# Patient Record
Sex: Male | Born: 1950
Health system: Southern US, Community
[De-identification: ages and names within clinical notes are randomized; demographics above are authoritative.]

## PROBLEM LIST (undated history)

## (undated) DIAGNOSIS — N3941 Urge incontinence: Secondary | ICD-10-CM

## (undated) DIAGNOSIS — E291 Testicular hypofunction: Secondary | ICD-10-CM

## (undated) DIAGNOSIS — G35 Multiple sclerosis: Secondary | ICD-10-CM

## (undated) DIAGNOSIS — N209 Urinary calculus, unspecified: Secondary | ICD-10-CM

## (undated) DIAGNOSIS — R399 Unspecified symptoms and signs involving the genitourinary system: Secondary | ICD-10-CM

## (undated) DIAGNOSIS — K219 Gastro-esophageal reflux disease without esophagitis: Secondary | ICD-10-CM

## (undated) HISTORY — DX: Gastro-esophageal reflux disease without esophagitis: K21.9

## (undated) HISTORY — DX: Urinary calculus, unspecified: N20.9

## (undated) HISTORY — DX: Unspecified symptoms and signs involving the genitourinary system: R39.9

## (undated) HISTORY — PX: VASECTOMY: SHX75

## (undated) HISTORY — DX: Urge incontinence: N39.41

## (undated) HISTORY — PX: APPENDECTOMY: SHX54

## (undated) HISTORY — DX: Testicular hypofunction: E29.1

---

## 2003-07-04 ENCOUNTER — Ambulatory Visit (HOSPITAL_COMMUNITY): Admission: RE | Admit: 2003-07-04 | Discharge: 2003-07-04 | Payer: Self-pay | Admitting: Gastroenterology

## 2003-07-04 ENCOUNTER — Encounter (INDEPENDENT_AMBULATORY_CARE_PROVIDER_SITE_OTHER): Payer: Self-pay

## 2003-12-24 DIAGNOSIS — G35 Multiple sclerosis: Secondary | ICD-10-CM

## 2003-12-24 HISTORY — DX: Multiple sclerosis: G35

## 2004-01-12 ENCOUNTER — Encounter: Admission: RE | Admit: 2004-01-12 | Discharge: 2004-01-12 | Payer: Self-pay | Admitting: Family Medicine

## 2004-04-20 ENCOUNTER — Encounter: Admission: RE | Admit: 2004-04-20 | Discharge: 2004-04-20 | Payer: Self-pay | Admitting: Psychiatry

## 2006-02-27 ENCOUNTER — Encounter: Payer: Self-pay | Admitting: Emergency Medicine

## 2006-02-28 ENCOUNTER — Observation Stay (HOSPITAL_COMMUNITY): Admission: EM | Admit: 2006-02-28 | Discharge: 2006-03-01 | Payer: Self-pay | Admitting: Internal Medicine

## 2006-05-15 ENCOUNTER — Encounter: Admission: RE | Admit: 2006-05-15 | Discharge: 2006-05-15 | Payer: Self-pay | Admitting: Orthopedic Surgery

## 2006-08-22 ENCOUNTER — Encounter: Admission: RE | Admit: 2006-08-22 | Discharge: 2006-08-22 | Payer: Self-pay | Admitting: Psychiatry

## 2007-07-24 ENCOUNTER — Ambulatory Visit: Payer: Self-pay | Admitting: Family Medicine

## 2007-07-24 ENCOUNTER — Telehealth (INDEPENDENT_AMBULATORY_CARE_PROVIDER_SITE_OTHER): Payer: Self-pay | Admitting: *Deleted

## 2007-07-24 DIAGNOSIS — R3915 Urgency of urination: Secondary | ICD-10-CM

## 2007-07-24 DIAGNOSIS — G35 Multiple sclerosis: Secondary | ICD-10-CM

## 2007-07-24 LAB — CONVERTED CEMR LAB
Cholesterol: 215 mg/dL (ref 0–200)
Direct LDL: 161.1 mg/dL
Glucose, Bld: 105 mg/dL — ABNORMAL HIGH (ref 70–99)
HDL: 33.3 mg/dL — ABNORMAL LOW (ref >39.0)
Total CHOL/HDL Ratio: 6.5
Triglycerides: 90 mg/dL (ref 0–149)
VLDL: 18 mg/dL (ref 0–40)

## 2007-08-31 ENCOUNTER — Encounter (INDEPENDENT_AMBULATORY_CARE_PROVIDER_SITE_OTHER): Payer: Self-pay | Admitting: Family Medicine

## 2007-09-23 ENCOUNTER — Encounter (INDEPENDENT_AMBULATORY_CARE_PROVIDER_SITE_OTHER): Payer: Self-pay | Admitting: Family Medicine

## 2007-10-12 ENCOUNTER — Encounter (INDEPENDENT_AMBULATORY_CARE_PROVIDER_SITE_OTHER): Payer: Self-pay | Admitting: Family Medicine

## 2007-10-16 ENCOUNTER — Encounter (INDEPENDENT_AMBULATORY_CARE_PROVIDER_SITE_OTHER): Payer: Self-pay | Admitting: Family Medicine

## 2007-10-25 ENCOUNTER — Encounter: Admission: RE | Admit: 2007-10-25 | Discharge: 2007-10-25 | Payer: Self-pay | Admitting: Orthopedic Surgery

## 2007-11-11 ENCOUNTER — Encounter (INDEPENDENT_AMBULATORY_CARE_PROVIDER_SITE_OTHER): Payer: Self-pay | Admitting: Family Medicine

## 2008-03-03 ENCOUNTER — Encounter (INDEPENDENT_AMBULATORY_CARE_PROVIDER_SITE_OTHER): Payer: Self-pay | Admitting: Family Medicine

## 2008-04-05 ENCOUNTER — Encounter: Payer: Self-pay | Admitting: Internal Medicine

## 2008-06-06 ENCOUNTER — Encounter: Payer: Self-pay | Admitting: Internal Medicine

## 2008-10-04 ENCOUNTER — Encounter: Payer: Self-pay | Admitting: Internal Medicine

## 2009-01-05 ENCOUNTER — Encounter: Payer: Self-pay | Admitting: Internal Medicine

## 2009-01-12 ENCOUNTER — Encounter: Admission: RE | Admit: 2009-01-12 | Discharge: 2009-01-12 | Payer: Self-pay | Admitting: Psychiatry

## 2009-01-19 ENCOUNTER — Encounter: Admission: RE | Admit: 2009-01-19 | Discharge: 2009-01-19 | Payer: Self-pay | Admitting: Psychiatry

## 2009-04-11 ENCOUNTER — Encounter: Payer: Self-pay | Admitting: Internal Medicine

## 2009-08-09 ENCOUNTER — Emergency Department (HOSPITAL_BASED_OUTPATIENT_CLINIC_OR_DEPARTMENT_OTHER): Admission: EM | Admit: 2009-08-09 | Discharge: 2009-08-09 | Payer: Self-pay | Admitting: Emergency Medicine

## 2009-08-09 ENCOUNTER — Ambulatory Visit: Payer: Self-pay | Admitting: Diagnostic Radiology

## 2009-12-23 DIAGNOSIS — N209 Urinary calculus, unspecified: Secondary | ICD-10-CM

## 2009-12-23 HISTORY — DX: Urinary calculus, unspecified: N20.9

## 2010-04-04 ENCOUNTER — Encounter (INDEPENDENT_AMBULATORY_CARE_PROVIDER_SITE_OTHER): Payer: Self-pay | Admitting: Internal Medicine

## 2010-04-04 ENCOUNTER — Inpatient Hospital Stay (HOSPITAL_COMMUNITY): Admission: EM | Admit: 2010-04-04 | Discharge: 2010-04-13 | Payer: Self-pay | Admitting: Emergency Medicine

## 2010-04-09 ENCOUNTER — Encounter (INDEPENDENT_AMBULATORY_CARE_PROVIDER_SITE_OTHER): Payer: Self-pay | Admitting: Internal Medicine

## 2010-09-30 ENCOUNTER — Ambulatory Visit: Payer: Self-pay | Admitting: Diagnostic Radiology

## 2010-09-30 ENCOUNTER — Emergency Department (HOSPITAL_BASED_OUTPATIENT_CLINIC_OR_DEPARTMENT_OTHER): Admission: EM | Admit: 2010-09-30 | Discharge: 2010-09-30 | Payer: Self-pay | Admitting: Emergency Medicine

## 2010-10-05 ENCOUNTER — Ambulatory Visit: Payer: Self-pay | Admitting: Internal Medicine

## 2010-10-05 ENCOUNTER — Encounter: Payer: Self-pay | Admitting: Internal Medicine

## 2010-10-05 DIAGNOSIS — N209 Urinary calculus, unspecified: Secondary | ICD-10-CM | POA: Insufficient documentation

## 2010-10-05 LAB — CONVERTED CEMR LAB
Bilirubin Urine: NEGATIVE
Glucose, Urine, Semiquant: NEGATIVE
Ketones, urine, test strip: NEGATIVE
Nitrite: NEGATIVE
Protein, U semiquant: NEGATIVE
Specific Gravity, Urine: 1.015
Urobilinogen, UA: 0.2
WBC Urine, dipstick: NEGATIVE
pH: 6.5

## 2010-10-06 ENCOUNTER — Encounter: Payer: Self-pay | Admitting: Internal Medicine

## 2010-10-11 LAB — CONVERTED CEMR LAB
Bacteria, UA: NONE SEEN
Casts: NONE SEEN /lpf
Squamous Epithelial / HPF: NONE SEEN /lpf

## 2010-10-12 ENCOUNTER — Telehealth: Payer: Self-pay | Admitting: Internal Medicine

## 2010-10-15 ENCOUNTER — Encounter: Admission: RE | Admit: 2010-10-15 | Discharge: 2010-10-15 | Payer: Self-pay | Admitting: Internal Medicine

## 2010-11-14 ENCOUNTER — Encounter: Payer: Self-pay | Admitting: Internal Medicine

## 2010-11-30 ENCOUNTER — Ambulatory Visit: Payer: Self-pay | Admitting: Internal Medicine

## 2010-11-30 DIAGNOSIS — K219 Gastro-esophageal reflux disease without esophagitis: Secondary | ICD-10-CM

## 2011-01-18 ENCOUNTER — Ambulatory Visit
Admission: RE | Admit: 2011-01-18 | Discharge: 2011-01-18 | Payer: Self-pay | Source: Home / Self Care | Attending: Internal Medicine | Admitting: Internal Medicine

## 2011-01-18 ENCOUNTER — Other Ambulatory Visit: Payer: Self-pay | Admitting: Internal Medicine

## 2011-01-18 LAB — LIPID PANEL
Cholesterol: 207 mg/dL — ABNORMAL HIGH (ref 0–200)
Total CHOL/HDL Ratio: 7
Triglycerides: 146 mg/dL (ref 0.0–149.0)
VLDL: 29.2 mg/dL (ref 0.0–40.0)

## 2011-01-18 LAB — TSH: TSH: 0.95 u[IU]/mL (ref 0.35–5.50)

## 2011-01-18 LAB — BASIC METABOLIC PANEL
BUN: 22 mg/dL (ref 6–23)
CO2: 29 mEq/L (ref 19–32)
Calcium: 9.9 mg/dL (ref 8.4–10.5)
Chloride: 108 mEq/L (ref 96–112)
Creatinine, Ser: 0.9 mg/dL (ref 0.4–1.5)
GFR: 93.3 mL/min (ref >60.00)
Glucose, Bld: 78 mg/dL (ref 70–99)
Potassium: 4.6 mEq/L (ref 3.5–5.1)
Sodium: 143 mEq/L (ref 135–145)

## 2011-01-18 LAB — CBC WITH DIFFERENTIAL/PLATELET
Basophils Absolute: 0 10*3/uL (ref 0.0–0.1)
Basophils Relative: 0.3 % (ref 0.0–3.0)
Eosinophils Absolute: 0.1 10*3/uL (ref 0.0–0.7)
Eosinophils Relative: 1.2 % (ref 0.0–5.0)
Hemoglobin: 14.7 g/dL (ref 13.0–17.0)
Lymphs Abs: 1.1 10*3/uL (ref 0.7–4.0)
MCHC: 35 g/dL (ref 30.0–36.0)
MCV: 85 fl (ref 78.0–100.0)
Monocytes Absolute: 0.8 10*3/uL (ref 0.1–1.0)
Monocytes Relative: 11.7 % (ref 3.0–12.0)
Neutrophils Relative %: 69.6 % (ref 43.0–77.0)
Platelets: 186 10*3/uL (ref 150.0–400.0)
RBC: 4.94 Mil/uL (ref 4.22–5.81)
RDW: 13.5 % (ref 11.5–14.6)
WBC: 6.7 10*3/uL (ref 4.5–10.5)

## 2011-01-18 LAB — LDL CHOLESTEROL, DIRECT: Direct LDL: 158.6 mg/dL

## 2011-01-18 LAB — ALT: ALT: 25 U/L (ref 0–53)

## 2011-01-22 NOTE — Assessment & Plan Note (Signed)
Summary: ROV /cbs   Vital Signs:  Patient profile:   60 year old male Height:      72 inches Weight:      200.13 pounds BMI:     27.24 Pulse rate:   78 / minute Pulse rhythm:   regular BP sitting:   124 / 78  (left arm) Cuff size:   large  Vitals Entered By: Army Fossa CMA (October 05, 2010 1:19 PM) CC: CPX, fasting Comments flu shot [facility] [address]   History of Present Illness: this is the first time I see Stephen Wade, he is here with his wife. This was supposed to be a CPX however the patient has other problems, would do a CPX at a  later time  He reports a one-year history of left-sided chest pain the pain is on and off, episodes may last hours, there is days  or even weeks when he has no symptoms at all. They are usually triggered by lying down. No associated nausea, vomiting, diarrhea or palpitations Often times associated with acid reflux He reports he had a stress test a year ago [facility] and it was normal  5 days ago he had hematuria, went to the ER, diagnosed with urolithiasis labs are reviewed from 09-30-10 creatinine 1.2 was 0.8 in 4- 2011) CBC showed white count of 8.2, hemoglobin 15.5, platelets 189 UA showed many bacteria A CT of the abdomen showed: 4 x 6 mm stone in the distal left ureter causes mild to moderate  secondary changes in the left kidney.  Other bilateral renal calculi are noted and there are numerous tiny stones or high density debris within the bladder.  additionally, late  on 10-9-11he was in the bathroom trying to urinate, suddenly he fell and landed on his back, wife was by his side immediately, he had a " blank stare". This lasted a few seconds, the patient does not believe that he lost consciousness, thinks the bathroom was dark, it was  late  and he was very tired. At the time there was no chest pain, palpitations or seizure activity   Preventive Screening-Counseling & Management  Alcohol-Tobacco     Smoking Status:  quit     Year Quit: 97  Current Medications (verified): 1)  Betaseron 0.3 Mg  Solr (Interferon Beta-1b) 2)  Azo-Cranberry 450 Mg  Tabs (Cranberry) 3)  Ampyra 10 Mg Xr12h-Tab (Dalfampridine) .... 2 A Day 4)  Gelnique 10 % Gel (Oxybutynin Chloride) .... Once Daily. 5)  Vitamin D 6)  Advil .... 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 GI:  Denies bloody stools and diarrhea.  Physical Exam  General:  alert and well-developed.   Neck:  no masses, no thyromegaly, and normal carotid upstroke.   Lungs:  normal respiratory effort, no intercostal retractions, no accessory muscle use, and normal breath sounds.   Heart:  normal rate, regular rhythm, no murmur, and no gallop.   Abdomen:  soft, non-tender, no distention, no masses, no guarding, and  no rigidity.   Extremities:  no edema   Impression & Recommendations:  Problem # 1:  CHEST PAIN (ICD-786.50) chest pain for one year, on and off, negative stress test a year ago. Only associated symptom is heartburn. Plan: Trial with Prilosec (does not interact with any of the  MS medications)  Orders: EKG w/ Interpretation (93000)  Problem # 2:  SYNCOPE (ICD-780.2) syncope in the setting of a  kidney stone dx hours prior  unclear if the patient actually lost consciousness, patient believes he didn't  fall? vasovagal? Stress test a year ago negative EKG today within normal  limits pt reluctant to do anything about this, we agreed to observe for now, will call if anything changes    Problem # 3:  URINARY CALCULUS (ICD-592.9) CT 09-30-10  4 x 6 mm stone in the distal left ureter causes mild to moderate  secondary changes in the left kidney. Other bilateral renal calculi are noted and there are numerous tiny stones or high density debris within the bladder. he did not have pain at the time of the ER eval, has no pain now unsure if he passed a stone yet plans    to see urology next month as planned will check a UCX   Orders: T-Urine Microscopic ([id number]) T-Culture, Urine (16109-60454) UA Dipstick w/o Micro (automated)  (81003)  Complete Medication List: 1)  Betaseron 0.3 Mg Solr (Interferon beta-1b) 2)  Azo-cranberry 450 Mg Tabs (Cranberry) 3)  Ampyra 10 Mg Xr12h-tab (Dalfampridine) .... 2 a day 4)  Gelnique 10 % Gel (Oxybutynin chloride) .... Once daily. 5)  Vitamin D  6)  Advil  .... As needed  Other Orders: Admin 1st Vaccine (09811) Flu Vaccine 37yrs + [phone])  Patient Instructions: 1)  Please schedule a follow-up appointment in 3 months,  fasting, physical exam Flu Vaccine Consent Questions     Do you have a history of severe allergic reactions to this vaccine? no    Any prior history of allergic reactions to egg and/or gelatin? no    Do you have a sensitivity to the preservative Thimersol? no    Do you have a past history of Guillan-Barre Syndrome? no    Do you currently have an acute febrile illness? no    Have you ever had a severe reaction to latex? no    Vaccine information given and explained to patient? yes    Are you currently pregnant? no    Lot Number:[id number]   Exp Date:06/22/2011   Site Given  Left Deltoid IMlu   Risk Factors:  Tobacco use:  quit    Year quit:  97  Laboratory Results   Urine Tests    Routine Urinalysis   Color: yellow Appearance: Clear Glucose: negative   (Normal Range: Negative) Bilirubin:  negative   (Normal Range: Negative) Ketone: negative   (Normal Range: Negative) Spec. Gravity: 1.015   (Normal Range: 1.003-1.035) Blood: large   (Normal Range: Negative) pH: 6.5   (Normal Range: 5.0-8.0) Protein: negative   (Normal Range: Negative) Urobilinogen: 0.2   (Normal Range: 0-1) Nitrite: negative   (Normal Range: Negative) Leukocyte Esterace: negative   (Normal Range: Negative)    Comments: Army Fossa CMA  October 05, 2010 2:33 PM

## 2011-01-22 NOTE — Letter (Signed)
Summary: stones, Rx observation ---- [facility]   Imported By: Lanelle Bal 11/26/2010 12:45:42  _____________________________________________________________________  External Attachment:    Type:   Image     Comment:   External Document

## 2011-01-22 NOTE — Progress Notes (Signed)
Summary: CT and u/s  Phone Note Call from Patient Call back at Home Phone [phone] Call back at [phone]   Summary of Call: I spoke with patient spouse and made her aware that MD still wants the patient to have the u/s and he has already reviewed the CT done at [facility].  She is not sure if he is aware of his appt, so she will have him call the office to discuss if he is not aware. Initial call taken by: Lucious Groves CMA,  October 12, 2010 2:31 PM

## 2011-01-24 NOTE — Assessment & Plan Note (Signed)
Summary: cpx///sph   (10/05/10 was not a CPX)   Vital Signs:  Patient profile:   60 year old male Height:      72 inches Weight:      204 pounds Pulse rate:   78 / minute Pulse rhythm:   regular BP sitting:   130 / 86  (left arm) Cuff size:   large  Vitals Entered By: Army Fossa CMA (November 30, 2010 1:11 PM) CC: CPX, not fasting  Comments [facility] [place] [facility]   History of Present Illness: CPX started prilosec for CP--  CP gone   Current Medications (verified): 1)  Betaseron 0.3 Mg  Solr (Interferon Beta-1b) 2)  Azo-Cranberry 450 Mg  Tabs (Cranberry) 3)  Ampyra 10 Mg Xr12h-Tab (Dalfampridine) .... 2 A Day 4)  Gelnique 10 % Gel (Oxybutynin Chloride) .... Once Daily. 5)  Vitamin D 6)  Advil .... As Needed 7)  Baclofen 10 Mg Tabs (Baclofen) .... Two Times A Day  Allergies (verified): No Known Drug Allergies  Past History:  Past Medical History: MS dx 2005 sees urology, Dr Patsi Sears d/t bladder issues  urolithiais 10-11 cscope at age 49 GERD--had on- off chest pain, symptoms resolved with Prilosec 11/2010  Past Surgical History: Reviewed history from 10/05/2010 and no changes required. Appendectomy, ruptured 03-2010 Vasectomy  Family History: Reviewed history from 10/05/2010 and no changes required. 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 CV:  Denies palpitations and swelling of feet; he used to have chest pain, symptoms gone  since he started Prilosec. Resp:  Denies cough and shortness of breath. GI:  no dysphagia but chocked once 3 weeks ago , wife had to perform the Heimlich  manouver (was eating dry pork) no cough w/ po. GU:  complaining of urgency, saw his urologist recently, prescribe  Flomax. He is doing much better. Psych:  Denies anxiety and depression.  Physical Exam  General:  alert, well-developed, and well-nourished.   Neck:  no masses, no thyromegaly  Lungs:  normal respiratory effort, no intercostal retractions, no accessory muscle use, and normal breath sounds.   Heart:  normal rate, regular rhythm, no murmur, and no gallop.   Rectal:  declined  Extremities:  no pretibial edema bilaterally  Psych:  Oriented X3, memory intact for recent and remote, normally interactive, good eye contact, not anxious appearing, and not depressed appearing.     Impression & Recommendations:  Problem # 1:  WELL ADULT EXAM (ICD-V70.0)  Td 2008 had a flu shot pneumonia shot-- declined , likes to discuss w/ neurology  Cscope age 52y/o , 1 polyp, does not recall who did it, was told to repeat 3 or 5 years? plan: refer to GI  Labs diet discussed  Orders: Gastroenterology Referral (GI)  Problem # 2:  CHEST PAIN (ICD-786.50) resolved after he started Prilosec. Plan: Prilosec for 2 months, then as needed  Problem # 3:  MULTIPLE SCLEROSIS (ICD-340) patient choked few weeks ago requiring the Heimlich  maneuver Recommend swallow study patient states he likes to wait and talk w/ neurology  Complete Medication List: 1)  Betaseron 0.3 Mg Solr (Interferon beta-1b) 2)  Azo-cranberry 450 Mg Tabs (Cranberry) 3)  Ampyra 10 Mg Xr12h-tab (Dalfampridine) .... 2 a day 4)  Gelnique 10 % Gel (Oxybutynin chloride) .Marland KitchenMarland KitchenMarland Kitchen  Once daily. 5)  Vitamin D  6)  Advil  .... As needed 7)  Baclofen 10 Mg Tabs (Baclofen) .... Two times a day 8)  Flomax 0.4 Mg Caps (Tamsulosin hcl) .Marland Kitchen.. 1 a day  Patient Instructions: 1)  Prilosec for 2 months, then as needed 2)  please come back fasting 3)  FLP, BMP, CBC, AST, ALT, PSA, TSH---dx v70 4)  Please schedule a follow-up appointment in 1 year.    Orders Added: 1)  Gastroenterology Referral [GI] 2)  Est. Patient age 74-64 [80]     Risk  Factors:  Alcohol use:  no

## 2011-01-30 NOTE — Assessment & Plan Note (Signed)
Summary: just blood work---//PH   Vital Signs:  Patient profile:   60 year old male Height:      72 inches (182.88 cm) Weight:      208 pounds (94.55 kg) BMI:     28.31 Temp:     98.0 degrees F (36.67 degrees C) oral BP sitting:   140 / 78  (left arm)  Vitals Entered By: Lucious Groves CMA (January 18, 2011 1:03 PM) CC: CPX./kb   History of Present Illness: pt just needs bloodwork- no charge   Current Medications (verified): 1)  Betaseron 0.3 Mg  Solr (Interferon Beta-1b) 2)  Azo-Cranberry 450 Mg  Tabs (Cranberry) 3)  Ampyra 10 Mg Xr12h-Tab (Dalfampridine) .... 2 A Day 4)  Gelnique 10 % Gel (Oxybutynin Chloride) .... Once Daily. 5)  Vitamin D 6)  Advil .... 2 By Mouth Two Times A Day 7)  Baclofen 10 Mg Tabs (Baclofen) .... Two Times A Day 8)  Flomax 0.4 Mg Caps (Tamsulosin Hcl) .Marland Kitchen.. 1 A Day 9)  Vitamin B-12 1000 Mcg Tabs (Cyanocobalamin) .Marland Kitchen.. 1 By Mouth Once Daily  Allergies (verified): No Known Drug Allergies   Complete Medication List: 1)  Betaseron 0.3 Mg Solr (Interferon beta-1b) 2)  Azo-cranberry 450 Mg Tabs (Cranberry) 3)  Ampyra 10 Mg Xr12h-tab (Dalfampridine) .... 2 a day 4)  Gelnique 10 % Gel (Oxybutynin chloride) .... Once daily. 5)  Vitamin D  6)  Advil  .... 2 by mouth two times a day 7)  Baclofen 10 Mg Tabs (Baclofen) .... Two times a day 8)  Flomax 0.4 Mg Caps (Tamsulosin hcl) .Marland Kitchen.. 1 a day 9)  Vitamin B-12 1000 Mcg Tabs (Cyanocobalamin) .Marland Kitchen.. 1 by mouth once daily  Other Orders: Venipuncture (16109) Specimen Handling (60454) TLB-Lipid Panel (80061-LIPID) TLB-BMP (Basic Metabolic Panel-BMET) (80048-METABOL) TLB-CBC Platelet - w/Differential (85025-CBCD) TLB-TSH (Thyroid Stimulating Hormone) (84443-TSH) TLB-ALT (SGPT) (84460-ALT) TLB-AST (SGOT) (84450-SGOT) T-PSA Total ([address]) Specimen Handling (82956) No Charge Patient Arrived (NCPA0) (NCPA0)   Orders Added: 1)  Venipuncture [21308] 2)  Specimen Handling [99000] 3)  TLB-Lipid Panel  [80061-LIPID] 4)  TLB-BMP (Basic Metabolic Panel-BMET) [80048-METABOL] 5)  TLB-CBC Platelet - w/Differential [85025-CBCD] 6)  TLB-TSH (Thyroid Stimulating Hormone) [84443-TSH] 7)  TLB-ALT (SGPT) [84460-ALT] 8)  TLB-AST (SGOT) [84450-SGOT] 9)  T-PSA Total [[address]] 10)  Specimen Handling [99000] 11)  No Charge Patient Arrived (NCPA0) [NCPA0]

## 2011-01-31 ENCOUNTER — Other Ambulatory Visit: Payer: Self-pay | Admitting: Internal Medicine

## 2011-01-31 ENCOUNTER — Other Ambulatory Visit (INDEPENDENT_AMBULATORY_CARE_PROVIDER_SITE_OTHER): Payer: 59

## 2011-01-31 ENCOUNTER — Encounter (INDEPENDENT_AMBULATORY_CARE_PROVIDER_SITE_OTHER): Payer: Self-pay | Admitting: *Deleted

## 2011-01-31 DIAGNOSIS — Z0000 Encounter for general adult medical examination without abnormal findings: Secondary | ICD-10-CM

## 2011-01-31 LAB — PSA: PSA: 0.51 ng/mL (ref 0.10–4.00)

## 2011-02-28 ENCOUNTER — Other Ambulatory Visit: Payer: Self-pay | Admitting: Gastroenterology

## 2011-03-07 LAB — DIFFERENTIAL
Basophils Absolute: 0.1 10*3/uL (ref 0.0–0.1)
Basophils Relative: 1 % (ref 0–1)
Eosinophils Absolute: 0 10*3/uL (ref 0.0–0.7)
Eosinophils Relative: 0 % (ref 0–5)
Monocytes Absolute: 1 10*3/uL (ref 0.1–1.0)
Monocytes Relative: 12 % (ref 3–12)

## 2011-03-07 LAB — CBC
HCT: 45.5 % (ref 39.0–52.0)
Hemoglobin: 15.5 g/dL (ref 13.0–17.0)
MCH: 28.8 pg (ref 26.0–34.0)
MCHC: 34 g/dL (ref 30.0–36.0)
MCV: 84.6 fL (ref 78.0–100.0)
RDW: 12.9 % (ref 11.5–15.5)

## 2011-03-07 LAB — URINALYSIS, ROUTINE W REFLEX MICROSCOPIC
Bilirubin Urine: NEGATIVE
Ketones, ur: NEGATIVE mg/dL
Nitrite: NEGATIVE
Urobilinogen, UA: 0.2 mg/dL (ref 0.0–1.0)
pH: 6.5 (ref 5.0–8.0)

## 2011-03-07 LAB — BASIC METABOLIC PANEL
BUN: 18 mg/dL (ref 6–23)
CO2: 28 mEq/L (ref 19–32)
Chloride: 104 mEq/L (ref 96–112)
GFR calc non Af Amer: >60 mL/min (ref >60)
Glucose, Bld: 112 mg/dL — ABNORMAL HIGH (ref 70–99)
Potassium: 4.6 mEq/L (ref 3.5–5.1)
Sodium: 143 mEq/L (ref 135–145)

## 2011-03-07 LAB — URINE MICROSCOPIC-ADD ON

## 2011-03-12 LAB — CBC
HCT: 32.2 % — ABNORMAL LOW (ref 39.0–52.0)
HCT: 34.1 % — ABNORMAL LOW (ref 39.0–52.0)
HCT: 34.4 % — ABNORMAL LOW (ref 39.0–52.0)
HCT: 35.4 % — ABNORMAL LOW (ref 39.0–52.0)
HCT: 37 % — ABNORMAL LOW (ref 39.0–52.0)
HCT: 39.8 % (ref 39.0–52.0)
Hemoglobin: 11.5 g/dL — ABNORMAL LOW (ref 13.0–17.0)
Hemoglobin: 12.1 g/dL — ABNORMAL LOW (ref 13.0–17.0)
Hemoglobin: 12.1 g/dL — ABNORMAL LOW (ref 13.0–17.0)
Hemoglobin: 12.3 g/dL — ABNORMAL LOW (ref 13.0–17.0)
Hemoglobin: 12.5 g/dL — ABNORMAL LOW (ref 13.0–17.0)
Hemoglobin: 12.8 g/dL — ABNORMAL LOW (ref 13.0–17.0)
Hemoglobin: 13.7 g/dL (ref 13.0–17.0)
MCHC: 34.4 g/dL (ref 30.0–36.0)
MCHC: 34.5 g/dL (ref 30.0–36.0)
MCHC: 35.1 g/dL (ref 30.0–36.0)
MCHC: 35.3 g/dL (ref 30.0–36.0)
MCHC: 35.7 g/dL (ref 30.0–36.0)
MCHC: 36 g/dL (ref 30.0–36.0)
MCV: 86.5 fL (ref 78.0–100.0)
MCV: 87.1 fL (ref 78.0–100.0)
MCV: 87.2 fL (ref 78.0–100.0)
MCV: 87.3 fL (ref 78.0–100.0)
MCV: 87.6 fL (ref 78.0–100.0)
MCV: 88.6 fL (ref 78.0–100.0)
Platelets: 187 K/uL (ref 150–400)
Platelets: 203 K/uL (ref 150–400)
Platelets: 253 K/uL (ref 150–400)
Platelets: 299 K/uL (ref 150–400)
Platelets: 366 10*3/uL (ref 150–400)
Platelets: 454 K/uL — ABNORMAL HIGH (ref 150–400)
Platelets: 505 K/uL — ABNORMAL HIGH (ref 150–400)
RBC: 3.69 MIL/uL — ABNORMAL LOW (ref 4.22–5.81)
RBC: 3.94 MIL/uL — ABNORMAL LOW (ref 4.22–5.81)
RBC: 3.94 MIL/uL — ABNORMAL LOW (ref 4.22–5.81)
RBC: 4.05 MIL/uL — ABNORMAL LOW (ref 4.22–5.81)
RBC: 4.18 MIL/uL — ABNORMAL LOW (ref 4.22–5.81)
RBC: 4.55 MIL/uL (ref 4.22–5.81)
RDW: 14 % (ref 11.5–15.5)
RDW: 14 % (ref 11.5–15.5)
RDW: 14 % (ref 11.5–15.5)
RDW: 14 % (ref 11.5–15.5)
RDW: 14.1 % (ref 11.5–15.5)
RDW: 14.2 % (ref 11.5–15.5)
RDW: 14.2 % (ref 11.5–15.5)
WBC: 10.7 K/uL — ABNORMAL HIGH (ref 4.0–10.5)
WBC: 11.4 K/uL — ABNORMAL HIGH (ref 4.0–10.5)
WBC: 11.7 K/uL — ABNORMAL HIGH (ref 4.0–10.5)
WBC: 11.9 K/uL — ABNORMAL HIGH (ref 4.0–10.5)
WBC: 12 10*3/uL — ABNORMAL HIGH (ref 4.0–10.5)
WBC: 12.6 K/uL — ABNORMAL HIGH (ref 4.0–10.5)
WBC: 9.6 K/uL (ref 4.0–10.5)

## 2011-03-12 LAB — BASIC METABOLIC PANEL WITH GFR
BUN: 11 mg/dL (ref 6–23)
BUN: 7 mg/dL (ref 6–23)
CO2: 25 meq/L (ref 19–32)
CO2: 25 meq/L (ref 19–32)
Calcium: 8.4 mg/dL (ref 8.4–10.5)
Calcium: 8.7 mg/dL (ref 8.4–10.5)
Chloride: 104 meq/L (ref 96–112)
Chloride: 106 meq/L (ref 96–112)
Creatinine, Ser: 0.8 mg/dL (ref 0.4–1.5)
Creatinine, Ser: 0.85 mg/dL (ref 0.4–1.5)
GFR calc non Af Amer: >60 mL/min
GFR calc non Af Amer: >60 mL/min
Glucose, Bld: 87 mg/dL (ref 70–99)
Glucose, Bld: 93 mg/dL (ref 70–99)
Potassium: 3.7 meq/L (ref 3.5–5.1)
Potassium: 4.1 meq/L (ref 3.5–5.1)
Sodium: 137 meq/L (ref 135–145)
Sodium: 141 meq/L (ref 135–145)

## 2011-03-12 LAB — URINALYSIS, ROUTINE W REFLEX MICROSCOPIC
Glucose, UA: NEGATIVE mg/dL
Ketones, ur: >80 mg/dL — AB
Leukocytes, UA: NEGATIVE
Nitrite: NEGATIVE
Protein, ur: NEGATIVE mg/dL
Specific Gravity, Urine: 1.019 (ref 1.005–1.030)
Urobilinogen, UA: 1 mg/dL (ref 0.0–1.0)
pH: 6 (ref 5.0–8.0)

## 2011-03-12 LAB — BASIC METABOLIC PANEL
BUN: 7 mg/dL (ref 6–23)
CO2: 22 mEq/L (ref 19–32)
CO2: 25 mEq/L (ref 19–32)
Calcium: 8.2 mg/dL — ABNORMAL LOW (ref 8.4–10.5)
Chloride: 100 mEq/L (ref 96–112)
Creatinine, Ser: 0.8 mg/dL (ref 0.4–1.5)
Creatinine, Ser: 1.05 mg/dL (ref 0.4–1.5)
GFR calc Af Amer: >60 mL/min (ref >60)
GFR calc non Af Amer: >60 mL/min (ref >60)
GFR calc non Af Amer: >60 mL/min (ref >60)
Glucose, Bld: 86 mg/dL (ref 70–99)
Glucose, Bld: 94 mg/dL (ref 70–99)
Potassium: 3.6 mEq/L (ref 3.5–5.1)
Potassium: 3.7 mEq/L (ref 3.5–5.1)
Sodium: 130 mEq/L — ABNORMAL LOW (ref 135–145)
Sodium: 137 mEq/L (ref 135–145)
Sodium: 138 mEq/L (ref 135–145)
Sodium: 140 mEq/L (ref 135–145)

## 2011-03-12 LAB — CLOSTRIDIUM DIFFICILE EIA: C difficile Toxins A+B, EIA: NEGATIVE

## 2011-03-12 LAB — URINE CULTURE
Colony Count: NO GROWTH
Culture: NO GROWTH

## 2011-03-12 LAB — CARDIAC PANEL(CRET KIN+CKTOT+MB+TROPI): Relative Index: 1.5 (ref 0.0–2.5)

## 2011-03-12 LAB — URINE MICROSCOPIC-ADD ON

## 2011-03-12 LAB — D-DIMER, QUANTITATIVE

## 2011-03-12 LAB — TSH: TSH: 2.231 u[IU]/mL (ref 0.350–4.500)

## 2011-03-13 LAB — BASIC METABOLIC PANEL
BUN: 14 mg/dL (ref 6–23)
CO2: 23 mEq/L (ref 19–32)
CO2: 26 mEq/L (ref 19–32)
Chloride: 101 mEq/L (ref 96–112)
Chloride: 105 mEq/L (ref 96–112)
Creatinine, Ser: 0.94 mg/dL (ref 0.4–1.5)
GFR calc Af Amer: >60 mL/min (ref >60)
GFR calc non Af Amer: 58 mL/min — ABNORMAL LOW (ref >60)
Glucose, Bld: 103 mg/dL — ABNORMAL HIGH (ref 70–99)
Potassium: 4.1 mEq/L (ref 3.5–5.1)
Sodium: 134 mEq/L — ABNORMAL LOW (ref 135–145)

## 2011-03-13 LAB — DIFFERENTIAL
Basophils Absolute: 0 10*3/uL (ref 0.0–0.1)
Eosinophils Absolute: 0 10*3/uL (ref 0.0–0.7)
Lymphocytes Relative: 4 % — ABNORMAL LOW (ref 12–46)
Lymphs Abs: 0.5 10*3/uL — ABNORMAL LOW (ref 0.7–4.0)
Neutrophils Relative %: 88 % — ABNORMAL HIGH (ref 43–77)

## 2011-03-13 LAB — CULTURE, BLOOD (ROUTINE X 2)
Culture: NO GROWTH
Culture: NO GROWTH
Culture: NO GROWTH

## 2011-03-13 LAB — CULTURE, BLOOD (SINGLE): Culture: NO GROWTH

## 2011-03-13 LAB — CBC
HCT: 40.2 % (ref 39.0–52.0)
Hemoglobin: 13.7 g/dL (ref 13.0–17.0)
MCHC: 34.1 g/dL (ref 30.0–36.0)
MCV: 87.8 fL (ref 78.0–100.0)
Platelets: 119 10*3/uL — ABNORMAL LOW (ref 150–400)
Platelets: 129 10*3/uL — ABNORMAL LOW (ref 150–400)
RDW: 14.1 % (ref 11.5–15.5)
RDW: 14.3 % (ref 11.5–15.5)
WBC: 13.7 10*3/uL — ABNORMAL HIGH (ref 4.0–10.5)

## 2011-03-13 LAB — URINALYSIS, ROUTINE W REFLEX MICROSCOPIC
Hgb urine dipstick: NEGATIVE
Nitrite: NEGATIVE
Specific Gravity, Urine: 1.028 (ref 1.005–1.030)
pH: 6 (ref 5.0–8.0)

## 2011-03-13 LAB — URINE MICROSCOPIC-ADD ON

## 2011-03-13 LAB — LACTIC ACID, PLASMA: Lactic Acid, Venous: 2.1 mmol/L (ref 0.5–2.2)

## 2011-03-13 LAB — URINE CULTURE

## 2011-03-30 LAB — POCT CARDIAC MARKERS
CKMB, poc: 1.3 ng/mL (ref 1.0–8.0)
Myoglobin, poc: 89 ng/mL (ref 12–200)
Troponin i, poc: <0.05 ng/mL (ref 0.00–0.09)

## 2011-03-30 LAB — DIFFERENTIAL
Basophils Absolute: 0.1 10*3/uL (ref 0.0–0.1)
Basophils Relative: 1 % (ref 0–1)
Lymphocytes Relative: 15 % (ref 12–46)
Monocytes Relative: 11 % (ref 3–12)
Neutro Abs: 5.5 10*3/uL (ref 1.7–7.7)
Neutrophils Relative %: 70 % (ref 43–77)

## 2011-03-30 LAB — BASIC METABOLIC PANEL
CO2: 28 mEq/L (ref 19–32)
Calcium: 10.7 mg/dL — ABNORMAL HIGH (ref 8.4–10.5)
Creatinine, Ser: 0.9 mg/dL (ref 0.4–1.5)
GFR calc Af Amer: >60 mL/min (ref >60)
GFR calc non Af Amer: >60 mL/min (ref >60)
Sodium: 142 mEq/L (ref 135–145)

## 2011-03-30 LAB — CBC
Hemoglobin: 15 g/dL (ref 13.0–17.0)
MCHC: 34.6 g/dL (ref 30.0–36.0)
RBC: 5.1 MIL/uL (ref 4.22–5.81)

## 2011-05-10 NOTE — Op Note (Signed)
   NAME:  SHERIFF, RODENBERG                           ACCOUNT NO.:  [id number]   MEDICAL RECORD NO.:  [id number]                   PATIENT TYPE:  AMB   LOCATION:  ENDO                                 FACILITY:  [facility]   PHYSICIAN:  Khaliel L. Malon Kindle., M.D.          DATE OF BIRTH:  May 05, 1951   DATE OF PROCEDURE:  07/04/2003  DATE OF DISCHARGE:                                 OPERATIVE REPORT   PROCEDURE:  Colonoscopy and polypectomy.   MEDICATIONS:  1. Fentanyl 112.5 mcg.  2. Versed 10 mg IV.   SCOPE:  Adult scope switching over to pediatric due to inability to pass  sigmoid colon.   DESCRIPTION OF PROCEDURE:  The procedure had been explained to the patient  and consent obtained.  With the patient in the left lateral decubitus  position, the Olympus adult scope was passed.  The patient had a tortuous  sigmoid.  I was having difficulty passing through, so I switched over to the  adjustable pediatric scope.  This would easily pass using abdominal pressure  and position changes, and we were able to reach the cecum.  The ileocecal  valve and appendiceal orifice were seen.  The prep was marginal.  There was  still some solid pieces of stool blocking the colon; particularly the cecum  was dirty, but most of this could be irrigated and seen pretty well.  A  small polyp could have been missed.  The right colon including the cecum and  ascending colon did not reveal any gross lesions.  Transverse, descending,  and sigmoid colon were seen well.  At 30 cm from the anal verge, a 0.5 cm  pedunculated polyp was encountered, removed with the snare, and sucked  through the scope.  There was no bleeding.  No other polyps were seen in the  remainder of the sigmoid and rectum.  The scope was withdrawn.  The patient  tolerated the procedure well, was maintained on low-flow oxygen and pulse  oximeter throughout the procedure.   ASSESSMENT:  Sigmoid colon polyps, snared and removed.   PLAN:  1.  Routine postpolypectomy instructions.  2. We will recommend repeating procedure in three years.                                               Denson L. Malon Kindle., M.D.    Waldron Session  D:  07/04/2003  T:  07/04/2003  Job:  [id number]   cc:   Thelma Barge P. Modesto Charon, M.D.  [address]  [place]  [place] [address]  Fax: [phone]

## 2011-05-10 NOTE — Discharge Summary (Signed)
Stephen Wade, Stephen Wade                 ACCOUNT NO.:  [id number]   MEDICAL RECORD NO.:  [id number]          PATIENT TYPE:  INP   LOCATION:  5019                         FACILITY:  [facility]   PHYSICIAN:  Jackie Plum, M.D.DATE OF BIRTH:  1951-05-08   DATE OF ADMISSION:  02/28/2006  DATE OF DISCHARGE:  03/01/2006                                 DISCHARGE SUMMARY   DISCHARGE DIAGNOSES:  1.  Urosepsis, resolved.  2.  Dehydration, resolved.  3.  History of multiple sclerosis.   DISCHARGE MEDICATIONS:  1.  Ciprofloxacin 500 mg twice daily from tomorrow.  (The patient is to      receive a second dose of Cipro today before he leaves the hospital.)  2.  He is going to continue his Baclofen, __________  , prednisone,      __________  and Zantac as previously.   CONSULTATIONS:  Not applicable.   PROCEDURE:  Not applicable.   CONDITION ON DISCHARGE:  Improved and satisfactory.   REASON FOR ADMISSION:  Urosepsis.   The patient was admitted by Dr. Tresa Endo, on February 28, 2006, after presenting  to the ED with weakness in his urinary stream, urgency, and dysuria.  He  also had some chills.  He had had a fever at home as well.  In the emergency  room, the patient's temperature was 101.7 degrees Fahrenheit.  He was  tachycardic at 120 with a BP of 138/76.  The patient was seen and examined  by Dr. Sherin Quarry with exam indicating mild tachycardia on cardiac  auscultation, pulmonary auscultation was unremarkable.  His UA was positive  for urinary infection and the patient was admitted for urosepsis.  Aggressive antibiotics with fluid supplementation was initiated.  And the  patient did very well overnight.  Indeed, his temperature has remained  within normal limits throughout his admission and had been feeling great  since yesterday.  On rounds today, the patient feels back to his baseline.  He does not have any problems.  His appetite is good.  He does not have any  fever, chills, weakness, and  he feels good enough to go home.  His blood  cultures had grown E. coli in one set and his urine culture had not grown  anything as yet.  His leukocytosis, which peaked at 21,600, had decreased to  13,800.  On account of the fact that this man is relatively young and seems  to be very well educated and understands his overall medical condition and  the fact that he has been doing great, we feel that with his great response  to antibiotics to be fairly okay to send him home on p.o. antibiotics with  education that he needs to come back to the ED or the family doctor should  he have any recurrence of his symptoms including fever, chills, weakness,  decreased appetite, amongst others.  He expressed understanding.   PHYSICAL EXAMINATION:  VITAL SIGNS:  His discharge vitals indicate a BP of  118/54, pulse 81, respirations 20, temperature 97.5 degrees Fahrenheit, 99%  on room air.  CARDIOPULMONARY:  Auscultations are  unremarkable.  ABDOMEN:  Soft and nontender.  EXTREMITIES:  No cyanosis, no edema.   His catheter which was put in has been ordered to be removed and he is  discharged in stable satisfactory condition.   He is going to be on antibiotics for 10 more days of Cipro to treat his  urosepsis to completion.  He is to follow up with his PCP, Dr. Modesto Charon, in 7 to  10 days, earlier if there is any problems.  And also [name] has  planned on March 10, 2006.      Jackie Plum, M.D.  Electronically Signed     GO/MEDQ  D:  03/01/2006  T:  03/01/2006  Job:  [id number]   cc:   Vonzell Schlatter. Patsi Sears, M.D.  Fax: [phone]   Modesto Charon, Dr.  [facility]

## 2011-05-10 NOTE — H&P (Signed)
[name], Wade NO.:  [id number]   MEDICAL RECORD NO.:  [id number]          PATIENT TYPE:  INP   LOCATION:  5019                         FACILITY:  [facility]   PHYSICIAN:  Sherin Quarry, MD      DATE OF BIRTH:  [birth year]   DATE OF ADMISSION:  02/28/2006  DATE OF DISCHARGE:                                HISTORY & PHYSICAL   Stephen Wade is a 60 year old man who was diagnosed with multiple  sclerosis about 2 years ago. Mr. Crevier says that yesterday he was evaluated  in Dr. Imelda Pillow office because of difficulty emptying his bladder and  weak urinary stream. The evaluation involved placing a Foley catheter. Today  he noted increased urinary frequency, urgency and dysuria. Tonight at about  5 p.m. he experienced an episode of bad chills. In addition, over the course  of the day he has noted recurrence of right leg weakness which is a symptom  that he develops it is attributed to his multiple sclerosis. He had  contacted Dr. Kathrynn Ducking and explained his presentation. Dr. Kathrynn Ducking  recommended that he start a tapering schedule of prednisone and he took six  tablets of prednisone today. He is not sure if these are 5 or 10 mg tablets.  He has a dull headache. He denies coughing, wheezing or shortness of breath.  He is having no chest pain, no abdominal pain. He had one episode of  vomiting today. Currently he has a Foley catheter in place but he says that  he had a feeling of incomplete bladder emptying earlier. On arrival to the  emergency room his temperature was 101.7, blood pressure 138/76, pulse was  120, respirations 20, O2 saturation 94%. Laboratory studies obtained  included a creatinine of 1.1; BUN of 14; white count of 12,300. The  microscopic urine exam reveals 20 white cells and many bacteria. The patient  is admitted at this time for treatment of presumed urinary tract infection,  possibly with sepsis.   PAST MEDICAL HISTORY:  Current medications:  The  patient is taking  1.  Prednisone six tablets daily.  2.  He is also taking Axid 150 mg b.i.d.  3.  He receives a Betaseron shot every-other day.  4.  Baclofen 10 mg t.i.d.   He has no known drug allergies.   Operations:  He had a vasectomy. He has had no other operations.   Medical illnesses:  The only medical illness that he has is multiple  sclerosis. This is responding well to the Betaseron. He is very functional  and continues to work and travel.   FAMILY HISTORY:  His parents are both deceased. His father died of a brain  tumor at the age of 20. His mother died of an MI in her late 14s. He has a  half-brother but does not know too much but his health history.   SOCIAL HISTORY:  He smoked very heavily until 9 years ago, when he  discontinued this practice. He has not drank any alcohol in the last 2  years. He does not take any  other medications.   REVIEW OF SYSTEMS:  HEAD:  He has a dull headache. EYES:  He denies visual  blurring or diplopia. EAR, NOSE AND THROAT:  Denies earache, sinus pain or  sore throat. CHEST:  Denies coughing, wheezing or chest congestion.  CARDIOVASCULAR:  Denies orthopnea, PND or ankle edema. GI:  See above. There  is been no hematemesis or melena. GU:  See above. NEURO:  See above. ENDO:  There is no history of excessive thirst, diabetes or thyroid disease.   PHYSICAL EXAMINATION:  VITAL SIGNS:  At this point his blood pressure is  121/71, pulse is 100, respirations 18, O2 saturation 95%.  GENERAL:  The patient is alert and oriented. He is reasonably comfortable.  HEENT:  Exam is within normal limits.  CHEST:  Clear.  BACK:  Reveals no CVA or point tenderness.  CARDIOVASCULAR:  Reveals a mild tachycardia. There are no rubs or gallops.  ABDOMEN:  Benign. There are normal bowel sounds. There are no masses or  tenderness.  NEUROLOGIC TESTING:  The patient has a subjective feeling of weakness in his  right leg, although he seems to have good muscular  strength to gross  examination.  EXTREMITIES:  Reveals no evidence of cyanosis or edema.   IMPRESSION:  1.  Urinary tract infection, possible sepsis. This was associated with      recent Foley catheterization.  2.  Dehydration.  3.  Chronic bladder emptying problem.  4.  Multiple sclerosis, prednisone therapy.   PLAN:  Blood and urine cultures have been obtained and the patient has been  started on IV Cipro 400 mg every 12 hours. Will continue prednisone as  recommended by his neurologist at [facility]. Will continue empiric H2 blocker. It  will probably be a good idea to reconsult [name] to discuss the  implications of the studies that he performed and what would be the best  action to take in regard to his incomplete bladder emptying.           ______________________________  Sherin Quarry, MD     SY/MEDQ  D:  02/28/2006  T:  02/28/2006  Job:  [id number]   cc:   Thelma Barge P. Modesto Charon, M.D.  Fax: [phone]   Alvina Chou  Fax: [phone]

## 2011-05-17 ENCOUNTER — Encounter: Payer: Self-pay | Admitting: Internal Medicine

## 2011-12-26 ENCOUNTER — Encounter: Payer: Self-pay | Admitting: Internal Medicine

## 2011-12-26 ENCOUNTER — Ambulatory Visit (INDEPENDENT_AMBULATORY_CARE_PROVIDER_SITE_OTHER): Payer: 59 | Admitting: Internal Medicine

## 2011-12-26 DIAGNOSIS — R300 Dysuria: Secondary | ICD-10-CM

## 2011-12-26 DIAGNOSIS — N400 Enlarged prostate without lower urinary tract symptoms: Secondary | ICD-10-CM | POA: Insufficient documentation

## 2011-12-26 DIAGNOSIS — R319 Hematuria, unspecified: Secondary | ICD-10-CM

## 2011-12-26 LAB — CBC WITH DIFFERENTIAL/PLATELET
Basophils Absolute: 0 10*3/uL (ref 0.0–0.1)
Eosinophils Absolute: 0.2 10*3/uL (ref 0.0–0.7)
Eosinophils Relative: 2 % (ref 0.0–5.0)
HCT: 39.6 % (ref 39.0–52.0)
Lymphs Abs: 2.3 10*3/uL (ref 0.7–4.0)
MCHC: 34.5 g/dL (ref 30.0–36.0)
MCV: 86.6 fl (ref 78.0–100.0)
Monocytes Absolute: 0.8 10*3/uL (ref 0.1–1.0)
Platelets: 231 10*3/uL (ref 150.0–400.0)
RDW: 13.9 % (ref 11.5–14.6)

## 2011-12-26 LAB — POCT URINALYSIS DIPSTICK
Nitrite, UA: NEGATIVE
Protein, UA: NEGATIVE
Spec Grav, UA: 1.015
Urobilinogen, UA: 0.2

## 2011-12-26 LAB — BASIC METABOLIC PANEL
BUN: 19 mg/dL (ref 6–23)
CO2: 28 mEq/L (ref 19–32)
Chloride: 107 mEq/L (ref 96–112)
Glucose, Bld: 94 mg/dL (ref 70–99)
Potassium: 4.7 mEq/L (ref 3.5–5.1)

## 2011-12-26 MED ORDER — CIPROFLOXACIN HCL 500 MG PO TABS: 500.0000 mg | ORAL_TABLET | Freq: Two times a day (BID) | ORAL | Status: AC

## 2011-12-26 NOTE — Patient Instructions (Signed)
lots of fluids cipro x 10 days Call if no better in few days, call if fever, chills, pain See your urologist

## 2011-12-26 NOTE — Assessment & Plan Note (Addendum)
Painless gross hematuria by history x 1 month, no bleeding anywhere else per review of systems. Udip is confirmatory of blood in the urine H/o urolithiasis Plan: UA UCX cipro If no infection is documented will need further testing , he is due to see urology , rec to call them

## 2011-12-26 NOTE — Progress Notes (Signed)
  Subjective:    Patient ID: Stephen Wade, male    DOB: 22-Jan-1951, 61 y.o.   MRN: [id number]  HPI Acute visit, here with his wife. One month history of change in the color of the urine, it looks dark. Today for the first time, it looks slightly red although he did not see fresh blood.   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 No nausea or vomiting. Denies any back pain. No blood in the stools or bleeding become He is now taking natalizumab monthly for MS    Objective:   Physical Exam  Constitutional: He appears well-developed. No distress.  Cardiovascular: Normal rate, regular rhythm and normal heart sounds.   No murmur heard. Pulmonary/Chest: Effort normal and breath sounds normal. No respiratory distress. He has no wheezes. He has no rales.  Abdominal: Soft. He exhibits no distension. There is no tenderness. There is no rebound and no guarding.       No CVA tenderness  Skin: He is not diaphoretic.       Assessment & Plan:

## 2013-03-11 ENCOUNTER — Ambulatory Visit (INDEPENDENT_AMBULATORY_CARE_PROVIDER_SITE_OTHER): Payer: 59 | Admitting: Internal Medicine

## 2013-03-11 VITALS — BP 122/68 | HR 81 | Temp 97.8°F

## 2013-03-11 DIAGNOSIS — R310 Gross hematuria: Secondary | ICD-10-CM

## 2013-03-11 DIAGNOSIS — E291 Testicular hypofunction: Secondary | ICD-10-CM

## 2013-03-11 DIAGNOSIS — R319 Hematuria, unspecified: Secondary | ICD-10-CM

## 2013-03-11 LAB — BASIC METABOLIC PANEL
CO2: 26 mEq/L (ref 19–32)
Calcium: 9.3 mg/dL (ref 8.4–10.5)
Glucose, Bld: 114 mg/dL — ABNORMAL HIGH (ref 70–99)
Potassium: 4.2 mEq/L (ref 3.5–5.1)
Sodium: 140 mEq/L (ref 135–145)

## 2013-03-11 LAB — POCT URINALYSIS DIPSTICK
Bilirubin, UA: NEGATIVE
Glucose, UA: NEGATIVE
Ketones, UA: NEGATIVE
Leukocytes, UA: NEGATIVE
Protein, UA: 100
Spec Grav, UA: 1.015

## 2013-03-11 LAB — URINALYSIS, ROUTINE W REFLEX MICROSCOPIC
Leukocytes, UA: NEGATIVE
Nitrite: POSITIVE
Total Protein, Urine: 100
pH: 6.5 (ref 5.0–8.0)

## 2013-03-11 MED ORDER — CIPROFLOXACIN HCL 500 MG PO TABS: 500.0000 mg | ORAL_TABLET | Freq: Two times a day (BID) | ORAL | Status: DC

## 2013-03-11 NOTE — Patient Instructions (Addendum)
Take antibiotics as prescribed. Hold Tizandine a while on Cipro. You are due for a checkup with your urologist. Call anytime if you have fever, chills, nausea, you get worse.

## 2013-03-11 NOTE — Assessment & Plan Note (Signed)
Was diagnosed with hypogonadism by urology, was prescribed HRT but patient is not taking

## 2013-03-11 NOTE — Progress Notes (Signed)
  Subjective:    Patient ID: Stephen Wade, male    DOB: 1951-11-16, 62 y.o.   MRN: [id number]  HPI Acute visit, here with his wife. 3 days ago noted his urine was getting dark, symptoms gradually getting worse, today he had frank gross hematuria that look like fresh blood. Otherwise feels well, denies dysuria, difficulty urinating. He has a extensive  Urological history, chart reviewed, see assessment and plan.  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 No flank pain, mild suprapubic discomfort. Reports he retains good bladder and bowel control.        Objective:   Physical Exam  General -- alert, well-developed, vital signs stable.   Lungs -- normal respiratory effort, no intercostal retractions, no accessory muscle use, and normal breath sounds.   Heart-- normal rate, regular rhythm, no murmur, and no gallop.   Abdomen--soft, non-tender, no distention, no masses, no HSM, no guarding, and no rigidity.   Extremities-- no pretibial edema bilaterally Rectal-- strongly declined Psych-- Cognition and judgment appear intact. Alert and cooperative with normal attention span and concentration.  not anxious appearing and not depressed appearing.       Assessment & Plan:

## 2013-03-11 NOTE — Assessment & Plan Note (Addendum)
Presents today with gross hematuria, essentially painless except for mild suprapubic discomfort. After a extensive chart review, the plan is: UA, urine culture, BMP Treat empirically with Cipro. If urine culture came back positive, then infection is the most likely cause for gross hematuria. Last visit with urology 06-2012, he is due for a checkup, recommend to schedule that.   Chart is reviewed,  he is well known to urology, previous workup includes CT (per patient), had a normal cystoscopy 06-2012, also had urodynamics last year. He has the following urological diagnosis. Renal cysts Microscopic hematuria Kidney stones Incomplete empty bladder with bladder neck dyssynergia

## 2013-03-12 ENCOUNTER — Encounter: Payer: Self-pay | Admitting: Internal Medicine

## 2013-03-13 LAB — URINE CULTURE: Organism ID, Bacteria: NO GROWTH

## 2013-03-15 ENCOUNTER — Telehealth: Payer: Self-pay | Admitting: *Deleted

## 2013-03-15 NOTE — Telephone Encounter (Signed)
Advised wife of results and recommendation to follow up with urologist this week.

## 2013-03-17 ENCOUNTER — Emergency Department (HOSPITAL_BASED_OUTPATIENT_CLINIC_OR_DEPARTMENT_OTHER): Payer: 59

## 2013-03-17 ENCOUNTER — Encounter (HOSPITAL_BASED_OUTPATIENT_CLINIC_OR_DEPARTMENT_OTHER): Payer: Self-pay

## 2013-03-17 ENCOUNTER — Emergency Department (HOSPITAL_BASED_OUTPATIENT_CLINIC_OR_DEPARTMENT_OTHER)
Admission: EM | Admit: 2013-03-17 | Discharge: 2013-03-17 | Disposition: A | Discharge status: Home / Self Care | Payer: 59 | Attending: Emergency Medicine | Admitting: Emergency Medicine

## 2013-03-17 DIAGNOSIS — Z8669 Personal history of other diseases of the nervous system and sense organs: Secondary | ICD-10-CM | POA: Insufficient documentation

## 2013-03-17 DIAGNOSIS — Z79899 Other long term (current) drug therapy: Secondary | ICD-10-CM | POA: Insufficient documentation

## 2013-03-17 DIAGNOSIS — R350 Frequency of micturition: Secondary | ICD-10-CM | POA: Insufficient documentation

## 2013-03-17 DIAGNOSIS — R319 Hematuria, unspecified: Secondary | ICD-10-CM

## 2013-03-17 DIAGNOSIS — N201 Calculus of ureter: Secondary | ICD-10-CM

## 2013-03-17 DIAGNOSIS — R3915 Urgency of urination: Secondary | ICD-10-CM | POA: Insufficient documentation

## 2013-03-17 HISTORY — DX: Multiple sclerosis: G35

## 2013-03-17 LAB — BASIC METABOLIC PANEL
Calcium: 9.8 mg/dL (ref 8.4–10.5)
Creatinine, Ser: 0.8 mg/dL (ref 0.50–1.35)
GFR calc Af Amer: >90 mL/min (ref >90)
GFR calc non Af Amer: >90 mL/min (ref >90)
Sodium: 141 mEq/L (ref 135–145)

## 2013-03-17 LAB — CBC WITH DIFFERENTIAL/PLATELET
Basophils Absolute: 0 10*3/uL (ref 0.0–0.1)
Basophils Relative: 0 % (ref 0–1)
Eosinophils Absolute: 0.1 10*3/uL (ref 0.0–0.7)
Eosinophils Relative: 2 % (ref 0–5)
HCT: 40.9 % (ref 39.0–52.0)
Lymphocytes Relative: 19 % (ref 12–46)
MCHC: 35.5 g/dL (ref 30.0–36.0)
MCV: 81.6 fL (ref 78.0–100.0)
Monocytes Absolute: 0.9 10*3/uL (ref 0.1–1.0)
Platelets: 194 10*3/uL (ref 150–400)
RDW: 14 % (ref 11.5–15.5)
WBC: 6.9 10*3/uL (ref 4.0–10.5)

## 2013-03-17 LAB — URINALYSIS, ROUTINE W REFLEX MICROSCOPIC
Glucose, UA: NEGATIVE mg/dL
Ketones, ur: NEGATIVE mg/dL
Protein, ur: NEGATIVE mg/dL
Urobilinogen, UA: 0.2 mg/dL (ref 0.0–1.0)

## 2013-03-17 LAB — URINE MICROSCOPIC-ADD ON

## 2013-03-17 MED ORDER — IOHEXOL 300 MG/ML  SOLN
100.0000 mL | Freq: Once | INTRAMUSCULAR | Status: AC | PRN
  Administered 2013-03-17: 100 mL via INTRAVENOUS

## 2013-03-17 MED ORDER — NAPROXEN 500 MG PO TABS: 500.0000 mg | ORAL_TABLET | Freq: Two times a day (BID) | ORAL | Status: DC

## 2013-03-17 NOTE — ED Notes (Signed)
Urine that was sent to the lab is brought in from home.

## 2013-03-17 NOTE — ED Provider Notes (Signed)
History     CSN: [id number]  Arrival date & time 03/17/13  1001   First MD Initiated Contact with Patient 03/17/13 1108      Chief Complaint  Patient presents with  . Hematuria    (Consider location/radiation/quality/duration/timing/severity/associated sxs/prior treatment) Patient is a 62 y.o. male presenting with hematuria. The history is provided by the patient.  Hematuria  He has noted blood in his urine for the last month. This is not associated with urinary urgency, frequency, and tenesmus. There has been some mild and intermittent suprapubic aching. Denies any flank pain. There's been no nausea or vomiting. He saw his PCP 6 days ago and was started on Cipro but was told to discontinue it when urine culture came back negative. He states that while taking Cipro, the hearing seemed to clear up and then it started getting dark again yesterday. He denies fever, chills, sweats. He denies weight loss.  Past Medical History  Diagnosis Date  . MS (multiple sclerosis)     Past Surgical History  Procedure Laterality Date  . Appendectomy      No family history on file.  History  Substance Use Topics  . Smoking status: Never Smoker   . Smokeless tobacco: Not on file  . Alcohol Use: No      Review of Systems  Genitourinary: Positive for hematuria.  All other systems reviewed and are negative.    Allergies  Review of patient's allergies indicates no known allergies.  Home Medications   Current Outpatient Rx  Name  Route  Sig  Dispense  Refill  . baclofen (LIORESAL) 10 MG tablet   Oral   Take 10 mg by mouth 3 (three) times daily.         . Cholecalciferol (VITAMIN D PO)   Oral   Take 1 tablet by mouth daily.         . ciprofloxacin (CIPRO) 500 MG tablet   Oral   Take 1 tablet (500 mg total) by mouth 2 (two) times daily.   28 tablet   0   . CRANBERRY EXTRACT PO   Oral   Take 1 tablet by mouth daily.         . Cyanocobalamin (VITAMIN B-12 PO)    Oral   Take 1 tablet by mouth daily.         Marland Kitchen dalfampridine (AMPYRA) 10 MG TB12   Oral   Take 10 mg by mouth 2 (two) times daily.         . Interferon Beta-1b (BETASERON) 0.3 MG KIT injection   Subcutaneous   Inject 0.25 mg into the skin every other day.         . Multiple Vitamin (MULTIVITAMIN) tablet   Oral   Take 1 tablet by mouth daily.         Marland Kitchen omeprazole (PRILOSEC) 20 MG capsule   Oral   Take 20 mg by mouth 2 (two) times daily.         . tamsulosin (FLOMAX) 0.4 MG CAPS   Oral   Take 0.8 mg by mouth at bedtime.         Marland Kitchen tiZANidine (ZANAFLEX) 4 MG tablet   Oral   Take 4 mg by mouth 3 (three) times daily.           BP 128/93  Pulse 108  Temp(Src) 97.5 F (36.4 C) (Oral)  Resp 16  Wt 208 lb (94.348 kg)  BMI 28.2 kg/m2  SpO2 100%  Physical  Exam  Nursing note and vitals reviewed.  62 year old male, resting comfortably and in no acute distress. Vital signs are significant for mild tachycardia with heart rate 108. Oxygen saturation is 100%, which is normal. Head is normocephalic and atraumatic. PERRLA, EOMI. Oropharynx is clear. Neck is nontender and supple without adenopathy or JVD. Back is nontender and there is no CVA tenderness. Lungs are clear without rales, wheezes, or rhonchi. Chest is nontender. Heart has regular rate and rhythm without murmur. Abdomen is soft, flat, nontender without masses or hepatosplenomegaly and peristalsis is normoactive. Extremities have no cyanosis or edema, full range of motion is present. Skin is warm and dry without rash. Neurologic: Speech is mildly dysarthric but mental status is otherwise normal, cranial nerves are intact, he has weakness of his legs related to multiple sclerosis.  ED Course  Procedures (including critical care time)  Results for orders placed during the hospital encounter of 03/17/13  URINALYSIS, ROUTINE W REFLEX MICROSCOPIC      Result Value Range   Color, Urine AMBER (*) YELLOW    APPearance CLOUDY (*) CLEAR   Specific Gravity, Urine 1.023  1.005 - 1.030   pH 6.5  5.0 - 8.0   Glucose, UA NEGATIVE  NEGATIVE mg/dL   Hgb urine dipstick LARGE (*) NEGATIVE   Bilirubin Urine NEGATIVE  NEGATIVE   Ketones, ur NEGATIVE  NEGATIVE mg/dL   Protein, ur NEGATIVE  NEGATIVE mg/dL   Urobilinogen, UA 0.2  0.0 - 1.0 mg/dL   Nitrite NEGATIVE  NEGATIVE   Leukocytes, UA TRACE (*) NEGATIVE  URINE MICROSCOPIC-ADD ON      Result Value Range   Squamous Epithelial / LPF RARE  RARE   WBC, UA 3-6  <3 WBC/hpf   RBC / HPF TOO NUMEROUS TO COUNT  <3 RBC/hpf   Bacteria, UA MANY (*) RARE  CBC WITH DIFFERENTIAL      Result Value Range   WBC 6.9  4.0 - 10.5 K/uL   RBC 5.01  4.22 - 5.81 MIL/uL   Hemoglobin 14.5  13.0 - 17.0 g/dL   HCT 40.9  81.1 - 91.4 %   MCV 81.6  78.0 - 100.0 fL   MCH 28.9  26.0 - 34.0 pg   MCHC 35.5  30.0 - 36.0 g/dL   RDW 78.2  95.6 - 21.3 %   Platelets 194  150 - 400 K/uL   Neutrophils Relative 66  43 - 77 %   Neutro Abs 4.6  1.7 - 7.7 K/uL   Lymphocytes Relative 19  12 - 46 %   Lymphs Abs 1.3  0.7 - 4.0 K/uL   Monocytes Relative 13 (*) 3 - 12 %   Monocytes Absolute 0.9  0.1 - 1.0 K/uL   Eosinophils Relative 2  0 - 5 %   Eosinophils Absolute 0.1  0.0 - 0.7 K/uL   Basophils Relative 0  0 - 1 %   Basophils Absolute 0.0  0.0 - 0.1 K/uL  BASIC METABOLIC PANEL      Result Value Range   Sodium 141  135 - 145 mEq/L   Potassium 4.0  3.5 - 5.1 mEq/L   Chloride 104  96 - 112 mEq/L   CO2 27  19 - 32 mEq/L   Glucose, Bld 104 (*) 70 - 99 mg/dL   BUN 14  6 - 23 mg/dL   Creatinine, Ser 0.86  0.50 - 1.35 mg/dL   Calcium 9.8  8.4 - 57.8 mg/dL   GFR calc  non Af Amer >90  >90 mL/min   GFR calc Af Amer >90  >90 mL/min   Ct Abdomen Pelvis W Contrast  03/17/2013  *RADIOLOGY REPORT*  Clinical Data: Hematuria.  CT ABDOMEN AND PELVIS WITH CONTRAST  Technique:  Multidetector CT imaging of the abdomen and pelvis was performed following the standard protocol during bolus  administration of intravenous contrast.  Contrast: OMNIPAQUE IOHEXOL 300 MG/ML  SOLN  Comparison: 09/30/2010.  Findings: Lung bases show no acute findings.  Heart size normal. No pericardial or pleural effusion.  Liver is unremarkable.  There are small hyperattenuating structures along the posterior wall of the gallbladder. Adrenal glands are unremarkable.  Low attenuation lesions in the kidneys measure up to 1.8 cm on the left. A cluster of cysts in the lower pole left kidney simulates a mass (series 2, image 42), unchanged from 04/04/2010.  Small stones are seen in the kidneys bilaterally.  Right ureter is mildly dilated. A 3 mm calcification is seen in the distal right ureter with a 6 mm calcification at the right ureteral vesicle junction.  Minimal right periureteric stranding.  Spleen, pancreas, stomach and small bowel are unremarkable.  A fair amount of stool is seen in the colon.  Small bilateral inguinal hernias contain fat.  No pathologically enlarged lymph nodes. Atherosclerotic calcification of the arterial vasculature without abdominal aortic aneurysm.  Subcutaneous emphysema is seen in the lower left anterior pelvic wall, presumably iatrogenic in nature. No worrisome lytic or sclerotic lesions.  IMPRESSION:  1.  Mild right hydronephrosis secondary to stones in the distal right ureter and at the right ureteral vesicle junction. 2.  Bilateral renal stones. 3.  Small gallbladder polyps versus stones. 4.  Question mild constipation.   Original Report Authenticated By: Leanna Battles, M.D.     Images viewed by me.   1. Ureterolithiasis   2. Hematuria       MDM  Painless hematuria of uncertain cause. Old records are reviewed and he had normal basic metabolic panel 6 days ago. Urinalysis showed hematuria but culture had no growth. Likely source would be bladder tumors. He will be sent for CT scan today and then referred back to his urologist.  CT shows ureterolithiasis which probably  accounts for his hematuria. He is only taking tamsulosin and he will also begin a prescription for naproxen. He has a scheduled followup with his urologist in about 3 weeks and I think that timeframe is appropriate. His hematuria clears with passing stone, he will not need any further workup. If hematuria does not clear, he will probably need cystoscopy.  Dione Booze, MD 03/17/13 1250

## 2013-03-17 NOTE — ED Notes (Signed)
Pt reports hematuria that started yesterday.  He was seen by PMD Thursday for hematuria, given Cipro but told to discontinue by PMD.

## 2013-03-18 LAB — URINE CULTURE: Colony Count: 9000

## 2013-04-05 ENCOUNTER — Other Ambulatory Visit: Payer: Self-pay | Admitting: Urology

## 2013-06-24 ENCOUNTER — Ambulatory Visit: Payer: 59 | Admitting: Internal Medicine

## 2013-07-01 ENCOUNTER — Ambulatory Visit: Payer: 59 | Admitting: Internal Medicine

## 2013-07-29 ENCOUNTER — Ambulatory Visit (INDEPENDENT_AMBULATORY_CARE_PROVIDER_SITE_OTHER): Payer: BC Managed Care – PPO | Admitting: Internal Medicine

## 2013-07-29 VITALS — BP 140/80 | HR 75 | Temp 97.8°F

## 2013-07-29 DIAGNOSIS — R609 Edema, unspecified: Secondary | ICD-10-CM

## 2013-07-29 DIAGNOSIS — R21 Rash and other nonspecific skin eruption: Secondary | ICD-10-CM

## 2013-07-29 DIAGNOSIS — R310 Gross hematuria: Secondary | ICD-10-CM

## 2013-07-29 MED ORDER — HYDROCORTISONE 2.5 % EX CREA: TOPICAL_CREAM | Freq: Two times a day (BID) | CUTANEOUS | Status: DC

## 2013-07-29 MED ORDER — KETOCONAZOLE 2 % EX CREA: TOPICAL_CREAM | Freq: Two times a day (BID) | CUTANEOUS | Status: DC

## 2013-07-29 NOTE — Patient Instructions (Addendum)
Apply the creams (mix 50-50) twice a day x 10 days, once better keep the area dry with powder. Frequent rotation and sheep skin may help you avoid a pressure ulcer!    Pressure Ulcer A pressure ulcer is a sore that has formed from the break down of skin and exposure of deeper layers of tissue. It develops in areas of the body where there is unrelieved pressure. Pressure ulcers are usually found over a boney area, such as the shoulder blades, spine, lower back, hips, knees, ankles, and heels. RISK FACTORS  Decreased ability to move.  Decreased ability to feel pain or discomfort.  Excessive skin moisture from urine, stool, sweat, or secretions.  Poor nutrition.  Dehydration.  Tobacco, drug, or alcohol abuse.  Pulling sheets that are under a patient when changing his or her position.  Obesity.  Increased adult age.  Age of less than 2 years.  Premature newborns.  Hospitalization in a critical care unit for longer than four days with use of medical devices.  Prolonged use of medical devices.  Critical illness.  Anemia.  Traumatic brain injury.  Spinal cord injury.  Stroke.  Diabetes.  Poor blood glucose control.  Low blood pressure (hypotension).  Low oxygen levels.  Medicines that reduce blood flow.  Infection.  Obesity. STAGING PRESSURE ULCERS Your caregiver may determine the degree of severity (stage) of your pressure ulcer. The stages include:  Stage 1: The skin is red, and when the skin is pressed, it stays red.  Stage 2: The top layer of skin is gone, and there is a shallow, pink ulcer.  Stage 3: The ulcer becomes deeper, and it is more difficult to see the whole wound. Also, there may be yellow or brown parts, as well as pink and red parts.  Stage 4: The ulcer may be deep and red, pink, brown, white, or yellow. Bone or muscle may be seen.  Unstageable pressure ulcer: The ulcer is covered almost completely with black, brown, or yellow tissue. It  is not known how deep the ulcer is or what stage it is until this covering comes off.  Suspected deep tissue injury: A patient's skin can be injured from pressure or pulling on the skin when his or her position is changed. The skin appears purple or maroon. There may not be an opening in the skin, but there could be a blood-filled blister. This deep tissue injury is often difficult to see in people with darker skin tones. The site may open and become deeper in time. However, early interventions will help the area heal and may prevent the area from opening. DIAGNOSIS  Your caregiver will diagnose your pressure ulcer based on its appearance. Your caregiver may determine the stage of your pressure ulcer as well. Your caregiver may request tests to check for infection, assess your circulation, or to check for other diseases, such as diabetes. TREATMENT  Treatment of your pressure ulcer begins with determining what stage the ulcer is in. Your treatment team may include your caregiver, a wound care specialist, a nutritionist, a physical therapist, and a Careers adviser. Treatments include:   Moving or repositioning every 1 2 hours.  Using beds or mattresses to shift your body weight and pressure points frequently.  Improving your diet.  Cleaning and bandaging (dressing) the open wound.  Giving antibiotic medicines.  Removing damaged tissue.  Surgery and sometimes skin grafts. HOME CARE INSTRUCTIONS  Follow the care plan that was started in the hospital.  Avoid staying in the  same position for more than 2 hours. Use padding, devices, or mattresses to cushion your pressure points as directed by your caregiver.  Eat well. Take nutritional supplements and vitamins as directed by your caregiver.  Keep all follow-up appointments.  Take pain medicine as directed by your caregiver. SEEK MEDICAL CARE IF:   Your pressure ulcer is not improving.  You do not know how to care for your pressure ulcer.  You  notice other areas of redness on your skin. SEEK IMMEDIATE MEDICAL CARE IF:   You have increasing redness, swelling, or pain in your pressure ulcer.  You notice pus, or increased pus, coming from your pressure ulcer.  You have a fever.  You notice a bad smell coming from the wound or dressing.  Your pressure ulcer opens up again. MAKE SURE YOU:   Understand these instructions.  Will watch your condition.  Will get help right away if you are not doing well or get worse. Document Released: 12/09/2005 Document Revised: 11/25/2012 Document Reviewed: 07/26/2011 [facility] Patient Information 2014 [place], [place].

## 2013-07-29 NOTE — Progress Notes (Signed)
  Subjective:    Patient ID: Stephen Wade, male    DOB: 1951-06-12, 62 y.o.   MRN: [id number]  HPI  Acute visit, pressure ulcer?  Noted at rash in the buttocks for the last 2 weeks. Also, having some extremity edema and is concerned about it.  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 Mild pain. No fever or chills Since he noted the rash, he is already taken steps to turn more frequently.     Objective:   Physical Exam  Constitutional: He appears well-developed and well-nourished. No distress.  Musculoskeletal:  Mild bilateral lower extremity edema, symmetric  Skin: He is not diaphoretic.         Assessment & Plan:  Rash, Doubt rash between the buttocks is a pressure ulcer, from a fungal infection?. Plan:  Treat with ketoconazole and hydrocortisone. He is definitely a high risk for pressure ulcers, prevention discussed.  Edema, likely positional, he spends all the time sitting. Recommend a low-salt diet and frequent leg elevation.

## 2013-07-30 ENCOUNTER — Encounter: Payer: Self-pay | Admitting: Internal Medicine

## 2013-07-30 NOTE — Assessment & Plan Note (Signed)
Shortly after he was seen w/ gross hematuria he passed a stone

## 2013-08-24 ENCOUNTER — Ambulatory Visit (INDEPENDENT_AMBULATORY_CARE_PROVIDER_SITE_OTHER): Payer: BC Managed Care – PPO | Admitting: Internal Medicine

## 2013-08-24 ENCOUNTER — Encounter: Payer: Self-pay | Admitting: Internal Medicine

## 2013-08-24 VITALS — BP 130/82 | HR 71 | Temp 98.0°F | Wt 204.0 lb

## 2013-08-24 DIAGNOSIS — L8991 Pressure ulcer of unspecified site, stage 1: Secondary | ICD-10-CM

## 2013-08-24 DIAGNOSIS — L89109 Pressure ulcer of unspecified part of back, unspecified stage: Secondary | ICD-10-CM

## 2013-08-24 DIAGNOSIS — L89151 Pressure ulcer of sacral region, stage 1: Secondary | ICD-10-CM

## 2013-08-24 NOTE — Progress Notes (Signed)
  Subjective:    Patient ID: Stephen Wade, male    DOB: May 28, 1951, 62 y.o.   MRN: [id number]  HPI Acute visit Patient is concerned about possible pressure ulcer.  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    Objective:   Physical Exam  Skin:      BP 130/82  Pulse 71  Temp(Src) 98 F (36.7 C)  Wt 204 lb (92.534 kg)  BMI 27.66 kg/m2  SpO2 98%        Assessment & Plan:

## 2013-08-24 NOTE — Assessment & Plan Note (Signed)
Today redness at the  buttocks do look like stage I pressure ulcers. We again discussed different strategies to prevent more damage including frequent turning, sheepskin which he has already, frequent monitoring by wife, Desitin. Additionally, he will call me anytime if the area gets worse, we'll get the [facility] skin skin care team involved. Today , I spent more than 15 min with the patient, >50% of the time counseling

## 2013-09-29 ENCOUNTER — Encounter (HOSPITAL_BASED_OUTPATIENT_CLINIC_OR_DEPARTMENT_OTHER): Payer: BC Managed Care – PPO | Attending: General Surgery

## 2013-09-29 DIAGNOSIS — L89309 Pressure ulcer of unspecified buttock, unspecified stage: Secondary | ICD-10-CM | POA: Insufficient documentation

## 2013-09-29 DIAGNOSIS — L8992 Pressure ulcer of unspecified site, stage 2: Secondary | ICD-10-CM | POA: Insufficient documentation

## 2013-09-29 DIAGNOSIS — G35 Multiple sclerosis: Secondary | ICD-10-CM | POA: Insufficient documentation

## 2013-09-30 NOTE — Progress Notes (Signed)
[facility]  NAME:  Stephen Wade, Stephen Wade NO.:  [id number]  MEDICAL RECORD NO.:  [id number]      DATE OF BIRTH:  05/17/1951  PHYSICIAN:  Ardath Sax, M.D.      VISIT DATE:  09/29/2013                                  OFFICE VISIT   Mr. Gilkeson is a 62 year old Caucasian man who unfortunately suffers from multiple sclerosis and has for 30 some years.  He enters today with a stage II 1-cm pressure ulcer on his right buttocks.  It is fairly superficial.  It does have some bio film which I scraped off with a curette and we will start treating it with Santyl ointment and DuoDERM dressing to take some of the pressure off.  He states that he spends most of his time in a wheelchair and that this has been present now for a couple of months.  On examination today, his blood pressure is 136/81, respirations 19, pulse 80, temperature 97.9.  He weighs 200 pounds and he is 6 feet tall.  He is here with his wife who can help change dressings.  They state that he tries to get exercise and stand, but he is unable to walk at all. His appetite is good.  He gets vitamins and he takes interferon beta 12 subcu and he takes this every month.  He is also on baclofen and vitamins.  As I stated, he is afebrile.  He has a regular sinus rhythm. His lungs are clear.  His bowels move normally and his appetite is good. His diagnosis is pressure ulcer, left buttock, stage II, and we are going to treat him with Santyl and a DuoDERM.     Ardath Sax, M.D.     PP/MEDQ  D:  09/29/2013  T:  09/30/2013  Job:  [id number]

## 2013-11-26 ENCOUNTER — Telehealth: Payer: Self-pay | Admitting: Internal Medicine

## 2013-11-26 NOTE — Telephone Encounter (Signed)
Stanton Kidney from [facility] called and wanted to inform dr Drue Novel that Frederica Kuster was open to case management and if dr Drue Novel had any questions he can call her.

## 2013-11-26 NOTE — Telephone Encounter (Signed)
thx

## 2013-12-27 ENCOUNTER — Encounter (HOSPITAL_BASED_OUTPATIENT_CLINIC_OR_DEPARTMENT_OTHER): Payer: BC Managed Care – PPO | Attending: Plastic Surgery

## 2013-12-27 DIAGNOSIS — L98499 Non-pressure chronic ulcer of skin of other sites with unspecified severity: Secondary | ICD-10-CM | POA: Insufficient documentation

## 2013-12-27 DIAGNOSIS — K219 Gastro-esophageal reflux disease without esophagitis: Secondary | ICD-10-CM | POA: Insufficient documentation

## 2013-12-27 DIAGNOSIS — G35 Multiple sclerosis: Secondary | ICD-10-CM | POA: Insufficient documentation

## 2013-12-27 DIAGNOSIS — Z79899 Other long term (current) drug therapy: Secondary | ICD-10-CM | POA: Insufficient documentation

## 2013-12-28 NOTE — Progress Notes (Signed)
[facility]  NAME:  Stephen Wade, Stephen Wade                 ACCOUNT NO.:  [id number]  MEDICAL RECORD NO.:  [id number]      DATE OF BIRTH:  28-Apr-1951  PHYSICIAN:  Wayland Denis, DO       VISIT DATE:  12/27/2013                                  OFFICE VISIT   The patient is a 63 year old gentleman, who is here for evaluation of his sacral ulcers.  He has MS and has had at least with the diagnosis for the past 10 years.  He is using Desitin on the area right now and he thinks it is likely related to shifting and sliding.  He is in a powered wheelchair.  He is married and has pretty good support at home.  PAST MEDICAL HISTORY:  Positive for gastroesophageal reflux, urolithiasis, and MS.  PROCEDURES:  Appendectomy and vasectomy.  MEDICATIONS:  Baclofen, vitamin D, cranberry extract, vitamin B12, hydrocortisone, interferon, ketoconazole, multivitamin, and Prilosec.  He does not have any allergies.  SOCIAL HISTORY:  He is not a smoker.  Lives at home with his wife and has good support.  He does wear glasses and seems to have a good understanding of his condition.  PHYSICAL EXAMINATION:  GENERAL:  He is alert, oriented, and cooperative, not in any acute distress.  He is very pleasant. HEENT:  Pupils are equal.  Extraocular muscles are intact.  He does not have any cervical lymphadenopathy. LUNGS:  His breathing is unlabored. HEART:  His heart rate is regular.  The wound is superficial on the sacral area, more on the buttocks.  This does not appear to be anything more than a stage I, likely related to sliding.  I do not see any sign of infection.  The area is a little bit rough, so he has been sliding on it.  Recommend continuing with Desitin type.  We are going to put PCA on there.  He can use Desitin at home with the silver alginate and then we had very lengthy discussions about offloading protein, multivitamin, vitamin C, and zinc.     Wayland Denis,  DO    CS/MEDQ  D:  12/27/2013  T:  12/28/2013  Job:  [id number]

## 2014-01-04 NOTE — Progress Notes (Signed)
[facility]  NAME:  Stephen Wade, Stephen Wade                 ACCOUNT NO.:  [id number]  MEDICAL RECORD NO.:  [id number]      DATE OF BIRTH:  04/27/1951  PHYSICIAN:  Wayland Denis, DO       VISIT DATE:  01/03/2014                                  OFFICE VISIT   HISTORY:  The patient is a 63 year old male who is here for followup on his sacral ulcer.  He has done extremely well this past week and has been able to keep off the area and improved on the decrease of the friction.  He was using the collagen and has responded very well and has completely healed at this point.  There has been no change in his medications or social history.  He has a very supportive wife.  PHYSICAL EXAMINATION:  On exam, he is alert, oriented, cooperative, not in any acute distress.  He is very pleasant.  He does have the multiple sclerosis and seems to be doing very well with handling it.  The wound has healed.  As mentioned, he still has a little bit of scabbing and roughness in the area, and I have recommended some alternations in the way that he scoots and reposition.  I would continue with the Desitin and lotion and follow up as needed.     Wayland Denis, DO     CS/MEDQ  D:  01/03/2014  T:  01/04/2014  Job:  [id number]

## 2014-05-12 ENCOUNTER — Encounter: Payer: Self-pay | Admitting: Internal Medicine

## 2014-05-12 ENCOUNTER — Ambulatory Visit (INDEPENDENT_AMBULATORY_CARE_PROVIDER_SITE_OTHER): Payer: BC Managed Care – PPO | Admitting: Internal Medicine

## 2014-05-12 VITALS — BP 138/68 | HR 78 | Temp 97.5°F | Wt 210.0 lb

## 2014-05-12 DIAGNOSIS — R3989 Other symptoms and signs involving the genitourinary system: Secondary | ICD-10-CM

## 2014-05-12 DIAGNOSIS — R609 Edema, unspecified: Secondary | ICD-10-CM | POA: Insufficient documentation

## 2014-05-12 DIAGNOSIS — R399 Unspecified symptoms and signs involving the genitourinary system: Secondary | ICD-10-CM

## 2014-05-12 LAB — BASIC METABOLIC PANEL
BUN: 18 mg/dL (ref 6–23)
CHLORIDE: 105 meq/L (ref 96–112)
CO2: 28 meq/L (ref 19–32)
CREATININE: 0.8 mg/dL (ref 0.4–1.5)
Calcium: 9.5 mg/dL (ref 8.4–10.5)
GFR: 102.84 mL/min (ref >60.00)
Glucose, Bld: 84 mg/dL (ref 70–99)
Potassium: 4.4 mEq/L (ref 3.5–5.1)
Sodium: 140 mEq/L (ref 135–145)

## 2014-05-12 LAB — TSH: TSH: 1.2 u[IU]/mL (ref 0.35–4.50)

## 2014-05-12 LAB — HEMOGLOBIN A1C: Hgb A1c MFr Bld: 5.2 % (ref 4.6–6.5)

## 2014-05-12 NOTE — Assessment & Plan Note (Signed)
Urinary frequency, previously  Urology  recommended that self catheterization but declines to do that, symptoms are stable, no clinical evidence of a UTI. If he ever develops UTI symptoms such as fever, chills, nausea, change in the color of the urine he will let us know and we'll check a UA and urine culture.

## 2014-05-12 NOTE — Patient Instructions (Signed)
Get your blood work before you leave   Will call you about the ultrasound  Leg elevation  Low salt diet  Call if the  edema worsens or not improving          Sodium-Controlled Diet Sodium is a mineral. It is found in many foods. Sodium may be found naturally or added during the making of a food. The most common form of sodium is salt, which is made up of sodium and chloride. Reducing your sodium intake involves changing your eating habits. The following guidelines will help you reduce the sodium in your diet:  Stop using the salt shaker.  Use salt sparingly in cooking and baking.  Substitute with sodium-free seasonings and spices.  Do not use a salt substitute (potassium chloride) without your caregiver's permission.  Include a variety of fresh, unprocessed foods in your diet.  Limit the use of processed and convenience foods that are high in sodium. USE THE FOLLOWING FOODS SPARINGLY: Breads/Starches  Commercial bread stuffing, commercial pancake or waffle mixes, coating mixes. Waffles. Croutons. Prepared (boxed or frozen) potato, rice, or noodle mixes that contain salt or sodium. Salted Jamaica[place] fries or hash browns. Salted popcorn, breads, crackers, chips, or snack foods. Vegetables  Vegetables canned with salt or prepared in cream, butter, or cheese sauces. Sauerkraut. Tomato or vegetable juices canned with salt.  Fresh vegetables are allowed if rinsed thoroughly. Fruit  Fruit is okay to eat. Meat and Meat Substitutes  Salted or smoked meats, such as bacon or Canadian bacon, chipped or corned beef, hot dogs, salt pork, luncheon meats, pastrami, ham, or sausage. Canned or smoked fish, poultry, or meat. Processed cheese or cheese spreads, blue or Roquefort cheese. Battered or frozen fish products. Prepared spaghetti sauce. Baked beans. Reuben sandwiches. Salted nuts. Caviar. Milk  Limit buttermilk to 1 cup per week. Soups and Combination Foods  Bouillon cubes, canned  or dried soups, broth, consomm. Convenience (frozen or packaged) dinners with more than 600 mg sodium. Pot pies, pizza, Asian food, fast food cheeseburgers, and specialty sandwiches. Desserts and Sweets  Regular (salted) desserts, pie, commercial fruit snack pies, commercial snack cakes, canned puddings.  Eat desserts and sweets in moderation. Fats and Oils  Gravy mixes or canned gravy. No more than 1 to 2 tbs of salad dressing. Chip dips.  Eat fats and oils in moderation. Beverages  See those listed under the vegetables and milk groups. Condiments  Ketchup, mustard, meat sauces, salsa, regular (salted) and lite soy sauce or mustard. Dill pickles, olives, meat tenderizer. Prepared horseradish or pickle relish. Dutch-processed cocoa. Baking powder or baking soda used medicinally. Worcestershire sauce. "Light" salt. Salt substitute, unless approved by your caregiver. Document Released: 05/31/2002 Document Revised: 03/02/2012 Document Reviewed: 01/01/2010 [facility] Patient Information 2014 [employer], [place].

## 2014-05-12 NOTE — Progress Notes (Signed)
Pre-visit discussion using our clinic review tool. No additional management support is needed unless otherwise documented below in the visit note.  

## 2014-05-12 NOTE — Assessment & Plan Note (Addendum)
R>L edema likely postural.has already some evidence of stasis  Dermatitis, is very important that he keeps this under control. Rec: Low salt diet Compression stokings consistently Leg elevation Declined diuretics Also R swelling is worse-- check a u/s Labs

## 2014-05-12 NOTE — Progress Notes (Signed)
Subjective:    Patient ID: Stephen Wade, male    DOB: 01/17/1951, 63 y.o.   MRN: [id number]  DOS:  05/12/2014 Type of  visit: Here with his wife, needs to discussed the following issues  One-year history lower extremity edema, worse on the right. He is trying to elevate his legs 15 minutes 4 times a day during the daytime, he sleeps in a recliner that allows leg elevation Uses compression stockings mostly at night.  Long history of urinary frequency, symptoms are stable, see assessment and plan. Wonders if he needs a urine sample  Occasional tingling in the right leg, would like to be checked for diabetes.   ROS Denies fever chills No difficulty breathing or chest pain. No dysuria or change in the color of the urine.      Past Medical History  Diagnosis Date  . MS (multiple sclerosis) 2005  . Lower urinary tract symptoms (LUTS)     Dr Patsi Searsannenbaum  . Urolithiasis 2011  . GERD (gastroesophageal reflux disease)     had on- off chest pain, symptoms resolved with Prilosec 11/2010    Past Surgical History  Procedure Laterality Date  . Appendectomy       ruptured 03-2010  . Vasectomy      History   Social History  . Marital Status: Married    Spouse Name: N/A    Number of Children: 3  . Years of Education: N/A   Occupational History  . Occupation: independent Health visitorfurniture business owner    Social History Main Topics  . Smoking status: Never Smoker   . Smokeless tobacco: Not on file  . Alcohol Use: No     Comment: rare  . Drug Use: No  . Sexual Activity: Not on file   Other Topics Concern  . Not on file   Social History Narrative  . No narrative on file        Medication List       This list is accurate as of: 05/12/14 11:59 PM.  Always use your most recent med list.               baclofen 10 MG tablet  Commonly known as:  LIORESAL  Take 20 mg by mouth 3 (three) times daily.     FLOMAX 0.4 MG Caps capsule  Generic drug:  tamsulosin  Take 0.4 mg  by mouth.     omeprazole 20 MG capsule  Commonly known as:  PRILOSEC  Take 20 mg by mouth 2 (two) times daily.     TECFIDERA PO  Take 2 tablets by mouth at bedtime.           Objective:   Physical Exam BP 138/68  Pulse 78  Temp(Src) 97.5 F (36.4 C) (Oral)  Wt 210 lb (95.255 kg)  SpO2 96% General -- alert, well-developed, NAD.  N Lungs -- normal respiratory effort, no intercostal retractions, no accessory muscle use, and normal breath sounds.  Heart-- normal rate, regular rhythm, no murmur.  Extremities-- n + pitting edema R>L, R calf is bigger by ~ 1 cm Good cap refill, + pedal pulses, skin slt hyperpigmented @ toes but no red-tender  Neurologic--  alert & oriented X3.  Psych-- Cognition and judgment appear intact. Cooperative with normal attention span and concentration. No anxious or depressed appearing.        Assessment & Plan:    Requests diabetes screening-- check a a1c  Today , I spent more than 25 min  with the patient, >50% of the time counseling, answering multiple concerns

## 2014-05-13 ENCOUNTER — Encounter: Payer: Self-pay | Admitting: Internal Medicine

## 2014-05-17 ENCOUNTER — Encounter: Payer: Self-pay | Admitting: *Deleted

## 2014-05-17 ENCOUNTER — Ambulatory Visit (HOSPITAL_BASED_OUTPATIENT_CLINIC_OR_DEPARTMENT_OTHER): Payer: BC Managed Care – PPO

## 2014-05-17 NOTE — Progress Notes (Signed)
Letter sent with lab results.  

## 2014-08-16 ENCOUNTER — Encounter: Payer: Self-pay | Admitting: Internal Medicine

## 2014-08-16 ENCOUNTER — Ambulatory Visit (INDEPENDENT_AMBULATORY_CARE_PROVIDER_SITE_OTHER): Payer: BC Managed Care – PPO | Admitting: Internal Medicine

## 2014-08-16 VITALS — BP 140/78 | HR 87 | Temp 97.6°F

## 2014-08-16 DIAGNOSIS — R21 Rash and other nonspecific skin eruption: Secondary | ICD-10-CM

## 2014-08-16 MED ORDER — KETOCONAZOLE 2 % EX CREA: 1.0000 "application " | TOPICAL_CREAM | Freq: Two times a day (BID) | CUTANEOUS | Status: DC

## 2014-08-16 NOTE — Progress Notes (Signed)
Pre-visit discussion using our clinic review tool. No additional management support is needed unless otherwise documented below in the visit note.  

## 2014-08-16 NOTE — Patient Instructions (Signed)
Next ketoconazole 2% cream with hydrocortisone 1% OTC cream (50-50), apply twice a day to the affected area for the next 2 weeks Keep area clean and dry with powder Call if not improving soon

## 2014-08-16 NOTE — Progress Notes (Signed)
   Subjective:    Patient ID: Stephen Wade, male    DOB: 02/05/1951, 63 y.o.   MRN: [id number]  DOS:  08/16/2014 Type of visit - description: acute History: 2 weeks history of a rash in the groins, started on the left and now is bilateral. Mild itching.   ROS Denies fever or chills No other rashes He had a stage I pressure (buttocks), that looks better per pt . Recently had a MS exacerbation and got steroids, lower  extremity edema got worse but now is coming back to baseline. Did have a recent fall.  Past Medical History  Diagnosis Date  . MS (multiple sclerosis) 2005  . Lower urinary tract symptoms (LUTS)     Dr Patsi Sears  . Urolithiasis 2011  . GERD (gastroesophageal reflux disease)     had on- off chest pain, symptoms resolved with Prilosec 11/2010    Past Surgical History  Procedure Laterality Date  . Appendectomy       ruptured 03-2010  . Vasectomy      History   Social History  . Marital Status: Married    Spouse Name: N/A    Number of Children: 3  . Years of Education: N/A   Occupational History  . Occupation: independent Health visitor    Social History Main Topics  . Smoking status: Never Smoker   . Smokeless tobacco: Not on file  . Alcohol Use: No     Comment: rare  . Drug Use: No  . Sexual Activity: Not on file   Other Topics Concern  . Not on file   Social History Narrative  . No narrative on file        Medication List       This list is accurate as of: 08/16/14 11:59 PM.  Always use your most recent med list.               baclofen 10 MG tablet  Commonly known as:  LIORESAL  Take 20 mg by mouth 3 (three) times daily.     FLOMAX 0.4 MG Caps capsule  Generic drug:  tamsulosin  Take 0.4 mg by mouth.     ketoconazole 2 % cream  Commonly known as:  NIZORAL  Apply 1 application topically 2 (two) times daily.     omeprazole 20 MG capsule  Commonly known as:  PRILOSEC  Take 20 mg by mouth 2 (two) times daily.     TECFIDERA PO  Take 2 tablets by mouth at bedtime.           Objective:   Physical Exam  Skin:      BP 140/78  Pulse 87  Temp(Src) 97.6 F (36.4 C) (Oral)  SpO2 96% General -- alert, well-developed, NAD. Sitting in a wheelchair    Extremities-- R> L edema seems at baseline  Neurologic--  alert & oriented X3.   Psych-- Cognition and judgment appear intact. Cooperative with normal attention span and concentration. No anxious or depressed appearing.        Assessment & Plan:    rash, likely fungal, see instructions   Recent fall, has a small excoriation on the right knee, range of motion of the knee seems at baseline. He also injured his right elbow. Declined any x-rays or further care

## 2015-01-06 ENCOUNTER — Telehealth: Payer: Self-pay | Admitting: Internal Medicine

## 2015-01-06 NOTE — Telephone Encounter (Signed)
Pt will need a F/U appt to discuss per [name].

## 2015-01-06 NOTE — Telephone Encounter (Signed)
Please advise if Pt should come into office to discuss.

## 2015-01-06 NOTE — Telephone Encounter (Signed)
lvm awaiting call back  °

## 2015-01-06 NOTE — Telephone Encounter (Signed)
Caller name: Lui,Leslie Relationship to patient: self  Can be reached: [phone]  Reason for call: Requesting a RX for pride gogo scooter.

## 2015-01-06 NOTE — Telephone Encounter (Signed)
OV please

## 2015-01-12 ENCOUNTER — Telehealth: Payer: Self-pay | Admitting: *Deleted

## 2015-01-12 ENCOUNTER — Telehealth: Payer: Self-pay | Admitting: Internal Medicine

## 2015-01-12 NOTE — Telephone Encounter (Signed)
Caller name:Mall, Verlon Au Relation to WU:[id number] Call back number:[phone] Pharmacy:  Reason for call: pt would like to speak with you regarding getting a rx for a scooter.

## 2015-01-12 NOTE — Telephone Encounter (Signed)
Pt called in regarding Rx for scooter on 01/06/2015, was supposed to be informed that he needs an appt to see [name] to discuss. Pt also called Dr. Maple Hudson: Neurology which I believe they informed he needed an appt, and is calling again regarding scooter. Pt WILL HAVE TO MAKE APPT TO DISCUSS SCOOTER RX WITH [name].

## 2015-01-12 NOTE — Telephone Encounter (Signed)
Spoke with Mrs. Hyndman. She is trying to gather documention for taxes.  I advised [facility] can provide this at ov/fim

## 2015-01-12 NOTE — Telephone Encounter (Signed)
Pts wife called about scooter prescription.

## 2015-01-16 NOTE — Telephone Encounter (Signed)
Spoke with patient and stated that he will need an office visit for the paperwork and the scooter Rx.  Patient stated to "forget it." Listened to patient concerns, and she stated understanding that she will need appointment but stated she is "not very happy about it."  eal

## 2015-01-16 NOTE — Telephone Encounter (Signed)
Patient wife called in regarding this. She states that she doesn't feel like patient needs to come in for this. She states that [name] knows that patient needs this scooter. Best # [phone]

## 2015-01-16 NOTE — Telephone Encounter (Signed)
Spoke with [name], Pt needs appt for paperwork completion. They will need to bring paperwork for scooter with them since we do not have.

## 2015-01-16 NOTE — Telephone Encounter (Signed)
Called patient and left voicemail stating that patient WILL NEED to make appointment with [name] for scooter Rx.  eal

## 2015-01-16 NOTE — Telephone Encounter (Signed)
Please advise 

## 2015-02-10 ENCOUNTER — Ambulatory Visit (INDEPENDENT_AMBULATORY_CARE_PROVIDER_SITE_OTHER): Payer: BLUE CROSS/BLUE SHIELD | Admitting: Neurology

## 2015-02-10 ENCOUNTER — Encounter: Payer: Self-pay | Admitting: Neurology

## 2015-02-10 VITALS — BP 132/60 | HR 64 | Resp 16 | Ht 72.0 in | Wt 220.0 lb

## 2015-02-10 DIAGNOSIS — G35 Multiple sclerosis: Secondary | ICD-10-CM

## 2015-02-10 DIAGNOSIS — R399 Unspecified symptoms and signs involving the genitourinary system: Secondary | ICD-10-CM

## 2015-02-10 DIAGNOSIS — R531 Weakness: Secondary | ICD-10-CM

## 2015-02-10 DIAGNOSIS — R261 Paralytic gait: Secondary | ICD-10-CM | POA: Insufficient documentation

## 2015-02-10 DIAGNOSIS — R5383 Other fatigue: Secondary | ICD-10-CM

## 2015-02-10 DIAGNOSIS — G801 Spastic diplegic cerebral palsy: Secondary | ICD-10-CM

## 2015-02-10 DIAGNOSIS — R4189 Other symptoms and signs involving cognitive functions and awareness: Secondary | ICD-10-CM

## 2015-02-10 NOTE — Progress Notes (Signed)
[facility]  PATIENT: Stephen Wade DOB: 1950/12/28  REFERRING DOCTOR OR PCP:  Willow Ora SOURCE: Patient and wife  _________________________________   HISTORICAL  CHIEF COMPLAINT:  Chief Complaint  Patient presents with  . Multiple Sclerosis    He is not walking at all anymore--uses his electric scooter.  He does exercise Nu-step--which is a sitting bicycle type machine.  He also works arms with free-wts. He c/o increased spasticity in right arm.  He is taking Baclofen 10mg  qam and 20mg  qhs./fim    HISTORY OF PRESENT ILLNESS:  Stephen Wade is a 64 yo man who was diagnosed with MS in 2005 after he woke up with right leg weakness and clumsiness.  He had MRI's of the brain and spine consistent with MS.   He was originally seeing Dr. Kathrynn Ducking at [facility].   A repeat MRI showed progression allowing the diagnosis of MS to be made.    He started on Betaseron.  Since his first exacerbation, he has had progressive gait and cognitive dysfunction but these changes have mostly been gradual.     Due to worsening, he was placed on Tysabri but felt much worse while on it.   He was also JCV Ab positive so stopped after 4-6 doses.   He went back to Betaseron and a couple years later transferred care to me.   We changed to Tecfidera.   He tolerates tecfidera well with mild flushing but no GI symptoms.    He feels mostly stable on Tecfidera but has had some progression of the poor gait.  He now spends much of his time in a scooter.    He had one flare-up last year, though, where he felt gait changed  A few days of IV Solu-Medrol helped to get back to a baseline.     Gait/Strength:   He reports right worse than left weakness in legs and milder right arm weakness.     He notes spasticity mostly in the right leg, especially at night.  Using an exercise machine daily has helped the spasticity.   He has not used a walker x several months.   He was falling easily and wife could not get hi up (fire dept  came seeral times).   Therefore, he now relies on the scooter.   He transfers well using his hands.    He has bars to help in the bathroom.   He has a built in seat in the shower.     Numbness:  He denies any significant numbness.  Bladder:   He has urinary retention only partially helped by tamsulosin.  He had UTI in the past bit none in last 2 years.   A catheter is recommended by urology but he declines.  Fatigue:   He notes only mild fatigue and doe not think he needs any medication for it.   He sleeps well most nights but still takes a couple short naps during the day.  Mood:   He feels his mood is good.  Wife notes he has depression and is very moody.   He is doing better since exercising more.  Cognition:   He denies any cognitive problems.  Wife notes some verbal fluency issues and mild reduced memory and processing speed.       REVIEW OF SYSTEMS: Constitutional: No fevers, chills, sweats, or change in appetite Eyes: No visual changes, double vision, eye pain Ear, nose and throat: No hearing loss, ear pain, nasal congestion, sore  throat Cardiovascular: No chest pain, palpitations.   Mild edema in legs. Respiratory: No shortness of breath at rest or with exertion.   No wheezes GastrointestinaI: No nausea, vomiting, diarrhea, abdominal pain, fecal incontinence Genitourinary: No dysuria, urinary retention or frequency.  No nocturia. Musculoskeletal: No neck pain, back pain Integumentary: No rash, pruritus, skin lesions Neurological: as above Psychiatric: as above Endocrine: No palpitations, diaphoresis, change in appetite, change in weigh or increased thirst Hematologic/Lymphatic: No anemia, purpura, petechiae. Allergic/Immunologic: No itchy/runny eyes, nasal congestion, recent allergic reactions, rashes  ALLERGIES: No Known Allergies  HOME MEDICATIONS:  Current outpatient prescriptions:  .  baclofen (LIORESAL) 10 MG tablet, Take 20 mg by mouth 3 (three) times daily. ,  Disp: , Rfl:  .  Dimethyl Fumarate (TECFIDERA PO), Take 2 tablets by mouth at bedtime., Disp: , Rfl:  .  ketoconazole (NIZORAL) 2 % cream, Apply 1 application topically 2 (two) times daily., Disp: 60 g, Rfl: 0 .  omeprazole (PRILOSEC) 20 MG capsule, Take 20 mg by mouth 2 (two) times daily., Disp: , Rfl:  .  tamsulosin (FLOMAX) 0.4 MG CAPS capsule, Take 0.4 mg by mouth., Disp: , Rfl:   PAST MEDICAL HISTORY: Past Medical History  Diagnosis Date  . MS (multiple sclerosis) 2005  . Lower urinary tract symptoms (LUTS)     Dr Patsi Sears  . Urolithiasis 2011  . GERD (gastroesophageal reflux disease)     had on- off chest pain, symptoms resolved with Prilosec 11/2010    PAST SURGICAL HISTORY: Past Surgical History  Procedure Laterality Date  . Appendectomy       ruptured 03-2010  . Vasectomy      FAMILY HISTORY: Family History  Problem Relation Age of Onset  . Heart attack Mother   . Brain cancer Father     SOCIAL HISTORY:  History   Social History  . Marital Status: Married    Spouse Name: N/A  . Number of Children: 3  . Years of Education: N/A   Occupational History  . Occupation: independent Health visitor    Social History Main Topics  . Smoking status: Never Smoker   . Smokeless tobacco: Not on file  . Alcohol Use: No     Comment: rare  . Drug Use: No  . Sexual Activity: Not on file   Other Topics Concern  . Not on file   Social History Narrative     PHYSICAL EXAM  Filed Vitals:   02/10/15 1130  BP: 132/60  Pulse: 64  Resp: 16  Height: 6' (1.829 m)  Weight: 220 lb (99.791 kg)    Body mass index is 29.83 kg/(m^2).   General: The patient is well-developed and well-nourished and in no acute in scooter  Eyes:  Funduscopic exam shows normal optic discs and retinal vessels.  Neck: The neck is supple, no carotid bruits are noted.  The neck is nontender.  Cardiovascular: The heart has a regular rate and rhythm with a normal S1 and S2. There  were no murmurs, gallops or rubs. Lungs are clear to auscultation.  Skin: Extremities are without significant edema.  Musculoskeletal:  Back is nontender  Neurologic Exam  Mental status: The patient is alert and oriented x 3 at the time of the examination. The patient has apparent normal recent and remote memory, with a reduced  attention span and concentration ability.   Speech is normal.  Cranial nerves: Extraocular movements are full. Pupils are equal, round, and reactive to light and accomodation.  Visual  fields are full.  Facial symmetry is present. There is good facial sensation to soft touch bilaterally.Facial strength is normal.  Trapezius and sternocleidomastoid strength is normal. No dysarthria is noted.  The tongue is midline, and the patient has symmetric elevation of the soft palate. No obvious hearing deficits are noted.  Motor:  Muscle bulk is normal.   Tone is increased, legs > arms, right > left. Strength is  5 / 5 in left arm, 4+/5 in rightt arm, 2/5 in left leg hip extensors and 4-/5 distally, 2-/5 in right leg.   Sensory: Sensory testing is intact to pinprick, soft touch and vibration sensation in all 4 extremities.  Coordination: Cerebellar testing reveals good finger-nose-finger on the left, reduced on the right and he is unable heel-to-shin bilaterally.  Gait and station: Station is poor needs bilat support.   Can not take more than one step.Marland Kitchen   Reflexes: Deep tendon reflexes are symmetric and normal bilaterally.   Plantar responses are flexor.    DIAGNOSTIC DATA (LABS, IMAGING, TESTING) - I reviewed patient records, labs, notes, testing and imaging myself where available.  Lab Results  Component Value Date   WBC 6.9 03/17/2013   HGB 14.5 03/17/2013   HCT 40.9 03/17/2013   MCV 81.6 03/17/2013   PLT 194 03/17/2013      Component Value Date/Time   NA 140 05/12/2014 1224   K 4.4 05/12/2014 1224   CL 105 05/12/2014 1224   CO2 28 05/12/2014 1224   GLUCOSE 84  05/12/2014 1224   BUN 18 05/12/2014 1224   CREATININE 0.8 05/12/2014 1224   CALCIUM 9.5 05/12/2014 1224   AST 23 01/18/2011 1330   ALT 25 01/18/2011 1330   GFRNONAA >90 03/17/2013 1135   GFRAA >90 03/17/2013 1135   Lab Results  Component Value Date   CHOL 207* 01/18/2011   HDL 31.10* 01/18/2011   LDLDIRECT 158.6 01/18/2011   TRIG 146.0 01/18/2011   CHOLHDL 7 01/18/2011   Lab Results  Component Value Date   HGBA1C 5.2 05/12/2014   No results found for: VITAMINB12 Lab Results  Component Value Date   TSH 1.20 05/12/2014       ASSESSMENT AND PLAN  Multiple sclerosis - Plan: CBC with Differential, PR POWER OPERATED VEHICLE  Lower urinary tract symptoms (LUTS)  Spastic gait  Spastic diplegia  Other fatigue  Disturbed cognition    In summary, Sharief Wainwright is a 64 year old man with relapsing form of secondary progressive multiple sclerosis. He tolerates Tecfidera and feels he has done okay and that medicine without any definite exacerbations, though his gait has progressively worsened. He had discussed other medications including Lemtrada and I again discussed this with him. He would prefer not to consider another medicine at this point. He also would prefer not to have an MRI of the brain and spinal cord as he is very claustrophobic. I discussed with him the importance of monitoring the MS and he will reconsider and get back to [place] he wants [place] to schedule one. If an MRI shows new lesions, we would need to consider more aggressive therapy.   His main problem is gait dysfunction and he now uses the scooter constantly. I have advised him to continue the exercises that he is doing and to try to increase exercise further. He has some mood issues and cognitive decline but his wife feels that these are fairly stable.  He will return to see me in 6 months or sooner if he has new or  worsening neurologic symptoms.  Quinnley Colasurdo A. Epimenio Foot, MD, PhD 02/10/2015, 11:42 AM Certified in  Neurology, Clinical Neurophysiology, Sleep Medicine, Pain Medicine and Neuroimaging  [facility] [address] [place], [place] [address] [phone]

## 2015-02-11 LAB — CBC WITH DIFFERENTIAL/PLATELET
BASOS ABS: 0.1 10*3/uL (ref 0.0–0.2)
BASOS: 1 %
EOS ABS: 0.2 10*3/uL (ref 0.0–0.4)
EOS: 2 %
HCT: 44.6 % (ref 37.5–51.0)
Hemoglobin: 15.6 g/dL (ref 12.6–17.7)
IMMATURE GRANS (ABS): 0 10*3/uL (ref 0.0–0.1)
IMMATURE GRANULOCYTES: 0 %
Lymphocytes Absolute: 1 10*3/uL (ref 0.7–3.1)
Lymphs: 15 %
MCH: 30.1 pg (ref 26.6–33.0)
MCHC: 35 g/dL (ref 31.5–35.7)
MCV: 86 fL (ref 79–97)
MONOCYTES: 10 %
MONOS ABS: 0.7 10*3/uL (ref 0.1–0.9)
NEUTROS ABS: 5.1 10*3/uL (ref 1.4–7.0)
NEUTROS PCT: 72 %
Platelets: 211 10*3/uL (ref 150–379)
RBC: 5.18 x10E6/uL (ref 4.14–5.80)
RDW: 14 % (ref 12.3–15.4)
WBC: 7.1 10*3/uL (ref 3.4–10.8)

## 2015-02-13 ENCOUNTER — Telehealth: Payer: Self-pay | Admitting: Neurology

## 2015-02-13 NOTE — Telephone Encounter (Signed)
Spoke with Fayrene Fearing and per RAS advised that labs are normal.  He verbalized understanding of same/fim

## 2015-02-13 NOTE — Telephone Encounter (Signed)
Patient called and stated he would like to proceed with MRI Brain and Spinal Cord.  Please call and advise.

## 2015-05-31 ENCOUNTER — Telehealth: Payer: Self-pay | Admitting: Neurology

## 2015-05-31 MED ORDER — BACLOFEN 10 MG PO TABS: ORAL_TABLET | ORAL | Status: DC

## 2015-05-31 NOTE — Telephone Encounter (Signed)
Last OV note says: He is taking Baclofen  qam and  qhs./fim.  Rx has been sent.  I called back to advise.  He is aware.

## 2015-05-31 NOTE — Telephone Encounter (Signed)
Patient called and requested to get a refill on Rx. baclofen (LIORESAL) 10 MG tablet. Please call and advise.   [id number] - [place], [name] - [address]

## 2015-06-06 ENCOUNTER — Telehealth: Payer: Self-pay | Admitting: Neurology

## 2015-06-06 MED ORDER — DIMETHYL FUMARATE 240 MG PO CPDR: 240.0000 mg | DELAYED_RELEASE_CAPSULE | Freq: Two times a day (BID) | ORAL | Status: DC

## 2015-06-06 NOTE — Telephone Encounter (Signed)
Patient called and requested a refill on Rx. Dimethyl Fumarate (TECFIDERA PO), he is requesting enough medication to last him until his next appt. Informed that patient that this Rx was not written by Dr. Epimenio Foot but he insist that it was. Please call and advise.

## 2015-06-06 NOTE — Telephone Encounter (Signed)
Colon Branch was just seen in Feb.  He as a sched. f/u with RAS.  90 day supply of Tecfidera with 3 additional r/f escribed to [facility] per request/fim

## 2015-07-13 ENCOUNTER — Telehealth: Payer: Self-pay | Admitting: Neurology

## 2015-07-13 ENCOUNTER — Encounter: Payer: Self-pay | Admitting: Neurology

## 2015-07-13 ENCOUNTER — Ambulatory Visit (INDEPENDENT_AMBULATORY_CARE_PROVIDER_SITE_OTHER): Payer: BLUE CROSS/BLUE SHIELD | Admitting: Neurology

## 2015-07-13 VITALS — BP 128/70 | HR 76 | Resp 16 | Ht 72.0 in | Wt 220.0 lb

## 2015-07-13 DIAGNOSIS — R5383 Other fatigue: Secondary | ICD-10-CM

## 2015-07-13 DIAGNOSIS — G35 Multiple sclerosis: Secondary | ICD-10-CM

## 2015-07-13 DIAGNOSIS — G801 Spastic diplegic cerebral palsy: Secondary | ICD-10-CM | POA: Diagnosis not present

## 2015-07-13 DIAGNOSIS — R4189 Other symptoms and signs involving cognitive functions and awareness: Secondary | ICD-10-CM

## 2015-07-13 DIAGNOSIS — R261 Paralytic gait: Secondary | ICD-10-CM | POA: Diagnosis not present

## 2015-07-13 NOTE — Progress Notes (Signed)
[facility]  PATIENT: Stephen Wade DOB: February 25, 1951  REFERRING DOCTOR OR PCP:  Willow Ora SOURCE: Patient and wife  _________________________________   HISTORICAL  CHIEF COMPLAINT:  Chief Complaint  Patient presents with  . Multiple Sclerosis    Sts. he tolerates Tecfidera well.  Sts. everything is about the same. He continues to exercise on exercise bike at home./fim    HISTORY OF PRESENT ILLNESS:  Stephen Wade is a 64 yo man who was diagnosed with MS in 2005.  He denoes any new symptoms since I saw him 5 -6 months ago.      Gait/Strength:   He reports right worse than left weakness and spasticity and mild right arm weakness.   He feels using an exercise machine daily has helped the spasticity.   He now relies on the scooter but he transfers well using his hands.    He has bars to help in the bathroom.   He has a built in seat in the shower.   He is having more trouble with his handwriting.   He does ok still with keyboarding (though slow).   Spasticity was helped by baclofen.     Numbness:  He denies any significant numbness.  Bladder:   He has urinary retention only partially helped by tamsulosin but helped more by Rapiflo.   No UTI this year.   Fatigue:   He is exercising daily.    Nu-step reclining elliptical like device several times a day.   He notes only mild fatigue.   He sleeps well most nights but still takes a couple short naps during the day.  Mood:   He feels his mood is fine.  His wife notes he is moody but he is doing better since exercising more.  Cognition:   He denies any cognitive problems.  Wife notes some verbal fluency issues and mild reduced memory and processing speed.       MS History:  In 2005, he woke up with right leg weakness and clumsiness.  He had MRI's of the brain and spine consistent with MS.   He was originally seeing Dr. Kathrynn Ducking at [facility].   A repeat MRI showed progression allowing the diagnosis of MS to be made.    He started  on Betaseron.  Since his first exacerbation, he has had progressive gait and cognitive dysfunction but these changes have mostly been gradual.     Due to worsening, he was placed on Tysabri but felt much worse while on it.   He was also JCV Ab positive so stopped after 4-6 doses.   He went back to Betaseron and a couple years later transferred care to me.   We changed to Tecfidera.   He tolerates tecfidera well with mild flushing but no GI symptoms.    He feels mostly stable on Tecfidera but has had some progression of the poor gait.  He now spends much of his time in a scooter.    He had one flare-up last year, though, where he felt gait changed  A few days of IV Solu-Medrol helped to get back to a baseline.       REVIEW OF SYSTEMS: Constitutional: No fevers, chills, sweats, or change in appetite Eyes: No visual changes, double vision, eye pain Ear, nose and throat: No hearing loss, ear pain, nasal congestion, sore throat Cardiovascular: No chest pain, palpitations.   Mild edema in legs. Respiratory: No shortness of breath at rest or with exertion.  No wheezes GastrointestinaI: No nausea, vomiting, diarrhea, abdominal pain, fecal incontinence Genitourinary: No dysuria, urinary retention or frequency.  No nocturia. Musculoskeletal: No neck pain, back pain Integumentary: No rash, pruritus, skin lesions Neurological: as above Psychiatric: as above Endocrine: No palpitations, diaphoresis, change in appetite, change in weigh or increased thirst Hematologic/Lymphatic: No anemia, purpura, petechiae. Allergic/Immunologic: No itchy/runny eyes, nasal congestion, recent allergic reactions, rashes  ALLERGIES: No Known Allergies  HOME MEDICATIONS:  Current outpatient prescriptions:  .  omeprazole (PRILOSEC) 20 MG capsule, Take 20 mg by mouth 2 (two) times daily., Disp: , Rfl:  .  baclofen (LIORESAL) 20 MG tablet, TAKE ONE TABLET 5 TIMES DAILY., Disp: , Rfl: 11 .  Dimethyl Fumarate 240 MG CPDR,  Take 1 capsule (240 mg total) by mouth 2 (two) times daily., Disp: 180 capsule, Rfl: 3 .  ketoconazole (NIZORAL) 2 % cream, Apply 1 application topically 2 (two) times daily. (Patient not taking: Reported on 07/13/2015), Disp: 60 g, Rfl: 0 .  RAPAFLO 8 MG CAPS capsule, TAKE ONE CAPSULE BY MOUTH ONCE DAILY WITH FOOD, Disp: , Rfl: 11  PAST MEDICAL HISTORY: Past Medical History  Diagnosis Date  . MS (multiple sclerosis) 2005  . Lower urinary tract symptoms (LUTS)     Dr Patsi Sears  . Urolithiasis 2011  . GERD (gastroesophageal reflux disease)     had on- off chest pain, symptoms resolved with Prilosec 11/2010    PAST SURGICAL HISTORY: Past Surgical History  Procedure Laterality Date  . Appendectomy       ruptured 03-2010  . Vasectomy      FAMILY HISTORY: Family History  Problem Relation Age of Onset  . Heart attack Mother   . Brain cancer Father     SOCIAL HISTORY:  History   Social History  . Marital Status: Married    Spouse Name: N/A  . Number of Children: 3  . Years of Education: N/A   Occupational History  . Occupation: independent Health visitor    Social History Main Topics  . Smoking status: Never Smoker   . Smokeless tobacco: Not on file  . Alcohol Use: No     Comment: rare  . Drug Use: No  . Sexual Activity: Not on file   Other Topics Concern  . Not on file   Social History Narrative     PHYSICAL EXAM  Filed Vitals:   07/13/15 1141  BP: 128/70  Pulse: 76  Resp: 16  Height: 6' (1.829 m)  Weight: 220 lb (99.791 kg)    Body mass index is 29.83 kg/(m^2).   General: The patient is well-developed and well-nourished and in no acute in scooter  Skin: Extremities are without significant edema.  Neurologic Exam  Mental status: The patient is alert and oriented x 3 at the time of the examination. The patient has apparent normal recent and remote memory, with a reduced  attention span and concentration ability.   Speech is mildly  dysarthric.  Cranial nerves: Extraocular movements are full. .  Facial symmetry is present. There is good facial sensation to soft touch bilaterally.Facial strength is normal.  Trapezius and sternocleidomastoid strength is normal. No dysarthria is noted.  The tongue is midline, and the patient has symmetric elevation of the soft palate. No obvious hearing deficits are noted.  Motor:  Muscle bulk is normal.   Tone is increased, legs > arms, right > left (normal in left arm). Strength is  5 / 5 in left arm, 4+/5 in right arm,  2/5 in left leg hip extensors and 4-/5 distally, 2-/5 in right leg.   Sensory: Sensory testing is intact to pinprick, soft touch and vibration sensation in all 4 extremities.  Coordination: Cerebellar testing reveals good finger-nose-finger on the left, reduced on the right and he is unable heel-to-shin bilaterally.  Gait and station: Station needs bilat support.  Can not walk  Reflexes: Deep tendon reflexes are increased on the right bilaterally.   Plantar responses are extensor right and flexor left    DIAGNOSTIC DATA (LABS, IMAGING, TESTING) - I reviewed patient records, labs, notes, testing and imaging myself where available.  Lab Results  Component Value Date   WBC 7.1 02/10/2015   HGB 15.6 02/10/2015   HCT 44.6 02/10/2015   MCV 86 02/10/2015   PLT 211 02/10/2015      Component Value Date/Time   NA 140 05/12/2014 1224   K 4.4 05/12/2014 1224   CL 105 05/12/2014 1224   CO2 28 05/12/2014 1224   GLUCOSE 84 05/12/2014 1224   BUN 18 05/12/2014 1224   CREATININE 0.8 05/12/2014 1224   CALCIUM 9.5 05/12/2014 1224   AST 23 01/18/2011 1330   ALT 25 01/18/2011 1330   GFRNONAA >90 03/17/2013 1135   GFRAA >90 03/17/2013 1135   Lab Results  Component Value Date   CHOL 207* 01/18/2011   HDL 31.10* 01/18/2011   LDLDIRECT 158.6 01/18/2011   TRIG 146.0 01/18/2011   CHOLHDL 7 01/18/2011   Lab Results  Component Value Date   HGBA1C 5.2 05/12/2014   No  results found for: VITAMINB12 Lab Results  Component Value Date   TSH 1.20 05/12/2014       ASSESSMENT AND PLAN  Multiple sclerosis  Spastic gait  Other fatigue  Spastic diplegia  Disturbed cognition   1.   continue  Tecfidera, check labs to assess for lymphopenia  2.    Occupational therapy for right arm function  3.   Exercise as tolerated. 4.   He will return to see me in 6 months or sooner if he has new or worsening neurologic symptoms.  Shyah Cadmus A. Epimenio Foot, MD, PhD 07/13/2015, 11:53 AM Certified in Neurology, Clinical Neurophysiology, Sleep Medicine, Pain Medicine and Neuroimaging  [facility] [address], [address] [place], [place] [address] [phone]

## 2015-07-14 NOTE — Telephone Encounter (Signed)
Note created in error.

## 2015-07-20 ENCOUNTER — Telehealth: Payer: Self-pay | Admitting: Neurology

## 2015-07-20 NOTE — Telephone Encounter (Signed)
I have spoken with Verlon Au this afternoon.  When Colon Branch was seen here on 07-13-15, Dr. Epimenio Foot ordered a cbc with diff to check Lymphocytes (he is on Tecfidera).  She sts. they were told by staff at check out that he could have this drawn at any [facility].  They went to [facility] in [facility] this am but were told they didn't have an order for labs.  I advised that labs can be drawn at any [facility], but he must take the order to them, or I can fax it in.  She verbalized understanding of same--sts as they have already left [facility], he will likely not have another chance to have labs drawn, before 8--16-16.  His Feb. labs were ok--lymphocytes were good, and has no signs of infection at this time, so I have advised that is fine/fim

## 2015-07-20 NOTE — Telephone Encounter (Signed)
Wife called regarding labwork for husband please call wife back [phone]

## 2015-07-28 ENCOUNTER — Encounter (HOSPITAL_BASED_OUTPATIENT_CLINIC_OR_DEPARTMENT_OTHER): Payer: Self-pay

## 2015-08-08 ENCOUNTER — Other Ambulatory Visit: Payer: Self-pay | Admitting: Neurology

## 2015-08-08 ENCOUNTER — Ambulatory Visit: Payer: BLUE CROSS/BLUE SHIELD | Attending: Neurology | Admitting: Occupational Therapy

## 2015-08-08 ENCOUNTER — Other Ambulatory Visit (INDEPENDENT_AMBULATORY_CARE_PROVIDER_SITE_OTHER): Payer: Self-pay

## 2015-08-08 DIAGNOSIS — R29898 Other symptoms and signs involving the musculoskeletal system: Secondary | ICD-10-CM | POA: Diagnosis present

## 2015-08-08 DIAGNOSIS — Z7409 Other reduced mobility: Secondary | ICD-10-CM | POA: Diagnosis present

## 2015-08-08 DIAGNOSIS — R6889 Other general symptoms and signs: Secondary | ICD-10-CM

## 2015-08-08 DIAGNOSIS — R258 Other abnormal involuntary movements: Secondary | ICD-10-CM | POA: Insufficient documentation

## 2015-08-08 DIAGNOSIS — G35 Multiple sclerosis: Secondary | ICD-10-CM

## 2015-08-08 DIAGNOSIS — R531 Weakness: Secondary | ICD-10-CM | POA: Diagnosis not present

## 2015-08-08 DIAGNOSIS — R252 Cramp and spasm: Secondary | ICD-10-CM

## 2015-08-08 DIAGNOSIS — R279 Unspecified lack of coordination: Secondary | ICD-10-CM | POA: Diagnosis present

## 2015-08-08 DIAGNOSIS — Z0289 Encounter for other administrative examinations: Secondary | ICD-10-CM

## 2015-08-08 NOTE — Therapy (Signed)
[facility] [address] [place], [place], [address] Phone: [phone]   Fax:  [phone]  Occupational Therapy Evaluation  Patient Details  Name: Stephen Wade MRN: [id number] Date of Birth: 1951-11-19 Referring Provider:  Asa Lente, MD  Encounter Date: 08/08/2015      OT End of Session - 08/08/15 1400    Visit Number 1   Number of Visits 17   Date for OT Re-Evaluation 10/07/15   Authorization Type BC[employer], 30 visit limit OT   OT Start Time 1105   OT Stop Time 1150   OT Time Calculation (min) 45 min   Activity Tolerance Patient tolerated treatment well   Behavior During Therapy [facility] for tasks assessed/performed      Past Medical History  Diagnosis Date  . MS (multiple sclerosis) 2005  . Lower urinary tract symptoms (LUTS)     Dr Patsi Sears  . Urolithiasis 2011  . GERD (gastroesophageal reflux disease)     had on- off chest pain, symptoms resolved with Prilosec 11/2010    Past Surgical History  Procedure Laterality Date  . Appendectomy       ruptured 03-2010  . Vasectomy      There were no vitals filed for this visit.  Visit Diagnosis:  Generalized weakness  Right arm weakness  Lack of coordination  Spasticity  Decreased functional mobility and endurance  Decreased activity tolerance      Subjective Assessment - 08/08/15 1111    Subjective  "I am here for my writing"   Pertinent History MS diagnosed in 2005   Limitations non-ambulatory, fall risk, heat sensitivity   Patient Stated Goals improve writing   Currently in Pain? No/denies           [facility] OT Assessment - 08/08/15 0001    Assessment   Diagnosis MS   Onset Date --  2005, difficulty writing for last year   Assessment Pt reports MS affects his spinal cord more than brain   Prior Therapy PT in [place] approx 1-1.5 yrs ago.  No occupational therapy.   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, typing (incr time, difficulty with hitting wrong keys with RUE)   Leisure enjoys using nu-step and typical does on it 2x/day and feels that the exercise has really helped him   ADL   Eating/Feeding --  now eats with LUE for last year   Grooming --  using BUEs for brushing teeth, shaving/combing hair with LUE   Upper Body Bathing Modified independent   Lower Body Bathing Modified independent   Upper Body Dressing --  mod I   Lower Body Dressing Modified independent   Toilet Tranfer Modified independent   Toileting - Clothing Manipulation Modified independent   Toileting -  Hygiene Modified Independent  urinary frequency   Tub/Shower Transfer Modified independent  shower stall with seat, grab bars   ADL comments Pt reports BADLs mod I, but using L nondominant UE  more.   IADL   Prior Level of Function Light Housekeeping wife performs   Light Housekeeping Does not participate in any housekeeping tasks   Prior Level of Function Meal Prep pt cooked some 2-3 yrs ago   Meal Prep Able to complete simple cold meal and snack prep   Prior Level of Function Chartered loss adjuster own vehicle  uses hand controls   Medication Management Is responsible for taking medication in correct dosages at correct time   Mobility   Mobility Status --  uses scooter at all times   Mobility Status Comments transfers using UEs, has not walked in last 6-65months due to frequent falls, stands for 30sec for pressure relief  with UE support   Written Expression   Dominant Hand Right   Handwriting Increased time  approx 60% legibility   Vision - History   Baseline Vision No visual deficits  per pt report   Visual History --  hx of diplopia, but resolved   Activity Tolerance   Activity Tolerance --  fatigues quickly, heat sensitivity   Cognition   Overall Cognitive Status Within Functional Limits for tasks assessed  pt reports minimal change from baseline, not affecting ADLs   Coordination   Gross Motor Movements are Fluid and Coordinated No   Fine Motor Movements are Fluid and Coordinated No   Coordination and Movement Description ataxia   9 Hole Peg Test Right;Left   Right 9 Hole Peg Test 109 sec with multiple drops   Left 9 Hole Peg Test 47.78sec   Box and Blocks R-27blocks, L-41blocks   Tremors mild action tremor/ataxia R hand affecting writing   Tone   Assessment Location Right Upper Extremity;Left Upper Extremity   ROM / Strength   AROM / PROM / Strength AROM;Strength   AROM   Overall AROM Comments BUEs WFL except decr ability to oppose to 5th digit BUEs (incr difficulty with R than L) and difficulty with true tip opposition to other digits with RUE, demo decr RUE (approx 90%) elbow extension with functional reach   Strength   Overall Strength Comments Proximal strength:  LUE grossly 4 to 4+/5, RUE grossly 4- to4/5   Hand Function   Right Hand Grip (lbs) 44   Left Hand Grip (lbs) 52   RUE Tone   RUE Tone Mild   LUE Tone   LUE Tone Within Functional Limits                         OT Education - 08/08/15 1632    Education provided Yes   Education Details how decr strength, decr coordination, ataxia/tremors, and spasticity can affect RUE functional use/writing, benefit of appropriate HEP   Person(s) Educated Patient   Methods Explanation   Comprehension Verbalized understanding          OT Short Term Goals - 08/08/15 1437    OT SHORT TERM GOAL #1   Title Pt will  be independent with initial HEP for coordination and stretching.--check STGs 09/07/15   Time 4   Period Weeks   Status New   OT SHORT TERM GOAL #2   Title Pt will verbalize understanding of AE/strategies to assist with ADLs/writing prn.   Time 4   Period Weeks   Status New   OT SHORT TERM GOAL #3   Title Pt will be able to write 1-2 simple sentences with at least 75% legibility.   Baseline approx 60%  Time 4   Period Weeks   Status New   OT SHORT TERM GOAL #4   Title Pt will improve coordination for ADLs as shown by improving time on 9-hole peg test by at least 10sec with RUE.   Baseline 109sec   Time 4   Period Weeks   Status New   OT SHORT TERM GOAL #5   Title Pt will improve coordination for ADLs as shown by improving score on box and blocs test by at least 6 with RUE   Baseline 27 blocks   Time 4   Period Weeks   Status New           OT Long Term Goals - 08/08/15 1622    OT LONG TERM GOAL #1   Title Pt will be independent with appropriate HEP for strength and RUE functional use.   Time 8   Period Weeks   Status New   OT LONG TERM GOAL #2   Title Pt will demo full elbow extension with RUE reaching at least 75% of the time.   Time 8   Period Weeks   Status New   OT LONG TERM GOAL #3   Title Pt will be able to write 1-2 simple sentences with at least 90% legibility using AE/strategies prn.   Baseline approx 60%   Time 8   Period Weeks   Status New   OT LONG TERM GOAL #4   Title Pt will improve coordination for ADLs as shown by improving time on 9-hole peg test by at least 20sec.   Baseline 109sec    Time 8   Period Weeks   Status New   OT LONG TERM GOAL #5   Title Pt will report ability to use dominant RUE for eating/grooming tasks at least 25% of the time.   Baseline using mostly LUE   Time 8   Period Weeks   Status New               Plan - 08/08/15 1405    Clinical Impression Statement Pt diagnosed with MS presents today with decr strength,  ataxia, decreased coordination, spasticity, decr functional mobility affecting ADLs/IADLs and particularly dominant RUE functional use.  Pt is self-employed and desires to improve writing for work tasks.  Pt would benefit from occupational therapy to address RUE functional use, coordination, spasticity, weakness in order to maximize ADL performance and prevent future complications.    Pt will benefit from skilled therapeutic intervention in order to improve on the following deficits (Retired) Decreased activity tolerance;Decreased coordination;Decreased endurance;Decreased range of motion;Impaired tone;Decreased knowledge of use of DME;Impaired UE functional use;Decreased strength;Decreased mobility   Rehab Potential Good   OT Frequency 2x / week   OT Duration 8 weeks  +eval   OT Treatment/Interventions Self-care/ADL training;Fluidtherapy;Moist Heat;DME and/or AE instruction;Patient/family education;Splinting;Therapeutic exercises;Therapeutic activities;Therapeutic exercise;Ultrasound;Passive range of motion;Neuromuscular education;Electrical Stimulation;Energy conservation;Manual Therapy;Cryotherapy   Plan initiate HEP, writing strategies   Consulted and Agree with Plan of Care Patient        Problem List Patient Active Problem List   Diagnosis Date Noted  . Spastic gait 02/10/2015  . Spastic diplegia 02/10/2015  . Other fatigue 02/10/2015  . Disturbed cognition 02/10/2015  . Edema 05/12/2014  . Pressure ulcer of coccygeal region, stage 1 08/24/2013  . Hypogonadism male 03/11/2013  . Lower urinary tract symptoms (LUTS) 12/26/2011  . GERD 11/30/2010  . URINARY CALCULUS 10/05/2010  . CHEST PAIN 10/05/2010  . MULTIPLE SCLEROSIS 07/24/2007  [facility] 08/08/2015, 4:36 PM  [facility] [address] [place], [place], [address] Phone: [phone]   Fax:  [phone]    Willa Frater, OTR/L 08/08/2015 4:36  PM

## 2015-08-09 LAB — CBC WITH DIFFERENTIAL/PLATELET
BASOS ABS: 0.1 10*3/uL (ref 0.0–0.2)
BASOS: 1 %
EOS (ABSOLUTE): 0.1 10*3/uL (ref 0.0–0.4)
Eos: 1 %
HEMOGLOBIN: 16 g/dL (ref 12.6–17.7)
Hematocrit: 46.6 % (ref 37.5–51.0)
IMMATURE GRANS (ABS): 0 10*3/uL (ref 0.0–0.1)
Immature Granulocytes: 0 %
LYMPHS: 16 %
Lymphocytes Absolute: 1.4 10*3/uL (ref 0.7–3.1)
MCH: 29.8 pg (ref 26.6–33.0)
MCHC: 34.3 g/dL (ref 31.5–35.7)
MCV: 87 fL (ref 79–97)
MONOCYTES: 9 %
Monocytes Absolute: 0.8 10*3/uL (ref 0.1–0.9)
NEUTROS ABS: 6.4 10*3/uL (ref 1.4–7.0)
Neutrophils: 73 %
Platelets: 216 10*3/uL (ref 150–379)
RBC: 5.37 x10E6/uL (ref 4.14–5.80)
RDW: 13.5 % (ref 12.3–15.4)
WBC: 8.8 10*3/uL (ref 3.4–10.8)

## 2015-10-23 ENCOUNTER — Encounter: Payer: Self-pay | Admitting: Occupational Therapy

## 2015-10-23 NOTE — Therapy (Signed)
[facility] [address] [place], [place], [address] Phone: [phone]   Fax:  [phone]  Patient Details  Name: Stephen Wade MRN: [id number] Date of Birth: 10/16/1951 Referring Provider:  No ref. provider found  Encounter Date: 10/23/2015  OCCUPATIONAL THERAPY DISCHARGE SUMMARY  Visits from Start of Care: 1  Current functional level related to goals / functional outcomes: Goals not met as pt did not return after eval   Remaining deficits: See eval as pt did not return after eval   Education / Equipment: Not completed due to pt not returning to OT  Plan: Patient agrees to discharge.  Patient goals were not met. Patient is being discharged due to not returning since the last visit. Pt did not return after eval. ?????       FREEMAN,ANGELA 10/23/2015, 1:10 PM  [facility] [address] [place], [place], [address] Phone: [phone]   Fax:  [phone]   Angela Freeman, OTR/L 10/23/2015 1:10 PM    

## 2015-11-20 ENCOUNTER — Telehealth: Payer: Self-pay | Admitting: Neurology

## 2015-11-20 DIAGNOSIS — G801 Spastic diplegic cerebral palsy: Secondary | ICD-10-CM

## 2015-11-20 DIAGNOSIS — R261 Paralytic gait: Secondary | ICD-10-CM

## 2015-11-20 DIAGNOSIS — G35 Multiple sclerosis: Secondary | ICD-10-CM

## 2015-11-20 NOTE — Telephone Encounter (Signed)
Patient called to request something from Dr. Epimenio Foot in order for patient to take a driving test with Occupational Therapist Evaluator. Fax to [phone] Phone# [phone].

## 2015-11-20 NOTE — Telephone Encounter (Signed)
[facility].  He faxed me a letter stating the [place] DMV is requiring he have an Chief Strategy Officer perform a behind the wheel test to determine his ability to drive, and they sent him a list of OT Evaluators for him to contact.  There is no request in this letter for anything from our office, so I am not sure what he needs from us/fim

## 2015-11-20 NOTE — Telephone Encounter (Signed)
I have spoken with Stephen Wade again.  He sts. he has spoken with [facility] OT and they requested referral from [facility] for driving eval.  Order entered into EPIC.  Pt. aware/fim

## 2015-11-20 NOTE — Telephone Encounter (Signed)
I have spoken with  Stephen Wade this afternoon.  He sts. the state of [place] is requiring he take a road test to renew drivers license.  He will fax me the letter; I will call him back once I receive it/fim

## 2015-11-23 NOTE — Telephone Encounter (Signed)
Pt called inquiring about referral. Please call pt when referral has been faxed - fax # [phone]

## 2015-11-27 NOTE — Telephone Encounter (Signed)
Called [place] back did not get and did not get an answer I did receive a ok conformation faxed to [phone]

## 2015-11-27 NOTE — Telephone Encounter (Signed)
Pt called to see if his referral has been faxed. Please call and advise [phone]

## 2015-11-27 NOTE — Telephone Encounter (Signed)
Please call pt reg referral

## 2015-11-27 NOTE — Telephone Encounter (Signed)
[place] with [facility] called sts pt has called to schedule appt but she has not rec'd referral. she can be reached at [phone]

## 2015-11-29 ENCOUNTER — Encounter: Payer: Self-pay | Admitting: Neurology

## 2015-12-11 ENCOUNTER — Other Ambulatory Visit: Payer: Self-pay | Admitting: Neurology

## 2016-01-16 ENCOUNTER — Ambulatory Visit (INDEPENDENT_AMBULATORY_CARE_PROVIDER_SITE_OTHER): Payer: BLUE CROSS/BLUE SHIELD | Admitting: Neurology

## 2016-01-16 ENCOUNTER — Encounter: Payer: Self-pay | Admitting: Neurology

## 2016-01-16 VITALS — BP 156/92 | HR 64 | Resp 18 | Ht 72.0 in | Wt 215.0 lb

## 2016-01-16 DIAGNOSIS — R5383 Other fatigue: Secondary | ICD-10-CM

## 2016-01-16 DIAGNOSIS — R4189 Other symptoms and signs involving cognitive functions and awareness: Secondary | ICD-10-CM | POA: Diagnosis not present

## 2016-01-16 DIAGNOSIS — R261 Paralytic gait: Secondary | ICD-10-CM

## 2016-01-16 DIAGNOSIS — G801 Spastic diplegic cerebral palsy: Secondary | ICD-10-CM | POA: Diagnosis not present

## 2016-01-16 DIAGNOSIS — E559 Vitamin D deficiency, unspecified: Secondary | ICD-10-CM | POA: Diagnosis not present

## 2016-01-16 DIAGNOSIS — G35 Multiple sclerosis: Secondary | ICD-10-CM | POA: Diagnosis not present

## 2016-01-16 NOTE — Progress Notes (Signed)
[facility]  PATIENT: Stephen Wade DOB: [birth year]  REFERRING DOCTOR OR PCP:  Willow Ora SOURCE: Patient and wife  _________________________________   HISTORICAL  CHIEF COMPLAINT:  Chief Complaint  Patient presents with  . Multiple Sclerosis    Sts. he continues to tolerate Tecfidera well.  Thinks he's about the same.  Sts. he exercises regularly at home so is keeping his strength up/fim    HISTORY OF PRESENT ILLNESS:  Stephen Wade is a 65 yo man who was diagnosed with MS in 2005.  He denoes any new symptoms since I saw him 5 -6 months ago.   He is on Tecfidera and tolerates it well.    He has not had anyy more flushing/hot flash recently.     Gait/Strength:   He reports right worse than left weakness and spasticity and mild right arm weakness.   He feels using an exercise machine daily has helped the spasticity.   He uses the scooter but he transfers well using his hands.    He has bars to help in the bathroom.   He has a built in seat in the shower.   The right arm has worsened the past couple years and his handwriting and typing are worse.      He does ok still with keyboarding (though slow).   Spasticity was helped by baclofen 20 mg in the am and 40 at night.    He no longer gets severe spasms.      He tries to exercise with a Nu-Step.    He uses a scooter.   [facility] made a power wheelchair but it doe snot fit in bathroom.  Numbness:  He denies any significant numbness.   He denies dysesthetic pain but his legs feel cold at night, even under a blanket or with socks.     Bladder:   He has urinary retention helped some by Rapiflo.   No UTI this year.   Fatigue:   He is exercising daily.    Nu-step reclining elliptical like device several times a day.   He notes only mild fatigue.   He sleeps well most nights.   He takes occasional naps during the day.  Mood:   He feels his mood is fine.  His wife notes he is moody but he is doing better since  exercising more.  Cognition:   He denies any cognitive problems.  Wife notes some verbal fluency issues and mild reduced memory and processing speed.       MS History:  In 2005, he woke up with right leg weakness and clumsiness.  He had MRI's of the brain and spine consistent with MS.   He was originally seeing Dr. Kathrynn Ducking at [facility].   A repeat MRI showed progression allowing the diagnosis of MS to be made.    He started on Betaseron.  Since his first exacerbation, he has had progressive gait and cognitive dysfunction but these changes have mostly been gradual.     Due to worsening, he was placed on Tysabri but felt much worse while on it.   He was also JCV Ab positive so stopped after 4-6 doses.   He went back to Betaseron and a couple years later transferred care to me.   We changed to Tecfidera.   He tolerates tecfidera well with mild flushing but no GI symptoms.    He feels mostly stable on Tecfidera but has had some progression of the poor gait.  He now spends much of his time in a scooter.    He had one flare-up last year, though, where he felt gait changed  A few days of IV Solu-Medrol helped to get back to a baseline.       REVIEW OF SYSTEMS: Constitutional: No fevers, chills, sweats, or change in appetite Eyes: No visual changes, double vision, eye pain Ear, nose and throat: No hearing loss, ear pain, nasal congestion, sore throat Cardiovascular: No chest pain, palpitations.   Mild edema in legs. Respiratory: No shortness of breath at rest or with exertion.   No wheezes GastrointestinaI: No nausea, vomiting, diarrhea, abdominal pain, fecal incontinence Genitourinary: No dysuria, urinary retention or frequency.  No nocturia. Musculoskeletal: No neck pain, back pain Integumentary: No rash, pruritus, skin lesions Neurological: as above Psychiatric: as above Endocrine: No palpitations, diaphoresis, change in appetite, change in weigh or increased thirst Hematologic/Lymphatic: No anemia,  purpura, petechiae. Allergic/Immunologic: No itchy/runny eyes, nasal congestion, recent allergic reactions, rashes  ALLERGIES: No Known Allergies  HOME MEDICATIONS:  Current outpatient prescriptions:  .  baclofen (LIORESAL) 10 MG tablet, TAKE 1 TABLET BY MOUTH EVERY MORNING AND 2 TABLETS AT BEDTIME, Disp: 90 tablet, Rfl: 6 .  Dimethyl Fumarate 240 MG CPDR, Take 1 capsule (240 mg total) by mouth 2 (two) times daily., Disp: 180 capsule, Rfl: 3 .  omeprazole (PRILOSEC) 20 MG capsule, Take 20 mg by mouth 2 (two) times daily., Disp: , Rfl:  .  RAPAFLO 8 MG CAPS capsule, TAKE ONE CAPSULE BY MOUTH ONCE DAILY WITH FOOD, Disp: , Rfl: 11 .  baclofen (LIORESAL) 20 MG tablet, Reported on 01/16/2016, Disp: , Rfl: 11 .  ketoconazole (NIZORAL) 2 % cream, Apply 1 application topically 2 (two) times daily. (Patient not taking: Reported on 01/16/2016), Disp: 60 g, Rfl: 0  PAST MEDICAL HISTORY: Past Medical History  Diagnosis Date  . MS (multiple sclerosis) (HCC) 2005  . Lower urinary tract symptoms (LUTS)     Dr Patsi Sears  . Urolithiasis 2011  . GERD (gastroesophageal reflux disease)     had on- off chest pain, symptoms resolved with Prilosec 11/2010    PAST SURGICAL HISTORY: Past Surgical History  Procedure Laterality Date  . Appendectomy       ruptured 03-2010  . Vasectomy      FAMILY HISTORY: Family History  Problem Relation Age of Onset  . Heart attack Mother   . Brain cancer Father     SOCIAL HISTORY:  Social History   Social History  . Marital Status: Married    Spouse Name: N/A  . Number of Children: 3  . Years of Education: N/A   Occupational History  . Occupation: independent Health visitor    Social History Main Topics  . Smoking status: Never Smoker   . Smokeless tobacco: Not on file  . Alcohol Use: No     Comment: rare  . Drug Use: No  . Sexual Activity: Not on file   Other Topics Concern  . Not on file   Social History Narrative     PHYSICAL  EXAM  Filed Vitals:   01/16/16 1050  BP: 156/92  Pulse: 64  Resp: 18  Height: 6' (1.829 m)  Weight: 215 lb (97.523 kg)    Body mass index is 29.15 kg/(m^2).   General: The patient is well-developed and well-nourished and in no acute in scooter  Skin: Extremities are without significant edema.  Neurologic Exam  Mental status: The patient is alert and oriented  x 3 at the time of the examination. The patient has apparent normal recent and remote memory, with a reduced  attention span and concentration ability.   Speech is mildly dysarthric.  Cranial nerves: Extraocular movements are full. .  Facial symmetry is present. There is good facial sensation to soft touch bilaterally.Facial strength is normal.  Trapezius and sternocleidomastoid strength is normal. No dysarthria is noted.  The tongue is midline, and the patient has symmetric elevation of the soft palate. No obvious hearing deficits are noted.  Motor:  Muscle bulk is normal.   Tone is increased, legs > arms, right > left (normal in left arm). Strength is  5 / 5 in left arm, 4+/5 in right arm prox and 4+-5/5 grip, 2/5 in left leg hip extensors and 4-/5 distally, 2-/5 in right leg.   Sensory: Sensory testing is intact to pinprick, soft touch and vibration sensation in all 4 extremities.  Coordination: Cerebellar testing reveals good finger-nose-finger on the left, reduced on the right and he is unable heel-to-shin bilaterally.  Gait and station: Station needs bilat support.  Can not walk  Reflexes: Deep tendon reflexes are increased on the right bilaterally.       DIAGNOSTIC DATA (LABS, IMAGING, TESTING) - I reviewed patient records, labs, notes, testing and imaging myself where available.  Lab Results  Component Value Date   WBC 8.8 08/08/2015   HGB 15.6 02/10/2015   HCT 46.6 08/08/2015   MCV 87 08/08/2015   PLT 216 08/08/2015      Component Value Date/Time   NA 140 05/12/2014 1224   K 4.4 05/12/2014 1224   CL 105  05/12/2014 1224   CO2 28 05/12/2014 1224   GLUCOSE 84 05/12/2014 1224   BUN 18 05/12/2014 1224   CREATININE 0.8 05/12/2014 1224   CALCIUM 9.5 05/12/2014 1224   AST 23 01/18/2011 1330   ALT 25 01/18/2011 1330   GFRNONAA >90 03/17/2013 1135   GFRAA >90 03/17/2013 1135   Lab Results  Component Value Date   CHOL 207* 01/18/2011   HDL 31.10* 01/18/2011   LDLDIRECT 158.6 01/18/2011   TRIG 146.0 01/18/2011   CHOLHDL 7 01/18/2011   Lab Results  Component Value Date   HGBA1C 5.2 05/12/2014   No results found for: VITAMINB12 Lab Results  Component Value Date   TSH 1.20 05/12/2014       ASSESSMENT AND PLAN  MULTIPLE SCLEROSIS  Spastic diplegia (HCC)  Disturbed cognition  Other fatigue  Spastic gait   1.   continue  Tecfidera, check labs to assess for lymphopenia check vitamin D level 2.   Continue to use Nu-Step 30-40 minutes daily if possible 3.   We discussed that Biotin 100 mg po tid is in trials for progressive MS. I do not know if it will be found to be beneficial but we can get this through a compounding pharmacist for reasonable price if he wants to do so.    Advised to take vitamin D 4000 or 5000 units daily. 4.   He will return to see me in 6 months or sooner if he has new or worsening neurologic symptoms.  Stephen Wade A. Epimenio Foot, MD, PhD 01/16/2016, 10:57 AM Certified in Neurology, Clinical Neurophysiology, Sleep Medicine, Pain Medicine and Neuroimaging  [facility] [address], [address] [place], [place] [address] [phone]

## 2016-01-17 ENCOUNTER — Telehealth: Payer: Self-pay

## 2016-01-17 DIAGNOSIS — E559 Vitamin D deficiency, unspecified: Secondary | ICD-10-CM

## 2016-01-17 LAB — CBC WITH DIFFERENTIAL/PLATELET
BASOS ABS: 0.1 10*3/uL (ref 0.0–0.2)
Basos: 1 %
EOS (ABSOLUTE): 0.2 10*3/uL (ref 0.0–0.4)
Eos: 4 %
Hematocrit: 44.1 % (ref 37.5–51.0)
Hemoglobin: 14.9 g/dL (ref 12.6–17.7)
IMMATURE GRANS (ABS): 0 10*3/uL (ref 0.0–0.1)
Immature Granulocytes: 0 %
LYMPHS: 16 %
Lymphocytes Absolute: 1 10*3/uL (ref 0.7–3.1)
MCH: 29.3 pg (ref 26.6–33.0)
MCHC: 33.8 g/dL (ref 31.5–35.7)
MCV: 87 fL (ref 79–97)
Monocytes Absolute: 0.5 10*3/uL (ref 0.1–0.9)
Monocytes: 9 %
NEUTROS ABS: 4.2 10*3/uL (ref 1.4–7.0)
Neutrophils: 70 %
PLATELETS: 223 10*3/uL (ref 150–379)
RBC: 5.08 x10E6/uL (ref 4.14–5.80)
RDW: 13.2 % (ref 12.3–15.4)
WBC: 6 10*3/uL (ref 3.4–10.8)

## 2016-01-17 LAB — VITAMIN D 25 HYDROXY (VIT D DEFICIENCY, FRACTURES): VIT D 25 HYDROXY: 20.7 ng/mL — AB (ref 30.0–100.0)

## 2016-01-17 MED ORDER — VITAMIN D (ERGOCALCIFEROL) 1.25 MG (50000 UNIT) PO CAPS: 50000.0000 [IU] | ORAL_CAPSULE | ORAL | Status: DC

## 2016-01-17 NOTE — Telephone Encounter (Signed)
Patient is aware of results and recommendations. Confirmed which pharmacy he uses. Rx sent in.  

## 2016-01-17 NOTE — Telephone Encounter (Signed)
-----   Message from Asa Lente, MD sent at 01/17/2016  8:45 AM EST ----- Please let him know that the vitamin D is low. Please send in vitamin D script 50,000 U weekly 12 weeks. Then 4000-5000 units daily OTC.

## 2016-02-06 ENCOUNTER — Telehealth: Payer: Self-pay | Admitting: Neurology

## 2016-02-06 NOTE — Telephone Encounter (Signed)
Referral sheet received from [place].  I have completed it.  RAS has signed it, and I have faxed it back to her, with fax confirmation received.  I have spoken with [name] and let her know this has been done/fim

## 2016-02-06 NOTE — Telephone Encounter (Signed)
Pt's wife called sts pt called [facility] OT to check on payment plan and he was told he is not in the system, the order has not been rec'd. She is requesting the letter dated 11/28/16 to be faxed to [facility]. The fax # [phone].

## 2016-02-06 NOTE — Telephone Encounter (Signed)
[place] with [facility] called and is requesting a call back. (she would not leave additional info) Thanks!

## 2016-02-06 NOTE — Telephone Encounter (Signed)
PC with [place]any--she sts. she is unable to accept our referral as it is pirnted off of EP[employer].  She will fax me one of their referral sheets and I will complete it and fax it back to her/fim

## 2016-03-21 ENCOUNTER — Telehealth: Payer: Self-pay | Admitting: Neurology

## 2016-03-21 NOTE — Telephone Encounter (Signed)
Pt said he wanted to discuss the new medication for MS. Please call

## 2016-03-21 NOTE — Telephone Encounter (Signed)
I have spoken with Stephen Wade this afternoon and given appt. on 03-27-16 to discuss Ocrelizumab/fim

## 2016-03-27 ENCOUNTER — Encounter: Payer: Self-pay | Admitting: Neurology

## 2016-03-27 ENCOUNTER — Ambulatory Visit (INDEPENDENT_AMBULATORY_CARE_PROVIDER_SITE_OTHER): Payer: BLUE CROSS/BLUE SHIELD | Admitting: Neurology

## 2016-03-27 VITALS — BP 130/86 | HR 76 | Resp 18 | Ht 72.0 in | Wt 215.0 lb

## 2016-03-27 DIAGNOSIS — R4189 Other symptoms and signs involving cognitive functions and awareness: Secondary | ICD-10-CM

## 2016-03-27 DIAGNOSIS — G801 Spastic diplegic cerebral palsy: Secondary | ICD-10-CM

## 2016-03-27 DIAGNOSIS — G35 Multiple sclerosis: Secondary | ICD-10-CM | POA: Diagnosis not present

## 2016-03-27 DIAGNOSIS — Z79899 Other long term (current) drug therapy: Secondary | ICD-10-CM | POA: Diagnosis not present

## 2016-03-27 DIAGNOSIS — R261 Paralytic gait: Secondary | ICD-10-CM | POA: Diagnosis not present

## 2016-03-27 NOTE — Progress Notes (Addendum)
[facility]  PATIENT: Stephen Wade DOB: 01-27-1951  REFERRING DOCTOR OR PCP:  Willow Ora SOURCE: Patient and wife  _________________________________   HISTORICAL  CHIEF COMPLAINT:  Chief Complaint  Patient presents with  . Multiple Sclerosis    Here to discuss Ocrelizumab/fim    HISTORY OF PRESENT ILLNESS:  Stephen Wade is a 65 yo man who was diagnosed with MS in 2005.  He denoes any new symptoms since I saw him 5 -6 months ago.   He is on Tecfidera But wishes to consider agent therapy. Specifically he is interested in ocrelizumab. We discussed the pros and cons of staying on Tecfidera versus switching to ocrelizumab and also reviewed with the clinical study data for ocrelizumab.  He reports that his gait, strength, sensation and bladder issues are all unchanged from his last visit.  He continues to exercise daily with a reclining elliptical. Fatigue is tolerable. Mood and cognition have been stable.   MS History:  In 2005, he woke up with right leg weakness and clumsiness.  He had MRI's of the brain and spine consistent with MS.   He was originally seeing Dr. Kathrynn Ducking at [facility].   A repeat MRI showed progression allowing the diagnosis of MS to be made.    He started on Betaseron.  Since his first exacerbation, he has had progressive gait and cognitive dysfunction but these changes have mostly been gradual.     Due to worsening, he was placed on Tysabri but felt much worse while on it.   He was also JCV Ab positive so stopped after 4-6 doses.   He went back to Betaseron and a couple years later transferred care to me.   We changed to Tecfidera.   He tolerates tecfidera well with mild flushing but no GI symptoms.    He feels mostly stable on Tecfidera but has had some progression of the poor gait.  He now spends much of his time in a scooter.    He had one flare-up last year, though, where he felt gait changed  A few days of IV Solu-Medrol helped to get back to a baseline.        REVIEW OF SYSTEMS: Constitutional: No fevers, chills, sweats, or change in appetite Eyes: No visual changes, double vision, eye pain Ear, nose and throat: No hearing loss, ear pain, nasal congestion, sore throat Cardiovascular: No chest pain, palpitations.   Mild edema in legs. Respiratory: No shortness of breath at rest or with exertion.   No wheezes GastrointestinaI: No nausea, vomiting, diarrhea, abdominal pain, fecal incontinence Genitourinary: No dysuria, urinary retention or frequency.  No nocturia. Musculoskeletal: No neck pain, back pain Integumentary: No rash, pruritus, skin lesions Neurological: as above Psychiatric: as above Endocrine: No palpitations, diaphoresis, change in appetite, change in weigh or increased thirst Hematologic/Lymphatic: No anemia, purpura, petechiae. Allergic/Immunologic: No itchy/runny eyes, nasal congestion, recent allergic reactions, rashes  ALLERGIES: No Known Allergies  HOME MEDICATIONS:  Current outpatient prescriptions:  .  baclofen (LIORESAL) 10 MG tablet, TAKE 1 TABLET BY MOUTH EVERY MORNING AND 2 TABLETS AT BEDTIME, Disp: 90 tablet, Rfl: 6 .  Dimethyl Fumarate 240 MG CPDR, Take 1 capsule (240 mg total) by mouth 2 (two) times daily., Disp: 180 capsule, Rfl: 3 .  omeprazole (PRILOSEC) 20 MG capsule, Take 20 mg by mouth 2 (two) times daily., Disp: , Rfl:  .  Vitamin D, Ergocalciferol, (DRISDOL) 50000 units CAPS capsule, Take 1 capsule (50,000 Units total) by mouth every 7 (  seven) days. For 12 weeks. Then start OTC Vit D 4000-5000 units daily., Disp: 12 capsule, Rfl: 0 .  tamsulosin (FLOMAX) 0.4 MG CAPS capsule, Take 0.8 mg by mouth at bedtime., Disp: , Rfl: 5  PAST MEDICAL HISTORY: Past Medical History  Diagnosis Date  . MS (multiple sclerosis) (HCC) 2005  . Lower urinary tract symptoms (LUTS)     Dr Patsi Sears  . Urolithiasis 2011  . GERD (gastroesophageal reflux disease)     had on- off chest pain, symptoms resolved with  Prilosec 11/2010  . Urge incontinence of urine   . Testicular hypogonadism     PAST SURGICAL HISTORY: Past Surgical History  Procedure Laterality Date  . Appendectomy       ruptured 03-2010  . Vasectomy      FAMILY HISTORY: Family History  Problem Relation Age of Onset  . Heart attack Mother   . Brain cancer Father     SOCIAL HISTORY:  Social History   Social History  . Marital Status: Married    Spouse Name: N/A  . Number of Children: 3  . Years of Education: N/A   Occupational History  . Occupation: independent Health visitor    Social History Main Topics  . Smoking status: Never Smoker   . Smokeless tobacco: Not on file  . Alcohol Use: No     Comment: rare  . Drug Use: No  . Sexual Activity: Not on file   Other Topics Concern  . Not on file   Social History Narrative     PHYSICAL EXAM  Filed Vitals:   03/27/16 1513  BP: 130/86  Pulse: 76  Resp: 18  Height: 6' (1.829 m)  Weight: 215 lb (97.523 kg)    Body mass index is 29.15 kg/(m^2).   General: The patient is well-developed and well-nourished  Skin: Extremities are without significant edema.  Neurologic Exam  Mental status: The patient is alert and oriented x 3 at the time of the examination. The patient has apparent normal recent and remote memory, with a reduced  attention span and concentration ability.   Speech is mildly dysarthric.  Cranial nerves: Extraocular movements are full. .  Facial symmetry is present. There is good facial sensation to soft touch bilaterally.Facial strength is normal.  Trapezius and sternocleidomastoid strength is normal. No dysarthria is noted.  The tongue is midline, and the patient has symmetric elevation of the soft palate. No obvious hearing deficits are noted.  Motor:  Muscle bulk is normal.   Tone is increased, legs > arms, right > left (normal in left arm). Strength is  5 / 5 in left arm, 4+/5 in right arm prox and 4+-5/5 grip, 2/5 in left leg  hip extensors and 4-/5 distally, 2-/5 in right leg.   Sensory: Sensory testing is intact to pinprick, soft touch and vibration sensation in all 4 extremities.  Coordination: Cerebellar testing reveals good finger-nose-finger on the left, reduced on the right and he is unable heel-to-shin bilaterally.  Gait and station: Station needs bilat support.  Can not walk  Reflexes: Deep tendon reflexes are increased on the right bilaterally.       DIAGNOSTIC DATA (LABS, IMAGING, TESTING) - I reviewed patient records, labs, notes, testing and imaging myself where available.  Lab Results  Component Value Date   WBC 6.0 01/16/2016   HGB 15.6 02/10/2015   HCT 44.1 01/16/2016   MCV 87 01/16/2016   PLT 223 01/16/2016      Component Value Date/Time  NA 140 05/12/2014 1224   K 4.4 05/12/2014 1224   CL 105 05/12/2014 1224   CO2 28 05/12/2014 1224   GLUCOSE 84 05/12/2014 1224   BUN 18 05/12/2014 1224   CREATININE 0.8 05/12/2014 1224   CALCIUM 9.5 05/12/2014 1224   AST 23 01/18/2011 1330   ALT 25 01/18/2011 1330   GFRNONAA >90 03/17/2013 1135   GFRAA >90 03/17/2013 1135   Lab Results  Component Value Date   CHOL 207* 01/18/2011   HDL 31.10* 01/18/2011   LDLDIRECT 158.6 01/18/2011   TRIG 146.0 01/18/2011   CHOLHDL 7 01/18/2011   Lab Results  Component Value Date   HGBA1C 5.2 05/12/2014   No results found for: VITAMINB12 Lab Results  Component Value Date   TSH 1.20 05/12/2014       ASSESSMENT AND PLAN  MULTIPLE SCLEROSIS - Plan: Hepatic function panel, CBC with Differential/Platelet, Hep B Surface Antibody, Hepatitis B surface antigen, Hepatitis B Core AB, Total, Quantiferon tb gold assay (blood), Hep C Antibody  Spastic gait  Disturbed cognition  Spastic diplegia (HCC)  High risk medication use - Plan: Hepatic function panel, CBC with Differential/Platelet, Hep B Surface Antibody, Hepatitis B surface antigen, Hepatitis B Core AB, Total, Quantiferon tb gold assay  (blood), Hep C Antibody   1.   We discussed the benefits and risks of switching from Tecfidera to ocrelizumab. He wishes to change therapy.   We will check labs including hepatic function panel, CBC with differential, hepatitis B labs and TB serology.    Because it is a new medication, take a couple more weeks at expected or everything to occur behind the scenes and I want him to stay on Tecfidera for a few more weeks and then I will want him to wash out for 6 weeks.     2.   He will return to see [place] for the infusion or sooner if he has new or worsening neurologic symptoms.  30 minute Face-to-face interaction with greater than one half of the time counseling and coordinating care about his disease modifying therapy for MS.  Richard A. Epimenio Foot, MD, PhD 03/27/2016, 5:00 PM Certified in Neurology, Clinical Neurophysiology, Sleep Medicine, Pain Medicine and Neuroimaging  [facility] [address], [address] [place], [place] [address] [phone]  ___________________  ADDENDUM:   Preston Garabedian is a 65 year old man with a relapsing form of multiple sclerosis. I have discussed the risks and benefits of ocrelizumab with him. Hepatitis B and TB serology labs are negative. Therefore, he may begin ocrelizumab.  Richard A. Epimenio Foot, MD, PhD Certified in Neurology, Clinical Neurophysiology, Sleep Medicine, Pain Medicine and Neuroimaging  [facility] [address], [address] [place], [place] [address] [phone]

## 2016-03-28 LAB — CBC WITH DIFFERENTIAL/PLATELET
BASOS: 1 %
Basophils Absolute: 0.1 10*3/uL (ref 0.0–0.2)
EOS (ABSOLUTE): 0.2 10*3/uL (ref 0.0–0.4)
EOS: 4 %
HEMATOCRIT: 42.1 % (ref 37.5–51.0)
Hemoglobin: 14.4 g/dL (ref 12.6–17.7)
Immature Grans (Abs): 0 10*3/uL (ref 0.0–0.1)
Immature Granulocytes: 1 %
LYMPHS ABS: 1.2 10*3/uL (ref 0.7–3.1)
Lymphs: 21 %
MCH: 29.6 pg (ref 26.6–33.0)
MCHC: 34.2 g/dL (ref 31.5–35.7)
MCV: 86 fL (ref 79–97)
MONOS ABS: 0.6 10*3/uL (ref 0.1–0.9)
Monocytes: 10 %
NEUTROS ABS: 3.7 10*3/uL (ref 1.4–7.0)
NEUTROS PCT: 63 %
PLATELETS: 241 10*3/uL (ref 150–379)
RBC: 4.87 x10E6/uL (ref 4.14–5.80)
RDW: 13.5 % (ref 12.3–15.4)
WBC: 5.9 10*3/uL (ref 3.4–10.8)

## 2016-03-28 LAB — HEPATIC FUNCTION PANEL
ALBUMIN: 4.7 g/dL (ref 3.6–4.8)
ALK PHOS: 79 IU/L (ref 39–117)
ALT: 23 IU/L (ref 0–44)
AST: 18 IU/L (ref 0–40)
BILIRUBIN, DIRECT: 0.09 mg/dL (ref 0.00–0.40)
Bilirubin Total: 0.4 mg/dL (ref 0.0–1.2)
TOTAL PROTEIN: 6.9 g/dL (ref 6.0–8.5)

## 2016-03-28 LAB — HEPATITIS B SURFACE ANTIBODY,QUALITATIVE: Hep B Surface Ab, Qual: NONREACTIVE

## 2016-03-28 LAB — HEPATITIS B CORE ANTIBODY, TOTAL: HEP B C TOTAL AB: NEGATIVE

## 2016-03-28 LAB — HEPATITIS B SURFACE ANTIGEN: Hepatitis B Surface Ag: NEGATIVE

## 2016-03-28 LAB — HEPATITIS C ANTIBODY: Hep C Virus Ab: <0.1 s/co ratio (ref 0.0–0.9)

## 2016-04-01 ENCOUNTER — Telehealth: Payer: Self-pay | Admitting: *Deleted

## 2016-04-01 LAB — QUANTIFERON IN TUBE
QFT TB AG MINUS NIL VALUE: <0 IU/mL
QUANTIFERON MITOGEN VALUE: 2.09 IU/mL
QUANTIFERON TB AG VALUE: 0.06 [IU]/mL
QUANTIFERON TB GOLD: NEGATIVE
Quantiferon Nil Value: 0.15 IU/mL

## 2016-04-01 LAB — QUANTIFERON TB GOLD ASSAY (BLOOD)

## 2016-04-01 NOTE — Telephone Encounter (Signed)
-----   Message from Asa Lente, MD sent at 04/01/2016  4:05 PM EDT ----- His labs are fine. We can send in the ocrelizumab form.

## 2016-04-01 NOTE — Telephone Encounter (Signed)
I have spoken with Colon Branch and per RAS, advised that labs were good.  I have started Ocrelizumab srf and turned it in to [facility] in the infusion suite.  He verbalized understanding of same/fim

## 2016-04-02 ENCOUNTER — Encounter: Payer: Self-pay | Admitting: *Deleted

## 2016-05-01 ENCOUNTER — Telehealth: Payer: Self-pay | Admitting: Neurology

## 2016-05-01 MED ORDER — DIMETHYL FUMARATE 240 MG PO CPDR: 240.0000 mg | DELAYED_RELEASE_CAPSULE | Freq: Two times a day (BID) | ORAL | Status: DC

## 2016-05-01 NOTE — Telephone Encounter (Signed)
I have spoken with Stephen Wade this morning.  He sts. that after much though, he has decided to hold off on Ocrelizumab, and continue Tecfidera for now.  He has 2 concerns--1--he is worried about effects on his immune system.  With Ocrelizumab being a new med, he would like to see how other pt's respond to it first.  2--sts. after much research, he doesn't feel he will get any more benefit from it than he does from [place].  I have r/f Tecfidera to [facility] per his request, and will pass message along to RAS and to [facility] in the infusion suite/fim

## 2016-05-01 NOTE — Telephone Encounter (Signed)
Dr. Epimenio Foot aware, agreeable with pt. staying on Tecfidera/fim

## 2016-05-01 NOTE — Telephone Encounter (Signed)
Pt called wanting to speak with RN regarding new medication for MS

## 2016-06-05 ENCOUNTER — Observation Stay (HOSPITAL_COMMUNITY)
Admission: RE | Admit: 2016-06-05 | Payer: BLUE CROSS/BLUE SHIELD | Source: Ambulatory Visit | Admitting: Gastroenterology

## 2016-06-06 ENCOUNTER — Encounter (HOSPITAL_COMMUNITY): Admission: RE | Payer: Self-pay | Source: Ambulatory Visit

## 2016-06-06 SURGERY — ESOPHAGOGASTRODUODENOSCOPY (EGD) WITH PROPOFOL
Anesthesia: Monitor Anesthesia Care

## 2016-07-16 ENCOUNTER — Ambulatory Visit: Payer: BLUE CROSS/BLUE SHIELD | Admitting: Neurology

## 2016-07-28 ENCOUNTER — Telehealth: Payer: Self-pay | Admitting: Neurology

## 2016-08-06 MED ORDER — BACLOFEN 10 MG PO TABS: 10.0000 mg | ORAL_TABLET | Freq: Three times a day (TID) | ORAL | 3 refills | Status: DC

## 2016-08-06 NOTE — Telephone Encounter (Signed)
Cindy[facility] [phone] calling requesting 90 day supply for baclofen

## 2016-08-06 NOTE — Addendum Note (Signed)
Addended by: Candis SchatzMISENHEIMER, Nelle Sayed I on: 08/06/2016 09:23 AM   Modules accepted: Orders

## 2016-08-06 NOTE — Telephone Encounter (Signed)
90 day supply of Baclofen escribed to [facility] per request/fim

## 2016-09-06 LAB — PSA: PSA: 0.77

## 2016-09-17 ENCOUNTER — Ambulatory Visit (INDEPENDENT_AMBULATORY_CARE_PROVIDER_SITE_OTHER): Payer: BLUE CROSS/BLUE SHIELD | Admitting: Neurology

## 2016-09-17 ENCOUNTER — Ambulatory Visit: Payer: BLUE CROSS/BLUE SHIELD | Admitting: Neurology

## 2016-09-17 ENCOUNTER — Encounter: Payer: Self-pay | Admitting: Internal Medicine

## 2016-09-17 ENCOUNTER — Encounter: Payer: Self-pay | Admitting: Neurology

## 2016-09-17 VITALS — BP 146/84 | HR 74 | Resp 16 | Ht 72.0 in | Wt 215.0 lb

## 2016-09-17 DIAGNOSIS — G35 Multiple sclerosis: Secondary | ICD-10-CM | POA: Diagnosis not present

## 2016-09-17 DIAGNOSIS — G801 Spastic diplegic cerebral palsy: Secondary | ICD-10-CM | POA: Diagnosis not present

## 2016-09-17 DIAGNOSIS — R5383 Other fatigue: Secondary | ICD-10-CM

## 2016-09-17 DIAGNOSIS — R399 Unspecified symptoms and signs involving the genitourinary system: Secondary | ICD-10-CM | POA: Diagnosis not present

## 2016-09-17 NOTE — Progress Notes (Signed)
[facility]  PATIENT: Stephen Wade DOB: Aug 15, 1951  REFERRING DOCTOR OR PCP:  Willow Ora SOURCE: Patient and wife  _________________________________   HISTORICAL  CHIEF COMPLAINT:  Chief Complaint  Patient presents with  . Multiple Sclerosis    Sts. he continues to tolerate Tecfidera well.  Denies new or worsening sx.  Would like to further discuss Ocrelizumab/fim    HISTORY OF PRESENT ILLNESS:  Stephen Wade is a 65 yo man who was diagnosed with MS in 2005.  He feels stable on Tecfidera and has tolerated it better.  denies any new symptoms since I saw him 5 -6 months ago.   He is on Tecfidera But wishes to consider agent therapy. Specifically he is interested in ocrelizumab. We discussed the pros and cons of staying on Tecfidera versus switching to ocrelizumab and also reviewed with the clinical study data for ocrelizumab.  Gait, strength, sensation are about the same.   He is in a scooter much of the day.   He has more weakness in the right leg than right.   Has right > left leg spasticity , helped by baclofen.    He continues to exercise daily with a reclining elliptical.  Bladder/bowel:   He feels bladder issues are about the same.   He has frequency and incontinence.   He sees [name] and will be getting urodynamics  Fatigue is tolerable most days.   Worse in heat.  He sleeps well most nights.  Mood and cognition have been stable.   MS History:  In 2005, he woke up with right leg weakness and clumsiness.  He had MRI's of the brain and spine consistent with MS.   He was originally seeing Dr. Kathrynn Ducking at [facility].   A repeat MRI showed progression allowing the diagnosis of MS to be made.    He started on Betaseron.  Since his first exacerbation, he has had progressive gait and cognitive dysfunction but these changes have mostly been gradual.     Due to worsening, he was placed on Tysabri but felt much worse while on it.   He was also JCV Ab positive so stopped  after 4-6 do7ses.   He went back to Betaseron and a couple years later transferred care to me.   We changed to Tecfidera.   He tolerates tecfidera well with mild flushing but no GI symptoms.    He feels mostly stable on Tecfidera but has had some progression of the poor gait.  He now spends much of his time in a scooter.    He had one flare-up last year, though, where he felt gait changed  A few days of IV Solu-Medrol helped to get back to a baseline.      REVIEW OF SYSTEMS: Constitutional: No fevers, chills, sweats, or change in appetite Eyes: No visual changes, double vision, eye pain Ear, nose and throat: No hearing loss, ear pain, nasal congestion, sore throat Cardiovascular: No chest pain, palpitations.   Mild edema in legs. Respiratory: No shortness of breath at rest or with exertion.   No wheezes GastrointestinaI: No nausea, vomiting, diarrhea, abdominal pain, fecal incontinence Genitourinary: No dysuria, urinary retention or frequency.  No nocturia. Musculoskeletal: No neck pain, back pain Integumentary: No rash, pruritus, skin lesions Neurological: as above Psychiatric: as above Endocrine: No palpitations, diaphoresis, change in appetite, change in weigh or increased thirst Hematologic/Lymphatic: No anemia, purpura, petechiae. Allergic/Immunologic: No itchy/runny eyes, nasal congestion, recent allergic reactions, rashes  ALLERGIES: No Known  Allergies  HOME MEDICATIONS:  Current Outpatient Prescriptions:  .  Ascorbic Acid (VITAMIN C) 100 MG tablet, Take 100 mg by mouth daily., Disp: , Rfl:  .  baclofen (LIORESAL) 10 MG tablet, Take 1 tablet (10 mg total) by mouth 3 (three) times daily., Disp: 270 tablet, Rfl: 3 .  Dimethyl Fumarate 240 MG CPDR, Take 1 capsule (240 mg total) by mouth 2 (two) times daily., Disp: 180 capsule, Rfl: 3 .  NON FORMULARY, Inject 300 mg into the vein., Disp: , Rfl:  .  omeprazole (PRILOSEC) 20 MG capsule, Take 20 mg by mouth 2 (two) times daily.,  Disp: , Rfl:  .  tamsulosin (FLOMAX) 0.4 MG CAPS capsule, Take 0.8 mg by mouth at bedtime., Disp: , Rfl: 5 .  Vitamin D, Ergocalciferol, (DRISDOL) 50000 units CAPS capsule, Take 1 capsule (50,000 Units total) by mouth every 7 (seven) days. For 12 weeks. Then start OTC Vit D 4000-5000 units daily., Disp: 12 capsule, Rfl: 0  PAST MEDICAL HISTORY: Past Medical History:  Diagnosis Date  . GERD (gastroesophageal reflux disease)    had on- off chest pain, symptoms resolved with Prilosec 11/2010  . Lower urinary tract symptoms (LUTS)    Dr Patsi Sears  . MS (multiple sclerosis) (HCC) 2005  . Testicular hypogonadism   . Urge incontinence of urine   . Urolithiasis 2011    PAST SURGICAL HISTORY: Past Surgical History:  Procedure Laterality Date  . APPENDECTOMY      ruptured 03-2010  . VASECTOMY      FAMILY HISTORY: Family History  Problem Relation Age of Onset  . Heart attack Mother   . Brain cancer Father     SOCIAL HISTORY:  Social History   Social History  . Marital status: Married    Spouse name: N/A  . Number of children: 3  . Years of education: N/A   Occupational History  . Occupation: independent Secretary/administrator   Social History Main Topics  . Smoking status: Never Smoker  . Smokeless tobacco: Not on file  . Alcohol use No     Comment: rare  . Drug use: No  . Sexual activity: Not on file   Other Topics Concern  . Not on file   Social History Narrative  . No narrative on file     PHYSICAL EXAM  Vitals:   09/17/16 1351  BP: (!) 146/84  Pulse: 74  Resp: 16  Weight: 215 lb (97.5 kg)  Height: 6' (1.829 m)    Body mass index is 29.16 kg/m.   General: The patient is well-developed and well-nourished  Skin: Extremities are without significant edema.  Neurologic Exam  Mental status: The patient is alert and oriented x 3 at the time of the examination. The patient has apparent normal recent and remote memory, with a reduced   attention span and concentration ability.   Speech is mildly dysarthric.  Cranial nerves: Extraocular movements are full. .  Facial symmetry is present. Facial strength is normal.  Trapezius and sternocleidomastoid strength is normal. No dysarthria is noted.  The tongue is midline, and the patient has symmetric elevation of the soft palate. No obvious hearing deficits are noted.  Motor:  Muscle bulk is normal.   Tone is increased, legs > arms, right > left (normal in left arm). Strength is  5 / 5 in left arm, 4+/5 in right arm prox and 4+-5/5 grip, 2/5 in left leg hip extensors and 4-/5 distally, 2-/5 in right leg.  Sensory: Sensory testing is intact to temperature, touch and vibration sensation in all 4 extremities.  Coordination: Cerebellar testing reveals good finger-nose-finger on the left, reduced on the right  Gait and station:  Can not walk  Reflexes: Deep tendon reflexes are increased, more on the right.       DIAGNOSTIC DATA (LABS, IMAGING, TESTING) - I reviewed patient records, labs, notes, testing and imaging myself where available.  Lab Results  Component Value Date   WBC 5.9 03/27/2016   HGB 15.6 02/10/2015   HCT 42.1 03/27/2016   MCV 86 03/27/2016   PLT 241 03/27/2016      Component Value Date/Time   NA 140 05/12/2014 1224   K 4.4 05/12/2014 1224   CL 105 05/12/2014 1224   CO2 28 05/12/2014 1224   GLUCOSE 84 05/12/2014 1224   BUN 18 05/12/2014 1224   CREATININE 0.8 05/12/2014 1224   CALCIUM 9.5 05/12/2014 1224   PROT 6.9 03/27/2016 1618   ALBUMIN 4.7 03/27/2016 1618   AST 18 03/27/2016 1618   ALT 23 03/27/2016 1618   ALKPHOS 79 03/27/2016 1618   BILITOT 0.4 03/27/2016 1618   GFRNONAA >90 03/17/2013 1135   GFRAA >90 03/17/2013 1135   Lab Results  Component Value Date   CHOL 207 (H) 01/18/2011   HDL 31.10 (L) 01/18/2011   LDLDIRECT 158.6 01/18/2011   TRIG 146.0 01/18/2011   CHOLHDL 7 01/18/2011   Lab Results  Component Value Date   HGBA1C 5.2  05/12/2014   No results found for: VITAMINB12 Lab Results  Component Value Date   TSH 1.20 05/12/2014       ASSESSMENT AND PLAN  MULTIPLE SCLEROSIS - Plan: CBC with Differential/Platelet, Stratify JCV Antibody Test ([facility])  Spastic diplegia (HCC)  Other fatigue  Lower urinary tract symptoms (LUTS)   1.   For now, he wishes to stay on Tecfidera but is strongly considering switching to ocrelizumab sometime next year once more people have been on it and there is more safety info.   Check CBC with differential and JCV Ab 2.   Continue other medications. 3.   He will return to see me in 6 months or sooner if there are new or worsening neurologic symptoms.  Angeleah Labrake A. Epimenio FootSater, MD, PhD 09/17/2016, 2:54 PM Certified in Neurology, Clinical Neurophysiology, Sleep Medicine, Pain Medicine and Neuroimaging  [facility] [address], [address] [place], [place] [address] [phone]

## 2016-09-18 ENCOUNTER — Telehealth: Payer: Self-pay | Admitting: *Deleted

## 2016-09-18 LAB — CBC WITH DIFFERENTIAL/PLATELET
BASOS ABS: 0 10*3/uL (ref 0.0–0.2)
Basos: 0 %
EOS (ABSOLUTE): 0.1 10*3/uL (ref 0.0–0.4)
EOS: 2 %
HEMOGLOBIN: 14.9 g/dL (ref 12.6–17.7)
Hematocrit: 42.2 % (ref 37.5–51.0)
IMMATURE GRANS (ABS): 0 10*3/uL (ref 0.0–0.1)
IMMATURE GRANULOCYTES: 1 %
LYMPHS: 16 %
Lymphocytes Absolute: 1.1 10*3/uL (ref 0.7–3.1)
MCH: 30.1 pg (ref 26.6–33.0)
MCHC: 35.3 g/dL (ref 31.5–35.7)
MCV: 85 fL (ref 79–97)
MONOCYTES: 12 %
Monocytes Absolute: 0.8 10*3/uL (ref 0.1–0.9)
NEUTROS PCT: 69 %
Neutrophils Absolute: 4.7 10*3/uL (ref 1.4–7.0)
Platelets: 224 10*3/uL (ref 150–379)
RBC: 4.95 x10E6/uL (ref 4.14–5.80)
RDW: 13.4 % (ref 12.3–15.4)
WBC: 6.7 10*3/uL (ref 3.4–10.8)

## 2016-09-18 NOTE — Telephone Encounter (Signed)
-----   Message from Asa Lenteichard A Sater, MD sent at 09/18/2016  8:52 AM EDT ----- Please let him know that the blood count is spine.

## 2016-09-18 NOTE — Telephone Encounter (Signed)
I have spoken with Verlon AuLeslie this morning and per RAS, advised the labs Colon BranchCarson had done in our office yesterday were ok--bd ct. is fine.  She verbalized understanding of same/fim

## 2016-09-26 ENCOUNTER — Encounter: Payer: Self-pay | Admitting: *Deleted

## 2017-03-18 ENCOUNTER — Ambulatory Visit: Payer: BLUE CROSS/BLUE SHIELD | Admitting: Neurology

## 2017-04-09 ENCOUNTER — Other Ambulatory Visit: Payer: Self-pay | Admitting: Neurology

## 2017-05-03 ENCOUNTER — Other Ambulatory Visit: Payer: Self-pay | Admitting: Neurology

## 2017-05-06 ENCOUNTER — Telehealth: Payer: Self-pay | Admitting: Neurology

## 2017-05-06 NOTE — Telephone Encounter (Signed)
Keisha/[facility] [phone] called request RX for TECFIDERA 240 MG CPDR sent to [facility]

## 2017-05-06 NOTE — Telephone Encounter (Signed)
Rx. has been sent to [facility] in [place]/fim

## 2017-05-26 ENCOUNTER — Ambulatory Visit (INDEPENDENT_AMBULATORY_CARE_PROVIDER_SITE_OTHER): Payer: BLUE CROSS/BLUE SHIELD | Admitting: Neurology

## 2017-05-26 ENCOUNTER — Telehealth: Payer: Self-pay | Admitting: Neurology

## 2017-05-26 ENCOUNTER — Encounter: Payer: Self-pay | Admitting: Neurology

## 2017-05-26 VITALS — BP 140/76 | HR 72 | Ht 72.0 in | Wt 215.0 lb

## 2017-05-26 DIAGNOSIS — L98429 Non-pressure chronic ulcer of back with unspecified severity: Secondary | ICD-10-CM

## 2017-05-26 DIAGNOSIS — G35 Multiple sclerosis: Secondary | ICD-10-CM

## 2017-05-26 DIAGNOSIS — G801 Spastic diplegic cerebral palsy: Secondary | ICD-10-CM | POA: Diagnosis not present

## 2017-05-26 DIAGNOSIS — R5383 Other fatigue: Secondary | ICD-10-CM

## 2017-05-26 NOTE — Telephone Encounter (Signed)
Pt's wife said he's a had a bad couple of weeks and is wanting to know if he could have IV steroids. Please call asap

## 2017-05-26 NOTE — Progress Notes (Signed)
[facility]  PATIENT: Stephen Wade DOB: 11-Feb-1951  REFERRING DOCTOR OR PCP:  Willow Ora SOURCE: Patient and wife  _________________________________   HISTORICAL  CHIEF COMPLAINT:  Chief Complaint  Patient presents with  . Multiple Sclerosis    Sts. he continues to tolerate Tecfidera well.  Sts. over the last couple of weeks, generalized strength, gait/balance and bladder function are worse./fim    HISTORY OF PRESENT ILLNESS:  Stephen Wade is a 66 yo man who was diagnosed with MS in 2005.  He is feeling generally weaker with worse balance and more trouble standing up.   This came on about 2 weeks ago and he feels some of the changes occurred overnight.     MS:   He is currently on Tecfidera. He tolerates it well. We have previously discussed ocrelizumab as he had some interest in switching therapies.    We discussed the pros and cons of staying on Tecfidera versus switching to ocrelizumab and also reviewed with the clinical study data for ocrelizumab.  Gait, strength, sensation are about the same.   He is in a scooter much of the day.   He notes more more leg weakness the past 2 weeks.    The right leg is always worse.   He continues to exercise daily with a reclining elliptical.  Bladder/bowel:   He is now having much more urinary frequency and urgency.    He has more incontinence.      He sees [name] and he has recommended urodynamics and maybe a transurethral prostate procedure.      Fatigue/sleep:   Fatigue is tolerable most days.   Worse in heat.  He sleeps worse due to his bladder.     Mood and cognition have been stable.  He has mild skin breakdown in the sacral area.  His wife applies silver pads.   He has seen wound care in the past and we discussed him getting back in.   [facility]).      MS History:  In 2005, he woke up with right leg weakness and clumsiness.  He had MRI's of the brain and spine consistent with MS.   He was originally seeing  Dr. Kathrynn Ducking at [facility].   A repeat MRI showed progression allowing the diagnosis of MS to be made.    He started on Betaseron.  Since his first exacerbation, he has had progressive gait and cognitive dysfunction but these changes have mostly been gradual.     Due to worsening, he was placed on Tysabri but felt much worse while on it.   He was also JCV Ab positive so stopped after 4-6 do7ses.   He went back to Betaseron and a couple years later transferred care to me.   We changed to Tecfidera.   He tolerates tecfidera well with mild flushing but no GI symptoms.    He feels mostly stable on Tecfidera but has had some progression of the poor gait.  He now spends much of his time in a scooter.    He had one flare-up last year, though, where he felt gait changed  A few days of IV Solu-Medrol helped to get back to a baseline.      REVIEW OF SYSTEMS: Constitutional: No fevers, chills, sweats, or change in appetite Eyes: No visual changes, double vision, eye pain Ear, nose and throat: No hearing loss, ear pain, nasal congestion, sore throat Cardiovascular: No chest pain, palpitations.   Mild edema in  legs. Respiratory: No shortness of breath at rest or with exertion.   No wheezes GastrointestinaI: No nausea, vomiting, diarrhea, abdominal pain, fecal incontinence Genitourinary: No dysuria, urinary retention or frequency.  No nocturia. Musculoskeletal: No neck pain, back pain Integumentary: No rash, pruritus, skin lesions Neurological: as above Psychiatric: as above Endocrine: No palpitations, diaphoresis, change in appetite, change in weigh or increased thirst Hematologic/Lymphatic: No anemia, purpura, petechiae. Allergic/Immunologic: No itchy/runny eyes, nasal congestion, recent allergic reactions, rashes  ALLERGIES: No Known Allergies  HOME MEDICATIONS:  Current Outpatient Prescriptions:  .  Ascorbic Acid (VITAMIN C) 100 MG tablet, Take 100 mg by mouth daily., Disp: , Rfl:  .  baclofen  (LIORESAL) 10 MG tablet, Take 1 tablet (10 mg total) by mouth 3 (three) times daily., Disp: 270 tablet, Rfl: 3 .  NON FORMULARY, Inject 300 mg into the vein., Disp: , Rfl:  .  omeprazole (PRILOSEC) 20 MG capsule, Take 20 mg by mouth 2 (two) times daily., Disp: , Rfl:  .  tamsulosin (FLOMAX) 0.4 MG CAPS capsule, Take 0.8 mg by mouth at bedtime., Disp: , Rfl: 5 .  TECFIDERA 240 MG CPDR, TAKE 1 CAPSULE (240 MG TOTAL) BY MOUTH 2 (TWO) TIMES DAILY., Disp: 60 capsule, Rfl: 2  PAST MEDICAL HISTORY: Past Medical History:  Diagnosis Date  . GERD (gastroesophageal reflux disease)    had on- off chest pain, symptoms resolved with Prilosec 11/2010  . Lower urinary tract symptoms (LUTS)    Dr Patsi Sears  . MS (multiple sclerosis) (HCC) 2005  . Testicular hypogonadism   . Urge incontinence of urine   . Urolithiasis 2011    PAST SURGICAL HISTORY: Past Surgical History:  Procedure Laterality Date  . APPENDECTOMY      ruptured 03-2010  . VASECTOMY      FAMILY HISTORY: Family History  Problem Relation Age of Onset  . Heart attack Mother   . Brain cancer Father     SOCIAL HISTORY:  Social History   Social History  . Marital status: Married    Spouse name: N/A  . Number of children: 3  . Years of education: N/A   Occupational History  . Occupation: independent Secretary/administrator   Social History Main Topics  . Smoking status: Never Smoker  . Smokeless tobacco: Never Used  . Alcohol use No     Comment: rare  . Drug use: No  . Sexual activity: Not on file   Other Topics Concern  . Not on file   Social History Narrative  . No narrative on file     PHYSICAL EXAM  Vitals:   05/26/17 1303  BP: 140/76  Pulse: 72  Weight: 215 lb (97.5 kg)  Height: 6' (1.829 m)    Body mass index is 29.16 kg/m.   General: The patient is well-developed and well-nourished  Skin: Extremities are without rash and he has mild pedal edema.   He has redness in the  sacral area with mild ulceration.  Neurologic Exam  Mental status: The patient is alert and oriented x 3 at the time of the examination. The patient has apparent normal recent and remote memory, with a reduced  attention span and concentration ability.   Speech is mildly dysarthric.  Cranial nerves: Extraocular movements are full. .  Facial symmetry is present. Facial strength is normal.  Trapezius and sternocleidomastoid strength is normal. No dysarthria is noted.  The tongue is midline, and the patient has symmetric elevation of the soft palate. No obvious  hearing deficits are noted.  Motor:  Muscle bulk is normal.   Tone is increased, legs > arms, right > left (normal in left arm). Strength is  5 / 5 in left arm, 4+/5 in right arm prox and 4+-5/5 grip, 2/5 in left leg hip extensors and 4-/5 distally, 2-/5 in right leg.   Sensory: Sensory testing is intact to temperature, touch and vibration sensation in all 4 extremities.  Coordination: Cerebellar testing reveals good finger-nose-finger on the left, reduced on the right  Gait and station:  Can not walk  Reflexes: Deep tendon reflexes are increased, more on the right.       DIAGNOSTIC DATA (LABS, IMAGING, TESTING) - I reviewed patient records, labs, notes, testing and imaging myself where available.  Lab Results  Component Value Date   WBC 6.7 09/17/2016   HGB 15.6 02/10/2015   HCT 42.2 09/17/2016   MCV 85 09/17/2016   PLT 224 09/17/2016      Component Value Date/Time   NA 140 05/12/2014 1224   K 4.4 05/12/2014 1224   CL 105 05/12/2014 1224   CO2 28 05/12/2014 1224   GLUCOSE 84 05/12/2014 1224   BUN 18 05/12/2014 1224   CREATININE 0.8 05/12/2014 1224   CALCIUM 9.5 05/12/2014 1224   PROT 6.9 03/27/2016 1618   ALBUMIN 4.7 03/27/2016 1618   AST 18 03/27/2016 1618   ALT 23 03/27/2016 1618   ALKPHOS 79 03/27/2016 1618   BILITOT 0.4 03/27/2016 1618   GFRNONAA >90 03/17/2013 1135   GFRAA >90 03/17/2013 1135   Lab Results    Component Value Date   CHOL 207 (H) 01/18/2011   HDL 31.10 (L) 01/18/2011   LDLDIRECT 158.6 01/18/2011   TRIG 146.0 01/18/2011   CHOLHDL 7 01/18/2011   Lab Results  Component Value Date   HGBA1C 5.2 05/12/2014   No results found for: [id number] Lab Results  Component Value Date   TSH 1.20 05/12/2014       ASSESSMENT AND PLAN  MULTIPLE SCLEROSIS - Plan: AMB referral to wound care center, CBC with Differential/Platelet, Comprehensive metabolic panel  Spastic diplegia (HCC)  Other fatigue  Skin ulcer of sacrum, unspecified ulcer stage (HCC) - Plan: AMB referral to wound care center   1.   Continue Tecfidera.   Check CBC with differential and CMP 2.   Continue other medications. 3.   Solu-Medrol 1 g IV 1 day. I am reluctant to do a longer course as he has some sacral skin breakdown.  4.    He has some skin breakdown in the sacral region and I am concerned that his current worsening could be due to infection. I've asked him to see wound care.  5.   He will return to see me in 6 months or sooner if there are new or worsening neurologic symptoms.  Candido Flott A. Epimenio Foot, MD, PhD 05/26/2017, 2:12 PM Certified in Neurology, Clinical Neurophysiology, Sleep Medicine, Pain Medicine and Neuroimaging  [facility] [address], [address] [place], [place] [address] [phone]

## 2017-05-26 NOTE — Telephone Encounter (Signed)
I spoke with Leslie--she sts. Colon Branch has had increased leg weakness over the last couple of weeks, wants to see RAS and possibly have IV SM if appropriate.  Appt. given this afternoon/fim

## 2017-05-27 ENCOUNTER — Telehealth: Payer: Self-pay | Admitting: *Deleted

## 2017-05-27 LAB — COMPREHENSIVE METABOLIC PANEL
A/G RATIO: 1.6 (ref 1.2–2.2)
ALT: 34 IU/L (ref 0–44)
AST: 22 IU/L (ref 0–40)
Albumin: 4.4 g/dL (ref 3.6–4.8)
Alkaline Phosphatase: 91 IU/L (ref 39–117)
BILIRUBIN TOTAL: 0.4 mg/dL (ref 0.0–1.2)
BUN / CREAT RATIO: 39 — AB (ref 10–24)
BUN: 22 mg/dL (ref 8–27)
CO2: 30 mmol/L — ABNORMAL HIGH (ref 18–29)
Calcium: 10.1 mg/dL (ref 8.6–10.2)
Chloride: 102 mmol/L (ref 96–106)
Creatinine, Ser: 0.57 mg/dL — ABNORMAL LOW (ref 0.76–1.27)
GFR calc non Af Amer: 109 mL/min/{1.73_m2} (ref >59)
GFR, EST AFRICAN AMERICAN: 125 mL/min/{1.73_m2} (ref >59)
GLOBULIN, TOTAL: 2.8 g/dL (ref 1.5–4.5)
Glucose: 111 mg/dL — ABNORMAL HIGH (ref 65–99)
POTASSIUM: 4.6 mmol/L (ref 3.5–5.2)
SODIUM: 144 mmol/L (ref 134–144)
TOTAL PROTEIN: 7.2 g/dL (ref 6.0–8.5)

## 2017-05-27 LAB — CBC WITH DIFFERENTIAL/PLATELET
BASOS: 0 %
Basophils Absolute: 0 10*3/uL (ref 0.0–0.2)
EOS (ABSOLUTE): 0.2 10*3/uL (ref 0.0–0.4)
Eos: 2 %
Hematocrit: 41.1 % (ref 37.5–51.0)
Hemoglobin: 14 g/dL (ref 13.0–17.7)
IMMATURE GRANS (ABS): 0 10*3/uL (ref 0.0–0.1)
Immature Granulocytes: 0 %
LYMPHS: 15 %
Lymphocytes Absolute: 1.4 10*3/uL (ref 0.7–3.1)
MCH: 30.1 pg (ref 26.6–33.0)
MCHC: 34.1 g/dL (ref 31.5–35.7)
MCV: 88 fL (ref 79–97)
MONOS ABS: 0.9 10*3/uL (ref 0.1–0.9)
Monocytes: 10 %
NEUTROS ABS: 6.4 10*3/uL (ref 1.4–7.0)
Neutrophils: 73 %
PLATELETS: 263 10*3/uL (ref 150–379)
RBC: 4.65 x10E6/uL (ref 4.14–5.80)
RDW: 13.5 % (ref 12.3–15.4)
WBC: 8.9 10*3/uL (ref 3.4–10.8)

## 2017-05-27 NOTE — Telephone Encounter (Signed)
-----   Message from Asa Lente, MD sent at 05/27/2017  9:36 AM EDT ----- Please let the patient know that the lab work is fine.

## 2017-05-27 NOTE — Telephone Encounter (Signed)
LMOM (identified vm) that per RAS, lab work done in our office yesterday is fine.  He does not need to return this call unless he has questions/fim 

## 2017-06-04 ENCOUNTER — Encounter (HOSPITAL_BASED_OUTPATIENT_CLINIC_OR_DEPARTMENT_OTHER): Payer: BLUE CROSS/BLUE SHIELD | Attending: Surgery

## 2017-06-04 DIAGNOSIS — Z993 Dependence on wheelchair: Secondary | ICD-10-CM | POA: Insufficient documentation

## 2017-06-04 DIAGNOSIS — G35 Multiple sclerosis: Secondary | ICD-10-CM | POA: Insufficient documentation

## 2017-06-04 DIAGNOSIS — G8220 Paraplegia, unspecified: Secondary | ICD-10-CM | POA: Diagnosis not present

## 2017-06-04 DIAGNOSIS — L258 Unspecified contact dermatitis due to other agents: Secondary | ICD-10-CM | POA: Diagnosis not present

## 2017-06-04 DIAGNOSIS — L89313 Pressure ulcer of right buttock, stage 3: Secondary | ICD-10-CM | POA: Diagnosis not present

## 2017-06-04 DIAGNOSIS — L89323 Pressure ulcer of left buttock, stage 3: Secondary | ICD-10-CM | POA: Insufficient documentation

## 2017-06-11 ENCOUNTER — Ambulatory Visit: Payer: BLUE CROSS/BLUE SHIELD | Admitting: Neurology

## 2017-06-12 DIAGNOSIS — L89323 Pressure ulcer of left buttock, stage 3: Secondary | ICD-10-CM | POA: Diagnosis not present

## 2017-06-16 ENCOUNTER — Telehealth: Payer: Self-pay | Admitting: Neurology

## 2017-06-16 DIAGNOSIS — R252 Cramp and spasm: Secondary | ICD-10-CM

## 2017-06-16 DIAGNOSIS — R269 Unspecified abnormalities of gait and mobility: Secondary | ICD-10-CM

## 2017-06-16 DIAGNOSIS — Z993 Dependence on wheelchair: Secondary | ICD-10-CM

## 2017-06-16 DIAGNOSIS — G35 Multiple sclerosis: Secondary | ICD-10-CM

## 2017-06-16 NOTE — Telephone Encounter (Signed)
Pt wife calling asking for a home eval for pt M/S. Pt wife said pt is increasingly needing more help, please call

## 2017-06-17 NOTE — Telephone Encounter (Signed)
I have spoken with Stephen Wade this morning.  She sts. she was advised to have someone come into their home to eval. for handicap accessibility. I have offered PT referral via home health, but they were thinking more along the lines of a contractor coming in to evaluate changes that may benefit them.  This is not something RAS can order; will have to be handled privately/fim

## 2017-06-19 NOTE — Telephone Encounter (Signed)
I have spoken with Stephen Wade. She sts. [employer] advised referral for home health is needed.  Referral made/fim

## 2017-06-19 NOTE — Telephone Encounter (Signed)
Patient wife called stating she would like a referral sent for home health care.

## 2017-06-19 NOTE — Addendum Note (Signed)
Addended by: Candis Schatz I on: 06/19/2017 02:14 PM   Modules accepted: Orders

## 2017-06-20 DIAGNOSIS — L89323 Pressure ulcer of left buttock, stage 3: Secondary | ICD-10-CM | POA: Diagnosis not present

## 2017-06-23 ENCOUNTER — Telehealth: Payer: Self-pay | Admitting: *Deleted

## 2017-06-23 NOTE — Telephone Encounter (Signed)
I have spoken with Stephen Wade this morning.  A while back, he discussed Ocrevus with RAS, and decided he would like to switch from Tecfidera to [place].  He changed his mind, but srf form had already been turned in; he has been approved by ins. to switch meds if he likes.  I have advised him that he certainly can stay on the Tecfidera.  Knowning that he is approved by ins., he sts. he will discuss with wife and call back./fim

## 2017-06-23 NOTE — Telephone Encounter (Signed)
Patient called office in reference to wanting to proceed with the new medication Ocrevus.  Thursdays work better for patient for scheduling infusions, and patient would like to begin next Thursday if possible.  Please call

## 2017-06-23 NOTE — Telephone Encounter (Signed)
Message printed and given to Mindy in the infusion suite/fim 

## 2017-06-27 ENCOUNTER — Encounter (HOSPITAL_BASED_OUTPATIENT_CLINIC_OR_DEPARTMENT_OTHER): Payer: BLUE CROSS/BLUE SHIELD | Attending: Internal Medicine

## 2017-06-27 DIAGNOSIS — L89323 Pressure ulcer of left buttock, stage 3: Secondary | ICD-10-CM | POA: Diagnosis not present

## 2017-06-27 DIAGNOSIS — G35 Multiple sclerosis: Secondary | ICD-10-CM | POA: Diagnosis not present

## 2017-06-27 DIAGNOSIS — G8220 Paraplegia, unspecified: Secondary | ICD-10-CM | POA: Insufficient documentation

## 2017-06-27 DIAGNOSIS — Z993 Dependence on wheelchair: Secondary | ICD-10-CM | POA: Insufficient documentation

## 2017-06-27 DIAGNOSIS — L89313 Pressure ulcer of right buttock, stage 3: Secondary | ICD-10-CM | POA: Insufficient documentation

## 2017-06-30 ENCOUNTER — Telehealth: Payer: Self-pay

## 2017-06-30 NOTE — Telephone Encounter (Signed)
I have spoken with Colon Branch this afternoon.  Tina/Mindy called him this afternoon--he is sched. for part A of 1st Ocrevus infusion/fim

## 2017-06-30 NOTE — Telephone Encounter (Signed)
Pt called to let Faith know that he would like to change from Tecfidera to Ocrevus. Let him know that information was given to infusion RN last week. Reports that he has not yet received a call to schedule. Informed pt that it sometimes takes a while for insurance approval and scheduling. He verbalized understanding and would like a call back w/ update. Spoke to Matilde Haymaker RN, who said that pt was approved on Friday and she would call pt today to get him scheduled for infusion.

## 2017-07-04 DIAGNOSIS — L89323 Pressure ulcer of left buttock, stage 3: Secondary | ICD-10-CM | POA: Diagnosis not present

## 2017-07-07 NOTE — Telephone Encounter (Signed)
Laketha from [facility] called to inform that she just spoke with pt and he informed her that he is no longer going to take Tecfidera, she said they are discharging it but if this is not correct to please call them and pt as well.  I made her aware of the entry from 06-30-2017@2 :03pm

## 2017-07-07 NOTE — Telephone Encounter (Signed)
Noted/fim 

## 2017-07-18 DIAGNOSIS — L89323 Pressure ulcer of left buttock, stage 3: Secondary | ICD-10-CM | POA: Diagnosis not present

## 2017-08-01 ENCOUNTER — Encounter (HOSPITAL_BASED_OUTPATIENT_CLINIC_OR_DEPARTMENT_OTHER): Payer: BLUE CROSS/BLUE SHIELD | Attending: Internal Medicine

## 2017-08-01 DIAGNOSIS — Z993 Dependence on wheelchair: Secondary | ICD-10-CM | POA: Diagnosis not present

## 2017-08-01 DIAGNOSIS — Z872 Personal history of diseases of the skin and subcutaneous tissue: Secondary | ICD-10-CM | POA: Insufficient documentation

## 2017-08-01 DIAGNOSIS — Z09 Encounter for follow-up examination after completed treatment for conditions other than malignant neoplasm: Secondary | ICD-10-CM | POA: Insufficient documentation

## 2017-08-01 DIAGNOSIS — G35 Multiple sclerosis: Secondary | ICD-10-CM | POA: Diagnosis not present

## 2017-08-04 ENCOUNTER — Encounter (HOSPITAL_BASED_OUTPATIENT_CLINIC_OR_DEPARTMENT_OTHER): Payer: BLUE CROSS/BLUE SHIELD

## 2017-10-07 ENCOUNTER — Other Ambulatory Visit: Payer: Self-pay | Admitting: Neurology

## 2017-11-04 ENCOUNTER — Inpatient Hospital Stay (HOSPITAL_COMMUNITY): Payer: BLUE CROSS/BLUE SHIELD

## 2017-11-04 ENCOUNTER — Other Ambulatory Visit: Payer: Self-pay

## 2017-11-04 ENCOUNTER — Inpatient Hospital Stay (HOSPITAL_COMMUNITY)
Admission: EM | Admit: 2017-11-04 | Discharge: 2017-11-06 | DRG: 059 | Disposition: A | Discharge status: Home / Self Care | Payer: BLUE CROSS/BLUE SHIELD | Attending: Family Medicine | Admitting: Family Medicine

## 2017-11-04 ENCOUNTER — Encounter (HOSPITAL_COMMUNITY): Payer: Self-pay | Admitting: *Deleted

## 2017-11-04 ENCOUNTER — Telehealth: Payer: Self-pay | Admitting: *Deleted

## 2017-11-04 DIAGNOSIS — Z5329 Procedure and treatment not carried out because of patient's decision for other reasons: Secondary | ICD-10-CM

## 2017-11-04 DIAGNOSIS — K219 Gastro-esophageal reflux disease without esophagitis: Secondary | ICD-10-CM | POA: Diagnosis present

## 2017-11-04 DIAGNOSIS — R29810 Facial weakness: Secondary | ICD-10-CM | POA: Diagnosis present

## 2017-11-04 DIAGNOSIS — N390 Urinary tract infection, site not specified: Secondary | ICD-10-CM | POA: Diagnosis present

## 2017-11-04 DIAGNOSIS — G35 Multiple sclerosis: Secondary | ICD-10-CM | POA: Diagnosis not present

## 2017-11-04 DIAGNOSIS — R531 Weakness: Secondary | ICD-10-CM

## 2017-11-04 DIAGNOSIS — Z79899 Other long term (current) drug therapy: Secondary | ICD-10-CM | POA: Diagnosis not present

## 2017-11-04 DIAGNOSIS — N3000 Acute cystitis without hematuria: Secondary | ICD-10-CM

## 2017-11-04 DIAGNOSIS — L8990 Pressure ulcer of unspecified site, unspecified stage: Secondary | ICD-10-CM | POA: Diagnosis present

## 2017-11-04 DIAGNOSIS — R5383 Other fatigue: Secondary | ICD-10-CM

## 2017-11-04 DIAGNOSIS — G35D Multiple sclerosis, unspecified: Secondary | ICD-10-CM | POA: Diagnosis present

## 2017-11-04 LAB — CBC WITH DIFFERENTIAL/PLATELET
BASOS PCT: 0 %
Basophils Absolute: 0 10*3/uL (ref 0.0–0.1)
EOS ABS: 0 10*3/uL (ref 0.0–0.7)
Eosinophils Relative: 0 %
HCT: 43.1 % (ref 39.0–52.0)
HEMOGLOBIN: 14.9 g/dL (ref 13.0–17.0)
LYMPHS ABS: 0.7 10*3/uL (ref 0.7–4.0)
Lymphocytes Relative: 9 %
MCH: 30.2 pg (ref 26.0–34.0)
MCHC: 34.6 g/dL (ref 30.0–36.0)
MCV: 87.4 fL (ref 78.0–100.0)
Monocytes Absolute: 1.4 10*3/uL — ABNORMAL HIGH (ref 0.1–1.0)
Monocytes Relative: 18 %
NEUTROS PCT: 73 %
Neutro Abs: 5.8 10*3/uL (ref 1.7–7.7)
Platelets: 182 10*3/uL (ref 150–400)
RBC: 4.93 MIL/uL (ref 4.22–5.81)
RDW: 14.2 % (ref 11.5–15.5)
WBC: 7.9 10*3/uL (ref 4.0–10.5)

## 2017-11-04 LAB — COMPREHENSIVE METABOLIC PANEL
ALBUMIN: 4.4 g/dL (ref 3.5–5.0)
ALK PHOS: 99 U/L (ref 38–126)
ALT: 34 U/L (ref 17–63)
ANION GAP: 9 (ref 5–15)
AST: 30 U/L (ref 15–41)
BUN: 18 mg/dL (ref 6–20)
CALCIUM: 9.6 mg/dL (ref 8.9–10.3)
CO2: 27 mmol/L (ref 22–32)
CREATININE: 0.78 mg/dL (ref 0.61–1.24)
Chloride: 104 mmol/L (ref 101–111)
GFR calc Af Amer: >60 mL/min (ref >60)
GFR calc non Af Amer: >60 mL/min (ref >60)
GLUCOSE: 101 mg/dL — AB (ref 65–99)
Potassium: 4.2 mmol/L (ref 3.5–5.1)
SODIUM: 140 mmol/L (ref 135–145)
Total Bilirubin: 1.3 mg/dL — ABNORMAL HIGH (ref 0.3–1.2)
Total Protein: 8 g/dL (ref 6.5–8.1)

## 2017-11-04 LAB — URINALYSIS, ROUTINE W REFLEX MICROSCOPIC
BILIRUBIN URINE: NEGATIVE
Glucose, UA: NEGATIVE mg/dL
Hgb urine dipstick: NEGATIVE
Ketones, ur: 20 mg/dL — AB
Nitrite: POSITIVE — AB
Protein, ur: NEGATIVE mg/dL
SPECIFIC GRAVITY, URINE: 1.019 (ref 1.005–1.030)
pH: 6 (ref 5.0–8.0)

## 2017-11-04 MED ORDER — GADOBENATE DIMEGLUMINE 529 MG/ML IV SOLN
20.0000 mL | Freq: Once | INTRAVENOUS | Status: AC | PRN
  Administered 2017-11-04: 19 mL via INTRAVENOUS

## 2017-11-04 MED ORDER — TAMSULOSIN HCL 0.4 MG PO CAPS
0.8000 mg | ORAL_CAPSULE | Freq: Every day | ORAL | Status: DC
  Administered 2017-11-04 – 2017-11-05 (×2): 0.8 mg via ORAL
  Filled 2017-11-04 (×3): qty 2

## 2017-11-04 MED ORDER — ACETAMINOPHEN 325 MG PO TABS: 650.0000 mg | ORAL_TABLET | Freq: Four times a day (QID) | ORAL | Status: DC | PRN

## 2017-11-04 MED ORDER — SODIUM CHLORIDE 0.9 % IV SOLN
1000.0000 mg | Freq: Once | INTRAVENOUS | Status: AC
  Administered 2017-11-04: 1000 mg via INTRAVENOUS
  Filled 2017-11-04: qty 8

## 2017-11-04 MED ORDER — METHYLPREDNISOLONE SODIUM SUCC 1000 MG IJ SOLR
1000.0000 mg | INTRAMUSCULAR | Status: AC
  Administered 2017-11-05 – 2017-11-06 (×2): 1000 mg via INTRAVENOUS
  Filled 2017-11-04 (×2): qty 8

## 2017-11-04 MED ORDER — DEXTROSE 5 % IV SOLN
1.0000 g | INTRAVENOUS | Status: DC
  Administered 2017-11-05: 1 g via INTRAVENOUS
  Filled 2017-11-04 (×2): qty 10

## 2017-11-04 MED ORDER — CEFTRIAXONE SODIUM 1 G IJ SOLR
1.0000 g | Freq: Once | INTRAMUSCULAR | Status: AC
  Administered 2017-11-04: 1 g via INTRAVENOUS
  Filled 2017-11-04: qty 10

## 2017-11-04 MED ORDER — ENOXAPARIN SODIUM 40 MG/0.4ML ~~LOC~~ SOLN
40.0000 mg | SUBCUTANEOUS | Status: DC
  Administered 2017-11-04: 40 mg via SUBCUTANEOUS
  Filled 2017-11-04: qty 0.4

## 2017-11-04 MED ORDER — SODIUM CHLORIDE 0.9 % IV BOLUS (SEPSIS)
1000.0000 mL | Freq: Once | INTRAVENOUS | Status: AC
  Administered 2017-11-04: 1000 mL via INTRAVENOUS

## 2017-11-04 MED ORDER — DEXTROSE 5 % IV SOLN: 1.0000 g | INTRAVENOUS | Status: DC

## 2017-11-04 MED ORDER — LORAZEPAM 2 MG/ML IJ SOLN
1.0000 mg | INTRAMUSCULAR | Status: DC | PRN
  Administered 2017-11-04: 1 mg via INTRAVENOUS
  Filled 2017-11-04: qty 1

## 2017-11-04 MED ORDER — ACETAMINOPHEN 650 MG RE SUPP: 650.0000 mg | Freq: Four times a day (QID) | RECTAL | Status: DC | PRN

## 2017-11-04 MED ORDER — PANTOPRAZOLE SODIUM 40 MG PO TBEC
40.0000 mg | DELAYED_RELEASE_TABLET | Freq: Every day | ORAL | Status: DC
  Administered 2017-11-04 – 2017-11-06 (×3): 40 mg via ORAL
  Filled 2017-11-04 (×3): qty 1

## 2017-11-04 NOTE — H&P (Signed)
History and Physical    Stephen Wade XBJ:[id number] DOB: May 02, 1951 DOA: 11/04/2017  PCP: Wanda PlumpPaz, [name], MD  Patient coming from: Home  Chief Complaint: Increasing weakness, dark urine  HPI: Stephen Wade is a 66 y.o. male with medical history significant of multiple sclerosis on Ocrevus, gastroesophageal reflux presents with increased generalized weakness that started over the past several days prior to hospital admission.  In addition, patient was noted to have a darker than usual urine.  Patient denies any fevers chills or sweats.  Patient denies any dysuria or abdominal discomfort.  Given markedly worsened weakness, patient was brought to the emergency department for further workup  ED Course: In the emergency department, patient was noted to have a urinalysis which was suggestive of urinary tract infection.  Was started on empiric Rocephin.  Allergy was consulted through the emergency department who had recommended inpatient hospital admission with daily high-dose steroids for at least 5 days.  Hospitalist service consulted for consideration for admission.  Review of Systems:  Review of Systems  Constitutional: Positive for malaise/fatigue. Negative for chills, fever and weight loss.  HENT: Negative for ear discharge, ear pain, nosebleeds and sinus pain.   Eyes: Negative for double vision and discharge.  Respiratory: Negative for hemoptysis, sputum production and shortness of breath.   Cardiovascular: Negative for palpitations, orthopnea and claudication.  Gastrointestinal: Negative for diarrhea and vomiting.  Genitourinary: Negative for frequency, hematuria and urgency.  Musculoskeletal: Negative for back pain, joint pain and neck pain.  Neurological: Positive for weakness. Negative for tingling, tremors, focal weakness, seizures and loss of consciousness.  Psychiatric/Behavioral: Negative for substance abuse. The patient is not nervous/anxious and does not have insomnia.     Past  Medical History:  Diagnosis Date  . GERD (gastroesophageal reflux disease)    had on- off chest pain, symptoms resolved with Prilosec 11/2010  . Lower urinary tract symptoms (LUTS)    Dr Patsi Searsannenbaum  . MS (multiple sclerosis) (HCC) 2005  . Testicular hypogonadism   . Urge incontinence of urine   . Urolithiasis 2011    Past Surgical History:  Procedure Laterality Date  . APPENDECTOMY      ruptured 03-2010  . VASECTOMY       reports that  has never smoked. he has never used smokeless tobacco. He reports that he does not drink alcohol or use drugs.  No Known Allergies  Family History  Problem Relation Age of Onset  . Heart attack Mother   . Brain cancer Father     Prior to Admission medications   Medication Sig Start Date End Date Taking? Authorizing Provider  baclofen (LIORESAL) 10 MG tablet TAKE 1 TABLET BY MOUTH 3 TIMES A DAY Patient taking differently: takes 10 mg in the morning and 30 mg in the evening 10/07/17  Yes Sater, Pearletha Furlichard A, MD  ENSURE (ENSURE) Take 237 mLs daily by mouth.   Yes [provider]  ocrelizumab (OCREVUS) 300 MG/10ML injection Inject 300 mg every 6 (six) months into the vein.   Yes [provider]  omeprazole (PRILOSEC) 20 MG capsule Take 20 mg daily as needed by mouth (for acid reflex).    Yes [provider]  tamsulosin (FLOMAX) 0.4 MG CAPS capsule Take 0.8 mg by mouth at bedtime. 02/26/16  Yes [provider]  TECFIDERA 240 MG CPDR TAKE 1 CAPSULE (240 MG TOTAL) BY MOUTH 2 (TWO) TIMES DAILY. Patient not taking: Reported on 11/04/2017 05/06/17   Asa LenteSater, Richard A, MD  Physical Exam: Vitals:   11/04/17 1138  BP: (!) 160/67  Resp: 18  Temp: 97.8 F (36.6 C)  TempSrc: Oral  SpO2: 98%    Constitutional: NAD, calm, comfortable Vitals:   11/04/17 1138  BP: (!) 160/67  Resp: 18  Temp: 97.8 F (36.6 C)  TempSrc: Oral  SpO2: 98%   Eyes: PERRL, lids and conjunctivae normal ENMT: Mucous membranes are moist.  Posterior pharynx clear of any exudate or lesions.Normal dentition.  Neck: normal, supple, no masses, no thyromegaly Respiratory: clear to auscultation bilaterally, no wheezing, no crackles. Normal respiratory effort. No accessory muscle use.  Cardiovascular: Regular rate and rhythm, no murmurs / rubs / gallops. No extremity edema. 2+ pedal pulses. No carotid bruits.  Abdomen: no tenderness, no masses palpated. No hepatosplenomegaly. Bowel sounds positive.  Musculoskeletal: no clubbing / cyanosis. No joint deformity upper and lower extremities. Good ROM, no contractures. Normal muscle tone.  Skin: no rashes, lesions, ulcers. No induration Neurologic: CN 2-12 grossly intact. Strength 4/5 in upper extremities, 2 out of 5 strength in lower extremities Psychiatric: Normal judgment and insight. Alert and oriented x 3. Normal mood.    Labs on Admission: I have personally reviewed following labs and imaging studies  CBC: Recent Labs  Lab 11/04/17 1330  WBC 7.9  NEUTROABS 5.8  HGB 14.9  HCT 43.1  MCV 87.4  PLT 182   Basic Metabolic Panel: Recent Labs  Lab 11/04/17 1220  NA 140  K 4.2  CL 104  CO2 27  GLUCOSE 101*  BUN 18  CREATININE 0.78  CALCIUM 9.6   GFR: CrCl cannot be calculated (Unknown ideal weight.). Liver Function Tests: Recent Labs  Lab 11/04/17 1220  AST 30  ALT 34  ALKPHOS 99  BILITOT 1.3*  PROT 8.0  ALBUMIN 4.4   No results for input(s): LIPASE, AMYLASE in the last 168 hours. No results for input(s): AMMONIA in the last 168 hours. Coagulation Profile: No results for input(s): INR, PROTIME in the last 168 hours. Cardiac Enzymes: No results for input(s): CKTOTAL, CKMB, CKMBINDEX, TROPONINI in the last 168 hours. BNP (last 3 results) No results for input(s): PROBNP in the last 8760 hours. HbA1C: No results for input(s): HGBA1C in the last 72 hours. CBG: No results for input(s): GLUCAP in the last 168 hours. Lipid Profile: No results for input(s): CHOL,  HDL, LDLCALC, TRIG, CHOLHDL, LDLDIRECT in the last 72 hours. Thyroid Function Tests: No results for input(s): TSH, T4TOTAL, FREET4, T3FREE, THYROIDAB in the last 72 hours. Anemia Panel: No results for input(s): VITAMINB12, FOLATE, FERRITIN, TIBC, IRON, RETICCTPCT in the last 72 hours. Urine analysis:    Component Value Date/Time   COLORURINE YELLOW 11/04/2017 1340   APPEARANCEUR HAZY (A) 11/04/2017 1340   LABSPEC 1.019 11/04/2017 1340   PHURINE 6.0 11/04/2017 1340   GLUCOSEU NEGATIVE 11/04/2017 1340   GLUCOSEU NEGATIVE 03/11/2013 1106   HGBUR NEGATIVE 11/04/2017 1340   HGBUR large 10/05/2010 1242   BILIRUBINUR NEGATIVE 11/04/2017 1340   BILIRUBINUR neg 03/11/2013 1039   KETONESUR 20 (A) 11/04/2017 1340   PROTEINUR NEGATIVE 11/04/2017 1340   UROBILINOGEN 0.2 03/17/2013 1005   NITRITE POSITIVE (A) 11/04/2017 1340   LEUKOCYTESUR MODERATE (A) 11/04/2017 1340   Sepsis Labs: !!!!!!!!!!!!!!!!!!!!!!!!!!!!!!!!!!!!!!!!!!!! @LABRCNTIP (procalcitonin:4,lacticidven:4) )No results found for this or any previous visit (from the past 240 hour(s)).   Radiological Exams on Admission: No results found.  MRI brain from 2010 reviewed. Findings of worsening MS on that study  Assessment/Plan Principal Problem:   MULTIPLE SCLEROSIS Active  Problems:   Weakness   Acute lower UTI   1. MS exacerbation 1. Symptoms concerning for MS exacerbation 2. Neurology consulted through ED with recommendations for 5 days of high dose steroids 3. 1gm solumedrol given in ED 4. Pt on Ocrevus for MS prior to admit. Will hold off and defer further mgt of MS to Neuorlogy 5. Will admit to med-surg 2. Weakenss 1. Likely seconary to above 2. Patient baseline does not ambulate and uses a powered wheelchair. 3. UTI 1. Urine cx pending 2. No leukocytosis.  Patient afebrile 3. On empiric rocephin  DVT prophylaxis: Lovenox subQ  Code Status: full Family Communication: Pt in room  Disposition Plan: Uncertain at this  time  Consults called: neurology (Dr. Amada Jupiter) Admission status: Inpatient as will require 5 days of IV steroids to treat MS exacerbation   Levent Kornegay, Scheryl Marten MD [facility] Pager [phone]  If 7PM-7AM, please contact night-coverage [email or web address] Password [facility]  11/04/2017, 2:34 PM

## 2017-11-04 NOTE — ED Provider Notes (Signed)
[facility]-EMERGENCY DEPT Provider Note   CSN: [id number] Arrival date & time: 11/04/17  1125     History   Chief Complaint Chief Complaint  Patient presents with  . Weakness    HPI Harden MoJames C Beza is a 66 y.o. male history of relapsing and remitting multiple sclerosis on Ocrevus, reflux here presenting with worsening weakness.  Patient states that at baseline, he does not walk and uses a power wheelchair.  He also self caths he has chronic retention from multiple sclerosis.  Patient noticed that for the last month or so he has been progressively weaker and for the last several days, patient was unable to transfer himself or lift up his legs.  Patient denies any numbness or trouble breathing.  Patient states that his urine is darker than usual but denies any dysuria.  [facility]   The history is provided by the patient.    Past Medical History:  Diagnosis Date  . GERD (gastroesophageal reflux disease)    had on- off chest pain, symptoms resolved with Prilosec 11/2010  . Lower urinary tract symptoms (LUTS)    Dr Patsi Searsannenbaum  . MS (multiple sclerosis) (HCC) 2005  . Testicular hypogonadism   . Urge incontinence of urine   . Urolithiasis 2011    Patient Active Problem List   Diagnosis Date Noted  . Skin ulcer of sacral region (HCC) 05/26/2017  . Vitamin D deficiency 01/16/2016  . Spastic gait 02/10/2015  . Spastic diplegia (HCC) 02/10/2015  . Other fatigue 02/10/2015  . Disturbed cognition 02/10/2015  . Edema 05/12/2014  . Pressure ulcer of coccygeal region, stage 1 08/24/2013  . Hypogonadism male 03/11/2013  . Lower urinary tract symptoms (LUTS) 12/26/2011  . GERD 11/30/2010  . URINARY CALCULUS 10/05/2010  . CHEST PAIN 10/05/2010  . MULTIPLE SCLEROSIS 07/24/2007    Past Surgical History:  Procedure Laterality Date  . APPENDECTOMY      ruptured 03-2010  . VASECTOMY         Home Medications    Prior  to Admission medications   Medication Sig Start Date End Date Taking? Authorizing Provider  baclofen (LIORESAL) 10 MG tablet TAKE 1 TABLET BY MOUTH 3 TIMES A DAY Patient taking differently: takes 10 mg in the morning and 30 mg in the evening 10/07/17  Yes Sater, Pearletha Furlichard A, MD  ENSURE (ENSURE) Take 237 mLs daily by mouth.   Yes [provider]  ocrelizumab (OCREVUS) 300 MG/10ML injection Inject 300 mg every 6 (six) months into the vein.   Yes [provider]  omeprazole (PRILOSEC) 20 MG capsule Take 20 mg daily as needed by mouth (for acid reflex).    Yes [provider]  tamsulosin (FLOMAX) 0.4 MG CAPS capsule Take 0.8 mg by mouth at bedtime. 02/26/16  Yes [provider]  TECFIDERA 240 MG CPDR TAKE 1 CAPSULE (240 MG TOTAL) BY MOUTH 2 (TWO) TIMES DAILY. Patient not taking: Reported on 11/04/2017 05/06/17   Asa LenteSater, Richard A, MD    Family History Family History  Problem Relation Age of Onset  . Heart attack Mother   . Brain cancer Father     Social History Social History   Tobacco Use  . Smoking status: Never Smoker  . Smokeless tobacco: Never Used  Substance Use Topics  . Alcohol use: No    Alcohol/week: 0.0 oz    Comment: rare  . Drug use: No     Allergies  Patient has no known allergies.   Review of Systems Review of Systems  Neurological: Positive for weakness.  All other systems reviewed and are negative.    Physical Exam Updated Vital Signs BP (!) 160/67 (BP Location: Left Arm)   Temp 97.8 F (36.6 C) (Oral)   Resp 18   SpO2 98%   Physical Exam  Constitutional: He is oriented to person, place, and time.  Chronically ill   HENT:  Head: Normocephalic.  MM slightly dry   Eyes: Conjunctivae and EOM are normal. Pupils are equal, round, and reactive to light.  Neck: Normal range of motion. Neck supple.  Cardiovascular: Regular rhythm.  Tachycardic   Pulmonary/Chest: Effort normal and breath sounds normal. No stridor. No  respiratory distress. He has no wheezes.  Abdominal: Soft. Bowel sounds are normal. He exhibits no distension. There is no tenderness. There is no guarding.  Musculoskeletal: Normal range of motion.  Neurological: He is alert and oriented to person, place, and time.  Strength 3/5 bilateral lower extremities, 5/5 upper extremities. 1+ reflexes bilateral knees   Skin: Skin is warm.  Psychiatric: He has a normal mood and affect.  Nursing note and vitals reviewed.    ED Treatments / Results  Labs (all labs ordered are listed, but only abnormal results are displayed) Labs Reviewed  COMPREHENSIVE METABOLIC PANEL - Abnormal; Notable for the following components:      Result Value   Glucose, Bld 101 (*)    Total Bilirubin 1.3 (*)    All other components within normal limits  URINALYSIS, ROUTINE W REFLEX MICROSCOPIC - Abnormal; Notable for the following components:   APPearance HAZY (*)    Ketones, ur 20 (*)    Nitrite POSITIVE (*)    Leukocytes, UA MODERATE (*)    Bacteria, UA RARE (*)    Squamous Epithelial / LPF 0-5 (*)    All other components within normal limits  CBC WITH DIFFERENTIAL/PLATELET - Abnormal; Notable for the following components:   Monocytes Absolute 1.4 (*)    All other components within normal limits  URINE CULTURE  CBC WITH DIFFERENTIAL/PLATELET    EKG  EKG Interpretation None       Radiology No results found.  Procedures Procedures (including critical care time)  Medications Ordered in ED Medications  cefTRIAXone (ROCEPHIN) 1 g in dextrose 5 % 50 mL IVPB (not administered)  sodium chloride 0.9 % bolus 1,000 mL (1,000 mLs Intravenous New Bag/Given 11/04/17 1226)  methylPREDNISolone sodium succinate (SOLU-MEDROL) 1,000 mg in sodium chloride 0.9 % 50 mL IVPB (0 mg Intravenous Stopped 11/04/17 1358)     Initial Impression / Assessment and Plan / ED Course  I have reviewed the triage vital signs and the nursing notes.  Pertinent labs & imaging  results that were available during my care of the patient were reviewed by me and considered in my medical decision making (see chart for details).    LORD LANCOUR is a 66 y.o. male here with weakness. Hx of MS and likely MS flare. He is unable to transfer himself, which is new. Will get labs, UA. I consulted Dr. Amada Jupiter from neurology, who will see patient.    2:28 PM Dr. Amada Jupiter saw patient and recommend 1 G solumedrol x 5 days and keep at [facility]. He will order MRI. UA + UTI, given rocephin and urine culture sent. Hospitalist to admit for MS exacerbation, UTI.   Final Clinical Impressions(s) / ED Diagnoses   Final diagnoses:  None  ED Discharge Orders    None       Charlynne Pander, MD 11/04/17 [phone]

## 2017-11-04 NOTE — Consult Note (Signed)
Neurology Consultation Reason for Consult: Inability to walk Referring Physician: Silverio Lay, d  CC: Inability to walk  History is obtained from: Patient  HPI: Stephen Wade is a 66 y.o. male with a history of multiple sclerosis who was started on ocrevus in August who presents with worsening gait dysfunction.  He states that over the past couple of days, it has been getting progressively difficult to walk or stand up and today he was unable to get out of his chair at all.  This represents a pretty marked difference from typical.  He denies numbness.  He self caths at baseline.   ROS: A 14 point ROS was performed and is negative except as noted in the HPI.   Past Medical History:  Diagnosis Date  . GERD (gastroesophageal reflux disease)    had on- off chest pain, symptoms resolved with Prilosec 11/2010  . Lower urinary tract symptoms (LUTS)    Dr Patsi Sears  . MS (multiple sclerosis) (HCC) 2005  . Testicular hypogonadism   . Urge incontinence of urine   . Urolithiasis 2011     Family History  Problem Relation Age of Onset  . Heart attack Mother   . Brain cancer Father      Social History:  reports that  has never smoked. he has never used smokeless tobacco. He reports that he does not drink alcohol or use drugs.   Exam: Current vital signs: BP (!) 143/84 (BP Location: Right Arm)   Pulse (!) 105   Temp 97.8 F (36.6 C) (Oral)   Resp 18   SpO2 98%  Vital signs in last 24 hours: Temp:  [97.8 F (36.6 C)] 97.8 F (36.6 C) (11/13 1138) Pulse Rate:  [105] 105 (11/13 1436) Resp:  [18] 18 (11/13 1436) BP: (143-160)/(67-84) 143/84 (11/13 1436) SpO2:  [98 %] 98 % (11/13 1436)   Physical Exam  Constitutional: Appears well-developed and well-nourished.  Psych: Affect appropriate to situation Eyes: No scleral injection HENT: No OP obstrucion Head: Normocephalic.  Cardiovascular: Normal rate and regular rhythm.  Respiratory: Effort normal and breath sounds normal to anterior  ascultation GI: Soft.  No distension. There is no tenderness.  Skin: WDI  Neuro: Mental Status: Patient is awake, alert, oriented to person, place, month, year, and situation. Patient is able to give a clear and coherent history. No signs of aphasia or neglect He has mild dysarthria Cranial Nerves: II: Visual Fields are full. Pupils are equal, round, and reactive to light.   III,IV, VI: EOMI without ptosis or diploplia.  V: Facial sensation is symmetric to temperature VII: Facial movement with very mild right facial weakness VIII: hearing is intact to voice X: Uvula elevates symmetrically XI: Shoulder shrug is symmetric. XII: tongue is midline without atrophy or fasciculations.  Motor: Tone is normal. Bulk is normal.  He has 4/5 right arm strength, and the right leg he is able to flicker his right ankle with plantar flexion, 2/5 hip abduction/adduction, otherwise 0/5, in the left leg he has 3/5 strength. Sensory: Sensation is symmetric to light touch and temperature in the arms and legs. Deep Tendon Reflexes: 3+ in the left knee and ankle, sustained clonus in the right ankle Cerebellar: He has ataxia on finger-nose-finger bilaterally  I have reviewed labs in epic and the results pertinent to this consultation are: CMP-unremarkable  Impression: 66 year old male with worsening gait dysfunction in the setting of multiple sclerosis.  I suspect that this represents multiple sclerosis flare.  He does have a UTI,  and it is possible that this is recrudescence of previous symptoms, and therefore would recommend performing imaging to assess.  He has symptoms referrable to multiple places in the neuraxis, and therefore I think we would need imaging of brain C-spine and T-spine to rule out active inflammation.  Recommendations: 1) MRI brain, C-spine, T-spine 2) he has already received a dose of Solu-Medrol, will likely continue this. 3) agree with treatment of UTI   Ritta SlotMcNeill Nai Dasch,  MD [facility] [phone]  If 7pm- 7am, please page neurology on call as listed in AMION.

## 2017-11-04 NOTE — ED Triage Notes (Signed)
Per EMS, pt w/ hx of MS from home complains of difficulty ambulating today. Pt has hx of UTI, pt self caths and reports dark urine.   CBG 105

## 2017-11-04 NOTE — Telephone Encounter (Signed)
Spoke with Stephen Wade--she sts. Stephen FearingJames has extreme generalized weakness, worse since yesterday. Unable to get out of bed, and she doesn't think she can get him up. Urine is dark, o/w no s/s of infection. Per RAS, he should go to the ER--they can r/o infection vs MS exacerbation, treat, then get him into rehab.  Stephen AuLeslie verbalized understanding of same/fim

## 2017-11-04 NOTE — ED Notes (Signed)
Bed: WA04 Expected date:  Expected time:  Means of arrival:  Comments: 

## 2017-11-05 DIAGNOSIS — N3000 Acute cystitis without hematuria: Secondary | ICD-10-CM

## 2017-11-05 LAB — CBC
HCT: 41.3 % (ref 39.0–52.0)
Hemoglobin: 14.2 g/dL (ref 13.0–17.0)
MCH: 29.5 pg (ref 26.0–34.0)
MCHC: 34.4 g/dL (ref 30.0–36.0)
MCV: 85.7 fL (ref 78.0–100.0)
Platelets: 213 10*3/uL (ref 150–400)
RBC: 4.82 MIL/uL (ref 4.22–5.81)
RDW: 13.8 % (ref 11.5–15.5)
WBC: 11.3 10*3/uL — ABNORMAL HIGH (ref 4.0–10.5)

## 2017-11-05 LAB — COMPREHENSIVE METABOLIC PANEL
ALT: 28 U/L (ref 17–63)
ANION GAP: 8 (ref 5–15)
AST: 26 U/L (ref 15–41)
Albumin: 3.6 g/dL (ref 3.5–5.0)
Alkaline Phosphatase: 86 U/L (ref 38–126)
BUN: 21 mg/dL — ABNORMAL HIGH (ref 6–20)
CHLORIDE: 109 mmol/L (ref 101–111)
CO2: 23 mmol/L (ref 22–32)
CREATININE: 0.85 mg/dL (ref 0.61–1.24)
Calcium: 9.2 mg/dL (ref 8.9–10.3)
Glucose, Bld: 173 mg/dL — ABNORMAL HIGH (ref 65–99)
POTASSIUM: 3.9 mmol/L (ref 3.5–5.1)
Sodium: 140 mmol/L (ref 135–145)
Total Bilirubin: 0.9 mg/dL (ref 0.3–1.2)
Total Protein: 7.3 g/dL (ref 6.5–8.1)

## 2017-11-05 LAB — HIV ANTIBODY (ROUTINE TESTING W REFLEX): HIV Screen 4th Generation wRfx: NONREACTIVE

## 2017-11-05 NOTE — Progress Notes (Signed)
Subjective: Patient still has significant lower extremity weakness however states that his right arm feels stronger and now able to extend his right arm.  Exam: Vitals:   11/04/17 2022 11/05/17 0510  BP: 132/69 117/69  Pulse: (!) 101 (!) 110  Resp: 18 20  Temp: 98.4 F (36.9 C) 98.4 F (36.9 C)  SpO2: 100% 96%    HEENT-  Normocephalic, no lesions, without obvious abnormality.  Normal external eye and conjunctiva.  Normal TM's bilaterally.  Normal auditory canals and external ears. Normal external nose, mucus membranes and septum.  Normal pharynx.    Neuro:  CN: Pupils are equal and round. They are symmetrically reactive from 3-->2 mm. EOMI without nystagmus. Facial sensation is intact to light touch. Face is symmetric at rest with normal strength and mobility. Hearing is intact to conversational voice. Palate elevates symmetrically and uvula is midline. Voice is normal in tone, pitch and quality. Bilateral SCM and trapezii are 5/5. Tongue is midline with normal bulk and mobility.  Motor:   Tone is normal. Bulk is normal.  He has 4+/5 right arm strength, and the right leg he is able to flicker his right ankle with plantar flexion, 2/5 hip abduction/adduction, otherwise 0/5, in the left leg he has 3/5 strength.  Sensation: Intact to light touch.  DTRs: 3+ in the left knee and ankle, sustained clonus in the right ankle    Medications:  Scheduled: . enoxaparin (LOVENOX) injection  40 mg Subcutaneous Q24H  . pantoprazole  40 mg Oral Daily  . tamsulosin  0.8 mg Oral QHS    Pertinent Labs/Diagnostics:   IMPRESSION: MRI HEAD IMPRESSION: 1. Cerebral white matter changes consistent with chronic demyelinating disease. No evidence for active demyelination identified. 2. Suspected superimposed component of chronic micro vessel ischemic changes with remote right basal ganglia lacunar infarct. MRI CERVICAL SPINE IMPRESSION: 1. Subtle patchy cord signal abnormality within the cervical spinal  cord, consistent with underlying demyelinating disease. No evidence for active demyelination. 2. Right eccentric disc osteophyte complex at C6-7 with resultant mild to moderate canal and severe right C6 foraminal stenosis. 3. Broad central disc protrusion at C5-6 with mild canal and bilateral C6 foraminal stenosis. MRI THORACIC SPINE IMPRESSION: 1. Subtle patchy cord signal abnormality within the thoracic spinal cord, consistent with underlying demyelinating disease. No evidence for active demyelination. 2. Small disc protrusions at T6-7, T7-8, and T9-10 without stenosis or cord deformity. Electronically Signed   By: Rise Mu M.D.   On: 11/05/2017 01:47     Felicie Morn PA-C [facility] [phone]   Impression: 66 year old male with worsening gait dysfunction in the setting of multiple sclerosis.  MRI does not show any lesions. He does have a UTI, and it is likely that this is recrudescence of previous symptoms.    Recommendations: 1) continue solumedrol for total three doses 2) Continue treat UTI 3) PT/OT    Ritta Slot, MD [facility] [phone]  If 7pm- 7am, please page neurology on call as listed in AMION. 11/05/2017, 9:09 AM

## 2017-11-05 NOTE — Progress Notes (Signed)
PROGRESS NOTE    Stephen Wade  ZOX:[id number] DOB: 05/25/1951 DOA: 11/04/2017 PCP: Wanda Plump, MD    Brief Narrative:  Stephen Wade is a 66 y.o. male with medical history significant of multiple sclerosis on Ocrevus, gastroesophageal reflux presents with increased generalized weakness that started over the past several days prior to hospital admission.  In addition, patient was noted to have a darker than usual urine.  Patient denies any fevers chills or sweats.  Patient denies any dysuria or abdominal discomfort.  Given markedly worsened weakness, patient was brought to the emergency department for further workup  ED Course: In the emergency department, patient was noted to have a urinalysis which was suggestive of urinary tract infection.  Was started on empiric Rocephin.  Allergy was consulted through the emergency department who had recommended inpatient hospital admission with daily high-dose steroids for at least 5 days.  Hospitalist service consulted for consideration for admission.     Assessment & Plan:   Principal Problem:   MULTIPLE SCLEROSIS Active Problems:   Weakness   Acute lower UTI   Multiple sclerosis exacerbation (HCC)   MS exacerbation - Symptoms concerning for MS exacerbation - Neurology consulted -Per neurology note today patient will receive 3 doses of IV steroids - 1gm solumedrol given in ED - Pt on Ocrevus for MS prior to admit.  Weakenss - Likely secondary to above - Patient baseline does not ambulate and uses a powered wheelchair -Patient refused PT OT assessment.  UTI - Urine cx showing PROVIDENCIA STUARTII -Await sensitivities - No leukocytosis.  Patient afebrile - On empiric rocepin      DVT prophylaxis: Lovenox Code Status: Full code Family Communication: No family bedside Disposition Plan: We will likely discharge after last dose of steroids in the morning pending culture sensitivities are back   Consultants:    Neurology  Procedures:   None  Antimicrobials:   Rocephin   Subjective: Patient seen during bedside rounds.  He voices he is excited that the MRI did not show any new active lesions of his multiple sclerosis.  He denies wanting to be seen by physical therapy.  Denies any new symptoms such as dysuria or hematuria.  No overnight events noted  Objective: Vitals:   11/05/17 0017 11/05/17 0510 11/05/17 0920 11/05/17 1450  BP:  117/69 124/72 121/76  Pulse:  (!) 110 100 (!) 102  Resp:  20 20 20   Temp:  98.4 F (36.9 C) 97.6 F (36.4 C) (!) 97.4 F (36.3 C)  TempSrc:  Oral Oral Oral  SpO2:  96% 94% 96%  Weight: 88.3 kg (194 lb 10.7 oz)     Height:        Intake/Output Summary (Last 24 hours) at 11/05/2017 1531 Last data filed at 11/05/2017 0920 Gross per 24 hour  Intake 440 ml  Output 2300 ml  Net -1860 ml   Filed Weights   11/04/17 1619 11/05/17 0017  Weight: 89.4 kg (197 lb 1.5 oz) 88.3 kg (194 lb 10.7 oz)    Examination:  General exam: Appears calm and comfortable  Respiratory system: Clear to auscultation. Respiratory effort normal. Cardiovascular system: S1 & S2 heard, RRR. No JVD, murmurs, rubs, gallops or clicks. No pedal edema. Gastrointestinal system: Abdomen is nondistended, soft and nontender. No organomegaly or masses felt. Normal bowel sounds heard. Central nervous system: Alert and oriented. No focal neurological deficits. Extremities: able to move arms and legs independently and spontaneously. Per patient improved strength in the right arm (4/5  strength in upper extremities) and 2/5 strength in the lower extremities bilaterally Skin: No rashes, lesions or ulcers Psychiatry: Judgement and insight appear normal. Mood & affect appropriate.     Data Reviewed: I have personally reviewed following labs and imaging studies  CBC: Recent Labs  Lab 11/04/17 1330 11/05/17 0631  WBC 7.9 11.3*  NEUTROABS 5.8  --   HGB 14.9 14.2  HCT 43.1 41.3  MCV 87.4  85.7  PLT 182 213   Basic Metabolic Panel: Recent Labs  Lab 11/04/17 1220 11/05/17 0631  NA 140 140  K 4.2 3.9  CL 104 109  CO2 27 23  GLUCOSE 101* 173*  BUN 18 21*  CREATININE 0.78 0.85  CALCIUM 9.6 9.2   GFR: Estimated Creatinine Clearance: 96.4 mL/min (by C-G formula based on SCr of 0.85 mg/dL). Liver Function Tests: Recent Labs  Lab 11/04/17 1220 11/05/17 0631  AST 30 26  ALT 34 28  ALKPHOS 99 86  BILITOT 1.3* 0.9  PROT 8.0 7.3  ALBUMIN 4.4 3.6   No results for input(s): LIPASE, AMYLASE in the last 168 hours. No results for input(s): AMMONIA in the last 168 hours. Coagulation Profile: No results for input(s): INR, PROTIME in the last 168 hours. Cardiac Enzymes: No results for input(s): CKTOTAL, CKMB, CKMBINDEX, TROPONINI in the last 168 hours. BNP (last 3 results) No results for input(s): PROBNP in the last 8760 hours. HbA1C: No results for input(s): HGBA1C in the last 72 hours. CBG: No results for input(s): GLUCAP in the last 168 hours. Lipid Profile: No results for input(s): CHOL, HDL, LDLCALC, TRIG, CHOLHDL, LDLDIRECT in the last 72 hours. Thyroid Function Tests: No results for input(s): TSH, T4TOTAL, FREET4, T3FREE, THYROIDAB in the last 72 hours. Anemia Panel: No results for input(s): VITAMINB12, FOLATE, FERRITIN, TIBC, IRON, RETICCTPCT in the last 72 hours. Sepsis Labs: No results for input(s): PROCALCITON, LATICACIDVEN in the last 168 hours.  Recent Results (from the past 240 hour(s))  Urine culture     Status: Abnormal (Preliminary result)   Collection Time: 11/04/17  1:40 PM  Result Value Ref Range Status   Specimen Description URINE, CATHETERIZED  Final   Special Requests NONE  Final   Culture (A)  Final    >=100,000 COLONIES/mL PROVIDENCIA STUARTII SUSCEPTIBILITIES TO FOLLOW Performed at [facility], [address], [place], [place] [address]    Report Status PENDING  Incomplete         Radiology Studies: [name] XB  Contrast  Result Date: 11/05/2017 CLINICAL DATA:  66 year old male with history of multiple sclerosis, with worsened gait dysfunction. EXAM: MRI HEAD WITHOUT AND WITH CONTRAST MRI CERVICAL SPINE WITHOUT AND WITH CONTRAST MRI THORACIC SPINE WITHOUT AND WITH CONTRAST TECHNIQUE: Multiplanar, multiecho pulse sequences of the brain and surrounding structures, and cervical spine, to include the craniocervical junction and cervicothoracic junction, were obtained without and with intravenous contrast. CONTRAST:  19mL MULTIHANCE GADOBENATE DIMEGLUMINE 529 MG/ML IV SOLN COMPARISON:  None available. FINDINGS: MRI HEAD FINDINGS Brain: Study degraded by motion artifact. Generalized age-related cerebral atrophy. Multifocal patchy T2/FLAIR hyperintensities seen involving the periventricular, deep, and subcortical white matter both cerebral hemispheres. Many of these foci oriented perpendicular to the lateral ventricles in a distribution suggestive of underlying demyelinating disease/ multiple sclerosis. Scattered T1 hypointense lesions compatible with chronic demyelinating plaques. A superimposed component of mild chronic small vessel ischemia may be present as well. Suspected superimposed remote lacunar infarct present within the right basal ganglia. No significant involvement of the  brainstem or posterior fossa. No abnormal restricted diffusion or enhancement to suggest active demyelination. No evidence for acute or subacute infarct. Gray-white matter differentiation maintained. No evidence for acute intracranial hemorrhage. Probable trace chronic hemorrhage associated with the right basal ganglia lacunar infarct noted. No mass lesion, midline shift, or mass effect. No hydrocephalus. No extra-axial fluid collection. Major dural sinuses are patent. Pituitary gland and suprasellar region within normal limits. Midline structures intact and normal. Vascular: Major intracranial vascular flow voids are maintained. Skull and upper  cervical spine: Craniocervical junction within normal limits. Bone marrow signal intensity heterogeneous. No scalp soft tissue abnormality. Sinuses/Orbits: Globes and orbital soft tissues within normal limits. Paranasal sinuses are largely clear. No mastoid effusion. Inner ear structures normal. Other: None. MRI CERVICAL SPINE FINDINGS Alignment: Study degraded by motion artifact. Vertebral bodies normally aligned with preservation of the normal cervical lordosis. No listhesis. Vertebrae: Vertebral body heights maintained. No evidence for acute or chronic fracture. Bone marrow signal intensity mildly heterogeneous but within normal limits. Few scattered benign hemangiomas noted, most notable within the T1 and T2 vertebral bodies. No worrisome osseous lesions. No abnormal marrow edema. Cord: Patchy T2 cord signal seen within the cervical spinal cord suspicious for demyelinating disease. Most IV its foci are seen within the bilateral cord at the level of C5-6 (series 5, image 29) as well as the right aspect of the cord at C6 (series 5, image 33). No convincing foci of enhancement seen to suggest active demyelination. Overall cord caliber within normal limits. Posterior Fossa, vertebral arteries, paraspinal tissues: Paraspinous and prevertebral soft tissues within normal limits. Normal intravascular flow voids present within the vertebral arteries bilaterally. Disc levels: C2-C3: Unremarkable. C3-C4:  Minimal disc bulge.  No stenosis. C4-C5: Shallow right eccentric disc bulge. Flattening of the right ventral thecal sac without significant spinal stenosis. Mild right C5 foraminal narrowing. C5-C6: Broad posterior disc protrusion flattens and partially faces the ventral thecal sac. Mild spinal stenosis. No significant cord deformity. Mild bilateral C6 foraminal stenosis. C6-C7: Broad right eccentric disc protrusion extending laterally into the right neural foramen. Superimposed right-sided uncovertebral spurring.  Resultant mild to moderate spinal stenosis with mild flattening of the right hemi cord. Severe right with moderate left C7 foraminal stenosis. C7-T1:  Unremarkable. MRI THORACIC SPINE FINDINGS Alignment: Normal alignment with preservation of the normal thoracic kyphosis. No listhesis. Vertebrae: Vertebral body heights well maintained. No evidence for acute or chronic fracture. Bone marrow signal intensity mildly heterogeneous but within normal limits. Few scattered benign hemangiomas noted, most notable within the T1 and T2 vertebral bodies. No worrisome osseous lesions. Cord: Subtle patchy T2 signal abnormality seen within the thoracic spinal cord, suspicious for demyelinating disease. Most obvious of these foci seen within the right cord at the level of T8-9 (series 11, image 23). No abnormal enhancement to suggest active demyelination. Cord caliber within normal limits. Paraspinous soft tissues: Paraspinous soft tissues within normal limits. Mild atelectatic changes noted within the visualized lungs. Few scattered T2 hyperintense cyst noted within the visualized kidneys. Visualized visceral structures otherwise unremarkable. Disc levels: T6-7: Tiny right paracentral disc protrusion minimally indents the right ventral thecal sac (series 11, image 17). No significant stenosis or cord deformity. T7-8: Shallow right paracentral disc protrusion minimally flattens the ventral thecal sac without stenosis or cord deformity (series 11, image 20). T9-10: Tiny central disc protrusion indents the ventral thecal sac without stenosis or cord deformity (series 11, image 29). No other significant degenerative changes within the thoracic spine. No canal or foraminal  stenosis. IMPRESSION: MRI HEAD IMPRESSION: 1. Cerebral white matter changes consistent with chronic demyelinating disease. No evidence for active demyelination identified. 2. Suspected superimposed component of chronic micro vessel ischemic changes with remote right  basal ganglia lacunar infarct. MRI CERVICAL SPINE IMPRESSION: 1. Subtle patchy cord signal abnormality within the cervical spinal cord, consistent with underlying demyelinating disease. No evidence for active demyelination. 2. Right eccentric disc osteophyte complex at C6-7 with resultant mild to moderate canal and severe right C6 foraminal stenosis. 3. Broad central disc protrusion at C5-6 with mild canal and bilateral C6 foraminal stenosis. MRI THORACIC SPINE IMPRESSION: 1. Subtle patchy cord signal abnormality within the thoracic spinal cord, consistent with underlying demyelinating disease. No evidence for active demyelination. 2. Small disc protrusions at T6-7, T7-8, and T9-10 without stenosis or cord deformity. Electronically Signed   By: Rise Mu M.D.   On: 11/05/2017 01:47   Mr Cervical Spine W Wo Contrast  Result Date: 11/05/2017 CLINICAL DATA:  66 year old male with history of multiple sclerosis, with worsened gait dysfunction. EXAM: MRI HEAD WITHOUT AND WITH CONTRAST MRI CERVICAL SPINE WITHOUT AND WITH CONTRAST MRI THORACIC SPINE WITHOUT AND WITH CONTRAST TECHNIQUE: Multiplanar, multiecho pulse sequences of the brain and surrounding structures, and cervical spine, to include the craniocervical junction and cervicothoracic junction, were obtained without and with intravenous contrast. CONTRAST:  19mL MULTIHANCE GADOBENATE DIMEGLUMINE 529 MG/ML IV SOLN COMPARISON:  None available. FINDINGS: MRI HEAD FINDINGS Brain: Study degraded by motion artifact. Generalized age-related cerebral atrophy. Multifocal patchy T2/FLAIR hyperintensities seen involving the periventricular, deep, and subcortical white matter both cerebral hemispheres. Many of these foci oriented perpendicular to the lateral ventricles in a distribution suggestive of underlying demyelinating disease/ multiple sclerosis. Scattered T1 hypointense lesions compatible with chronic demyelinating plaques. A superimposed component of  mild chronic small vessel ischemia may be present as well. Suspected superimposed remote lacunar infarct present within the right basal ganglia. No significant involvement of the brainstem or posterior fossa. No abnormal restricted diffusion or enhancement to suggest active demyelination. No evidence for acute or subacute infarct. Gray-white matter differentiation maintained. No evidence for acute intracranial hemorrhage. Probable trace chronic hemorrhage associated with the right basal ganglia lacunar infarct noted. No mass lesion, midline shift, or mass effect. No hydrocephalus. No extra-axial fluid collection. Major dural sinuses are patent. Pituitary gland and suprasellar region within normal limits. Midline structures intact and normal. Vascular: Major intracranial vascular flow voids are maintained. Skull and upper cervical spine: Craniocervical junction within normal limits. Bone marrow signal intensity heterogeneous. No scalp soft tissue abnormality. Sinuses/Orbits: Globes and orbital soft tissues within normal limits. Paranasal sinuses are largely clear. No mastoid effusion. Inner ear structures normal. Other: None. MRI CERVICAL SPINE FINDINGS Alignment: Study degraded by motion artifact. Vertebral bodies normally aligned with preservation of the normal cervical lordosis. No listhesis. Vertebrae: Vertebral body heights maintained. No evidence for acute or chronic fracture. Bone marrow signal intensity mildly heterogeneous but within normal limits. Few scattered benign hemangiomas noted, most notable within the T1 and T2 vertebral bodies. No worrisome osseous lesions. No abnormal marrow edema. Cord: Patchy T2 cord signal seen within the cervical spinal cord suspicious for demyelinating disease. Most IV its foci are seen within the bilateral cord at the level of C5-6 (series 5, image 29) as well as the right aspect of the cord at C6 (series 5, image 33). No convincing foci of enhancement seen to suggest  active demyelination. Overall cord caliber within normal limits. Posterior Fossa, vertebral arteries, paraspinal tissues: Paraspinous and  prevertebral soft tissues within normal limits. Normal intravascular flow voids present within the vertebral arteries bilaterally. Disc levels: C2-C3: Unremarkable. C3-C4:  Minimal disc bulge.  No stenosis. C4-C5: Shallow right eccentric disc bulge. Flattening of the right ventral thecal sac without significant spinal stenosis. Mild right C5 foraminal narrowing. C5-C6: Broad posterior disc protrusion flattens and partially faces the ventral thecal sac. Mild spinal stenosis. No significant cord deformity. Mild bilateral C6 foraminal stenosis. C6-C7: Broad right eccentric disc protrusion extending laterally into the right neural foramen. Superimposed right-sided uncovertebral spurring. Resultant mild to moderate spinal stenosis with mild flattening of the right hemi cord. Severe right with moderate left C7 foraminal stenosis. C7-T1:  Unremarkable. MRI THORACIC SPINE FINDINGS Alignment: Normal alignment with preservation of the normal thoracic kyphosis. No listhesis. Vertebrae: Vertebral body heights well maintained. No evidence for acute or chronic fracture. Bone marrow signal intensity mildly heterogeneous but within normal limits. Few scattered benign hemangiomas noted, most notable within the T1 and T2 vertebral bodies. No worrisome osseous lesions. Cord: Subtle patchy T2 signal abnormality seen within the thoracic spinal cord, suspicious for demyelinating disease. Most obvious of these foci seen within the right cord at the level of T8-9 (series 11, image 23). No abnormal enhancement to suggest active demyelination. Cord caliber within normal limits. Paraspinous soft tissues: Paraspinous soft tissues within normal limits. Mild atelectatic changes noted within the visualized lungs. Few scattered T2 hyperintense cyst noted within the visualized kidneys. Visualized visceral  structures otherwise unremarkable. Disc levels: T6-7: Tiny right paracentral disc protrusion minimally indents the right ventral thecal sac (series 11, image 17). No significant stenosis or cord deformity. T7-8: Shallow right paracentral disc protrusion minimally flattens the ventral thecal sac without stenosis or cord deformity (series 11, image 20). T9-10: Tiny central disc protrusion indents the ventral thecal sac without stenosis or cord deformity (series 11, image 29). No other significant degenerative changes within the thoracic spine. No canal or foraminal stenosis. IMPRESSION: MRI HEAD IMPRESSION: 1. Cerebral white matter changes consistent with chronic demyelinating disease. No evidence for active demyelination identified. 2. Suspected superimposed component of chronic micro vessel ischemic changes with remote right basal ganglia lacunar infarct. MRI CERVICAL SPINE IMPRESSION: 1. Subtle patchy cord signal abnormality within the cervical spinal cord, consistent with underlying demyelinating disease. No evidence for active demyelination. 2. Right eccentric disc osteophyte complex at C6-7 with resultant mild to moderate canal and severe right C6 foraminal stenosis. 3. Broad central disc protrusion at C5-6 with mild canal and bilateral C6 foraminal stenosis. MRI THORACIC SPINE IMPRESSION: 1. Subtle patchy cord signal abnormality within the thoracic spinal cord, consistent with underlying demyelinating disease. No evidence for active demyelination. 2. Small disc protrusions at T6-7, T7-8, and T9-10 without stenosis or cord deformity. Electronically Signed   By: Rise MuBenjamin  McClintock M.D.   On: 11/05/2017 01:47   Mr Thoracic Spine W Wo Contrast  Result Date: 11/05/2017 CLINICAL DATA:  66 year old male with history of multiple sclerosis, with worsened gait dysfunction. EXAM: MRI HEAD WITHOUT AND WITH CONTRAST MRI CERVICAL SPINE WITHOUT AND WITH CONTRAST MRI THORACIC SPINE WITHOUT AND WITH CONTRAST TECHNIQUE:  Multiplanar, multiecho pulse sequences of the brain and surrounding structures, and cervical spine, to include the craniocervical junction and cervicothoracic junction, were obtained without and with intravenous contrast. CONTRAST:  19mL MULTIHANCE GADOBENATE DIMEGLUMINE 529 MG/ML IV SOLN COMPARISON:  None available. FINDINGS: MRI HEAD FINDINGS Brain: Study degraded by motion artifact. Generalized age-related cerebral atrophy. Multifocal patchy T2/FLAIR hyperintensities seen involving the periventricular, deep, and subcortical white  matter both cerebral hemispheres. Many of these foci oriented perpendicular to the lateral ventricles in a distribution suggestive of underlying demyelinating disease/ multiple sclerosis. Scattered T1 hypointense lesions compatible with chronic demyelinating plaques. A superimposed component of mild chronic small vessel ischemia may be present as well. Suspected superimposed remote lacunar infarct present within the right basal ganglia. No significant involvement of the brainstem or posterior fossa. No abnormal restricted diffusion or enhancement to suggest active demyelination. No evidence for acute or subacute infarct. Gray-white matter differentiation maintained. No evidence for acute intracranial hemorrhage. Probable trace chronic hemorrhage associated with the right basal ganglia lacunar infarct noted. No mass lesion, midline shift, or mass effect. No hydrocephalus. No extra-axial fluid collection. Major dural sinuses are patent. Pituitary gland and suprasellar region within normal limits. Midline structures intact and normal. Vascular: Major intracranial vascular flow voids are maintained. Skull and upper cervical spine: Craniocervical junction within normal limits. Bone marrow signal intensity heterogeneous. No scalp soft tissue abnormality. Sinuses/Orbits: Globes and orbital soft tissues within normal limits. Paranasal sinuses are largely clear. No mastoid effusion. Inner ear  structures normal. Other: None. MRI CERVICAL SPINE FINDINGS Alignment: Study degraded by motion artifact. Vertebral bodies normally aligned with preservation of the normal cervical lordosis. No listhesis. Vertebrae: Vertebral body heights maintained. No evidence for acute or chronic fracture. Bone marrow signal intensity mildly heterogeneous but within normal limits. Few scattered benign hemangiomas noted, most notable within the T1 and T2 vertebral bodies. No worrisome osseous lesions. No abnormal marrow edema. Cord: Patchy T2 cord signal seen within the cervical spinal cord suspicious for demyelinating disease. Most IV its foci are seen within the bilateral cord at the level of C5-6 (series 5, image 29) as well as the right aspect of the cord at C6 (series 5, image 33). No convincing foci of enhancement seen to suggest active demyelination. Overall cord caliber within normal limits. Posterior Fossa, vertebral arteries, paraspinal tissues: Paraspinous and prevertebral soft tissues within normal limits. Normal intravascular flow voids present within the vertebral arteries bilaterally. Disc levels: C2-C3: Unremarkable. C3-C4:  Minimal disc bulge.  No stenosis. C4-C5: Shallow right eccentric disc bulge. Flattening of the right ventral thecal sac without significant spinal stenosis. Mild right C5 foraminal narrowing. C5-C6: Broad posterior disc protrusion flattens and partially faces the ventral thecal sac. Mild spinal stenosis. No significant cord deformity. Mild bilateral C6 foraminal stenosis. C6-C7: Broad right eccentric disc protrusion extending laterally into the right neural foramen. Superimposed right-sided uncovertebral spurring. Resultant mild to moderate spinal stenosis with mild flattening of the right hemi cord. Severe right with moderate left C7 foraminal stenosis. C7-T1:  Unremarkable. MRI THORACIC SPINE FINDINGS Alignment: Normal alignment with preservation of the normal thoracic kyphosis. No listhesis.  Vertebrae: Vertebral body heights well maintained. No evidence for acute or chronic fracture. Bone marrow signal intensity mildly heterogeneous but within normal limits. Few scattered benign hemangiomas noted, most notable within the T1 and T2 vertebral bodies. No worrisome osseous lesions. Cord: Subtle patchy T2 signal abnormality seen within the thoracic spinal cord, suspicious for demyelinating disease. Most obvious of these foci seen within the right cord at the level of T8-9 (series 11, image 23). No abnormal enhancement to suggest active demyelination. Cord caliber within normal limits. Paraspinous soft tissues: Paraspinous soft tissues within normal limits. Mild atelectatic changes noted within the visualized lungs. Few scattered T2 hyperintense cyst noted within the visualized kidneys. Visualized visceral structures otherwise unremarkable. Disc levels: T6-7: Tiny right paracentral disc protrusion minimally indents the right ventral thecal sac (  series 11, image 17). No significant stenosis or cord deformity. T7-8: Shallow right paracentral disc protrusion minimally flattens the ventral thecal sac without stenosis or cord deformity (series 11, image 20). T9-10: Tiny central disc protrusion indents the ventral thecal sac without stenosis or cord deformity (series 11, image 29). No other significant degenerative changes within the thoracic spine. No canal or foraminal stenosis. IMPRESSION: MRI HEAD IMPRESSION: 1. Cerebral white matter changes consistent with chronic demyelinating disease. No evidence for active demyelination identified. 2. Suspected superimposed component of chronic micro vessel ischemic changes with remote right basal ganglia lacunar infarct. MRI CERVICAL SPINE IMPRESSION: 1. Subtle patchy cord signal abnormality within the cervical spinal cord, consistent with underlying demyelinating disease. No evidence for active demyelination. 2. Right eccentric disc osteophyte complex at C6-7 with  resultant mild to moderate canal and severe right C6 foraminal stenosis. 3. Broad central disc protrusion at C5-6 with mild canal and bilateral C6 foraminal stenosis. MRI THORACIC SPINE IMPRESSION: 1. Subtle patchy cord signal abnormality within the thoracic spinal cord, consistent with underlying demyelinating disease. No evidence for active demyelination. 2. Small disc protrusions at T6-7, T7-8, and T9-10 without stenosis or cord deformity. Electronically Signed   By: Rise Mu M.D.   On: 11/05/2017 01:47        Scheduled Meds: . enoxaparin (LOVENOX) injection  40 mg Subcutaneous Q24H  . pantoprazole  40 mg Oral Daily  . tamsulosin  0.8 mg Oral QHS   Continuous Infusions: . cefTRIAXone (ROCEPHIN)  IV    . methylPREDNISolone (SOLU-MEDROL) injection Stopped (11/05/17 1339)     LOS: 1 day    Time spent: 30 minutes    Katrinka Blazing, MD [facility] Pager [phone]  If 7PM-7AM, please contact night-coverage [email or web address] Password [email or web address] 11/05/2017, 3:31 PM

## 2017-11-05 NOTE — Care Management Note (Signed)
Case Management Note  Patient Details  Name: MAKSIM SICOTTE MRN: [id number] Date of Birth: [birth year]  Subjective/Objective:                  66 y.o. male with a history of multiple sclerosis who was started on ocrevus in August who presents with worsening gait dysfunction.  He states that over the past couple of days, it has been getting progressively difficult to walk or stand up and today he was unable to get out of his chair at all.  This represents a pretty marked difference from typical    Action/Plan: Date: November 05, 2017 Marcelle Smiling, BSN, [place], [place]  [phone] Chart and notes review for patient progress and needs. Will follow for case management and discharge needs. Next review date: [id number] Expected Discharge Date:  (unknown)               Expected Discharge Plan:  Home/Self Care  In-House Referral:     Discharge planning Services  CM Consult  Post Acute Care Choice:    Choice offered to:     DME Arranged:    DME Agency:     HH Arranged:    HH Agency:     Status of Service:  In process, will continue to follow  If discussed at Long Length of Stay Meetings, dates discussed:    Additional Comments:  Golda Acre, RN 11/05/2017, 8:45 AM

## 2017-11-06 ENCOUNTER — Telehealth: Payer: Self-pay | Admitting: Internal Medicine

## 2017-11-06 DIAGNOSIS — L8990 Pressure ulcer of unspecified site, unspecified stage: Secondary | ICD-10-CM

## 2017-11-06 LAB — URINE CULTURE

## 2017-11-06 MED ORDER — CEFUROXIME AXETIL 500 MG PO TABS: 500.0000 mg | ORAL_TABLET | Freq: Two times a day (BID) | ORAL | 0 refills | Status: AC

## 2017-11-06 NOTE — Progress Notes (Signed)
Date: November 06, 2017 Chart review for discharge needs:  None found for case management. Patient has no questions concerning post hospital care. [facility] CALLED FOR HOME TRANSPORT

## 2017-11-06 NOTE — Telephone Encounter (Signed)
Please call the patient, needs hospital follow-up, TCM

## 2017-11-06 NOTE — Progress Notes (Signed)
Patient is discharged to home by [facility]; notified CM. Discharge instruction including medications and appointments was given to patient by RN; Patient has no question.

## 2017-11-06 NOTE — Discharge Summary (Signed)
Physician Discharge Summary  Stephen Wade GNF:[id number] DOB: 1951/05/28 DOA: 11/04/2017  PCP: Stephen Plump, MD  Admit date: 11/04/2017 Discharge date: 11/06/2017  Admitted From: Home Disposition: Home  Recommendations for Outpatient Follow-up:  1. Follow up with PCP in 1-2 weeks 2. Schedule follow-up with outpatient neurology 3. Repeat urinalysis at primary care physician follow-up to ensure clearance of infection  Home Health: None Equipment/Devices: None  Discharge Condition: Stable CODE STATUS: Full code Diet recommendation: Regular  Brief/Interim Summary: Stephen Wade a 66 y.o.malewith medical history significant ofmultiple sclerosis onOcrevus,gastroesophageal reflux presents with increased generalized weakness that started over the past several days prior to hospital admission. In addition, patient was noted to have a darker than usual urine. Patient denies any fevers chills or sweats. Patient denies any dysuria or abdominal discomfort. Given markedly worsened weakness, patient was brought to the emergency department for further workup  ED Course:In the emergency department, patient was noted to have a urinalysis which was suggestive of urinary tract infection. Was started on empiric Rocephin. Allergy was consulted through the emergency department who had recommended inpatient hospital admission with daily high-dose steroids for at least 5 days. Hospitalist service consulted for consideration for admission.   Patient was seen by neurology who recommended 3 days of IV high-dose Solu-Medrol.  They also suggested patient to be treated for a urinary tract infection and follow-up with his outpatient neurologist at time of discharge.  Urine culture and sensitivities resulted.  Urine culture positive for PROVIDENCIA STUARTII.  Patient was transitioned from IV Rocephin to p.o. Ceftin on day of discharge.  He was instructed to make an outpatient appointment with his  neurologist for follow-up.  He was instructed to follow-up with his primary care provider within the next 7-10 days.  Of note during hospitalization patient was offered evaluation by PT OT.  He declined evaluation and treatment by physical and Occupational Therapy.  Discharge Diagnoses:  Principal Problem:   MULTIPLE SCLEROSIS Active Problems:   Weakness   Acute lower UTI   Multiple sclerosis exacerbation (HCC)   Pressure injury of skin    Discharge Instructions  Discharge Instructions    Call MD for:  difficulty breathing, headache or visual disturbances   Complete by:  As directed    Call MD for:  extreme fatigue   Complete by:  As directed    Call MD for:  hives   Complete by:  As directed    Call MD for:  persistant dizziness or light-headedness   Complete by:  As directed    Call MD for:  persistant nausea and vomiting   Complete by:  As directed    Call MD for:  severe uncontrolled pain   Complete by:  As directed    Call MD for:  temperature >100.4   Complete by:  As directed    Diet general   Complete by:  As directed    Discharge instructions   Complete by:  As directed    Finish course of antibiotics as prescribed   Increase activity slowly   Complete by:  As directed      Allergies as of 11/06/2017   No Known Allergies     Medication List    STOP taking these medications   TECFIDERA 240 MG Cpdr Generic drug:  Dimethyl Fumarate     TAKE these medications   baclofen 10 MG tablet Commonly known as:  LIORESAL TAKE 1 TABLET BY MOUTH 3 TIMES A DAY What changed:  how much to take  how to take this  when to take this   cefUROXime 500 MG tablet Commonly known as:  CEFTIN Take 1 tablet (500 mg total) 2 (two) times daily for 5 days by mouth.   ENSURE Take 237 mLs daily by mouth.   OCREVUS 300 MG/10ML injection Generic drug:  ocrelizumab Inject 300 mg every 6 (six) months into the vein.   omeprazole 20 MG capsule Commonly known as:   PRILOSEC Take 20 mg daily as needed by mouth (for acid reflex).   tamsulosin 0.4 MG Caps capsule Commonly known as:  FLOMAX Take 0.8 mg by mouth at bedtime.      Follow-up Information    Stephen PlumpPaz, Stephen E, MD. Schedule an appointment as soon as possible for a visit in 1 week(s).   Specialty:  Internal Medicine Contact information: [address] [place] [id number] [phone]          No Known Allergies  Consultations:  Neurology   Procedures/Studies: Mr Laqueta JeanBrain W Wo Contrast  Result Date: 11/05/2017 CLINICAL DATA:  66 year old male with history of multiple sclerosis, with worsened gait dysfunction. EXAM: MRI HEAD WITHOUT AND WITH CONTRAST MRI CERVICAL SPINE WITHOUT AND WITH CONTRAST MRI THORACIC SPINE WITHOUT AND WITH CONTRAST TECHNIQUE: Multiplanar, multiecho pulse sequences of the brain and surrounding structures, and cervical spine, to include the craniocervical junction and cervicothoracic junction, were obtained without and with intravenous contrast. CONTRAST:  19mL MULTIHANCE GADOBENATE DIMEGLUMINE 529 MG/ML IV SOLN COMPARISON:  None available. FINDINGS: MRI HEAD FINDINGS Brain: Study degraded by motion artifact. Generalized age-related cerebral atrophy. Multifocal patchy T2/FLAIR hyperintensities seen involving the periventricular, deep, and subcortical white matter both cerebral hemispheres. Many of these foci oriented perpendicular to the lateral ventricles in a distribution suggestive of underlying demyelinating disease/ multiple sclerosis. Scattered T1 hypointense lesions compatible with chronic demyelinating plaques. A superimposed component of mild chronic small vessel ischemia may be present as well. Suspected superimposed remote lacunar infarct present within the right basal ganglia. No significant involvement of the brainstem or posterior fossa. No abnormal restricted diffusion or enhancement to suggest active demyelination. No evidence for acute or  subacute infarct. Gray-white matter differentiation maintained. No evidence for acute intracranial hemorrhage. Probable trace chronic hemorrhage associated with the right basal ganglia lacunar infarct noted. No mass lesion, midline shift, or mass effect. No hydrocephalus. No extra-axial fluid collection. Major dural sinuses are patent. Pituitary gland and suprasellar region within normal limits. Midline structures intact and normal. Vascular: Major intracranial vascular flow voids are maintained. Skull and upper cervical spine: Craniocervical junction within normal limits. Bone marrow signal intensity heterogeneous. No scalp soft tissue abnormality. Sinuses/Orbits: Globes and orbital soft tissues within normal limits. Paranasal sinuses are largely clear. No mastoid effusion. Inner ear structures normal. Other: None. MRI CERVICAL SPINE FINDINGS Alignment: Study degraded by motion artifact. Vertebral bodies normally aligned with preservation of the normal cervical lordosis. No listhesis. Vertebrae: Vertebral body heights maintained. No evidence for acute or chronic fracture. Bone marrow signal intensity mildly heterogeneous but within normal limits. Few scattered benign hemangiomas noted, most notable within the T1 and T2 vertebral bodies. No worrisome osseous lesions. No abnormal marrow edema. Cord: Patchy T2 cord signal seen within the cervical spinal cord suspicious for demyelinating disease. Most IV its foci are seen within the bilateral cord at the level of C5-6 (series 5, image 29) as well as the right aspect of the cord at C6 (series 5, image 33). No convincing foci of  enhancement seen to suggest active demyelination. Overall cord caliber within normal limits. Posterior Fossa, vertebral arteries, paraspinal tissues: Paraspinous and prevertebral soft tissues within normal limits. Normal intravascular flow voids present within the vertebral arteries bilaterally. Disc levels: C2-C3: Unremarkable. C3-C4:  Minimal  disc bulge.  No stenosis. C4-C5: Shallow right eccentric disc bulge. Flattening of the right ventral thecal sac without significant spinal stenosis. Mild right C5 foraminal narrowing. C5-C6: Broad posterior disc protrusion flattens and partially faces the ventral thecal sac. Mild spinal stenosis. No significant cord deformity. Mild bilateral C6 foraminal stenosis. C6-C7: Broad right eccentric disc protrusion extending laterally into the right neural foramen. Superimposed right-sided uncovertebral spurring. Resultant mild to moderate spinal stenosis with mild flattening of the right hemi cord. Severe right with moderate left C7 foraminal stenosis. C7-T1:  Unremarkable. MRI THORACIC SPINE FINDINGS Alignment: Normal alignment with preservation of the normal thoracic kyphosis. No listhesis. Vertebrae: Vertebral body heights well maintained. No evidence for acute or chronic fracture. Bone marrow signal intensity mildly heterogeneous but within normal limits. Few scattered benign hemangiomas noted, most notable within the T1 and T2 vertebral bodies. No worrisome osseous lesions. Cord: Subtle patchy T2 signal abnormality seen within the thoracic spinal cord, suspicious for demyelinating disease. Most obvious of these foci seen within the right cord at the level of T8-9 (series 11, image 23). No abnormal enhancement to suggest active demyelination. Cord caliber within normal limits. Paraspinous soft tissues: Paraspinous soft tissues within normal limits. Mild atelectatic changes noted within the visualized lungs. Few scattered T2 hyperintense cyst noted within the visualized kidneys. Visualized visceral structures otherwise unremarkable. Disc levels: T6-7: Tiny right paracentral disc protrusion minimally indents the right ventral thecal sac (series 11, image 17). No significant stenosis or cord deformity. T7-8: Shallow right paracentral disc protrusion minimally flattens the ventral thecal sac without stenosis or cord  deformity (series 11, image 20). T9-10: Tiny central disc protrusion indents the ventral thecal sac without stenosis or cord deformity (series 11, image 29). No other significant degenerative changes within the thoracic spine. No canal or foraminal stenosis. IMPRESSION: MRI HEAD IMPRESSION: 1. Cerebral white matter changes consistent with chronic demyelinating disease. No evidence for active demyelination identified. 2. Suspected superimposed component of chronic micro vessel ischemic changes with remote right basal ganglia lacunar infarct. MRI CERVICAL SPINE IMPRESSION: 1. Subtle patchy cord signal abnormality within the cervical spinal cord, consistent with underlying demyelinating disease. No evidence for active demyelination. 2. Right eccentric disc osteophyte complex at C6-7 with resultant mild to moderate canal and severe right C6 foraminal stenosis. 3. Broad central disc protrusion at C5-6 with mild canal and bilateral C6 foraminal stenosis. MRI THORACIC SPINE IMPRESSION: 1. Subtle patchy cord signal abnormality within the thoracic spinal cord, consistent with underlying demyelinating disease. No evidence for active demyelination. 2. Small disc protrusions at T6-7, T7-8, and T9-10 without stenosis or cord deformity. Electronically Signed   By: Rise Mu M.D.   On: 11/05/2017 01:47   Mr Cervical Spine W Wo Contrast  Result Date: 11/05/2017 CLINICAL DATA:  66 year old male with history of multiple sclerosis, with worsened gait dysfunction. EXAM: MRI HEAD WITHOUT AND WITH CONTRAST MRI CERVICAL SPINE WITHOUT AND WITH CONTRAST MRI THORACIC SPINE WITHOUT AND WITH CONTRAST TECHNIQUE: Multiplanar, multiecho pulse sequences of the brain and surrounding structures, and cervical spine, to include the craniocervical junction and cervicothoracic junction, were obtained without and with intravenous contrast. CONTRAST:  19mL MULTIHANCE GADOBENATE DIMEGLUMINE 529 MG/ML IV SOLN COMPARISON:  None available.  FINDINGS: MRI HEAD FINDINGS Brain:  Study degraded by motion artifact. Generalized age-related cerebral atrophy. Multifocal patchy T2/FLAIR hyperintensities seen involving the periventricular, deep, and subcortical white matter both cerebral hemispheres. Many of these foci oriented perpendicular to the lateral ventricles in a distribution suggestive of underlying demyelinating disease/ multiple sclerosis. Scattered T1 hypointense lesions compatible with chronic demyelinating plaques. A superimposed component of mild chronic small vessel ischemia may be present as well. Suspected superimposed remote lacunar infarct present within the right basal ganglia. No significant involvement of the brainstem or posterior fossa. No abnormal restricted diffusion or enhancement to suggest active demyelination. No evidence for acute or subacute infarct. Gray-white matter differentiation maintained. No evidence for acute intracranial hemorrhage. Probable trace chronic hemorrhage associated with the right basal ganglia lacunar infarct noted. No mass lesion, midline shift, or mass effect. No hydrocephalus. No extra-axial fluid collection. Major dural sinuses are patent. Pituitary gland and suprasellar region within normal limits. Midline structures intact and normal. Vascular: Major intracranial vascular flow voids are maintained. Skull and upper cervical spine: Craniocervical junction within normal limits. Bone marrow signal intensity heterogeneous. No scalp soft tissue abnormality. Sinuses/Orbits: Globes and orbital soft tissues within normal limits. Paranasal sinuses are largely clear. No mastoid effusion. Inner ear structures normal. Other: None. MRI CERVICAL SPINE FINDINGS Alignment: Study degraded by motion artifact. Vertebral bodies normally aligned with preservation of the normal cervical lordosis. No listhesis. Vertebrae: Vertebral body heights maintained. No evidence for acute or chronic fracture. Bone marrow signal intensity  mildly heterogeneous but within normal limits. Few scattered benign hemangiomas noted, most notable within the T1 and T2 vertebral bodies. No worrisome osseous lesions. No abnormal marrow edema. Cord: Patchy T2 cord signal seen within the cervical spinal cord suspicious for demyelinating disease. Most IV its foci are seen within the bilateral cord at the level of C5-6 (series 5, image 29) as well as the right aspect of the cord at C6 (series 5, image 33). No convincing foci of enhancement seen to suggest active demyelination. Overall cord caliber within normal limits. Posterior Fossa, vertebral arteries, paraspinal tissues: Paraspinous and prevertebral soft tissues within normal limits. Normal intravascular flow voids present within the vertebral arteries bilaterally. Disc levels: C2-C3: Unremarkable. C3-C4:  Minimal disc bulge.  No stenosis. C4-C5: Shallow right eccentric disc bulge. Flattening of the right ventral thecal sac without significant spinal stenosis. Mild right C5 foraminal narrowing. C5-C6: Broad posterior disc protrusion flattens and partially faces the ventral thecal sac. Mild spinal stenosis. No significant cord deformity. Mild bilateral C6 foraminal stenosis. C6-C7: Broad right eccentric disc protrusion extending laterally into the right neural foramen. Superimposed right-sided uncovertebral spurring. Resultant mild to moderate spinal stenosis with mild flattening of the right hemi cord. Severe right with moderate left C7 foraminal stenosis. C7-T1:  Unremarkable. MRI THORACIC SPINE FINDINGS Alignment: Normal alignment with preservation of the normal thoracic kyphosis. No listhesis. Vertebrae: Vertebral body heights well maintained. No evidence for acute or chronic fracture. Bone marrow signal intensity mildly heterogeneous but within normal limits. Few scattered benign hemangiomas noted, most notable within the T1 and T2 vertebral bodies. No worrisome osseous lesions. Cord: Subtle patchy T2 signal  abnormality seen within the thoracic spinal cord, suspicious for demyelinating disease. Most obvious of these foci seen within the right cord at the level of T8-9 (series 11, image 23). No abnormal enhancement to suggest active demyelination. Cord caliber within normal limits. Paraspinous soft tissues: Paraspinous soft tissues within normal limits. Mild atelectatic changes noted within the visualized lungs. Few scattered T2 hyperintense cyst noted within the visualized  kidneys. Visualized visceral structures otherwise unremarkable. Disc levels: T6-7: Tiny right paracentral disc protrusion minimally indents the right ventral thecal sac (series 11, image 17). No significant stenosis or cord deformity. T7-8: Shallow right paracentral disc protrusion minimally flattens the ventral thecal sac without stenosis or cord deformity (series 11, image 20). T9-10: Tiny central disc protrusion indents the ventral thecal sac without stenosis or cord deformity (series 11, image 29). No other significant degenerative changes within the thoracic spine. No canal or foraminal stenosis. IMPRESSION: MRI HEAD IMPRESSION: 1. Cerebral white matter changes consistent with chronic demyelinating disease. No evidence for active demyelination identified. 2. Suspected superimposed component of chronic micro vessel ischemic changes with remote right basal ganglia lacunar infarct. MRI CERVICAL SPINE IMPRESSION: 1. Subtle patchy cord signal abnormality within the cervical spinal cord, consistent with underlying demyelinating disease. No evidence for active demyelination. 2. Right eccentric disc osteophyte complex at C6-7 with resultant mild to moderate canal and severe right C6 foraminal stenosis. 3. Broad central disc protrusion at C5-6 with mild canal and bilateral C6 foraminal stenosis. MRI THORACIC SPINE IMPRESSION: 1. Subtle patchy cord signal abnormality within the thoracic spinal cord, consistent with underlying demyelinating disease. No  evidence for active demyelination. 2. Small disc protrusions at T6-7, T7-8, and T9-10 without stenosis or cord deformity. Electronically Signed   By: Benjamin  McClintock M.D.   On: 11/05/2017 01:47   Mr Thoracic Spine W Wo Contrast  Result Date: 11/14Gold[employer] West FinancialLa FolletKoreatehPriDonnetta Rayna [email or web address] EAD [email or web address] Brain: Study degraded by motion artifact. Generalized age-related cerebral atrophy. Multifocal patchy T2/FLAIR hyperintensities seen involving the periventricular, deep, and subcortical white matter both cerebral hemispheres. Many of these foci oriented perpendicular to the lateral ventricles in a distribution suggestive of underlying demyelinating disease/ multiple sclerosis. Scattered T1 hypointense lesions compatible with chronic demyelinating plaques. A superimposed component of mild chronic small vessel ischemia may be present as well. Suspected superimposed remote lacunar infarct present within the right basal ganglia. No significant involvement of the brainstem or posterior fossa. No abnormal restricted diffusion or enhancement to suggest active demyelination. No evidence for acute or subacute infarct. Gray-white matter differentiation maintained. No evidence for acute intracranial hemorrhage. Probable trace chronic hemorrhage associated with the right basal ganglia lacunar infarct noted. No mass lesion, midline shift, or mass effect. No hydrocephalus. No extra-axial fluid  collection. Major dural sinuses are patent. Pituitary gland and suprasellar region within normal limits. Midline structures intact and normal. Vascular: Major intracranial vascular flow voids are maintained. Skull and upper cervical spine: Craniocervical junction within normal limits. Bone marrow signal intensity heterogeneous. No scalp soft tissue abnormality. Sinuses/Orbits: Globes and orbital soft tissues within normal limits. Paranasal sinuses are largely clear. No mastoid effusion. Inner ear structures normal. Other: None. MRI CERVICAL SPINE FINDINGS Alignment: Study degraded by motion artifact. Vertebral bodies normally aligned with preservation of the normal cervical lordosis. No listhesis. Vertebrae: Vertebral body heights maintained. No evidence for acute or chronic fracture. Bone marrow signal intensity mildly heterogeneous but within normal limits. Few scattered benign hemangiomas noted, most notable within the T1 and T2  vertebral bodies. No worrisome osseous lesions. No abnormal marrow edema. Cord: Patchy T2 cord signal seen within the cervical spinal cord suspicious for demyelinating disease. Most IV its foci are seen within the bilateral cord at the level of C5-6 (series 5, image 29) as well as the right aspect of the cord at C6 (series 5, image 33). No convincing foci of enhancement seen to suggest active demyelination. Overall cord caliber within normal limits. Posterior Fossa, vertebral arteries, paraspinal tissues: Paraspinous and prevertebral soft tissues within normal limits. Normal intravascular flow voids present within the vertebral arteries bilaterally. Disc levels: C2-C3: Unremarkable. C3-C4:  Minimal disc bulge.  No stenosis. C4-C5: Shallow right eccentric disc bulge. Flattening of the right ventral thecal sac without significant spinal stenosis. Mild right C5 foraminal narrowing. C5-C6: Broad posterior disc protrusion flattens and partially faces the ventral thecal sac. Mild spinal  stenosis. No significant cord deformity. Mild bilateral C6 foraminal stenosis. C6-C7: Broad right eccentric disc protrusion extending laterally into the right neural foramen. Superimposed right-sided uncovertebral spurring. Resultant mild to moderate spinal stenosis with mild flattening of the right hemi cord. Severe right with moderate left C7 foraminal stenosis. C7-T1:  Unremarkable. MRI THORACIC SPINE FINDINGS Alignment: Normal alignment with preservation of the normal thoracic kyphosis. No listhesis. Vertebrae: Vertebral body heights well maintained. No evidence for acute or chronic fracture. Bone marrow signal intensity mildly heterogeneous but within normal limits. Few scattered benign hemangiomas noted, most notable within the T1 and T2 vertebral bodies. No worrisome osseous lesions. Cord: Subtle patchy T2 signal abnormality seen within the thoracic spinal cord, suspicious for demyelinating disease. Most obvious of these foci seen within the right cord at the level of T8-9 (series 11, image 23). No abnormal enhancement to suggest active demyelination. Cord caliber within normal limits. Paraspinous soft tissues: Paraspinous soft tissues within normal limits. Mild atelectatic changes noted within the visualized lungs. Few scattered T2 hyperintense cyst noted within the visualized kidneys. Visualized visceral structures otherwise unremarkable. Disc levels: T6-7: Tiny right paracentral disc protrusion minimally indents the right ventral thecal sac (series 11, image 17). No significant stenosis or cord deformity. T7-8: Shallow right paracentral disc protrusion minimally flattens the ventral thecal sac without stenosis or cord deformity (series 11, image 20). T9-10: Tiny central disc protrusion indents the ventral thecal sac without stenosis or cord deformity (series 11, image 29). No other significant degenerative changes within the thoracic spine. No canal or foraminal stenosis. IMPRESSION: MRI HEAD IMPRESSION:  1. Cerebral white matter changes consistent with chronic demyelinating disease. No evidence for active demyelination identified. 2. Suspected superimposed component of chronic micro vessel ischemic changes with remote right basal ganglia lacunar infarct. MRI CERVICAL SPINE IMPRESSION: 1. Subtle patchy cord signal abnormality within the cervical spinal cord, consistent with underlying demyelinating disease. No evidence for active demyelination. 2. Right eccentric disc osteophyte complex at C6-7 with resultant mild to moderate canal and severe right C6 foraminal stenosis. 3. Broad central disc protrusion at C5-6 with mild canal and bilateral C6 foraminal stenosis. MRI THORACIC SPINE IMPRESSION: 1. Subtle patchy cord signal abnormality within the thoracic spinal cord, consistent with underlying demyelinating disease. No evidence for active demyelination. 2. Small disc protrusions at T6-7, T7-8, and T9-10 without stenosis or cord deformity. Electronically Signed   By: Rise Mu M.D.   On: 11/05/2017 01:47      Subjective: Patient seen and evaluated on bedside rounds.  He voices he feels he is back to baseline.  Wants to go home today.  Understands he needs  outpatient antibiotics for his urinary tract infection.  States he will follow-up with his primary care physician as well as his neurologist.  Denies any continued weakness.  Discharge Exam: Vitals:   11/05/17 2212 11/06/17 0543  BP: 125/77 134/73  Pulse: 94 94  Resp: 20 20  Temp: 97.6 F (36.4 C) 98 F (36.7 C)  SpO2: 96% 95%   Vitals:   11/05/17 0920 11/05/17 1450 11/05/17 2212 11/06/17 0543  BP: 124/72 121/76 125/77 134/73  Pulse: 100 (!) 102 94 94  Resp: 20 20 20 20   Temp: 97.6 F (36.4 C) (!) 97.4 F (36.3 C) 97.6 F (36.4 C) 98 F (36.7 C)  TempSrc: Oral Oral Oral Oral  SpO2: 94% 96% 96% 95%  Weight:      Height:        General: Pt is alert, awake, not in acute distress Cardiovascular: RRR, S1/S2 +, no rubs, no  gallops Respiratory: CTA bilaterally, no wheezing, no rhonchi Abdominal: Soft, NT, ND, bowel sounds + Extremities: no edema, no cyanosis, 2 / 5 strength in lower extremities bilaterally    The results of significant diagnostics from this hospitalization (including imaging, microbiology, ancillary and laboratory) are listed below for reference.     Microbiology: Recent Results (from the past 240 hour(s))  Urine culture     Status: Abnormal   Collection Time: 11/04/17  1:40 PM  Result Value Ref Range Status   Specimen Description URINE, CATHETERIZED  Final   Special Requests NONE  Final   Culture >=100,000 COLONIES/mL PROVIDENCIA STUARTII (A)  Final   Report Status 11/06/2017 FINAL  Final   Organism ID, Bacteria PROVIDENCIA STUARTII (A)  Final      Susceptibility   Providencia stuartii - MIC*    AMPICILLIN RESISTANT Resistant     CEFAZOLIN >=64 RESISTANT Resistant     CEFTRIAXONE <=1 SENSITIVE Sensitive     CIPROFLOXACIN <=0.25 SENSITIVE Sensitive     GENTAMICIN RESISTANT Resistant     IMIPENEM 1 SENSITIVE Sensitive     NITROFURANTOIN 128 RESISTANT Resistant     TRIMETH/SULFA <=20 SENSITIVE Sensitive     AMPICILLIN/SULBACTAM 4 SENSITIVE Sensitive     PIP/TAZO <=4 SENSITIVE Sensitive     * >=100,000 COLONIES/mL PROVIDENCIA STUARTII     Labs: BNP (last 3 results) No results for input(s): BNP in the last 8760 hours. Basic Metabolic Panel: Recent Labs  Lab 11/04/17 1220 11/05/17 0631  NA 140 140  K 4.2 3.9  CL 104 109  CO2 27 23  GLUCOSE 101* 173*  BUN 18 21*  CREATININE 0.78 0.85  CALCIUM 9.6 9.2   Liver Function Tests: Recent Labs  Lab 11/04/17 1220 11/05/17 0631  AST 30 26  ALT 34 28  ALKPHOS 99 86  BILITOT 1.3* 0.9  PROT 8.0 7.3  ALBUMIN 4.4 3.6   No results for input(s): LIPASE, AMYLASE in the last 168 hours. No results for input(s): AMMONIA in the last 168 hours. CBC: Recent Labs  Lab 11/04/17 1330 11/05/17 0631  WBC 7.9 11.3*  NEUTROABS 5.8  --    HGB 14.9 14.2  HCT 43.1 41.3  MCV 87.4 85.7  PLT 182 213   Cardiac Enzymes: No results for input(s): CKTOTAL, CKMB, CKMBINDEX, TROPONINI in the last 168 hours. BNP: Invalid input(s): POCBNP CBG: No results for input(s): GLUCAP in the last 168 hours. D-Dimer No results for input(s): DDIMER in the last 72 hours. Hgb A1c No results for input(s): HGBA1C in the last 72 hours. Lipid Profile No  results for input(s): CHOL, HDL, LDLCALC, TRIG, CHOLHDL, LDLDIRECT in the last 72 hours. Thyroid function studies No results for input(s): TSH, T4TOTAL, T3FREE, THYROIDAB in the last 72 hours.  Invalid input(s): FREET3 Anemia work up No results for input(s): VITAMINB12, FOLATE, FERRITIN, TIBC, IRON, RETICCTPCT in the last 72 hours. Urinalysis    Component Value Date/Time   COLORURINE YELLOW 11/04/2017 1340   APPEARANCEUR HAZY (A) 11/04/2017 1340   LABSPEC 1.019 11/04/2017 1340   PHURINE 6.0 11/04/2017 1340   GLUCOSEU NEGATIVE 11/04/2017 1340   GLUCOSEU NEGATIVE 03/11/2013 1106   HGBUR NEGATIVE 11/04/2017 1340   HGBUR large 10/05/2010 1242   BILIRUBINUR NEGATIVE 11/04/2017 1340   BILIRUBINUR neg 03/11/2013 1039   KETONESUR 20 (A) 11/04/2017 1340   PROTEINUR NEGATIVE 11/04/2017 1340   UROBILINOGEN 0.2 03/17/2013 1005   NITRITE POSITIVE (A) 11/04/2017 1340   LEUKOCYTESUR MODERATE (A) 11/04/2017 1340   Sepsis Labs Invalid input(s): PROCALCITONIN,  WBC,  LACTICIDVEN Microbiology Recent Results (from the past 240 hour(s))  Urine culture     Status: Abnormal   Collection Time: 11/04/17  1:40 PM  Result Value Ref Range Status   Specimen Description URINE, CATHETERIZED  Final   Special Requests NONE  Final   Culture >=100,000 COLONIES/mL PROVIDENCIA STUARTII (A)  Final   Report Status 11/06/2017 FINAL  Final   Organism ID, Bacteria PROVIDENCIA STUARTII (A)  Final      Susceptibility   Providencia stuartii - MIC*    AMPICILLIN RESISTANT Resistant     CEFAZOLIN >=64 RESISTANT  Resistant     CEFTRIAXONE <=1 SENSITIVE Sensitive     CIPROFLOXACIN <=0.25 SENSITIVE Sensitive     GENTAMICIN RESISTANT Resistant     IMIPENEM 1 SENSITIVE Sensitive     NITROFURANTOIN 128 RESISTANT Resistant     TRIMETH/SULFA <=20 SENSITIVE Sensitive     AMPICILLIN/SULBACTAM 4 SENSITIVE Sensitive     PIP/TAZO <=4 SENSITIVE Sensitive     * >=100,000 COLONIES/mL PROVIDENCIA STUARTII     Time coordinating discharge: 35 minutes  SIGNED:   Katrinka Blazing, MD  [facility] 11/06/2017, 12:36 PM Pager [phone] If 7PM-7AM, please contact night-coverage [email or web address] Password [email or web address]

## 2017-11-06 NOTE — Progress Notes (Signed)
Subjective: Patient states he feels back to his baseline this morning.  Exam: Vitals:   11/05/17 2212 11/06/17 0543  BP: 125/77 134/73  Pulse: 94 94  Resp: 20 20  Temp: 97.6 F (36.4 C) 98 F (36.7 C)  SpO2: 96% 95%    HEENT-  Normocephalic, no lesions, without obvious abnormality.  Normal external eye and conjunctiva.  Normal TM's bilaterally.  Normal auditory canals and external ears. Normal external nose, mucus membranes and septum.  Normal pharynx. Cardiovascular- S1, S2 normal, pulses palpable throughout   Lungs- chest clear, no wheezing, rales, normal symmetric air entry    Neuro: --No significant change on physical exam of lower extremities however he does show that he is stronger in his right arm today YW:VPXTGG are equal and round. They are symmetrically reactive from 3-->2 mm. EOMI without nystagmus. Facial sensation is intact to light touch. Face is symmetric at rest with normal strength and mobility. Hearing is intact to conversational voice. Palate elevates symmetrically and uvula is midline. Voice is normal in tone, pitch and quality. Bilateral SCM and trapezii are 5/5. Tongue is midline with normal bulk and mobility.  Motor:  Tone is normal. Bulk is normal.He has 4+/5 right arm strength, and the right leg he is able to flicker his right ankle with plantar flexion, 2/5 hip abduction/adduction, otherwise 0/5, in the left leg he has 3/5 strength.  Sensation: Intact to light touch.  DTRs:3+ in the left knee and ankle, sustained clonus in the right ankle    Medications:  Scheduled: . enoxaparin (LOVENOX) injection  40 mg Subcutaneous Q24H  . pantoprazole  40 mg Oral Daily  . tamsulosin  0.8 mg Oral QHS   Continuous: . cefTRIAXone (ROCEPHIN)  IV 1 g (11/05/17 1616)  . methylPREDNISolone (SOLU-MEDROL) injection Stopped (11/05/17 1339)    Pertinent Labs/Diagnostics: None       Impression: 66 year old male with worsening gait dysfunction in the setting  of multiple sclerosis.  MRI does not show any lesions.He does have a UTI, and it is likely that this is recrudescence of previous symptoms.      Recommendations: 1) continue Solu-Medrol for a total of 3 doses, continue to treat UTI, PT OT  At this point neurology will sign off and patient should follow-up with his outpatient neurologist at discharge  Felicie Morn PA-C [facility] [phone]  11/06/2017, 8:53 AM

## 2017-11-07 NOTE — Telephone Encounter (Signed)
thx

## 2017-11-07 NOTE — Telephone Encounter (Signed)
MATTHEWS GARNIER OHY:[id number] DOB: 1951/04/01 DOA: 11/04/2017  PCP: Wanda Plump, MD  Admit date: 11/04/2017 Discharge date: 11/06/2017  Admitted From: Home Disposition: Home  Recommendations for Outpatient Follow-up:  1. Follow up with PCP in 1-2 weeks 2. Schedule follow-up with outpatient neurology 3. Repeat urinalysis at primary care physician follow-up to ensure clearance of infection  Home Health: None Equipment/Devices: None  Discharge Condition: Stable CODE STATUS: Full code Diet recommendation: Regular  Transition Care Management Follow-up Telephone Call   How have you been since you were released from the hospital? Been doing well   Do you understand why you were in the hospital? yes   Do you understand the discharge instructions? yes   Where were you discharged to? Home with wife    Items Reviewed:  Medications reviewed: yes  Allergies reviewed: yes  Dietary changes reviewed: yes  Referrals reviewed: yes   Functional Questionnaire:   Activities of Daily Living (ADLs):   He states they are independent in the following: Pt states that he is back to baseline. States that he occasionally needs assistance from his wife with dressing and shower, and uses wheelchair or scooter due to MS. Pt states he feeds himself.   Any transportation issues/concerns?: no   Any patient concerns? no   Confirmed importance and date/time of follow-up visits scheduled yes  Provider Appointment booked with Dr.Paz 11/18/17 @1040   Confirmed with patient if condition begins to worsen call PCP or go to the ER.  Patient was given the office number and encouraged to call back with question or concerns.  : yes

## 2017-11-18 ENCOUNTER — Inpatient Hospital Stay: Payer: BLUE CROSS/BLUE SHIELD | Admitting: Internal Medicine

## 2017-11-27 ENCOUNTER — Ambulatory Visit: Payer: BLUE CROSS/BLUE SHIELD | Admitting: Neurology

## 2017-12-24 ENCOUNTER — Telehealth: Payer: Self-pay | Admitting: Neurology

## 2017-12-24 MED ORDER — BACLOFEN 10 MG PO TABS: 10.0000 mg | ORAL_TABLET | Freq: Three times a day (TID) | ORAL | 2 refills | Status: DC

## 2017-12-24 NOTE — Telephone Encounter (Signed)
Baclofen escribed to [facility] as requested/fim 

## 2017-12-24 NOTE — Telephone Encounter (Signed)
Pt called he has medicare RX plan effective today and needsbaclofen (LIORESAL) 10 MG tablet sent to [facility]/[address]. Pt said he has 4 tabs left.  Medicare: Member ID# [id number] Insureds # [phone]) RXBin # [phone]

## 2018-01-20 ENCOUNTER — Ambulatory Visit (INDEPENDENT_AMBULATORY_CARE_PROVIDER_SITE_OTHER): Payer: Medicare Other | Admitting: Neurology

## 2018-01-20 ENCOUNTER — Encounter: Payer: Self-pay | Admitting: Neurology

## 2018-01-20 VITALS — BP 114/74 | HR 94 | Ht 72.0 in | Wt 194.0 lb

## 2018-01-20 DIAGNOSIS — R4189 Other symptoms and signs involving cognitive functions and awareness: Secondary | ICD-10-CM | POA: Diagnosis not present

## 2018-01-20 DIAGNOSIS — G801 Spastic diplegic cerebral palsy: Secondary | ICD-10-CM | POA: Diagnosis not present

## 2018-01-20 DIAGNOSIS — R3911 Hesitancy of micturition: Secondary | ICD-10-CM

## 2018-01-20 DIAGNOSIS — G35 Multiple sclerosis: Secondary | ICD-10-CM | POA: Diagnosis not present

## 2018-01-20 DIAGNOSIS — R531 Weakness: Secondary | ICD-10-CM | POA: Diagnosis not present

## 2018-01-20 MED ORDER — LORAZEPAM 0.5 MG PO TABS: ORAL_TABLET | ORAL | 5 refills | Status: DC

## 2018-01-20 NOTE — Progress Notes (Signed)
[facility]  PATIENT: Stephen Wade DOB: 01/16/1951  REFERRING DOCTOR OR PCP:  Willow Ora SOURCE: Patient and wife  _________________________________   HISTORICAL  CHIEF COMPLAINT:  Chief Complaint  Patient presents with  . Multiple Sclerosis    HISTORY OF PRESENT ILLNESS:  Stephen Wade is a 67 yo man who was diagnosed with MS in 2005.  He is feeling generally weaker with worse balance and more trouble standing up.   This came on about 2 weeks ago and he feels some of the changes occurred overnight.     Update 01/20/2018: He was hospitalized for a week in November after a UTI due to urinary retention (he now cath's).   He had weakness and cognitive dysfunction.I personally reviewed the MRI's and he does not appear to have any new lesions with resolution of the he slowly returned back to his baseline. Currently, he is cathing himself 4 or 5 times a day and wears a condom cath at night in case there is any overflow.  In July, he switched to ocrelizumab from Tecfidera. He tolerated the first infusion very well.  He uses a scooter to get around that time. He has bilateral leg weakness, worse on the right.There is also mild numbness on the right. Muscle tone is increased, right greater than left. He denies any significant problems with his vision.  He has had some dysphagia and sometimes coughs up food.    He is often tired during the day.  Sleep is poor and he wakes up 3-4 times nightly.    He wakes up sometimes due to spasticity and leg pain.    Mood is about the same.    He is occasionlly irritable.   He feels better if he exercises some.    From 05/26/2017:                                                                                                                                                    MS:   He is currently on Tecfidera. He tolerates it well. We have previously discussed ocrelizumab as he had some interest in switching therapies.    We discussed the pros  and cons of staying on Tecfidera versus switching to ocrelizumab and also reviewed with the clinical study data for ocrelizumab.  Gait, strength, sensation are about the same.   He is in a scooter much of the day.   He notes more more leg weakness the past 2 weeks.    The right leg is always worse.   He continues to exercise daily with a reclining elliptical.  Bladder/bowel:   He is now having much more urinary frequency and urgency.    He has more incontinence.      He sees [name] and he has recommended urodynamics and maybe a transurethral prostate  procedure.      Fatigue/sleep:   Fatigue is tolerable most days.   Worse in heat.  He sleeps worse due to his bladder.     Mood and cognition have been stable.  He has mild skin breakdown in the sacral area.  His wife applies silver pads.   He has seen wound care in the past and we discussed him getting back in.   [facility]).      MS History:  In 2005, he woke up with right leg weakness and clumsiness.  He had MRI's of the brain and spine consistent with MS.   He was originally seeing Dr. Kathrynn Ducking at [facility].   A repeat MRI showed progression allowing the diagnosis of MS to be made.    He started on Betaseron.  Since his first exacerbation, he has had progressive gait and cognitive dysfunction but these changes have mostly been gradual.     Due to worsening, he was placed on Tysabri but felt much worse while on it.   He was also JCV Ab positive so stopped after 4-6 do7ses.   He went back to Betaseron and a couple years later transferred care to me.   We changed to Tecfidera.   He tolerates tecfidera well with mild flushing but no GI symptoms.    He feels mostly stable on Tecfidera but has had some progression of the poor gait.  He now spends much of his time in a scooter.    He had one flare-up last year, though, where he felt gait changed  A few days of IV Solu-Medrol helped to get back to a baseline.  July 2018 he switched to ocrelizumab       REVIEW OF SYSTEMS: Constitutional: No fevers, chills, sweats, or change in appetite Eyes: No visual changes, double vision, eye pain Ear, nose and throat: No hearing loss, ear pain, nasal congestion, sore throat Cardiovascular: No chest pain, palpitations.   Mild edema in legs. Respiratory: No shortness of breath at rest or with exertion.   No wheezes GastrointestinaI: No nausea, vomiting, diarrhea, abdominal pain, fecal incontinence Genitourinary: No dysuria, urinary retention or frequency.  No nocturia. Musculoskeletal: No neck pain, back pain Integumentary: No rash, pruritus, skin lesions Neurological: as above Psychiatric: as above Endocrine: No palpitations, diaphoresis, change in appetite, change in weigh or increased thirst Hematologic/Lymphatic: No anemia, purpura, petechiae. Allergic/Immunologic: No itchy/runny eyes, nasal congestion, recent allergic reactions, rashes  ALLERGIES: No Known Allergies  HOME MEDICATIONS:  Current Outpatient Medications:  .  baclofen (LIORESAL) 10 MG tablet, Take 1 tablet (10 mg total) by mouth 3 (three) times daily., Disp: 270 tablet, Rfl: 2 .  ENSURE (ENSURE), Take 237 mLs daily by mouth., Disp: , Rfl:  .  ocrelizumab (OCREVUS) 300 MG/10ML injection, Inject 300 mg every 6 (six) months into the vein., Disp: , Rfl:  .  omeprazole (PRILOSEC) 20 MG capsule, Take 20 mg daily as needed by mouth (for acid reflex). , Disp: , Rfl:  .  tamsulosin (FLOMAX) 0.4 MG CAPS capsule, Take 0.8 mg by mouth at bedtime., Disp: , Rfl: 5 .  LORazepam (ATIVAN) 0.5 MG tablet, Take one or two po qHS, Disp: 60 tablet, Rfl: 5  PAST MEDICAL HISTORY: Past Medical History:  Diagnosis Date  . GERD (gastroesophageal reflux disease)    had on- off chest pain, symptoms resolved with Prilosec 11/2010  . Lower urinary tract symptoms (LUTS)    Dr Patsi Sears  . MS (multiple sclerosis) (HCC) 2005  .  Testicular hypogonadism   . Urge incontinence of urine   .  Urolithiasis 2011    PAST SURGICAL HISTORY: Past Surgical History:  Procedure Laterality Date  . APPENDECTOMY      ruptured 03-2010  . VASECTOMY      FAMILY HISTORY: Family History  Problem Relation Age of Onset  . Heart attack Mother   . Brain cancer Father     SOCIAL HISTORY:  Social History   Socioeconomic History  . Marital status: Married    Spouse name: Not on file  . Number of children: 3  . Years of education: Not on file  . Highest education level: Not on file  Social Needs  . Financial resource strain: Not on file  . Food insecurity - worry: Not on file  . Food insecurity - inability: Not on file  . Transportation needs - medical: Not on file  . Transportation needs - non-medical: Not on file  Occupational History  . Occupation: Occupation: independent Manufacturing engineer: [employer]  Tobacco Use  . Smoking status: Never Smoker  . Smokeless tobacco: Never Used  Substance and Sexual Activity  . Alcohol use: No    Alcohol/week: 0.0 oz    Comment: rare  . Drug use: No  . Sexual activity: Not on file  Other Topics Concern  . Not on file  Social History Narrative  . Not on file     PHYSICAL EXAM  Vitals:   01/20/18 1039  BP: 114/74  Pulse: 94  Weight: 194 lb (88 kg)  Height: 6' (1.829 m)    Body mass index is 26.31 kg/m.   General: The patient is well-developed and well-nourished  Skin: Extremities are without rash and he has mild pedal edema.   He has redness in the sacral area with mild ulceration.  Neurologic Exam  Mental status: The patient is alert and oriented x 3 at the time of the examination. The patient has apparent normal recent and remote memory, with a reduced  attention span and concentration ability.   Speech is mildly dysarthric.  Cranial nerves: Extraocular movements are full. .  Facial symmetry is present. Facial strength is normal.  Trapezius and sternocleidomastoid strength is normal. No  dysarthria is noted.  The tongue is midline, and the patient has symmetric elevation of the soft palate. No obvious hearing deficits are noted.  Motor:  Muscle bulk is normal.   Tone is increased, legs > arms, right > left (normal in left arm). Strength is  5 / 5 in left arm, 4+/5 in right arm prox and 4+-5/5 grip, 2/5 in left leg hip extensors and 4-/5 distally, 2-/5 in right leg.   Sensory: Sensory testing is intact to temperature, touch and vibration sensation in all 4 extremities.  Coordination: Cerebellar testing reveals good finger-nose-finger on the left, reduced on the right  Gait and station:  Can not walk  Reflexes: Deep tendon reflexes are increased, more on the right.       DIAGNOSTIC DATA (LABS, IMAGING, TESTING) - I reviewed patient records, labs, notes, testing and imaging myself where available.  Lab Results  Component Value Date   WBC 11.3 (H) 11/05/2017   HGB 14.2 11/05/2017   HCT 41.3 11/05/2017   MCV 85.7 11/05/2017   PLT 213 11/05/2017      Component Value Date/Time   NA 140 11/05/2017 0631   NA 144 05/26/2017 1351   K 3.9 11/05/2017 0631   CL 109 11/05/2017 0631  CO2 23 11/05/2017 0631   GLUCOSE 173 (H) 11/05/2017 0631   BUN 21 (H) 11/05/2017 0631   BUN 22 05/26/2017 1351   CREATININE 0.85 11/05/2017 0631   CALCIUM 9.2 11/05/2017 0631   PROT 7.3 11/05/2017 0631   PROT 7.2 05/26/2017 1351   ALBUMIN 3.6 11/05/2017 0631   ALBUMIN 4.4 05/26/2017 1351   AST 26 11/05/2017 0631   ALT 28 11/05/2017 0631   ALKPHOS 86 11/05/2017 0631   BILITOT 0.9 11/05/2017 0631   BILITOT 0.4 05/26/2017 1351   GFRNONAA >60 11/05/2017 0631   GFRAA >60 11/05/2017 0631   Lab Results  Component Value Date   CHOL 207 (H) 01/18/2011   HDL 31.10 (L) 01/18/2011   LDLDIRECT 158.6 01/18/2011   TRIG 146.0 01/18/2011   CHOLHDL 7 01/18/2011   Lab Results  Component Value Date   HGBA1C 5.2 05/12/2014   No results found for: [id number] Lab Results  Component Value Date    TSH 1.20 05/12/2014       ASSESSMENT AND PLAN  MULTIPLE SCLEROSIS  Spastic diplegia (HCC)  Weakness  Disturbed cognition  Urinary hesitancy   1.   He will continue ocrelizumab.   We are having some delays getting his authorization (he just turned 67. Medicare as his primary) 2.   Continue other medications. 3.    Try to stay active as possible. 4.    Ativan to help with sleep and night time spasticity 5.   He will return to see me in 6 months or sooner if there are new or worsening neurologic symptoms.  [place] Carmack A. Epimenio Foot, MD, PhD 01/20/2018, 11:33 AM Certified in Neurology, Clinical Neurophysiology, Sleep Medicine, Pain Medicine and Neuroimaging  [facility] [address] [place], [place] [address] [phone]

## 2018-01-27 DIAGNOSIS — G35 Multiple sclerosis: Secondary | ICD-10-CM | POA: Diagnosis not present

## 2018-03-17 DIAGNOSIS — N3000 Acute cystitis without hematuria: Secondary | ICD-10-CM | POA: Diagnosis not present

## 2018-03-18 ENCOUNTER — Telehealth: Payer: Self-pay | Admitting: Neurology

## 2018-03-18 DIAGNOSIS — R531 Weakness: Secondary | ICD-10-CM

## 2018-03-18 DIAGNOSIS — G35 Multiple sclerosis: Secondary | ICD-10-CM

## 2018-03-18 DIAGNOSIS — Z993 Dependence on wheelchair: Secondary | ICD-10-CM

## 2018-03-18 DIAGNOSIS — G8220 Paraplegia, unspecified: Secondary | ICD-10-CM

## 2018-03-18 NOTE — Addendum Note (Signed)
Addended by: Candis Schatz I on: 03/18/2018 04:33 PM   Modules accepted: Orders

## 2018-03-18 NOTE — Telephone Encounter (Signed)
Pts wife called wanting to talk about a plan of care getting put to action. To allow help to come and help the pt with everyday activities.

## 2018-03-18 NOTE — Telephone Encounter (Signed)
Spoke with Verlon Au.  She is frustrated, trying to manage pt's care at home (he is not independent with ADL's, needs help going to the bathroom, with transfers from w/c to toilet, bed, etc.  Wife can no longer safely manage helping him transfer alone. Referral made to [facility] home health for PT, SW, HHA, and will see what they are able to provide.  Pt. is home bound, not able to leave home without assistance/fim

## 2018-03-19 ENCOUNTER — Encounter (HOSPITAL_BASED_OUTPATIENT_CLINIC_OR_DEPARTMENT_OTHER): Payer: Medicare Other | Attending: Internal Medicine

## 2018-03-19 DIAGNOSIS — G35 Multiple sclerosis: Secondary | ICD-10-CM | POA: Insufficient documentation

## 2018-03-19 DIAGNOSIS — G8220 Paraplegia, unspecified: Secondary | ICD-10-CM | POA: Diagnosis not present

## 2018-03-19 DIAGNOSIS — L89322 Pressure ulcer of left buttock, stage 2: Secondary | ICD-10-CM | POA: Diagnosis not present

## 2018-03-19 DIAGNOSIS — Z87891 Personal history of nicotine dependence: Secondary | ICD-10-CM | POA: Insufficient documentation

## 2018-03-19 DIAGNOSIS — L89312 Pressure ulcer of right buttock, stage 2: Secondary | ICD-10-CM | POA: Insufficient documentation

## 2018-03-20 DIAGNOSIS — N3941 Urge incontinence: Secondary | ICD-10-CM | POA: Diagnosis not present

## 2018-03-20 DIAGNOSIS — G35 Multiple sclerosis: Secondary | ICD-10-CM | POA: Diagnosis not present

## 2018-03-24 DIAGNOSIS — N3941 Urge incontinence: Secondary | ICD-10-CM | POA: Diagnosis not present

## 2018-03-24 DIAGNOSIS — G35 Multiple sclerosis: Secondary | ICD-10-CM | POA: Diagnosis not present

## 2018-03-26 ENCOUNTER — Telehealth: Payer: Self-pay | Admitting: Neurology

## 2018-03-26 NOTE — Telephone Encounter (Signed)
Spoke with Verlon Au. Colon Branch sees urology for bladder problems. Sts. recent u/a showed uti, urology didn't want to treat it initially, but then pt. worsened, so they rx'd antibiotics.  He feels weaker. I explained risk of steroids impeding Carson's ability to fight infection, that weakness, increased fatigue is likeliy more a response to the infection than MS exacerbation. She will f/u with urology, continue tx. for UTI. Once infection clears, if Carson's weakness, fatigue do not start to improve, she will call us back/fim

## 2018-03-26 NOTE — Telephone Encounter (Signed)
Pt wife called stating the pt has had a UTI for a little over a week now has been on antibiotics since 3/31. Stating he isnt running a fever but is feeling exteremly weak. Would like a call back to discuss a prescription for steroids

## 2018-03-30 DIAGNOSIS — G35 Multiple sclerosis: Secondary | ICD-10-CM | POA: Diagnosis not present

## 2018-03-30 DIAGNOSIS — N3941 Urge incontinence: Secondary | ICD-10-CM | POA: Diagnosis not present

## 2018-04-01 DIAGNOSIS — N3941 Urge incontinence: Secondary | ICD-10-CM | POA: Diagnosis not present

## 2018-04-01 DIAGNOSIS — G35 Multiple sclerosis: Secondary | ICD-10-CM | POA: Diagnosis not present

## 2018-04-02 ENCOUNTER — Inpatient Hospital Stay (HOSPITAL_COMMUNITY): Payer: Medicare Other

## 2018-04-02 ENCOUNTER — Other Ambulatory Visit: Payer: Self-pay

## 2018-04-02 ENCOUNTER — Encounter (HOSPITAL_COMMUNITY): Payer: Self-pay

## 2018-04-02 ENCOUNTER — Inpatient Hospital Stay (HOSPITAL_COMMUNITY)
Admission: EM | Admit: 2018-04-02 | Discharge: 2018-04-06 | DRG: 059 | Disposition: A | Discharge status: Home / Self Care | Payer: Medicare Other | Attending: Internal Medicine | Admitting: Internal Medicine

## 2018-04-02 DIAGNOSIS — N179 Acute kidney failure, unspecified: Secondary | ICD-10-CM | POA: Diagnosis present

## 2018-04-02 DIAGNOSIS — N400 Enlarged prostate without lower urinary tract symptoms: Secondary | ICD-10-CM | POA: Diagnosis present

## 2018-04-02 DIAGNOSIS — Z993 Dependence on wheelchair: Secondary | ICD-10-CM | POA: Diagnosis not present

## 2018-04-02 DIAGNOSIS — Z79899 Other long term (current) drug therapy: Secondary | ICD-10-CM | POA: Diagnosis not present

## 2018-04-02 DIAGNOSIS — K219 Gastro-esophageal reflux disease without esophagitis: Secondary | ICD-10-CM | POA: Diagnosis present

## 2018-04-02 DIAGNOSIS — G35D Multiple sclerosis, unspecified: Secondary | ICD-10-CM | POA: Diagnosis present

## 2018-04-02 DIAGNOSIS — N390 Urinary tract infection, site not specified: Secondary | ICD-10-CM | POA: Diagnosis not present

## 2018-04-02 DIAGNOSIS — R531 Weakness: Secondary | ICD-10-CM | POA: Diagnosis not present

## 2018-04-02 DIAGNOSIS — G35 Multiple sclerosis: Secondary | ICD-10-CM | POA: Diagnosis present

## 2018-04-02 DIAGNOSIS — J9811 Atelectasis: Secondary | ICD-10-CM | POA: Diagnosis not present

## 2018-04-02 DIAGNOSIS — R404 Transient alteration of awareness: Secondary | ICD-10-CM | POA: Diagnosis not present

## 2018-04-02 LAB — COMPREHENSIVE METABOLIC PANEL
ALBUMIN: 4.3 g/dL (ref 3.5–5.0)
ALT: 40 U/L (ref 17–63)
AST: 33 U/L (ref 15–41)
Alkaline Phosphatase: 102 U/L (ref 38–126)
Anion gap: 10 (ref 5–15)
BILIRUBIN TOTAL: 0.9 mg/dL (ref 0.3–1.2)
BUN: 24 mg/dL — AB (ref 6–20)
CHLORIDE: 108 mmol/L (ref 101–111)
CO2: 25 mmol/L (ref 22–32)
Calcium: 10 mg/dL (ref 8.9–10.3)
Creatinine, Ser: 1.26 mg/dL — ABNORMAL HIGH (ref 0.61–1.24)
GFR calc Af Amer: >60 mL/min (ref >60)
GFR calc non Af Amer: 58 mL/min — ABNORMAL LOW (ref >60)
GLUCOSE: 103 mg/dL — AB (ref 65–99)
POTASSIUM: 4.5 mmol/L (ref 3.5–5.1)
Sodium: 143 mmol/L (ref 135–145)
Total Protein: 7.8 g/dL (ref 6.5–8.1)

## 2018-04-02 LAB — CBC WITH DIFFERENTIAL/PLATELET
BASOS ABS: 0.1 10*3/uL (ref 0.0–0.1)
BASOS PCT: 1 %
Eosinophils Absolute: 0.1 10*3/uL (ref 0.0–0.7)
Eosinophils Relative: 2 %
HEMATOCRIT: 42.6 % (ref 39.0–52.0)
Hemoglobin: 14.1 g/dL (ref 13.0–17.0)
Lymphocytes Relative: 12 %
Lymphs Abs: 0.8 10*3/uL (ref 0.7–4.0)
MCH: 29.7 pg (ref 26.0–34.0)
MCHC: 33.1 g/dL (ref 30.0–36.0)
MCV: 89.7 fL (ref 78.0–100.0)
MONO ABS: 0.9 10*3/uL (ref 0.1–1.0)
Monocytes Relative: 13 %
NEUTROS ABS: 5.1 10*3/uL (ref 1.7–7.7)
Neutrophils Relative %: 72 %
PLATELETS: 275 10*3/uL (ref 150–400)
RBC: 4.75 MIL/uL (ref 4.22–5.81)
RDW: 14 % (ref 11.5–15.5)
WBC: 7.1 10*3/uL (ref 4.0–10.5)

## 2018-04-02 LAB — URINALYSIS, ROUTINE W REFLEX MICROSCOPIC
Bilirubin Urine: NEGATIVE
Glucose, UA: NEGATIVE mg/dL
Hgb urine dipstick: NEGATIVE
KETONES UR: NEGATIVE mg/dL
Nitrite: NEGATIVE
PROTEIN: NEGATIVE mg/dL
SQUAMOUS EPITHELIAL / LPF: NONE SEEN
Specific Gravity, Urine: 1.014 (ref 1.005–1.030)
pH: 7 (ref 5.0–8.0)

## 2018-04-02 LAB — TSH: TSH: 0.682 u[IU]/mL (ref 0.350–4.500)

## 2018-04-02 MED ORDER — ENOXAPARIN SODIUM 40 MG/0.4ML ~~LOC~~ SOLN: 40.0000 mg | SUBCUTANEOUS | Status: DC

## 2018-04-02 MED ORDER — SODIUM CHLORIDE 0.9 % IV SOLN
1000.0000 mg | Freq: Every day | INTRAVENOUS | Status: AC
  Administered 2018-04-03 – 2018-04-06 (×4): 1000 mg via INTRAVENOUS
  Filled 2018-04-02 (×6): qty 8

## 2018-04-02 MED ORDER — TAMSULOSIN HCL 0.4 MG PO CAPS
0.8000 mg | ORAL_CAPSULE | Freq: Every day | ORAL | Status: DC
  Administered 2018-04-02 – 2018-04-05 (×4): 0.8 mg via ORAL
  Filled 2018-04-02 (×4): qty 2

## 2018-04-02 MED ORDER — SODIUM CHLORIDE 0.9 % IV SOLN
1000.0000 mg | Freq: Once | INTRAVENOUS | Status: AC
  Administered 2018-04-02: 1000 mg via INTRAVENOUS
  Filled 2018-04-02: qty 8

## 2018-04-02 MED ORDER — SODIUM CHLORIDE 0.9 % IV SOLN
INTRAVENOUS | Status: DC
  Administered 2018-04-02: 125 mL/h via INTRAVENOUS

## 2018-04-02 MED ORDER — BACLOFEN 10 MG PO TABS
10.0000 mg | ORAL_TABLET | Freq: Three times a day (TID) | ORAL | Status: DC
  Administered 2018-04-02 – 2018-04-06 (×12): 10 mg via ORAL
  Filled 2018-04-02 (×12): qty 1

## 2018-04-02 MED ORDER — PANTOPRAZOLE SODIUM 40 MG PO TBEC
40.0000 mg | DELAYED_RELEASE_TABLET | Freq: Every day | ORAL | Status: DC
  Administered 2018-04-02 – 2018-04-06 (×5): 40 mg via ORAL
  Filled 2018-04-02 (×5): qty 1

## 2018-04-02 MED ORDER — ENOXAPARIN SODIUM 40 MG/0.4ML ~~LOC~~ SOLN
40.0000 mg | SUBCUTANEOUS | Status: DC
Start: 2018-04-02 — End: 2018-04-06
  Administered 2018-04-02 – 2018-04-05 (×4): 40 mg via SUBCUTANEOUS
  Filled 2018-04-02 (×4): qty 0.4

## 2018-04-02 MED ORDER — TRAZODONE HCL 50 MG PO TABS
50.0000 mg | ORAL_TABLET | Freq: Every day | ORAL | Status: DC
  Administered 2018-04-02: 50 mg via ORAL
  Filled 2018-04-02: qty 1

## 2018-04-02 MED ORDER — ENSURE ENLIVE PO LIQD
237.0000 mL | Freq: Every day | ORAL | Status: DC
  Administered 2018-04-02 – 2018-04-06 (×4): 237 mL via ORAL

## 2018-04-02 NOTE — ED Triage Notes (Signed)
patient states, "sometimes I can balance myself, but today I can not." patient also reports that he had a UTI and has been taking antibiotics which he finished 2 days ago.

## 2018-04-02 NOTE — ED Notes (Signed)
ED TO INPATIENT HANDOFF REPORT  Name/Age/Gender Stephen Wade 67 y.o. male  Code Status Code Status History    Date Active Date Inactive Code Status Order ID Comments User Context   11/04/2017 1439 11/06/2017 1729 Full Code [id number]  Donne Hazel, MD ED    Advance Directive Documentation     Most Recent Value  Type of Advance Directive  Healthcare Power of Attorney, Living will  Pre-existing out of facility DNR order (yellow form or pink MOST form)  -  "MOST" Form in Place?  -      Home/SNF/Other    Chief Complaint MS  Level of Care/Admitting Diagnosis ED Disposition    ED Disposition Condition [place]: [facility] [[id number]]  Level of Care: Med-Surg [16]  Diagnosis: Multiple sclerosis ([place]) [340.ICD-9-CM]  Admitting Physician: Elodia Florence [[id number]]  Attending Physician: Cephus Slater, A CALDWELL [phone]  Estimated length of stay: past midnight tomorrow  Certification:: I certify this patient will need inpatient services for at least 2 midnights  PT Class (Do Not Modify): Inpatient [101]  PT Acc Code (Do Not Modify): Private [1]       Medical History Past Medical History:  Diagnosis Date  . GERD (gastroesophageal reflux disease)    had on- off chest pain, symptoms resolved with Prilosec 11/2010  . Lower urinary tract symptoms (LUTS)    Dr Gaynelle Arabian  . MS (multiple sclerosis) ([place]) 2005  . Testicular hypogonadism   . Urge incontinence of urine   . Urolithiasis 2011    Allergies No Known Allergies  IV Location/Drains/Wounds Patient Lines/Drains/Airways Status   Active Line/Drains/Airways    Name:   Placement date:   Placement time:   Site:   Days:   Peripheral IV 04/02/18 Right;Lateral Wrist   04/02/18    0942    Wrist   less than 1   Pressure Injury 11/04/17 Stage I -  Intact skin with non-blanchable redness of a localized area usually over a bony prominence.   11/04/17    1610     149           Labs/Imaging Results for orders placed or performed during the hospital encounter of 04/02/18 (from the past 48 hour(s))  CBC with Differential/Platelet     Status: None   Collection Time: 04/02/18  9:46 AM  Result Value Ref Range   WBC 7.1 4.0 - 10.5 K/uL   RBC 4.75 4.22 - 5.81 MIL/uL   Hemoglobin 14.1 13.0 - 17.0 g/dL   HCT 42.6 39.0 - 52.0 %   MCV 89.7 78.0 - 100.0 fL   MCH 29.7 26.0 - 34.0 pg   MCHC 33.1 30.0 - 36.0 g/dL   RDW 14.0 11.5 - 15.5 %   Platelets 275 150 - 400 K/uL   Neutrophils Relative % 72 %   Neutro Abs 5.1 1.7 - 7.7 K/uL   Lymphocytes Relative 12 %   Lymphs Abs 0.8 0.7 - 4.0 K/uL   Monocytes Relative 13 %   Monocytes Absolute 0.9 0.1 - 1.0 K/uL   Eosinophils Relative 2 %   Eosinophils Absolute 0.1 0.0 - 0.7 K/uL   Basophils Relative 1 %   Basophils Absolute 0.1 0.0 - 0.1 K/uL    Comment: Performed at [facility], [place] [address], [place], [place] [address]  Comprehensive metabolic panel     Status: Abnormal   Collection Time: 04/02/18  9:46 AM  Result Value Ref Range  Sodium 143 135 - 145 mmol/L   Potassium 4.5 3.5 - 5.1 mmol/L   Chloride 108 101 - 111 mmol/L   CO2 25 22 - 32 mmol/L   Glucose, Bld 103 (H) 65 - 99 mg/dL   BUN 24 (H) 6 - 20 mg/dL   Creatinine, Ser 1.26 (H) 0.61 - 1.24 mg/dL   Calcium 10.0 8.9 - 10.3 mg/dL   Total Protein 7.8 6.5 - 8.1 g/dL   Albumin 4.3 3.5 - 5.0 g/dL   AST 33 15 - 41 U/L   ALT 40 17 - 63 U/L   Alkaline Phosphatase 102 38 - 126 U/L   Total Bilirubin 0.9 0.3 - 1.2 mg/dL   GFR calc non Af Amer 58 (L) >60 mL/min   GFR calc Af Amer >60 >60 mL/min    Comment: (NOTE) The eGFR has been calculated using the CKD EPI equation. This calculation has not been validated in all clinical situations. eGFR's persistently <60 mL/min signify possible Chronic Kidney Disease.    Anion gap 10 5 - 15    Comment: Performed at [facility], [place] [address] [place], [place] [address]  Urinalysis,  Routine w reflex microscopic     Status: Abnormal   Collection Time: 04/02/18  9:46 AM  Result Value Ref Range   Color, Urine YELLOW YELLOW   APPearance CLEAR CLEAR   Specific Gravity, Urine 1.014 1.005 - 1.030   pH 7.0 5.0 - 8.0   Glucose, UA NEGATIVE NEGATIVE mg/dL   Hgb urine dipstick NEGATIVE NEGATIVE   Bilirubin Urine NEGATIVE NEGATIVE   Ketones, ur NEGATIVE NEGATIVE mg/dL   Protein, ur NEGATIVE NEGATIVE mg/dL   Nitrite NEGATIVE NEGATIVE   Leukocytes, UA TRACE (A) NEGATIVE   RBC / HPF 0-5 0 - 5 RBC/hpf   WBC, UA 0-5 0 - 5 WBC/hpf   Bacteria, UA RARE (A) NONE SEEN   Squamous Epithelial / LPF NONE SEEN NONE SEEN   Mucus PRESENT     Comment: Performed at [facility], [place] [address] [place], [place] [address]   No results found.  Pending Labs [employer] (From admission, onward)   Start     Ordered   Signed and Held  CBC  (enoxaparin (LOVENOX)    CrCl >/= 30 ml/min)  Once,   R    Comments:  Baseline for enoxaparin therapy IF NOT ALREADY DRAWN.  Notify MD if PLT < 100 K.    Signed and Held   Signed and Held  Creatinine, serum  (enoxaparin (LOVENOX)    CrCl >/= 30 ml/min)  Once,   R    Comments:  Baseline for enoxaparin therapy IF NOT ALREADY DRAWN.    Signed and Held   Signed and Held  Creatinine, serum  (enoxaparin (LOVENOX)    CrCl >/= 30 ml/min)  Weekly,   R    Comments:  while on enoxaparin therapy    Signed and Held   Signed and Held  Comprehensive metabolic panel  Tomorrow morning,   R     Signed and Held   Signed and Held  CBC  Tomorrow morning,   R     Signed and Held   Signed and Held  TSH  Add-on,   R     Signed and Held      Vitals/Pain Today's Vitals   04/02/18 1100 04/02/18 1200 04/02/18 1300 04/02/18 1400  BP: 120/65 133/70 115/85 128/70  Pulse: 71 74 78 75  Resp:  Temp:      TempSrc:      SpO2: 97% 98% 98% 97%  Weight:      Height:      PainSc:        Isolation Precautions No active  isolations  Medications Medications  0.9 %  sodium chloride infusion (125 mL/hr Intravenous New Bag/Given 04/02/18 1411)  methylPREDNISolone sodium succinate (SOLU-MEDROL) 1,000 mg in sodium chloride 0.9 % 50 mL IVPB (0 mg Intravenous Stopped 04/02/18 1346)    Mobility non-ambulatory

## 2018-04-02 NOTE — H&P (Signed)
History and Physical    Stephen Wade ZOX:[id number] DOB: Sep 27, 1951 DOA: 04/02/2018  PCP: Wanda Plump, MD  Patient coming from: home  I have personally briefly reviewed patient's old medical records in [facility]  Chief Complaint: weakness  HPI: Stephen Wade is Stephen Wade 67 y.o. male with medical history significant of multiple sclerosis, reflux, BPH presenting with worsening right-sided weakness and leaning to the right side concerning for an MS flare.  He notes that at baseline he uses Stephen Wade.  He is typically able to transfer.  Yesterday he felt weaker and unable to transfer.  Today he noticed that he was leaning more to the right.  He also noticed right-sided weakness with it which is characteristics of his flares of MS.  He denies other symptoms.  Denies lightheadedness, vertigo, chest pain, shortness of breath.  He has had multiple sclerosis since 2005.  He was last admitted in November 2018.  He typically has 1-2 flares Stephen Wade year.  He denies any swelling, abdominal pain, numbness or tingling.  He notes that sometimes the symptoms occur with Stephen Wade urinary tract infection.  He was recently treated for UTI within the past few days.  He denies any current UTI symptoms  ED Course: Labs, solumedrol.  Neuro c/s.  Hospitalist to admit for concern for MS flare.  Review of Systems: As per HPI otherwise 10 point review of systems negative.   Past Medical History:  Diagnosis Date  . GERD (gastroesophageal reflux disease)    had on- off chest pain, symptoms resolved with Prilosec 11/2010  . Lower urinary tract symptoms (LUTS)    Dr Patsi Sears  . MS (multiple sclerosis) ([facility]) 2005  . Testicular hypogonadism   . Urge incontinence of urine   . Urolithiasis 2011    Past Surgical History:  Procedure Laterality Date  . APPENDECTOMY      ruptured 03-2010  . VASECTOMY       reports that he has never smoked. He has never used smokeless tobacco. He reports that he does not drink alcohol or  use drugs.  No Known Allergies  Family History  Problem Relation Age of Onset  . Heart attack Mother   . Brain cancer Father     Prior to Admission medications   Medication Sig Start Date End Date Taking? Authorizing Provider  baclofen (LIORESAL) 10 MG tablet Take 1 tablet (10 mg total) by mouth 3 (three) times daily. 12/24/17  Yes Sater, Pearletha Furl, MD  ocrelizumab (OCREVUS) 300 MG/10ML injection Inject 300 mg every 6 (six) months into the vein.   Yes [provider]  omeprazole (PRILOSEC) 20 MG capsule Take 20 mg daily as needed by mouth (for acid reflex).    Yes [provider]  tamsulosin (FLOMAX) 0.4 MG CAPS capsule Take 0.8 mg by mouth at bedtime. 02/26/16  Yes [provider]  ENSURE (ENSURE) Take 237 mLs daily by mouth.    [provider]  LORazepam (ATIVAN) 0.5 MG tablet Take one or two po qHS Patient not taking: Reported on 04/02/2018 01/20/18   Asa Lente, MD    Physical Exam: Vitals:   04/02/18 1200 04/02/18 1300 04/02/18 1400 04/02/18 1512  BP: 133/70 115/85 128/70 132/70  Pulse: 74 78 75 78  Resp:    18  Temp:    98.1 F (36.7 C)  TempSrc:    Oral  SpO2: 98% 98% 97% 92%  Weight:      Height:  Constitutional: NAD, calm, comfortable Vitals:   04/02/18 1200 04/02/18 1300 04/02/18 1400 04/02/18 1512  BP: 133/70 115/85 128/70 132/70  Pulse: 74 78 75 78  Resp:    18  Temp:    98.1 F (36.7 C)  TempSrc:    Oral  SpO2: 98% 98% 97% 92%  Weight:      Height:       Eyes: PERRL, lids and conjunctivae normal ENMT: Mucous membranes are moist. Posterior pharynx clear of any exudate or lesions.Normal dentition.  Neck: normal, supple, no masses, no thyromegaly Respiratory: clear to auscultation bilaterally, no wheezing, no crackles. Normal respiratory effort. No accessory muscle use.  Cardiovascular: Regular rate and rhythm, no murmurs / rubs / gallops. No extremity edema. 2+ pedal pulses. No carotid bruits.  Abdomen: no  tenderness, no masses palpated. No hepatosplenomegaly. Bowel sounds positive.  Musculoskeletal: no clubbing / cyanosis. No joint deformity upper and lower extremities. Good ROM, no contractures. Normal muscle tone.  Skin: no rashes, lesions, ulcers. No induration Neurologic: CN 2-12 intact. Sensation intact. Strength 5/5 to LUE, 4/5 to RUE.  3/5 in LLE (chronic).  2/5 in RLE (chronic) Psychiatric: Normal judgment and insight. Alert and oriented x 3. Normal mood.   Labs on Admission: I have personally reviewed following labs and imaging studies  CBC: Recent Labs  Lab 04/02/18 0946  WBC 7.1  NEUTROABS 5.1  HGB 14.1  HCT 42.6  MCV 89.7  PLT 275   Basic Metabolic Panel: Recent Labs  Lab 04/02/18 0946  NA 143  K 4.5  CL 108  CO2 25  GLUCOSE 103*  BUN 24*  CREATININE 1.26*  CALCIUM 10.0   GFR: Estimated Creatinine Clearance: 64.2 mL/min (Tia[place]na Buford) (by C-G formula based on SCr of 1.26 mg/dL (H)). Liver Function Tests: Recent Labs  Lab 04/02/18 0946  AST 33  ALT 40  ALKPHOS 102  BILITOT 0.9  PROT 7.8  ALBUMIN 4.3   No results for input(s): LIPASE, AMYLASE in the last 168 hours. No results for input(s): AMMONIA in the last 168 hours. Coagulation Profile: No results for input(s): INR, PROTIME in the last 168 hours. Cardiac Enzymes: No results for input(s): CKTOTAL, CKMB, CKMBINDEX, TROPONINI in the last 168 hours. BNP (last 3 results) No results for input(s): PROBNP in the last 8760 hours. HbA1C: No results for input(s): HGBA1C in the last 72 hours. CBG: No results for input(s): GLUCAP in the last 168 hours. Lipid Profile: No results for input(s): CHOL, HDL, LDLCALC, TRIG, CHOLHDL, LDLDIRECT in the last 72 hours. Thyroid Function Tests: Recent Labs    04/02/18 1548  TSH 0.682   Anemia Panel: No results for input(s): VITAMINB12, FOLATE, FERRITIN, TIBC, IRON, RETICCTPCT in the last 72 hours. Urine analysis:    Component Value Date/Time   COLORURINE YELLOW 04/02/2018  0946   APPEARANCEUR CLEAR 04/02/2018 0946   LABSPEC 1.014 04/02/2018 0946   PHURINE 7.0 04/02/2018 0946   GLUCOSEU NEGATIVE 04/02/2018 0946   GLUCOSEU NEGATIVE 03/11/2013 1106   HGBUR NEGATIVE 04/02/2018 0946   HGBUR large 10/05/2010 1242   BILIRUBINUR NEGATIVE 04/02/2018 0946   BILIRUBINUR neg 03/11/2013 1039   KETONESUR NEGATIVE 04/02/2018 0946   PROTEINUR NEGATIVE 04/02/2018 0946   UROBILINOGEN 0.2 03/17/2013 1005   NITRITE NEGATIVE 04/02/2018 0946   LEUKOCYTESUR TRACE (Stephen Wade) 04/02/2018 0946    Radiological Exams on Admission: No results found.  EKG: Independently reviewed. none  Assessment/Plan Active Problems:   MULTIPLE SCLEROSIS  Multiple Sclerosis Exacerbation:  With R sided weakness c/w prior  MS flares, leaning to R which is Stephen Wade bit different.   UA not c/w UTI CXR pending No noted infectious sx.  He notes he just finished Diella Gillingham course of abx for UTI. Neuro c/s, appreciate recs.  Holding off on imaging for now. Solumedrol 1000 mg x 5 days Pt taking ocrevus Continue baclofen  Acute Kidney Injury:  Baseline creatinine 0.85, increased to 1.26 here.  Follow with IVF.  UA bland.  BPH:  Self caths. Flomax.    GERD: continue PPI  DVT prophylaxis: lovenox  Code Status: full  Family Communication: none at bedside  Disposition Plan: plan for several days of IV steroids for MS flare  Consults called: neurology  Admission status: inpatient    Lacretia Nicks MD [facility] Pager [phone]  If 7PM-7AM, please contact night-coverage [email or web address] Password [facility]  04/02/2018, 5:15 PM

## 2018-04-02 NOTE — ED Provider Notes (Signed)
[facility]-EMERGENCY DEPT Provider Note   CSN: [id number] Arrival date & time: 04/02/18  0841     History   Chief Complaint Chief Complaint  Patient presents with  . Weakness    HPI Stephen Wade is a 67 y.o. male.  Patient has a history of MS.  Patient states yesterday started getting progressively weaker and Falling to the right side.  This is happened before.  The history is provided by the patient. No language interpreter was used.  Weakness  Primary symptoms include loss of balance. This is a recurrent problem. The current episode started 12 to 24 hours ago. The problem has not changed since onset.There was no focality noted. There has been no fever (No fever). Pertinent negatives include no chest pain and no headaches. There were no medications administered prior to arrival. Associated medical issues do not include trauma.    Past Medical History:  Diagnosis Date  . GERD (gastroesophageal reflux disease)    had on- off chest pain, symptoms resolved with Prilosec 11/2010  . Lower urinary tract symptoms (LUTS)    Dr Patsi Sears  . MS (multiple sclerosis) ([facility]) 2005  . Testicular hypogonadism   . Urge incontinence of urine   . Urolithiasis 2011    Patient Active Problem List   Diagnosis Date Noted  . Urinary hesitancy 01/20/2018  . Pressure injury of skin 11/06/2017  . Acute lower UTI 11/04/2017  . Multiple sclerosis exacerbation ([facility]) 11/04/2017  . Skin ulcer of sacral region ([facility]CC) 05/26/2017  . Vitamin D deficiency 01/16/2016  . Spastic gait 02/10/2015  . Spastic diplegia ([facility]CC) 02/10/2015  . Weakness 02/10/2015  . Disturbed cognition 02/10/2015  . Edema 05/12/2014  . Pressure ulcer of coccygeal region, stage 1 08/24/2013  . Hypogonadism male 03/11/2013  . Lower urinary tract symptoms (LUTS) 12/26/2011  . GERD 11/30/2010  . URINARY CALCULUS 10/05/2010  . CHEST PAIN 10/05/2010  . MULTIPLE SCLEROSIS 07/24/2007    Past Surgical History:   Procedure Laterality Date  . APPENDECTOMY      ruptured 03-2010  . VASECTOMY          Home Medications    Prior to Admission medications   Medication Sig Start Date End Date Taking? Authorizing Provider  baclofen (LIORESAL) 10 MG tablet Take 1 tablet (10 mg total) by mouth 3 (three) times daily. 12/24/17  Yes Sater, Pearletha Furl, MD  ocrelizumab (OCREVUS) 300 MG/10ML injection Inject 300 mg every 6 (six) months into the vein.   Yes [provider]  omeprazole (PRILOSEC) 20 MG capsule Take 20 mg daily as needed by mouth (for acid reflex).    Yes [provider]  tamsulosin (FLOMAX) 0.4 MG CAPS capsule Take 0.8 mg by mouth at bedtime. 02/26/16  Yes [provider]  ENSURE (ENSURE) Take 237 mLs daily by mouth.    [provider]  LORazepam (ATIVAN) 0.5 MG tablet Take one or two po qHS Patient not taking: Reported on 04/02/2018 01/20/18   Asa Lente, MD    Family History Family History  Problem Relation Age of Onset  . Heart attack Mother   . Brain cancer Father     Social History Social History   Tobacco Use  . Smoking status: Never Smoker  . Smokeless tobacco: Never Used  Substance Use Topics  . Alcohol use: No    Alcohol/week: 0.0 oz    Comment: rare  . Drug use: No     Allergies   Patient has no  known allergies.   Review of Systems Review of Systems  Constitutional: Negative for appetite change and fatigue.  HENT: Negative for congestion, ear discharge and sinus pressure.   Eyes: Negative for discharge.  Respiratory: Negative for cough.   Cardiovascular: Negative for chest pain.  Gastrointestinal: Negative for abdominal pain and diarrhea.  Genitourinary: Negative for frequency and hematuria.  Musculoskeletal: Negative for back pain.  Skin: Negative for rash.  Neurological: Positive for weakness and loss of balance. Negative for seizures and headaches.  Psychiatric/Behavioral: Negative for hallucinations.     Physical  Exam Updated Vital Signs BP 133/70   Pulse 74   Temp (!) 97.5 F (36.4 C) (Oral)   Resp 16   Ht 6' (1.829 m)   Wt 90.7 kg (200 lb)   SpO2 98%   BMI 27.12 kg/m   Physical Exam  Constitutional: He is oriented to person, place, and time. He appears well-developed.  HENT:  Head: Normocephalic.  Eyes: Conjunctivae and EOM are normal. No scleral icterus.  Neck: Neck supple. No thyromegaly present.  Cardiovascular: Normal rate and regular rhythm. Exam reveals no gallop and no friction rub.  No murmur heard. Pulmonary/Chest: No stridor. He has no wheezes. He has no rales. He exhibits no tenderness.  Abdominal: He exhibits no distension. There is no tenderness. There is no rebound.  Musculoskeletal: Normal range of motion. He exhibits no edema.  Lymphadenopathy:    He has no cervical adenopathy.  Neurological: He is oriented to person, place, and time.  Patient with profound weakness in lower extremities  Skin: No rash noted. No erythema.  Psychiatric: He has a normal mood and affect. His behavior is normal.     ED Treatments / Results  Labs (all labs ordered are listed, but only abnormal results are displayed) Labs Reviewed  COMPREHENSIVE METABOLIC PANEL - Abnormal; Notable for the following components:      Result Value   Glucose, Bld 103 (*)    BUN 24 (*)    Creatinine, Ser 1.26 (*)    GFR calc non Af Amer 58 (*)    All other components within normal limits  URINALYSIS, ROUTINE W REFLEX MICROSCOPIC - Abnormal; Notable for the following components:   Leukocytes, UA TRACE (*)    Bacteria, UA RARE (*)    All other components within normal limits  CBC WITH DIFFERENTIAL/PLATELET    EKG None  Radiology No results found.  Procedures Procedures (including critical care time)  Medications Ordered in ED Medications  methylPREDNISolone sodium succinate (SOLU-MEDROL) 1,000 mg in sodium chloride 0.9 % 50 mL IVPB (1,000 mg Intravenous New Bag/Given 04/02/18 1242)      Initial Impression / Assessment and Plan / ED Course  I have reviewed the triage vital signs and the nursing notes.  Pertinent labs & imaging results that were available during my care of the patient were reviewed by me and considered in my medical decision making (see chart for details).     Patient with profound weakness most likely exacerbation of his MS.  I spoke with neurology and they agree with admission to the hospital with high-dose steroids.  [facility] will [facility]  Final Clinical Impressions(s) / ED Diagnoses   Final diagnoses:  Weakness    ED Discharge Orders    None       Bethann Berkshire, MD 04/02/18 1322

## 2018-04-02 NOTE — ED Triage Notes (Signed)
Per EMS- patient has a history of MS x 5 years and the patient's wife reports that it has been very progressive. Patient is normally non ambulatory, but today is much weaker than normal. Patient refused to have CBG obtained by EMS.

## 2018-04-02 NOTE — Consult Note (Signed)
NEURO HOSPITALIST CONSULT NOTE   Requestig physician: Dr. Lowell Guitar  Reason for Consult: MS exacerbation  History obtained from:  Patient and Chart    HPI:                                                                                                                                          Stephen Wade is an 67 y.o. male with a 5 year history of multiple sclerosis who presents with new onset of loss of balance with "leaning to the right", beginning yesterday. He states "sometimes I can balance myself, but today I can not."  He endorsed progressive weakness throughout the day yesterday, falling towards his right side. He also endorses having had a UTI recently with antibiotic regimen completed 2 days ago.   At baseline he is unable to walk. He uses a scooter and wheelchair for ambulation. He is being treated for his MS with Ocrevus (ocrelizumab) infusion q50months. His last infusion was 2 months ago. Dr. Epimenio Foot follows him for his MS.   Past Medical History:  Diagnosis Date  . GERD (gastroesophageal reflux disease)    had on- off chest pain, symptoms resolved with Prilosec 11/2010  . Lower urinary tract symptoms (LUTS)    Dr Patsi Sears  . MS (multiple sclerosis) (HCC) 2005  . Testicular hypogonadism   . Urge incontinence of urine   . Urolithiasis 2011    Past Surgical History:  Procedure Laterality Date  . APPENDECTOMY      ruptured 03-2010  . VASECTOMY      Family History  Problem Relation Age of Onset  . Heart attack Mother   . Brain cancer Father               Social History:  reports that he has never smoked. He has never used smokeless tobacco. He reports that he does not drink alcohol or use drugs.  No Known Allergies  MEDICATIONS:                                                                                                                     Prior to Admission:  Medications Prior to Admission  Medication Sig Dispense Refill Last Dose  .  baclofen (LIORESAL) 10 MG tablet Take 1 tablet (10 mg  total) by mouth 3 (three) times daily. 270 tablet 2 04/01/2018 at Unknown time  . ocrelizumab (OCREVUS) 300 MG/10ML injection Inject 300 mg every 6 (six) months into the vein.   More than 30 days  . omeprazole (PRILOSEC) 20 MG capsule Take 20 mg daily as needed by mouth (for acid reflex).    04/01/2018 at Unknown time  . tamsulosin (FLOMAX) 0.4 MG CAPS capsule Take 0.8 mg by mouth at bedtime.  5 04/01/2018 at Unknown time  . ENSURE (ENSURE) Take 237 mLs daily by mouth.   Taking  . LORazepam (ATIVAN) 0.5 MG tablet Take one or two po qHS (Patient not taking: Reported on 04/02/2018) 60 tablet 5 Not Taking at Unknown time     ROS:                                                                                                                                       As per HPI   Blood pressure 133/70, pulse 74, temperature (!) 97.5 F (36.4 C), temperature source Oral, resp. rate 16, height 6' (1.829 m), weight 90.7 kg (200 lb), SpO2 98 %.   General Examination:                                                                                                       Physical Exam  HEENT-  [place]/AT  Lungs- Respirations unlabored   Extremities- Warm and well perfused   Neurological Examination Mental Status: Alert, fully oriented, thought content appropriate.  Speech is ataxic and dysarthric, but fluent with intact comprehension. Cranial Nerves: II: Visual fields intact bilaterally. PERRL.   III,IV, VI: Left ptosis. EOMI without nystagmus.  V,VII: Mild decreased NL fold on the left. Facial temp sensation normal bilaterally. VIII: hearing intact to voice IX,X: uvula midline XI: Symmetric shoulder shrug XII: midline tongue extension Motor: Decreased tone upper extremities. Mild decreased bulk of hand intrinsics bilaterally. Increased tone lower extremities bilaterally.  RUE: 4/5 proximal and distal except for 4-/5 right triceps.  RLE: 2/5 hip  flexion, 4-/5 knee extension.  LUE: 4+/5 proximal with 4/5 grip strength. LLE: 3/5 hip flexion, 4/5 knee extension.  Sensory: Temp and light touch intact x 4 Deep Tendon Reflexes:  1+ bilateral brachioradialis and biceps, but briskly so.  3+ low amplitude patellar reflexes. Unable to elicit achilles reflexes.  Plantars: Upgoing toes bilaterally Cerebellar: Prominent ataxia with FNF RUE. Slow left FNF with mild dysmetria. Unable to test lower extremities due to weakness.  Gait:  Unable to assess.    Lab Results: Basic Metabolic Panel: Recent Labs  Lab 04/02/18 0946  NA 143  K 4.5  CL 108  CO2 25  GLUCOSE 103*  BUN 24*  CREATININE 1.26*  CALCIUM 10.0    CBC: Recent Labs  Lab 04/02/18 0946  WBC 7.1  NEUTROABS 5.1  HGB 14.1  HCT 42.6  MCV 89.7  PLT 275    Cardiac Enzymes: No results for input(s): CKTOTAL, CKMB, CKMBINDEX, TROPONINI in the last 168 hours.  Lipid Panel: No results for input(s): CHOL, TRIG, HDL, CHOLHDL, VLDL, LDLCALC in the last 168 hours.  Imaging: No results found.  Assessment: 67 year old male with MS exacerbation 1. Multiple findings on exam are c/w MS 2. Elevated BUN/Cr ratio, suggestive of volume depletion 3. Recently treated UTI  Recommendations: 1. IV Solumedrol 1000 mg qd x 5 days 2. Monitor electrolytes and CBC qd 3. MRI brain with and without contrast 4. Outpatient follow up with Dr. Epimenio Foot   Electronically signed: Dr. Caryl Pina 04/02/2018, 1:41 PM

## 2018-04-03 LAB — COMPREHENSIVE METABOLIC PANEL
ALBUMIN: 3.5 g/dL (ref 3.5–5.0)
ALT: 33 U/L (ref 17–63)
ANION GAP: 11 (ref 5–15)
AST: 27 U/L (ref 15–41)
Alkaline Phosphatase: 90 U/L (ref 38–126)
BUN: 26 mg/dL — ABNORMAL HIGH (ref 6–20)
CO2: 21 mmol/L — AB (ref 22–32)
Calcium: 9.2 mg/dL (ref 8.9–10.3)
Chloride: 110 mmol/L (ref 101–111)
Creatinine, Ser: 1.14 mg/dL (ref 0.61–1.24)
GFR calc non Af Amer: >60 mL/min (ref >60)
GLUCOSE: 156 mg/dL — AB (ref 65–99)
POTASSIUM: 4.3 mmol/L (ref 3.5–5.1)
SODIUM: 142 mmol/L (ref 135–145)
TOTAL PROTEIN: 6.6 g/dL (ref 6.5–8.1)
Total Bilirubin: 0.4 mg/dL (ref 0.3–1.2)

## 2018-04-03 LAB — CBC
HEMATOCRIT: 40.8 % (ref 39.0–52.0)
HEMOGLOBIN: 13.4 g/dL (ref 13.0–17.0)
MCH: 29.1 pg (ref 26.0–34.0)
MCHC: 32.8 g/dL (ref 30.0–36.0)
MCV: 88.5 fL (ref 78.0–100.0)
Platelets: 287 10*3/uL (ref 150–400)
RBC: 4.61 MIL/uL (ref 4.22–5.81)
RDW: 13.6 % (ref 11.5–15.5)
WBC: 9 10*3/uL (ref 4.0–10.5)

## 2018-04-03 LAB — MAGNESIUM: Magnesium: 1.6 mg/dL — ABNORMAL LOW (ref 1.7–2.4)

## 2018-04-03 MED ORDER — TRAZODONE HCL 50 MG PO TABS
100.0000 mg | ORAL_TABLET | Freq: Every day | ORAL | Status: DC
  Administered 2018-04-03 – 2018-04-05 (×3): 100 mg via ORAL
  Filled 2018-04-03 (×3): qty 2

## 2018-04-03 MED ORDER — MAGNESIUM SULFATE 2 GM/50ML IV SOLN
2.0000 g | Freq: Once | INTRAVENOUS | Status: AC
  Administered 2018-04-03: 2 g via INTRAVENOUS
  Filled 2018-04-03: qty 50

## 2018-04-03 NOTE — Progress Notes (Addendum)
PROGRESS NOTE    TEIGEN BELLIN  ZOX:[id number] DOB: 06-10-1951 DOA: 04/02/2018 PCP: No primary care provider on file.     Brief Narrative:  Stephen Wade is a 67 y.o. male with medical history significant of multiple sclerosis, reflux, BPH presenting with worsening right-sided weakness and leaning to the right side concerning for an MS flare. He notes that at baseline he uses a power wheelchair.  He is typically able to transfer.  Yesterday he felt weaker and unable to transfer.  Today he noticed that he was leaning more to the right.  He also noticed right-sided weakness with it which is characteristic of his flares of MS.  He has had multiple sclerosis since 2005.  He was last admitted in November 2018.  He typically has 1-2 flares a year.  Neurology was consulted, they recommended MRI of the brain as well as starting Solu-Medrol IV times 5 days  Assessment & Plan:   Active Problems:   MULTIPLE SCLEROSIS   Multiple sclerosis exacerbation -Neurology consulted -Solu-Medrol 1 g x 5 days -PT OT consulted -MRI brain with and without contrast ordered - patient declined today  -Outpatient follow up with Dr. Epimenio Foot  Acute kidney injury -Resolved  Hypomagnesemia -Replace, trend  BPH -Self caths at home -Continue Flomax  GERD -Continue PPI   DVT prophylaxis: Lovenox Code Status: Full code Family Communication: No family at bedside Disposition Plan: Pending improvement and IV Solu-Medrol for 5 days   Consultants:   Neurology  Procedures:   None  Antimicrobials:  Anti-infectives (From admission, onward)   None       Subjective: Without any new complaints, continues to be weak on the right side.  Has not been out of bed yet today.  Objective: Vitals:   04/02/18 1400 04/02/18 1512 04/02/18 2132 04/03/18 0546  BP: 128/70 132/70 137/61 122/65  Pulse: 75 78 (!) 106 90  Resp:  18 18 18   Temp:  98.1 F (36.7 C) 97.7 F (36.5 C) 97.6 F (36.4 C)  TempSrc:  Oral  Oral Oral  SpO2: 97% 92% 96% 96%  Weight:      Height:        Intake/Output Summary (Last 24 hours) at 04/03/2018 1246 Last data filed at 04/03/2018 0840 Gross per 24 hour  Intake 1602.08 ml  Output 1675 ml  Net -72.92 ml   Filed Weights   04/02/18 0857  Weight: 90.7 kg (200 lb)    Examination:  General exam: Appears calm and comfortable  Respiratory system: Clear to auscultation. Respiratory effort normal. Cardiovascular system: S1 & S2 heard, RRR. No JVD, murmurs, rubs, gallops or clicks. No pedal edema. Gastrointestinal system: Abdomen is nondistended, soft and nontender. No organomegaly or masses felt. Normal bowel sounds heard. Central nervous system: Alert and oriented. RLE weaker compared to LLE  Extremities: Symmetric in appearance  Skin: No rashes, lesions or ulcers on exposed skin  Psychiatry: Judgement and insight appear normal. Mood & affect appropriate.   Data Reviewed: I have personally reviewed following labs and imaging studies  CBC: Recent Labs  Lab 04/02/18 0946 04/03/18 0407  WBC 7.1 9.0  NEUTROABS 5.1  --   HGB 14.1 13.4  HCT 42.6 40.8  MCV 89.7 88.5  PLT 275 287   Basic Metabolic Panel: Recent Labs  Lab 04/02/18 0946 04/03/18 0407  NA 143 142  K 4.5 4.3  CL 108 110  CO2 25 21*  GLUCOSE 103* 156*  BUN 24* 26*  CREATININE 1.26* 1.14  CALCIUM 10.0 9.2  MG  --  1.6*   GFR: Estimated Creatinine Clearance: 70.9 mL/min (by C-G formula based on SCr of 1.14 mg/dL). Liver Function Tests: Recent Labs  Lab 04/02/18 0946 04/03/18 0407  AST 33 27  ALT 40 33  ALKPHOS 102 90  BILITOT 0.9 0.4  PROT 7.8 6.6  ALBUMIN 4.3 3.5   No results for input(s): LIPASE, AMYLASE in the last 168 hours. No results for input(s): AMMONIA in the last 168 hours. Coagulation Profile: No results for input(s): INR, PROTIME in the last 168 hours. Cardiac Enzymes: No results for input(s): CKTOTAL, CKMB, CKMBINDEX, TROPONINI in the last 168 hours. BNP (last 3  results) No results for input(s): PROBNP in the last 8760 hours. HbA1C: No results for input(s): HGBA1C in the last 72 hours. CBG: No results for input(s): GLUCAP in the last 168 hours. Lipid Profile: No results for input(s): CHOL, HDL, LDLCALC, TRIG, CHOLHDL, LDLDIRECT in the last 72 hours. Thyroid Function Tests: Recent Labs    04/02/18 1548  TSH 0.682   Anemia Panel: No results for input(s): VITAMINB12, FOLATE, FERRITIN, TIBC, IRON, RETICCTPCT in the last 72 hours. Sepsis Labs: No results for input(s): PROCALCITON, LATICACIDVEN in the last 168 hours.  No results found for this or any previous visit (from the past 240 hour(s)).     Radiology Studies: Dg Chest Port 1 View  Result Date: 04/02/2018 CLINICAL DATA:  History of MS EXAM: PORTABLE CHEST 1 VIEW COMPARISON:  04/03/2010 FINDINGS: Minimal basilar atelectasis. Borderline heart size. No consolidation or effusion. No pneumothorax. IMPRESSION: No active disease.  Minimal basilar atelectasis. Electronically Signed   By: Jasmine Pang M.D.   On: 04/02/2018 19:24      Scheduled Meds: . baclofen  10 mg Oral TID  . enoxaparin (LOVENOX) injection  40 mg Subcutaneous Q24H  . feeding supplement (ENSURE ENLIVE)  237 mL Oral Daily  . pantoprazole  40 mg Oral Daily  . tamsulosin  0.8 mg Oral QHS  . traZODone  50 mg Oral QHS   Continuous Infusions: . methylPREDNISolone (SOLU-MEDROL) injection       LOS: 1 day    Time spent: 20 minutes   Noralee Stain, DO [facility] [email or web address] Password [facility] 04/03/2018, 12:46 PM

## 2018-04-03 NOTE — Progress Notes (Signed)
PT Cancellation Note  Patient Details Name: Stephen Wade MRN: [id number] DOB: 03/07/1951   Cancelled Treatment:    Reason Eval/Treat Not Completed: Patient declined, patient request to start tomorrow, getting medication for MS exacerbation.  [name] 04/03/2018, 12:22 PM  Blanchard Kelch PT [phone]

## 2018-04-04 LAB — BASIC METABOLIC PANEL
Anion gap: 11 (ref 5–15)
BUN: 27 mg/dL — AB (ref 6–20)
CHLORIDE: 112 mmol/L — AB (ref 101–111)
CO2: 21 mmol/L — ABNORMAL LOW (ref 22–32)
Calcium: 9.1 mg/dL (ref 8.9–10.3)
Creatinine, Ser: 0.92 mg/dL (ref 0.61–1.24)
GFR calc Af Amer: >60 mL/min (ref >60)
Glucose, Bld: 156 mg/dL — ABNORMAL HIGH (ref 65–99)
POTASSIUM: 4.3 mmol/L (ref 3.5–5.1)
SODIUM: 144 mmol/L (ref 135–145)

## 2018-04-04 LAB — MAGNESIUM: MAGNESIUM: 2 mg/dL (ref 1.7–2.4)

## 2018-04-04 NOTE — Evaluation (Signed)
Physical Therapy Evaluation Patient Details Name: Stephen Wade MRN: [id number] DOB: 28-Feb-1951 Today's Date: 04/04/2018   History of Present Illness  This 67 year old man was admitted with a MS exacerbation  Clinical Impression  The patient requires extensive assistance of 2 for mobility to sitting. Stood at [employer]. Unable to safely stand at [facility]. Pt admitted with above diagnosis. Pt currently with functional limitations due to the deficits listed below (see PT Problem List).  Pt will benefit from skilled PT to increase their independence and safety with mobility to allow discharge to the venue listed below.      Follow Up Recommendations SNF;Home health PT    Equipment Recommendations  (STEDY lift equipment)    Recommendations for Other Services       Precautions / Restrictions Precautions Precautions: Fall Precaution Comments: has extensor spasms in legs at time Restrictions Weight Bearing Restrictions: No      Mobility  Bed Mobility Overal bed mobility: Needs Assistance Bed Mobility: Supine to Sit;Sit to Supine Rolling: (mod to R; max/total to L)   Supine to sit: Max assist;+2 for physical assistance Sit to supine: Max assist;+2 for physical assistance   General bed mobility comments: assist for legs and trunk.  Sleeps in a lazy boy lift chair  Transfers Overall transfer level: Needs assistance Equipment used: (sara stedy) Transfers: Sit to/from Stand Sit to Stand: Max assist;Mod assist;+2 physical assistance(depending upon surface height)         General transfer comment: 5 trials.  Pt initially wanted to try from low bed surface. Unable to stand completely with max +2 assistance. When bed raised, mod +2.  Had to use sara stedy as it was unsafe to try with RW due to knees buckling and pts need to pull up  Ambulation/Gait                Stairs            Wheelchair Mobility    Modified Rankin (Stroke Patients Only)       Balance  Overall balance assessment: History of Falls;Needs assistance Sitting-balance support: Bilateral upper extremity supported Sitting balance-Leahy Scale: Poor Sitting balance - Comments: reliant on bil UEs; loss of balance posteriorly in sitting                                     Pertinent Vitals/Pain Pain Assessment: No/denies pain    Home Living Family/patient expects to be discharged to:: Private residence Living Arrangements: Spouse/significant other Available Help at Discharge: Family   Home Access: Ramped entrance       Home Equipment: Electric scooter;Hospital bed Additional Comments: toilet is in a little closet.  Sleeps in lazyboy lift chair, reports multiple calls, Fire department has to assist    Prior Function           Comments: wife assists as needed     Hand Dominance   Dominant Hand: (ambidexterous)    Extremity/Trunk Assessment   Upper Extremity Assessment Upper Extremity Assessment: Defer to OT evaluation    Lower Extremity Assessment Lower Extremity Assessment: RLE deficits/detail;LLE deficits/detail RLE Deficits / Details: minimal dorsiflexion, flexes hip  about 20 *, unable to SLR, noted increased tom=ne into extension LLE Deficits / Details: flexes hip more than on right~40 degrees, minimal dorsiflexion., unable to SLR, bears weight when assisted to stand    Cervical / Trunk Assessment Cervical / Trunk  Assessment: Kyphotic  Communication   Communication: (dysarthric)  Cognition Arousal/Alertness: Awake/alert Behavior During Therapy: WFL for tasks assessed/performed Overall Cognitive Status: Within Functional Limits for tasks assessed                                        General Comments      Exercises     Assessment/Plan    PT Assessment Patient needs continued PT services  PT Problem List Decreased strength       PT Treatment Interventions DME instruction;Functional mobility  training;Patient/family education;Therapeutic activities    PT Goals (Current goals can be found in the Care Plan section)  Acute Rehab PT Goals Patient Stated Goal: get back to baseline functioning PT Goal Formulation: With patient Time For Goal Achievement: 04/18/18 Potential to Achieve Goals: Fair    Frequency Min 2X/week   Barriers to discharge Decreased caregiver support      Co-evaluation PT/OT/SLP Co-Evaluation/Treatment: Yes Reason for Co-Treatment: Complexity of the patient's impairments (multi-system involvement);For patient/therapist safety PT goals addressed during session: Mobility/safety with mobility OT goals addressed during session: ADL's and self-care       AM-PAC PT "6 Clicks" Daily Activity  Outcome Measure Difficulty turning over in bed (including adjusting bedclothes, sheets and blankets)?: Unable Difficulty moving from lying on back to sitting on the side of the bed? : Unable Difficulty sitting down on and standing up from a chair with arms (e.g., wheelchair, bedside commode, etc,.)?: Unable Help needed moving to and from a bed to chair (including a wheelchair)?: Total Help needed walking in hospital room?: Total Help needed climbing 3-5 steps with a railing? : Total 6 Click Score: 6    End of Session Equipment Utilized During Treatment: Gait belt Activity Tolerance: Patient tolerated treatment well Patient left: in bed;with call bell/phone within reach;with bed alarm set Nurse Communication: Mobility status;Need for lift equipment PT Visit Diagnosis: Unsteadiness on feet (R26.81);Repeated falls (R29.6)    Time: 1324-4010 PT Time Calculation (min) (ACUTE ONLY): 38 min   Charges:   PT Evaluation $PT Eval Moderate Complexity: 1 Mod PT Treatments $Therapeutic Activity: 8-22 mins   PT G CodesBlanchard Kelch PT [phone]   [name] 04/04/2018, 3:48 PM

## 2018-04-04 NOTE — Progress Notes (Signed)
PROGRESS NOTE    Stephen Wade  ZOX:[id number] DOB: 1951/12/18 DOA: 04/02/2018 PCP: No primary care provider on file.     Brief Narrative:  Stephen Wade is a 67 y.o. male with medical history significant of multiple sclerosis, reflux, BPH presenting with worsening right-sided weakness and leaning to the right side concerning for an MS flare. He notes that at baseline he uses a power wheelchair.  He is typically able to transfer.  Yesterday he felt weaker and unable to transfer.  Today he noticed that he was leaning more to the right.  He also noticed right-sided weakness with it which is characteristic of his flares of MS.  He has had multiple sclerosis since 2005.  He was last admitted in November 2018.  He typically has 1-2 flares a year.  Neurology was consulted, they recommended MRI of the brain as well as starting Solu-Medrol IV times 5 days  Assessment & Plan:   Principal Problem:   Multiple sclerosis exacerbation (HCC)   Multiple sclerosis exacerbation -Neurology consulted -Solu-Medrol 1 g x 5 days (last dose 4/15 Monday)  -PT OT consulted -MRI brain with and without contrast ordered - patient declined  -Outpatient follow up with Dr. Epimenio Foot  Acute kidney injury -Resolved  BPH -Self caths at home -Continue Flomax  GERD -Continue PPI   DVT prophylaxis: Lovenox Code Status: Full code Family Communication: No family at bedside Disposition Plan: Pending improvement and IV Solu-Medrol for 5 days, PT OT eval    Consultants:   Neurology  Procedures:   None  Antimicrobials:  Anti-infectives (From admission, onward)   None       Subjective: No new complaints, states his balance feels better and no longer leaning to the right. Did not work with PT yesterday.   Objective: Vitals:   04/03/18 0546 04/03/18 1400 04/03/18 2028 04/04/18 0445  BP: 122/65 (!) 111/55 130/61 139/72  Pulse: 90 88 95 87  Resp: 18 18 12 16   Temp: 97.6 F (36.4 C) 97.6 F (36.4 C) 98.3  F (36.8 C) 98.2 F (36.8 C)  TempSrc: Oral Oral Oral Oral  SpO2: 96% 97% 97% 93%  Weight:      Height:        Intake/Output Summary (Last 24 hours) at 04/04/2018 0851 Last data filed at 04/04/2018 0300 Gross per 24 hour  Intake 1338.83 ml  Output 1200 ml  Net 138.83 ml   Filed Weights   04/02/18 0857  Weight: 90.7 kg (200 lb)    Examination: General exam: Appears calm and comfortable  Respiratory system: Clear to auscultation. Respiratory effort normal. Cardiovascular system: S1 & S2 heard, RRR. No JVD, murmurs, rubs, gallops or clicks. No pedal edema. Gastrointestinal system: Abdomen is nondistended, soft and nontender. No organomegaly or masses felt. Normal bowel sounds heard. Central nervous system: Alert and oriented.  Extremities: Symmetric in appearance, RLE +1/5 strength, LLE +3/5 strength  Skin: No rashes, lesions or ulcers Psychiatry: Judgement and insight appear normal. Mood & affect appropriate.    Data Reviewed: I have personally reviewed following labs and imaging studies  CBC: Recent Labs  Lab 04/02/18 0946 04/03/18 0407  WBC 7.1 9.0  NEUTROABS 5.1  --   HGB 14.1 13.4  HCT 42.6 40.8  MCV 89.7 88.5  PLT 275 287   Basic Metabolic Panel: Recent Labs  Lab 04/02/18 0946 04/03/18 0407 04/04/18 0354  NA 143 142 144  K 4.5 4.3 4.3  CL 108 110 112*  CO2 25 21* 21*  GLUCOSE 103* 156* 156*  BUN 24* 26* 27*  CREATININE 1.26* 1.14 0.92  CALCIUM 10.0 9.2 9.1  MG  --  1.6* 2.0   GFR: Estimated Creatinine Clearance: 87.9 mL/min (by C-G formula based on SCr of 0.92 mg/dL). Liver Function Tests: Recent Labs  Lab 04/02/18 0946 04/03/18 0407  AST 33 27  ALT 40 33  ALKPHOS 102 90  BILITOT 0.9 0.4  PROT 7.8 6.6  ALBUMIN 4.3 3.5   No results for input(s): LIPASE, AMYLASE in the last 168 hours. No results for input(s): AMMONIA in the last 168 hours. Coagulation Profile: No results for input(s): INR, PROTIME in the last 168 hours. Cardiac  Enzymes: No results for input(s): CKTOTAL, CKMB, CKMBINDEX, TROPONINI in the last 168 hours. BNP (last 3 results) No results for input(s): PROBNP in the last 8760 hours. HbA1C: No results for input(s): HGBA1C in the last 72 hours. CBG: No results for input(s): GLUCAP in the last 168 hours. Lipid Profile: No results for input(s): CHOL, HDL, LDLCALC, TRIG, CHOLHDL, LDLDIRECT in the last 72 hours. Thyroid Function Tests: Recent Labs    04/02/18 1548  TSH 0.682   Anemia Panel: No results for input(s): VITAMINB12, FOLATE, FERRITIN, TIBC, IRON, RETICCTPCT in the last 72 hours. Sepsis Labs: No results for input(s): PROCALCITON, LATICACIDVEN in the last 168 hours.  No results found for this or any previous visit (from the past 240 hour(s)).     Radiology Studies: Dg Chest Port 1 View  Result Date: 04/02/2018 CLINICAL DATA:  History of MS EXAM: PORTABLE CHEST 1 VIEW COMPARISON:  04/03/2010 FINDINGS: Minimal basilar atelectasis. Borderline heart size. No consolidation or effusion. No pneumothorax. IMPRESSION: No active disease.  Minimal basilar atelectasis. Electronically Signed   By: Jasmine Pang M.D.   On: 04/02/2018 19:24      Scheduled Meds: . baclofen  10 mg Oral TID  . enoxaparin (LOVENOX) injection  40 mg Subcutaneous Q24H  . feeding supplement (ENSURE ENLIVE)  237 mL Oral Daily  . pantoprazole  40 mg Oral Daily  . tamsulosin  0.8 mg Oral QHS  . traZODone  100 mg Oral QHS   Continuous Infusions: . methylPREDNISolone (SOLU-MEDROL) injection Stopped (04/03/18 1359)     LOS: 2 days    Time spent: 20 minutes   Noralee Stain, DO [facility] [email or web address] Password [facility] 04/04/2018, 8:51 AM

## 2018-04-04 NOTE — Progress Notes (Signed)
In and Out cath performed, 300cc clear yellow urine returned from bladder.  Condom cath applied for the night per pt request.

## 2018-04-04 NOTE — Evaluation (Signed)
Occupational Therapy Evaluation Patient Details Name: Stephen Wade MRN: [id number] DOB: 1951/08/07 Today's Date: 04/04/2018    History of Present Illness This 67 year old man was admitted with a MS exacerbation   Clinical Impression   Pt was admitted for the above.  Per chart, he has 1-2 exacerbations per year.  At baseline, he is able to get out of lift chair to scooter but has had several falls.  Will follow in acute setting focusing on mobility to assist with adls    Follow Up Recommendations  Supervision/Assistance - 24 hour(depending upon progress)    Equipment Recommendations  (? stedy vs other manual lift)    Recommendations for Other Services       Precautions / Restrictions Precautions Precautions: Fall Restrictions Weight Bearing Restrictions: No      Mobility Bed Mobility Overal bed mobility: Needs Assistance Bed Mobility: Supine to Sit;Sit to Supine    Rolling mod A with bed rail to R; max/total to L Supine to sit: Max assist;+2 for physical assistance Sit to supine: Max assist;+2 for physical assistance   General bed mobility comments: assist for legs and trunk.  Sleeps in a lazy boy lift chair  Transfers Overall transfer level: Needs assistance Equipment used: (sara stedy) Transfers: Sit to/from Stand Sit to Stand: Max assist;+2 physical assistance;Mod assist +2(depending upon surface height)         General transfer comment: 5 trials.  Pt initially wanted to try from low bed surface. Unable to stand completely with max +2 assistance. When bed raised, mod +2.  Had to use sara stedy as it was unsafe to try with RW due to knees buckling and pts need to pull up    Balance Overall balance assessment: History of Falls;Needs assistance Sitting-balance support: Bilateral upper extremity supported Sitting balance-Leahy Scale: Poor Sitting balance - Comments: reliant on bil UEs; loss of balance posteriorly in sitting                                    ADL either performed or assessed with clinical judgement   ADL Overall ADL's : Needs assistance/impaired Eating/Feeding: Independent   Grooming: Set up   Upper Body Bathing: Set up   Lower Body Bathing: Maximal assistance   Upper Body Dressing : Minimal assistance   Lower Body Dressing: Total assistance                 General ADL Comments: At this time, pt needs supported back for seated ADLs; LB from bed due to decreased balance.       Vision         Perception     Praxis      Pertinent Vitals/Pain Pain Assessment: No/denies pain     Hand Dominance (ambidexterous)   Extremity/Trunk Assessment Upper Extremity Assessment Upper Extremity Assessment: Overall WFL for tasks assessed(R hand tremor at times)           Communication Communication Communication: (dysarthric)   Cognition Arousal/Alertness: Awake/alert Behavior During Therapy: WFL for tasks assessed/performed Overall Cognitive Status: Within Functional Limits for tasks assessed                                     General Comments       Exercises     Shoulder Instructions      Home  Living Family/patient expects to be discharged to:: Private residence Living Arrangements: Spouse/significant other Available Help at Discharge: Family                         Home Equipment: Electric scooter;Hospital bed   Additional Comments: toilet is in a little closet.  Sleeps in lazyboy lift chair      Prior Functioning/Environment          Comments: wife assists as needed        OT Problem List: Decreased strength;Decreased activity tolerance;Impaired balance (sitting and/or standing);Decreased knowledge of use of DME or AE      OT Treatment/Interventions: Self-care/ADL training;DME and/or AE instruction;Patient/family education;Balance training;Therapeutic activities    OT Goals(Current goals can be found in the care plan section) Acute Rehab OT  Goals Patient Stated Goal: get back to baseline functioning OT Goal Formulation: With patient Time For Goal Achievement: 04/18/18 Potential to Achieve Goals: Fair ADL Goals Additional ADL Goal #1: pt will either go from sit to stand with stedy lift or roll side to side in lift chair with min A for adls Additional ADL Goal #2: pt will lean forward/side to side with min guard for toilet hygiene  OT Frequency: Min 2X/week   Barriers to D/C:            Co-evaluation              AM-PAC PT "6 Clicks" Daily Activity     Outcome Measure Help from another person eating meals?: None Help from another person taking care of personal grooming?: A Little Help from another person toileting, which includes using toliet, bedpan, or urinal?: A Lot Help from another person bathing (including washing, rinsing, drying)?: A Lot Help from another person to put on and taking off regular upper body clothing?: A Little Help from another person to put on and taking off regular lower body clothing?: Total 6 Click Score: 15   End of Session    Activity Tolerance: Patient tolerated treatment well Patient left: in bed;with call bell/phone within reach  OT Visit Diagnosis: Muscle weakness (generalized) (M62.81);History of falling (Z91.81)                Time: 9604-5409 OT Time Calculation (min): 38 min Charges:  OT General Charges $OT Visit: 1 Visit OT Evaluation $OT Eval Moderate Complexity: 1 Mod G-Codes:     [place], OTR/L [phone] 04/04/2018  Niall Illes 04/04/2018, 1:14 PM

## 2018-04-05 NOTE — Progress Notes (Signed)
PROGRESS NOTE    Stephen Wade  ZOX:[id number] DOB: [birth year] DOA: 04/02/2018 PCP: No primary care provider on file.     Brief Narrative:  Stephen Wade is a 67 y.o. male with medical history significant of multiple sclerosis, reflux, BPH presenting with worsening right-sided weakness and leaning to the right side concerning for an MS flare. He notes that at baseline he uses a power wheelchair.  He is typically able to transfer.  Yesterday he felt weaker and unable to transfer.  Today he noticed that he was leaning more to the right.  He also noticed right-sided weakness with it which is characteristic of his flares of MS.  He has had multiple sclerosis since 2005.  He was last admitted in November 2018.  He typically has 1-2 flares a year.  Neurology was consulted, they recommended MRI of the brain as well as starting Solu-Medrol IV times 5 days  Assessment & Plan:   Principal Problem:   Multiple sclerosis exacerbation (HCC)   Multiple sclerosis exacerbation -Neurology consulted -Solu-Medrol 1 g x 5 days (last dose 4/15 Monday)  -PT OT consulted, recommending SNF  -MRI brain with and without contrast ordered - patient declined  -Outpatient follow up with Dr. Epimenio Foot  Acute kidney injury -Resolved  BPH -Self caths at home -Continue Flomax  GERD -Continue PPI   DVT prophylaxis: Lovenox Code Status: Full code Family Communication: No family at bedside Disposition Plan: Pending improvement and IV Solu-Medrol for 5 days, SNF vs HHPT    Consultants:   Neurology  Procedures:   None  Antimicrobials:  Anti-infectives (From admission, onward)   None       Subjective: Doing well today.  No new complaints or new neurological changes.  Work with physical therapy yesterday.  Objective: Vitals:   04/04/18 0445 04/04/18 1344 04/04/18 2137 04/05/18 0659  BP: 139/72 122/66 (!) 144/65 (!) 142/78  Pulse: 87 77 83 76  Resp: 16 14 20 20   Temp: 98.2 F (36.8 C) 97.7 F (36.5  C) 98 F (36.7 C) 98.1 F (36.7 C)  TempSrc: Oral Oral Oral Oral  SpO2: 93% 96% 95% 94%  Weight:      Height:        Intake/Output Summary (Last 24 hours) at 04/05/2018 1223 Last data filed at 04/05/2018 1028 Gross per 24 hour  Intake 418 ml  Output 1500 ml  Net -1082 ml   Filed Weights   04/02/18 0857  Weight: 90.7 kg (200 lb)    Examination: General exam: Appears calm and comfortable  Respiratory system: Clear to auscultation. Respiratory effort normal. Cardiovascular system: S1 & S2 heard, RRR. No JVD, murmurs, rubs, gallops or clicks. No pedal edema. Gastrointestinal system: Abdomen is nondistended, soft and nontender. No organomegaly or masses felt. Normal bowel sounds heard. Central nervous system: Alert and oriented. No focal neurological deficits. Extremities: Symmetric, RLE 1/5 strength, LLE 3/5 strength which is baseline  Skin: No rashes, lesions or ulcers Psychiatry: Judgement and insight appear normal. Mood & affect appropriate.    Data Reviewed: I have personally reviewed following labs and imaging studies  CBC: Recent Labs  Lab 04/02/18 0946 04/03/18 0407  WBC 7.1 9.0  NEUTROABS 5.1  --   HGB 14.1 13.4  HCT 42.6 40.8  MCV 89.7 88.5  PLT 275 287   Basic Metabolic Panel: Recent Labs  Lab 04/02/18 0946 04/03/18 0407 04/04/18 0354  NA 143 142 144  K 4.5 4.3 4.3  CL 108 110 112*  CO2  25 21* 21*  GLUCOSE 103* 156* 156*  BUN 24* 26* 27*  CREATININE 1.26* 1.14 0.92  CALCIUM 10.0 9.2 9.1  MG  --  1.6* 2.0   GFR: Estimated Creatinine Clearance: 87.9 mL/min (by C-G formula based on SCr of 0.92 mg/dL). Liver Function Tests: Recent Labs  Lab 04/02/18 0946 04/03/18 0407  AST 33 27  ALT 40 33  ALKPHOS 102 90  BILITOT 0.9 0.4  PROT 7.8 6.6  ALBUMIN 4.3 3.5   No results for input(s): LIPASE, AMYLASE in the last 168 hours. No results for input(s): AMMONIA in the last 168 hours. Coagulation Profile: No results for input(s): INR, PROTIME in the  last 168 hours. Cardiac Enzymes: No results for input(s): CKTOTAL, CKMB, CKMBINDEX, TROPONINI in the last 168 hours. BNP (last 3 results) No results for input(s): PROBNP in the last 8760 hours. HbA1C: No results for input(s): HGBA1C in the last 72 hours. CBG: No results for input(s): GLUCAP in the last 168 hours. Lipid Profile: No results for input(s): CHOL, HDL, LDLCALC, TRIG, CHOLHDL, LDLDIRECT in the last 72 hours. Thyroid Function Tests: Recent Labs    04/02/18 1548  TSH 0.682   Anemia Panel: No results for input(s): VITAMINB12, FOLATE, FERRITIN, TIBC, IRON, RETICCTPCT in the last 72 hours. Sepsis Labs: No results for input(s): PROCALCITON, LATICACIDVEN in the last 168 hours.  No results found for this or any previous visit (from the past 240 hour(s)).     Radiology Studies: No results found.    Scheduled Meds: . baclofen  10 mg Oral TID  . enoxaparin (LOVENOX) injection  40 mg Subcutaneous Q24H  . feeding supplement (ENSURE ENLIVE)  237 mL Oral Daily  . pantoprazole  40 mg Oral Daily  . tamsulosin  0.8 mg Oral QHS  . traZODone  100 mg Oral QHS   Continuous Infusions: . methylPREDNISolone (SOLU-MEDROL) injection Stopped (04/04/18 1305)     LOS: 3 days    Time spent: 20 minutes   Noralee Stain, DO [facility] [email or web address] Password [facility] 04/05/2018, 12:23 PM

## 2018-04-05 NOTE — NC FL2 (Signed)
[place] MEDICAID FL2 LEVEL OF CARE SCREENING TOOL     IDENTIFICATION  Patient Name: Stephen Wade Birthdate: 05/29/1951 Sex: male Admission Date (Current Location): 04/02/2018  [facility] and [place] Number:  Producer, television/film/video and Address:  [facility],  [address] [place], [place] [address]      Provider Number: [id number]  Attending Physician Name and Address:  Noralee Stain, DO  Relative Name and Phone Number:       Current Level of Care: Hospital Recommended Level of Care: Skilled Nursing Facility Prior Approval Number:    Date Approved/Denied:   PASRR Number: [id number]  Discharge Plan: SNF    Current Diagnoses: Patient Active Problem List   Diagnosis Date Noted  . Urinary hesitancy 01/20/2018  . Pressure injury of skin 11/06/2017  . Acute lower UTI 11/04/2017  . Multiple sclerosis exacerbation (HCC) 11/04/2017  . Skin ulcer of sacral region (HCC) 05/26/2017  . Vitamin D deficiency 01/16/2016  . Spastic gait 02/10/2015  . Spastic diplegia (HCC) 02/10/2015  . Weakness 02/10/2015  . Disturbed cognition 02/10/2015  . Edema 05/12/2014  . Pressure ulcer of coccygeal region, stage 1 08/24/2013  . Hypogonadism male 03/11/2013  . Lower urinary tract symptoms (LUTS) 12/26/2011  . GERD 11/30/2010  . URINARY CALCULUS 10/05/2010  . CHEST PAIN 10/05/2010    Orientation RESPIRATION BLADDER Height & Weight     Self, Time, Situation, Place  Normal Continent, Indwelling catheter Weight: 200 lb (90.7 kg) Height:  6' (182.9 cm)  BEHAVIORAL SYMPTOMS/MOOD NEUROLOGICAL BOWEL NUTRITION STATUS      Continent Diet(regular diet)  AMBULATORY STATUS COMMUNICATION OF NEEDS Skin   Extensive Assist Verbally Normal                       Personal Care Assistance Level of Assistance  Bathing, Feeding, Dressing Bathing Assistance: Limited assistance Feeding assistance: Independent Dressing Assistance: Limited assistance     Functional Limitations Info   Sight, Hearing, Speech Sight Info: Adequate Hearing Info: Adequate Speech Info: Adequate    SPECIAL CARE FACTORS FREQUENCY  PT (By licensed PT), OT (By licensed OT)     PT Frequency: 5x OT Frequency: 5x            Contractures Contractures Info: Not present    Additional Factors Info  Code Status, Allergies Code Status Info: full code Allergies Info: nka           Current Medications (04/05/2018):  This is the current hospital active medication list Current Facility-Administered Medications  Medication Dose Route Frequency Provider Last Rate Last Dose  . baclofen (LIORESAL) tablet 10 mg  10 mg Oral TID Zigmund Daniel., MD   10 mg at 04/05/18 1023  . enoxaparin (LOVENOX) injection 40 mg  40 mg Subcutaneous Q24H Zigmund Daniel., MD   40 mg at 04/04/18 1959  . feeding supplement (ENSURE ENLIVE) (ENSURE ENLIVE) liquid 237 mL  237 mL Oral Daily Zigmund Daniel., MD   237 mL at 04/04/18 0945  . methylPREDNISolone sodium succinate (SOLU-MEDROL) 1,000 mg in sodium chloride 0.9 % 50 mL IVPB  1,000 mg Intravenous Daily Zigmund Daniel., MD   Stopped at 04/04/18 1305  . pantoprazole (PROTONIX) EC tablet 40 mg  40 mg Oral Daily Zigmund Daniel., MD   40 mg at 04/05/18 1023  . tamsulosin (FLOMAX) capsule 0.8 mg  0.8 mg Oral QHS Zigmund Daniel., MD   0.8 mg at  04/04/18 2229  . traZODone (DESYREL) tablet 100 mg  100 mg Oral QHS Noralee Stain, DO   100 mg at 04/04/18 2229     Discharge Medications: Please see discharge summary for a list of discharge medications.  Relevant Imaging Results:  Relevant Lab Results:   Additional Information SS [id number]  Nelwyn Salisbury, LCSW

## 2018-04-05 NOTE — Clinical Social Work Note (Signed)
Clinical Social Work Assessment  Patient Details  Name: Stephen Wade MRN: [id number] Date of Birth: May 31, 1951  Date of referral:  04/05/18               Reason for consult:  Facility Placement                Permission sought to share information with:  Family Supports Permission granted to share information::  Yes, Verbal Permission Granted  Name::     Wife Landscape architect::     Relationship::     Contact Information:     Housing/Transportation Living arrangements for the past 2 months:  [place] of Information:  Patient Patient Interpreter Needed:  None Criminal Activity/Legal Involvement Pertinent to Current Situation/Hospitalization:  No - Comment as needed Significant Relationships:  Spouse Lives with:  Spouse Do you feel safe going back to the place where you live?  Yes Need for family participation in patient care:  No (Coment)  Care giving concerns:  Pt lives at home with his wife. 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 Exercises daily and just started Home Health PT last week.  Pt has MS and reports at baseline he can transfer himself and "lately once in a while I suddenly lose upper body strength and I can't transfer and my wife has to help me." Uses a motorized scooter and wheelchair. Currently admitted inpatient since 4/11 for MS exacerbation.    Social Worker assessment / plan:  CSW consulted to assist with disposition- possibly SNF placement for ST rehab. Met with pt at bedside- he was alert, oriented, and very welcoming of CSW involvement. Pt reports he worked with PT yesterday in hospital and understands SNF v HHPT being recommended. He states, "I am open to it, but I try to be really self sufficient and just started getting HHPT 3x week anyway, so I don't want to decide for sure until I can work with PT again here before I DC or ambulate and see what these arms and legs can do." Pt states he and wife have been considering getting lift or susan  steady, otherwise have equipment set up at home and has home accommodations for his assistance needs. Pt requested SNF referrals in order to pursue potential options, and he will make decision in next day or so (anticipates he may be ready to DC in 1-2 days). CSW obtained PASRR and completed FL2, made referrals.   Plan: SNF for ST rehab v return home and continue HHPT. Will follow up with pt tomorrow to aid in decision-making and DC planning.   Employment status:  Aeronautical engineer:  Medicare PT Recommendations:  [place], Home with [place] / Referral to community resources:  [place]  Patient/Family's Response to care:  Pt very engaged and appreciative  Patient/Family's Understanding of and Emotional Response to Diagnosis, Current Treatment, and Prognosis:  Pt demonstrates thorough understanding of treatment and plan, describing to CSW events leading to admission and his treatment plan here including medications and potential DC plans.  States, "I try to be very self-sufficient, I am doing all I can to be as independent as possible."  Emotional Assessment Appearance:  Appears stated age Attitude/Demeanor/Rapport:  Engaged Affect (typically observed):  Appropriate, Accepting, Pleasant Orientation:  Oriented to Self, Oriented to Place, Oriented to  Time, Oriented to Situation Alcohol / Substance use:  Not Applicable Psych involvement (Current and /or in the community):  No (Comment)  Discharge  Needs  Concerns to be addressed:  Decision making concerns, Discharge Planning Concerns Readmission within the last 30 days:  No Current discharge risk:  Dependent with Mobility Barriers to Discharge:  Continued Medical Work up   [employer], LCSW 04/05/2018, 11:23 AM  [phone]

## 2018-04-05 NOTE — Plan of Care (Signed)
  Problem: Pain Managment: Goal: General experience of comfort will improve Outcome: Progressing   

## 2018-04-06 NOTE — Care Management Important Message (Signed)
Important Message  Patient Details  Name: DAYCEN KUNDERT MRN: [id number] Date of Birth: 1951/11/09   Medicare Important Message Given:  Yes    Caren Macadam 04/06/2018, 1:06 PMImportant Message  Patient Details  Name: THACKERY PORTUGAL MRN: [id number] Date of Birth: August 18, 1951   Medicare Important Message Given:  Yes    Caren Macadam 04/06/2018, 1:06 PM

## 2018-04-06 NOTE — Discharge Summary (Signed)
Physician Discharge Summary  HUSTON STONEHOCKER NFA:[id number] DOB: [birth year] DOA: 04/02/2018  PCP: No primary care provider on file.  Admit date: 04/02/2018 Discharge date: 04/06/2018  Admitted From: Home Disposition:  Home, declined SNF   Recommendations for Outpatient Follow-up:  1. Follow up with Neurology Dr. Epimenio Foot in 1-2 weeks   Home Health: PT OT   Equipment/Devices: None   Discharge Condition: Stable CODE STATUS: Full  Diet recommendation: Heart healthy   Brief/Interim Summary: Stephen Olden Heathis a 67 y.o.malewith medical history significant ofmultiple sclerosis, reflux, BPH presenting with worsening right-sided weakness and leaningto the right side concerning for an MS flare. He notes that at baseline he uses a power wheelchair. He is typically able to transfer. Yesterday he felt weaker and unable to transfer. Today he noticed that he was leaning more to the right. He also noticed right-sided weakness with it which is characteristic of his flares of MS.  He has had multiple sclerosis since 2005. He was last admitted in November 2018. He typically has 1-2 flares a year.  Neurology was consulted, they recommended MRI of the brain as well as starting Solu-Medrol IV times 5 days. Patient declined MRI brain. With IV solu-medrol, he continued to improve and strength of lower extremities improve back to baseline. He worked with PT who recommended SNF which he declined.   Discharge Diagnoses:  Principal Problem:   Multiple sclerosis exacerbation (HCC)   Multiple sclerosis exacerbation -Neurology consulted -Improved with Solu-Medrol 1 g x 5 days -PT OT consulted, recommending SNF - patient declined SNF  -MRI brain with and without contrast ordered - patient declined  -Outpatient follow up with Dr. Epimenio Foot  Acute kidney injury -Resolved  BPH -Self caths at home -Continue Flomax  GERD -Continue PPI    Discharge Instructions  Discharge Instructions    Activity as  tolerated - No restrictions   Complete by:  As directed    Call MD for:   Complete by:  As directed    Worsening weakness or new neurological changes   Call MD for:  difficulty breathing, headache or visual disturbances   Complete by:  As directed    Call MD for:  extreme fatigue   Complete by:  As directed    Call MD for:  hives   Complete by:  As directed    Call MD for:  persistant dizziness or light-headedness   Complete by:  As directed    Call MD for:  persistant nausea and vomiting   Complete by:  As directed    Call MD for:  severe uncontrolled pain   Complete by:  As directed    Call MD for:  temperature >100.4   Complete by:  As directed    Diet - low sodium heart healthy   Complete by:  As directed    Discharge instructions   Complete by:  As directed    You were cared for by a hospitalist during your hospital stay. If you have any questions about your discharge medications or the care you received while you were in the hospital after you are discharged, you can call the unit and asked to speak with the hospitalist on call if the hospitalist that took care of you is not available. Once you are discharged, your primary care physician will handle any further medical issues. Please note that NO REFILLS for any discharge medications will be authorized once you are discharged, as it is imperative that you return to your primary care physician (  or establish a relationship with a primary care physician if you do not have one) for your aftercare needs so that they can reassess your need for medications and monitor your lab values.     Allergies as of 04/06/2018   No Known Allergies     Medication List    STOP taking these medications   LORazepam 0.5 MG tablet Commonly known as:  ATIVAN     TAKE these medications   baclofen 10 MG tablet Commonly known as:  LIORESAL Take 1 tablet (10 mg total) by mouth 3 (three) times daily.   ENSURE Take 237 mLs daily by mouth.   OCREVUS  300 MG/10ML injection Generic drug:  ocrelizumab Inject 300 mg every 6 (six) months into the vein.   omeprazole 20 MG capsule Commonly known as:  PRILOSEC Take 20 mg daily as needed by mouth (for acid reflex).   tamsulosin 0.4 MG Caps capsule Commonly known as:  FLOMAX Take 0.8 mg by mouth at bedtime.      Follow-up Information    Sater, Pearletha Furl, MD. Schedule an appointment as soon as possible for a visit in 1 week(s).   Specialty:  Neurology Contact information: [address] [place] [address] [phone]          No Known Allergies  Consultations:  Neurology   Procedures/Studies: Dg Chest Port 1 View  Result Date: 04/02/2018 CLINICAL DATA:  History of MS EXAM: PORTABLE CHEST 1 VIEW COMPARISON:  04/03/2010 FINDINGS: Minimal basilar atelectasis. Borderline heart size. No consolidation or effusion. No pneumothorax. IMPRESSION: No active disease.  Minimal basilar atelectasis. Electronically Signed   By: Jasmine Pang M.D.   On: 04/02/2018 19:24       Discharge Exam: Vitals:   04/05/18 2012 04/06/18 0633  BP: 125/63 132/82  Pulse: 69 71  Resp: 16 (!) 24  Temp: 97.8 F (36.6 C) 97.6 F (36.4 C)  SpO2: 96% 94%    General: Pt is alert, awake, not in acute distress Cardiovascular: RRR, S1/S2 +, no rubs, no gallops Respiratory: CTA bilaterally, no wheezing, no rhonchi Abdominal: Soft, NT, ND, bowel sounds + Extremities: no edema, no cyanosis Neuro: Alert, RLE strength 1/5, LLE strength 3/5 which per patient is his baseline     The results of significant diagnostics from this hospitalization (including imaging, microbiology, ancillary and laboratory) are listed below for reference.     Microbiology: No results found for this or any previous visit (from the past 240 hour(s)).   Labs: BNP (last 3 results) No results for input(s): BNP in the last 8760 hours. Basic Metabolic Panel: Recent Labs  Lab 04/02/18 0946 04/03/18 0407 04/04/18 0354  NA  143 142 144  K 4.5 4.3 4.3  CL 108 110 112*  CO2 25 21* 21*  GLUCOSE 103* 156* 156*  BUN 24* 26* 27*  CREATININE 1.26* 1.14 0.92  CALCIUM 10.0 9.2 9.1  MG  --  1.6* 2.0   Liver Function Tests: Recent Labs  Lab 04/02/18 0946 04/03/18 0407  AST 33 27  ALT 40 33  ALKPHOS 102 90  BILITOT 0.9 0.4  PROT 7.8 6.6  ALBUMIN 4.3 3.5   No results for input(s): LIPASE, AMYLASE in the last 168 hours. No results for input(s): AMMONIA in the last 168 hours. CBC: Recent Labs  Lab 04/02/18 0946 04/03/18 0407  WBC 7.1 9.0  NEUTROABS 5.1  --   HGB 14.1 13.4  HCT 42.6 40.8  MCV 89.7 88.5  PLT 275 287  Cardiac Enzymes: No results for input(s): CKTOTAL, CKMB, CKMBINDEX, TROPONINI in the last 168 hours. BNP: Invalid input(s): POCBNP CBG: No results for input(s): GLUCAP in the last 168 hours. D-Dimer No results for input(s): DDIMER in the last 72 hours. Hgb A1c No results for input(s): HGBA1C in the last 72 hours. Lipid Profile No results for input(s): CHOL, HDL, LDLCALC, TRIG, CHOLHDL, LDLDIRECT in the last 72 hours. Thyroid function studies No results for input(s): TSH, T4TOTAL, T3FREE, THYROIDAB in the last 72 hours.  Invalid input(s): FREET3 Anemia work up No results for input(s): VITAMINB12, FOLATE, FERRITIN, TIBC, IRON, RETICCTPCT in the last 72 hours. Urinalysis    Component Value Date/Time   COLORURINE YELLOW 04/02/2018 0946   APPEARANCEUR CLEAR 04/02/2018 0946   LABSPEC 1.014 04/02/2018 0946   PHURINE 7.0 04/02/2018 0946   GLUCOSEU NEGATIVE 04/02/2018 0946   GLUCOSEU NEGATIVE 03/11/2013 1106   HGBUR NEGATIVE 04/02/2018 0946   HGBUR large 10/05/2010 1242   BILIRUBINUR NEGATIVE 04/02/2018 0946   BILIRUBINUR neg 03/11/2013 1039   KETONESUR NEGATIVE 04/02/2018 0946   PROTEINUR NEGATIVE 04/02/2018 0946   UROBILINOGEN 0.2 03/17/2013 1005   NITRITE NEGATIVE 04/02/2018 0946   LEUKOCYTESUR TRACE (A) 04/02/2018 0946   Sepsis Labs Invalid input(s): PROCALCITONIN,  WBC,   LACTICIDVEN Microbiology No results found for this or any previous visit (from the past 240 hour(s)).   Patient was seen and examined on the day of discharge and was found to be in stable condition. Time coordinating discharge: 25 minutes including assessment and coordination of care, as well as examination of the patient.   SIGNED:  Noralee Stain, DO [facility] Pager [phone]  If 7PM-7AM, please contact night-coverage [email or web address] Password [facility] 04/06/2018, 9:57 AM

## 2018-04-06 NOTE — Progress Notes (Signed)
PT Cancellation Note  Patient Details Name: Stephen Wade MRN: [id number] DOB: 04-21-1951   Cancelled Treatment:    Reason Eval/Treat Not Completed: Patient declined, no reason specified Pt reports d/c home today by PTAR and politely declines PT today. Pt reports plan to f/u with HHPT.      Andri Prestia,KATHrine E 04/06/2018, 10:16 AM Zenovia Jarred, PT, DPT 04/06/2018  Pager: [phone]

## 2018-04-06 NOTE — Progress Notes (Signed)
Per pt and wife the discharge plan is for pt to return home with home health services. Wife states that pt was active with [facility] for home health PT prior to admission. This CM contacted [facility] rep to inform of discharge today. Will need HHPT orders for dc. Pt requesting PTAR transport home. This CM will fill out Medical Necessity form for nursing to arrange PTAR transport. Sandford Craze RN,BSN,NCM [phone]

## 2018-04-08 DIAGNOSIS — G35 Multiple sclerosis: Secondary | ICD-10-CM | POA: Diagnosis not present

## 2018-04-08 DIAGNOSIS — N3941 Urge incontinence: Secondary | ICD-10-CM | POA: Diagnosis not present

## 2018-04-09 ENCOUNTER — Telehealth: Payer: Self-pay | Admitting: Neurology

## 2018-04-09 NOTE — Telephone Encounter (Signed)
Pts wife called wanting to know if Dr. Epimenio Foot can place an order for Home Health, stating they could use the help. Please call to advise

## 2018-04-09 NOTE — Telephone Encounter (Signed)
Spoke with Stephen Wade.  Home health referrals for PT, SW, HHA were sent to [facility] on 3/27.  PT and SW have already been out, and SW has spoken with her about caregiver/respite options, but Stephen Wade believes insurance will not cover any of this.  She is going to speak with the SW today and will clarify that.  The help they need is more involved than what can be given in 1-2 hours per day.  I let her know about [facility] would have to pay oop, but their rates are reasonable, and they also provide respite care when needed. She will keep this in mind when she speaks with the SW today/fim

## 2018-04-10 ENCOUNTER — Emergency Department (HOSPITAL_COMMUNITY)
Admission: EM | Admit: 2018-04-10 | Discharge: 2018-04-10 | Disposition: A | Discharge status: Skilled Nursing Facility | Payer: Medicare Other | Attending: Emergency Medicine | Admitting: Emergency Medicine

## 2018-04-10 ENCOUNTER — Encounter (HOSPITAL_COMMUNITY): Payer: Self-pay

## 2018-04-10 ENCOUNTER — Other Ambulatory Visit: Payer: Self-pay

## 2018-04-10 DIAGNOSIS — R531 Weakness: Secondary | ICD-10-CM | POA: Diagnosis not present

## 2018-04-10 DIAGNOSIS — N3000 Acute cystitis without hematuria: Secondary | ICD-10-CM | POA: Diagnosis not present

## 2018-04-10 DIAGNOSIS — G35 Multiple sclerosis: Secondary | ICD-10-CM | POA: Insufficient documentation

## 2018-04-10 DIAGNOSIS — R339 Retention of urine, unspecified: Secondary | ICD-10-CM | POA: Diagnosis not present

## 2018-04-10 DIAGNOSIS — R4182 Altered mental status, unspecified: Secondary | ICD-10-CM | POA: Diagnosis not present

## 2018-04-10 DIAGNOSIS — N401 Enlarged prostate with lower urinary tract symptoms: Secondary | ICD-10-CM | POA: Diagnosis present

## 2018-04-10 DIAGNOSIS — R4781 Slurred speech: Secondary | ICD-10-CM | POA: Diagnosis not present

## 2018-04-10 DIAGNOSIS — R5383 Other fatigue: Secondary | ICD-10-CM | POA: Diagnosis not present

## 2018-04-10 DIAGNOSIS — Z79899 Other long term (current) drug therapy: Secondary | ICD-10-CM | POA: Diagnosis not present

## 2018-04-10 DIAGNOSIS — R5381 Other malaise: Secondary | ICD-10-CM | POA: Diagnosis not present

## 2018-04-10 DIAGNOSIS — Z7409 Other reduced mobility: Secondary | ICD-10-CM | POA: Diagnosis not present

## 2018-04-10 DIAGNOSIS — M62838 Other muscle spasm: Secondary | ICD-10-CM | POA: Diagnosis not present

## 2018-04-10 DIAGNOSIS — M6281 Muscle weakness (generalized): Secondary | ICD-10-CM | POA: Diagnosis present

## 2018-04-10 DIAGNOSIS — N390 Urinary tract infection, site not specified: Secondary | ICD-10-CM | POA: Diagnosis not present

## 2018-04-10 DIAGNOSIS — I6789 Other cerebrovascular disease: Secondary | ICD-10-CM | POA: Diagnosis not present

## 2018-04-10 DIAGNOSIS — K219 Gastro-esophageal reflux disease without esophagitis: Secondary | ICD-10-CM | POA: Diagnosis not present

## 2018-04-10 LAB — I-STAT CHEM 8, ED
BUN: 22 mg/dL — ABNORMAL HIGH (ref 6–20)
CALCIUM ION: 1.19 mmol/L (ref 1.15–1.40)
Chloride: 105 mmol/L (ref 101–111)
Creatinine, Ser: 0.7 mg/dL (ref 0.61–1.24)
Glucose, Bld: 99 mg/dL (ref 65–99)
HCT: 41 % (ref 39.0–52.0)
Hemoglobin: 13.9 g/dL (ref 13.0–17.0)
Potassium: 4.1 mmol/L (ref 3.5–5.1)
SODIUM: 142 mmol/L (ref 135–145)
TCO2: 29 mmol/L (ref 22–32)

## 2018-04-10 LAB — URINALYSIS, ROUTINE W REFLEX MICROSCOPIC
Bilirubin Urine: NEGATIVE
Glucose, UA: NEGATIVE mg/dL
Ketones, ur: NEGATIVE mg/dL
Nitrite: POSITIVE — AB
Protein, ur: NEGATIVE mg/dL
Specific Gravity, Urine: 1.011 (ref 1.005–1.030)
pH: 7 (ref 5.0–8.0)

## 2018-04-10 MED ORDER — CEPHALEXIN 500 MG PO CAPS
500.0000 mg | ORAL_CAPSULE | Freq: Two times a day (BID) | ORAL | Status: DC
  Administered 2018-04-10: 500 mg via ORAL
  Filled 2018-04-10: qty 1

## 2018-04-10 MED ORDER — CEPHALEXIN 500 MG PO CAPS: 500.0000 mg | ORAL_CAPSULE | Freq: Two times a day (BID) | ORAL | 0 refills | Status: AC

## 2018-04-10 NOTE — ED Notes (Signed)
Bed: RESA Expected date:  Expected time:  Means of arrival:  Comments: EMS 67 yo male from home decline in mobility-MS patient-recent discharge from hospital 154/70 HR 90

## 2018-04-10 NOTE — ED Provider Notes (Signed)
[facility]-EMERGENCY DEPT Provider Note   CSN: [id number] Arrival date & time: 04/10/18  4098     History   Chief Complaint Chief Complaint  Patient presents with  . Weakness  . Multiple Sclerosis    HPI HELTON OLESON is a 67 y.o. male.  The history is provided by the patient and the spouse.  Weakness  This is a recurrent problem. The current episode started 2 days ago. The problem has been gradually worsening. There has been no fever. Pertinent negatives include no chest pain and no headaches.   Patient history of GERD, multiple sclerosis presents with recurrent weakness.  He reports he is feeling weakness on his right side.  He was recently in hospital for this, received IV Solu-Medrol for MS flare and felt improved.  He was offered to go to SNF, but he refused.  Over the past 2 days he has been getting worse.  He is unable to transfer himself at home.  He reports most of his weakness is in his legs.  He has had some falls, but no traumatic injuries.  No headache.  No chest pain.  No abdominal pain. Past Medical History:  Diagnosis Date  . GERD (gastroesophageal reflux disease)    had on- off chest pain, symptoms resolved with Prilosec 11/2010  . Lower urinary tract symptoms (LUTS)    Dr Patsi Sears  . MS (multiple sclerosis) (HCC) 2005  . Testicular hypogonadism   . Urge incontinence of urine   . Urolithiasis 2011    Patient Active Problem List   Diagnosis Date Noted  . Urinary hesitancy 01/20/2018  . Pressure injury of skin 11/06/2017  . Acute lower UTI 11/04/2017  . Multiple sclerosis exacerbation (HCC) 11/04/2017  . Skin ulcer of sacral region (HCC) 05/26/2017  . Vitamin D deficiency 01/16/2016  . Spastic gait 02/10/2015  . Spastic diplegia (HCC) 02/10/2015  . Weakness 02/10/2015  . Disturbed cognition 02/10/2015  . Edema 05/12/2014  . Pressure ulcer of coccygeal region, stage 1 08/24/2013  . Hypogonadism male 03/11/2013  . Lower urinary  tract symptoms (LUTS) 12/26/2011  . GERD 11/30/2010  . URINARY CALCULUS 10/05/2010  . CHEST PAIN 10/05/2010    Past Surgical History:  Procedure Laterality Date  . APPENDECTOMY      ruptured 03-2010  . VASECTOMY          Home Medications    Prior to Admission medications   Medication Sig Start Date End Date Taking? Authorizing Provider  baclofen (LIORESAL) 10 MG tablet Take 1 tablet (10 mg total) by mouth 3 (three) times daily. 12/24/17  Yes Sater, Pearletha Furl, MD  ENSURE (ENSURE) Take 237 mLs daily by mouth.   Yes [provider]  ocrelizumab (OCREVUS) 300 MG/10ML injection Inject 300 mg every 6 (six) months into the vein.   Yes [provider]  omeprazole (PRILOSEC) 20 MG capsule Take 20 mg daily as needed by mouth (for acid reflex).    Yes [provider]  tamsulosin (FLOMAX) 0.4 MG CAPS capsule Take 0.8 mg by mouth at bedtime. 02/26/16  Yes [provider]    Family History Family History  Problem Relation Age of Onset  . Heart attack Mother   . Brain cancer Father     Social History Social History   Tobacco Use  . Smoking status: Never Smoker  . Smokeless tobacco: Never Used  Substance Use Topics  . Alcohol use: No    Alcohol/week: 0.0 oz    Comment:  rare  . Drug use: No     Allergies   Patient has no known allergies.   Review of Systems Review of Systems  Constitutional: Positive for fatigue. Negative for fever.  Cardiovascular: Negative for chest pain.  Gastrointestinal: Negative for abdominal pain.  Neurological: Positive for weakness. Negative for headaches.  All other systems reviewed and are negative.    Physical Exam Updated Vital Signs BP 132/74 (BP Location: Right Arm)   Pulse 85   Temp 98 F (36.7 C) (Oral)   Resp 16   Ht 1.829 m (6')   Wt 88 kg (194 lb)   SpO2 96%   BMI 26.31 kg/m   Physical Exam CONSTITUTIONAL: Elderly and chronically ill-appearing HEAD: Normocephalic/atraumatic EYES:  EOMI ENMT: Mucous membranes moist NECK: supple no meningeal signs CV: S1/S2 noted, no murmurs/rubs/gallops noted LUNGS: Lungs are clear to auscultation bilaterally, no apparent distress ABDOMEN: soft, nontender, no rebound or guarding, bowel sounds noted throughout abdomen NEURO: Pt is awake/alert answers questions appropriately, but is slow to respond No focal weakness in his upper extremities.  He does have weakness in both legs EXTREMITIES: pulses normal/equal, full ROM, no deformities SKIN: warm, color normal PSYCH: no abnormalities of mood noted, alert and oriented to situation   ED Treatments / Results  Labs (all labs ordered are listed, but only abnormal results are displayed) Labs Reviewed  URINALYSIS, ROUTINE W REFLEX MICROSCOPIC - Abnormal; Notable for the following components:      Result Value   Hgb urine dipstick MODERATE (*)    Nitrite POSITIVE (*)    Leukocytes, UA LARGE (*)    Bacteria, UA RARE (*)    Squamous Epithelial / LPF 0-5 (*)    All other components within normal limits  I-STAT CHEM 8, ED - Abnormal; Notable for the following components:   BUN 22 (*)    All other components within normal limits    EKG None  Radiology No results found.  Procedures Procedures   Medications Ordered in ED Medications  cephALEXin (KEFLEX) capsule 500 mg (has no administration in time range)     Initial Impression / Assessment and Plan / ED Course  I have reviewed the triage vital signs and the nursing notes.  Pertinent labs  results that were available during my care of the patient were reviewed by me and considered in my medical decision making (see chart for details).     Presents from home with worsening MS flare.  Reports is similar to prior.  He just got out of the hospital.  He is now having incontinence.  He will likely need to be placed to an SNF  7:37 AM Patient stable at this time.  After discussion with patient and family, this is similar to prior  MS flares.  He now realizes that he cannot be managed at home.  There is no other new complaints, he denies any chest or abdominal pain.  No headache.  He just received 5 day course of IV solumedrol in hospital, he is unlikely to benefit from another round of solumedrol  He Will have a evaluation by case management/social work for placement.  He is noted to have a UTI, Keflex has been ordered.   Final Clinical Impressions(s) / ED Diagnoses   Final diagnoses:  Acute cystitis without hematuria  Multiple sclerosis [facility])    ED Discharge Orders    None       Zadie Rhine, MD 04/10/18 [phone]

## 2018-04-10 NOTE — ED Notes (Signed)
ED TO INPATIENT HANDOFF REPORT  Name/Age/Gender Stephen Wade 67 y.o. male  Code Status Code Status History    Date Active Date Inactive Code Status Order ID Comments User Context   04/02/2018 1515 04/06/2018 1609 Full Code [id number]  Zigmund Daniel., MD Inpatient   11/04/2017 1439 11/06/2017 1729 Full Code [id number]  Jerald Kief, MD ED    Advance Directive Documentation     Most Recent Value  Type of Advance Directive  Healthcare Power of Attorney, Living will  Pre-existing out of facility DNR order (yellow form or pink MOST form)  -  "MOST" Form in Place?  -      Home/SNF/Other Home  Chief Complaint Weakness due to MS  Level of Care/Admitting Diagnosis ED Disposition    None      Medical History Past Medical History:  Diagnosis Date  . GERD (gastroesophageal reflux disease)    had on- off chest pain, symptoms resolved with Prilosec 11/2010  . Lower urinary tract symptoms (LUTS)    Dr Patsi Sears  . MS (multiple sclerosis) (HCC) 2005  . Testicular hypogonadism   . Urge incontinence of urine   . Urolithiasis 2011    Allergies No Known Allergies  IV Location/Drains/Wounds Patient Lines/Drains/Airways Status   Active Line/Drains/Airways    Name:   Placement date:   Placement time:   Site:   Days:   Peripheral IV 04/10/18 Right Hand   04/10/18    1143    Hand   less than 1   Pressure Injury 04/10/18 Stage I -  Intact skin with non-blanchable redness of a localized area usually over a bony prominence. redness with small blistered areas to mid bilateral buttocks   04/10/18    0336     less than 1          Labs/Imaging Results for orders placed or performed during the hospital encounter of 04/10/18 (from the past 48 hour(s))  Urinalysis, Routine w reflex microscopic     Status: Abnormal   Collection Time: 04/10/18  6:54 AM  Result Value Ref Range   Color, Urine YELLOW YELLOW   APPearance CLEAR CLEAR   Specific Gravity, Urine 1.011 1.005 - 1.030   pH 7.0 5.0 - 8.0   Glucose, UA NEGATIVE NEGATIVE mg/dL   Hgb urine dipstick MODERATE (A) NEGATIVE   Bilirubin Urine NEGATIVE NEGATIVE   Ketones, ur NEGATIVE NEGATIVE mg/dL   Protein, ur NEGATIVE NEGATIVE mg/dL   Nitrite POSITIVE (A) NEGATIVE   Leukocytes, UA LARGE (A) NEGATIVE   RBC / HPF 6-30 0 - 5 RBC/hpf   WBC, UA TOO NUMEROUS TO COUNT 0 - 5 WBC/hpf   Bacteria, UA RARE (A) NONE SEEN   Squamous Epithelial / LPF 0-5 (A) NONE SEEN   WBC Clumps PRESENT    Mucus PRESENT     Comment: Performed at [facility], [address], [place], [place] [address]  I-stat chem 8, ed     Status: Abnormal   Collection Time: 04/10/18  7:02 AM  Result Value Ref Range   Sodium 142 135 - 145 mmol/L   Potassium 4.1 3.5 - 5.1 mmol/L   Chloride 105 101 - 111 mmol/L   BUN 22 (H) 6 - 20 mg/dL   Creatinine, Ser 5.05 0.61 - 1.24 mg/dL   Glucose, Bld 99 65 - 99 mg/dL   Calcium, Ion 1.83 3.58 - 1.40 mmol/L   TCO2 29 22 - 32 mmol/L   Hemoglobin 13.9 13.0 -  17.0 g/dL   HCT 72.5 36.6 - 44.0 %   No results found.  Pending Labs Unresulted Labs (From admission, onward)   None      Vitals/Pain Today's Vitals   04/10/18 0646 04/10/18 0700 04/10/18 0939 04/10/18 1100  BP: (!) 150/71 135/66  129/74  Pulse: 88 83  85  Resp: 17 16  19   Temp: 98.7 F (37.1 C)  97.8 F (36.6 C)   TempSrc: Oral     SpO2: 98% 94%  97%  Weight:      Height:      PainSc: 7        Isolation Precautions No active isolations  Medications Medications  cephALEXin (KEFLEX) capsule 500 mg (500 mg Oral Given 04/10/18 0940)    Mobility non-ambulatory

## 2018-04-10 NOTE — ED Notes (Signed)
Pt has defecated for the 3rd time in the bed.

## 2018-04-10 NOTE — NC FL2 (Signed)
[place] MEDICAID [id number] LEVEL OF CARE SCREENING TOOL     IDENTIFICATION  Patient Name: Stephen Wade Birthdate: 07/20/1951 Sex: male Admission Date (Current Location): 04/10/2018  [facility] [place] and [place] Number:  Producer, television/film/video and Address:  [facility],  [address], [place] [address]      Provider Number: [id number]  Attending Physician Name and Address:  Default, Provider, MD  Relative Name and Phone Number:       Current Level of Care: Hospital Recommended Level of Care: Skilled Nursing Facility Prior Approval Number:    Date Approved/Denied:   PASRR Number: [id number]  Discharge Plan: SNF    Current Diagnoses: Patient Active Problem List   Diagnosis Date Noted  . Urinary hesitancy 01/20/2018  . Pressure injury of skin 11/06/2017  . Acute lower UTI 11/04/2017  . Multiple sclerosis exacerbation (HCC) 11/04/2017  . Skin ulcer of sacral region (HCC) 05/26/2017  . Vitamin D deficiency 01/16/2016  . Spastic gait 02/10/2015  . Spastic diplegia (HCC) 02/10/2015  . Weakness 02/10/2015  . Disturbed cognition 02/10/2015  . Edema 05/12/2014  . Pressure ulcer of coccygeal region, stage 1 08/24/2013  . Hypogonadism male 03/11/2013  . Lower urinary tract symptoms (LUTS) 12/26/2011  . GERD 11/30/2010  . URINARY CALCULUS 10/05/2010  . CHEST PAIN 10/05/2010    Orientation RESPIRATION BLADDER Height & Weight     Self, Time, Situation, Place  Normal Incontinent, Indwelling catheter Weight: 194 lb (88 kg) Height:  6' (182.9 cm)  BEHAVIORAL SYMPTOMS/MOOD NEUROLOGICAL BOWEL NUTRITION STATUS        Diet(Regular )  AMBULATORY STATUS COMMUNICATION OF NEEDS Skin   Extensive Assist Verbally                         Personal Care Assistance Level of Assistance  Bathing, Feeding, Dressing Bathing Assistance: Maximum assistance Feeding assistance: Limited assistance Dressing Assistance: Maximum assistance     Functional Limitations Info    Sight  Info: Adequate Hearing Info: Adequate Speech Info: Adequate    SPECIAL CARE FACTORS FREQUENCY  PT (By licensed PT), OT (By licensed OT)     PT Frequency: 5 OT Frequency: 5            Contractures      Additional Factors Info  Code Status, Allergies Code Status Info: Full Code  Allergies Info: NKA            Current Medications (04/10/2018):  This is the current hospital active medication list Current Facility-Administered Medications  Medication Dose Route Frequency Provider Last Rate Last Dose  . cephALEXin (KEFLEX) capsule 500 mg  500 mg Oral Q12H Zadie Rhine, MD   500 mg at 04/10/18 0940   Current Outpatient Medications  Medication Sig Dispense Refill  . baclofen (LIORESAL) 10 MG tablet Take 1 tablet (10 mg total) by mouth 3 (three) times daily. 270 tablet 2  . ENSURE (ENSURE) Take 237 mLs daily by mouth.    Marland Kitchen ocrelizumab (OCREVUS) 300 MG/10ML injection Inject 300 mg every 6 (six) months into the vein.    Marland Kitchen omeprazole (PRILOSEC) 20 MG capsule Take 20 mg daily as needed by mouth (for acid reflex).     . tamsulosin (FLOMAX) 0.4 MG CAPS capsule Take 0.8 mg by mouth at bedtime.  5     Discharge Medications: Please see discharge summary for a list of discharge medications.  Relevant Imaging Results:  Relevant Lab Results:   Additional Information SS#: [id number]  Weston Anna, LCSW

## 2018-04-10 NOTE — Progress Notes (Signed)
PT Cancellation Note  Patient Details Name: Stephen Wade MRN: [id number] DOB: [birth year]   Cancelled Treatment:    Reason Eval/Treat Not Completed: Other (comment)Patient in need of nursing care at the time checked on patient. ERIN, MSW aware that PT will return about 1:30.   [name] 04/10/2018, 12:13 PM  Blanchard Kelch PT [phone]

## 2018-04-10 NOTE — Progress Notes (Addendum)
CSW intern met with patient via bedside to discuss facility placement options once stable for discharge. Patient was unaware of various medicare stipulations in which this writer informed him of his three night stay enabling him to stay at a potential SNF. Patient on previous admission refused SNF placement, at this point patient is now agreeable.  Patient observed to be teary and overwhelmed, however was agreeable to PT assessing him and moving forward with being faxed out to various facilities. This Probation officer asked that patients EDP order a PT consult. This Probation officer also asked patient if calling patients wife would be acceptable to go over what has been discussed as of this morning. Patient agreed, this Probation officer called patients wife, Chanse Kagel [phone]. Mrs. Sanzo was agreeable to patient being faxed out to various facilities and in the mean time Mrs. Mester would be looking into various home health services.  CSW intern will continue to update with discharge planning.  Gean Quint, Social Work Intern  [phone]

## 2018-04-10 NOTE — ED Notes (Signed)
Bed: WA18 Expected date:  Expected time:  Means of arrival:  Comments: Hold for RES A 

## 2018-04-10 NOTE — Progress Notes (Addendum)
Patient has been accepted to [facility] (formly known at [facility]). CSW notified patient and spouse, Verlon Au, via bedside. Patient will need transportation via [facility]- RN, Lucrezia Europe, has been notified.   Please call report to [phone]. Patient will be going to room 130-A.  CSW has notified EDP who is agreeable to plan and will provide patient with script for Keflex.   Stacy Gardner, [facility] Emergency Room Clinical Social Worker [phone]

## 2018-04-10 NOTE — ED Triage Notes (Signed)
Patient arrives by [facility] with complaints of increased weakness and falls since release from hospital Monday-patient declined rehab and has steadily gotten weaker with multiple falls over the last 3 days-EMS states they have been called out multiple times due to falls. Patient now has incontinence of bowels. Patient has MS and lives with his wife.

## 2018-04-10 NOTE — Discharge Planning (Signed)
Clinical Social Work is seeking post-discharge placement for this patient at the following level of care: SNF  Follow for disposition needs.

## 2018-04-10 NOTE — Evaluation (Signed)
Physical Therapy Evaluation Patient Details Name: ROMAIN ERION MRN: [id number] DOB: August 04, 1951 Today's Date: 04/10/2018   History of Present Illness  This 67 year old man was admitted to ED4/18 with increased weakness and fall, UTI. recently in Hospital  with a MS exacerbation and received steroid treatment.  Clinical Impression  The  Patient is known to this writer from evaluation on 4/13.19 during last admission. The patient presents with SIGNIFICANT change in function and ability to mobilize, requires total assistance and  Demonstrates no sitting balance this visit. Patient was able to sit and stand at lift equipment with min/mod assist x 5 during PT eval on 4/13.     Follow Up Recommendations SNF    Equipment Recommendations  None recommended by PT    Recommendations for Other Services       Precautions / Restrictions Precautions Precautions: Fall Precaution Comments: has extensor spasms in legs at time      Mobility  Bed Mobility Overal bed mobility: Needs Assistance Bed Mobility: Rolling;Supine to Sit;Sit to Supine Rolling: Max assist;+2 for physical assistance;+2 for safety/equipment   Supine to sit: Total assist;+2 for physical assistance;+2 for safety/equipment Sit to supine: +2 for physical assistance;Total assist;+2 for safety/equipment   General bed mobility comments: decreased ability to reach with right UE for riolling. Complete total assist to sit up and return to supine, Patient  expresses fear of falling.  Transfers                 General transfer comment: unable due to no sitting balance  Ambulation/Gait                Stairs            Wheelchair Mobility    Modified Rankin (Stroke Patients Only)       Balance Overall balance assessment: History of Falls;Needs assistance Sitting-balance support: Feet supported;Bilateral upper extremity supported Sitting balance-Leahy Scale: Zero Sitting balance - Comments: no control                                       Pertinent Vitals/Pain Pain Assessment: Faces Faces Pain Scale: Hurts little more Pain Location: back Pain Descriptors / Indicators: Discomfort Pain Intervention(s): Monitored during session    Home Living Family/patient expects to be discharged to:: Private residence Living Arrangements: Spouse/significant other Available Help at Discharge: Family   Home Access: Ramped entrance       Home Equipment: Electric scooter;Hospital bed Additional Comments: toilet is in a little closet.  Sleeps in lazyboy lift chair, reports multiple calls, Fire department has to assist    Prior Function           Comments: wife assists as needed     Hand Dominance        Extremity/Trunk Assessment   Upper Extremity Assessment Upper Extremity Assessment: LUE deficits/detail RUE Deficits / Details: increased flexor tone in biceps, relaxation techniques to increase extension performed LUE Deficits / Details: able to lift  arm above head    Lower Extremity Assessment RLE Deficits / Details: nearly no active movement LLE Deficits / Details: minimal movement, lifts 1 inch    Cervical / Trunk Assessment Cervical / Trunk Assessment: Other exceptions Cervical / Trunk Exceptions: no control of the trunk  Communication   Communication: (dysarthric)  Cognition Arousal/Alertness: Awake/alert Behavior During Therapy: WFL for tasks assessed/performed Overall Cognitive Status: Within Functional Limits for  tasks assessed                                 General Comments: Tearful      General Comments      Exercises     Assessment/Plan    PT Assessment Patient needs continued PT services  PT Problem List Decreased strength;Decreased activity tolerance;Decreased balance;Decreased mobility       PT Treatment Interventions DME instruction;Functional mobility training;Patient/family education;Therapeutic activities    PT  Goals (Current goals can be found in the Care Plan section)  Acute Rehab PT Goals Patient Stated Goal: to go to rehab PT Goal Formulation: With patient/family Time For Goal Achievement: 04/24/18 Potential to Achieve Goals: Fair    Frequency Min 2X/week   Barriers to discharge Decreased caregiver support      Co-evaluation               AM-PAC PT "6 Clicks" Daily Activity  Outcome Measure Difficulty turning over in bed (including adjusting bedclothes, sheets and blankets)?: Unable Difficulty moving from lying on back to sitting on the side of the bed? : Unable Difficulty sitting down on and standing up from a chair with arms (e.g., wheelchair, bedside commode, etc,.)?: Unable Help needed moving to and from a bed to chair (including a wheelchair)?: Total Help needed walking in hospital room?: Total Help needed climbing 3-5 steps with a railing? : Total 6 Click Score: 6    End of Session   Activity Tolerance: Patient limited by fatigue Patient left: in bed;with call bell/phone within reach;with family/visitor present Nurse Communication: Mobility status;Need for lift equipment PT Visit Diagnosis: Unsteadiness on feet (R26.81);Repeated falls (R29.6)    Time: 1315-1350 PT Time Calculation (min) (ACUTE ONLY): 35 min   Charges:   PT Evaluation $PT Eval Moderate Complexity: 1 Mod PT Treatments $Therapeutic Activity: 8-22 mins   PT G CodesBlanchard Kelch PT [phone] }  [name] 04/10/2018, 2:14 PM
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
## 2018-04-10 NOTE — ED Notes (Signed)
Social Work Field seismologist came and said there was a PT consult in for patient and she was inquiring about that. Clydie Braun Diplomatic Services operational officer notified and attempting to call PT. Social work notified of plan and that there may not be PT due to it being Good Friday.

## 2018-04-10 NOTE — ED Notes (Signed)
Report has been given to Health and safety inspector at [facility].

## 2018-04-10 NOTE — ED Notes (Signed)
Patient incontinent of urine and stool-patient has on a condom cath-cleaned for large amount soft brown stool

## 2018-04-10 NOTE — ED Notes (Signed)
[facility] has been called and is in route to facility.

## 2018-04-13 ENCOUNTER — Non-Acute Institutional Stay (SKILLED_NURSING_FACILITY): Payer: Medicare Other | Admitting: Adult Health

## 2018-04-13 ENCOUNTER — Encounter: Payer: Self-pay | Admitting: Adult Health

## 2018-04-13 DIAGNOSIS — N390 Urinary tract infection, site not specified: Secondary | ICD-10-CM

## 2018-04-13 DIAGNOSIS — R339 Retention of urine, unspecified: Secondary | ICD-10-CM

## 2018-04-13 DIAGNOSIS — R5381 Other malaise: Secondary | ICD-10-CM | POA: Diagnosis not present

## 2018-04-13 DIAGNOSIS — G35 Multiple sclerosis: Secondary | ICD-10-CM | POA: Diagnosis not present

## 2018-04-13 NOTE — Progress Notes (Signed)
Location:   [facility] Room Number: 130 A Place of Service:  SNF (31)   CODE STATUS: Full Code  No Known Allergies  Chief Complaint  Patient presents with  . Hospitalization Follow-up    Hospital Follow up    HPI:  He is a 67 year old man who has been hospitalized from 04-02-18 through 04-06-18.  He had been hospitalized for an acute MS flare. He had declined going to SNF at that time. He has since returned to the ED and is now being treated for UTI. He is here for short term rehab with his goal to return back home. He had been able to stand at home for transfers. He had been self cathing at home for his urine retention. He would like to have the foley removed and to restart his home regimen. His family will bring supplies from home. He denies any pain; no changes in appetite; no bladder pain. There are no nursing concerns at this time he will continue to be followed for his chronic illnesses including: MS; gerd and urinary retention.   Past Medical History:  Diagnosis Date  . GERD (gastroesophageal reflux disease)    had on- off chest pain, symptoms resolved with Prilosec 11/2010  . Lower urinary tract symptoms (LUTS)    Dr Patsi Sears  . MS (multiple sclerosis) (HCC) 2005  . Testicular hypogonadism   . Urge incontinence of urine   . Urolithiasis 2011    Past Surgical History:  Procedure Laterality Date  . APPENDECTOMY      ruptured 03-2010  . VASECTOMY      Social History   Socioeconomic History  . Marital status: Married    Spouse name: Not on file  . Number of children: 3  . Years of education: Not on file  . Highest education level: Not on file  Occupational History  . Occupation: Occupation: independent Manufacturing engineer: [employer]  Social Needs  . Financial resource strain: Not on file  . Food insecurity:    Worry: Not on file    Inability: Not on file  . Transportation needs:    Medical: Not on file   Non-medical: Not on file  Tobacco Use  . Smoking status: Never Smoker  . Smokeless tobacco: Never Used  Substance and Sexual Activity  . Alcohol use: No    Alcohol/week: 0.0 oz    Comment: rare  . Drug use: No  . Sexual activity: Not on file  Lifestyle  . Physical activity:    Days per week: Not on file    Minutes per session: Not on file  . Stress: Not on file  Relationships  . Social connections:    Talks on phone: Not on file    Gets together: Not on file    Attends religious service: Not on file    Active member of club or organization: Not on file    Attends meetings of clubs or organizations: Not on file    Relationship status: Not on file  . Intimate partner violence:    Fear of current or ex partner: Not on file    Emotionally abused: Not on file    Physically abused: Not on file    Forced sexual activity: Not on file  Other Topics Concern  . Not on file  Social History Narrative  . Not on file   Family History  Problem Relation Age of Onset  . Heart attack Mother   .  Brain cancer Father       VITAL SIGNS BP (!) 110/50   Pulse 76   Temp 98 F (36.7 C)   Resp 20   Ht 6' (1.829 m)   Wt 194 lb (88 kg)   SpO2 99%   BMI 26.31 kg/m   Outpatient Encounter Medications as of 04/13/2018  Medication Sig  . baclofen (LIORESAL) 10 MG tablet Take 1 tablet (10 mg total) by mouth 3 (three) times daily.  . Baclofen 5 MG TABS Take 5 tablets by mouth 3 (three) times daily. Give with 10 mg x 3 days  . cephALEXin (KEFLEX) 500 MG capsule Take 1 capsule (500 mg total) by mouth 2 (two) times daily for 5 days.  . ENSURE (ENSURE) Take 237 mLs daily by mouth.  Marland Kitchen ocrelizumab (OCREVUS) 300 MG/10ML injection Inject 300 mg every 6 (six) months into the vein.  Marland Kitchen omeprazole (PRILOSEC) 20 MG capsule Take 20 mg daily as needed by mouth (for acid reflex).   . tamsulosin (FLOMAX) 0.4 MG CAPS capsule Take 0.8 mg by mouth at bedtime.  . traMADol (ULTRAM) 50 MG tablet Take 50 mg by mouth  every 6 (six) hours as needed.   No facility-administered encounter medications on file as of 04/13/2018.      SIGNIFICANT DIAGNOSTIC EXAMS  TODAY;   04-02-18: chest x-ray: No active disease. Minimal basilar atelectasis.   LABS REVIEWED   04-02-18: wbc 7.1; hgb 14.1; hct 42.6; mcv 89.7 plt 275; glucose 103; bun 24; creat 1.26; k+ 4.5; an++ 143; ca 10.0; liver normal albumin 4.3; tsh 0.628 04-04-18: glucose 156; bun 27; creat 0.92; k+ 4.3; na++144; ca 9.1; mag 2.0    Review of Systems  Constitutional: Positive for malaise/fatigue.  Respiratory: Negative for cough and shortness of breath.   Cardiovascular: Negative for chest pain, palpitations and leg swelling.  Gastrointestinal: Negative for abdominal pain, constipation and heartburn.  Musculoskeletal: Negative for back pain, joint pain and myalgias.       Has weakness in all extremities   Skin: Negative.   Neurological: Negative for dizziness.  Psychiatric/Behavioral: The patient is not nervous/anxious.     Physical Exam  Constitutional: He is oriented to person, place, and time. He appears well-developed and well-nourished. No distress.  Neck: No thyromegaly present.  Cardiovascular: Normal rate, regular rhythm, normal heart sounds and intact distal pulses.  Pulmonary/Chest: Effort normal and breath sounds normal. No respiratory distress.  Abdominal: Soft. Bowel sounds are normal. He exhibits no distension. There is no tenderness.  Genitourinary:  Genitourinary Comments: Foley   Musculoskeletal: He exhibits no edema.  Has right side weakness Very little movement in lower extremities Speech slow  Lymphadenopathy:    He has no cervical adenopathy.  Neurological: He is alert and oriented to person, place, and time.  Skin: Skin is warm and dry. He is not diaphoretic.  Psychiatric: He has a normal mood and affect.     ASSESSMENT/ PLAN:  TODAY:   1. UTI: is stable will complete keflex and will monitor   2. Urinary  retention: stable will have family bring his supplies from home; will then remove foley and he can perform self cathing. Will continue flomax 0.8 mg daily   3. MS: is without change: he has completed his steroids. Will continue Ocrevus 300 mg IM with neurology every 6 months; will continue baclofen 15 mg three times daily for 3 days then 10 mg three times daily   4. Physical deconditioning: without change: will continue therapy  as directed to improve upon his level of independence with his adls    MD is aware of resident's narcotic use and is in agreement with current plan of care. We will attempt to wean resident as apropriate   Synthia Innocent NP [facility]  Contact [phone] Monday through Friday 8am- 5pm  After hours call [phone]

## 2018-04-16 ENCOUNTER — Encounter: Payer: Self-pay | Admitting: Internal Medicine

## 2018-04-16 ENCOUNTER — Non-Acute Institutional Stay (SKILLED_NURSING_FACILITY): Payer: Medicare Other | Admitting: Internal Medicine

## 2018-04-16 DIAGNOSIS — M62838 Other muscle spasm: Secondary | ICD-10-CM

## 2018-04-16 DIAGNOSIS — K219 Gastro-esophageal reflux disease without esophagitis: Secondary | ICD-10-CM | POA: Diagnosis not present

## 2018-04-16 DIAGNOSIS — N390 Urinary tract infection, site not specified: Secondary | ICD-10-CM

## 2018-04-16 DIAGNOSIS — R5381 Other malaise: Secondary | ICD-10-CM

## 2018-04-16 DIAGNOSIS — R339 Retention of urine, unspecified: Secondary | ICD-10-CM

## 2018-04-16 DIAGNOSIS — G35 Multiple sclerosis: Secondary | ICD-10-CM | POA: Diagnosis not present

## 2018-04-16 NOTE — Progress Notes (Signed)
Patient ID: Stephen Wade, male   DOB: 1951/07/13, 67 y.o.   MRN: [id number]  Provider:  DR Elmon Kirschner Location:  [facility] Room Number: 130 A Place of Service:  SNF (31)  PCP: Wanda Plump, MD Patient Care Team: Wanda Plump, MD as PCP - General (Internal Medicine) Jethro Bolus, MD as Consulting Physician (Urology)  Extended Emergency Contact Information Primary Emergency Contact: Sharman,Leslie Address: [address]          [place], [place] Home Phone: [phone] Work Phone: [phone] Relation: Spouse  Code Status: Full Code Goals of Care: Advanced Directive information Advanced Directives 04/16/2018  Does Patient Have a Medical Advance Directive? No  Type of Advance Directive -  Does patient want to make changes to medical advance directive? No - Patient declined  Copy of Healthcare Power of Attorney in Chart? -  Would patient like information on creating a medical advance directive? No - Patient declined      Chief Complaint  Patient presents with  . New Admit To SNF    Admission    HPI: Patient is a 67 y.o. male seen today for admission to SNF following hospital stay for MS exacerbation and acute cystitis. He declined MRI of brain. Neurology consulted and he was started on IV solumedrol. PT recommended SNF but pt declined and was dc/'d home. He presented to the ED 04/10/18 with cystitis. He was Rx keflex and agreed to SNF. He presents to SNF for short term rehab.  He is c/a return of UTI. He completed abx. He performs self caths and was doing so at home. He has urinary retention. He is tolerating therapy.  Current occupation is Armed forces operational officer.  Urinary retention - stable with self cathing. He takes flomax 0.8 mg daily   MS - now stable on Ocrevus 300 mg IM every 6 months; baclofen 15 mg three times daily for 3 days then 10 mg three times daily. Followed by neurology.   Past Medical History:  Diagnosis Date    . GERD (gastroesophageal reflux disease)    had on- off chest pain, symptoms resolved with Prilosec 11/2010  . Lower urinary tract symptoms (LUTS)    Dr Patsi Sears  . MS (multiple sclerosis) (HCC) 2005  . Testicular hypogonadism   . Urge incontinence of urine   . Urolithiasis 2011   Past Surgical History:  Procedure Laterality Date  . APPENDECTOMY      ruptured 03-2010  . VASECTOMY      reports that he has never smoked. He has never used smokeless tobacco. He reports that he does not drink alcohol or use drugs. Social History   Socioeconomic History  . Marital status: Married    Spouse name: Not on file  . Number of children: 3  . Years of education: Not on file  . Highest education level: Not on file  Occupational History  . Occupation: Occupation: independent Manufacturing engineer: [employer]  Social Needs  . Financial resource strain: Not on file  . Food insecurity:    Worry: Not on file    Inability: Not on file  . Transportation needs:    Medical: Not on file    Non-medical: Not on file  Tobacco Use  . Smoking status: Never Smoker  . Smokeless tobacco: Never Used  Substance and Sexual Activity  . Alcohol use: No    Alcohol/week: 0.0 oz    Comment: rare  . Drug  use: No  . Sexual activity: Not on file  Lifestyle  . Physical activity:    Days per week: Not on file    Minutes per session: Not on file  . Stress: Not on file  Relationships  . Social connections:    Talks on phone: Not on file    Gets together: Not on file    Attends religious service: Not on file    Active member of club or organization: Not on file    Attends meetings of clubs or organizations: Not on file    Relationship status: Not on file  . Intimate partner violence:    Fear of current or ex partner: Not on file    Emotionally abused: Not on file    Physically abused: Not on file    Forced sexual activity: Not on file  Other Topics Concern  . Not on file   Social History Narrative  . Not on file    Functional Status Survey:    Family History  Problem Relation Age of Onset  . Heart attack Mother   . Brain cancer Father     Health Maintenance  Topic Date Due  . COLONOSCOPY  04/14/2019 (Originally 01/01/2003)  . TETANUS/TDAP  04/14/2019 (Originally 07/23/2017)  . PNA vac Low Risk Adult (1 of 2 - PCV13) 04/14/2019 (Originally 01/01/2018)  . INFLUENZA VACCINE  07/23/2018  . Hepatitis C Screening  Completed  . HIV Screening  Completed    No Known Allergies  Outpatient Encounter Medications as of 04/16/2018  Medication Sig  . baclofen (LIORESAL) 10 MG tablet Take 1 tablet (10 mg total) by mouth 3 (three) times daily.  Marland Kitchen ENSURE (ENSURE) Take 237 mLs daily by mouth.  Marland Kitchen ocrelizumab (OCREVUS) 300 MG/10ML injection Inject 300 mg every 6 (six) months into the vein.  Marland Kitchen omeprazole (PRILOSEC) 20 MG capsule Take 20 mg daily as needed by mouth (for acid reflex).   . tamsulosin (FLOMAX) 0.4 MG CAPS capsule Take 0.8 mg by mouth at bedtime.   No facility-administered encounter medications on file as of 04/16/2018.     Review of Systems  Musculoskeletal: Positive for gait problem.       (+) muscle cramps and drawing sensation after sleeping on air mattress  Neurological: Positive for weakness.  All other systems reviewed and are negative.   Vitals:   04/16/18 0857  BP: 131/86  Pulse: 89  Resp: 18  Temp: (!) 97 F (36.1 C)  SpO2: 97%  Weight: 193 lb 1.6 oz (87.6 kg)  Height: 6' (1.829 m)   Body mass index is 26.19 kg/m. Physical Exam  Constitutional: He is oriented to person, place, and time. He appears well-developed and well-nourished.  Frail appearing in NAD  HENT:  Mouth/Throat: Oropharynx is clear and moist.  MMM; no oral thrush  Eyes: Pupils are equal, round, and reactive to light. No scleral icterus.  Neck: Neck supple. Carotid bruit is not present. No thyromegaly present.  Cardiovascular: Normal rate, regular rhythm and  intact distal pulses. Exam reveals no gallop and no friction rub.  Murmur (1/6 SEM) heard. Trace LE edema b/l. No calf TTP  Pulmonary/Chest: Effort normal and breath sounds normal. He has no wheezes. He has no rales. He exhibits no tenderness.  Abdominal: Soft. Bowel sounds are normal. He exhibits no distension, no abdominal bruit, no pulsatile midline mass and no mass. There is no hepatomegaly. There is no tenderness. There is no rebound and no guarding.  Musculoskeletal: He exhibits edema.  Lymphadenopathy:    He has no cervical adenopathy.  Neurological: He is alert and oriented to person, place, and time. He has normal reflexes. He displays atrophy. He exhibits abnormal muscle tone.  Skin: Skin is warm and dry. No rash noted.  Psychiatric: He has a normal mood and affect. His behavior is normal. Judgment and thought content normal.    Labs reviewed: Basic Metabolic Panel: Recent Labs    04/02/18 0946 04/03/18 0407 04/04/18 0354 04/10/18 0702  NA 143 142 144 142  K 4.5 4.3 4.3 4.1  CL 108 110 112* 105  CO2 25 21* 21*  --   GLUCOSE 103* 156* 156* 99  BUN 24* 26* 27* 22*  CREATININE 1.26* 1.14 0.92 0.70  CALCIUM 10.0 9.2 9.1  --   MG  --  1.6* 2.0  --    Liver Function Tests: Recent Labs    11/05/17 0631 04/02/18 0946 04/03/18 0407  AST 26 33 27  ALT 28 40 33  ALKPHOS 86 102 90  BILITOT 0.9 0.9 0.4  PROT 7.3 7.8 6.6  ALBUMIN 3.6 4.3 3.5   No results for input(s): LIPASE, AMYLASE in the last 8760 hours. No results for input(s): AMMONIA in the last 8760 hours. CBC: Recent Labs    05/26/17 1351  11/04/17 1330 11/05/17 0631 04/02/18 0946 04/03/18 0407 04/10/18 0702  WBC 8.9   < > 7.9 11.3* 7.1 9.0  --   NEUTROABS 6.4  --  5.8  --  5.1  --   --   HGB 14.0   < > 14.9 14.2 14.1 13.4 13.9  HCT 41.1   < > 43.1 41.3 42.6 40.8 41.0  MCV 88   < > 87.4 85.7 89.7 88.5  --   PLT 263   < > 182 213 275 287  --    < > = values in this interval not displayed.   Cardiac  Enzymes: No results for input(s): CKTOTAL, CKMB, CKMBINDEX, TROPONINI in the last 8760 hours. BNP: Invalid input(s): POCBNP Lab Results  Component Value Date   HGBA1C 5.2 05/12/2014   Lab Results  Component Value Date   TSH 0.682 04/02/2018   No results found for: VITAMINB12 No results found for: FOLATE No results found for: IRON, TIBC, FERRITIN  Imaging and Procedures obtained prior to SNF admission: No results found.  Assessment/Plan   ICD-10-CM   1. Recurrent UTI N39.0   2. Physical deconditioning R53.81   3. MS (multiple sclerosis) (HCC) G35   4. Muscle spasticity M62.838    2/2 #2  5. Urinary retention R33.9   6. Gastroesophageal reflux disease, esophagitis presence not specified K21.9     Nursing to assist with in and out caths  Cont current meds as ordered  No air mattress - use regular mattress as pt is OOB  PT/OT as ordered  GOAL: short term rehab and d/c home when medically appropriate. Communicated with pt and nursing.   Labs/tests ordered: urine cx and s   Jaiyana Canale S. Ancil Linsey  [facility] [address] [place], [place] [address] [phone] Cell (Monday-Friday 8 AM - 5 PM) [phone] After 5 PM and follow prompts

## 2018-04-21 ENCOUNTER — Non-Acute Institutional Stay (SKILLED_NURSING_FACILITY): Payer: Medicare Other | Admitting: Adult Health

## 2018-04-21 ENCOUNTER — Encounter: Payer: Self-pay | Admitting: Adult Health

## 2018-04-21 DIAGNOSIS — R339 Retention of urine, unspecified: Secondary | ICD-10-CM | POA: Diagnosis not present

## 2018-04-21 DIAGNOSIS — G35 Multiple sclerosis: Secondary | ICD-10-CM

## 2018-04-21 DIAGNOSIS — R5381 Other malaise: Secondary | ICD-10-CM | POA: Diagnosis not present

## 2018-04-21 NOTE — Progress Notes (Signed)
Location:   [facility] Room Number: 130 A Place of Service:  SNF (31)   CODE STATUS: Full Code  No Known Allergies  Chief Complaint  Patient presents with  . Medical Management of Chronic Issues    MS; urine retention; physical deconditioning. Weekly follow up for first 30 days post hospitalization.     HPI:  He is a 67 year old short term resident of this facility being seen for the management of his chronic illnesses: MS urine retention and physical deconditioning. He continues to work with therapy. He denies any pain management issues; no changes in appetite; no known fevers. There are no nursing concerns at this time.   Past Medical History:  Diagnosis Date  . GERD (gastroesophageal reflux disease)    had on- off chest pain, symptoms resolved with Prilosec 11/2010  . Lower urinary tract symptoms (LUTS)    Dr Patsi Sears  . MS (multiple sclerosis) (HCC) 2005  . Testicular hypogonadism   . Urge incontinence of urine   . Urolithiasis 2011    Past Surgical History:  Procedure Laterality Date  . APPENDECTOMY      ruptured 03-2010  . VASECTOMY      Social History   Socioeconomic History  . Marital status: Married    Spouse name: Not on file  . Number of children: 3  . Years of education: Not on file  . Highest education level: Not on file  Occupational History  . Occupation: Occupation: independent Manufacturing engineer: [employer]  Social Needs  . Financial resource strain: Not on file  . Food insecurity:    Worry: Not on file    Inability: Not on file  . Transportation needs:    Medical: Not on file    Non-medical: Not on file  Tobacco Use  . Smoking status: Never Smoker  . Smokeless tobacco: Never Used  Substance and Sexual Activity  . Alcohol use: No    Alcohol/week: 0.0 oz    Comment: rare  . Drug use: No  . Sexual activity: Not on file  Lifestyle  . Physical activity:    Days per week: Not on file   Minutes per session: Not on file  . Stress: Not on file  Relationships  . Social connections:    Talks on phone: Not on file    Gets together: Not on file    Attends religious service: Not on file    Active member of club or organization: Not on file    Attends meetings of clubs or organizations: Not on file    Relationship status: Not on file  . Intimate partner violence:    Fear of current or ex partner: Not on file    Emotionally abused: Not on file    Physically abused: Not on file    Forced sexual activity: Not on file  Other Topics Concern  . Not on file  Social History Narrative  . Not on file   Family History  Problem Relation Age of Onset  . Heart attack Mother   . Brain cancer Father       VITAL SIGNS BP 138/80   Pulse 80   Temp 98.7 F (37.1 C)   Resp 16   Ht 6' (1.829 m)   Wt 193 lb 1.6 oz (87.6 kg)   SpO2 97%   BMI 26.19 kg/m   Outpatient Encounter Medications as of 04/21/2018  Medication Sig  . baclofen (LIORESAL) 10 MG  tablet Take 1 tablet (10 mg total) by mouth 3 (three) times daily.  Marland Kitchen ENSURE (ENSURE) Take 237 mLs daily by mouth.  Marland Kitchen LACTOBACILLUS PO Take 1 capsule by mouth 2 (two) times daily. X 14 days  . levofloxacin (LEVAQUIN) 500 MG tablet Take 500 mg by mouth daily. X 7 days  . ocrelizumab (OCREVUS) 300 MG/10ML injection Inject 300 mg every 6 (six) months into the vein.  Marland Kitchen omeprazole (PRILOSEC) 20 MG capsule Take 20 mg daily as needed by mouth (for acid reflex).   . tamsulosin (FLOMAX) 0.4 MG CAPS capsule Take 0.8 mg by mouth at bedtime.   No facility-administered encounter medications on file as of 04/21/2018.      SIGNIFICANT DIAGNOSTIC EXAMS  PREVIOUS   04-02-18: chest x-ray: No active disease. Minimal basilar atelectasis.   NO NEW EXAMS  LABS REVIEWED PREVIOUS  04-02-18: wbc 7.1; hgb 14.1; hct 42.6; mcv 89.7 plt 275; glucose 103; bun 24; creat 1.26; k+ 4.5; an++ 143; ca 10.0; liver normal albumin 4.3; tsh 0.628 04-04-18: glucose  156; bun 27; creat 0.92; k+ 4.3; na++144; ca 9.1; mag 2.0   NO NEW LABS   Review of Systems  Constitutional: Negative for malaise/fatigue.  Respiratory: Negative for cough and shortness of breath.   Cardiovascular: Negative for chest pain, palpitations and leg swelling.  Gastrointestinal: Negative for abdominal pain, constipation and heartburn.  Musculoskeletal: Negative for back pain, joint pain and myalgias.  Skin: Negative.   Neurological: Negative for dizziness.  Psychiatric/Behavioral: The patient is not nervous/anxious.       Physical Exam  Constitutional: He is oriented to person, place, and time. He appears well-developed and well-nourished. No distress.  Neck: No thyromegaly present.  Cardiovascular: Normal rate, regular rhythm, normal heart sounds and intact distal pulses.  Pulmonary/Chest: Effort normal and breath sounds normal. No respiratory distress.  Abdominal: Soft. Bowel sounds are normal.  Genitourinary:  Genitourinary Comments: Self I/O cath   Musculoskeletal:  Little movement of lower extremities   Lymphadenopathy:    He has no cervical adenopathy.  Neurological: He is alert and oriented to person, place, and time.  Skin: Skin is warm and dry. He is not diaphoretic.  Psychiatric: He has a normal mood and affect.    ASSESSMENT/ PLAN:  TODAY:   1. Urine retention: is performing self I/O caths; no changes will monitor  Will continue flomax 0.8 mg nightly   3. MS: is without change: he has completed his steroids. Will continue Ocrevus 300 mg IM with neurology every 6 months; will continue baclofen  10 mg three times daily   4. Physical deconditioning: without change: will continue therapy as directed to improve upon his level of independence with his adls     MD is aware of resident's narcotic use and is in agreement with current plan of care. We will attempt to wean resident as apropriate   Synthia Innocent NP [facility]  Contact  [phone] Monday through Friday 8am- 5pm  After hours call [phone]

## 2018-04-23 ENCOUNTER — Non-Acute Institutional Stay (SKILLED_NURSING_FACILITY): Payer: Medicare Other | Admitting: Adult Health

## 2018-04-23 DIAGNOSIS — G35 Multiple sclerosis: Secondary | ICD-10-CM

## 2018-04-23 DIAGNOSIS — R5381 Other malaise: Secondary | ICD-10-CM

## 2018-04-24 ENCOUNTER — Encounter: Payer: Self-pay | Admitting: Adult Health

## 2018-04-24 NOTE — Progress Notes (Signed)
Location:   [facility] Room Number: [phone] Place of Service:  SNF (31)   CODE STATUS: full code  No Known Allergies  Chief Complaint  Patient presents with  . Acute Visit    care plan meeting     HPI:  He and his family are asking for a care plan meeting. He is wanting to go home now. He had not done a home visit as staff was concerned that he would not return to the facility. We have had a prolonged discussion about his physical needs at home. He is continuing to participate in therapy. He is willing to do a home visit to ensure that he will do well at home. Therapy is aware and is in agreement as well.    Past Medical History:  Diagnosis Date  . GERD (gastroesophageal reflux disease)    had on- off chest pain, symptoms resolved with Prilosec 11/2010  . Lower urinary tract symptoms (LUTS)    Dr Patsi Sears  . MS (multiple sclerosis) (HCC) 2005  . Testicular hypogonadism   . Urge incontinence of urine   . Urolithiasis 2011    Past Surgical History:  Procedure Laterality Date  . APPENDECTOMY      ruptured 03-2010  . VASECTOMY      Social History   Socioeconomic History  . Marital status: Married    Spouse name: Not on file  . Number of children: 3  . Years of education: Not on file  . Highest education level: Not on file  Occupational History  . Occupation: Occupation: independent Manufacturing engineer: [employer]  Social Needs  . Financial resource strain: Not on file  . Food insecurity:    Worry: Not on file    Inability: Not on file  . Transportation needs:    Medical: Not on file    Non-medical: Not on file  Tobacco Use  . Smoking status: Never Smoker  . Smokeless tobacco: Never Used  Substance and Sexual Activity  . Alcohol use: No    Alcohol/week: 0.0 oz    Comment: rare  . Drug use: No  . Sexual activity: Not on file  Lifestyle  . Physical activity:    Days per week: Not on file    Minutes per session:  Not on file  . Stress: Not on file  Relationships  . Social connections:    Talks on phone: Not on file    Gets together: Not on file    Attends religious service: Not on file    Active member of club or organization: Not on file    Attends meetings of clubs or organizations: Not on file    Relationship status: Not on file  . Intimate partner violence:    Fear of current or ex partner: Not on file    Emotionally abused: Not on file    Physically abused: Not on file    Forced sexual activity: Not on file  Other Topics Concern  . Not on file  Social History Narrative  . Not on file   Family History  Problem Relation Age of Onset  . Heart attack Mother   . Brain cancer Father       VITAL SIGNS There were no vitals taken for this visit.  Outpatient Encounter Medications as of 04/23/2018  Medication Sig  . baclofen (LIORESAL) 10 MG tablet Take 1 tablet (10 mg total) by mouth 3 (three) times daily.  Marland Kitchen ENSURE (  ENSURE) Take 237 mLs daily by mouth.  Marland Kitchen LACTOBACILLUS PO Take 1 capsule by mouth 2 (two) times daily. X 14 days  . levofloxacin (LEVAQUIN) 500 MG tablet Take 500 mg by mouth daily. X 7 days  . ocrelizumab (OCREVUS) 300 MG/10ML injection Inject 300 mg every 6 (six) months into the vein.  Marland Kitchen omeprazole (PRILOSEC) 20 MG capsule Take 20 mg daily as needed by mouth (for acid reflex).   . tamsulosin (FLOMAX) 0.4 MG CAPS capsule Take 0.8 mg by mouth at bedtime.   No facility-administered encounter medications on file as of 04/23/2018.      SIGNIFICANT DIAGNOSTIC EXAMS  PREVIOUS   04-02-18: chest x-ray: No active disease. Minimal basilar atelectasis.   NO NEW EXAMS  LABS REVIEWED PREVIOUS  04-02-18: wbc 7.1; hgb 14.1; hct 42.6; mcv 89.7 plt 275; glucose 103; bun 24; creat 1.26; k+ 4.5; an++ 143; ca 10.0; liver normal albumin 4.3; tsh 0.628 04-04-18: glucose 156; bun 27; creat 0.92; k+ 4.3; na++144; ca 9.1; mag 2.0   NO NEW LABS    Review of Systems  Constitutional:  Negative for malaise/fatigue.  Respiratory: Negative for cough and shortness of breath.   Cardiovascular: Negative for chest pain, palpitations and leg swelling.  Gastrointestinal: Negative for abdominal pain, constipation and heartburn.  Musculoskeletal: Negative for back pain, joint pain and myalgias.  Skin: Negative.   Neurological: Negative for dizziness.  Psychiatric/Behavioral: The patient is not nervous/anxious.      Physical Exam  Constitutional: He is oriented to person, place, and time. He appears well-developed and well-nourished. No distress.  Neck: No thyromegaly present.  Cardiovascular: Normal rate, regular rhythm, normal heart sounds and intact distal pulses.  Pulmonary/Chest: Effort normal and breath sounds normal. No stridor. No respiratory distress.  Abdominal: Soft. Bowel sounds are normal. He exhibits no distension. There is no tenderness.  Genitourinary:  Genitourinary Comments: Self I/O cath   Musculoskeletal: He exhibits no edema.  Little movement of lower extremities   Lymphadenopathy:    He has no cervical adenopathy.  Neurological: He is alert and oriented to person, place, and time.  Skin: Skin is warm and dry. He is not diaphoretic.  Psychiatric: He has a normal mood and affect.    ASSESSMENT/ PLAN:  TODAY:   1. MS 2. Physical deconditioning  At this time will continue his current plan of care Will setup a home visit  Will monitor his status.   MD is aware of resident's narcotic use and is in agreement with current plan of care. We will attempt to wean resident as apropriate   Synthia Innocent NP [facility]  Contact [phone] Monday through Friday 8am- 5pm  After hours call [phone]

## 2018-04-28 ENCOUNTER — Encounter: Payer: Self-pay | Admitting: Adult Health

## 2018-04-28 ENCOUNTER — Non-Acute Institutional Stay (SKILLED_NURSING_FACILITY): Payer: Medicare Other | Admitting: Adult Health

## 2018-04-28 DIAGNOSIS — G35 Multiple sclerosis: Secondary | ICD-10-CM

## 2018-04-28 DIAGNOSIS — R339 Retention of urine, unspecified: Secondary | ICD-10-CM

## 2018-04-28 DIAGNOSIS — R5381 Other malaise: Secondary | ICD-10-CM

## 2018-04-28 NOTE — Progress Notes (Signed)
Location:   [facility] Room Number: 130 A Place of Service:  SNF (31)    CODE STATUS: Full code  No Known Allergies  Chief Complaint  Patient presents with  . Discharge Note    Discharging to Home on 04/29/18    HPI:  He is being discharged to home with home health for pt/ot/rn/cna/sw. He will not need dme. He will need his prescriptions written and will need to follow up with his medical provider. His home visit went well and he is ready for discharge to home. He had been admitted to this facility for short term rehab following an admission for exacerbation os his MS.    Past Medical History:  Diagnosis Date  . GERD (gastroesophageal reflux disease)    had on- off chest pain, symptoms resolved with Prilosec 11/2010  . Lower urinary tract symptoms (LUTS)    Dr Patsi Sears  . MS (multiple sclerosis) (HCC) 2005  . Testicular hypogonadism   . Urge incontinence of urine   . Urolithiasis 2011    Past Surgical History:  Procedure Laterality Date  . APPENDECTOMY      ruptured 03-2010  . VASECTOMY      Social History   Socioeconomic History  . Marital status: Married    Spouse name: Not on file  . Number of children: 3  . Years of education: Not on file  . Highest education level: Not on file  Occupational History  . Occupation: Occupation: independent Manufacturing engineer: [employer]  Social Needs  . Financial resource strain: Not on file  . Food insecurity:    Worry: Not on file    Inability: Not on file  . Transportation needs:    Medical: Not on file    Non-medical: Not on file  Tobacco Use  . Smoking status: Never Smoker  . Smokeless tobacco: Never Used  Substance and Sexual Activity  . Alcohol use: No    Alcohol/week: 0.0 oz    Comment: rare  . Drug use: No  . Sexual activity: Not on file  Lifestyle  . Physical activity:    Days per week: Not on file    Minutes per session: Not on file  . Stress: Not on file    Relationships  . Social connections:    Talks on phone: Not on file    Gets together: Not on file    Attends religious service: Not on file    Active member of club or organization: Not on file    Attends meetings of clubs or organizations: Not on file    Relationship status: Not on file  . Intimate partner violence:    Fear of current or ex partner: Not on file    Emotionally abused: Not on file    Physically abused: Not on file    Forced sexual activity: Not on file  Other Topics Concern  . Not on file  Social History Narrative  . Not on file   Family History  Problem Relation Age of Onset  . Heart attack Mother   . Brain cancer Father     VITAL SIGNS BP 132/81   Pulse (!) 105   Temp 98.6 F (37 C)   Resp 20   Ht 6' (1.829 m)   Wt 193 lb 1.6 oz (87.6 kg)   SpO2 96%   BMI 26.19 kg/m   Patient's Medications  New Prescriptions   No medications on file  Previous Medications  BACLOFEN (LIORESAL) 10 MG TABLET    Take 1 tablet (10 mg total) by mouth 3 (three) times daily.   ENSURE (ENSURE)    Take 237 mLs daily by mouth.   LACTOBACILLUS PO    Take 1 capsule by mouth 2 (two) times daily. X 14 days   OCRELIZUMAB (OCREVUS) 300 MG/10ML INJECTION    Inject 300 mg every 6 (six) months into the vein.   OMEPRAZOLE (PRILOSEC) 20 MG CAPSULE    Take 20 mg daily as needed by mouth (for acid reflex).    TAMSULOSIN (FLOMAX) 0.4 MG CAPS CAPSULE    Take 0.8 mg by mouth at bedtime.  Modified Medications   No medications on file  Discontinued Medications   No medications on file     SIGNIFICANT DIAGNOSTIC EXAMS   PREVIOUS   04-02-18: chest x-ray: No active disease. Minimal basilar atelectasis.   NO NEW EXAMS  LABS REVIEWED PREVIOUS  04-02-18: wbc 7.1; hgb 14.1; hct 42.6; mcv 89.7 plt 275; glucose 103; bun 24; creat 1.26; k+ 4.5; an++ 143; ca 10.0; liver normal albumin 4.3; tsh 0.628 04-04-18: glucose 156; bun 27; creat 0.92; k+ 4.3; na++144; ca 9.1; mag 2.0   NO NEW LABS     Review of Systems  Constitutional: Negative for malaise/fatigue.  Respiratory: Negative for cough and shortness of breath.   Cardiovascular: Negative for chest pain, palpitations and leg swelling.  Gastrointestinal: Negative for abdominal pain, constipation and heartburn.  Musculoskeletal: Negative for back pain, joint pain and myalgias.  Skin: Negative.   Neurological: Negative for dizziness.  Psychiatric/Behavioral: The patient is not nervous/anxious.     Physical Exam  Constitutional: He is oriented to person, place, and time. He appears well-developed and well-nourished. No distress.  Neck: No thyromegaly present.  Cardiovascular: Normal rate, regular rhythm, normal heart sounds and intact distal pulses.  Pulmonary/Chest: Effort normal and breath sounds normal.  Abdominal: Soft. Bowel sounds are normal. He exhibits no distension. There is no tenderness.  Genitourinary:  Genitourinary Comments: Self I/O cath  Musculoskeletal: He exhibits no edema.  Little movement of his lower extremities  Lymphadenopathy:    He has no cervical adenopathy.  Neurological: He is alert and oriented to person, place, and time.  Skin: Skin is warm and dry. He is not diaphoretic.  Psychiatric: He has a normal mood and affect.    ASSESSMENT/ PLAN:  Patient is being discharged with the following home health services:  Pt/ot/rn/cna/sw: to evaluate and treat as indicated for gait balance strength adl training medication management adl care and community outreach.   Patient is being discharged with the following durable medical equipment:  None needed   Patient has been advised to f/u with their PCP in 1-2 weeks to bring them up to date on their rehab stay.  Social services at facility was responsible for arranging this appointment.  Pt was provided with a 30 day supply of prescriptions for medications and refills must be obtained from their PCP.  For controlled substances, a more limited supply may  be provided adequate until PCP appointment only.   A 30 day supply of prescription medications written as list above  Time spent with patient 40 minutes: discussed home health expectations; dme needs (none); medications. Verbalized understanding.    Synthia Innocent NP [facility]  Contact [phone] Monday through Friday 8am- 5pm  After hours call [phone]

## 2018-04-30 ENCOUNTER — Ambulatory Visit: Payer: Self-pay | Admitting: Neurology

## 2018-05-01 DIAGNOSIS — G35 Multiple sclerosis: Secondary | ICD-10-CM | POA: Diagnosis not present

## 2018-05-01 DIAGNOSIS — Z87440 Personal history of urinary (tract) infections: Secondary | ICD-10-CM | POA: Diagnosis not present

## 2018-05-01 DIAGNOSIS — Z466 Encounter for fitting and adjustment of urinary device: Secondary | ICD-10-CM | POA: Diagnosis not present

## 2018-05-01 DIAGNOSIS — N3941 Urge incontinence: Secondary | ICD-10-CM | POA: Diagnosis not present

## 2018-05-01 DIAGNOSIS — Z79899 Other long term (current) drug therapy: Secondary | ICD-10-CM | POA: Diagnosis not present

## 2018-05-01 DIAGNOSIS — K219 Gastro-esophageal reflux disease without esophagitis: Secondary | ICD-10-CM | POA: Diagnosis not present

## 2018-05-01 DIAGNOSIS — N401 Enlarged prostate with lower urinary tract symptoms: Secondary | ICD-10-CM | POA: Diagnosis not present

## 2018-05-05 ENCOUNTER — Telehealth: Payer: Self-pay | Admitting: Neurology

## 2018-05-05 ENCOUNTER — Telehealth: Payer: Self-pay | Admitting: Internal Medicine

## 2018-05-05 DIAGNOSIS — Z87440 Personal history of urinary (tract) infections: Secondary | ICD-10-CM | POA: Diagnosis not present

## 2018-05-05 DIAGNOSIS — K219 Gastro-esophageal reflux disease without esophagitis: Secondary | ICD-10-CM | POA: Diagnosis not present

## 2018-05-05 DIAGNOSIS — G35 Multiple sclerosis: Secondary | ICD-10-CM | POA: Diagnosis not present

## 2018-05-05 DIAGNOSIS — N401 Enlarged prostate with lower urinary tract symptoms: Secondary | ICD-10-CM | POA: Diagnosis not present

## 2018-05-05 DIAGNOSIS — Z466 Encounter for fitting and adjustment of urinary device: Secondary | ICD-10-CM | POA: Diagnosis not present

## 2018-05-05 DIAGNOSIS — N3941 Urge incontinence: Secondary | ICD-10-CM | POA: Diagnosis not present

## 2018-05-05 NOTE — Telephone Encounter (Signed)
Ginger with [facility] is calling to get a start of care for home health with nursing.  Nursing is 1 time a week for 1 week, 2 times a week for 2 weeks, 1 time a week for 6 weeks and 3 PRN visits as needed. Focus of care is for MS and also assisting with catheter supplies. Patient is at home.Therapy will call back with their treatment.

## 2018-05-05 NOTE — Telephone Encounter (Signed)
Please advise- you have not seen Pt since 2015- hosp scheduled f/u with you on 05/11/2018.

## 2018-05-05 NOTE — Telephone Encounter (Signed)
Unable to provide orders, not seen in more than 3 years

## 2018-05-05 NOTE — Telephone Encounter (Signed)
Copied from CRM #[id number]. Topic: Quick Communication - See Telephone Encounter >> May 05, 2018  9:40 AM Windy Kalata, NT wrote: CRM for notification. See Telephone encounter for: 05/05/18.  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 Monitoring back side for skin break down and catheter supplies. Patient had recent UTI and also has MS.   Ginger [phone]

## 2018-05-05 NOTE — Telephone Encounter (Signed)
LMOM informing Stephen Wade that PCP unable to give verbal orders- Pt has not been seen since 2015. Pt is actually not consider to be [name] Pt at this time d/t office policy- he will need to re-establish. Hosp scheduled f/u for 05/11/2018 informed if Pt shows for that visit [name] would then be willing to sign orders. Instructed her to call if questions/concerns.

## 2018-05-05 NOTE — Telephone Encounter (Signed)
Spoke with Ginger and explained RAS will sign PT, OT, HHA, SW orders.  He is not able to manage wound care, and pcp should manage any BP issues.  O/W, RAS will sign orders/fim

## 2018-05-06 DIAGNOSIS — N401 Enlarged prostate with lower urinary tract symptoms: Secondary | ICD-10-CM | POA: Diagnosis not present

## 2018-05-06 DIAGNOSIS — Z466 Encounter for fitting and adjustment of urinary device: Secondary | ICD-10-CM | POA: Diagnosis not present

## 2018-05-06 DIAGNOSIS — G35 Multiple sclerosis: Secondary | ICD-10-CM | POA: Diagnosis not present

## 2018-05-06 DIAGNOSIS — Z87440 Personal history of urinary (tract) infections: Secondary | ICD-10-CM | POA: Diagnosis not present

## 2018-05-06 DIAGNOSIS — K219 Gastro-esophageal reflux disease without esophagitis: Secondary | ICD-10-CM | POA: Diagnosis not present

## 2018-05-06 DIAGNOSIS — N3941 Urge incontinence: Secondary | ICD-10-CM | POA: Diagnosis not present

## 2018-05-07 DIAGNOSIS — G35 Multiple sclerosis: Secondary | ICD-10-CM | POA: Diagnosis not present

## 2018-05-07 DIAGNOSIS — K219 Gastro-esophageal reflux disease without esophagitis: Secondary | ICD-10-CM | POA: Diagnosis not present

## 2018-05-07 DIAGNOSIS — N401 Enlarged prostate with lower urinary tract symptoms: Secondary | ICD-10-CM | POA: Diagnosis not present

## 2018-05-07 DIAGNOSIS — Z87440 Personal history of urinary (tract) infections: Secondary | ICD-10-CM | POA: Diagnosis not present

## 2018-05-07 DIAGNOSIS — Z466 Encounter for fitting and adjustment of urinary device: Secondary | ICD-10-CM | POA: Diagnosis not present

## 2018-05-07 DIAGNOSIS — N3941 Urge incontinence: Secondary | ICD-10-CM | POA: Diagnosis not present

## 2018-05-07 NOTE — Telephone Encounter (Signed)
[facility] [phone] called to advise patient was OT eval only  FYI

## 2018-05-07 NOTE — Telephone Encounter (Addendum)
noted 

## 2018-05-11 ENCOUNTER — Ambulatory Visit: Payer: Medicare Other | Admitting: Internal Medicine

## 2018-05-12 DIAGNOSIS — Z466 Encounter for fitting and adjustment of urinary device: Secondary | ICD-10-CM | POA: Diagnosis not present

## 2018-05-12 DIAGNOSIS — N3941 Urge incontinence: Secondary | ICD-10-CM | POA: Diagnosis not present

## 2018-05-12 DIAGNOSIS — Z87440 Personal history of urinary (tract) infections: Secondary | ICD-10-CM | POA: Diagnosis not present

## 2018-05-12 DIAGNOSIS — K219 Gastro-esophageal reflux disease without esophagitis: Secondary | ICD-10-CM | POA: Diagnosis not present

## 2018-05-12 DIAGNOSIS — N401 Enlarged prostate with lower urinary tract symptoms: Secondary | ICD-10-CM | POA: Diagnosis not present

## 2018-05-12 DIAGNOSIS — G35 Multiple sclerosis: Secondary | ICD-10-CM | POA: Diagnosis not present

## 2018-05-14 DIAGNOSIS — N401 Enlarged prostate with lower urinary tract symptoms: Secondary | ICD-10-CM | POA: Diagnosis not present

## 2018-05-14 DIAGNOSIS — G35 Multiple sclerosis: Secondary | ICD-10-CM | POA: Diagnosis not present

## 2018-05-14 DIAGNOSIS — K219 Gastro-esophageal reflux disease without esophagitis: Secondary | ICD-10-CM | POA: Diagnosis not present

## 2018-05-14 DIAGNOSIS — Z466 Encounter for fitting and adjustment of urinary device: Secondary | ICD-10-CM | POA: Diagnosis not present

## 2018-05-14 DIAGNOSIS — Z87440 Personal history of urinary (tract) infections: Secondary | ICD-10-CM | POA: Diagnosis not present

## 2018-05-14 DIAGNOSIS — N3941 Urge incontinence: Secondary | ICD-10-CM | POA: Diagnosis not present

## 2018-05-20 DIAGNOSIS — Z87440 Personal history of urinary (tract) infections: Secondary | ICD-10-CM | POA: Diagnosis not present

## 2018-05-20 DIAGNOSIS — K219 Gastro-esophageal reflux disease without esophagitis: Secondary | ICD-10-CM | POA: Diagnosis not present

## 2018-05-20 DIAGNOSIS — Z466 Encounter for fitting and adjustment of urinary device: Secondary | ICD-10-CM | POA: Diagnosis not present

## 2018-05-20 DIAGNOSIS — N3941 Urge incontinence: Secondary | ICD-10-CM | POA: Diagnosis not present

## 2018-05-20 DIAGNOSIS — N401 Enlarged prostate with lower urinary tract symptoms: Secondary | ICD-10-CM | POA: Diagnosis not present

## 2018-05-20 DIAGNOSIS — G35 Multiple sclerosis: Secondary | ICD-10-CM | POA: Diagnosis not present

## 2018-05-21 DIAGNOSIS — Z466 Encounter for fitting and adjustment of urinary device: Secondary | ICD-10-CM | POA: Diagnosis not present

## 2018-05-21 DIAGNOSIS — Z87440 Personal history of urinary (tract) infections: Secondary | ICD-10-CM | POA: Diagnosis not present

## 2018-05-21 DIAGNOSIS — N3941 Urge incontinence: Secondary | ICD-10-CM | POA: Diagnosis not present

## 2018-05-21 DIAGNOSIS — K219 Gastro-esophageal reflux disease without esophagitis: Secondary | ICD-10-CM | POA: Diagnosis not present

## 2018-05-21 DIAGNOSIS — G35 Multiple sclerosis: Secondary | ICD-10-CM | POA: Diagnosis not present

## 2018-05-21 DIAGNOSIS — N401 Enlarged prostate with lower urinary tract symptoms: Secondary | ICD-10-CM | POA: Diagnosis not present

## 2018-05-22 ENCOUNTER — Encounter

## 2018-05-22 ENCOUNTER — Ambulatory Visit (INDEPENDENT_AMBULATORY_CARE_PROVIDER_SITE_OTHER): Payer: Medicare Other | Admitting: Internal Medicine

## 2018-05-22 ENCOUNTER — Encounter: Payer: Self-pay | Admitting: Internal Medicine

## 2018-05-22 VITALS — BP 123/68 | HR 82 | Temp 97.6°F | Resp 16

## 2018-05-22 DIAGNOSIS — E041 Nontoxic single thyroid nodule: Secondary | ICD-10-CM

## 2018-05-22 DIAGNOSIS — F339 Major depressive disorder, recurrent, unspecified: Secondary | ICD-10-CM | POA: Diagnosis not present

## 2018-05-22 DIAGNOSIS — Z87440 Personal history of urinary (tract) infections: Secondary | ICD-10-CM | POA: Diagnosis not present

## 2018-05-22 DIAGNOSIS — L98429 Non-pressure chronic ulcer of back with unspecified severity: Secondary | ICD-10-CM

## 2018-05-22 DIAGNOSIS — G35 Multiple sclerosis: Secondary | ICD-10-CM | POA: Diagnosis not present

## 2018-05-22 NOTE — Patient Instructions (Signed)
Come back in 3 months for a checkup  Please call the wound care center, if you need a referral let me know  We will schedule a thyroid ultrasound  Okay to go back on Flomax  See a  Counselor    SUICIDE PREVENTION:  Find Help 24/7, [place] Call our 24-hour HelpLine at [phone] or [phone]    National Hopeline Network: 1-800-SUICIDE   The National Suicide Prevention Lifeline: [employer]

## 2018-05-22 NOTE — Progress Notes (Signed)
Subjective:    Patient ID: Stephen Wade, male    DOB: [birth year], 67 y.o.   MRN: [id number]  DOS:  05/22/2018 Type of visit - description : New patient, to reestablish, here w/  Wife Interval history:  Admitted to the hospital 04/02/2018, discharge 4 days later, declined SNF;  initially went home , then went back to the ER and eventually agreed to go to a SNF. Has been home for the last 2 weeks   He was admitted with generalized weakness, unable to transfer. He has some right-sided weakness, sx were classic for his MS flareups. Pt was seen along with neurology, improved with Solu-Medrol. Patient declined a brain MRI  Labs: At some point creatinine was 1.26, upon discharge 0.7. CBC was normal Urinalysis show WBCs TNC, no culture done, apparently not treated.  Patient self catheterize Chest x-ray no active disease  Review of Systems Since he left the hospital and the SNF he is back home. Things are getting back to baseline. Denies fever, chills. Appetite is good. I asked about depression.  He and the wife said that he is very frustrated about MS and self-catheterization.  Very irritable. Ask about suicide ideas, he said he has them every day. Currently has a decubital ulcer.   Past Medical History:  Diagnosis Date  . GERD (gastroesophageal reflux disease)    had on- off chest pain, symptoms resolved with Prilosec 11/2010  . Lower urinary tract symptoms (LUTS)    Dr Patsi Sears  . MS (multiple sclerosis) (HCC) 2005  . Testicular hypogonadism   . Urge incontinence of urine   . Urolithiasis 2011    Past Surgical History:  Procedure Laterality Date  . APPENDECTOMY      ruptured 03-2010  . VASECTOMY      Social History   Socioeconomic History  . Marital status: Married    Spouse name: Not on file  . Number of children: 3  . Years of education: Not on file  . Highest education level: Not on file  Occupational History  . Occupation: still works some from home   Employer: [employer]  Social Needs  . Financial resource strain: Not on file  . Food insecurity:    Worry: Not on file    Inability: Not on file  . Transportation needs:    Medical: Not on file    Non-medical: Not on file  Tobacco Use  . Smoking status: Former Games developer  . Smokeless tobacco: Never Used  . Tobacco comment: 3 ppd, quit 1999  Substance and Sexual Activity  . Alcohol use: No    Alcohol/week: 0.0 oz    Comment: rare  . Drug use: No  . Sexual activity: Not on file  Lifestyle  . Physical activity:    Days per week: Not on file    Minutes per session: Not on file  . Stress: Not on file  Relationships  . Social connections:    Talks on phone: Not on file    Gets together: Not on file    Attends religious service: Not on file    Active member of club or organization: Not on file    Attends meetings of clubs or organizations: Not on file    Relationship status: Not on file  . Intimate partner violence:    Fear of current or ex partner: Not on file    Emotionally abused: Not on file    Physically abused: Not on file    Forced sexual activity: Not  on file  Other Topics Concern  . Not on file  Social History Narrative   Lives w/ wife       Allergies as of 05/22/2018   No Known Allergies     Medication List        Accurate as of 05/22/18 11:59 PM. Always use your most recent med list.          baclofen 10 MG tablet Commonly known as:  LIORESAL Take 1 tablet (10 mg total) by mouth 3 (three) times daily.   ENSURE Take 237 mLs daily by mouth.   OCREVUS 300 MG/10ML injection Generic drug:  ocrelizumab Inject 300 mg every 6 (six) months into the vein.   omeprazole 20 MG capsule Commonly known as:  PRILOSEC Take 20 mg daily as needed by mouth (for acid reflex).   tamsulosin 0.4 MG Caps capsule Commonly known as:  FLOMAX Take 0.8 mg by mouth at bedtime.          Objective:   Physical Exam  Skin:      BP 123/68 (BP Location: Left Arm,  Patient Position: Sitting, Cuff Size: Normal)   Pulse 82   Temp 97.6 F (36.4 C) (Oral)   Resp 16   SpO2 98%  General:   Well developed, well nourished . NAD.  HEENT:  Normocephalic . Face symmetric, atraumatic. Neck: Thyroid gland enlarged on the right side, very noticeable on palpation, not tender, not nodular, smooth enlargement. Lungs:  CTA B Normal respiratory effort, no intercostal retractions, no accessory muscle use. Heart: RRR,  no murmur.  No pretibial edema bilaterally  Neurologic:  alert & oriented X3.  Speech slightly limited.  Sits in a scooter, unable to stand up, he did turn to the right and let me check his decubitus ulcer. Psych--  Cognition and judgment appear intact.  Cooperative with normal attention span and concentration.  Behavior appropriate. No anxious or depressed appearing.      Assessment & Plan:   Assessment Multiple sclerosis, Dr Epimenio Foot  Decubitus ulcer on and off, buttock  Depression Recurrent UTIs, history of urolithiasis, BPH: Self cath Vitamin D deficiency   Plan: MS: Per neurology Decubitus ulcer: No evidence of infection at this point.  Recommend frequent position changes, ship skin, pt plans to call the wound care center.  I agree.  Call if fever, chills or discharge Depression: Patient is quite frustrated about his medical conditions, I totally understand.  He has daily suicidal ideas, he owns a gun.  Wife has control of the gun.  Patient states he will not act upon those thoughts. I counseled him to the best of my ability, recommend to consider medication, wife states he will never do. I then gave him information about suicide hotline, also encourage counseling.  Information provided. BPH: Self DC Flomax lately, since then he has been unable to use a condom catheter and is completely dependent on self-catheterization, okay to go back on Flomax. Thyroid nodule: she exam, new issue,  check ultrasound RTC 3 months

## 2018-05-22 NOTE — Progress Notes (Signed)
Pre visit review using our clinic review tool, if applicable. No additional management support is needed unless otherwise documented below in the visit note. 

## 2018-05-24 DIAGNOSIS — Z09 Encounter for follow-up examination after completed treatment for conditions other than malignant neoplasm: Secondary | ICD-10-CM | POA: Insufficient documentation

## 2018-05-24 DIAGNOSIS — F339 Major depressive disorder, recurrent, unspecified: Secondary | ICD-10-CM | POA: Insufficient documentation

## 2018-05-24 NOTE — Assessment & Plan Note (Signed)
MS: Per neurology Decubitus ulcer: No evidence of infection at this point.  Recommend frequent position changes, ship skin, pt plans to call the wound care center.  I agree.  Call if fever, chills or discharge Depression: Patient is quite frustrated about his medical conditions, I totally understand.  He has daily suicidal ideas, he owns a gun.  Wife has control of the gun.  Patient states he will not act upon those thoughts. I counseled him to the best of my ability, recommend to consider medication, wife states he will never do. I then gave him information about suicide hotline, also encourage counseling.  Information provided. BPH: Self DC Flomax lately, since then he has been unable to use a condom catheter and is completely dependent on self-catheterization, okay to go back on Flomax. Thyroid nodule: she exam, new issue,  check ultrasound RTC 3 months

## 2018-05-25 DIAGNOSIS — K219 Gastro-esophageal reflux disease without esophagitis: Secondary | ICD-10-CM | POA: Diagnosis not present

## 2018-05-25 DIAGNOSIS — G35 Multiple sclerosis: Secondary | ICD-10-CM | POA: Diagnosis not present

## 2018-05-25 DIAGNOSIS — N3941 Urge incontinence: Secondary | ICD-10-CM | POA: Diagnosis not present

## 2018-05-25 DIAGNOSIS — Z87440 Personal history of urinary (tract) infections: Secondary | ICD-10-CM | POA: Diagnosis not present

## 2018-05-25 DIAGNOSIS — Z466 Encounter for fitting and adjustment of urinary device: Secondary | ICD-10-CM | POA: Diagnosis not present

## 2018-05-25 DIAGNOSIS — N401 Enlarged prostate with lower urinary tract symptoms: Secondary | ICD-10-CM | POA: Diagnosis not present

## 2018-05-26 ENCOUNTER — Ambulatory Visit (HOSPITAL_BASED_OUTPATIENT_CLINIC_OR_DEPARTMENT_OTHER)
Admission: RE | Admit: 2018-05-26 | Discharge: 2018-05-26 | Disposition: A | Discharge status: Home / Self Care | Payer: Medicare Other | Source: Ambulatory Visit | Attending: Internal Medicine | Admitting: Internal Medicine

## 2018-05-26 DIAGNOSIS — E049 Nontoxic goiter, unspecified: Secondary | ICD-10-CM | POA: Diagnosis not present

## 2018-05-26 DIAGNOSIS — E042 Nontoxic multinodular goiter: Secondary | ICD-10-CM | POA: Insufficient documentation

## 2018-05-26 DIAGNOSIS — E041 Nontoxic single thyroid nodule: Secondary | ICD-10-CM | POA: Diagnosis present

## 2018-05-26 NOTE — Addendum Note (Signed)
Addended byConrad Sutter D on: 05/26/2018 04:26 PM   Modules accepted: Orders

## 2018-05-28 DIAGNOSIS — N3941 Urge incontinence: Secondary | ICD-10-CM | POA: Diagnosis not present

## 2018-05-28 DIAGNOSIS — N401 Enlarged prostate with lower urinary tract symptoms: Secondary | ICD-10-CM | POA: Diagnosis not present

## 2018-05-28 DIAGNOSIS — Z87440 Personal history of urinary (tract) infections: Secondary | ICD-10-CM | POA: Diagnosis not present

## 2018-05-28 DIAGNOSIS — K219 Gastro-esophageal reflux disease without esophagitis: Secondary | ICD-10-CM | POA: Diagnosis not present

## 2018-05-28 DIAGNOSIS — Z466 Encounter for fitting and adjustment of urinary device: Secondary | ICD-10-CM | POA: Diagnosis not present

## 2018-05-28 DIAGNOSIS — G35 Multiple sclerosis: Secondary | ICD-10-CM | POA: Diagnosis not present

## 2018-06-02 DIAGNOSIS — N401 Enlarged prostate with lower urinary tract symptoms: Secondary | ICD-10-CM | POA: Diagnosis not present

## 2018-06-02 DIAGNOSIS — Z466 Encounter for fitting and adjustment of urinary device: Secondary | ICD-10-CM | POA: Diagnosis not present

## 2018-06-02 DIAGNOSIS — N3941 Urge incontinence: Secondary | ICD-10-CM | POA: Diagnosis not present

## 2018-06-02 DIAGNOSIS — Z87440 Personal history of urinary (tract) infections: Secondary | ICD-10-CM | POA: Diagnosis not present

## 2018-06-02 DIAGNOSIS — K219 Gastro-esophageal reflux disease without esophagitis: Secondary | ICD-10-CM | POA: Diagnosis not present

## 2018-06-02 DIAGNOSIS — G35 Multiple sclerosis: Secondary | ICD-10-CM | POA: Diagnosis not present

## 2018-06-03 DIAGNOSIS — Z466 Encounter for fitting and adjustment of urinary device: Secondary | ICD-10-CM | POA: Diagnosis not present

## 2018-06-03 DIAGNOSIS — K219 Gastro-esophageal reflux disease without esophagitis: Secondary | ICD-10-CM | POA: Diagnosis not present

## 2018-06-03 DIAGNOSIS — G35 Multiple sclerosis: Secondary | ICD-10-CM | POA: Diagnosis not present

## 2018-06-03 DIAGNOSIS — Z87440 Personal history of urinary (tract) infections: Secondary | ICD-10-CM | POA: Diagnosis not present

## 2018-06-03 DIAGNOSIS — N401 Enlarged prostate with lower urinary tract symptoms: Secondary | ICD-10-CM | POA: Diagnosis not present

## 2018-06-03 DIAGNOSIS — N3941 Urge incontinence: Secondary | ICD-10-CM | POA: Diagnosis not present

## 2018-06-09 DIAGNOSIS — Z87440 Personal history of urinary (tract) infections: Secondary | ICD-10-CM | POA: Diagnosis not present

## 2018-06-09 DIAGNOSIS — G35 Multiple sclerosis: Secondary | ICD-10-CM | POA: Diagnosis not present

## 2018-06-09 DIAGNOSIS — N3941 Urge incontinence: Secondary | ICD-10-CM | POA: Diagnosis not present

## 2018-06-09 DIAGNOSIS — K219 Gastro-esophageal reflux disease without esophagitis: Secondary | ICD-10-CM | POA: Diagnosis not present

## 2018-06-09 DIAGNOSIS — Z466 Encounter for fitting and adjustment of urinary device: Secondary | ICD-10-CM | POA: Diagnosis not present

## 2018-06-09 DIAGNOSIS — N401 Enlarged prostate with lower urinary tract symptoms: Secondary | ICD-10-CM | POA: Diagnosis not present

## 2018-06-11 DIAGNOSIS — Z466 Encounter for fitting and adjustment of urinary device: Secondary | ICD-10-CM | POA: Diagnosis not present

## 2018-06-11 DIAGNOSIS — Z87440 Personal history of urinary (tract) infections: Secondary | ICD-10-CM | POA: Diagnosis not present

## 2018-06-11 DIAGNOSIS — G35 Multiple sclerosis: Secondary | ICD-10-CM | POA: Diagnosis not present

## 2018-06-11 DIAGNOSIS — K219 Gastro-esophageal reflux disease without esophagitis: Secondary | ICD-10-CM | POA: Diagnosis not present

## 2018-06-11 DIAGNOSIS — N401 Enlarged prostate with lower urinary tract symptoms: Secondary | ICD-10-CM | POA: Diagnosis not present

## 2018-06-11 DIAGNOSIS — N3941 Urge incontinence: Secondary | ICD-10-CM | POA: Diagnosis not present

## 2018-06-16 DIAGNOSIS — N401 Enlarged prostate with lower urinary tract symptoms: Secondary | ICD-10-CM | POA: Diagnosis not present

## 2018-06-16 DIAGNOSIS — N3941 Urge incontinence: Secondary | ICD-10-CM | POA: Diagnosis not present

## 2018-06-16 DIAGNOSIS — G35 Multiple sclerosis: Secondary | ICD-10-CM | POA: Diagnosis not present

## 2018-06-16 DIAGNOSIS — Z87440 Personal history of urinary (tract) infections: Secondary | ICD-10-CM | POA: Diagnosis not present

## 2018-06-16 DIAGNOSIS — K219 Gastro-esophageal reflux disease without esophagitis: Secondary | ICD-10-CM | POA: Diagnosis not present

## 2018-06-16 DIAGNOSIS — Z466 Encounter for fitting and adjustment of urinary device: Secondary | ICD-10-CM | POA: Diagnosis not present

## 2018-06-17 ENCOUNTER — Other Ambulatory Visit (HOSPITAL_COMMUNITY)
Admission: RE | Admit: 2018-06-17 | Discharge: 2018-06-17 | Disposition: A | Discharge status: Home / Self Care | Payer: Medicare Other | Source: Ambulatory Visit | Attending: Radiology | Admitting: Radiology

## 2018-06-17 ENCOUNTER — Ambulatory Visit
Admission: RE | Admit: 2018-06-17 | Discharge: 2018-06-17 | Disposition: A | Discharge status: Home / Self Care | Payer: Medicare Other | Source: Ambulatory Visit | Attending: Internal Medicine | Admitting: Internal Medicine

## 2018-06-17 DIAGNOSIS — E041 Nontoxic single thyroid nodule: Secondary | ICD-10-CM | POA: Diagnosis not present

## 2018-06-18 DIAGNOSIS — Z466 Encounter for fitting and adjustment of urinary device: Secondary | ICD-10-CM | POA: Diagnosis not present

## 2018-06-18 DIAGNOSIS — K219 Gastro-esophageal reflux disease without esophagitis: Secondary | ICD-10-CM | POA: Diagnosis not present

## 2018-06-18 DIAGNOSIS — N3941 Urge incontinence: Secondary | ICD-10-CM | POA: Diagnosis not present

## 2018-06-18 DIAGNOSIS — Z87440 Personal history of urinary (tract) infections: Secondary | ICD-10-CM | POA: Diagnosis not present

## 2018-06-18 DIAGNOSIS — G35 Multiple sclerosis: Secondary | ICD-10-CM | POA: Diagnosis not present

## 2018-06-18 DIAGNOSIS — N401 Enlarged prostate with lower urinary tract symptoms: Secondary | ICD-10-CM | POA: Diagnosis not present

## 2018-06-22 ENCOUNTER — Telehealth: Payer: Self-pay | Admitting: *Deleted

## 2018-06-22 NOTE — Telephone Encounter (Signed)
Received Cytopathology Report results from [facility]; forwarded to provider/SLS 07/01

## 2018-06-23 ENCOUNTER — Telehealth: Payer: Self-pay | Admitting: *Deleted

## 2018-06-23 DIAGNOSIS — Z87440 Personal history of urinary (tract) infections: Secondary | ICD-10-CM | POA: Diagnosis not present

## 2018-06-23 DIAGNOSIS — N401 Enlarged prostate with lower urinary tract symptoms: Secondary | ICD-10-CM | POA: Diagnosis not present

## 2018-06-23 DIAGNOSIS — K219 Gastro-esophageal reflux disease without esophagitis: Secondary | ICD-10-CM | POA: Diagnosis not present

## 2018-06-23 DIAGNOSIS — G35 Multiple sclerosis: Secondary | ICD-10-CM | POA: Diagnosis not present

## 2018-06-23 DIAGNOSIS — N3941 Urge incontinence: Secondary | ICD-10-CM | POA: Diagnosis not present

## 2018-06-23 DIAGNOSIS — Z466 Encounter for fitting and adjustment of urinary device: Secondary | ICD-10-CM | POA: Diagnosis not present

## 2018-06-23 NOTE — Telephone Encounter (Signed)
Received Home Health Certification and Plan of Care; forwarded to provider/SLS 07/02

## 2018-06-26 NOTE — Telephone Encounter (Signed)
Plan of care signed and faxed to [facility]- [phone]. Form sent for scanning.

## 2018-06-28 IMAGING — US US FNA BIOPSY THYROID 1ST LESION
1 series · 13 of 18 positions shown · non-contrast
Comparison: Thyroid ultrasound dated 05/26/2018

MEDICATIONS:
None

COMPLICATIONS:
None immediate.

INDICATION: Patient with history of thyroid goiter and thyroid ultrasound on
05/26/2018 which revealed a 4.9 cm right mid nodule which meets
criteria for biopsy. He presents today for the procedure.

EXAM:
ULTRASOUND GUIDED FINE NEEDLE ASPIRATION BIOPSY OF RIGHT MID THYROID
NODULE
TECHNIQUE: Informed written consent was obtained from the patient after a
discussion of the risks, benefits and alternatives to treatment.
Questions regarding the procedure were encouraged and answered. A
timeout was performed prior to the initiation of the procedure.

[Series 1: us fna biopsy thyroid 1st lesion · 0.07mm/px · 18 acquisitions, 13 frames shown]
[im 1/18]
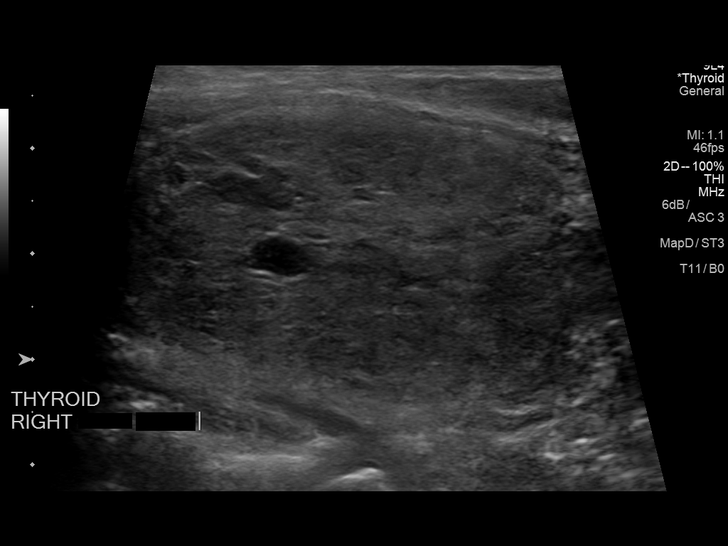
[im 3/18]
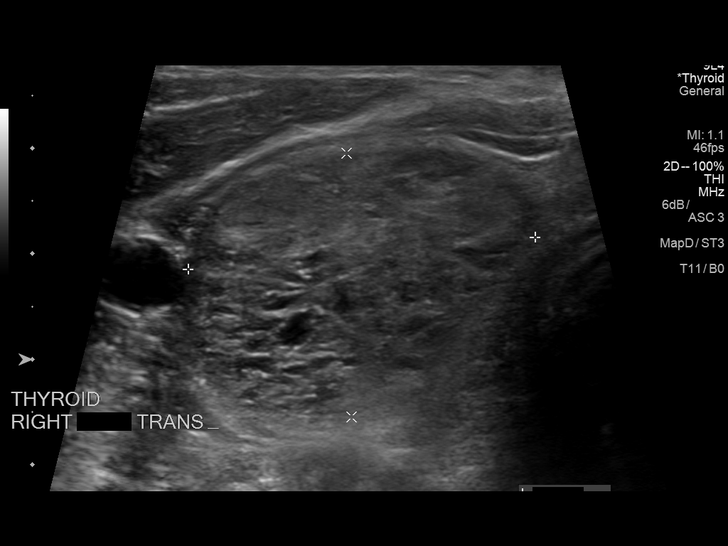
[im 4/18]
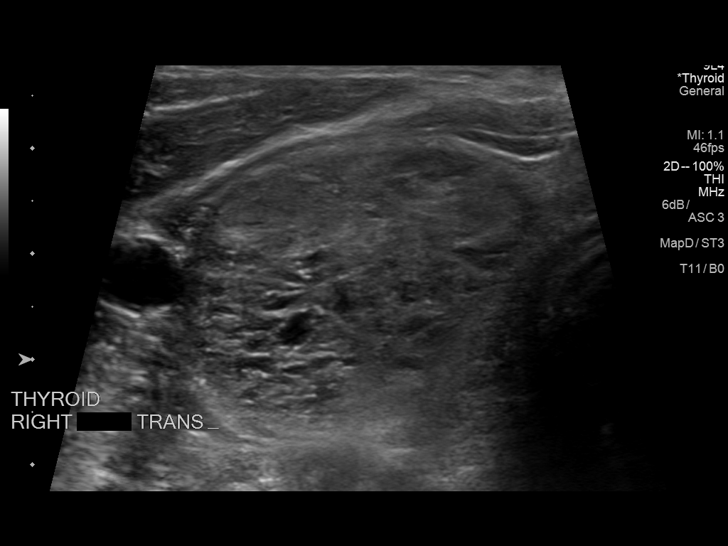
[im 5/18]
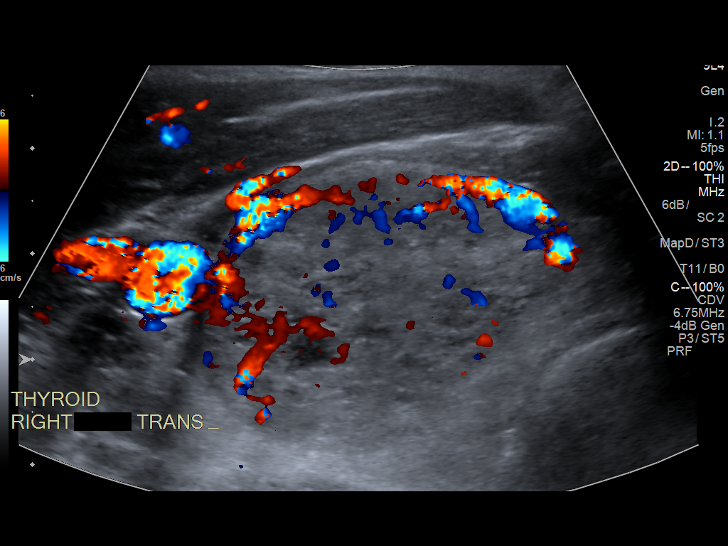
[im 7/18]
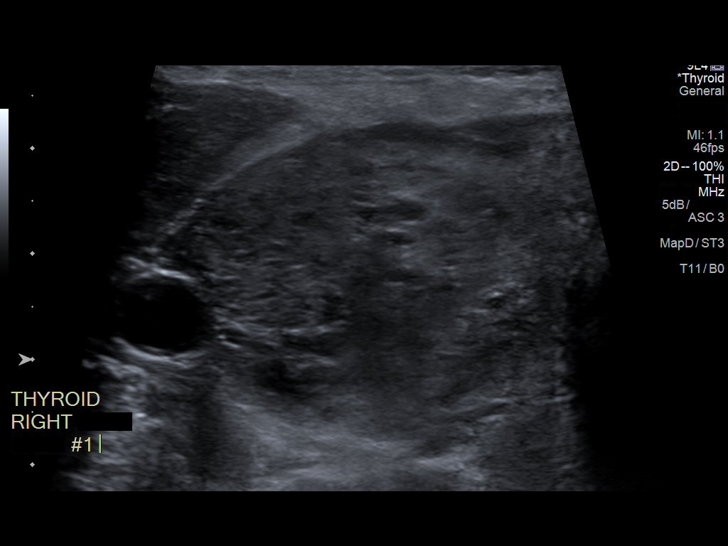
[im 8/18]
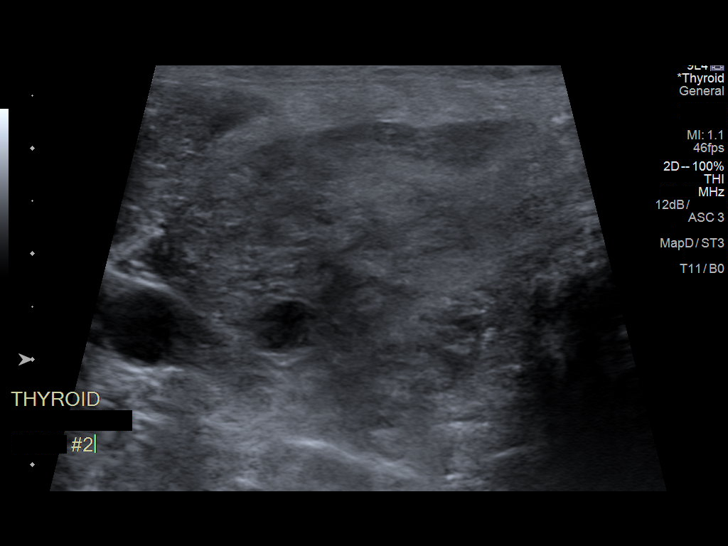
[im 10/18]
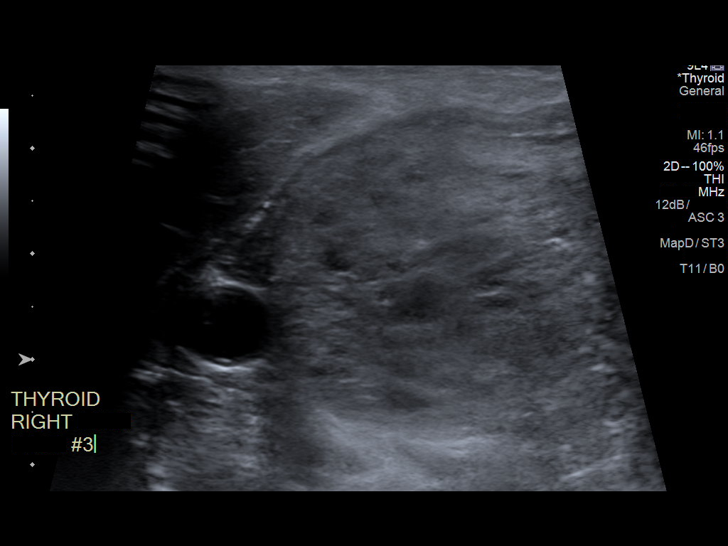
[im 11/18]
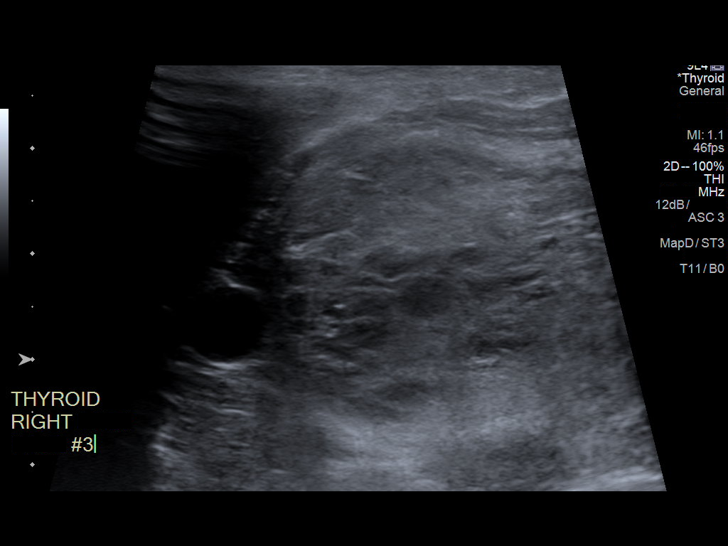
[im 12/18]
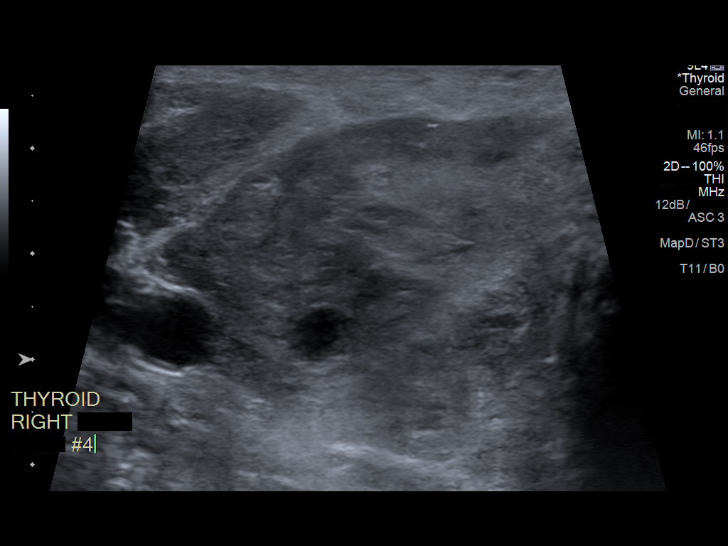
[im 14/18]
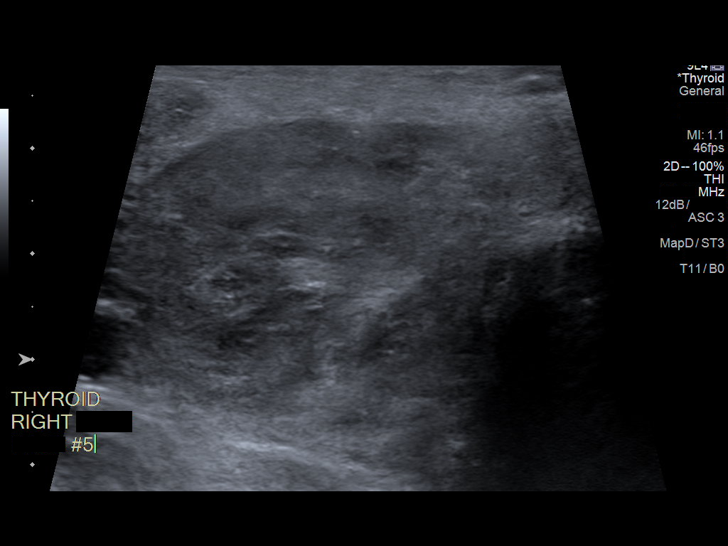
[im 15/18]
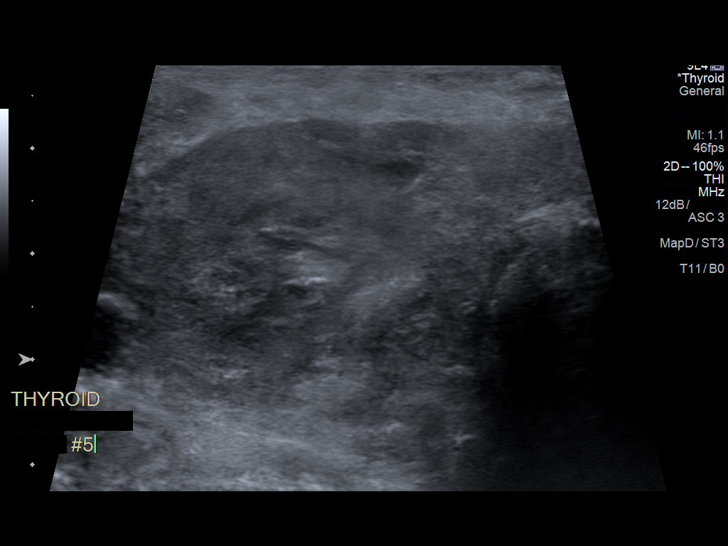
[im 16/18]
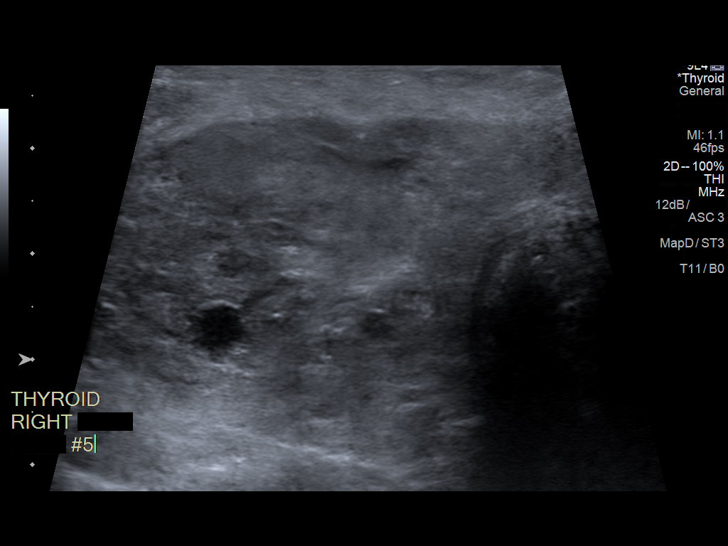
[im 18/18]
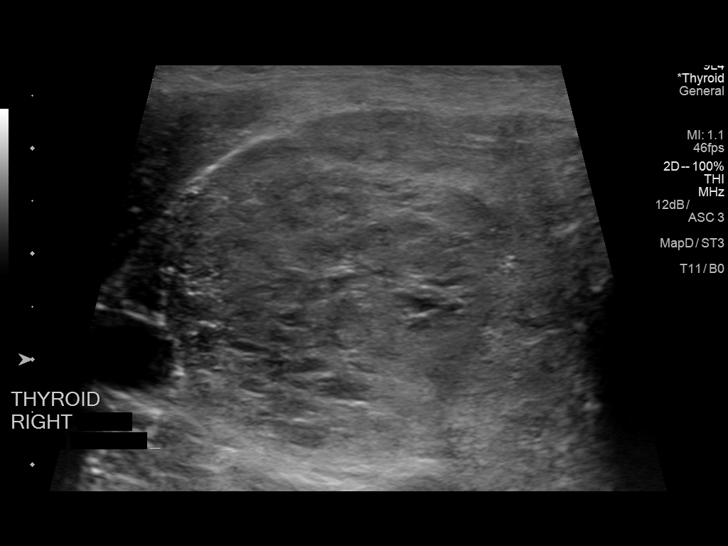

[13 of 18 positions shown; findings below may reference images not displayed]

Pre-procedural ultrasound scanning demonstrated unchanged size and
appearance of the indeterminate nodule within the right mid thyroid
lobe

The procedure was planned. The neck was prepped in the usual sterile
fashion, and a sterile drape was applied covering the operative
field. A timeout was performed prior to the initiation of the
procedure. Local anesthesia was provided with 1% lidocaine.

Under direct ultrasound guidance, 5 FNA biopsies were performed of
the right mid thyroid nodule with 25 gauge needles. Multiple
ultrasound images were saved for procedural documentation purposes.
The samples were prepared and submitted to pathology as well as for
Afirma testing.

Limited post procedural scanning was negative for hematoma or
additional complication. Dressings were placed. The patient
tolerated the above procedures procedure well without immediate
postprocedural complication.
FINDINGS: FINDINGS
Nodule reference number based on prior diagnostic ultrasound: 1

Maximum size: 4.9 cm

Location: Right  ;  Mid

ACR TI-RADS total points: 3

ACR TI-RADS risk category:  TR3

Prior biopsy:  No

Reason for biopsy: meets ACR TI-RADS criteria

Ultrasound imaging confirms appropriate placement of the needles
within the thyroid nodule.
IMPRESSION: Technically successful ultrasound guided fine needle aspiration
biopsy of right mid thyroid nodule. Final pathology pending.

## 2018-07-02 ENCOUNTER — Telehealth: Payer: Self-pay | Admitting: *Deleted

## 2018-07-02 NOTE — Telephone Encounter (Signed)
Received Episode Summary Report for review; forwarded to provider/SLS 07/11

## 2018-07-07 ENCOUNTER — Telehealth: Payer: Self-pay | Admitting: *Deleted

## 2018-07-07 NOTE — Telephone Encounter (Signed)
Received Physician Orders from [facility]; forwarded to provider/SLS 07/16

## 2018-07-07 NOTE — Telephone Encounter (Signed)
Orders signed and faxed to [facility] at [phone]. Forms sent for scanning.  

## 2018-07-28 DIAGNOSIS — G35 Multiple sclerosis: Secondary | ICD-10-CM | POA: Diagnosis not present

## 2018-08-21 ENCOUNTER — Ambulatory Visit: Payer: Medicare Other | Admitting: Internal Medicine

## 2018-08-26 ENCOUNTER — Encounter: Payer: Self-pay | Admitting: Internal Medicine

## 2018-08-26 ENCOUNTER — Ambulatory Visit (INDEPENDENT_AMBULATORY_CARE_PROVIDER_SITE_OTHER): Payer: Medicare Other | Admitting: Internal Medicine

## 2018-08-26 VITALS — BP 143/68 | HR 86 | Temp 98.2°F | Resp 16 | Ht 72.0 in

## 2018-08-26 DIAGNOSIS — E041 Nontoxic single thyroid nodule: Secondary | ICD-10-CM | POA: Diagnosis not present

## 2018-08-26 DIAGNOSIS — R319 Hematuria, unspecified: Secondary | ICD-10-CM

## 2018-08-26 DIAGNOSIS — L98429 Non-pressure chronic ulcer of back with unspecified severity: Secondary | ICD-10-CM | POA: Diagnosis not present

## 2018-08-26 NOTE — Patient Instructions (Signed)
Please bring the urine sample at your earliest convenience  Next visit in 4 to 6 months

## 2018-08-26 NOTE — Progress Notes (Signed)
Subjective:    Patient ID: Stephen Wade, male    DOB: 03-Aug-1951, 67 y.o.   MRN: [id number]  DOS:  08/26/2018 Type of visit - description : f/u, here w/  His wife Interval history: Since the last office visit, a thyroid biopsy was negative. Depression: Somewhat improved according to the patient, denies current suicidal ideas 2 days ago saw some blood in the urine ; he self catheterize; problem lasted < 48 hours, now urine is clear.   Review of Systems Denies fever chills No nausea or vomiting Not taking Flomax, plans to discuss with urology.   Past Medical History:  Diagnosis Date  . GERD (gastroesophageal reflux disease)    had on- off chest pain, symptoms resolved with Prilosec 11/2010  . Lower urinary tract symptoms (LUTS)    Dr Patsi Sears  . MS (multiple sclerosis) (HCC) 2005  . Testicular hypogonadism   . Urge incontinence of urine   . Urolithiasis 2011    Past Surgical History:  Procedure Laterality Date  . APPENDECTOMY      ruptured 03-2010  . VASECTOMY      Social History   Socioeconomic History  . Marital status: Married    Spouse name: Not on file  . Number of children: 3  . Years of education: Not on file  . Highest education level: Not on file  Occupational History  . Occupation: still works some from home    Employer: [employer]  Social Needs  . Financial resource strain: Not on file  . Food insecurity:    Worry: Not on file    Inability: Not on file  . Transportation needs:    Medical: Not on file    Non-medical: Not on file  Tobacco Use  . Smoking status: Former Games developer  . Smokeless tobacco: Never Used  . Tobacco comment: 3 ppd, quit 1999  Substance and Sexual Activity  . Alcohol use: No    Alcohol/week: 0.0 standard drinks    Comment: rare  . Drug use: No  . Sexual activity: Not on file  Lifestyle  . Physical activity:    Days per week: Not on file    Minutes per session: Not on file  . Stress: Not on file  Relationships  .  Social connections:    Talks on phone: Not on file    Gets together: Not on file    Attends religious service: Not on file    Active member of club or organization: Not on file    Attends meetings of clubs or organizations: Not on file    Relationship status: Not on file  . Intimate partner violence:    Fear of current or ex partner: Not on file    Emotionally abused: Not on file    Physically abused: Not on file    Forced sexual activity: Not on file  Other Topics Concern  . Not on file  Social History Narrative   Lives w/ wife       Allergies as of 08/26/2018   No Known Allergies     Medication List        Accurate as of 08/26/18  4:35 PM. Always use your most recent med list.          baclofen 10 MG tablet Commonly known as:  LIORESAL Take 1 tablet (10 mg total) by mouth 3 (three) times daily.   ENSURE Take 237 mLs daily by mouth.   OCREVUS 300 MG/10ML injection Generic drug:  ocrelizumab  Inject 300 mg every 6 (six) months into the vein.   omeprazole 20 MG capsule Commonly known as:  PRILOSEC Take 20 mg daily as needed by mouth (for acid reflex).          Objective:   Physical Exam BP (!) 143/68 (BP Location: Right Arm, Patient Position: Sitting, Cuff Size: Small)   Pulse 86   Temp 98.2 F (36.8 C) (Oral)   Resp 16   Ht 6' (1.829 m)   SpO2 99%   BMI 26.19 kg/m   General:   Well developed, NAD, see BMI.  HEENT:  Normocephalic . Face symmetric, atraumatic Lungs:  CTA B Normal respiratory effort, no intercostal retractions, no accessory muscle use. Heart: RRR,  no murmur.  No pretibial edema bilaterally  Abdominal exam: Patient sitting in a wheelchair exam is limited but abdomen is soft, nontender. Skin: Not pale. Not jaundice Neurologic:  alert & oriented X3.  Speech somewhat limited/slow, gait not tested, patient sitting in a wheelchair.    Psych--  Cognition and judgment appear intact.  Cooperative with normal attention span and  concentration.  Behavior appropriate. No anxious or depressed appearing.      Assessment & Plan:    Assessment Multiple sclerosis, Dr Epimenio Foot  Decubitus ulcer on and off, buttock  Depression Recurrent UTIs, history of urolithiasis, BPH: Self cath Vitamin D deficiency Thyroid nodules per ultrasound, benign biopsy 05/2018,  Plan: MS: Per neurology Decubitus ulcer: Patient reports the skin is completely clear Depression: Since the last office visit, overall he feels better, denies recent or recurrent suicidal ideas Thyroid nodule, had a biopsy, negative.  Reassess from time to time. UTI?  Had hematuria, self-resolved, no fever chills. Pt will bring a  catheterized sample for a UA urine culture. Preventive care: strongly declined prevnar , benefits discussed RT C 4-6 months

## 2018-08-26 NOTE — Progress Notes (Signed)
Pre visit review using our clinic review tool, if applicable. No additional management support is needed unless otherwise documented below in the visit note. 

## 2018-08-27 NOTE — Assessment & Plan Note (Signed)
MS: Per neurology Decubitus ulcer: Patient reports the skin is completely clear Depression: Since the last office visit, overall he feels better, denies recent or recurrent suicidal ideas Thyroid nodule, had a biopsy, negative.  Reassess from time to time. UTI?  Had hematuria, self-resolved, no fever chills. Pt will bring a  catheterized sample for a UA urine culture. Preventive care: strongly declined prevnar , benefits discussed RT C 4-6 months

## 2018-09-03 DIAGNOSIS — R338 Other retention of urine: Secondary | ICD-10-CM | POA: Diagnosis not present

## 2018-09-03 DIAGNOSIS — N401 Enlarged prostate with lower urinary tract symptoms: Secondary | ICD-10-CM | POA: Diagnosis not present

## 2018-09-03 DIAGNOSIS — R351 Nocturia: Secondary | ICD-10-CM | POA: Diagnosis not present

## 2018-09-03 DIAGNOSIS — R8271 Bacteriuria: Secondary | ICD-10-CM | POA: Diagnosis not present

## 2018-10-14 DIAGNOSIS — Z23 Encounter for immunization: Secondary | ICD-10-CM | POA: Diagnosis not present

## 2019-01-25 ENCOUNTER — Telehealth: Payer: Self-pay | Admitting: *Deleted

## 2019-01-25 NOTE — Telephone Encounter (Signed)
I called pt. We received message from [facility] that pt told them he does not want Ocrevus anymore d/t issues with last infusion. He has appt with Dr. Karna Christmas 03/24/19 but Dr. Epimenio Foot wants him to be seen sooner to discuss. I r/s for earlier f/u on 02/09/19 at 930am, check in 9:00am. I cx appt in April. Pt verbalized understanding and appreciation.

## 2019-02-09 ENCOUNTER — Ambulatory Visit: Payer: Self-pay | Admitting: Neurology

## 2019-02-10 ENCOUNTER — Encounter: Payer: Self-pay | Admitting: Neurology

## 2019-02-24 ENCOUNTER — Ambulatory Visit: Payer: Medicare Other | Admitting: Internal Medicine

## 2019-03-04 ENCOUNTER — Telehealth: Payer: Self-pay | Admitting: *Deleted

## 2019-03-04 MED ORDER — BACLOFEN 10 MG PO TABS: 10.0000 mg | ORAL_TABLET | Freq: Three times a day (TID) | ORAL | 0 refills | Status: DC

## 2019-03-04 NOTE — Telephone Encounter (Signed)
Baclofen escribed to [facility] in response to faxed request from them/fim

## 2019-03-15 ENCOUNTER — Telehealth: Payer: Self-pay | Admitting: *Deleted

## 2019-03-15 ENCOUNTER — Ambulatory Visit: Payer: Medicare Other | Admitting: Neurology

## 2019-03-15 NOTE — Telephone Encounter (Signed)
We received a fax that his Ocrevus paperwork is going to expire on 03/27/2019.  I called [facility] [phone]) and spoke to [place].  He is aware our office is not seeing non-urgent patients due to Covid-19 precautions.  He has made a note in this patient's account and they will reach out to him for an electronic signature.  No other paperwork is need from our office at this time.

## 2019-03-24 ENCOUNTER — Ambulatory Visit: Payer: Medicare Other | Admitting: Neurology

## 2019-03-24 ENCOUNTER — Encounter

## 2019-04-09 ENCOUNTER — Telehealth: Payer: Self-pay

## 2019-04-09 NOTE — Telephone Encounter (Signed)
Spoke w/ Pt- offered virtual visit- declined at this time.

## 2019-05-29 ENCOUNTER — Encounter (HOSPITAL_BASED_OUTPATIENT_CLINIC_OR_DEPARTMENT_OTHER): Payer: Self-pay | Admitting: *Deleted

## 2019-05-29 ENCOUNTER — Other Ambulatory Visit: Payer: Self-pay

## 2019-05-29 ENCOUNTER — Emergency Department (HOSPITAL_BASED_OUTPATIENT_CLINIC_OR_DEPARTMENT_OTHER)
Admission: EM | Admit: 2019-05-29 | Discharge: 2019-05-29 | Disposition: A | Discharge status: Home / Self Care | Payer: Medicare Other | Attending: Emergency Medicine | Admitting: Emergency Medicine

## 2019-05-29 DIAGNOSIS — M7031 Other bursitis of elbow, right elbow: Secondary | ICD-10-CM | POA: Insufficient documentation

## 2019-05-29 DIAGNOSIS — Z87891 Personal history of nicotine dependence: Secondary | ICD-10-CM | POA: Insufficient documentation

## 2019-05-29 DIAGNOSIS — M25421 Effusion, right elbow: Secondary | ICD-10-CM | POA: Diagnosis not present

## 2019-05-29 DIAGNOSIS — R2231 Localized swelling, mass and lump, right upper limb: Secondary | ICD-10-CM | POA: Diagnosis present

## 2019-05-29 DIAGNOSIS — L539 Erythematous condition, unspecified: Secondary | ICD-10-CM | POA: Insufficient documentation

## 2019-05-29 DIAGNOSIS — Y9389 Activity, other specified: Secondary | ICD-10-CM | POA: Insufficient documentation

## 2019-05-29 MED ORDER — CEPHALEXIN 500 MG PO CAPS: 500.0000 mg | ORAL_CAPSULE | Freq: Three times a day (TID) | ORAL | 0 refills | Status: DC

## 2019-05-29 NOTE — ED Triage Notes (Signed)
Pt has hx of MS and uses a scooter. He has problems hitting his elbow on things and is wear an elbow sleeve. Reports redness and swelling to right elbow x 3 days. He was sent from Urgent Care for further evaluation. His wife is at bedside

## 2019-05-29 NOTE — Discharge Instructions (Addendum)
Follow-up closely with your primary physician.  Return immediately for worsening redness, pain, fever, chills or for any concerns.

## 2019-05-29 NOTE — ED Provider Notes (Signed)
[place] EMERGENCY DEPARTMENT Provider Note   CSN: [id number] Arrival date & time: 05/29/19  1557    History   Chief Complaint Chief Complaint  Patient presents with  . Arm Swelling    HPI Stephen Wade is a 68 y.o. male.     HPI Patient presents with swelling and redness to his right elbow.  Has a history of MS and rides on a mobility scooter.  Frequently bumps his elbow on things.  No known recent trauma.  No fever or chills.  No pain with range of motion.  Otherwise patient states he is in his normal state of health. Past Medical History:  Diagnosis Date  . GERD (gastroesophageal reflux disease)    had on- off chest pain, symptoms resolved with Prilosec 11/2010  . Lower urinary tract symptoms (LUTS)    Dr Gaynelle Arabian  . MS (multiple sclerosis) ([place]) 2005  . Testicular hypogonadism   . Urge incontinence of urine   . Urolithiasis 2011    Patient Active Problem List   Diagnosis Date Noted  . PCP NOTES >>>>>>> 05/24/2018  . Depression, recurrent ([place]) 05/24/2018  . History of recurrent UTIs 05/22/2018  . Pressure injury of skin 11/06/2017  . Multiple sclerosis exacerbation ([place]) 11/04/2017  . Skin ulcer of sacral region ([place]) 05/26/2017  . Vitamin D deficiency 01/16/2016  . Spastic gait 02/10/2015  . Spastic diplegia ([place]) 02/10/2015  . Weakness 02/10/2015  . Disturbed cognition 02/10/2015  . Edema 05/12/2014  . Hypogonadism male 03/11/2013  . BPH - self caths 12/26/2011  . GERD 11/30/2010  . URINARY CALCULUS 10/05/2010    Past Surgical History:  Procedure Laterality Date  . APPENDECTOMY      ruptured 03-2010  . VASECTOMY          Home Medications    Prior to Admission medications   Medication Sig Start Date End Date Taking? Authorizing Provider  ENSURE (ENSURE) Take 237 mLs daily by mouth.   Yes [provider]  baclofen (LIORESAL) 10 MG tablet Take 1 tablet (10 mg total) by mouth 3 (three) times daily. 03/04/19   Sater, Nanine Means, MD  cephALEXin (KEFLEX) 500 MG capsule Take 1 capsule (500 mg total) by mouth 3 (three) times daily. 05/29/19   Julianne Rice, MD  ocrelizumab (OCREVUS) 300 MG/10ML injection Inject 300 mg every 6 (six) months into the vein.    [provider]  omeprazole (PRILOSEC) 20 MG capsule Take 20 mg daily as needed by mouth (for acid reflex).     [provider]    Family History Family History  Problem Relation Age of Onset  . Heart attack Mother   . Brain cancer Father   . Colon cancer Neg Hx   . Prostate cancer Neg Hx     Social History Social History   Tobacco Use  . Smoking status: Former Research scientist (life sciences)  . Smokeless tobacco: Never Used  . Tobacco comment: 3 ppd, quit 1999  Substance Use Topics  . Alcohol use: No    Alcohol/week: 0.0 standard drinks    Comment: rare  . Drug use: No     Allergies   Patient has no known allergies.   Review of Systems Review of Systems  Constitutional: Negative for chills and fever.  Musculoskeletal: Positive for joint swelling. Negative for arthralgias and myalgias.  Skin: Positive for color change. Negative for wound.  Neurological: Negative for dizziness, weakness, light-headedness, numbness and headaches.  All other systems reviewed and are negative.  Physical Exam Updated Vital Signs BP 130/87 (BP Location: Left Arm)   Pulse 89   Temp 98.2 F (36.8 C) (Oral)   Resp 18   Ht 6' (1.829 m)   Wt 95.3 kg   SpO2 98%   BMI 28.48 kg/m   Physical Exam Vitals signs and nursing note reviewed.  Constitutional:      Appearance: Normal appearance. He is well-developed.  HENT:     Head: Normocephalic and atraumatic.  Neck:     Musculoskeletal: Normal range of motion and neck supple.  Cardiovascular:     Rate and Rhythm: Normal rate.  Pulmonary:     Effort: Pulmonary effort is normal.  Abdominal:     Palpations: Abdomen is soft.  Musculoskeletal: Normal range of motion.        General: Swelling present. No  tenderness.     Comments: Patient has erythema over the olecranon process of the left elbow with mild swelling.  He has no tenderness to palpation.  No pain with range of motion of the right elbow.  Distal pulses are 2+.  Skin:    General: Skin is warm and dry.     Capillary Refill: Capillary refill takes less than 2 seconds.     Findings: No erythema or rash.  Neurological:     General: No focal deficit present.     Mental Status: He is alert and oriented to person, place, and time.     Comments: 5/5 grip strength in the right arm.  Sensation intact.  Psychiatric:        Behavior: Behavior normal.      ED Treatments / Results  Labs (all labs ordered are listed, but only abnormal results are displayed) Labs Reviewed - No data to display  EKG None  Radiology No results found.  Procedures Procedures (including critical care time)  Medications Ordered in ED Medications - No data to display   Initial Impression / Assessment and Plan / ED Course  I have reviewed the triage vital signs and the nursing notes.  Pertinent labs & imaging results that were available during my care of the patient were reviewed by me and considered in my medical decision making (see chart for details).        Low suspicion for septic joint.  Likely has inflammatory bursitis.  Given other comorbid conditions will cover with antibiotics.  Patient is informed to follow-up closely with his primary physician and strict return precautions have been given.  Final Clinical Impressions(s) / ED Diagnoses   Final diagnoses:  Bursitis of right elbow, unspecified bursa    ED Discharge Orders         Ordered    cephALEXin (KEFLEX) 500 MG capsule  3 times daily     05/29/19 1628           Loren Racer, MD 05/29/19 [phone]

## 2019-05-29 NOTE — ED Notes (Signed)
ED Provider at bedside. 

## 2019-05-31 ENCOUNTER — Telehealth: Payer: Self-pay | Admitting: Internal Medicine

## 2019-05-31 NOTE — Telephone Encounter (Signed)
Called pt for Hospital follow up... He said he was taking his meds and would call back to make an appt when he feels ready

## 2019-05-31 NOTE — Telephone Encounter (Signed)
thx

## 2019-06-21 ENCOUNTER — Telehealth: Payer: Self-pay | Admitting: *Deleted

## 2019-06-21 NOTE — Telephone Encounter (Signed)
Called pt. Updated med list, pharmacy, allergies for mychart video visit tomorrow.

## 2019-06-22 ENCOUNTER — Encounter: Payer: Self-pay | Admitting: Neurology

## 2019-06-22 ENCOUNTER — Telehealth (INDEPENDENT_AMBULATORY_CARE_PROVIDER_SITE_OTHER): Payer: Medicare Other | Admitting: Neurology

## 2019-06-22 DIAGNOSIS — G35 Multiple sclerosis: Secondary | ICD-10-CM

## 2019-06-22 DIAGNOSIS — R261 Paralytic gait: Secondary | ICD-10-CM | POA: Diagnosis not present

## 2019-06-22 DIAGNOSIS — G801 Spastic diplegic cerebral palsy: Secondary | ICD-10-CM

## 2019-06-22 DIAGNOSIS — R339 Retention of urine, unspecified: Secondary | ICD-10-CM | POA: Diagnosis not present

## 2019-06-22 NOTE — Progress Notes (Signed)
[facility]  PATIENT: Stephen Wade DOB: 09/06/1951  REFERRING DOCTOR OR PCP:  Kathlene November SOURCE: Patient and wife  _________________________________   HISTORICAL  CHIEF COMPLAINT:  Chief Complaint  Patient presents with  . Multiple Sclerosis    Last Ocrevus August 2019  . Gait Problem    HISTORY OF PRESENT ILLNESS:  Stephen Wade is a 68 y.o. man who was diagnosed with MS in 2005.    Update 06/22/2019: Virtual Visit via Video Note I connected with Stephen Wade on 06/22/19 at  8:30 AM EDT by a video enabled telemedicine application and verified that I am speaking with the correct person.  I discussed the limitations of evaluation and management by telemedicine and the availability of in person appointments. The patient expressed understanding and agreed to proceed.  History of Present Illness: He had his first split dose of Ocrevus July/August 2019.  He tolerated the first infusion well but did have mild reactions to the second infusions.  He was scheduled for Ocrevus in late January but opted not to be infused as he had a reaction to the medication.   We discussed treatment options.   He was diagnosed with MS in 2005.  His last exacerbation was at least 10 years ago.   I personally reviewed MRI from 2007, 2010 and 2018.  He did have some new lesions between 2007 and 2010 but there were no definite new lesions in the brain or cervical spine between 2010 and 2018.  He needed a cane after his first exacerbation in 2005 and has progressively worsened, to standard walker, rolling walker and now scooter.Marland Kitchen  He now spends most of the time in the scooter and can transfer independently unless more tired.     He has had multiple urinary tract infections.  He needs to self-cath 6 times a day due to urinary retention.   He also has constipation and takes Metamucil without enough benefit.  He reports fatigue especially when hot or later in the day.  He sleeps well most nights.  He  denies too much problems with cognition.  His voice is slurred.  Mood is currently doing well.   Observations/Objective: He is a well-developed well-nourished man in no acute distress.  The head is normocephalic and atraumatic.  Sclera are anicteric.  Visible skin appears normal.  The neck has a good range of motion.  Pharynx and tongue have normal appearance.  He is alert and fully oriented with fluent speech and good attention, knowledge and memory.  Extraocular muscles are intact.  Facial strength is normal.  Palatal elevation and tongue protrusion are midline.  He appears to have normal strength in the arms.  Rapid alternating movements and finger-nose-finger are worse on the right than the left.  Assessment and Plan: 1. Multiple sclerosis (HCC)   2. Spastic diplegia (HCC)   3. Spastic gait   4. Urinary retention     1.   For now he can hold off on the Ocrevus.  We will rediscuss treatment options at his next visit.  He has active secondary progressive MS. 2.   Advised to try stool softeners for the constipation.  If not effective then move up to MiraLAX.  If still not effective, we can refer to GI for additional options. 3.   Try to stay active and exercise as tolerated. 4.    He will return to see me in 4-5 months or sooner if there are new or worsening neurologic symptoms.  Follow Up Instructions: I discussed the assessment and treatment plan with the patient. The patient was provided an opportunity to ask questions and all were answered. The patient agreed with the plan and demonstrated an understanding of the instructions.    The patient was advised to call back or seek an in-person evaluation if the symptoms worsen or if the condition fails to improve as anticipated.  I provided 25 minutes of non-face-to-face time during this encounter.  _________________________________________________ From previous visits: Update 01/20/2018: He was hospitalized for a week in November after a UTI  due to urinary retention (he now cath's).   He had weakness and cognitive dysfunction.I personally reviewed the MRI's and he does not appear to have any new lesions with resolution of the he slowly returned back to his baseline. Currently, he is cathing himself 4 or 5 times a day and wears a condom cath at night in case there is any overflow.  In July, he switched to ocrelizumab from Tecfidera. He tolerated the first infusion very well.  He uses a scooter to get around that time. He has bilateral leg weakness, worse on the right.There is also mild numbness on the right. Muscle tone is increased, right greater than left. He denies any significant problems with his vision.  He has had some dysphagia and sometimes coughs up food.    He is often tired during the day.  Sleep is poor and he wakes up 3-4 times nightly.    He wakes up sometimes due to spasticity and leg pain.    Mood is about the same.    He is occasionlly irritable.   He feels better if he exercises some.    From 05/26/2017:                                                                                                                                                    MS:   He is currently on Tecfidera. He tolerates it well. We have previously discussed ocrelizumab as he had some interest in switching therapies.    We discussed the pros and cons of staying on Tecfidera versus switching to ocrelizumab and also reviewed with the clinical study data for ocrelizumab.  Gait, strength, sensation are about the same.   He is in a scooter much of the day.   He notes more more leg weakness the past 2 weeks.    The right leg is always worse.   He continues to exercise daily with a reclining elliptical.  Bladder/bowel:   He is now having much more urinary frequency and urgency.    He has more incontinence.      He sees Dr. Patsi Searsannenbaum and he has recommended urodynamics and maybe a transurethral prostate procedure.      Fatigue/sleep:   Fatigue is  tolerable most days.  Worse in heat.  He sleeps worse due to his bladder.     Mood and cognition have been stable.  He has mild skin breakdown in the sacral area.  His wife applies silver pads.   He has seen wound care in the past and we discussed him getting back in.   Patrcia Dolly([place]).      MS History:  In 2005, he woke up with right leg weakness and clumsiness.  He had MRI's of the brain and spine consistent with MS.   He was originally seeing [facility]   A repeat MRI showed progression allowing the diagnosis of MS to be made.    He started on Betaseron.  Since his first exacerbation, he has had progressive gait and cognitive dysfunction but these changes have mostly been gradual.     Due to worsening, he was placed on Tysabri but felt much worse while on it.   He was also JCV Ab positive so stopped after 4-6 do7ses.   He went back to Betaseron and a couple years later transferred care to me.   We changed to Tecfidera.   He tolerates tecfidera well with mild flushing but no GI symptoms.    He feels mostly stable on Tecfidera but has had some progression of the poor gait.  He now spends much of his time in a scooter.    He had one flare-up last year, though, where he felt gait changed  A few days of IV Solu-Medrol helped to get back to a baseline.  July 2018 he switched to ocrelizumab      REVIEW OF SYSTEMS: Constitutional: No fevers, chills, sweats, or change in appetite Eyes: No visual changes, double vision, eye pain Ear, nose and throat: No hearing loss, ear pain, nasal congestion, sore throat Cardiovascular: No chest pain, palpitations.   Mild edema in legs. Respiratory: No shortness of breath at rest or with exertion.   No wheezes GastrointestinaI: No nausea, vomiting, diarrhea, abdominal pain, fecal incontinence Genitourinary: No dysuria, urinary retention or frequency.  No nocturia. Musculoskeletal: No neck pain, back pain Integumentary: No rash, pruritus, skin lesions  Neurological: as above Psychiatric: as above Endocrine: No palpitations, diaphoresis, change in appetite, change in weigh or increased thirst Hematologic/Lymphatic: No anemia, purpura, petechiae. Allergic/Immunologic: No itchy/runny eyes, nasal congestion, recent allergic reactions, rashes  ALLERGIES: No Known Allergies  HOME MEDICATIONS:  Current Outpatient Medications:  .  baclofen (LIORESAL) 10 MG tablet, Take 1 tablet (10 mg total) by mouth 3 (three) times daily., Disp: 270 tablet, Rfl: 0 .  cephALEXin (KEFLEX) 500 MG capsule, Take 1 capsule (500 mg total) by mouth 3 (three) times daily. (Patient not taking: Reported on 06/21/2019), Disp: 21 capsule, Rfl: 0 .  ENSURE (ENSURE), Take 237 mLs daily by mouth., Disp: , Rfl:  .  ocrelizumab (OCREVUS) 300 MG/10ML injection, Inject 300 mg every 6 (six) months into the vein., Disp: , Rfl:  .  omeprazole (PRILOSEC) 20 MG capsule, Take 20 mg daily as needed by mouth (for acid reflex). , Disp: , Rfl:   PAST MEDICAL HISTORY: Past Medical History:  Diagnosis Date  . GERD (gastroesophageal reflux disease)    had on- off chest pain, symptoms resolved with Prilosec 11/2010  . Lower urinary tract symptoms (LUTS)    Dr Patsi Searsannenbaum  . MS (multiple sclerosis) (HCC) 2005  . Testicular hypogonadism   . Urge incontinence of urine   . Urolithiasis 2011    PAST SURGICAL HISTORY: Past Surgical History:  Procedure Laterality Date  . APPENDECTOMY      ruptured 03-2010  . VASECTOMY      FAMILY HISTORY: Family History  Problem Relation Age of Onset  . Heart attack Mother   . Brain cancer Father   . Colon cancer Neg Hx   . Prostate cancer Neg Hx     SOCIAL HISTORY:  Social History   Socioeconomic History  . Marital status: Married    Spouse name: Not on file  . Number of children: 3  . Years of education: Not on file  . Highest education level: Not on file  Occupational History  . Occupation: still works some from home    Employer:  [employer]  Social Needs  . Financial resource strain: Not on file  . Food insecurity    Worry: Not on file    Inability: Not on file  . Transportation needs    Medical: Not on file    Non-medical: Not on file  Tobacco Use  . Smoking status: Former Games developer  . Smokeless tobacco: Never Used  . Tobacco comment: 3 ppd, quit 1999  Substance and Sexual Activity  . Alcohol use: No    Alcohol/week: 0.0 standard drinks    Comment: rare  . Drug use: No  . Sexual activity: Not on file  Lifestyle  . Physical activity    Days per week: Not on file    Minutes per session: Not on file  . Stress: Not on file  Relationships  . Social Musician on phone: Not on file    Gets together: Not on file    Attends religious service: Not on file    Active member of club or organization: Not on file    Attends meetings of clubs or organizations: Not on file    Relationship status: Not on file  . Intimate partner violence    Fear of current or ex partner: Not on file    Emotionally abused: Not on file    Physically abused: Not on file    Forced sexual activity: Not on file  Other Topics Concern  . Not on file  Social History Narrative   Lives w/ wife      PHYSICAL EXAM  There were no vitals filed for this visit.  There is no height or weight on file to calculate BMI.   General: The patient is well-developed and well-nourished  Skin: Extremities are without rash and he has mild pedal edema.   He has redness in the sacral area with mild ulceration.  Neurologic Exam  Mental status: The patient is alert and oriented x 3 at the time of the examination. The patient has apparent normal recent and remote memory, with a reduced  attention span and concentration ability.   Speech is mildly dysarthric.  Cranial nerves: Extraocular movements are full. .  Facial symmetry is present. Facial strength is normal.  Trapezius and sternocleidomastoid strength is normal. No dysarthria is  noted.  The tongue is midline, and the patient has symmetric elevation of the soft palate. No obvious hearing deficits are noted.  Motor:  Muscle bulk is normal.   Tone is increased, legs > arms, right > left (normal in left arm). Strength is  5 / 5 in left arm, 4+/5 in right arm prox and 4+-5/5 grip, 2/5 in left leg hip extensors and 4-/5 distally, 2-/5 in right leg.   Sensory: Sensory testing is intact to temperature, touch and vibration sensation in  all 4 extremities.  Coordination: Cerebellar testing reveals good finger-nose-finger on the left, reduced on the right  Gait and station:  Can not walk  Reflexes: Deep tendon reflexes are increased, more on the right.       DIAGNOSTIC DATA (LABS, IMAGING, TESTING) - I reviewed patient records, labs, notes, testing and imaging myself where available.  Lab Results  Component Value Date   WBC 9.0 04/03/2018   HGB 13.9 04/10/2018   HCT 41.0 04/10/2018   MCV 88.5 04/03/2018   PLT 287 04/03/2018      Component Value Date/Time   NA 142 04/10/2018 0702   NA 144 05/26/2017 1351   K 4.1 04/10/2018 0702   CL 105 04/10/2018 0702   CO2 21 (L) 04/04/2018 0354   GLUCOSE 99 04/10/2018 0702   BUN 22 (H) 04/10/2018 0702   BUN 22 05/26/2017 1351   CREATININE 0.70 04/10/2018 0702   CALCIUM 9.1 04/04/2018 0354   PROT 6.6 04/03/2018 0407   PROT 7.2 05/26/2017 1351   ALBUMIN 3.5 04/03/2018 0407   ALBUMIN 4.4 05/26/2017 1351   AST 27 04/03/2018 0407   ALT 33 04/03/2018 0407   ALKPHOS 90 04/03/2018 0407   BILITOT 0.4 04/03/2018 0407   BILITOT 0.4 05/26/2017 1351   GFRNONAA >60 04/04/2018 0354   GFRAA >60 04/04/2018 0354    No results found for: [id number] Lab Results  Component Value Date   TSH 0.682 04/02/2018      Richard A. Epimenio FootSater, MD, PhD 06/22/2019, 8:59 AM Certified in Neurology, Clinical Neurophysiology, Sleep Medicine, Pain Medicine and Neuroimaging  [facility] [address], [address] [place], [place]  [id number] [phone]

## 2019-06-24 ENCOUNTER — Telehealth: Payer: Self-pay | Admitting: *Deleted

## 2019-06-24 NOTE — Telephone Encounter (Signed)
Gave completed/signed DMV disability parking placard form back to medical records to process for pt. 

## 2019-06-29 NOTE — Telephone Encounter (Signed)
I mailed pt parking placard today to pt home address.

## 2019-06-30 ENCOUNTER — Telehealth: Payer: Self-pay | Admitting: Internal Medicine

## 2019-06-30 DIAGNOSIS — E041 Nontoxic single thyroid nodule: Secondary | ICD-10-CM

## 2019-06-30 NOTE — Telephone Encounter (Signed)
Order placed.    MyChart message sent.

## 2019-06-30 NOTE — Telephone Encounter (Signed)
Advise patient, he is due for a thyroid ultrasound, DX thyroid nodules.  Please arrange if he is willing to proceed.

## 2019-07-08 ENCOUNTER — Ambulatory Visit: Payer: Medicare Other | Admitting: Neurology

## 2019-07-20 ENCOUNTER — Encounter: Payer: Self-pay | Admitting: Internal Medicine

## 2019-07-23 ENCOUNTER — Other Ambulatory Visit: Payer: Self-pay

## 2019-08-24 ENCOUNTER — Telehealth: Payer: Self-pay | Admitting: Neurology

## 2019-08-24 NOTE — Telephone Encounter (Signed)
Dr. Sater- are you ok with providing this? 

## 2019-08-24 NOTE — Telephone Encounter (Signed)
Pt states the Gym he is wanting to got to is requesting a letter from the provider stating that he can work out in [employer]. Please advise.

## 2019-08-25 NOTE — Telephone Encounter (Signed)
Due to being on Ocrevus and having higher risks -   Would be better if he waited a little longer to go back into a gym

## 2019-08-26 NOTE — Telephone Encounter (Signed)
Dr. Felecia Shelling, please advise  I called pt back and relayed Dr. Garth Bigness message. He states he has not received Ocrevus in over a year. He does not plan on getting Ocrevus at this time. He would like Dr. Felecia Shelling to write letter for him to be able to go back to the gym to work out.  Advised I will send message back to Dr. Felecia Shelling but ultimately, it is Dr. Garth Bigness decision if he provides letter or not. I will call him back to let him know Dr Garth Bigness decision.

## 2019-09-06 ENCOUNTER — Encounter: Payer: Self-pay | Admitting: Neurology

## 2019-09-06 NOTE — Telephone Encounter (Signed)
Called pt. He would like letter mailed. I verified mailing address on file. Placed in mail.

## 2019-09-06 NOTE — Telephone Encounter (Signed)
I printed off a letter and will sign it next time I am in the pod.

## 2019-09-28 DIAGNOSIS — Z23 Encounter for immunization: Secondary | ICD-10-CM | POA: Diagnosis not present

## 2019-11-07 DIAGNOSIS — Z20828 Contact with and (suspected) exposure to other viral communicable diseases: Secondary | ICD-10-CM | POA: Diagnosis not present

## 2019-11-27 ENCOUNTER — Other Ambulatory Visit: Payer: Self-pay

## 2019-11-27 ENCOUNTER — Inpatient Hospital Stay (HOSPITAL_COMMUNITY)
Admission: EM | Admit: 2019-11-27 | Discharge: 2019-12-07 | DRG: 871 | Disposition: A | Discharge status: Rehabilitation Facility | Payer: Medicare Other | Attending: Internal Medicine | Admitting: Internal Medicine

## 2019-11-27 ENCOUNTER — Emergency Department (HOSPITAL_COMMUNITY): Payer: Medicare Other

## 2019-11-27 ENCOUNTER — Encounter (HOSPITAL_COMMUNITY): Payer: Self-pay

## 2019-11-27 ENCOUNTER — Inpatient Hospital Stay (HOSPITAL_COMMUNITY): Payer: Medicare Other

## 2019-11-27 DIAGNOSIS — R05 Cough: Secondary | ICD-10-CM | POA: Diagnosis not present

## 2019-11-27 DIAGNOSIS — R69 Illness, unspecified: Secondary | ICD-10-CM | POA: Diagnosis not present

## 2019-11-27 DIAGNOSIS — B309 Viral conjunctivitis, unspecified: Secondary | ICD-10-CM | POA: Diagnosis present

## 2019-11-27 DIAGNOSIS — R262 Difficulty in walking, not elsewhere classified: Secondary | ICD-10-CM | POA: Diagnosis not present

## 2019-11-27 DIAGNOSIS — E162 Hypoglycemia, unspecified: Secondary | ICD-10-CM | POA: Diagnosis present

## 2019-11-27 DIAGNOSIS — R5381 Other malaise: Secondary | ICD-10-CM | POA: Diagnosis not present

## 2019-11-27 DIAGNOSIS — J189 Pneumonia, unspecified organism: Secondary | ICD-10-CM | POA: Diagnosis not present

## 2019-11-27 DIAGNOSIS — N3000 Acute cystitis without hematuria: Secondary | ICD-10-CM

## 2019-11-27 DIAGNOSIS — N400 Enlarged prostate without lower urinary tract symptoms: Secondary | ICD-10-CM | POA: Diagnosis present

## 2019-11-27 DIAGNOSIS — R531 Weakness: Secondary | ICD-10-CM | POA: Diagnosis not present

## 2019-11-27 DIAGNOSIS — Z87891 Personal history of nicotine dependence: Secondary | ICD-10-CM

## 2019-11-27 DIAGNOSIS — N319 Neuromuscular dysfunction of bladder, unspecified: Secondary | ICD-10-CM | POA: Diagnosis not present

## 2019-11-27 DIAGNOSIS — G801 Spastic diplegic cerebral palsy: Secondary | ICD-10-CM | POA: Diagnosis present

## 2019-11-27 DIAGNOSIS — Z978 Presence of other specified devices: Secondary | ICD-10-CM | POA: Diagnosis not present

## 2019-11-27 DIAGNOSIS — R251 Tremor, unspecified: Secondary | ICD-10-CM | POA: Diagnosis not present

## 2019-11-27 DIAGNOSIS — Z87440 Personal history of urinary (tract) infections: Secondary | ICD-10-CM | POA: Diagnosis not present

## 2019-11-27 DIAGNOSIS — E870 Hyperosmolality and hypernatremia: Secondary | ICD-10-CM | POA: Diagnosis present

## 2019-11-27 DIAGNOSIS — A419 Sepsis, unspecified organism: Secondary | ICD-10-CM | POA: Diagnosis not present

## 2019-11-27 DIAGNOSIS — Z66 Do not resuscitate: Secondary | ICD-10-CM | POA: Diagnosis present

## 2019-11-27 DIAGNOSIS — K219 Gastro-esophageal reflux disease without esophagitis: Secondary | ICD-10-CM | POA: Diagnosis present

## 2019-11-27 DIAGNOSIS — R471 Dysarthria and anarthria: Secondary | ICD-10-CM | POA: Diagnosis present

## 2019-11-27 DIAGNOSIS — K5900 Constipation, unspecified: Secondary | ICD-10-CM | POA: Diagnosis not present

## 2019-11-27 DIAGNOSIS — B951 Streptococcus, group B, as the cause of diseases classified elsewhere: Secondary | ICD-10-CM | POA: Diagnosis present

## 2019-11-27 DIAGNOSIS — N390 Urinary tract infection, site not specified: Secondary | ICD-10-CM | POA: Diagnosis not present

## 2019-11-27 DIAGNOSIS — R270 Ataxia, unspecified: Secondary | ICD-10-CM | POA: Diagnosis present

## 2019-11-27 DIAGNOSIS — E8809 Other disorders of plasma-protein metabolism, not elsewhere classified: Secondary | ICD-10-CM | POA: Diagnosis not present

## 2019-11-27 DIAGNOSIS — R7401 Elevation of levels of liver transaminase levels: Secondary | ICD-10-CM | POA: Diagnosis not present

## 2019-11-27 DIAGNOSIS — J690 Pneumonitis due to inhalation of food and vomit: Secondary | ICD-10-CM | POA: Diagnosis present

## 2019-11-27 DIAGNOSIS — D62 Acute posthemorrhagic anemia: Secondary | ICD-10-CM | POA: Diagnosis present

## 2019-11-27 DIAGNOSIS — R252 Cramp and spasm: Secondary | ICD-10-CM | POA: Diagnosis not present

## 2019-11-27 DIAGNOSIS — T380X5A Adverse effect of glucocorticoids and synthetic analogues, initial encounter: Secondary | ICD-10-CM | POA: Diagnosis not present

## 2019-11-27 DIAGNOSIS — J9601 Acute respiratory failure with hypoxia: Secondary | ICD-10-CM | POA: Diagnosis present

## 2019-11-27 DIAGNOSIS — R918 Other nonspecific abnormal finding of lung field: Secondary | ICD-10-CM | POA: Diagnosis not present

## 2019-11-27 DIAGNOSIS — R6889 Other general symptoms and signs: Secondary | ICD-10-CM

## 2019-11-27 DIAGNOSIS — R7989 Other specified abnormal findings of blood chemistry: Secondary | ICD-10-CM | POA: Diagnosis not present

## 2019-11-27 DIAGNOSIS — R739 Hyperglycemia, unspecified: Secondary | ICD-10-CM | POA: Diagnosis not present

## 2019-11-27 DIAGNOSIS — L89311 Pressure ulcer of right buttock, stage 1: Secondary | ICD-10-CM | POA: Diagnosis present

## 2019-11-27 DIAGNOSIS — N179 Acute kidney failure, unspecified: Secondary | ICD-10-CM | POA: Diagnosis not present

## 2019-11-27 DIAGNOSIS — L89312 Pressure ulcer of right buttock, stage 2: Secondary | ICD-10-CM | POA: Diagnosis present

## 2019-11-27 DIAGNOSIS — Z7189 Other specified counseling: Secondary | ICD-10-CM | POA: Diagnosis not present

## 2019-11-27 DIAGNOSIS — Z20828 Contact with and (suspected) exposure to other viral communicable diseases: Secondary | ICD-10-CM | POA: Diagnosis present

## 2019-11-27 DIAGNOSIS — R0602 Shortness of breath: Secondary | ICD-10-CM | POA: Diagnosis not present

## 2019-11-27 DIAGNOSIS — K592 Neurogenic bowel, not elsewhere classified: Secondary | ICD-10-CM | POA: Diagnosis not present

## 2019-11-27 DIAGNOSIS — Z993 Dependence on wheelchair: Secondary | ICD-10-CM | POA: Diagnosis not present

## 2019-11-27 DIAGNOSIS — G9341 Metabolic encephalopathy: Secondary | ICD-10-CM | POA: Diagnosis not present

## 2019-11-27 DIAGNOSIS — L89321 Pressure ulcer of left buttock, stage 1: Secondary | ICD-10-CM | POA: Diagnosis present

## 2019-11-27 DIAGNOSIS — R6521 Severe sepsis with septic shock: Secondary | ICD-10-CM | POA: Diagnosis present

## 2019-11-27 DIAGNOSIS — Z9119 Patient's noncompliance with other medical treatment and regimen: Secondary | ICD-10-CM | POA: Diagnosis not present

## 2019-11-27 DIAGNOSIS — K5901 Slow transit constipation: Secondary | ICD-10-CM | POA: Diagnosis not present

## 2019-11-27 DIAGNOSIS — R1310 Dysphagia, unspecified: Secondary | ICD-10-CM | POA: Diagnosis present

## 2019-11-27 DIAGNOSIS — R197 Diarrhea, unspecified: Secondary | ICD-10-CM | POA: Diagnosis not present

## 2019-11-27 DIAGNOSIS — F329 Major depressive disorder, single episode, unspecified: Secondary | ICD-10-CM | POA: Diagnosis present

## 2019-11-27 DIAGNOSIS — F331 Major depressive disorder, recurrent, moderate: Secondary | ICD-10-CM | POA: Diagnosis not present

## 2019-11-27 DIAGNOSIS — R0600 Dyspnea, unspecified: Secondary | ICD-10-CM

## 2019-11-27 DIAGNOSIS — I4891 Unspecified atrial fibrillation: Secondary | ICD-10-CM | POA: Diagnosis present

## 2019-11-27 DIAGNOSIS — G35 Multiple sclerosis: Secondary | ICD-10-CM | POA: Diagnosis present

## 2019-11-27 DIAGNOSIS — Z515 Encounter for palliative care: Secondary | ICD-10-CM | POA: Diagnosis not present

## 2019-11-27 DIAGNOSIS — E46 Unspecified protein-calorie malnutrition: Secondary | ICD-10-CM | POA: Diagnosis present

## 2019-11-27 LAB — PHOSPHORUS: Phosphorus: 2.9 mg/dL (ref 2.5–4.6)

## 2019-11-27 LAB — POC SARS CORONAVIRUS 2 AG -  ED: SARS Coronavirus 2 Ag: NEGATIVE

## 2019-11-27 LAB — COMPREHENSIVE METABOLIC PANEL
ALT: 114 U/L — ABNORMAL HIGH (ref 0–44)
AST: 78 U/L — ABNORMAL HIGH (ref 15–41)
Albumin: 3.9 g/dL (ref 3.5–5.0)
Alkaline Phosphatase: 111 U/L (ref 38–126)
Anion gap: 9 (ref 5–15)
BUN: 32 mg/dL — ABNORMAL HIGH (ref 8–23)
CO2: 27 mmol/L (ref 22–32)
Calcium: 11.4 mg/dL — ABNORMAL HIGH (ref 8.9–10.3)
Chloride: 110 mmol/L (ref 98–111)
Creatinine, Ser: 0.9 mg/dL (ref 0.61–1.24)
GFR calc Af Amer: >60 mL/min (ref >60)
GFR calc non Af Amer: >60 mL/min (ref >60)
Glucose, Bld: 79 mg/dL (ref 70–99)
Potassium: 4.9 mmol/L (ref 3.5–5.1)
Sodium: 146 mmol/L — ABNORMAL HIGH (ref 135–145)
Total Bilirubin: 0.4 mg/dL (ref 0.3–1.2)
Total Protein: 7.2 g/dL (ref 6.5–8.1)

## 2019-11-27 LAB — CBC WITH DIFFERENTIAL/PLATELET
Abs Immature Granulocytes: 0.04 10*3/uL (ref 0.00–0.07)
Basophils Absolute: 0 10*3/uL (ref 0.0–0.1)
Basophils Relative: 0 %
Eosinophils Absolute: 0.1 10*3/uL (ref 0.0–0.5)
Eosinophils Relative: 2 %
HCT: 42.1 % (ref 39.0–52.0)
Hemoglobin: 13.8 g/dL (ref 13.0–17.0)
Immature Granulocytes: 1 %
Lymphocytes Relative: 15 %
Lymphs Abs: 0.8 10*3/uL (ref 0.7–4.0)
MCH: 30.8 pg (ref 26.0–34.0)
MCHC: 32.8 g/dL (ref 30.0–36.0)
MCV: 94 fL (ref 80.0–100.0)
Monocytes Absolute: 0.6 10*3/uL (ref 0.1–1.0)
Monocytes Relative: 12 %
Neutro Abs: 3.6 10*3/uL (ref 1.7–7.7)
Neutrophils Relative %: 70 %
Platelets: 128 10*3/uL — ABNORMAL LOW (ref 150–400)
RBC: 4.48 MIL/uL (ref 4.22–5.81)
RDW: 15.3 % (ref 11.5–15.5)
WBC: 5.1 10*3/uL (ref 4.0–10.5)
nRBC: 0 % (ref 0.0–0.2)

## 2019-11-27 LAB — I-STAT CHEM 8, ED
BUN: 30 mg/dL — ABNORMAL HIGH (ref 8–23)
Calcium, Ion: 1.53 mmol/L (ref 1.15–1.40)
Chloride: 112 mmol/L — ABNORMAL HIGH (ref 98–111)
Creatinine, Ser: 0.9 mg/dL (ref 0.61–1.24)
Glucose, Bld: 78 mg/dL (ref 70–99)
HCT: 38 % — ABNORMAL LOW (ref 39.0–52.0)
Hemoglobin: 12.9 g/dL — ABNORMAL LOW (ref 13.0–17.0)
Potassium: 4.6 mmol/L (ref 3.5–5.1)
Sodium: 146 mmol/L — ABNORMAL HIGH (ref 135–145)
TCO2: 29 mmol/L (ref 22–32)

## 2019-11-27 LAB — URINALYSIS, ROUTINE W REFLEX MICROSCOPIC
Bilirubin Urine: NEGATIVE
Glucose, UA: NEGATIVE mg/dL
Ketones, ur: NEGATIVE mg/dL
Nitrite: POSITIVE — AB
Protein, ur: NEGATIVE mg/dL
Specific Gravity, Urine: 1.02 (ref 1.005–1.030)
pH: 7 (ref 5.0–8.0)

## 2019-11-27 LAB — URINALYSIS, MICROSCOPIC (REFLEX)

## 2019-11-27 LAB — MAGNESIUM: Magnesium: 1.8 mg/dL (ref 1.7–2.4)

## 2019-11-27 LAB — BLOOD GAS, ARTERIAL
Acid-Base Excess: 0.5 mmol/L (ref 0.0–2.0)
Bicarbonate: 25.2 mmol/L (ref 20.0–28.0)
O2 Saturation: 95.9 %
Patient temperature: 98.6
pCO2 arterial: 42.8 mmHg (ref 32.0–48.0)
pH, Arterial: 7.387 (ref 7.350–7.450)
pO2, Arterial: 85.7 mmHg (ref 83.0–108.0)

## 2019-11-27 LAB — GLUCOSE, CAPILLARY: Glucose-Capillary: 101 mg/dL — ABNORMAL HIGH (ref 70–99)

## 2019-11-27 LAB — CALCIUM, IONIZED: Calcium, Ionized, Serum: 6.3 mg/dL — ABNORMAL HIGH (ref 4.5–5.6)

## 2019-11-27 LAB — SARS CORONAVIRUS 2 (TAT 6-24 HRS): SARS Coronavirus 2: NEGATIVE

## 2019-11-27 LAB — LACTIC ACID, PLASMA: Lactic Acid, Venous: 0.8 mmol/L (ref 0.5–1.9)

## 2019-11-27 MED ORDER — CHLORHEXIDINE GLUCONATE CLOTH 2 % EX PADS
6.0000 | MEDICATED_PAD | Freq: Every day | CUTANEOUS | Status: DC
  Administered 2019-11-28 – 2019-12-04 (×7): 6 via TOPICAL

## 2019-11-27 MED ORDER — DEXTROSE-NACL 5-0.45 % IV SOLN
INTRAVENOUS | Status: DC
  Administered 2019-11-27 – 2019-11-28 (×2): via INTRAVENOUS

## 2019-11-27 MED ORDER — LORAZEPAM 2 MG/ML IJ SOLN
1.0000 mg | Freq: Once | INTRAMUSCULAR | Status: AC
  Administered 2019-11-27: 1 mg via INTRAVENOUS
  Filled 2019-11-27: qty 1

## 2019-11-27 MED ORDER — SODIUM CHLORIDE 0.9 % IV SOLN
1.0000 g | Freq: Once | INTRAVENOUS | Status: AC
  Administered 2019-11-27: 1 g via INTRAVENOUS
  Filled 2019-11-27: qty 10

## 2019-11-27 MED ORDER — CHLORHEXIDINE GLUCONATE 0.12 % MT SOLN
15.0000 mL | Freq: Two times a day (BID) | OROMUCOSAL | Status: DC
  Administered 2019-11-27 – 2019-11-30 (×7): 15 mL via OROMUCOSAL
  Filled 2019-11-27 (×4): qty 15

## 2019-11-27 MED ORDER — ONDANSETRON HCL 4 MG/2ML IJ SOLN: 4.0000 mg | Freq: Four times a day (QID) | INTRAMUSCULAR | Status: DC | PRN

## 2019-11-27 MED ORDER — DILTIAZEM LOAD VIA INFUSION
10.0000 mg | Freq: Once | INTRAVENOUS | Status: DC
  Filled 2019-11-27: qty 10

## 2019-11-27 MED ORDER — ORAL CARE MOUTH RINSE
15.0000 mL | Freq: Two times a day (BID) | OROMUCOSAL | Status: DC
  Administered 2019-11-28 – 2019-11-30 (×6): 15 mL via OROMUCOSAL

## 2019-11-27 MED ORDER — ACETAMINOPHEN 325 MG PO TABS: 650.0000 mg | ORAL_TABLET | Freq: Four times a day (QID) | ORAL | Status: DC | PRN

## 2019-11-27 MED ORDER — SODIUM CHLORIDE 0.9 % IV BOLUS
1000.0000 mL | Freq: Once | INTRAVENOUS | Status: AC
  Administered 2019-11-27: 1000 mL via INTRAVENOUS

## 2019-11-27 MED ORDER — ONDANSETRON HCL 4 MG PO TABS: 4.0000 mg | ORAL_TABLET | Freq: Four times a day (QID) | ORAL | Status: DC | PRN

## 2019-11-27 MED ORDER — DILTIAZEM HCL-DEXTROSE 125-5 MG/125ML-% IV SOLN (PREMIX)
5.0000 mg/h | INTRAVENOUS | Status: DC
  Filled 2019-11-27 (×2): qty 125

## 2019-11-27 MED ORDER — ACETAMINOPHEN 650 MG RE SUPP: 650.0000 mg | Freq: Four times a day (QID) | RECTAL | Status: DC | PRN

## 2019-11-27 MED ORDER — SODIUM CHLORIDE 0.9 % IV SOLN
1.0000 g | INTRAVENOUS | Status: DC
  Filled 2019-11-27: qty 10

## 2019-11-27 MED ORDER — LORAZEPAM 2 MG/ML IJ SOLN
1.0000 mg | Freq: Once | INTRAMUSCULAR | Status: AC
  Administered 2019-11-27: 15:00:00 1 mg via INTRAVENOUS
  Filled 2019-11-27: qty 1

## 2019-11-27 MED ORDER — GADOBUTROL 1 MMOL/ML IV SOLN
10.0000 mL | Freq: Once | INTRAVENOUS | Status: AC | PRN
  Administered 2019-11-27: 16:00:00 10 mL via INTRAVENOUS

## 2019-11-27 MED ORDER — ENOXAPARIN SODIUM 40 MG/0.4ML ~~LOC~~ SOLN
40.0000 mg | SUBCUTANEOUS | Status: DC
  Administered 2019-11-27 – 2019-12-06 (×10): 40 mg via SUBCUTANEOUS
  Filled 2019-11-27 (×10): qty 0.4

## 2019-11-27 NOTE — ED Notes (Signed)
ED TO INPATIENT HANDOFF REPORT  Name/Age/Gender Stephen Wade 68 y.o. male  Code Status Code Status History    Date Active Date Inactive Code Status Order ID Comments User Context   04/02/2018 1515 04/06/2018 1609 Full Code [id number]  Elodia Florence., MD Inpatient   11/04/2017 1439 11/06/2017 1729 Full Code [id number]  Donne Hazel, MD ED   Advance Care Planning Activity      Home/SNF/Other Home  Chief Complaint lack of mobility  Level of Care/Admitting Diagnosis ED Disposition    ED Disposition Condition [facility]: [facility] [[id number]]  Level of Care: Telemetry [5]  Admit to tele based on following criteria: Complex arrhythmia (Bradycardia/Tachycardia)  Covid Evaluation: Confirmed COVID Negative  Diagnosis: Acute metabolic encephalopathy [[id number]]  Admitting Physician: RAI, [place] K [[id number]]  Attending Physician: RAI, RIPUDEEP K [[id number]]  Estimated length of stay: past midnight tomorrow  Certification:: I certify this patient will need inpatient services for at least 2 midnights  PT Class (Do Not Modify): Inpatient [101]  PT Acc Code (Do Not Modify): Private [1]       Medical History Past Medical History:  Diagnosis Date  . GERD (gastroesophageal reflux disease)    had on- off chest pain, symptoms resolved with Prilosec 11/2010  . Lower urinary tract symptoms (LUTS)    Dr Gaynelle Arabian  . MS (multiple sclerosis) ([place]) 2005  . Testicular hypogonadism   . Urge incontinence of urine   . Urolithiasis 2011    Allergies No Known Allergies  IV Location/Drains/Wounds Patient Lines/Drains/Airways Status   Active Line/Drains/Airways    Name:   Placement date:   Placement time:   Site:   Days:   Peripheral IV 11/27/19 Left Forearm   11/27/19    1426    Forearm   less than 1   Pressure Injury 04/10/18 Stage I -  Intact skin with non-blanchable redness of a localized area usually over a bony prominence. redness with small  blistered areas to mid bilateral buttocks   04/10/18    0336     596          Labs/Imaging Results for orders placed or performed during the hospital encounter of 11/27/19 (from the past 48 hour(s))  CBC with Differential     Status: Abnormal   Collection Time: 11/27/19  6:51 AM  Result Value Ref Range   WBC 5.1 4.0 - 10.5 K/uL   RBC 4.48 4.22 - 5.81 MIL/uL   Hemoglobin 13.8 13.0 - 17.0 g/dL   HCT 42.1 39.0 - 52.0 %   MCV 94.0 80.0 - 100.0 fL   MCH 30.8 26.0 - 34.0 pg   MCHC 32.8 30.0 - 36.0 g/dL   RDW 15.3 11.5 - 15.5 %   Platelets 128 (L) 150 - 400 K/uL    Comment: REPEATED TO VERIFY PLATELET COUNT CONFIRMED BY SMEAR SPECIMEN CHECKED FOR CLOTS    nRBC 0.0 0.0 - 0.2 %   Neutrophils Relative % 70 %   Neutro Abs 3.6 1.7 - 7.7 K/uL   Lymphocytes Relative 15 %   Lymphs Abs 0.8 0.7 - 4.0 K/uL   Monocytes Relative 12 %   Monocytes Absolute 0.6 0.1 - 1.0 K/uL   Eosinophils Relative 2 %   Eosinophils Absolute 0.1 0.0 - 0.5 K/uL   Basophils Relative 0 %   Basophils Absolute 0.0 0.0 - 0.1 K/uL   Immature Granulocytes 1 %   Abs Immature Granulocytes 0.04 0.00 -  0.07 K/uL    Comment: Performed at [facility], [place] [address], [place], [place] [address]  I-stat chem 8, ED (not at [facility] or [facility])     Status: Abnormal   Collection Time: 11/27/19  6:58 AM  Result Value Ref Range   Sodium 146 (H) 135 - 145 mmol/L   Potassium 4.6 3.5 - 5.1 mmol/L   Chloride 112 (H) 98 - 111 mmol/L   BUN 30 (H) 8 - 23 mg/dL   Creatinine, Ser 0.90 0.61 - 1.24 mg/dL   Glucose, Bld 78 70 - 99 mg/dL   Calcium, Ion 1.53 (HH) 1.15 - 1.40 mmol/L   TCO2 29 22 - 32 mmol/L   Hemoglobin 12.9 (L) 13.0 - 17.0 g/dL   HCT 38.0 (L) 39.0 - 52.0 %   Comment NOTIFIED PHYSICIAN   Comprehensive metabolic panel     Status: Abnormal   Collection Time: 11/27/19  7:43 AM  Result Value Ref Range   Sodium 146 (H) 135 - 145 mmol/L   Potassium 4.9 3.5 - 5.1 mmol/L   Chloride 110 98 - 111 mmol/L   CO2 27 22  - 32 mmol/L   Glucose, Bld 79 70 - 99 mg/dL   BUN 32 (H) 8 - 23 mg/dL   Creatinine, Ser 0.90 0.61 - 1.24 mg/dL   Calcium 11.4 (H) 8.9 - 10.3 mg/dL   Total Protein 7.2 6.5 - 8.1 g/dL   Albumin 3.9 3.5 - 5.0 g/dL   AST 78 (H) 15 - 41 U/L   ALT 114 (H) 0 - 44 U/L   Alkaline Phosphatase 111 38 - 126 U/L   Total Bilirubin 0.4 0.3 - 1.2 mg/dL   GFR calc non Af Amer >60 >60 mL/min   GFR calc Af Amer >60 >60 mL/min   Anion gap 9 5 - 15    Comment: Performed at [facility], [place] [address] [place], [place] [address]  Magnesium     Status: None   Collection Time: 11/27/19  7:43 AM  Result Value Ref Range   Magnesium 1.8 1.7 - 2.4 mg/dL    Comment: Performed at [facility], [place] [address] [place], [place] [address]  Phosphorus     Status: None   Collection Time: 11/27/19  7:43 AM  Result Value Ref Range   Phosphorus 2.9 2.5 - 4.6 mg/dL    Comment: Performed at [facility], [place] [address] [place], [place] [address]  Calcium, ionized     Status: Abnormal   Collection Time: 11/27/19  7:43 AM  Result Value Ref Range   Calcium, Ionized, Serum 6.3 (H) 4.5 - 5.6 mg/dL    Comment: (NOTE) Performed At: [facility] [place], [place] [id number] Rush Farmer MD HT:[id number]   SARS CORONAVIRUS 2 (TAT 6-24 HRS) Nasopharyngeal Nasopharyngeal Swab     Status: None   Collection Time: 11/27/19  7:51 AM   Specimen: Nasopharyngeal Swab  Result Value Ref Range   SARS Coronavirus 2 NEGATIVE NEGATIVE    Comment: (NOTE) SARS-CoV-2 target nucleic acids are NOT DETECTED. The SARS-CoV-2 RNA is generally detectable in upper and lower respiratory specimens during the acute phase of infection. Negative results do not preclude SARS-CoV-2 infection, do not rule out co-infections with other pathogens, and should not be used as the sole basis for treatment or other patient management decisions. Negative results must be combined  with clinical observations, patient history, and epidemiological information. The expected result is Negative.  Fact Sheet for Patients: SugarRoll.be Fact Sheet for Healthcare Providers: https://www.woods-mathews.com/ This test is not yet approved or cleared by the [place] FDA and  has been authorized for detection and/or diagnosis of SARS-CoV-2 by FDA under an Emergency Use Authorization (EUA). This EUA will remain  in effect (meaning this test can be used) for the duration of the COVID-19 declaration under Section 56 4(b)(1) of the Act, 21 U.S.C. section 360bbb-3(b)(1), unless the authorization is terminated or revoked sooner. Performed at [facility], [place] [address] [place], [place] [address]   POC SARS Coronavirus 2 Ag-ED - Nasal Swab (BD Veritor Kit)     Status: None   Collection Time: 11/27/19  8:18 AM  Result Value Ref Range   SARS Coronavirus 2 Ag NEGATIVE NEGATIVE    Comment: (NOTE) SARS-CoV-2 antigen NOT DETECTED.  Negative results are presumptive.  Negative results do not preclude SARS-CoV-2 infection and should not be used as the sole basis for treatment or other patient management decisions, including infection  control decisions, particularly in the presence of clinical signs and  symptoms consistent with COVID-19, or in those who have been in contact with the virus.  Negative results must be combined with clinical observations, patient history, and epidemiological information. The expected result is Negative. Fact Sheet for Patients: PodPark.tn Fact Sheet for Healthcare Providers: GiftContent.is This test is not yet approved or cleared by the [place] FDA and  has been authorized for detection and/or diagnosis of SARS-CoV-2 by FDA under an Emergency Use Authorization (EUA).  This EUA will remain in effect (meaning this test can be used) for the  duration of  the COVID-19 de claration under Section 564(b)(1) of the Act, 21 U.S.C. section 360bbb-3(b)(1), unless the authorization is terminated or revoked sooner.   Urinalysis, Routine w reflex microscopic     Status: Abnormal   Collection Time: 11/27/19  9:49 AM  Result Value Ref Range   Color, Urine YELLOW YELLOW   APPearance HAZY (A) CLEAR   Specific Gravity, Urine 1.020 1.005 - 1.030   pH 7.0 5.0 - 8.0   Glucose, UA NEGATIVE NEGATIVE mg/dL   Hgb urine dipstick SMALL (A) NEGATIVE   Bilirubin Urine NEGATIVE NEGATIVE   Ketones, ur NEGATIVE NEGATIVE mg/dL   Protein, ur NEGATIVE NEGATIVE mg/dL   Nitrite POSITIVE (A) NEGATIVE   Leukocytes,Ua LARGE (A) NEGATIVE    Comment: Performed at [facility], [place] [address] [place], [place] [address]  Urinalysis, Microscopic (reflex)     Status: Abnormal   Collection Time: 11/27/19  9:49 AM  Result Value Ref Range   RBC / HPF 0-5 0 - 5 RBC/hpf   WBC, UA 11-20 0 - 5 WBC/hpf   Bacteria, UA FEW (A) NONE SEEN   Squamous Epithelial / LPF 0-5 0 - 5   WBC Clumps PRESENT    Amorphous Crystal PRESENT     Comment: Performed at [facility], [place] [address], [place], [place] [address]  Lactic acid, plasma     Status: None   Collection Time: 11/27/19  4:43 PM  Result Value Ref Range   Lactic Acid, Venous 0.8 0.5 - 1.9 mmol/L    Comment: Performed at [facility], [place] [address], [place], [place] [address]   Ct Head Wo Contrast  Result Date: 11/27/2019 CLINICAL DATA:  Increased weakness EXAM: CT HEAD WITHOUT CONTRAST TECHNIQUE: Contiguous axial images were obtained from the base of the skull through the vertex without intravenous contrast.  COMPARISON:  Brain MRI 11/04/2017 FINDINGS: Brain: No acute intracranial hemorrhage. No focal mass lesion. No CT evidence of acute infarction. No midline shift or mass effect. No hydrocephalus. Basilar cisterns are patent. Vascular: No hyperdense vessel or  unexpected calcification. Skull: Normal. Negative for fracture or focal lesion. Sinuses/Orbits: Paranasal sinuses and mastoid air cells are clear. Orbits are clear. Other: None. IMPRESSION: No acute intracranial findings. Electronically Signed   By: Suzy Bouchard M.D.   On: 11/27/2019 09:03   Mr Jeri Cos And Wo Contrast  Result Date: 11/27/2019 CLINICAL DATA:  68 year old male with multiple sclerosis. Increasing right side weakness and limb ataxia. EXAM: MRI HEAD WITHOUT AND WITH CONTRAST TECHNIQUE: Multiplanar, multiecho pulse sequences of the brain and surrounding structures were obtained without and with intravenous contrast. CONTRAST:  32m GADAVIST GADOBUTROL 1 MMOL/ML IV SOLN COMPARISON:  Brain, cervical and thoracic spine MRI 11/05/2017. FINDINGS: Brain: No restricted diffusion to suggest acute infarction. No midline shift, mass effect, evidence of mass lesion, ventriculomegaly, extra-axial collection or acute intracranial hemorrhage. Cervicomedullary junction and pituitary are within normal limits. Stable cerebral volume since 2018. Scattered periventricular and other cerebral white matter T2 and FLAIR hyperintensity including an area of chronic cystic encephalomalacia in the right corona radiata. Possible mild chronic cortical encephalomalacia in the left motor strip series 6, image 21 (right hand representation). Aside from the right corona radiata abnormality tracking into the right deep gray nuclei, the deep gray matter appears spared. There is chronic brainstem and cerebellar volume loss. Comparatively mild chronic corpus callosum volume loss. No new or progressive lesions identified. No abnormal enhancement identified. No dural thickening. Vascular: Major intracranial vascular flow voids are stable. Major dural venous sinuses are enhancing and appear to be patent. Skull and upper cervical spine: Grossly negative cervical spine. Visualized bone marrow signal is within normal limits.  Sinuses/Orbits: Grossly negative orbits. Paranasal sinuses and mastoids are stable and well pneumatized. Other: Visible internal auditory structures appear normal. IMPRESSION: 1. Stable MRI appearance of the brain since 2018 with changes compatible with chronic demyelination. Chronic brainstem volume loss. No acute demyelination or progressive disease identified. No new or progressive intracranial abnormality. 2. No new intracranial abnormality. Electronically Signed   By: HGenevie AnnM.D.   On: 11/27/2019 16:35   Dg Chest Portable 1 View  Result Date: 11/27/2019 CLINICAL DATA:  Cough. EXAM: PORTABLE CHEST 1 VIEW COMPARISON:  04/02/2018 FINDINGS: Normal heart size. Decreased lung volumes. No pleural effusion or edema. No airspace densities. IMPRESSION: 1. No acute cardiopulmonary abnormalities. 2. Low lung volumes. Electronically Signed   By: TKerby MoorsM.D.   On: 11/27/2019 08:55    Pending Labs Unresulted Labs (From admission, onward)    Start     Ordered   11/27/19 1803  Culture, blood (routine x 2)  BLOOD CULTURE X 2,   R (with STAT occurrences)     11/27/19 1802   11/27/19 1706  Urine culture  ONCE - STAT,   STAT     11/27/19 1705   Signed and Held  HIV Antibody (routine testing w rflx)  (HIV Antibody (Routine testing w reflex) panel)  Once,   R     Signed and Held   Signed and Held  Basic metabolic panel  Tomorrow morning,   R     Signed and Held   Signed and Held  CBC  Tomorrow morning,   R     Signed and Held   Signed and Held  CBC  (enoxaparin (LOVENOX)  CrCl >/= 30 ml/min)  Once,   R    Comments: Baseline for enoxaparin therapy IF NOT ALREADY DRAWN.  Notify MD if PLT < 100 K.    Signed and Held   Signed and Held  Creatinine, serum  (enoxaparin (LOVENOX)    CrCl >/= 30 ml/min)  Once,   R    Comments: Baseline for enoxaparin therapy IF NOT ALREADY DRAWN.    Signed and Held   Signed and Held  Creatinine, serum  (enoxaparin (LOVENOX)    CrCl >/= 30 ml/min)  Weekly,   R     Comments: while on enoxaparin therapy    Signed and Held   Signed and Held  Urine culture  Once,   R     Signed and Held          Vitals/Pain Today's Vitals   11/27/19 1715 11/27/19 1730 11/27/19 1830 11/27/19 2009  BP:  132/69 116/87 107/68  Pulse: 73 79 78 87  Resp:    20  Temp:      TempSrc:      SpO2: 96% 92% 95% 93%  PainSc:        Isolation Precautions No active isolations  Medications Medications  LORazepam (ATIVAN) injection 1 mg (1 mg Intravenous Given 11/27/19 1426)  cefTRIAXone (ROCEPHIN) 1 g in sodium chloride 0.9 % 100 mL IVPB (0 g Intravenous Stopped 11/27/19 1520)  LORazepam (ATIVAN) injection 1 mg (1 mg Intravenous Given 11/27/19 1520)  gadobutrol (GADAVIST) 1 MMOL/ML injection 10 mL (10 mLs Intravenous Contrast Given 11/27/19 1604)    Mobility non-ambulatory

## 2019-11-27 NOTE — Progress Notes (Signed)
Paged by bedside RN regarding pts mental status, hypothermia, tachycardia, and increased O2 demands. Upon entering the room rapid response RN and bedside RN present.  Patient's heart rate into the 120s to 140s. EKG obtained showing A. fib with RVR.  BP stable at that time with a SBP into the 100s.  Patient also hypothermic with a rectal temp of 91.9.  Also with increased O2 demands have gone from 2 L nasal cannula to nonrebreather with O2 sats at 92% upon my arrival.  Patient drowsy but arousable oriented to self and year, grip +2 on the left and +1 on the right, able to wiggle toes, and pupils equal reactive.  Previous head CT and brain MRI obtained today were negative.  Mental status appears improved compared to earlier we will hold off on repeat CT scan at this time.   Transfer patient to stepdown and placed patient on bear hugger patient's temp up to 93 at last check.  Cardizem drip also ordered but before initiation patient back into sinus rhythm/sinus tach with heart rate in the low 100s.  Upon arrival to stepdown patient's BP 96/54 with a map of 68 ordered a 1 L NS bolus to be given over 2 hours.  Patient's lung sounds coarse and patient unable to cough on command.  Chest x-ray obtained showing a new infiltrate in the right base that may represent pneumonia or aspiration.  ABG obtained as well: pH: 7.37, PCO2: 42.8, PO2: 85.7, O2 sat: 95.9.  Will wean nonrebreather as tolerated monitor respiratory status closely.  Arby Barrette, AGPCNP-BC, AGNP-C [facility] Pager [phone]

## 2019-11-27 NOTE — ED Notes (Signed)
He is drowsy and in no distress. His wife was here and saw him after his return from MRI. He has a small quantity of oral secretions, which I assist him with with a yankauer suction device.

## 2019-11-27 NOTE — ED Provider Notes (Signed)
[facility] DEPT Provider Note   CSN: [id number] Arrival date & time: 11/27/19  0410     History   Chief Complaint Chief Complaint  Patient presents with  . Weakness    HPI Stephen Wade is a 68 y.o. male.     HPI   68 year old male with a history of MS with spastic diplegia, nonambulatory using motorized scooter at home, presents with concern for 1 week of increasing generalized weakness and right upper extremity weakness, with new inability to assist with transfers.  Reports that typically he is able to push himself up, and assist with transfers with his upper extremities, however over the last week he is unable to do so.  Reports of the right-sided weakness appears to be worse than the left.  At baseline, his right lower extremity is worse than his left, but reports that this is new her right upper extremity weakness, notes that he is has a new tremor develop in the right upper extremity.  He feels his speech is slowed.  Denies significant change in vision, reports sometimes has difficulty tolerating a close or far way. Has hx of double vision in past at times not current.  Denies fevers, chest pain, significant dyspnea, vomiting, nausea and congestion. Reports decreased appetite and change of taste over the last few days.  Mild cough, has had worse in the past. 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 Wife confirms home health aid tested positive but they have been away for some time-neither have had symptoms (until his recent change) and tested negative 2 weeks ago.    Past Medical History:  Diagnosis Date  . GERD (gastroesophageal reflux disease)    had on- off chest pain, symptoms resolved with Prilosec 11/2010  . Lower urinary tract symptoms (LUTS)    Dr Gaynelle Arabian  . MS (multiple sclerosis) ([place]) 2005  . Testicular  hypogonadism   . Urge incontinence of urine   . Urolithiasis 2011    Patient Active Problem List   Diagnosis Date Noted  . Urinary retention 06/22/2019  . PCP NOTES >>>>>>> 05/24/2018  . Depression, recurrent ([place]) 05/24/2018  . History of recurrent UTIs 05/22/2018  . Pressure injury of skin 11/06/2017  . Multiple sclerosis ([place]) 11/04/2017  . Skin ulcer of sacral region ([place]) 05/26/2017  . Vitamin D deficiency 01/16/2016  . Spastic gait 02/10/2015  . Spastic diplegia ([place]) 02/10/2015  . Other fatigue 02/10/2015  . Disturbed cognition 02/10/2015  . Edema 05/12/2014  . Hypogonadism male 03/11/2013  . BPH - self caths 12/26/2011  . GERD 11/30/2010  . URINARY CALCULUS 10/05/2010    Past Surgical History:  Procedure Laterality Date  . APPENDECTOMY      ruptured 03-2010  . VASECTOMY          Home Medications    Prior to Admission medications   Medication Sig Start Date End Date Taking? Authorizing Provider  baclofen (LIORESAL) 10 MG tablet Take 1 tablet (10 mg total) by mouth 3 (three) times daily. 03/04/19  Yes Sater, Nanine Means, MD  omeprazole (PRILOSEC) 20 MG capsule Take 20 mg daily as needed by mouth (for acid reflex).    Yes [provider]  ocrelizumab (OCREVUS) 300 MG/10ML injection Inject 300 mg every 6 (six) months into the vein.    [provider]    Family History Family History  Problem  Relation Age of Onset  . Heart attack Mother   . Brain cancer Father   . Colon cancer Neg Hx   . Prostate cancer Neg Hx     Social History Social History   Tobacco Use  . Smoking status: Former Games developermoker  . Smokeless tobacco: Never Used  . Tobacco comment: 3 ppd, quit 1999  Substance Use Topics  . Alcohol use: No    Alcohol/week: 0.0 standard drinks    Comment: rare  . Drug use: No     Allergies   Patient has no known allergies.   Review of Systems Review of Systems  Constitutional: Positive for appetite change and fatigue. Negative for  fever.  HENT: Negative for congestion.   Eyes: Negative for visual disturbance.  Respiratory: Positive for cough. Negative for shortness of breath.   Cardiovascular: Negative for chest pain and leg swelling.  Gastrointestinal: Negative for abdominal pain, diarrhea, nausea and vomiting. Constipation: small round stool for one month.  Musculoskeletal: Negative for back pain.  Skin: Negative for rash.  Neurological: Positive for speech difficulty and weakness. Negative for dizziness, facial asymmetry, numbness and headaches.     Physical Exam Updated Vital Signs BP 131/77   Pulse 69   Temp 98 F (36.7 C) (Oral)   Resp 17   SpO2 90%   Physical Exam Vitals signs and nursing note reviewed.  Constitutional:      General: He is not in acute distress.    Appearance: He is well-developed. He is not diaphoretic.  HENT:     Head: Normocephalic and atraumatic.  Eyes:     General: No visual field deficit.    Conjunctiva/sclera: Conjunctivae normal.  Neck:     Musculoskeletal: Normal range of motion.  Cardiovascular:     Rate and Rhythm: Normal rate and regular rhythm.     Heart sounds: Normal heart sounds. No murmur. No friction rub. No gallop.   Pulmonary:     Effort: Pulmonary effort is normal. No respiratory distress.     Breath sounds: Normal breath sounds. No wheezing or rales.  Abdominal:     General: There is no distension.     Palpations: Abdomen is soft.     Tenderness: There is no abdominal tenderness. There is no guarding.  Skin:    General: Skin is warm and dry.  Neurological:     Mental Status: He is alert and oriented to person, place, and time.     GCS: GCS eye subscore is 4. GCS verbal subscore is 5. GCS motor subscore is 6.     Cranial Nerves: No cranial nerve deficit, dysarthria or facial asymmetry.     Sensory: No sensory deficit.     Motor: Weakness and tremor present. Pronator drift: slight however tremor present.     Coordination: Finger-Nose-Finger Test  abnormal.     Comments: Slow speech Tremor RUE R>L LE weakness (chronic)-1/5 R>L UE weakness, new per  Pt, tremor new per pt Limb ataxia RUE      ED Treatments / Results  Labs (all labs ordered are listed, but only abnormal results are displayed) Labs Reviewed  CBC WITH DIFFERENTIAL/PLATELET - Abnormal; Notable for the following components:      Result Value   Platelets 128 (*)    All other components within normal limits  URINALYSIS, ROUTINE W REFLEX MICROSCOPIC - Abnormal; Notable for the following components:   APPearance HAZY (*)    Hgb urine dipstick SMALL (*)    Nitrite POSITIVE (*)  Leukocytes,Ua LARGE (*)    All other components within normal limits  COMPREHENSIVE METABOLIC PANEL - Abnormal; Notable for the following components:   Sodium 146 (*)    BUN 32 (*)    Calcium 11.4 (*)    AST 78 (*)    ALT 114 (*)    All other components within normal limits  URINALYSIS, MICROSCOPIC (REFLEX) - Abnormal; Notable for the following components:   Bacteria, UA FEW (*)    All other components within normal limits  I-STAT CHEM 8, ED - Abnormal; Notable for the following components:   Sodium 146 (*)    Chloride 112 (*)    BUN 30 (*)    Calcium, Ion 1.53 (*)    Hemoglobin 12.9 (*)    HCT 38.0 (*)    All other components within normal limits  SARS CORONAVIRUS 2 (TAT 6-24 HRS)  MAGNESIUM  PHOSPHORUS  CALCIUM, IONIZED  LACTIC ACID, PLASMA  LACTIC ACID, PLASMA  POC SARS CORONAVIRUS 2 AG -  ED    EKG None  Radiology Ct Head Wo Contrast  Result Date: 11/27/2019 CLINICAL DATA:  Increased weakness EXAM: CT HEAD WITHOUT CONTRAST TECHNIQUE: Contiguous axial images were obtained from the base of the skull through the vertex without intravenous contrast. COMPARISON:  Brain MRI 11/04/2017 FINDINGS: Brain: No acute intracranial hemorrhage. No focal mass lesion. No CT evidence of acute infarction. No midline shift or mass effect. No hydrocephalus. Basilar cisterns are patent.  Vascular: No hyperdense vessel or unexpected calcification. Skull: Normal. Negative for fracture or focal lesion. Sinuses/Orbits: Paranasal sinuses and mastoid air cells are clear. Orbits are clear. Other: None. IMPRESSION: No acute intracranial findings. Electronically Signed   By: Genevive Bi M.D.   On: 11/27/2019 09:03   Dg Chest Portable 1 View  Result Date: 11/27/2019 CLINICAL DATA:  Cough. EXAM: PORTABLE CHEST 1 VIEW COMPARISON:  04/02/2018 FINDINGS: Normal heart size. Decreased lung volumes. No pleural effusion or edema. No airspace densities. IMPRESSION: 1. No acute cardiopulmonary abnormalities. 2. Low lung volumes. Electronically Signed   By: Signa Kell M.D.   On: 11/27/2019 08:55    Procedures Procedures (including critical care time)  Medications Ordered in ED Medications  LORazepam (ATIVAN) injection 1 mg (1 mg Intravenous Given 11/27/19 1426)  cefTRIAXone (ROCEPHIN) 1 g in sodium chloride 0.9 % 100 mL IVPB (1 g Intravenous New Bag/Given 11/27/19 1426)  LORazepam (ATIVAN) injection 1 mg (1 mg Intravenous Given 11/27/19 1520)  gadobutrol (GADAVIST) 1 MMOL/ML injection 10 mL (10 mLs Intravenous Contrast Given 11/27/19 1604)     Initial Impression / Assessment and Plan / ED Course  I have reviewed the triage vital signs and the nursing notes.  Pertinent labs & imaging results that were available during my care of the patient were reviewed by me and considered in my medical decision making (see chart for details).        68 year old male with a history of MS with spastic diplegia, nonambulatory using motorized scooter at home, presents with concern for 1 week of increasing generalized weakness and right upper extremity weakness/tremor, with new inability to assist with transfers.  Labs significant for mild hypercalcemia.    POC covid negative however PCR pending.  Potential exposure with home health aid, tested negative 11/15 after exposure, does have very mild cough and  some taste changes and would maintain as PUI although lower suspicion.  Labs also significant for UTI and wife reports he has had similar generalized weakness with urinary tract infections  in the past.  Given significant change in functional abilities in setting of infection, will plan on admission for UTI, however given some focal neurologic changes, [facility] for MS flare in setting of UTI versus less likely CVA.    Final Clinical Impressions(s) / ED Diagnoses   Final diagnoses:  Generalized weakness  Acute cystitis without hematuria  Tremor  Right sided weakness  MS (multiple sclerosis) [facility](HCC)    ED Discharge Orders    None       Alvira MondaySchlossman, Unnamed Zeien, MD 11/27/19 1616

## 2019-11-27 NOTE — H&P (Addendum)
History and Physical        Hospital Admission Note Date: 11/27/2019  Patient name: Stephen Wade Medical record number: [id number] Date of birth: 12/28/1950 Age: 68 y.o. Gender: male  PCP: Colon Branch, MD    Patient coming from: Home  I have reviewed all records in the [facility].    Chief Complaint:  Confused, worsening weakness, mild cough in the last 1 week  HPI: Patient is a 68 year old male with history of MS, GERD, recurrent UTIs, self caths was brought via EMS for concern of worsening weakness, confusion in the last 1 week.  At the time of my examination, patient is extremely drowsy, unable to respond to any commands, had received Ativan IV 1 mg x 2 prior to my encounter for the MRI brain.  History was obtained from patient's wife over the phone.  She reported that at baseline he is wheelchair-bound, has right-sided weakness worse than the left due to MS.  She noticed that in the last 1 week he has been getting more confused, very weak, unable to assist with transfers.  She noticed that initially his speech was slow, then noticed to be slurred and drooling.  Patient's wife noticed that the right-sided weakness was more worse than baseline.  No fevers or chills, chest pain or significant dyspnea, nausea or vomiting.  Wife did notice decreased appetite, possible change of taste over the last few days with mild coughing.  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  Both the patient and his wife had tested negative on 11/18.  Patient's wife denied any other exposures.  Point-of-care COVID-19 test negative, confirmatory test pending  ED work-up/course:  Vital signs temp 98, respiratory rate 13, pulse 79, BP 132/69, at the time of my examination O2 sats 92% on 3 L.  Wife reports that he is not on any O2 at home.  Sodium 146, potassium 4.9, CO2 27, BUN  32, creatinine 0.9 ionized calcium 1.53 UA grossly positive for UTI, hemoglobin 12.9, WBCs 5.1 CT head with no acute intracranial findings MRI of the brain stable appearance since 2018 with changes compatible with chronic demyelination.  No acute demyelination of progressive disease identified, no new or progressive intracranial abnormality.   Review of Systems: Positives marked in 'bold' Unable to obtain from the patient, due to his mental status  Past Medical History: Past Medical History:  Diagnosis Date  . GERD (gastroesophageal reflux disease)    had on- off chest pain, symptoms resolved with Prilosec 11/2010  . Lower urinary tract symptoms (LUTS)    Dr Gaynelle Arabian  . MS (multiple sclerosis) ([place]) 2005  . Testicular hypogonadism   . Urge incontinence of urine   . Urolithiasis 2011    Past Surgical History:  Procedure Laterality Date  . APPENDECTOMY      ruptured 03-2010  . VASECTOMY      Medications: Prior to Admission medications   Medication Sig Start Date End Date Taking? Authorizing Provider  baclofen (LIORESAL) 10 MG tablet Take 1 tablet (10 mg total) by mouth 3 (three) times daily. 03/04/19  Yes Sater, Nanine Means, MD  omeprazole (PRILOSEC) 20 MG capsule  Take 20 mg daily as needed by mouth (for acid reflex).    Yes [provider]  ocrelizumab (OCREVUS) 300 MG/10ML injection Inject 300 mg every 6 (six) months into the vein.    [provider]    Allergies:  No Known Allergies  Social History: Per chart review, reports that he has quit smoking. He has never used smokeless tobacco. He reports that he does not drink alcohol or use drugs.  Family History: Family History  Problem Relation Age of Onset  . Heart attack Mother   . Brain cancer Father   . Colon cancer Neg Hx   . Prostate cancer Neg Hx     Physical Exam: Blood pressure 132/69, pulse 79, temperature 98 F (36.7 C), temperature source Oral, resp. rate 18, SpO2 92 %. General:  Somnolent, unable to respond to any verbal commands, transiently open eyes to sternal rub Eyes: pink conjunctiva,anicteric sclera, pupils equal and reactive to light and accomodation, HEENT: normocephalic, atraumatic, oropharynx clear Neck: supple, no masses or lymphadenopathy, no goiter, no bruits, no JVD CVS: Regular rate and rhythm, without murmurs, rubs or gallops. No lower extremity edema Resp : Clear to auscultation bilaterally, no wheezing, rales or rhonchi. GI : Soft, nontender, nondistended, positive bowel sounds, no masses. No hepatomegaly. No hernia.  Musculoskeletal: No clubbing or cyanosis, positive pedal pulses. No contracture. ROM intact  Neuro: Unable to assess Psych: Somnolent Skin: no rashes or lesions, warm and dry   LABS on Admission: I have personally reviewed all the labs and imagings below    Basic Metabolic Panel: Recent Labs  Lab 11/27/19 0658 11/27/19 0743  NA 146* 146*  K 4.6 4.9  CL 112* 110  CO2  --  27  GLUCOSE 78 79  BUN 30* 32*  CREATININE 0.90 0.90  CALCIUM  --  11.4*  MG  --  1.8  PHOS  --  2.9   Liver Function Tests: Recent Labs  Lab 11/27/19 0743  AST 78*  ALT 114*  ALKPHOS 111  BILITOT 0.4  PROT 7.2  ALBUMIN 3.9   No results for input(s): LIPASE, AMYLASE in the last 168 hours. No results for input(s): AMMONIA in the last 168 hours. CBC: Recent Labs  Lab 11/27/19 0651 11/27/19 0658  WBC 5.1  --   NEUTROABS 3.6  --   HGB 13.8 12.9*  HCT 42.1 38.0*  MCV 94.0  --   PLT 128*  --    Cardiac Enzymes: No results for input(s): CKTOTAL, CKMB, CKMBINDEX, TROPONINI in the last 168 hours. BNP: Invalid input(s): POCBNP CBG: No results for input(s): GLUCAP in the last 168 hours.  Radiological Exams on Admission:  Ct Head Wo Contrast  Result Date: 11/27/2019 CLINICAL DATA:  Increased weakness EXAM: CT HEAD WITHOUT CONTRAST TECHNIQUE: Contiguous axial images were obtained from the base of the skull through the vertex without  intravenous contrast. COMPARISON:  Brain MRI 11/04/2017 FINDINGS: Brain: No acute intracranial hemorrhage. No focal mass lesion. No CT evidence of acute infarction. No midline shift or mass effect. No hydrocephalus. Basilar cisterns are patent. Vascular: No hyperdense vessel or unexpected calcification. Skull: Normal. Negative for fracture or focal lesion. Sinuses/Orbits: Paranasal sinuses and mastoid air cells are clear. Orbits are clear. Other: None. IMPRESSION: No acute intracranial findings. Electronically Signed   By: Genevive BiStewart  Edmunds M.D.   On: 11/27/2019 09:03   Mr Laqueta JeanBrain W And Wo Contrast  Result Date: 11/27/2019 CLINICAL DATA:  68 year old male with multiple sclerosis. Increasing right side weakness  and limb ataxia. EXAM: MRI HEAD WITHOUT AND WITH CONTRAST TECHNIQUE: Multiplanar, multiecho pulse sequences of the brain and surrounding structures were obtained without and with intravenous contrast. CONTRAST:  10mL GADAVIST GADOBUTROL 1 MMOL/ML IV SOLN COMPARISON:  Brain, cervical and thoracic spine MRI 11/05/2017. FINDINGS: Brain: No restricted diffusion to suggest acute infarction. No midline shift, mass effect, evidence of mass lesion, ventriculomegaly, extra-axial collection or acute intracranial hemorrhage. Cervicomedullary junction and pituitary are within normal limits. Stable cerebral volume since 2018. Scattered periventricular and other cerebral white matter T2 and FLAIR hyperintensity including an area of chronic cystic encephalomalacia in the right corona radiata. Possible mild chronic cortical encephalomalacia in the left motor strip series 6, image 21 (right hand representation). Aside from the right corona radiata abnormality tracking into the right deep gray nuclei, the deep gray matter appears spared. There is chronic brainstem and cerebellar volume loss. Comparatively mild chronic corpus callosum volume loss. No new or progressive lesions identified. No abnormal enhancement identified.  No dural thickening. Vascular: Major intracranial vascular flow voids are stable. Major dural venous sinuses are enhancing and appear to be patent. Skull and upper cervical spine: Grossly negative cervical spine. Visualized bone marrow signal is within normal limits. Sinuses/Orbits: Grossly negative orbits. Paranasal sinuses and mastoids are stable and well pneumatized. Other: Visible internal auditory structures appear normal. IMPRESSION: 1. Stable MRI appearance of the brain since 2018 with changes compatible with chronic demyelination. Chronic brainstem volume loss. No acute demyelination or progressive disease identified. No new or progressive intracranial abnormality. 2. No new intracranial abnormality. Electronically Signed   By: Odessa FlemingH  Hall M.D.   On: 11/27/2019 16:35   Dg Chest Portable 1 View  Result Date: 11/27/2019 CLINICAL DATA:  Cough. EXAM: PORTABLE CHEST 1 VIEW COMPARISON:  04/02/2018 FINDINGS: Normal heart size. Decreased lung volumes. No pleural effusion or edema. No airspace densities. IMPRESSION: 1. No acute cardiopulmonary abnormalities. 2. Low lung volumes. Electronically Signed   By: Signa Kellaylor  Stroud M.D.   On: 11/27/2019 08:55      EKG: Independently reviewed.  No new EKG   Assessment/Plan Principal Problem: UTI with acute metabolic encephalopathy: in the setting of self-catheterization, likely neurogenic bladder, present on admission -MRI of the brain negative for acute stroke -Obtain urine culture and blood culture, placed on IV Rocephin  Active Problems: Hypernatremia -Likely due to poor p.o. intake in the last 1 week -Currently n.p.o. due to somnolence, placed on D5 half-normal saline until alert and awake -Recheck bmet in a.m.    Multiple sclerosis (HCC) with spastic gait -Hold baclofen due to acute metabolic encephalopathy -MRI of the brain with no acute demyelination process, stable, changes compatible with chronic demyelination from MS -PT OT evaluation when alert  and awake   acute respiratory failure with hypoxia- new -Currently O2 sats 93% on 3 L, chest x-ray with no pneumonia -Very somnolent due to Ativan x2 received -Follow COVID-19 confirmatory test, point of care is negative -Placed on telemetry, pulse oximetry, will admit as PUI until confirmatory test is back   DVT prophylaxis: Lovenox  CODE STATUS: Full CODE STATUS discussed with patient's wife  Consults called: None  Family Communication: Admission, patients condition and plan of care including tests being ordered have been discussed with the patient's wife who indicates understanding and agree with the plan and Code Status  Admission status: Inpatient telemetry  The medical decision making on this patient was of high complexity and the patient is at high risk for clinical deterioration, therefore this is a  level 3 admission.  Severity of Illness:      The appropriate patient status for this patient is INPATIENT. Inpatient status is judged to be reasonable and necessary in order to provide the required intensity of service to ensure the patient's safety. The patient's presenting symptoms, physical exam findings, and initial radiographic and laboratory data in the context of their chronic comorbidities is felt to place them at high risk for further clinical deterioration. Furthermore, it is not anticipated that the patient will be medically stable for discharge from the hospital within 2 midnights of admission. The following factors support the patient status of inpatient.   " The patient's presenting symptoms include acute respiratory failure with hypoxia, worsening weakness, slurred speech, UTI " The worrisome physical exam findings include hypoxia, somnolent not following commands " The initial radiographic and laboratory data are worrisome because of urine culture and sensitivities " The chronic co-morbidities include MS, self-catheterization, neurogenic bladder   * I certify that at  the point of admission it is my clinical judgment that the patient will require inpatient hospital care spanning beyond 2 midnights from the point of admission due to high intensity of service, high risk for further deterioration and high frequency of surveillance required.*    Time Spent on Admission: 60 minutes    Tayton Decaire M.D. [facility] 11/27/2019, 5:54 PM

## 2019-11-27 NOTE — ED Notes (Signed)
Contacted lab regarding pending covid test. Test was negative.

## 2019-11-27 NOTE — ED Notes (Addendum)
Called lab again to check on status of 6-24 hr covid test. Test is negative and has crossed over in their system but not in epic. Airborne and contact precautions discontinued.

## 2019-11-27 NOTE — Progress Notes (Addendum)
Abg sample obtained while pt on nrb mask. Lab notified that sample sent to them for processing.

## 2019-11-27 NOTE — ED Notes (Addendum)
Critical calcium 1.53 mmol/L. Veatrice Kells, MD, and [facility], RN, notified.

## 2019-11-27 NOTE — ED Notes (Signed)
Patient was unable to complete MRI. RN has informed ED provider.

## 2019-11-27 NOTE — ED Triage Notes (Signed)
Pt coming from home c/o increased weakness that started today per wife. Pt has MS. A&Ox4. Pt has no complaints. The wife sent him here.   150/86 72 hr 96% room air 18 rr

## 2019-11-28 ENCOUNTER — Inpatient Hospital Stay (HOSPITAL_COMMUNITY): Payer: Medicare Other

## 2019-11-28 DIAGNOSIS — G35 Multiple sclerosis: Secondary | ICD-10-CM

## 2019-11-28 DIAGNOSIS — I4891 Unspecified atrial fibrillation: Secondary | ICD-10-CM

## 2019-11-28 DIAGNOSIS — J9601 Acute respiratory failure with hypoxia: Secondary | ICD-10-CM | POA: Diagnosis not present

## 2019-11-28 DIAGNOSIS — G9341 Metabolic encephalopathy: Secondary | ICD-10-CM

## 2019-11-28 DIAGNOSIS — N3000 Acute cystitis without hematuria: Secondary | ICD-10-CM

## 2019-11-28 DIAGNOSIS — J189 Pneumonia, unspecified organism: Secondary | ICD-10-CM

## 2019-11-28 LAB — CBC
HCT: 45.1 % (ref 39.0–52.0)
Hemoglobin: 14.6 g/dL (ref 13.0–17.0)
MCH: 30.5 pg (ref 26.0–34.0)
MCHC: 32.4 g/dL (ref 30.0–36.0)
MCV: 94.2 fL (ref 80.0–100.0)
Platelets: 120 10*3/uL — ABNORMAL LOW (ref 150–400)
RBC: 4.79 MIL/uL (ref 4.22–5.81)
RDW: 15.4 % (ref 11.5–15.5)
WBC: 10.7 10*3/uL — ABNORMAL HIGH (ref 4.0–10.5)
nRBC: 0.2 % (ref 0.0–0.2)

## 2019-11-28 LAB — HEPATIC FUNCTION PANEL
ALT: 89 U/L — ABNORMAL HIGH (ref 0–44)
AST: 57 U/L — ABNORMAL HIGH (ref 15–41)
Albumin: 3.3 g/dL — ABNORMAL LOW (ref 3.5–5.0)
Alkaline Phosphatase: 75 U/L (ref 38–126)
Bilirubin, Direct: 0.3 mg/dL — ABNORMAL HIGH (ref 0.0–0.2)
Indirect Bilirubin: 0.8 mg/dL (ref 0.3–0.9)
Total Bilirubin: 1.1 mg/dL (ref 0.3–1.2)
Total Protein: 6 g/dL — ABNORMAL LOW (ref 6.5–8.1)

## 2019-11-28 LAB — GLUCOSE, CAPILLARY
Glucose-Capillary: 135 mg/dL — ABNORMAL HIGH (ref 70–99)
Glucose-Capillary: 150 mg/dL — ABNORMAL HIGH (ref 70–99)
Glucose-Capillary: 64 mg/dL — ABNORMAL LOW (ref 70–99)
Glucose-Capillary: 69 mg/dL — ABNORMAL LOW (ref 70–99)
Glucose-Capillary: 77 mg/dL (ref 70–99)
Glucose-Capillary: 80 mg/dL (ref 70–99)
Glucose-Capillary: 82 mg/dL (ref 70–99)
Glucose-Capillary: 96 mg/dL (ref 70–99)

## 2019-11-28 LAB — BASIC METABOLIC PANEL
Anion gap: 12 (ref 5–15)
BUN: 35 mg/dL — ABNORMAL HIGH (ref 8–23)
CO2: 23 mmol/L (ref 22–32)
Calcium: 10.2 mg/dL (ref 8.9–10.3)
Chloride: 113 mmol/L — ABNORMAL HIGH (ref 98–111)
Creatinine, Ser: 1.17 mg/dL (ref 0.61–1.24)
GFR calc Af Amer: >60 mL/min (ref >60)
GFR calc non Af Amer: >60 mL/min (ref >60)
Glucose, Bld: 66 mg/dL — ABNORMAL LOW (ref 70–99)
Potassium: 4.7 mmol/L (ref 3.5–5.1)
Sodium: 148 mmol/L — ABNORMAL HIGH (ref 135–145)

## 2019-11-28 LAB — URINE CULTURE: Culture: >=100000 — AB

## 2019-11-28 LAB — HIV ANTIBODY (ROUTINE TESTING W REFLEX): HIV Screen 4th Generation wRfx: NONREACTIVE

## 2019-11-28 LAB — PROCALCITONIN: Procalcitonin: 7.64 ng/mL

## 2019-11-28 LAB — BRAIN NATRIURETIC PEPTIDE: B Natriuretic Peptide: 85.5 pg/mL (ref 0.0–100.0)

## 2019-11-28 LAB — CORTISOL: Cortisol, Plasma: 31.1 ug/dL

## 2019-11-28 LAB — D-DIMER, QUANTITATIVE: D-Dimer, Quant: 0.78 ug/mL-FEU — ABNORMAL HIGH (ref 0.00–0.50)

## 2019-11-28 LAB — LACTATE DEHYDROGENASE: LDH: 133 U/L (ref 98–192)

## 2019-11-28 LAB — FERRITIN: Ferritin: 430 ng/mL — ABNORMAL HIGH (ref 24–336)

## 2019-11-28 LAB — MRSA PCR SCREENING: MRSA by PCR: NEGATIVE

## 2019-11-28 LAB — C-REACTIVE PROTEIN: CRP: 5.8 mg/dL — ABNORMAL HIGH (ref <1.0)

## 2019-11-28 MED ORDER — VANCOMYCIN HCL 10 G IV SOLR
2000.0000 mg | Freq: Once | INTRAVENOUS | Status: DC
  Filled 2019-11-28: qty 2000

## 2019-11-28 MED ORDER — SODIUM CHLORIDE 0.9 % IV SOLN
250.0000 mL | INTRAVENOUS | Status: DC
  Administered 2019-11-28 – 2019-12-01 (×2): 250 mL via INTRAVENOUS

## 2019-11-28 MED ORDER — SODIUM CHLORIDE 0.9 % IV BOLUS
1000.0000 mL | Freq: Once | INTRAVENOUS | Status: AC
  Administered 2019-11-28: 05:00:00 1000 mL via INTRAVENOUS

## 2019-11-28 MED ORDER — SODIUM CHLORIDE 0.9 % IV BOLUS
1000.0000 mL | Freq: Once | INTRAVENOUS | Status: AC
  Administered 2019-11-28: 1000 mL via INTRAVENOUS

## 2019-11-28 MED ORDER — DEXTROSE 50 % IV SOLN
12.5000 g | INTRAVENOUS | Status: AC
  Administered 2019-11-28: 08:00:00 12.5 g via INTRAVENOUS
  Filled 2019-11-28: qty 50

## 2019-11-28 MED ORDER — VANCOMYCIN HCL IN DEXTROSE 750-5 MG/150ML-% IV SOLN: 750.0000 mg | Freq: Two times a day (BID) | INTRAVENOUS | Status: DC

## 2019-11-28 MED ORDER — DEXTROSE 5 % IV SOLN
INTRAVENOUS | Status: DC
  Administered 2019-11-28 – 2019-11-30 (×6): via INTRAVENOUS

## 2019-11-28 MED ORDER — PANTOPRAZOLE SODIUM 40 MG IV SOLR
40.0000 mg | Freq: Every day | INTRAVENOUS | Status: DC
  Administered 2019-11-28 – 2019-12-01 (×4): 40 mg via INTRAVENOUS
  Filled 2019-11-28 (×4): qty 40

## 2019-11-28 MED ORDER — PIPERACILLIN-TAZOBACTAM 3.375 G IVPB
3.3750 g | Freq: Three times a day (TID) | INTRAVENOUS | Status: DC
  Administered 2019-11-28 – 2019-12-02 (×12): 3.375 g via INTRAVENOUS
  Filled 2019-11-28 (×12): qty 50

## 2019-11-28 MED ORDER — DEXTROSE 50 % IV SOLN
12.5000 g | INTRAVENOUS | Status: AC
  Administered 2019-11-28: 05:00:00 12.5 g via INTRAVENOUS

## 2019-11-28 MED ORDER — DEXTROSE 50 % IV SOLN
INTRAVENOUS | Status: AC
  Administered 2019-11-28: 05:00:00 12.5 g via INTRAVENOUS
  Filled 2019-11-28: qty 50

## 2019-11-28 MED ORDER — SODIUM CHLORIDE 0.9 % IV SOLN
2.0000 g | Freq: Three times a day (TID) | INTRAVENOUS | Status: DC
  Administered 2019-11-28: 10:00:00 2 g via INTRAVENOUS
  Filled 2019-11-28: qty 2

## 2019-11-28 MED ORDER — PHENYLEPHRINE HCL-NACL 10-0.9 MG/250ML-% IV SOLN
INTRAVENOUS | Status: AC
  Administered 2019-11-28: 25 ug/min via INTRAVENOUS
  Filled 2019-11-28: qty 250

## 2019-11-28 MED ORDER — PHENYLEPHRINE HCL-NACL 10-0.9 MG/250ML-% IV SOLN
25.0000 ug/min | INTRAVENOUS | Status: DC
  Administered 2019-11-28: 10:00:00 25 ug/min via INTRAVENOUS
  Administered 2019-11-28: 12:00:00 90 ug/min via INTRAVENOUS
  Administered 2019-11-28: 70 ug/min via INTRAVENOUS
  Administered 2019-11-28: 20:00:00 50 ug/min via INTRAVENOUS
  Administered 2019-11-29: 20 ug/min via INTRAVENOUS
  Filled 2019-11-28 (×3): qty 250
  Filled 2019-11-28: qty 500

## 2019-11-28 NOTE — Progress Notes (Signed)
Pharmacy Antibiotic Note  Stephen Wade is a 68 y.o. male admitted on 11/27/2019 with r/o PNA, met sepsis criteria.  Pharmacy initially was consulted for Vancomycin & Cefepime dosing, changed to Zosyn per CCM for aspiration PNA  Plan: Zosyn 3.375gm q8, 4 hr infusion  Afeb (hypothermic x2), WBC 10.7, SCr 1.17 PCT 7.64    Temp (24hrs), Avg:95.8 F (35.4 C), Min:91.4 F (33 C), Max:98 F (36.7 C)  Recent Labs  Lab 11/27/19 0651 11/27/19 0658 11/27/19 0743 11/27/19 1643 11/28/19 0253  WBC 5.1  --   --   --  10.7*  CREATININE  --  0.90 0.90  --  1.17  LATICACIDVEN  --   --   --  0.8  --     CrCl cannot be calculated (Unknown ideal weight.).    No Known Allergies  Antimicrobials this admission: 12/5 CTX x1 for possible UTI 12/6 Cefepime x1 12/6 Zosyn >>   Dose adjustments this admission:  Microbiology results: 12/5 BCx: sent 12/5 UCx: sent  12/6 Sputum: obtained  12/5 MRSA PCR: neg 12/5 Covid: neg  Thank you for allowing pharmacy to be a part of this patient's care.  Minda Ditto PharmD 11/28/2019 9:24 AM

## 2019-11-28 NOTE — Progress Notes (Addendum)
[facility]                                                                              Patient Demographics  Stephen Wade, is a 68 y.o. male, DOB - 07-01-1951, GXQ:[id number]  Admit date - 11/27/2019   Admitting Physician Yailine Ballard Krystal Eaton, MD  Outpatient Primary MD for the patient is Colon Branch, MD  Outpatient specialists:   LOS - 1  days   Medical records reviewed and are as summarized below:    Chief Complaint  Patient presents with   Weakness       Brief summary   Patient is a 68 year old male with history of MS, GERD, recurrent UTIs, self caths was brought via EMS for concern of worsening weakness, confusion in the last 1 week.  At the time of my examination, patient is extremely drowsy, unable to respond to any commands, had received Ativan IV 1 mg x 2 prior to my encounter for the MRI brain.  History was obtained from patient's wife over the phone.  She reported that at baseline he is wheelchair-bound, has right-sided weakness worse than the left due to MS.  She noticed that in the last 1 week he has been getting more confused, very weak, unable to assist with transfers.  She noticed that initially his speech was slow, then noticed to be slurred and drooling.  Patient's wife noticed that the right-sided weakness was more worse than baseline.  No fevers or chills, chest pain or significant dyspnea, nausea or vomiting.  Wife did notice decreased appetite, possible change of taste over the last few days with mild coughing.  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  Both the patient and his wife had tested negative on 11/18.  Patient's wife denied any other exposures. Point-of-care COVID-19 test negative, confirmatory test negative Overnight, 12/5- 12/6; patient was found to have respiratory distress with increasing O2 demands, tachycardia, hypothermia, rapid response was called.  EKG showed atrial fibrillation with RVR with heart rate 120s to  140s.  Patient was started on IV Cardizem.  He was placed from 2 L of O2 via nasal cannula to NRB mask, bear hugger for hypothermia.  ABG showed pH of 7.3, PCO2 42.8, PO2 85.7, O2 sats 95.9 Chest x-ray showed new infiltrate in the right base may represent pneumonia or aspiration     Assessment & Plan    Principal Problem:   Acute respiratory failure with hypoxia ([place]), hypothermia, tachypnea, tachycardia, sepsis -Overnight patient was noted to be hypothermic, in respiratory distress, tachycardia, tachypnea, but was placed on NRB, met sepsis criteria -Temp now improving however BP very low, in 70s to 80s.  Discussed with CCM, Dr. Lamonte Sakai, patient has received IV fluids, multiple IV fluid boluses, will need vasopressors.  I have discussed the code status with his wife yesterday, full CODE STATUS -COVID-19 test negative, BNP 85, CRP 5.8, ferritin 430, D-dimer 0.78.  Obtain CT angiogram of the chest to rule out PE when patient is hemodynamically stable.  Cortisol level normal. -DC IV Rocephin, broaden antibiotic coverage to IV vancomycin and cefepime -Chest x-ray with bilateral  pleural effusions right greater than left, decreased aeration to the lung bases compatible with atelectasis or infiltrate  Active Problems: Acute metabolic encephalopathy likely due to #1, UTI -Follow urine culture and sensitivities, blood cultures, continue IV cefepime    UTI (urinary tract infection), in the setting of self-catheterization, likely neurogenic bladder, present on admission -Continue IV cefepime, follow urine culture and sensitivities  Atrial fibrillation with RVR -Overnight patient was noted to be in A. fib with RVR, was placed on IV Cardizem drip, turned off due to hypotension -At the time of my exam this morning, back in sinus rhythm  Multiple sclerosis with spastic diplegia -Continue to hold baclofen due to acute metabolic encephalopathy -MRI of the brain with no acute demyelination process, stable  changes compatible with chronic demyelination from MS -PT OT evaluation once improving and stable  Pressure injury Stage I with nonblanchable redness with small blistered areas to the mid bilateral buttocks, present on admission Wound care per nursing  Code Status: Full CODE STATUS DVT Prophylaxis:  Lovenox  Family Communication: Discussed overnight issues, labs, imaging with patient's wife on the phone   Disposition Plan: High risk of deterioration, hemodynamically unstable, remains inpatient, was transferred to stepdown unit last night  Time Spent in minutes 45 minutes  Procedures:  None  Consultants:   PCCM, Dr. Lamonte Sakai  Antimicrobials:   Anti-infectives (From admission, onward)   Start     Dose/Rate Route Frequency Ordered Stop   11/28/19 1400  cefTRIAXone (ROCEPHIN) 1 g in sodium chloride 0.9 % 100 mL IVPB     1 g 200 mL/hr over 30 Minutes Intravenous Every 24 hours 11/27/19 2133     11/27/19 1415  cefTRIAXone (ROCEPHIN) 1 g in sodium chloride 0.9 % 100 mL IVPB     1 g 200 mL/hr over 30 Minutes Intravenous  Once 11/27/19 1406 11/27/19 1520         Medications  Scheduled Meds:  chlorhexidine  15 mL Mouth Rinse BID   Chlorhexidine Gluconate Cloth  6 each Topical Q0600   diltiazem  10 mg Intravenous Once   enoxaparin (LOVENOX) injection  40 mg Subcutaneous Q24H   mouth rinse  15 mL Mouth Rinse q12n4p   Continuous Infusions:  cefTRIAXone (ROCEPHIN)  IV     dextrose 5 % and 0.45% NaCl 100 mL/hr at 11/28/19 0814   diltiazem (CARDIZEM) infusion Stopped (11/27/19 2335)   PRN Meds:.acetaminophen **OR** acetaminophen, ondansetron **OR** ondansetron (ZOFRAN) IV      Subjective:   Stephen Wade was seen and examined today.  At the time of my examination much more alert and awake from admission, still has tachycardia but sinus rhythm at 94% on 5 L, was placed on NRB last night..  Morning, difficult to obtain review of system from the patient. Overnight,  acute  events noted and reviewed  Objective:   Vitals:   11/28/19 0600 11/28/19 0630 11/28/19 0700 11/28/19 0800  BP: (S) (!) 77/41 (!) 94/46  (!) 81/37  Pulse: (!) 108 (!) 107 (!) 107 (!) 103  Resp: 17 (!) 21 (!) 21 (!) 21  Temp:      TempSrc:      SpO2: 94% 95% 96% 94%   No intake or output data in the 24 hours ending 11/28/19 0909   Wt Readings from Last 3 Encounters:  05/29/19 95.3 kg  04/28/18 87.6 kg  04/24/18 87.5 kg     Exam  General: Awake and alert however not following commands, moaning  Eyes:  HEENT:  Atraumatic, normocephalic  Cardiovascular: S1 S2 auscultated, no murmurs, RRR  Respiratory: Coarse breath sounds bilaterally  Gastrointestinal: Soft, nontender, nondistended, + bowel sounds  Ext: no pedal edema bilaterally  Neuro: Unable to assess, has baseline right-sided weakness  Musculoskeletal: No digital cyanosis, clubbing  Skin: No rashes  Psych: Alert and awake but not following commands  Data Reviewed:  I have personally reviewed following labs and imaging studies  Micro Results Recent Results (from the past 240 hour(s))  SARS CORONAVIRUS 2 (TAT 6-24 HRS) Nasopharyngeal Nasopharyngeal Swab     Status: None   Collection Time: 11/27/19  7:51 AM   Specimen: Nasopharyngeal Swab  Result Value Ref Range Status   SARS Coronavirus 2 NEGATIVE NEGATIVE Final    Comment: (NOTE) SARS-CoV-2 target nucleic acids are NOT DETECTED. The SARS-CoV-2 RNA is generally detectable in upper and lower respiratory specimens during the acute phase of infection. Negative results do not preclude SARS-CoV-2 infection, do not rule out co-infections with other pathogens, and should not be used as the sole basis for treatment or other patient management decisions. Negative results must be combined with clinical observations, patient history, and epidemiological information. The expected result is Negative. Fact Sheet for  Patients: SugarRoll.be Fact Sheet for Healthcare Providers: https://www.woods-mathews.com/ This test is not yet approved or cleared by the [place] FDA and  has been authorized for detection and/or diagnosis of SARS-CoV-2 by FDA under an Emergency Use Authorization (EUA). This EUA will remain  in effect (meaning this test can be used) for the duration of the COVID-19 declaration under Section 56 4(b)(1) of the Act, 21 U.S.C. section 360bbb-3(b)(1), unless the authorization is terminated or revoked sooner. Performed at [facility], [place] [address], [place], [place] [address]   Culture, blood (routine x 2)     Status: None (Preliminary result)   Collection Time: 11/27/19  6:17 PM   Specimen: BLOOD  Result Value Ref Range Status   Specimen Description   Final    BLOOD RIGHT ANTECUBITAL Performed at [facility] [address] [place], [place] [address]    Special Requests   Final    BOTTLES DRAWN AEROBIC AND ANAEROBIC Blood Culture adequate volume Performed at [facility] [address], [place], [place] [address]    Culture   Final    NO GROWTH < 12 HOURS Performed at [facility] [address], [place], [place] [address]    Report Status PENDING  Incomplete  Culture, blood (routine x 2)     Status: None (Preliminary result)   Collection Time: 11/27/19  6:17 PM   Specimen: BLOOD  Result Value Ref Range Status   Specimen Description   Final    BLOOD RIGHT HAND Performed at [facility] [address], [place], [place] [address]    Special Requests   Final    BOTTLES DRAWN AEROBIC AND ANAEROBIC Blood Culture adequate volume Performed at [facility] [address] [place], [place] [address]    Culture   Final    NO GROWTH < 12 HOURS Performed at [facility] [address], [place], [place] [address]    Report Status PENDING   Incomplete  MRSA PCR Screening     Status: None   Collection Time: 11/27/19 11:25 PM   Specimen: Nasal Mucosa; Nasopharyngeal  Result Value Ref Range Status   MRSA by PCR NEGATIVE NEGATIVE Final    Comment:  The GeneXpert MRSA Assay (FDA approved for NASAL specimens only), is one component of a comprehensive MRSA colonization surveillance program. It is not intended to diagnose MRSA infection nor to guide or monitor treatment for MRSA infections. Performed at [facility], [place] [address], [place], [place] [address]     Radiology Reports Ct Head Wo Contrast  Result Date: 11/27/2019 CLINICAL DATA:  Increased weakness EXAM: CT HEAD WITHOUT CONTRAST TECHNIQUE: Contiguous axial images were obtained from the base of the skull through the vertex without intravenous contrast. COMPARISON:  Brain MRI 11/04/2017 FINDINGS: Brain: No acute intracranial hemorrhage. No focal mass lesion. No CT evidence of acute infarction. No midline shift or mass effect. No hydrocephalus. Basilar cisterns are patent. Vascular: No hyperdense vessel or unexpected calcification. Skull: Normal. Negative for fracture or focal lesion. Sinuses/Orbits: Paranasal sinuses and mastoid air cells are clear. Orbits are clear. Other: None. IMPRESSION: No acute intracranial findings. Electronically Signed   By: Suzy Bouchard M.D.   On: 11/27/2019 09:03   Mr Jeri Cos And Wo Contrast  Result Date: 11/27/2019 CLINICAL DATA:  68 year old male with multiple sclerosis. Increasing right side weakness and limb ataxia. EXAM: MRI HEAD WITHOUT AND WITH CONTRAST TECHNIQUE: Multiplanar, multiecho pulse sequences of the brain and surrounding structures were obtained without and with intravenous contrast. CONTRAST:  40m GADAVIST GADOBUTROL 1 MMOL/ML IV SOLN COMPARISON:  Brain, cervical and thoracic spine MRI 11/05/2017. FINDINGS: Brain: No restricted diffusion to suggest acute infarction. No midline shift, mass effect,  evidence of mass lesion, ventriculomegaly, extra-axial collection or acute intracranial hemorrhage. Cervicomedullary junction and pituitary are within normal limits. Stable cerebral volume since 2018. Scattered periventricular and other cerebral white matter T2 and FLAIR hyperintensity including an area of chronic cystic encephalomalacia in the right corona radiata. Possible mild chronic cortical encephalomalacia in the left motor strip series 6, image 21 (right hand representation). Aside from the right corona radiata abnormality tracking into the right deep gray nuclei, the deep gray matter appears spared. There is chronic brainstem and cerebellar volume loss. Comparatively mild chronic corpus callosum volume loss. No new or progressive lesions identified. No abnormal enhancement identified. No dural thickening. Vascular: Major intracranial vascular flow voids are stable. Major dural venous sinuses are enhancing and appear to be patent. Skull and upper cervical spine: Grossly negative cervical spine. Visualized bone marrow signal is within normal limits. Sinuses/Orbits: Grossly negative orbits. Paranasal sinuses and mastoids are stable and well pneumatized. Other: Visible internal auditory structures appear normal. IMPRESSION: 1. Stable MRI appearance of the brain since 2018 with changes compatible with chronic demyelination. Chronic brainstem volume loss. No acute demyelination or progressive disease identified. No new or progressive intracranial abnormality. 2. No new intracranial abnormality. Electronically Signed   By: HGenevie AnnM.D.   On: 11/27/2019 16:35   Dg Chest Port 1 View  Result Date: 11/28/2019 CLINICAL DATA:  Increased shortness of breath. EXAM: PORTABLE CHEST 1 VIEW COMPARISON:  11/27/2019 FINDINGS: Normal heart size. Decreased lung volumes. Bilateral pleural effusions are identified, right greater than left. There is associated decreased aeration to the lung bases. IMPRESSION: 1. Persistent  bilateral pleural effusions, right greater than left. 2. Decreased aeration to the lung bases compatible with atelectasis and/or infiltrate. Electronically Signed   By: TKerby MoorsM.D.   On: 11/28/2019 08:16   Dg Chest Port 1 View  Result Date: 11/27/2019 CLINICAL DATA:  Increased oxygen demand. EXAM: PORTABLE CHEST 1 VIEW COMPARISON:  November 27, 2019 FINDINGS: There is new infiltrate in the  right base. The heart, hila, mediastinum, lungs, and pleura are otherwise unremarkable. IMPRESSION: New infiltrate in the right base may represent pneumonia or aspiration. Recommend clinical correlation and follow-up to resolution. Electronically Signed   By: Dorise Bullion III M.D   On: 11/27/2019 23:00   Dg Chest Portable 1 View  Result Date: 11/27/2019 CLINICAL DATA:  Cough. EXAM: PORTABLE CHEST 1 VIEW COMPARISON:  04/02/2018 FINDINGS: Normal heart size. Decreased lung volumes. No pleural effusion or edema. No airspace densities. IMPRESSION: 1. No acute cardiopulmonary abnormalities. 2. Low lung volumes. Electronically Signed   By: Kerby Moors M.D.   On: 11/27/2019 08:55    Lab Data:  CBC: Recent Labs  Lab 11/27/19 0651 11/27/19 0658 11/28/19 0253  WBC 5.1  --  10.7*  NEUTROABS 3.6  --   --   HGB 13.8 12.9* 14.6  HCT 42.1 38.0* 45.1  MCV 94.0  --  94.2  PLT 128*  --  953*   Basic Metabolic Panel: Recent Labs  Lab 11/27/19 0658 11/27/19 0743 11/28/19 0253  NA 146* 146* 148*  K 4.6 4.9 4.7  CL 112* 110 113*  CO2  --  27 23  GLUCOSE 78 79 66*  BUN 30* 32* 35*  CREATININE 0.90 0.90 1.17  CALCIUM  --  11.4* 10.2  MG  --  1.8  --   PHOS  --  2.9  --    GFR: CrCl cannot be calculated (Unknown ideal weight.). Liver Function Tests: Recent Labs  Lab 11/27/19 0743 11/28/19 0618  AST 78* 57*  ALT 114* 89*  ALKPHOS 111 75  BILITOT 0.4 1.1  PROT 7.2 6.0*  ALBUMIN 3.9 3.3*   No results for input(s): LIPASE, AMYLASE in the last 168 hours. No results for input(s): AMMONIA in  the last 168 hours. Coagulation Profile: No results for input(s): INR, PROTIME in the last 168 hours. Cardiac Enzymes: No results for input(s): CKTOTAL, CKMB, CKMBINDEX, TROPONINI in the last 168 hours. BNP (last 3 results) No results for input(s): PROBNP in the last 8760 hours. HbA1C: No results for input(s): HGBA1C in the last 72 hours. CBG: Recent Labs  Lab 11/27/19 2222 11/28/19 0006 11/28/19 0438 11/28/19 0533 11/28/19 0740  GLUCAP 101* 77 64* 150* 69*   Lipid Profile: No results for input(s): CHOL, HDL, LDLCALC, TRIG, CHOLHDL, LDLDIRECT in the last 72 hours. Thyroid Function Tests: No results for input(s): TSH, T4TOTAL, FREET4, T3FREE, THYROIDAB in the last 72 hours. Anemia Panel: Recent Labs    11/28/19 0618  FERRITIN 430*   Urine analysis:    Component Value Date/Time   COLORURINE YELLOW 11/27/2019 0949   APPEARANCEUR HAZY (A) 11/27/2019 0949   LABSPEC 1.020 11/27/2019 0949   PHURINE 7.0 11/27/2019 0949   GLUCOSEU NEGATIVE 11/27/2019 0949   GLUCOSEU NEGATIVE 03/11/2013 1106   HGBUR SMALL (A) 11/27/2019 0949   HGBUR large 10/05/2010 1242   BILIRUBINUR NEGATIVE 11/27/2019 0949   BILIRUBINUR neg 03/11/2013 [place] 11/27/2019 0949   PROTEINUR NEGATIVE 11/27/2019 0949   UROBILINOGEN 0.2 03/17/2013 1005   NITRITE POSITIVE (A) 11/27/2019 0949   LEUKOCYTESUR LARGE (A) 11/27/2019 0949     Jarika Robben M.D. [facility] 11/28/2019, 9:09 AM   Call night coverage person covering after 7pm

## 2019-11-28 NOTE — Progress Notes (Signed)
Pt NTS per MD order. RT obtained a small amount of sputum that was white and had blood in it. Pt was able to tolerate procedure well with no desaturations. RT will continue to monitor pt status.

## 2019-11-28 NOTE — Consult Note (Signed)
NAME:  Stephen Wade, MRN:  [id number], DOB:  08-17-1951, LOS: 1 ADMISSION DATE:  11/27/2019, CONSULTATION DATE: 12/6 REFERRING MD: Dr. Isidoro Donning, CHIEF COMPLAINT: Altered mental status, shock  Brief History     History of present illness   68 year old man with multiple sclerosis, GERD, recurrent urinary tract infections, perform self catheterizations.  Evaluated in the ED 12/5 for progressive weakness, dysarthria, confusion for over a week.  Some mild cough reported with no sputum.  In the ED UA consistent with urinary tract infection, antibiotics initiated.  MRI brain 12/5 showed chronic demyelinating process, little change from 2018 without any evidence for an acute flare MS.  He required Ativan in order to tolerate the scan, discontinued to be encephalopathic.  Overnight 12/5-6 noted to be tachycardic in atrial fibrillation, progressively hypothermic, altered.  Poor airway protection noted.  He moved urgently to the SDU.  He converted to NSR with IV fluid boluses but remains hypotensive.  Also quite dysarthric with poor airway protection and increasing oxygen demands.  Repeat chest x-ray 12/6 reveals an evolving right lower lobe infiltrate consistent with an aspiration pneumonia.  Past Medical History   has a past medical history of GERD (gastroesophageal reflux disease), Lower urinary tract symptoms (LUTS), MS (multiple sclerosis) (HCC) (2005), Testicular hypogonadism, Urge incontinence of urine, and Urolithiasis (2011).   Significant Hospital Events   Admitted with weakness, severe sepsis from UTI 12/5 Progressive weakness, obtundation, hypothermia, A. fib plus RVR/shock 12/6  Consults:    Procedures:    Significant Diagnostic Tests:  Head CT 12/5 >> no acute findings  MRI brain 12/5 >> no acute infarct or mass lesion, chronic demyelination changes, stable compared with 2018, cerebral volume stable as well, no new abnormality  Micro Data:  SARS CoV2 12/5 >> negative MRSA screen 12/5  >> negative Urine 12/5 >>  Blood 12/5 >>    Antimicrobials:  Ceftriaxone 12/5 >> 12/6 Zosyn 12/6 >>   Interim history/subjective:    Objective   Blood pressure (!) 82/43, pulse 95, temperature 97.8 F (36.6 C), temperature source Axillary, resp. rate 19, weight 96.3 kg, SpO2 95 %.       No intake or output data in the 24 hours ending 11/28/19 1022 Filed Weights   11/28/19 0900  Weight: 96.3 kg    Examination: General: Ill-appearing man, weak, HENT: Oropharynx dry, tongue dry, some pooling of upper airway secretions Lungs: Coarse bilaterally, decreased at both bases, no crackles or wheezes Cardiovascular: Regular, tachycardic, no murmur Abdomen: Soft, nondistended with positive bowel sounds Extremities: Trace lower extremity edema Neuro: Global weakness, does not move lower extremities, opens his eyes to voice, He is able to track and keep eyes open, dysarthric, unable to understand.  Did not stick out his tongue.  Weak cough.    Resolved Hospital Problem list     Assessment & Plan:  Septic shock, initially likely due to urinary tract infection.  Now suspect superimposed right lower lobe aspiration pneumonia -IV fluid resuscitation as has been ongoing -Support broadening ceftriaxone to Zosyn given concern for aspiration, anaerobic process -Initiate phenylephrine via PIV given atrial fibrillation overnight. -Goals of care and CODE STATUS discussion with patient's wife Verlon Au.  Please see below  Acute respiratory failure due to inadequate air protection and also evolving lower lobe pneumonia -NTS x1, consider repeat if helpful and well-tolerated -Not a BiPAP candidate due to poor airway protection  Acute on chronic weakness in the setting of MS, chronic debilitation.  Currently most impactful issue is respiratory  muscle weakness and poor airway protection -Agree with holding baclofen for now -On ocrelizumab as an outpatient (every 6 months) -May need NG tube for core  track placement to allow nutrition, meds, free water  Atrial fibrillation with RVR, now resolved. -Discontinue diltiazem orders.  It was never started.  Hypernatremia -Change IV fluids to D5W, follow sodium  Mild acute renal insufficiency -Follow renal function and urine output with volume resuscitation, restoration of perfusion pressures  Transaminitis, suspect shock liver. -Follow LFTs for resolution -Consider right upper quadrant ultrasound if persistent or worsening    Best practice:  Diet: N.p.o. Pain/Anxiety/Delirium protocol (if indicated): N/A, minimize sedating medications VAP protocol (if indicated): N/A DVT prophylaxis: Enoxaparin GI prophylaxis: Protonix Glucose control: N/A Mobility: Bedrest Code Status: DNR Family Communication: Discussed the patient's status, his medical history, current interventions with the patient's wife Magda Paganini by phone.  Explained his shock in the setting of UTI and now aspiration pneumonia.  Also explained his poor airway protection.  She describes some slow progressive more subacute worsening in his overall functional capacity, extending back farther than just 1 week.  She notes that the patient would not want life support, life extending measures if he were to decline acutely.  Specifically he would not want intubation or mechanical ventilation, would not want CPR.  She is okay with pressors via a peripheral IV, would like to defer central line placement.  I agree with this philosophy.  If he acutely declines then would transition him over to comfort care.  I have placed DNR orders on the chart, will defer central line placement and will use low-dose phenylephrine for pressor support. Disposition: SDU   Labs   CBC: Recent Labs  Lab 11/27/19 0651 11/27/19 0658 11/28/19 0253  WBC 5.1  --  10.7*  NEUTROABS 3.6  --   --   HGB 13.8 12.9* 14.6  HCT 42.1 38.0* 45.1  MCV 94.0  --  94.2  PLT 128*  --  120*    Basic Metabolic Panel: Recent  Labs  Lab 11/27/19 0658 11/27/19 0743 11/28/19 0253  NA 146* 146* 148*  K 4.6 4.9 4.7  CL 112* 110 113*  CO2  --  27 23  GLUCOSE 78 79 66*  BUN 30* 32* 35*  CREATININE 0.90 0.90 1.17  CALCIUM  --  11.4* 10.2  MG  --  1.8  --   PHOS  --  2.9  --    GFR: CrCl cannot be calculated (Unknown ideal weight.). Recent Labs  Lab 11/27/19 0651 11/27/19 1643 11/28/19 0253 11/28/19 0618  PROCALCITON  --   --   --  7.64  WBC 5.1  --  10.7*  --   LATICACIDVEN  --  0.8  --   --     Liver Function Tests: Recent Labs  Lab 11/27/19 0743 11/28/19 0618  AST 78* 57*  ALT 114* 89*  ALKPHOS 111 75  BILITOT 0.4 1.1  PROT 7.2 6.0*  ALBUMIN 3.9 3.3*   No results for input(s): LIPASE, AMYLASE in the last 168 hours. No results for input(s): AMMONIA in the last 168 hours.  ABG    Component Value Date/Time   PHART 7.387 11/27/2019 2235   PCO2ART 42.8 11/27/2019 2235   PO2ART 85.7 11/27/2019 2235   HCO3 25.2 11/27/2019 2235   TCO2 29 11/27/2019 0658   O2SAT 95.9 11/27/2019 2235     Coagulation Profile: No results for input(s): INR, PROTIME in the last 168 hours.  Cardiac Enzymes: No  results for input(s): CKTOTAL, CKMB, CKMBINDEX, TROPONINI in the last 168 hours.  HbA1C: Hgb A1c MFr Bld  Date/Time Value Ref Range Status  05/12/2014 12:24 PM 5.2 4.6 - 6.5 % Final    Comment:    Glycemic Control Guidelines for People with Diabetes:Non Diabetic:  <6%Goal of Therapy: <7%Additional Action Suggested:  >8%     CBG: Recent Labs  Lab 11/27/19 2222 11/28/19 0006 11/28/19 0438 11/28/19 0533 11/28/19 0740  GLUCAP 101* 77 64* 150* 69*    Review of Systems:   Unable to obtain from patient  Past Medical History  He,  has a past medical history of GERD (gastroesophageal reflux disease), Lower urinary tract symptoms (LUTS), MS (multiple sclerosis) (HCC) (2005), Testicular hypogonadism, Urge incontinence of urine, and Urolithiasis (2011).   Surgical History    Past Surgical  History:  Procedure Laterality Date  . APPENDECTOMY      ruptured 03-2010  . VASECTOMY       Social History   reports that he has quit smoking. He has never used smokeless tobacco. He reports that he does not drink alcohol or use drugs.   Family History   His family history includes Brain cancer in his father; Heart attack in his mother. There is no history of Colon cancer or Prostate cancer.   Allergies No Known Allergies   Home Medications  Prior to Admission medications   Medication Sig Start Date End Date Taking? Authorizing Provider  baclofen (LIORESAL) 10 MG tablet Take 1 tablet (10 mg total) by mouth 3 (three) times daily. 03/04/19  Yes Sater, Pearletha Furl, MD  omeprazole (PRILOSEC) 20 MG capsule Take 20 mg daily as needed by mouth (for acid reflex).    Yes [provider]  ocrelizumab (OCREVUS) 300 MG/10ML injection Inject 300 mg every 6 (six) months into the vein.    [provider]     Critical care time: 66     Levy Pupa, MD, PhD 11/28/2019, 10:54 AM [facility] [phone] or if no answer [phone]

## 2019-11-28 NOTE — Progress Notes (Signed)
2130 Pt arrived on unit with oxygen saturation in mid 80s, rectal temp of 91.4, and difficult to arouse.  Rapid response notified and warm blankets and heat packs were applied to patient to attempt to bring up core temp.   Pt temp increased to 91.9 rectal prior to transfer to ICU/stepdown.  2300 Report given to [facility] in ICU/Stepdown.

## 2019-11-28 NOTE — Progress Notes (Signed)
Rapid Response Event Note  Overview: Called because of pt mental status, and hypothermia.      Initial Focused Assessment:  Pt has multiple blankets, able to follow simple commands.  P has a hx of MS and is bed bound.  Pt moans out and c/o pain in his legs.  Pt sounds wet, and skin is somewhat mottled.  Pt does feel cool to the touch.     Interventions: Pt placed on monitor, revealed A-fib w/ RVR.  Bp very unstable see chart for details.  ABG's ordered per protocol.  Pt placed on as much as a NRB.  With stimulation pt became more alert and able to answer some questions.  Pt bedside RN remained with pt and [facility] MD  paged to evaluate pt at the bedside.  EKG and PCXR complete. CBG 101. VS see chart for details.  Plan of Care (if not transferred):  Pt will be transferred to SDU room 1228 for further and closer evaluation as well as interventions see orders per MD.    Dyann Ruddle

## 2019-11-28 NOTE — Progress Notes (Signed)
CRITICAL VALUE ALERT  Critical Value:  CBG at 64  Date & Time Notied:  11/28/2019 0500  Provider Notified: [facility] paged   Orders Received/Actions taken: Dextrose 12.5g given; Rechecked CBG at 0515 Glucose now at 150

## 2019-11-29 ENCOUNTER — Encounter (HOSPITAL_COMMUNITY): Payer: Self-pay | Admitting: Radiology

## 2019-11-29 ENCOUNTER — Inpatient Hospital Stay (HOSPITAL_COMMUNITY): Payer: Medicare Other

## 2019-11-29 DIAGNOSIS — N3000 Acute cystitis without hematuria: Secondary | ICD-10-CM | POA: Diagnosis not present

## 2019-11-29 DIAGNOSIS — J189 Pneumonia, unspecified organism: Secondary | ICD-10-CM | POA: Diagnosis not present

## 2019-11-29 LAB — GLUCOSE, CAPILLARY
Glucose-Capillary: 81 mg/dL (ref 70–99)
Glucose-Capillary: 84 mg/dL (ref 70–99)
Glucose-Capillary: 76 mg/dL (ref 70–99)
Glucose-Capillary: 84 mg/dL (ref 70–99)
Glucose-Capillary: 76 mg/dL (ref 70–99)
Glucose-Capillary: 92 mg/dL (ref 70–99)

## 2019-11-29 LAB — BASIC METABOLIC PANEL
Anion gap: 7 (ref 5–15)
BUN: 27 mg/dL — ABNORMAL HIGH (ref 8–23)
CO2: 22 mmol/L (ref 22–32)
Calcium: 9.2 mg/dL (ref 8.9–10.3)
Chloride: 117 mmol/L — ABNORMAL HIGH (ref 98–111)
Creatinine, Ser: 1.24 mg/dL (ref 0.61–1.24)
GFR calc Af Amer: >60 mL/min (ref >60)
GFR calc non Af Amer: >60 mL/min (ref >60)
Glucose, Bld: 87 mg/dL (ref 70–99)
Potassium: 4.1 mmol/L (ref 3.5–5.1)
Sodium: 146 mmol/L — ABNORMAL HIGH (ref 135–145)

## 2019-11-29 LAB — HEPATIC FUNCTION PANEL
ALT: 72 U/L — ABNORMAL HIGH (ref 0–44)
AST: 47 U/L — ABNORMAL HIGH (ref 15–41)
Albumin: 2.8 g/dL — ABNORMAL LOW (ref 3.5–5.0)
Alkaline Phosphatase: 78 U/L (ref 38–126)
Bilirubin, Direct: 0.2 mg/dL (ref 0.0–0.2)
Indirect Bilirubin: 0.7 mg/dL (ref 0.3–0.9)
Total Bilirubin: 0.9 mg/dL (ref 0.3–1.2)
Total Protein: 5.7 g/dL — ABNORMAL LOW (ref 6.5–8.1)

## 2019-11-29 LAB — CBC
HCT: 35.3 % — ABNORMAL LOW (ref 39.0–52.0)
Hemoglobin: 11.4 g/dL — ABNORMAL LOW (ref 13.0–17.0)
MCH: 31.3 pg (ref 26.0–34.0)
MCHC: 32.3 g/dL (ref 30.0–36.0)
MCV: 97 fL (ref 80.0–100.0)
Platelets: 128 10*3/uL — ABNORMAL LOW (ref 150–400)
RBC: 3.64 MIL/uL — ABNORMAL LOW (ref 4.22–5.81)
RDW: 16.1 % — ABNORMAL HIGH (ref 11.5–15.5)
WBC: 13.1 10*3/uL — ABNORMAL HIGH (ref 4.0–10.5)
nRBC: 0 % (ref 0.0–0.2)

## 2019-11-29 MED ORDER — SODIUM CHLORIDE (PF) 0.9 % IJ SOLN
INTRAMUSCULAR | Status: AC
  Filled 2019-11-29: qty 50

## 2019-11-29 MED ORDER — IOHEXOL 350 MG/ML SOLN
100.0000 mL | Freq: Once | INTRAVENOUS | Status: AC | PRN
  Administered 2019-11-29: 15:00:00 100 mL via INTRAVENOUS

## 2019-11-29 NOTE — Evaluation (Addendum)
Clinical/Bedside Swallow Evaluation Patient Details  Name: Stephen Wade MRN: [id number] Date of Birth: 1951-09-28  Today's Date: 11/29/2019 Time: SLP Start Time (ACUTE ONLY): 1109 SLP Stop Time (ACUTE ONLY): 1140 SLP Time Calculation (min) (ACUTE ONLY): 31 min  Past Medical History:  Past Medical History:  Diagnosis Date  . GERD (gastroesophageal reflux disease)    had on- off chest pain, symptoms resolved with Prilosec 11/2010  . Lower urinary tract symptoms (LUTS)    Dr Gaynelle Arabian  . MS (multiple sclerosis) ([place]) 2005  . Testicular hypogonadism   . Urge incontinence of urine   . Urolithiasis 2011   Past Surgical History:  Past Surgical History:  Procedure Laterality Date  . APPENDECTOMY      ruptured 03-2010  . VASECTOMY     HPI:  68 yo male adm tto [facility] with sepsis - recurrent UTIs for which he self caths with AMS = Pt w/ MS, GERD,  = after receiving Ativan he demonstrated decreased mentation - 12/5-12/6 pt became tachycardia ? Afib, hypotensive  and was transferred to SDU.  Is now on oxygen and in ICU.  CXR showed possible progressive RLL pna.  Pt MRI showed demyelinating process which is not significantly worse compared to 2018 MRI.  He demonstrates poor airway protection of secretions today with gross dysarthria.  Pt reports he has h/o poor intake recently with increased difficulties swallowing compared to management of secretions.  Also admits to taste changes.  Swallow eval ordered.   Assessment / Plan / Recommendation Clinical Impression  Currently Mr Czerwinski is grossly weak and is not managing swallowing secretions.  His reflexive cough is weak and does not allow laryngeal clearance of secretions.  SLP provided pt with oral care and MINIMAL intake of tsp nectar juice and 1/4 tsp of applesauce.  Anterior loss of secretions mixed with liquid boluses observed along with excessively delayed swallow followed by immediate cough post-swallow of nectar.  Pt report this coughing post-  swallow is NEW and he has had more problems swallowing lately but his goal is to get better regarding swallow.  Recommend NPO with adequate oral care.  SLP will follow up next date to establish his baseline (RN to talk to wife today) and to determine readiness for MBS vs po intake for comfort.  SLP educated pt to importance of increasing strength of cough/voice/swallowing of secretions using teach back. Pt denies prior swallow evaluations.  MBS not indicated today as would likely only diagnosis gross dysphagia and pt reports his baseline is better than current ability.   SLP Visit Diagnosis: Dysphagia, oropharyngeal phase (R13.12)    Aspiration Risk  Risk for inadequate nutrition/hydration;Severe aspiration risk    Diet Recommendation NPO   Medication Administration: Via alternative means    Other  Recommendations Oral Care Recommendations: Oral care QID(instruct pt to swallow his secretions)   Follow up Recommendations    TBD    Frequency and Duration min 2x/week  2 weeks       Prognosis Prognosis for Safe Diet Advancement: Guarded Barriers to Reach Goals: Severity of deficits      Swallow Study   General Date of Onset: 11/29/19 HPI: 68 yo male adm tto [facility] with sepsis - recurrent UTIs for which he self caths with AMS = Pt w/ MS, GERD,  = after receiving Ativan he demonstrated decreased mentation - 12/5-12/6 pt became tachycardia ? Afib, hypotensive  and was transferred to SDU.  Is now on oxygen and in ICU.  CXR showed possible  progressive RLL pna.  Pt MRI showed demyelinating process which is not significantly worse compared to 2018 MRI.  He demonstrates poor airway protection of secretions today with gross dysarthria.  Pt reports he has h/o poor intake recently with increased difficulties swallowing compared to management of secretions.  Also admits to taste changes.  Swallow eval ordered. Type of Study: Bedside Swallow Evaluation Previous Swallow Assessment: none in EPIC Diet Prior  to this Study: Other (Comment);IV Temperature Spikes Noted: No(low temperature) Respiratory Status: Nasal cannula History of Recent Intubation: No Behavior/Cognition: Alert;Cooperative;Pleasant mood Oral Cavity Assessment: Excessive secretions Oral Care Completed by SLP: Yes Oral Cavity - Dentition: Adequate natural dentition Self-Feeding Abilities: Total assist Patient Positioning: Upright in bed(pt with difficulty holding up his head) Baseline Vocal Quality: Wet;Low vocal intensity;Suspected CN X (Vagus) involvement Volitional Cough: Weak;Congested(reflexive cough stronger than volitional but not protective to clear secretions heard in vocal quality) Volitional Swallow: Able to elicit(with significant effort and delay)    Oral/Motor/Sensory Function Overall Oral Motor/Sensory Function: Generalized oral weakness(pt is grossly weak, drooling)   Ice Chips Ice chips: Not tested Other Comments: due to aspiration risk and gross dysarthria   Thin Liquid Thin Liquid: Not tested    Nectar Thick Nectar Thick Liquid: Impaired Presentation: Spoon Oral Phase Impairments: Reduced lingual movement/coordination;Poor awareness of bolus;Reduced labial seal Oral phase functional implications: Prolonged oral transit;Oral holding;Right anterior spillage;Left anterior spillage;Oral residue Pharyngeal Phase Impairments: Suspected delayed Swallow;Cough - Immediate Other Comments: immediate cough post-swallow, which pt reports is not baseline - stating this is aberrant for him   Honey Thick Honey Thick Liquid: Not tested   Puree Puree: Impaired Presentation: Spoon Oral Phase Impairments: Reduced labial seal;Reduced lingual movement/coordination Oral Phase Functional Implications: Prolonged oral transit Pharyngeal Phase Impairments: Suspected delayed Swallow   Solid     Solid: Not tested      Chales Abrahams 11/29/2019,12:02 PM  Donavan Burnet, MS [facility] SLP Acute Rehab Services Pager  [phone] Office [phone]

## 2019-11-29 NOTE — Progress Notes (Signed)
[facility]                                                                              Patient Demographics  Stephen Wade, is a 68 y.o. male, DOB - 11/20/1951, VOP:[id number]  Admit date - 11/27/2019   Admitting Physician Jacqualyn Sedgwick Krystal Eaton, MD  Outpatient Primary MD for the patient is Colon Branch, MD  Outpatient specialists:   LOS - 2  days   Medical records reviewed and are as summarized below:    Chief Complaint  Patient presents with   Weakness       Brief summary   Patient is a 68 year old male with history of MS, GERD, recurrent UTIs, self caths was brought via EMS for concern of worsening weakness, confusion in the last 1 week.  At the time of my examination, patient is extremely drowsy, unable to respond to any commands, had received Ativan IV 1 mg x 2 prior to my encounter for the MRI brain.  History was obtained from patient's wife over the phone.  She reported that at baseline he is wheelchair-bound, has right-sided weakness worse than the left due to MS.  She noticed that in the last 1 week he has been getting more confused, very weak, unable to assist with transfers.  She noticed that initially his speech was slow, then noticed to be slurred and drooling.  Patient's wife noticed that the right-sided weakness was more worse than baseline.  No fevers or chills, chest pain or significant dyspnea, nausea or vomiting.  Wife did notice decreased appetite, possible change of taste over the last few days with mild coughing.  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  Both the patient and his wife had tested negative on 11/18.  Patient's wife denied any other exposures. Point-of-care COVID-19 test negative, confirmatory test negative Overnight, 12/5- 12/6; patient was found to have respiratory distress with increasing O2 demands, tachycardia, hypothermia, rapid response was called.  EKG showed atrial fibrillation with RVR with heart rate 120s to  140s.  Patient was started on IV Cardizem.  He was placed from 2 L of O2 via nasal cannula to NRB mask, bear hugger for hypothermia.  ABG showed pH of 7.3, PCO2 42.8, PO2 85.7, O2 sats 95.9 Chest x-ray showed new infiltrate in the right base may represent pneumonia or aspiration     Assessment & Plan    Principal Problem:   Acute respiratory failure with hypoxia ([place]), hypothermia, tachypnea, tachycardia, sepsis -Overnight on 12/5 -12/6, patient was noted to be hypothermic, in respiratory distress, tachycardia, tachypnea, but was placed on NRB, met sepsis criteria.  Patient was placed on vasopressors due to hypotension with BP in 70s to 80s.  CCM (Dr. Lamonte Sakai) was consulted, patient DNR/DNI after discussion with patient's wife.. -COVID-19 test negative, BNP 85, CRP 5.8, ferritin 430, D-dimer 0.78.  Cortisol level normal. -IV Rocephin was discontinued, patient was placed on IV vancomycin and Zosyn.  MRSA negative, narrowed to IV Zosyn.  -Patient much more alert and awake today, will obtain SLP evaluation  -Vasopressors off, currently holding BP.  -Obtain CT angiogram to rule  out PE  Active Problems: Acute metabolic encephalopathy likely due to #1, UTI -urine culture >100K Group B strept     UTI (urinary tract infection), in the setting of self-catheterization, likely neurogenic bladder, present on admission -currently on IV zosyn  Atrial fibrillation with RVR - patient was noted to be in A. fib with RVR on the night 12/5-12/6, was placed on IV Cardizem drip, turned off due to hypotension, now back on sinus rhythm   Multiple sclerosis with spastic diplegia -Continue to hold baclofen due to acute metabolic encephalopathy -MRI of the brain with no acute demyelination process, stable changes compatible with chronic demyelination from MS -PT OT evaluation once improving and stable  Pressure injury Stage I with nonblanchable redness with small blistered areas to the mid bilateral buttocks,  present on admission Wound care per nursing  Code Status: Full CODE STATUS DVT Prophylaxis:  Lovenox  Family Communication: Discussed issues, labs, imaging with patient's wife on the phone   Disposition Plan: High risk of deterioration, remains inpatient, SDU  Time Spent in minutes 35 mins  Procedures:  None  Consultants:   PCCM, Dr. Lamonte Sakai  Antimicrobials:   Anti-infectives (From admission, onward)   Start     Dose/Rate Route Frequency Ordered Stop   11/28/19 2300  vancomycin (VANCOCIN) IVPB 750 mg/150 ml premix  Status:  Discontinued     750 mg 150 mL/hr over 60 Minutes Intravenous Every 12 hours 11/28/19 0954 11/28/19 1041   11/28/19 1800  piperacillin-tazobactam (ZOSYN) IVPB 3.375 g     3.375 g 12.5 mL/hr over 240 Minutes Intravenous Every 8 hours 11/28/19 1122     11/28/19 1400  cefTRIAXone (ROCEPHIN) 1 g in sodium chloride 0.9 % 100 mL IVPB  Status:  Discontinued     1 g 200 mL/hr over 30 Minutes Intravenous Every 24 hours 11/27/19 2133 11/28/19 0914   11/28/19 1100  vancomycin (VANCOCIN) 2,000 mg in sodium chloride 0.9 % 500 mL IVPB  Status:  Discontinued     2,000 mg 250 mL/hr over 120 Minutes Intravenous  Once 11/28/19 0947 11/28/19 1041   11/28/19 1000  ceFEPIme (MAXIPIME) 2 g in sodium chloride 0.9 % 100 mL IVPB  Status:  Discontinued     2 g 200 mL/hr over 30 Minutes Intravenous Every 8 hours 11/28/19 0946 11/28/19 1041   11/27/19 1415  cefTRIAXone (ROCEPHIN) 1 g in sodium chloride 0.9 % 100 mL IVPB     1 g 200 mL/hr over 30 Minutes Intravenous  Once 11/27/19 1406 11/27/19 1520         Medications  Scheduled Meds:  chlorhexidine  15 mL Mouth Rinse BID   Chlorhexidine Gluconate Cloth  6 each Topical Q0600   enoxaparin (LOVENOX) injection  40 mg Subcutaneous Q24H   mouth rinse  15 mL Mouth Rinse q12n4p   pantoprazole (PROTONIX) IV  40 mg Intravenous QHS   Continuous Infusions:  sodium chloride 10 mL/hr at 11/29/19 0831   dextrose 100 mL/hr at  11/29/19 0831   phenylephrine (NEO-SYNEPHRINE) Adult infusion Stopped (11/29/19 0301)   piperacillin-tazobactam (ZOSYN)  IV 3.375 g (11/29/19 0943)   PRN Meds:.acetaminophen **OR** acetaminophen, ondansetron **OR** ondansetron (ZOFRAN) IV      Subjective:   Jacory Kamel was seen and examined today.  Much more alert and awake today.  Garbled speech but following commands, talking appropriately.  Off vasopressors, since 3 AM.  BP in low 100s.  No nausea vomiting, chest pain.  O2 sats 96% on 5 L  Objective:  Vitals:   11/29/19 0755 11/29/19 0800 11/29/19 0900 11/29/19 1000  BP:  (!) 99/58 103/61 113/63  Pulse:  80 82 86  Resp:  18 17 19   Temp: (!) 95.7 F (35.4 C) (!) 95.2 F (35.1 C)    TempSrc: Axillary Rectal    SpO2:  96% 97% 97%  Weight:        Intake/Output Summary (Last 24 hours) at 11/29/2019 1115 Last data filed at 11/29/2019 1000 Gross per 24 hour  Intake 3615.49 ml  Output 2300 ml  Net 1315.49 ml     Wt Readings from Last 3 Encounters:  11/29/19 97.8 kg  05/29/19 95.3 kg  04/28/18 87.6 kg   Physical Exam  General: Alert and awake, following commands, garbled speech  Eyes:   HEENT:  Atraumatic, normocephalic  Cardiovascular: S1 S2 clear, no murmurs, RRR. No pedal edema b/l  Respiratory: Coarse breath sounds bilaterally  Gastrointestinal: Soft, nontender, nondistended, NBS  Ext: no pedal edema bilaterally  Neuro: Able to lift up both upper extremities, has weakness in his lower extremities  Musculoskeletal: No cyanosis, clubbing  Skin: No rashes  Psych: Getting close to his baseline, following commands, alert and awake   Data Reviewed:  I have personally reviewed following labs and imaging studies  Micro Results Recent Results (from the past 240 hour(s))  SARS CORONAVIRUS 2 (TAT 6-24 HRS) Nasopharyngeal Nasopharyngeal Swab     Status: None   Collection Time: 11/27/19  7:51 AM   Specimen: Nasopharyngeal Swab  Result Value Ref Range  Status   SARS Coronavirus 2 NEGATIVE NEGATIVE Final    Comment: (NOTE) SARS-CoV-2 target nucleic acids are NOT DETECTED. The SARS-CoV-2 RNA is generally detectable in upper and lower respiratory specimens during the acute phase of infection. Negative results do not preclude SARS-CoV-2 infection, do not rule out co-infections with other pathogens, and should not be used as the sole basis for treatment or other patient management decisions. Negative results must be combined with clinical observations, patient history, and epidemiological information. The expected result is Negative. Fact Sheet for Patients: SugarRoll.be Fact Sheet for Healthcare Providers: https://www.woods-mathews.com/ This test is not yet approved or cleared by the [place] FDA and  has been authorized for detection and/or diagnosis of SARS-CoV-2 by FDA under an Emergency Use Authorization (EUA). This EUA will remain  in effect (meaning this test can be used) for the duration of the COVID-19 declaration under Section 56 4(b)(1) of the Act, 21 U.S.C. section 360bbb-3(b)(1), unless the authorization is terminated or revoked sooner. Performed at [facility], [place] [address] [place], [place] [address]   Urine culture     Status: Abnormal   Collection Time: 11/27/19  9:49 AM   Specimen: Urine, Clean Catch  Result Value Ref Range Status   Specimen Description   Final    URINE, CLEAN CATCH Performed at [facility], [place] [address] [place], [place] [address]    Special Requests   Final    NONE Performed at [facility], [place] [address] [place], [place] [address]    Culture (A)  Final    >=100,000 COLONIES/mL GROUP B STREP(S.AGALACTIAE)ISOLATED TESTING AGAINST S. AGALACTIAE NOT ROUTINELY PERFORMED DUE TO PREDICTABILITY OF AMP/PEN/VAN SUSCEPTIBILITY. Performed at [facility], [place] [address] [place], [place] [address]      Report Status 11/28/2019 FINAL  Final  Culture, blood (routine x 2)     Status: None (Preliminary result)   Collection Time: 11/27/19  6:17  PM   Specimen: BLOOD  Result Value Ref Range Status   Specimen Description   Final    BLOOD RIGHT ANTECUBITAL Performed at [facility] [address] [place], [place] [address]    Special Requests   Final    BOTTLES DRAWN AEROBIC AND ANAEROBIC Blood Culture adequate volume Performed at [facility] [address], [place], [place] [address]    Culture   Final    NO GROWTH < 12 HOURS Performed at [facility] [address], [place], [place] [address]    Report Status PENDING  Incomplete  Culture, blood (routine x 2)     Status: None (Preliminary result)   Collection Time: 11/27/19  6:17 PM   Specimen: BLOOD  Result Value Ref Range Status   Specimen Description   Final    BLOOD RIGHT HAND Performed at [facility] [address], [place], [place] [address]    Special Requests   Final    BOTTLES DRAWN AEROBIC AND ANAEROBIC Blood Culture adequate volume Performed at [facility] [address], [place], [place] [address]    Culture   Final    NO GROWTH < 12 HOURS Performed at [facility] [address] [place], [place] [address]    Report Status PENDING  Incomplete  MRSA PCR Screening     Status: None   Collection Time: 11/27/19 11:25 PM   Specimen: Nasal Mucosa; Nasopharyngeal  Result Value Ref Range Status   MRSA by PCR NEGATIVE NEGATIVE Final    Comment:        The GeneXpert MRSA Assay (FDA approved for NASAL specimens only), is one component of a comprehensive MRSA colonization surveillance program. It is not intended to diagnose MRSA infection nor to guide or monitor treatment for MRSA infections. Performed at [facility], [place] [address], [place], [place] [address]     Radiology Reports Ct Head Wo  Contrast  Result Date: 11/27/2019 CLINICAL DATA:  Increased weakness EXAM: CT HEAD WITHOUT CONTRAST TECHNIQUE: Contiguous axial images were obtained from the base of the skull through the vertex without intravenous contrast. COMPARISON:  Brain MRI 11/04/2017 FINDINGS: Brain: No acute intracranial hemorrhage. No focal mass lesion. No CT evidence of acute infarction. No midline shift or mass effect. No hydrocephalus. Basilar cisterns are patent. Vascular: No hyperdense vessel or unexpected calcification. Skull: Normal. Negative for fracture or focal lesion. Sinuses/Orbits: Paranasal sinuses and mastoid air cells are clear. Orbits are clear. Other: None. IMPRESSION: No acute intracranial findings. Electronically Signed   By: Suzy Bouchard M.D.   On: 11/27/2019 09:03   Mr Jeri Cos And Wo Contrast  Result Date: 11/27/2019 CLINICAL DATA:  68 year old male with multiple sclerosis. Increasing right side weakness and limb ataxia. EXAM: MRI HEAD WITHOUT AND WITH CONTRAST TECHNIQUE: Multiplanar, multiecho pulse sequences of the brain and surrounding structures were obtained without and with intravenous contrast. CONTRAST:  30m GADAVIST GADOBUTROL 1 MMOL/ML IV SOLN COMPARISON:  Brain, cervical and thoracic spine MRI 11/05/2017. FINDINGS: Brain: No restricted diffusion to suggest acute infarction. No midline shift, mass effect, evidence of mass lesion, ventriculomegaly, extra-axial collection or acute intracranial hemorrhage. Cervicomedullary junction and pituitary are within normal limits. Stable cerebral volume since 2018. Scattered periventricular and other cerebral white matter T2 and FLAIR hyperintensity including an area of chronic cystic encephalomalacia in the right corona radiata. Possible mild chronic cortical encephalomalacia in the left motor strip series 6, image 21 (  right hand representation). Aside from the right corona radiata abnormality tracking into the right deep gray nuclei, the deep gray matter  appears spared. There is chronic brainstem and cerebellar volume loss. Comparatively mild chronic corpus callosum volume loss. No new or progressive lesions identified. No abnormal enhancement identified. No dural thickening. Vascular: Major intracranial vascular flow voids are stable. Major dural venous sinuses are enhancing and appear to be patent. Skull and upper cervical spine: Grossly negative cervical spine. Visualized bone marrow signal is within normal limits. Sinuses/Orbits: Grossly negative orbits. Paranasal sinuses and mastoids are stable and well pneumatized. Other: Visible internal auditory structures appear normal. IMPRESSION: 1. Stable MRI appearance of the brain since 2018 with changes compatible with chronic demyelination. Chronic brainstem volume loss. No acute demyelination or progressive disease identified. No new or progressive intracranial abnormality. 2. No new intracranial abnormality. Electronically Signed   By: Genevie Ann M.D.   On: 11/27/2019 16:35   Dg Chest Port 1 View  Result Date: 11/28/2019 CLINICAL DATA:  Increased shortness of breath. EXAM: PORTABLE CHEST 1 VIEW COMPARISON:  11/27/2019 FINDINGS: Normal heart size. Decreased lung volumes. Bilateral pleural effusions are identified, right greater than left. There is associated decreased aeration to the lung bases. IMPRESSION: 1. Persistent bilateral pleural effusions, right greater than left. 2. Decreased aeration to the lung bases compatible with atelectasis and/or infiltrate. Electronically Signed   By: Kerby Moors M.D.   On: 11/28/2019 08:16   Dg Chest Port 1 View  Result Date: 11/27/2019 CLINICAL DATA:  Increased oxygen demand. EXAM: PORTABLE CHEST 1 VIEW COMPARISON:  November 27, 2019 FINDINGS: There is new infiltrate in the right base. The heart, hila, mediastinum, lungs, and pleura are otherwise unremarkable. IMPRESSION: New infiltrate in the right base may represent pneumonia or aspiration. Recommend clinical  correlation and follow-up to resolution. Electronically Signed   By: Dorise Bullion III M.D   On: 11/27/2019 23:00   Dg Chest Portable 1 View  Result Date: 11/27/2019 CLINICAL DATA:  Cough. EXAM: PORTABLE CHEST 1 VIEW COMPARISON:  04/02/2018 FINDINGS: Normal heart size. Decreased lung volumes. No pleural effusion or edema. No airspace densities. IMPRESSION: 1. No acute cardiopulmonary abnormalities. 2. Low lung volumes. Electronically Signed   By: Kerby Moors M.D.   On: 11/27/2019 08:55    Lab Data:  CBC: Recent Labs  Lab 11/27/19 0651 11/27/19 0658 11/28/19 0253 11/29/19 0203  WBC 5.1  --  10.7* 13.1*  NEUTROABS 3.6  --   --   --   HGB 13.8 12.9* 14.6 11.4*  HCT 42.1 38.0* 45.1 35.3*  MCV 94.0  --  94.2 97.0  PLT 128*  --  120* 086*   Basic Metabolic Panel: Recent Labs  Lab 11/27/19 0658 11/27/19 0743 11/28/19 0253 11/29/19 0203  NA 146* 146* 148* 146*  K 4.6 4.9 4.7 4.1  CL 112* 110 113* 117*  CO2  --  27 23 22   GLUCOSE 78 79 66* 87  BUN 30* 32* 35* 27*  CREATININE 0.90 0.90 1.17 1.24  CALCIUM  --  11.4* 10.2 9.2  MG  --  1.8  --   --   PHOS  --  2.9  --   --    GFR: CrCl cannot be calculated (Unknown ideal weight.). Liver Function Tests: Recent Labs  Lab 11/27/19 0743 11/28/19 0618 11/29/19 0203  AST 78* 57* 47*  ALT 114* 89* 72*  ALKPHOS 111 75 78  BILITOT 0.4 1.1 0.9  PROT 7.2 6.0* 5.7*  ALBUMIN  3.9 3.3* 2.8*   No results for input(s): LIPASE, AMYLASE in the last 168 hours. No results for input(s): AMMONIA in the last 168 hours. Coagulation Profile: No results for input(s): INR, PROTIME in the last 168 hours. Cardiac Enzymes: No results for input(s): CKTOTAL, CKMB, CKMBINDEX, TROPONINI in the last 168 hours. BNP (last 3 results) No results for input(s): PROBNP in the last 8760 hours. HbA1C: No results for input(s): HGBA1C in the last 72 hours. CBG: Recent Labs  Lab 11/28/19 0809 11/28/19 1152 11/28/19 1638 11/28/19 2111 11/29/19 0812    GLUCAP 135* 96 80 82 81   Lipid Profile: No results for input(s): CHOL, HDL, LDLCALC, TRIG, CHOLHDL, LDLDIRECT in the last 72 hours. Thyroid Function Tests: No results for input(s): TSH, T4TOTAL, FREET4, T3FREE, THYROIDAB in the last 72 hours. Anemia Panel: Recent Labs    11/28/19 0618  FERRITIN 430*   Urine analysis:    Component Value Date/Time   COLORURINE YELLOW 11/27/2019 0949   APPEARANCEUR HAZY (A) 11/27/2019 0949   LABSPEC 1.020 11/27/2019 0949   PHURINE 7.0 11/27/2019 0949   GLUCOSEU NEGATIVE 11/27/2019 0949   GLUCOSEU NEGATIVE 03/11/2013 1106   HGBUR SMALL (A) 11/27/2019 0949   HGBUR large 10/05/2010 1242   BILIRUBINUR NEGATIVE 11/27/2019 0949   BILIRUBINUR neg 03/11/2013 [employer] 11/27/2019 0949   PROTEINUR NEGATIVE 11/27/2019 0949   UROBILINOGEN 0.2 03/17/2013 1005   NITRITE POSITIVE (A) 11/27/2019 0949   LEUKOCYTESUR LARGE (A) 11/27/2019 0949     Tariana Moldovan M.D. [facility] 11/29/2019, 11:15 AM   Call night coverage person covering after 7pm

## 2019-11-29 NOTE — Progress Notes (Addendum)
NAME:  Stephen Wade, MRN:  [id number], DOB:  [birth year], LOS: 2 ADMISSION DATE:  11/27/2019, CONSULTATION DATE: 12/6 REFERRING MD: Dr. Tana Coast, CHIEF COMPLAINT: Altered mental status, shock  Brief History   68 y.o. M w PMH MS, admitted 12/5 with weakness and AMS.  Developed hypotension and increasing O2 needs 12/6 so transferred to ICU.  History of present illness   68 year old man with multiple sclerosis, GERD, recurrent urinary tract infections, perform self catheterizations.  Evaluated in the ED 12/5 for progressive weakness, dysarthria, confusion for over a week.  Some mild cough reported with no sputum.  In the ED UA consistent with urinary tract infection, antibiotics initiated.  MRI brain 12/5 showed chronic demyelinating process, little change from 2018 without any evidence for an acute flare MS.  He required Ativan in order to tolerate the scan, discontinued to be encephalopathic.  Overnight 12/5-6 noted to be tachycardic in atrial fibrillation, progressively hypothermic, altered.  Poor airway protection noted.  He moved urgently to the SDU.  He converted to NSR with IV fluid boluses but remains hypotensive.  Also quite dysarthric with poor airway protection and increasing oxygen demands.  Repeat chest x-ray 12/6 reveals an evolving right lower lobe infiltrate consistent with an aspiration pneumonia.  Past Medical History   has a past medical history of GERD (gastroesophageal reflux disease), Lower urinary tract symptoms (LUTS), MS (multiple sclerosis) ([place]) (2005), Testicular hypogonadism, Urge incontinence of urine, and Urolithiasis (2011).   Significant Hospital Events   Admitted with weakness, severe sepsis from UTI 12/5 Progressive weakness, obtundation, hypothermia, A. fib plus RVR/shock 12/6  Consults:    Procedures:    Significant Diagnostic Tests:  Head CT 12/5 >> no acute findings  MRI brain 12/5 >> no acute infarct or mass lesion, chronic demyelination changes, stable  compared with 2018, cerebral volume stable as well, no new abnormality  Micro Data:  SARS CoV2 12/5 >> negative MRSA screen 12/5 >> negative Urine 12/5 >> Group B strep > Blood 12/5 >>    Antimicrobials:  Ceftriaxone 12/5 >> 12/6 Zosyn 12/6 >>   Interim history/subjective:  No complaints.  Off neo around 3AM.  Objective   Blood pressure (!) 101/57, pulse 86, temperature 98.2 F (36.8 C), temperature source Oral, resp. rate 18, weight 97.8 kg, SpO2 95 %.        Intake/Output Summary (Last 24 hours) at 11/29/2019 0806 Last data filed at 11/29/2019 0600 Gross per 24 hour  Intake 3343.69 ml  Output 2000 ml  Net 1343.69 ml   Filed Weights   11/28/19 0900 11/29/19 0415  Weight: 96.3 kg 97.8 kg    Examination: General: Adult male, resting in bed, in NAD. Neuro: A&O x 3, no deficits. HEENT: [place]/AT. Sclerae anicteric. EOMI. Cardiovascular: IRIR, no M/R/G.  Lungs: Respirations even and unlabored.  CTA bilaterally, No W/R/R. Abdomen: BS x 4, soft, NT/ND.  Musculoskeletal: No gross deformities, no edema.  Skin: Intact, warm, no rashes.   Assessment & Plan:   Septic shock, initially likely due to urinary tract infection.  Now suspect superimposed right lower lobe aspiration pneumonia.  -Continue fluids -Hopeful to avoid needing to restart low dose neo -No role stress steroids (cortisol 31) -Continue zosyn -DNR / DNI per discussions with wife 12/6  Acute respiratory failure due to inadequate air protection and also evolving lower lobe pneumonia.  Clinically sounds like looks better 12/7 -Continue supplemental O2 as needed to maintain SpO2 > 92% -Continue Zosyn -Bronchial hygiene  Acute on chronic weakness  in the setting of MS, chronic debilitation. -Agree with holding baclofen for now -On ocrelizumab as an outpatient (every 6 months) -May need NG tube for core track placement to allow nutrition, meds, free water  Atrial fibrillation with RVR, now resolved. -Discontinue  diltiazem orders.  It was never started.  Hypernatremia - improving -Continue D5W -Follow BMP  Transaminitis, suspect shock liver - improving. -Follow LFTs for resolution -Consider right upper quadrant ultrasound if persistent or worsening    Best practice:  Diet: RN to perform bedside swallow, advance as able. Pain/Anxiety/Delirium protocol (if indicated): N/A, minimize sedating medications VAP protocol (if indicated): N/A DVT prophylaxis: Enoxaparin GI prophylaxis: Protonix Glucose control: N/A Mobility: Bedrest  Code Status: DNR Family Communication: Dr Delton Coombes discussed the patient's status, his medical history, current interventions with the patient's wife Verlon Au by phone 12/6.  Explained his shock in the setting of UTI and now aspiration pneumonia.  Also explained his poor airway protection.  She describes some slow progressive more subacute worsening in his overall functional capacity, extending back farther than just 1 week.  She notes that the patient would not want life support, life extending measures if he were to decline acutely.  Specifically he would not want intubation or mechanical ventilation, would not want CPR.  She is okay with pressors via a peripheral IV, would like to defer central line placement.  If he acutely declines then would transition him over to comfort care.  DNR orders placed on the chart, will defer central line placement and will use low-dose phenylephrine for pressor support. Disposition: ICU currently.  If remains off pressors through the morning then cant transfer back to SDU.  Remains on [facility] service. Nothing further to add.  PCCM will sign off.  Please do not hesitate to call us back if we can be of any further assistance.    Rutherford Guys, [place] [facility] 11/29/2019, 8:21 AM

## 2019-11-30 ENCOUNTER — Inpatient Hospital Stay (HOSPITAL_COMMUNITY): Payer: Medicare Other

## 2019-11-30 LAB — GLUCOSE, CAPILLARY
Glucose-Capillary: 79 mg/dL (ref 70–99)
Glucose-Capillary: 84 mg/dL (ref 70–99)
Glucose-Capillary: 87 mg/dL (ref 70–99)
Glucose-Capillary: 128 mg/dL — ABNORMAL HIGH (ref 70–99)
Glucose-Capillary: 125 mg/dL — ABNORMAL HIGH (ref 70–99)

## 2019-11-30 LAB — CBC
HCT: 34.3 % — ABNORMAL LOW (ref 39.0–52.0)
Hemoglobin: 11 g/dL — ABNORMAL LOW (ref 13.0–17.0)
MCH: 30.9 pg (ref 26.0–34.0)
MCHC: 32.1 g/dL (ref 30.0–36.0)
MCV: 96.3 fL (ref 80.0–100.0)
Platelets: DECREASED 10*3/uL (ref 150–400)
RBC: 3.56 MIL/uL — ABNORMAL LOW (ref 4.22–5.81)
RDW: 15.8 % — ABNORMAL HIGH (ref 11.5–15.5)
WBC: 6.8 10*3/uL (ref 4.0–10.5)
nRBC: 0.3 % — ABNORMAL HIGH (ref 0.0–0.2)

## 2019-11-30 LAB — BASIC METABOLIC PANEL
Anion gap: 7 (ref 5–15)
BUN: 19 mg/dL (ref 8–23)
CO2: 22 mmol/L (ref 22–32)
Calcium: 9.3 mg/dL (ref 8.9–10.3)
Chloride: 113 mmol/L — ABNORMAL HIGH (ref 98–111)
Creatinine, Ser: 1.1 mg/dL (ref 0.61–1.24)
GFR calc Af Amer: >60 mL/min (ref >60)
GFR calc non Af Amer: >60 mL/min (ref >60)
Glucose, Bld: 82 mg/dL (ref 70–99)
Potassium: 3.9 mmol/L (ref 3.5–5.1)
Sodium: 142 mmol/L (ref 135–145)

## 2019-11-30 MED ORDER — SENNOSIDES-DOCUSATE SODIUM 8.6-50 MG PO TABS
1.0000 | ORAL_TABLET | Freq: Two times a day (BID) | ORAL | Status: DC
  Administered 2019-12-01 – 2019-12-03 (×5): 1 via ORAL
  Filled 2019-11-30 (×6): qty 1

## 2019-11-30 MED ORDER — RESOURCE THICKENUP CLEAR PO POWD
ORAL | Status: DC | PRN
  Filled 2019-11-30: qty 125

## 2019-11-30 MED ORDER — POLYETHYLENE GLYCOL 3350 17 G PO PACK
17.0000 g | PACK | Freq: Every day | ORAL | Status: DC
  Administered 2019-12-01 – 2019-12-03 (×3): 17 g via ORAL
  Filled 2019-11-30 (×4): qty 1

## 2019-11-30 NOTE — Progress Notes (Signed)
Pt temp 95.1 rectal. NP notified of temp and pt's refusal of bairhugger due to it making his MS symptoms worse. Discussed with patient again, pt still refuses but allowed warm blanket to be placed on him. NP notified again. Will continue to monitor.

## 2019-11-30 NOTE — Progress Notes (Signed)
°      ° ° ° [facility] °                                                                            ° °Patient Demographics ° °Stephen Wade, is a 68 y.o. male, DOB - 11/02/1951, MRN:[id number] ° °Admit date - 11/27/2019   Admitting Physician Ripudeep K Rai, MD ° °Outpatient Primary MD for the patient is Paz, [name], MD ° °Outpatient specialists:  ° °LOS - 3  days  ° °Medical records reviewed and are as summarized below: ° ° ° °Chief Complaint  °Patient presents with  °• Weakness  °    ° °Brief summary  ° °Patient is a 68-year-old male with history of MS, GERD, recurrent UTIs, self caths was brought via EMS for concern of worsening weakness, confusion in the last 1 week.  At the time of my examination, patient is extremely drowsy, unable to respond to any commands, had received Ativan IV 1 mg x 2 prior to my encounter for the MRI brain.  History was obtained from patient's wife over the phone.  She reported that at baseline he is wheelchair-bound, has right-sided weakness worse than the left due to MS.  She noticed that in the last 1 week he has been getting more confused, very weak, unable to assist with transfers.  She noticed that initially his speech was slow, then noticed to be slurred and drooling.  Patient's wife noticed that the right-sided weakness was more worse than baseline.  No fevers or chills, chest pain or significant dyspnea, nausea or vomiting.  Wife did notice decreased appetite, possible change of taste over the last few days with mild coughing.  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  Both the patient and his wife had tested negative on 11/18.  Patient's wife denied any other exposures. ° Point-of-care COVID-19 test negative, confirmatory test negative °Overnight, 12/5- 12/6; patient was found to have respiratory distress with increasing O2 demands, tachycardia, hypothermia, rapid response was called.  EKG showed atrial fibrillation with RVR with heart rate 120s to  140s.  Patient was started on IV Cardizem.  He was placed from 2 L of O2 via nasal cannula to NRB mask, bear hugger for hypothermia.  ABG showed pH of 7.3, PCO2 42.8, PO2 85.7, O2 sats 95.9 °Chest x-ray showed new infiltrate in the right base may represent pneumonia or aspiration °CCM was consulted.  Patient was placed on vasopressors due to hypotension. ° °Currently much more alert and awake, off the vasopressors.  CT angiogram of the chest showed no PE but multifocal pneumonia. Failed swallow evaluation ° ° ° °Assessment & Plan  ° ° °Principal Problem: °  Acute respiratory failure with hypoxia (HCC), hypothermia, tachypnea, tachycardia, sepsis °-Overnight on 12/5 -12/6, patient was noted to be hypothermic, in respiratory distress, tachycardia, tachypnea, but was placed on NRB, met sepsis criteria.  Patient was placed on vasopressors due to hypotension with BP in 70s to 80s.  CCM (Dr. Byrum) was consulted, patient DNR/DNI after discussion with patient's wife. °-COVID-19 test negative, BNP 85, CRP 5.8, ferritin 430, D-dimer 0.78.  Cortisol level normal. °-IV Rocephin was discontinued, patient was placed   on IV vancomycin and Zosyn.  MRSA negative, narrowed to IV Zosyn.  °-Off vasopressors.  CT angiogram showed no PE but multifocal pneumonia, moderate bilateral airspace process most prominent over the right lower lobe °-Failed SLP evaluation ° °Active Problems: °Acute metabolic encephalopathy likely due to #1, UTI °-urine culture >100K Group B strept  °-Mental status improving, much more alert and oriented today, speech better ° °  UTI (urinary tract infection), in the setting of self-catheterization, likely neurogenic bladder, present on admission °-Currently on IV Zosyn ° °Atrial fibrillation with RVR °- patient was noted to be in A. fib with RVR on the night 12/5-12/6, was placed on IV Cardizem drip, turned off due to hypotension °-Heart rate controlled, in normal sinus rhythm ° °Acute metabolic encephalopathy  Multiple sclerosis with spastic diplegia °-Continue to hold baclofen due to acute metabolic encephalopathy, °-MRI of the brain with no acute demyelination process, stable changes compatible with chronic demyelination from MS °-PT OT evaluation ° °Pressure injury °Stage I with nonblanchable redness with small blistered areas to the mid bilateral buttocks, present on admission °Wound care per nursing ° °Goals of care in the setting of worsening MS, multifocal pneumonia, respiratory failure, aspiration °-Palliative medicine consulted for GOC ° °Code Status: Full CODE STATUS °DVT Prophylaxis:  Lovenox  °Family Communication: Discussed issues, labs, imaging with patient's wife in detail.  She is agreeable to palliative medicine discussion with the patient as well for goals of care.  Patient had stopped MS medications a year ago. ° °Disposition Plan: Remains inpatient, still n.p.o., failed swallow evaluation ° °Time Spent in minutes 25 mins  ° °Procedures:  °None ° °Consultants:   °PCCM, Dr. Byrum ° °Antimicrobials:  ° °Anti-infectives (From admission, onward)  ° Start     Dose/Rate Route Frequency Ordered Stop  ° 11/28/19 2300  vancomycin (VANCOCIN) IVPB 750 mg/150 ml premix  Status:  Discontinued    ° 750 mg °150 mL/hr over 60 Minutes Intravenous Every 12 hours 11/28/19 0954 11/28/19 1041  ° 11/28/19 1800  piperacillin-tazobactam (ZOSYN) IVPB 3.375 g    ° 3.375 g °12.5 mL/hr over 240 Minutes Intravenous Every 8 hours 11/28/19 1122    ° 11/28/19 1400  cefTRIAXone (ROCEPHIN) 1 g in sodium chloride 0.9 % 100 mL IVPB  Status:  Discontinued    ° 1 g °200 mL/hr over 30 Minutes Intravenous Every 24 hours 11/27/19 2133 11/28/19 0914  ° 11/28/19 1100  vancomycin (VANCOCIN) 2,000 mg in sodium chloride 0.9 % 500 mL IVPB  Status:  Discontinued    ° 2,000 mg °250 mL/hr over 120 Minutes Intravenous  Once 11/28/19 0947 11/28/19 1041  ° 11/28/19 1000  ceFEPIme (MAXIPIME) 2 g in sodium chloride 0.9 % 100 mL IVPB  Status:  Discontinued     ° 2 g °200 mL/hr over 30 Minutes Intravenous Every 8 hours 11/28/19 0946 11/28/19 1041  ° 11/27/19 1415  cefTRIAXone (ROCEPHIN) 1 g in sodium chloride 0.9 % 100 mL IVPB    ° 1 g °200 mL/hr over 30 Minutes Intravenous  Once 11/27/19 1406 11/27/19 1520  °  ° ° ° ° ° °Medications ° °Scheduled Meds: °• chlorhexidine  15 mL Mouth Rinse BID  °• Chlorhexidine Gluconate Cloth  6 each Topical Q0600  °• enoxaparin (LOVENOX) injection  40 mg Subcutaneous Q24H  °• mouth rinse  15 mL Mouth Rinse q12n4p  °• pantoprazole (PROTONIX) IV  40 mg Intravenous QHS  ° °Continuous Infusions: °• sodium chloride Stopped (11/29/19 0943)  °• dextrose 75   75 mL/hr at 11/30/19 0925   phenylephrine (NEO-SYNEPHRINE) Adult infusion Stopped (11/29/19 0301)   piperacillin-tazobactam (ZOSYN)  IV 3.375 g (11/30/19 1118)   PRN Meds:.acetaminophen **OR** acetaminophen, ondansetron **OR** ondansetron (ZOFRAN) IV      Subjective:   Jimmylee Ratterree was seen and examined today.  Much more alert and awake, conversant today.  Speech appears to be better as well.  Appropriately following commands.  Off vasopressors.  BP improving.  Failed swallow evaluation.  O2 weaned down, sat 97% on room air  Objective:   Vitals:   11/30/19 0900 11/30/19 1000 11/30/19 1100 11/30/19 1200  BP: (!) 145/72 133/82 (!) 148/73 140/69  Pulse: 75 81 76 78  Resp: _0 Temp:    (!) 96.3 F (35.7 C)  TempSrc:    Axillary  SpO2: 92% 95% 94% 98%  Weight:        Intake/Output Summary (Last 24 hours) at 11/30/2019 1241 Last data filed at 11/30/2019 1200 Gross per 24 hour  Intake 1720.06 ml  Output 3430 ml  Net -1709.94 ml     Wt Readings from Last 3 Encounters:  11/29/19 97.8 kg  05/29/19 95.3 kg  04/28/18 87.6 kg   Physical Exam  General: Alert and awake, speech much better today, conversant, following commands  Eyes:   HEENT:  Atraumatic, normocephalic  Cardiovascular: S1 S2 clear,  RRR. No pedal edema b/l  Respiratory: Bilateral  rhonchi  Gastrointestinal: Soft, nontender, nondistended, NBS  Ext: no pedal edema bilaterally  Neuro: Bilateral lower extremity weakness, chronic  Musculoskeletal: No cyanosis, clubbing  Skin: No rashes  Psych: Much more alert and awake, close to his baseline, following commands     Data Reviewed:  I have personally reviewed following labs and imaging studies  Micro Results Recent Results (from the past 240 hour(s))  SARS CORONAVIRUS 2 (TAT 6-24 HRS) Nasopharyngeal Nasopharyngeal Swab     Status: None   Collection Time: 11/27/19  7:51 AM   Specimen: Nasopharyngeal Swab  Result Value Ref Range Status   SARS Coronavirus 2 NEGATIVE NEGATIVE Final    Comment: (NOTE) SARS-CoV-2 target nucleic acids are NOT DETECTED. The SARS-CoV-2 RNA is generally detectable in upper and lower respiratory specimens during the acute phase of infection. Negative results do not preclude SARS-CoV-2 infection, do not rule out co-infections with other pathogens, and should not be used as the sole basis for treatment or other patient management decisions. Negative results must be combined with clinical observations, patient history, and epidemiological information. The expected result is Negative. Fact Sheet for Patients: SugarRoll.be Fact Sheet for Healthcare Providers: https://www.woods-mathews.com/ This test is not yet approved or cleared by the [place] FDA and  has been authorized for detection and/or diagnosis of SARS-CoV-2 by FDA under an Emergency Use Authorization (EUA). This EUA will remain  in effect (meaning this test can be used) for the duration of the COVID-19 declaration under Section 56 4(b)(1) of the Act, 21 U.S.C. section 360bbb-3(b)(1), unless the authorization is terminated or revoked sooner. Performed at [facility], [place] [address], [place], [place] [address]   Urine culture     Status: Abnormal   Collection Time:  11/27/19  9:49 AM   Specimen: Urine, Clean Catch  Result Value Ref Range Status   Specimen Description   Final    URINE, CLEAN CATCH Performed at [facility], [place] [address], [place], [place] [address]    Special Requests   Final    NONE  at [facility], [address] [place], [place] [address] °  ° Culture (A)  Final  °  >=100,000 COLONIES/mL GROUP B STREP(S.AGALACTIAE)ISOLATED °TESTING AGAINST S. AGALACTIAE NOT ROUTINELY PERFORMED DUE TO PREDICTABILITY OF AMP/PEN/VAN SUSCEPTIBILITY. °Performed at [facility], [address] [place], [place] [address] °  ° Report Status 11/28/2019 FINAL  Final  °Culture, blood (routine x 2)     Status: None (Preliminary result)  ° Collection Time: 11/27/19  6:17 PM  ° Specimen: BLOOD  °Result Value Ref Range Status  ° Specimen Description   Final  °  BLOOD RIGHT ANTECUBITAL °Performed at [facility], [address] [place], [place] [address] °  ° Special Requests   Final  °  BOTTLES DRAWN AEROBIC AND ANAEROBIC Blood Culture adequate volume °Performed at [facility], [address] [place], [place] [address] °  ° Culture   Final  °  NO GROWTH 3 DAYS °Performed at [facility], [address] [place], [place] [address] °  ° Report Status PENDING  Incomplete  °Culture, blood (routine x 2)     Status: None (Preliminary result)  ° Collection Time: 11/27/19  6:17 PM  ° Specimen: BLOOD  °Result Value Ref Range Status  ° Specimen Description   Final  °  BLOOD RIGHT HAND °Performed at [facility], [address] [place], [place] [address] °  ° Special Requests   Final  °  BOTTLES DRAWN AEROBIC AND ANAEROBIC Blood Culture adequate volume °Performed at [facility], [address] [place], [place] [address] °  ° Culture   Final  °  NO GROWTH 3 DAYS °Performed at [facility], [address] [place], [place] [address] °  ° Report  Status PENDING  Incomplete  °MRSA PCR Screening     Status: None  ° Collection Time: 11/27/19 11:25 PM  ° Specimen: Nasal Mucosa; Nasopharyngeal  °Result Value Ref Range Status  ° MRSA by PCR NEGATIVE NEGATIVE Final  °  Comment:        °The GeneXpert MRSA Assay (FDA °approved for NASAL specimens °only), is one component of a °comprehensive MRSA colonization °surveillance program. It is not °intended to diagnose MRSA °infection nor to guide or °monitor treatment for °MRSA infections. °Performed at [facility], [address] [place], [place] [address] °  ° ° °Radiology Reports °Ct Head Wo Contrast ° °Result Date: 11/27/2019 °CLINICAL DATA:  Increased weakness EXAM: CT HEAD WITHOUT CONTRAST TECHNIQUE: Contiguous axial images were obtained from the base of the skull through the vertex without intravenous contrast. COMPARISON:  Brain MRI 11/04/2017 FINDINGS: Brain: No acute intracranial hemorrhage. No focal mass lesion. No CT evidence of acute infarction. No midline shift or mass effect. No hydrocephalus. Basilar cisterns are patent. Vascular: No hyperdense vessel or unexpected calcification. Skull: Normal. Negative for fracture or focal lesion. Sinuses/Orbits: Paranasal sinuses and mastoid air cells are clear. Orbits are clear. Other: None. IMPRESSION: No acute intracranial findings. Electronically Signed   By: Stewart  Edmunds M.D.   On: 11/27/2019 09:03  ° °Ct Angio Chest Pe W Or Wo Contrast ° °Result Date: 11/29/2019 °CLINICAL DATA:  Positive D-dimer.  Suspect pulmonary embolism. EXAM: CT ANGIOGRAPHY CHEST WITH CONTRAST TECHNIQUE: Multidetector CT imaging of the chest was performed using the standard protocol during bolus administration of intravenous contrast. Multiplanar CT image reconstructions and MIPs were obtained to evaluate the vascular anatomy. CONTRAST:  100mL   OMNIPAQUE IOHEXOL 350 MG/ML SOLN COMPARISON:  CT chest 04/08/2010 and CT abdomen 03/17/2013 FINDINGS: Cardiovascular: Mild stable  cardiomegaly. Minimal calcified plaque over the 3 vessel coronary arteries. Thoracic aorta is normal in caliber without evidence of aneurysm or dissection. Pulmonary arterial system is well opacified without evidence of emboli. Remaining vascular structures are unremarkable. Mediastinum/Nodes: Right infrahilar 1 cm lymph node. No other significant mediastinal or hilar adenopathy. Remaining mediastinal structures are unremarkable. There is prominence of mild heterogeneity of the thyroid gland right worse than left likely goiter. This causes mild deviation of the trachea to the left. Lungs/Pleura: Lungs are adequately inflated demonstrate moderate airspace process over the right lower lobe and minimally involving the posterior right upper lobe as well as the right middle lobe. There is also mild airspace opacification over the left lower lobe. Findings likely due to multifocal pneumonia. No evidence of effusion. Airways are unremarkable. Upper Abdomen: Cholelithiasis. Minimal calcified plaque over the abdominal aorta. Musculoskeletal: No acute findings. Review of the MIP images confirms the above findings. IMPRESSION: 1.  No evidence of pulmonary embolism. 2. Moderate bilateral airspace process most prominent over the right lower lobe likely multifocal pneumonia. 1 cm right infrahilar lymph node likely reactive. 3. Aortic Atherosclerosis (ICD10-I70.0). Atherosclerotic coronary artery disease. 4. Enlarged heterogeneous thyroid gland right worse than left with tracheal deviation slightly to the left. This has been evaluated by previous ultrasound imaging with fine needle aspiration biopsy 2019. 5.  Cholelithiasis. Electronically Signed   By: Daniel  Boyle M.D.   On: 11/29/2019 15:46  ° °Mr Brain W And Wo Contrast ° °Result Date: 11/27/2019 °CLINICAL DATA:  68-year-old male with multiple sclerosis. Increasing right side weakness and limb ataxia. EXAM: MRI HEAD WITHOUT AND WITH CONTRAST TECHNIQUE: Multiplanar, multiecho  pulse sequences of the brain and surrounding structures were obtained without and with intravenous contrast. CONTRAST:  10mL GADAVIST GADOBUTROL 1 MMOL/ML IV SOLN COMPARISON:  Brain, cervical and thoracic spine MRI 11/05/2017. FINDINGS: Brain: No restricted diffusion to suggest acute infarction. No midline shift, mass effect, evidence of mass lesion, ventriculomegaly, extra-axial collection or acute intracranial hemorrhage. Cervicomedullary junction and pituitary are within normal limits. Stable cerebral volume since 2018. Scattered periventricular and other cerebral white matter T2 and FLAIR hyperintensity including an area of chronic cystic encephalomalacia in the right corona radiata. Possible mild chronic cortical encephalomalacia in the left motor strip series 6, image 21 (right hand representation). Aside from the right corona radiata abnormality tracking into the right deep gray nuclei, the deep gray matter appears spared. There is chronic brainstem and cerebellar volume loss. Comparatively mild chronic corpus callosum volume loss. No new or progressive lesions identified. No abnormal enhancement identified. No dural thickening. Vascular: Major intracranial vascular flow voids are stable. Major dural venous sinuses are enhancing and appear to be patent. Skull and upper cervical spine: Grossly negative cervical spine. Visualized bone marrow signal is within normal limits. Sinuses/Orbits: Grossly negative orbits. Paranasal sinuses and mastoids are stable and well pneumatized. Other: Visible internal auditory structures appear normal. IMPRESSION: 1. Stable MRI appearance of the brain since 2018 with changes compatible with chronic demyelination. Chronic brainstem volume loss. No acute demyelination or progressive disease identified. No new or progressive intracranial abnormality. 2. No new intracranial abnormality. Electronically Signed   By: H  Hall M.D.   On: 11/27/2019 16:35  ° °Dg Chest Port 1 View ° °Result  Date: 11/28/2019 °CLINICAL DATA:  Increased shortness of breath. EXAM: PORTABLE CHEST 1 VIEW COMPARISON:  11/27/2019 FINDINGS: Normal heart size. Decreased   Decreased lung volumes. Bilateral pleural effusions are identified, right greater than left. There is associated decreased aeration to the lung bases. IMPRESSION: 1. Persistent bilateral pleural effusions, right greater than left. 2. Decreased aeration to the lung bases compatible with atelectasis and/or infiltrate. Electronically Signed   By: Kerby Moors M.D.   On: 11/28/2019 08:16   Dg Chest Port 1 View  Result Date: 11/27/2019 CLINICAL DATA:  Increased oxygen demand. EXAM: PORTABLE CHEST 1 VIEW COMPARISON:  November 27, 2019 FINDINGS: There is new infiltrate in the right base. The heart, hila, mediastinum, lungs, and pleura are otherwise unremarkable. IMPRESSION: New infiltrate in the right base may represent pneumonia or aspiration. Recommend clinical correlation and follow-up to resolution. Electronically Signed   By: Dorise Bullion III M.D   On: 11/27/2019 23:00   Dg Chest Portable 1 View  Result Date: 11/27/2019 CLINICAL DATA:  Cough. EXAM: PORTABLE CHEST 1 VIEW COMPARISON:  04/02/2018 FINDINGS: Normal heart size. Decreased lung volumes. No pleural effusion or edema. No airspace densities. IMPRESSION: 1. No acute cardiopulmonary abnormalities. 2. Low lung volumes. Electronically Signed   By: Kerby Moors M.D.   On: 11/27/2019 08:55    Lab Data:  CBC: Recent Labs  Lab 11/27/19 0651 11/27/19 0658 11/28/19 0253 11/29/19 0203 11/30/19 0226  WBC 5.1  --  10.7* 13.1* 6.8  NEUTROABS 3.6  --   --   --   --   HGB 13.8 12.9* 14.6 11.4* 11.0*  HCT 42.1 38.0* 45.1 35.3* 34.3*  MCV 94.0  --  94.2 97.0 96.3  PLT 128*  --  120* 128* PLATELET CLUMPS NOTED ON SMEAR, COUNT APPEARS DECREASED   Basic Metabolic Panel: Recent Labs  Lab 11/27/19 0658 11/27/19 0743 11/28/19 0253 11/29/19 0203 11/30/19 0226  NA 146* 146* 148* 146* 142  K 4.6 4.9 4.7  4.1 3.9  CL 112* 110 113* 117* 113*  CO2  --  _0 GLUCOSE 78 79 66* 87 82  BUN 30* 32* 35* 27* 19  CREATININE 0.90 0.90 1.17 1.24 1.10  CALCIUM  --  11.4* 10.2 9.2 9.3  MG  --  1.8  --   --   --   PHOS  --  2.9  --   --   --    GFR: CrCl cannot be calculated (Unknown ideal weight.). Liver Function Tests: Recent Labs  Lab 11/27/19 0743 11/28/19 0618 11/29/19 0203  AST 78* 57* 47*  ALT 114* 89* 72*  ALKPHOS 111 75 78  BILITOT 0.4 1.1 0.9  PROT 7.2 6.0* 5.7*  ALBUMIN 3.9 3.3* 2.8*   No results for input(s): LIPASE, AMYLASE in the last 168 hours. No results for input(s): AMMONIA in the last 168 hours. Coagulation Profile: No results for input(s): INR, PROTIME in the last 168 hours. Cardiac Enzymes: No results for input(s): CKTOTAL, CKMB, CKMBINDEX, TROPONINI in the last 168 hours. BNP (last 3 results) No results for input(s): PROBNP in the last 8760 hours. HbA1C: No results for input(s): HGBA1C in the last 72 hours. CBG: Recent Labs  Lab 11/29/19 1602 11/29/19 1711 11/29/19 1957 11/29/19 2316 11/30/19 0321  GLUCAP 76 84 76 92 79   Lipid Profile: No results for input(s): CHOL, HDL, LDLCALC, TRIG, CHOLHDL, LDLDIRECT in the last 72 hours. Thyroid Function Tests: No results for input(s): TSH, T4TOTAL, FREET4, T3FREE, THYROIDAB in the last 72 hours. Anemia Panel: Recent Labs    11/28/19 0618  FERRITIN 430*   Urine analysis:    Component  Value Date/Time   COLORURINE YELLOW 11/27/2019 0949   APPEARANCEUR HAZY (A) 11/27/2019 0949   LABSPEC 1.020 11/27/2019 0949   PHURINE 7.0 11/27/2019 0949   GLUCOSEU NEGATIVE 11/27/2019 0949   GLUCOSEU NEGATIVE 03/11/2013 1106   HGBUR SMALL (A) 11/27/2019 0949   HGBUR large 10/05/2010 1242   BILIRUBINUR NEGATIVE 11/27/2019 0949   BILIRUBINUR neg 03/11/2013 [place] 11/27/2019 0949   PROTEINUR NEGATIVE 11/27/2019 0949   UROBILINOGEN 0.2 03/17/2013 1005   NITRITE POSITIVE (A) 11/27/2019 0949    LEUKOCYTESUR LARGE (A) 11/27/2019 0949     Linnell Swords M.D. [facility] 11/30/2019, 12:41 PM   Call night coverage person covering after 7pm

## 2019-11-30 NOTE — Progress Notes (Signed)
Modified Barium Swallow Progress Note  Patient Details  Name: Stephen Wade MRN: [id number] Date of Birth: May 28, 1951  Today's Date: 11/30/2019  Modified Barium Swallow completed.  Full report located under Chart Review in the Imaging Section.  Brief recommendations include the following:  Clinical Impression  Patient presents with oropharyngeal dysphagia characterizeed by sensorimotor deficits.  Overall fortunately his swallow is strong without severe retention.   Premature spillage of boluses into pharynx with decreased timing/adequacy of laryngeal closure allowed aspiration of thin via cup with cough response - but not effective to lear.  Controling bolus to tsp amounts of thin with tight chin tuck posture were not aspirated.  At times, pharyngeal swallow is delayed with cracker bolus mixed with secretions pooling in pyriform sinus prior to swallow.  Chronic aspiration of secretions mixed with barium observed t/o tesing.  SlP provided frequent oral suctioning to decrease secretion and barium aspiration.  Pt's cued nor reflexive cough is adequate to clear trachea/larynx and "hock" did not expel bolus from pharynx.    Consumption of intake is laborious for this pt and adequacy of nutrition is concerning with current level of dysphagia.  Given goal is for pt's swallow to get better *per his statement on 11/29/2019, recommend to initiate diet of dys1/nectar or full liquid/nectar thick with strict precautions/compensation strategies.  Advise pt maximize liquid nutrition *eg Ensures.  Thin WATER via tsp with tight chin tuck between meals recommended.    Will need to monitor pt's vitals, etc closely due to his silent nature of dysphagia.  From interview with pt and family, pt with chronic dysphagia that has progressed over the last few months and currently is severely exacerbated with current medical condition.   Recommend to address his dysphagia within his MS due to recent progression and his long term  goals.  Educated pt/wife to results of testing.  SLP will follow up.  Thanks.   Swallow Evaluation Recommendations       SLP Diet Recommendations: Dysphagia 1 (Puree) solids;Nectar thick liquid(vs full liquids, nectar)   Liquid Administration via: Cup;Straw;Spoon   Medication Administration: (medicine crushed if large with pudding)   Supervision: Full supervision/cueing for compensatory strategies   Compensations: Slow rate;Small sips/bites;Effortful swallow(intermittent double swallow)   Postural Changes: Seated upright at 90 degrees;Remain semi-upright after after feeds/meals (Comment)   Oral Care Recommendations: Oral care BID   Other Recommendations: Order thickener from pharmacy;Have oral suction available   Stephen Wade, [facility] SLP Acute Rehab Services Pager [phone] Office [phone]  Stephen Wade 11/30/2019,3:18 PM

## 2019-11-30 NOTE — Progress Notes (Signed)
  Speech Language Pathology Treatment: Dysphagia  Patient Details Name: Stephen Wade MRN: [id number] DOB: 15-Nov-1951 Today's Date: 11/30/2019 Time: 7035-0093 SLP Time Calculation (min) (ACUTE ONLY): 20 min  Assessment / Plan / Recommendation Clinical Impression  Following MBS SLP education session with pt and wife regarding swallow function and compensation strategies.  Advised pt and wife that goal is to decrease aspiration of po intake - NOT secretions.  Educated to importance of oral suctioning during intake to diminish thinning of boluses consumed.  Taught spouse to use of Yan[place]ur.  Also reviewed taxing nature of swallowing and concern for adequacy of nutrition with current dysphagia.      Advised pt follow up with his neurologist as an OP given uncertainty of prognosis of swallow to become functional level.  Reviewed recommendation to make concerted effort to swallow secretions, strengthen hock and cough using teach back.    Will follow up for dysphagia management. Thanks.   HPI HPI: 68 yo male adm tto [facility] with sepsis - recurrent UTIs for which he self caths with AMS = Pt w/ MS, GERD,  = after receiving Ativan he demonstrated decreased mentation - 12/5-12/6 pt became tachycardia ? Afib, hypotensive  and was transferred to SDU.  Is now on oxygen and in ICU.  CXR showed possible progressive RLL pna.  Pt MRI showed demyelinating process which is not significantly worse compared to 2018 MRI.  He demonstrates poor airway protection of secretions today with gross dysarthria.  Pt reports he has h/o poor intake recently with increased difficulties swallowing compared to management of secretions.  Also admits to taste changes.  Swallow eval ordered.      SLP Plan  Continue with current plan of care       Recommendations  Diet recommendations: Nectar-thick liquid(full liquids, nectar) Medication Administration: (with pudding) Compensations: Slow rate;Small sips/bites;Effortful swallow(oral  suction before, during and after intake) Postural Changes and/or Swallow Maneuvers: Seated upright 90 degrees;Upright 30-60 min after meal                Oral Care Recommendations: Oral care QID Follow up Recommendations: Skilled Nursing facility SLP Visit Diagnosis: Dysphagia, oropharyngeal phase (R13.12) Plan: Continue with current plan of care       GO                Macario Golds 11/30/2019, 4:19 PM Luanna Salk, [facility] SLP [place] Pager [phone] Office [phone]

## 2019-11-30 NOTE — Progress Notes (Signed)
  Speech Language Pathology Treatment: Dysphagia  Patient Details Name: Stephen Wade MRN: [id number] DOB: February 01, 1951 Today's Date: 11/30/2019 Time: 9767-3419 SLP Time Calculation (min) (ACUTE ONLY): 23 min  Assessment / Plan / Recommendation Clinical Impression  Pt seen to assess readiness for po intake or MBS.  Pt today remains gurgly but can clear with cued throat clearing.  Pt reports coughing with cereal and this causing discomfort.  Pt reports also changes in taste causing him to decrease intake.  Pt has managed weight and no pneumonias until current admission.  He continues with indications of aspiration with intake of applesauce and nectar thick juice.   Suspect possible weak pharyngeal contraction and aspiration of secretions.  Recommend MBS to allow instrumental evaluation of swallowing to allow pt/family to make educated decisions regarding care plan.  Per wife, pt stopped taking his MS medication approximately 1.5 years ago and admits to decline in function since this time.        HPI HPI: 68 yo male adm tto [facility] with sepsis - recurrent UTIs for which he self caths with AMS = Pt w/ MS, GERD,  = after receiving Ativan he demonstrated decreased mentation - 12/5-12/6 pt became tachycardia ? Afib, hypotensive  and was transferred to SDU.  Is now on oxygen and in ICU.  CXR showed possible progressive RLL pna.  Pt MRI showed demyelinating process which is not significantly worse compared to 2018 MRI.  He demonstrates poor airway protection of secretions today with gross dysarthria.  Pt reports he has h/o poor intake recently with increased difficulties swallowing compared to management of secretions.  Also admits to taste changes.  Swallow eval ordered.      SLP Plan  MBS       Recommendations  Diet recommendations: NPO Medication Administration: Via alternative means                Oral Care Recommendations: Oral care QID SLP Visit Diagnosis: Dysphagia, oropharyngeal phase  (R13.12) Plan: MBS       GO                Macario Golds 11/30/2019, 1:08 PM   Luanna Salk, [facility] SLP Acute Rehab Services Pager [phone] Office [phone]

## 2019-11-30 NOTE — Progress Notes (Signed)
RN made MD aware that pt. Has had persistent hypothermia throughout his stay. It is treatable with a bear hugger and goes back up, but once it is stable and the bear hugger comes off the pt.s temp declines again.

## 2019-12-01 ENCOUNTER — Inpatient Hospital Stay (HOSPITAL_COMMUNITY): Payer: Medicare Other

## 2019-12-01 DIAGNOSIS — K219 Gastro-esophageal reflux disease without esophagitis: Secondary | ICD-10-CM

## 2019-12-01 DIAGNOSIS — Z993 Dependence on wheelchair: Secondary | ICD-10-CM

## 2019-12-01 DIAGNOSIS — Z515 Encounter for palliative care: Secondary | ICD-10-CM

## 2019-12-01 DIAGNOSIS — Z7189 Other specified counseling: Secondary | ICD-10-CM

## 2019-12-01 DIAGNOSIS — G801 Spastic diplegic cerebral palsy: Secondary | ICD-10-CM

## 2019-12-01 DIAGNOSIS — I4891 Unspecified atrial fibrillation: Secondary | ICD-10-CM

## 2019-12-01 DIAGNOSIS — Z66 Do not resuscitate: Secondary | ICD-10-CM

## 2019-12-01 DIAGNOSIS — N390 Urinary tract infection, site not specified: Secondary | ICD-10-CM

## 2019-12-01 LAB — CBC
HCT: 35.3 % — ABNORMAL LOW (ref 39.0–52.0)
Hemoglobin: 11.4 g/dL — ABNORMAL LOW (ref 13.0–17.0)
MCH: 30.7 pg (ref 26.0–34.0)
MCHC: 32.3 g/dL (ref 30.0–36.0)
MCV: 95.1 fL (ref 80.0–100.0)
Platelets: 127 10*3/uL — ABNORMAL LOW (ref 150–400)
RBC: 3.71 MIL/uL — ABNORMAL LOW (ref 4.22–5.81)
RDW: 15.7 % — ABNORMAL HIGH (ref 11.5–15.5)
WBC: 5.4 10*3/uL (ref 4.0–10.5)
nRBC: 0.4 % — ABNORMAL HIGH (ref 0.0–0.2)

## 2019-12-01 LAB — BASIC METABOLIC PANEL
Anion gap: 9 (ref 5–15)
BUN: 14 mg/dL (ref 8–23)
CO2: 21 mmol/L — ABNORMAL LOW (ref 22–32)
Calcium: 9.3 mg/dL (ref 8.9–10.3)
Chloride: 110 mmol/L (ref 98–111)
Creatinine, Ser: 1 mg/dL (ref 0.61–1.24)
GFR calc Af Amer: >60 mL/min (ref >60)
GFR calc non Af Amer: >60 mL/min (ref >60)
Glucose, Bld: 100 mg/dL — ABNORMAL HIGH (ref 70–99)
Potassium: 3.7 mmol/L (ref 3.5–5.1)
Sodium: 140 mmol/L (ref 135–145)

## 2019-12-01 LAB — GLUCOSE, CAPILLARY
Glucose-Capillary: 112 mg/dL — ABNORMAL HIGH (ref 70–99)
Glucose-Capillary: 118 mg/dL — ABNORMAL HIGH (ref 70–99)
Glucose-Capillary: 74 mg/dL (ref 70–99)
Glucose-Capillary: 88 mg/dL (ref 70–99)
Glucose-Capillary: 93 mg/dL (ref 70–99)

## 2019-12-01 LAB — ECHOCARDIOGRAM COMPLETE: Weight: 3382.74 oz

## 2019-12-01 MED ORDER — NAPHAZOLINE-GLYCERIN 0.012-0.2 % OP SOLN
1.0000 [drp] | Freq: Four times a day (QID) | OPHTHALMIC | Status: DC | PRN
  Administered 2019-12-01 – 2019-12-02 (×2): 1 [drp] via OPHTHALMIC
  Filled 2019-12-01: qty 15

## 2019-12-01 MED ORDER — ORAL CARE MOUTH RINSE
15.0000 mL | Freq: Two times a day (BID) | OROMUCOSAL | Status: DC
  Administered 2019-12-02 – 2019-12-07 (×5): 15 mL via OROMUCOSAL

## 2019-12-01 MED ORDER — ERYTHROMYCIN 5 MG/GM OP OINT
1.0000 "application " | TOPICAL_OINTMENT | Freq: Every day | OPHTHALMIC | Status: AC
  Administered 2019-12-02 – 2019-12-03 (×2): 1 via OPHTHALMIC
  Filled 2019-12-01: qty 1
  Filled 2019-12-01: qty 3.5

## 2019-12-01 MED ORDER — HYPROMELLOSE (GONIOSCOPIC) 2.5 % OP SOLN: 1.0000 [drp] | Freq: Three times a day (TID) | OPHTHALMIC | Status: DC

## 2019-12-01 MED ORDER — CHLORHEXIDINE GLUCONATE 0.12 % MT SOLN
15.0000 mL | Freq: Two times a day (BID) | OROMUCOSAL | Status: DC
  Administered 2019-12-01 – 2019-12-07 (×9): 15 mL via OROMUCOSAL
  Filled 2019-12-01 (×12): qty 15

## 2019-12-01 MED ORDER — ORAL CARE MOUTH RINSE
15.0000 mL | Freq: Two times a day (BID) | OROMUCOSAL | Status: DC
  Administered 2019-12-01: 15 mL via OROMUCOSAL

## 2019-12-01 MED ORDER — POLYVINYL ALCOHOL 1.4 % OP SOLN
1.0000 [drp] | Freq: Three times a day (TID) | OPHTHALMIC | Status: DC
  Administered 2019-12-01 – 2019-12-07 (×10): 1 [drp] via OPHTHALMIC
  Filled 2019-12-01: qty 15

## 2019-12-01 NOTE — Progress Notes (Signed)
  Speech Language Pathology Treatment: Dysphagia  Patient Details Name: ZAQUAN DUFFNER MRN: [id number] DOB: [birth year] Today's Date: 12/01/2019 Time: 2836-6294 SLP Time Calculation (min) (ACUTE ONLY): 25 min  Assessment / Plan / Recommendation Clinical Impression  Today pt session focused on education, compensation reinforcement.  Today he is able to read and verbalize compensation strategy clinical reasoning, education re: risks benefits of thickened liquids and increased pulmonary ramification if aspirate solids initially with moderate cues - waning to min for compensations.  Given prior to admission pt was not aware he had dysphagia (pt stated yesterday, "I don't have a hard time swallowing, I have coughing", anticipate he will benefit from reinforcement for generalization.    Using diagram to explain secretion aspiration, pt and spouse implored to strengthen pt's cough, voice and to focus on swallowing secretions during the day as able.  Pt inquiring to source of his "excessive secretions"- suspect due to decreased frequency of swallowing and not increased production.   Pt states his swallow function currently is not as good as baseline and his goal is to recover from this pna and to dc home. Thus compensation for exacerbation of baseline dysphagia is indicated to mitigate aspiration as much as feasibly able.   Risks and benefits of thickened liquids, diet modifiers reviewed with pt and his wife.    SLP reviewed MBS with RN and compensation strategies.  RN reports pt tolerating po well - single cough after consumption of nectar liquids (after pill).   Pt will chronically aspirate secretions and purpose of oral suction before, during and after po is to decrease secretion aspiration and thinning of food/drink he is consuming.  Pt benefited from moderate verbal/visual cues waning to minimal for understanding of compensations by the end of the session.  Wife and pt agreeable to continue diet and SLP.     HPI HPI: 68 yo male adm tto [facility] with sepsis - recurrent UTIs for which he self caths with AMS = Pt w/ MS, GERD,  = after receiving Ativan he demonstrated decreased mentation - 12/5-12/6 pt became tachycardia ? Afib, hypotensive  and was transferred to SDU.  Is now on oxygen and in ICU.  CXR showed possible progressive RLL pna.  Pt MRI showed demyelinating process which is not significantly worse compared to 2018 MRI.  He demonstrates poor airway protection of secretions today with gross dysarthria.  Pt reports he has h/o poor intake recently with increased difficulties swallowing compared to management of secretions.  Also admits to taste changes.  Swallow eval ordered.      SLP Plan  Continue with current plan of care       Recommendations  Diet recommendations: Nectar-thick liquid Liquids provided via: Cup;Straw Medication Administration: (with pudding) Supervision: Patient able to self feed Compensations: Slow rate;Small sips/bites;Effortful swallow(oral suction before, during and after intake) Postural Changes and/or Swallow Maneuvers: Seated upright 90 degrees;Upright 30-60 min after meal                Oral Care Recommendations: Oral care QID SLP Visit Diagnosis: Dysphagia, oropharyngeal phase (R13.12) Plan: Continue with current plan of care       GO                Macario Golds 12/01/2019, 12:32 PM   Luanna Salk, [place] [facility] SLP [place] Pager [phone] Office [phone]

## 2019-12-01 NOTE — Progress Notes (Signed)
Pharmacy Antibiotic Note  Stephen Wade is a 68 y.o. male admitted on 11/27/2019 with r/o PNA, met sepsis criteria.  Pharmacy initially was consulted for Vancomycin & Cefepime dosing, changed to Zosyn per CCM for aspiration PNA  Plan: Continue Zosyn 3.375gm q8, 4 hr infusion    Dosage will likely remain stable at above dosage and need for further dosage adjustment appears unlikely at present.    Will sign off at this time.  Please reconsult if a change in clinical status warrants re-evaluation of dosage.    Weight: 211 lb 6.7 oz (95.9 kg)  Temp (24hrs), Avg:96.5 F (35.8 C), Min:95.1 F (35.1 C), Max:97.7 F (36.5 C)  Recent Labs  Lab 11/27/19 0651  11/27/19 0743 11/27/19 1643 11/28/19 0253 11/29/19 0203 11/30/19 0226 12/01/19 0211  WBC 5.1  --   --   --  10.7* 13.1* 6.8 5.4  CREATININE  --    < > 0.90  --  1.17 1.24 1.10 1.00  LATICACIDVEN  --   --   --  0.8  --   --   --   --    < > = values in this interval not displayed.    CrCl cannot be calculated (Unknown ideal weight.).    No Known Allergies  Antimicrobials this admission: 12/5 CTX x1 for possible UTI 12/6 Cefepime x1 12/6 Zosyn >>   Dose adjustments this admission:  Microbiology results: 12/5 BCx: NGTD 12/5 UCx: >100k Group B strep   12/6 Sputum: obtained  12/5 MRSA PCR: neg 12/5 Covid: neg  Thank you for allowing pharmacy to be a part of this patient's care.   Royetta Asal, PharmD, BCPS 12/01/2019 11:14 AM

## 2019-12-01 NOTE — Consult Note (Signed)
Consultation Note Date: 12/01/2019   Patient Name: Stephen Wade  DOB: 11-Feb-1951  MRN: [id number]  Age / Sex: 68 y.o., male  PCP: Colon Branch, MD Referring Physician: Harold Hedge, MD  Reason for Consultation: Establishing goals of care  HPI/Patient Profile: 68 y.o. male  with past medical history of MS admitted on 11/27/2019 with confusion and weakness.   Clinical Assessment and Goals of Care:  68 yo gentleman who was diagnosed with MS in 2005, he lives at home with wife, he states that he has home health aides currently. He was brought in for confusion and weakness, was admitted and is currently still in stepdown unit at [facility] for septic shock, UTI, asp pna, found to have group B strep in urine. At baseline, the patient is wheel chair bound and has R sided weakness.   Patient's hospital course complicated by hypothermia, resp distress, hypotension. PCCM was consulted, patient and wife elected for DNR DNI.   Patient's CT shows multi focal PNA, COVID negative, SLP evaluation suggests dysphagia I with nectar thick liquids.   A palliative consult has been request for ongoing goals of care discussions.   Patient goes by "Stephen Wade". He is awake alert, is slow to speak, but is oriented and is able to participate in all aspects of history taking and discussions.   I introduced myself, the nature of my visit, palliative care, goals of care as follows:  Palliative medicine is specialized medical care for people living with serious illness. It focuses on providing relief from the symptoms and stress of a serious illness. The goal is to improve quality of life for both the patient and the family.  Goals of care: Broad aims of medical therapy in relation to the patient's values and preferences. Our aim is to provide medical care aimed at enabling patients to achieve the goals that matter most to them,  given the circumstances of their particular medical situation and their constraints.   Patient is well aware of the nature of his current hospitalization. We reviewed that he has had septic shock, UTI as pna. Discussed about his goals, wishes and values.   Patient is aware of the serious incurable nature of his main life limiting illness which is MS. He believes he is getting better, in this hospitalization, thus far. He would like to continue current mode of care.   We discussed about his current home health care options. Option for addition of home based palliative care was also discussed.   Patient complains of having a pink eye, R eye. This was noted. See below. No family at bedside at time of visit.   NEXT OF KIN  lives at home with wife in [place], [place].   SUMMARY OF RECOMMENDATIONS    Agree with DNR Recommend home with out patient palliative care following the patient after discharge.  Continue current mode of care.  Ophthalmic ointment and eye drops for possible pink eye, as per patient request.  Thank you for the consult.  Code Status/Advance Care Planning:  DNR    Symptom Management:    as above   Palliative Prophylaxis:   Bowel Regimen   Psycho-social/Spiritual:   Desire for further Chaplaincy support:yes  Additional Recommendations: Caregiving  Support/Resources  Prognosis:   Unable to determine  Discharge Planning: To Be Determined      Primary Diagnoses: Present on Admission: . UTI (urinary tract infection) . Acute metabolic encephalopathy . BPH - self caths . GERD . Multiple sclerosis (HCC) . Acute respiratory failure with hypoxia (HCC) . Pneumonia . Spastic diplegia (HCC) . Atrial fibrillation with RVR (HCC)   I have reviewed the medical record, interviewed the patient and family, and examined the patient. The following aspects are pertinent.  Past Medical History:  Diagnosis Date  . GERD (gastroesophageal reflux disease)    had on-  off chest pain, symptoms resolved with Prilosec 11/2010  . Lower urinary tract symptoms (LUTS)    Dr Patsi Sears  . MS (multiple sclerosis) (HCC) 2005  . Testicular hypogonadism   . Urge incontinence of urine   . Urolithiasis 2011   Social History   Socioeconomic History  . Marital status: Married    Spouse name: Not on file  . Number of children: 3  . Years of education: Not on file  . Highest education level: Not on file  Occupational History  . Occupation: still works some from home    Employer: [employer]  Social Needs  . Financial resource strain: Not on file  . Food insecurity    Worry: Not on file    Inability: Not on file  . Transportation needs    Medical: Not on file    Non-medical: Not on file  Tobacco Use  . Smoking status: Former Games developer  . Smokeless tobacco: Never Used  . Tobacco comment: 3 ppd, quit 1999  Substance and Sexual Activity  . Alcohol use: No    Alcohol/week: 0.0 standard drinks    Comment: rare  . Drug use: No  . Sexual activity: Not on file  Lifestyle  . Physical activity    Days per week: Not on file    Minutes per session: Not on file  . Stress: Not on file  Relationships  . Social Musician on phone: Not on file    Gets together: Not on file    Attends religious service: Not on file    Active member of club or organization: Not on file    Attends meetings of clubs or organizations: Not on file    Relationship status: Not on file  Other Topics Concern  . Not on file  Social History Narrative   Lives w/ wife    Family History  Problem Relation Age of Onset  . Heart attack Mother   . Brain cancer Father   . Colon cancer Neg Hx   . Prostate cancer Neg Hx    Scheduled Meds: . Chlorhexidine Gluconate Cloth  6 each Topical Q0600  . enoxaparin (LOVENOX) injection  40 mg Subcutaneous Q24H  . mouth rinse  15 mL Mouth Rinse BID  . pantoprazole (PROTONIX) IV  40 mg Intravenous QHS  . polyethylene glycol  17 g Oral  Daily  . polyvinyl alcohol  1 drop Right Eye TID  . senna-docusate  1 tablet Oral BID   Continuous Infusions: . sodium chloride Stopped (12/01/19 1120)  . piperacillin-tazobactam (ZOSYN)  IV 12.5 mL/hr at 12/01/19 1200   PRN Meds:.acetaminophen **OR** acetaminophen, ondansetron **OR** ondansetron (ZOFRAN)  IV, Resource ThickenUp Clear Medications Prior to Admission:  Prior to Admission medications   Medication Sig Start Date End Date Taking? Authorizing Provider  baclofen (LIORESAL) 10 MG tablet Take 1 tablet (10 mg total) by mouth 3 (three) times daily. 03/04/19  Yes Sater, Pearletha Furl, MD  omeprazole (PRILOSEC) 20 MG capsule Take 20 mg daily as needed by mouth (for acid reflex).    Yes [provider]  ocrelizumab (OCREVUS) 300 MG/10ML injection Inject 300 mg every 6 (six) months into the vein.    [provider]   No Known Allergies Review of Systems R eye redness, itching and watery discharge.   Physical Exam Awake alert A little slow with his words and speech  Regular S1 S2 No edema Non focal R eye redness, itching and watery discharge.  Vital Signs: BP 126/66   Pulse 78   Temp 97.7 F (36.5 C) (Oral)   Resp 16   Wt 95.9 kg   SpO2 93%   BMI 28.67 kg/m  Pain Scale: 0-10   Pain Score: 0-No pain   SpO2: SpO2: 93 % O2 Device:SpO2: 93 % O2 Flow Rate: .O2 Flow Rate (L/min): 2 L/min  IO: Intake/output summary:   Intake/Output Summary (Last 24 hours) at 12/01/2019 1253 Last data filed at 12/01/2019 1200 Gross per 24 hour  Intake 2778.08 ml  Output 2100 ml  Net 678.08 ml    LBM: Last BM Date: (UTA) Baseline Weight: Weight: 96.3 kg Most recent weight: Weight: 95.9 kg     Palliative Assessment/Data:   PPS 40%  Time In:  12 Time Out:  1300 Time Total:  60 Greater than 50%  of this time was spent counseling and coordinating care related to the above assessment and plan.  Signed by: Rosalin Hawking, MD   Please contact Palliative Medicine Team  phone at [phone] for questions and concerns.  For individual provider: See Loretha Stapler

## 2019-12-01 NOTE — Progress Notes (Signed)
PROGRESS NOTE    Stephen Wade    Code Status: DNR  NID:[id number] DOB: [birth year] DOA: 11/27/2019  PCP: Colon Branch, MD    Hospital Summary  This is a 68 year old male with past medical history of MS (not taking his medication on a wheelchair-bound at baseline and with baseline R>L weakness), GERD, recurrent UTIs and self-catheterization for neurogenic bladder who presented to the ED via EMS for concern of worsening weakness confusion the last 1 week prior to arrival as well as increased weakness and difficulty with transfers.  Patient was noted to be drowsy in the ED after receiving Ativan prior to MRI brain.  Home health aide had tested positive for COVID-19 1 month ago, patient tested negative x2.  12/5-6: Found to be in respiratory distress with increased O2 demands, tachycardia, hypothermia, rapid response was called.  EKG showing atrial fibrillation with RVR and heart rates 120s to 140s.  He was started on IV Cardizem and required nonrebreather mask.  Bear hugger for hypothermia.  ABG: pH 7.3, PCO2 43, PO2 86, O2 96 CXR with new right lower lobe infiltrate concerning for aspiration pneumonia.  PCCM was consulted and patient was placed on phenylephrine due to hypotension (discontinued 12/7).  12/9: Transferred from stepdown to med telemetry  A & P   Principal Problem:   Acute respiratory failure with hypoxia (HCC) Active Problems:   GERD   BPH - self caths   Spastic diplegia (HCC)   Multiple sclerosis (HCC)   UTI (urinary tract infection)   Acute metabolic encephalopathy   Pneumonia   Atrial fibrillation with RVR (HCC)  Acute hypoxic respiratory failure, secondary to multifocal pneumonia in setting of MS and A. fib with RVR Now tolerating room air, sepsis resolved and off vasopressors since 12/7 Significant event 12/5 -12/6, hypothermic, in respiratory distress, tachycardia, tachypnea, but was placed on NRB, met sepsis criteria.  Patient was placed on phenylephrine due to  hypotension with BP in 70s to 80s. CCM (Dr. Lamonte Sakai) was consulted, patient DNR/DNI after discussion with patient's wife. COVID-19 test negative, BNP 85, CRP 5.8, ferritin 430, D-dimer 0.78 (CTA chest negative for PE but with multifocal pneumonia).  Cortisol level normal. IV Rocephin was discontinued, patient was placed on IV vancomycin and Zosyn.  MRSA negative, narrowed to IV Zosyn.  (Day 3 antibiotics) -Failed SLP evaluation: Continue nectar thick liquid -Continue Zosyn.  Will likely need total 7 days antibiotics -Check TSH in a.m. due to hypothermia  Acute metabolic encephalopathy due to #1 plus group B strep UTI Encephalopathy is resolved -Continue Zosyn  Group B strep UTI in setting of recurrent UTIs with history of suspected neurogenic bladder from MS and self catheterizations  Present on admission Currently with Foley -Continue Zosyn  Atrial fibrillation with RVR patient was noted to be in A. fib with RVR on the night 12/5-12/6, was placed on IV Cardizem drip, turned off due to hypotension.  Unknown CHA2DS2-VASc at this time -Echo -TSH  Acute metabolic encephalopathy Multiple sclerosis with spastic diplegia -Continue to hold baclofen due to acute metabolic encephalopathy, -MRI of the brain with no acute demyelination process, stable changes compatible with chronic demyelination from MS -PT OT evaluation  Pressure injury Stage I with nonblanchable redness with small blistered areas to the mid bilateral buttocks, present on admission Wound care per nursing  Goals of care in the setting of worsening MS, multifocal pneumonia, respiratory failure, aspiration -Palliative medicine consulted for GOC  Right eye viral conjunctivitis -Eyedrops and routine eye care  Hypoglycemia resolved -Discontinue D5W  Hypernatremia resolved with D5W -Discontinue fluids  Multiple sclerosis likely in pseudoflare given worsening symptoms similar to baseline, unchanged MRI brain and acute  infection as above.  Patient self discontinued his ocrelizumab -Treat underlying illness -Consider restarting home medication -Currently off his baclofen due to mental status changes from before -Physical therapy   DVT prophylaxis: Lovenox Diet: Nectar thick liquid Family Communication: Patient's wife has been updated over the phone Disposition Plan: Transfer to med telemetry  Consultants  Palliative care PCCM  Procedures  None  Antibiotics   Anti-infectives (From admission, onward)   Start     Dose/Rate Route Frequency Ordered Stop   11/28/19 2300  vancomycin (VANCOCIN) IVPB 750 mg/150 ml premix  Status:  Discontinued     750 mg 150 mL/hr over 60 Minutes Intravenous Every 12 hours 11/28/19 0954 11/28/19 1041   11/28/19 1800  piperacillin-tazobactam (ZOSYN) IVPB 3.375 g     3.375 g 12.5 mL/hr over 240 Minutes Intravenous Every 8 hours 11/28/19 1122     11/28/19 1400  cefTRIAXone (ROCEPHIN) 1 g in sodium chloride 0.9 % 100 mL IVPB  Status:  Discontinued     1 g 200 mL/hr over 30 Minutes Intravenous Every 24 hours 11/27/19 2133 11/28/19 0914   11/28/19 1100  vancomycin (VANCOCIN) 2,000 mg in sodium chloride 0.9 % 500 mL IVPB  Status:  Discontinued     2,000 mg 250 mL/hr over 120 Minutes Intravenous  Once 11/28/19 0947 11/28/19 1041   11/28/19 1000  ceFEPIme (MAXIPIME) 2 g in sodium chloride 0.9 % 100 mL IVPB  Status:  Discontinued     2 g 200 mL/hr over 30 Minutes Intravenous Every 8 hours 11/28/19 0946 11/28/19 1041   11/27/19 1415  cefTRIAXone (ROCEPHIN) 1 g in sodium chloride 0.9 % 100 mL IVPB     1 g 200 mL/hr over 30 Minutes Intravenous  Once 11/27/19 1406 11/27/19 1520           Subjective   Patient seen and examined at bedside no acute distress resting comfortably.  Complains of right eye pruritus.  Otherwise denies any complaints.  Objective   Vitals:   12/01/19 1000 12/01/19 1110 12/01/19 1200 12/01/19 1300  BP: 108/62 129/74 126/66 (!) 137/96  Pulse: 81  81 78 77  Resp: _0 Temp:      TempSrc:      SpO2: 94% 93% 93% 94%  Weight:        Intake/Output Summary (Last 24 hours) at 12/01/2019 1433 Last data filed at 12/01/2019 1300 Gross per 24 hour  Intake 2790.62 ml  Output 2100 ml  Net 690.62 ml   Filed Weights   11/28/19 0900 11/29/19 0415 12/01/19 0500  Weight: 96.3 kg 97.8 kg 95.9 kg    Examination:  Physical Exam Vitals signs and nursing note reviewed. Exam conducted with a chaperone present.  Constitutional:      Appearance: He is not ill-appearing.  HENT:     Head: Normocephalic.     Mouth/Throat:     Mouth: Mucous membranes are moist.  Eyes:     General:        Right eye: No discharge.        Left eye: No discharge.     Extraocular Movements: Extraocular movements intact.     Comments: Right eye erythema  Cardiovascular:     Rate and Rhythm: Normal rate and regular rhythm.     Heart sounds: No murmur.  Pulmonary:     Effort: Pulmonary effort is normal.     Breath sounds: Normal breath sounds. No stridor.  Abdominal:     General: Abdomen is flat.     Palpations: Abdomen is soft.  Musculoskeletal:        General: No swelling or tenderness.     Comments: Decreased range of motion with baseline R>L weakness  Neurological:     Mental Status: He is alert and oriented to person, place, and time. Mental status is at baseline.  Psychiatric:        Mood and Affect: Mood normal.        Behavior: Behavior normal.     Data Reviewed: I have personally reviewed following labs and imaging studies  CBC: Recent Labs  Lab 11/27/19 0651 11/27/19 0658 11/28/19 0253 11/29/19 0203 11/30/19 0226 12/01/19 0211  WBC 5.1  --  10.7* 13.1* 6.8 5.4  NEUTROABS 3.6  --   --   --   --   --   HGB 13.8 12.9* 14.6 11.4* 11.0* 11.4*  HCT 42.1 38.0* 45.1 35.3* 34.3* 35.3*  MCV 94.0  --  94.2 97.0 96.3 95.1  PLT 128*  --  120* 128* PLATELET CLUMPS NOTED ON SMEAR, COUNT APPEARS DECREASED 347*   Basic Metabolic  Panel: Recent Labs  Lab 11/27/19 0743 11/28/19 0253 11/29/19 0203 11/30/19 0226 12/01/19 0211  NA 146* 148* 146* 142 140  K 4.9 4.7 4.1 3.9 3.7  CL 110 113* 117* 113* 110  CO2 _0 21*  GLUCOSE 79 66* 87 82 100*  BUN 32* 35* 27* 19 14  CREATININE 0.90 1.17 1.24 1.10 1.00  CALCIUM 11.4* 10.2 9.2 9.3 9.3  MG 1.8  --   --   --   --   PHOS 2.9  --   --   --   --    GFR: CrCl cannot be calculated (Unknown ideal weight.). Liver Function Tests: Recent Labs  Lab 11/27/19 0743 11/28/19 0618 11/29/19 0203  AST 78* 57* 47*  ALT 114* 89* 72*  ALKPHOS 111 75 78  BILITOT 0.4 1.1 0.9  PROT 7.2 6.0* 5.7*  ALBUMIN 3.9 3.3* 2.8*   No results for input(s): LIPASE, AMYLASE in the last 168 hours. No results for input(s): AMMONIA in the last 168 hours. Coagulation Profile: No results for input(s): INR, PROTIME in the last 168 hours. Cardiac Enzymes: No results for input(s): CKTOTAL, CKMB, CKMBINDEX, TROPONINI in the last 168 hours. BNP (last 3 results) No results for input(s): PROBNP in the last 8760 hours. HbA1C: No results for input(s): HGBA1C in the last 72 hours. CBG: Recent Labs  Lab 11/30/19 1951 12/01/19 0004 12/01/19 0409 12/01/19 0746 12/01/19 1251  GLUCAP 125* 112* 88 93 74   Lipid Profile: No results for input(s): CHOL, HDL, LDLCALC, TRIG, CHOLHDL, LDLDIRECT in the last 72 hours. Thyroid Function Tests: No results for input(s): TSH, T4TOTAL, FREET4, T3FREE, THYROIDAB in the last 72 hours. Anemia Panel: No results for input(s): VITAMINB12, FOLATE, FERRITIN, TIBC, IRON, RETICCTPCT in the last 72 hours. Sepsis Labs: Recent Labs  Lab 11/27/19 1643 11/28/19 0618  PROCALCITON  --  7.64  LATICACIDVEN 0.8  --     Recent Results (from the past 240 hour(s))  SARS CORONAVIRUS 2 (TAT 6-24 HRS) Nasopharyngeal Nasopharyngeal Swab     Status: None   Collection Time: 11/27/19  7:51 AM   Specimen: Nasopharyngeal Swab  Result Value Ref Range Status   SARS  Coronavirus 2  NEGATIVE NEGATIVE Final    Comment: (NOTE) SARS-CoV-2 target nucleic acids are NOT DETECTED. The SARS-CoV-2 RNA is generally detectable in upper and lower respiratory specimens during the acute phase of infection. Negative results do not preclude SARS-CoV-2 infection, do not rule out co-infections with other pathogens, and should not be used as the sole basis for treatment or other patient management decisions. Negative results must be combined with clinical observations, patient history, and epidemiological information. The expected result is Negative. Fact Sheet for Patients: SugarRoll.be Fact Sheet for Healthcare Providers: https://www.woods-mathews.com/ This test is not yet approved or cleared by the [place] FDA and  has been authorized for detection and/or diagnosis of SARS-CoV-2 by FDA under an Emergency Use Authorization (EUA). This EUA will remain  in effect (meaning this test can be used) for the duration of the COVID-19 declaration under Section 56 4(b)(1) of the Act, 21 U.S.C. section 360bbb-3(b)(1), unless the authorization is terminated or revoked sooner. Performed at [facility], [place] [address] [place], [place] [address]   Urine culture     Status: Abnormal   Collection Time: 11/27/19  9:49 AM   Specimen: Urine, Clean Catch  Result Value Ref Range Status   Specimen Description   Final    URINE, CLEAN CATCH Performed at [facility], [place] [address] [place], [place] [address]    Special Requests   Final    NONE Performed at [facility], [place] [address], [place], [place] [address]    Culture (A)  Final    >=100,000 COLONIES/mL GROUP B STREP(S.AGALACTIAE)ISOLATED TESTING AGAINST S. AGALACTIAE NOT ROUTINELY PERFORMED DUE TO PREDICTABILITY OF AMP/PEN/VAN SUSCEPTIBILITY. Performed at [facility], [place] [address] [place], [place] [address]    Report Status  11/28/2019 FINAL  Final  Culture, blood (routine x 2)     Status: None (Preliminary result)   Collection Time: 11/27/19  6:17 PM   Specimen: BLOOD  Result Value Ref Range Status   Specimen Description   Final    BLOOD RIGHT ANTECUBITAL Performed at [place] [address] [place], [place] [address]    Special Requests   Final    BOTTLES DRAWN AEROBIC AND ANAEROBIC Blood Culture adequate volume Performed at [facility] [address] [place], [place] [address]    Culture   Final    NO GROWTH 4 DAYS Performed at [facility], [place] [address] [place], [place] [address]    Report Status PENDING  Incomplete  Culture, blood (routine x 2)     Status: None (Preliminary result)   Collection Time: 11/27/19  6:17 PM   Specimen: BLOOD  Result Value Ref Range Status   Specimen Description   Final    BLOOD RIGHT HAND Performed at [place] [address], [place], [place] [address]    Special Requests   Final    BOTTLES DRAWN AEROBIC AND ANAEROBIC Blood Culture adequate volume Performed at [place] [address], [place], [place] [address]    Culture   Final    NO GROWTH 4 DAYS Performed at [facility], [place] [address], [place], [place] [address]    Report Status PENDING  Incomplete  MRSA PCR Screening     Status: None   Collection Time: 11/27/19 11:25 PM   Specimen: Nasal Mucosa; Nasopharyngeal  Result Value Ref Range Status   MRSA by PCR NEGATIVE NEGATIVE Final  Comment:        The GeneXpert MRSA Assay (FDA approved for NASAL specimens only), is one component of a comprehensive MRSA colonization surveillance program. It is not intended to diagnose MRSA infection nor to guide or monitor treatment for MRSA infections. Performed at [facility], [place] [address], [place], [place] [address]          Radiology Studies: Ct Angio Chest Pe W Or Wo  Contrast  Result Date: 11/29/2019 CLINICAL DATA:  Positive D-dimer.  Suspect pulmonary embolism. EXAM: CT ANGIOGRAPHY CHEST WITH CONTRAST TECHNIQUE: Multidetector CT imaging of the chest was performed using the standard protocol during bolus administration of intravenous contrast. Multiplanar CT image reconstructions and MIPs were obtained to evaluate the vascular anatomy. CONTRAST:  146m OMNIPAQUE IOHEXOL 350 MG/ML SOLN COMPARISON:  CT chest 04/08/2010 and CT abdomen 03/17/2013 FINDINGS: Cardiovascular: Mild stable cardiomegaly. Minimal calcified plaque over the 3 vessel coronary arteries. Thoracic aorta is normal in caliber without evidence of aneurysm or dissection. Pulmonary arterial system is well opacified without evidence of emboli. Remaining vascular structures are unremarkable. Mediastinum/Nodes: Right infrahilar 1 cm lymph node. No other significant mediastinal or hilar adenopathy. Remaining mediastinal structures are unremarkable. There is prominence of mild heterogeneity of the thyroid gland right worse than left likely goiter. This causes mild deviation of the trachea to the left. Lungs/Pleura: Lungs are adequately inflated demonstrate moderate airspace process over the right lower lobe and minimally involving the posterior right upper lobe as well as the right middle lobe. There is also mild airspace opacification over the left lower lobe. Findings likely due to multifocal pneumonia. No evidence of effusion. Airways are unremarkable. Upper Abdomen: Cholelithiasis. Minimal calcified plaque over the abdominal aorta. Musculoskeletal: No acute findings. Review of the MIP images confirms the above findings. IMPRESSION: 1.  No evidence of pulmonary embolism. 2. Moderate bilateral airspace process most prominent over the right lower lobe likely multifocal pneumonia. 1 cm right infrahilar lymph node likely reactive. 3. Aortic Atherosclerosis (ICD10-I70.0). Atherosclerotic coronary artery disease. 4.  Enlarged heterogeneous thyroid gland right worse than left with tracheal deviation slightly to the left. This has been evaluated by previous ultrasound imaging with fine needle aspiration biopsy 2019. 5.  Cholelithiasis. Electronically Signed   By: DMarin OlpM.D.   On: 11/29/2019 15:46   Dg Swallowing Func-speech Pathology  Result Date: 11/30/2019 Objective Swallowing Evaluation: Type of Study: MBS-Modified Barium Swallow Study  Patient Details Name: JKOLTON KIENLEMRN: [id number] of Birth: 1Sep 29, 1952Today's Date: 11/30/2019 Time: SLP Start Time (ACUTE ONLY): 157-SLP Stop Time (ACUTE ONLY): 19794SLP Time Calculation (min) (ACUTE ONLY): 29 min Past Medical History: Past Medical History: Diagnosis Date  GERD (gastroesophageal reflux disease)   had on- off chest pain, symptoms resolved with Prilosec 11/2010  Lower urinary tract symptoms (LUTS)   Dr TGaynelle Arabian MS (multiple sclerosis) ([facility] 2005  Testicular hypogonadism   Urge incontinence of urine   Urolithiasis 2011 Past Surgical History: Past Surgical History: Procedure Laterality Date  APPENDECTOMY     ruptured 03-2010  VASECTOMY   H[facility] 68[facility] - [facility] for which he self caths with AMS = Pt w/ MS, GERD,  = after receiving Ativan he demonstrated decreased mentation - 12/5-12/6 pt became tachycardia ? Afib, hypotensive  and was transferred to SDU.  Is now on oxygen and in ICU.  CXR showed possible progressive RLL pna.  Pt MRI showed demyelinating process which is not significantly worse  compared to 2018 MRI.  He demonstrates poor airway protection of secretions today with gross dysarthria.  Pt reports he has h/o poor intake recently with increased difficulties swallowing compared to management of secretions.  Also admits to taste changes.  Swallow eval ordered.  Subjective: pt awake in chair Assessment / Plan / Recommendation CHL IP CLINICAL IMPRESSIONS 11/30/2019 Clinical Impression Patient presents with  oropharyngeal dysphagia characterizeed by sensorimotor deficits.  Overall fortunately his swallow is strong without severe retention.   Premature spillage of boluses into pharynx with decreased timing/adequacy of laryngeal closure allowed aspiration of thin via cup with cough response - but not effective to lear.  Controling bolus to tsp amounts of thin with tight chin tuck posture were not aspirated.  At times, pharyngeal swallow is delayed with cracker bolus mixed with secretions pooling in pyriform sinus prior to swallow.  Chronic aspiration of secretions mixed with barium observed t/o tesing.  SlP provided frequent oral suctioning to decrease secretion and barium aspiration.  Pt's cued nor reflexive cough is adequate to clear trachea/larynx and "hock" did not expel bolus from pharynx.  Consumption of intake is laborious for this pt and adequacy of nutrition is concerning with current level of dysphagia.  Given goal is for pt's swallow to get better *per his statement on 11/29/2019, recommend to initiate diet of dys1/nectar or full liquid/nectar thick with strict precautions/compensation strategies.  Advise pt maximize liquid nutrition *eg Ensures.  Thin WATER via tsp with tight chin tuck between meals recommended.  Will need to monitor pt's vitals, etc closely due to his silent nature of dysphagia.  From interview with pt and family, pt with chronic dysphagia that has progressed over the last few months and currently is severely exacerbated with current medical condition. Recommend to address his dysphagia within his MS due to recent progression and his long term goals.  Educated pt/wife to results of testing.  SLP will follow up.  Thanks. SLP Visit Diagnosis Dysphagia, oropharyngeal phase (R13.12) Attention and concentration deficit following -- Frontal lobe and executive function deficit following -- Impact on safety and function --   CHL IP TREATMENT RECOMMENDATION 11/30/2019 Treatment Recommendations Therapy  as outlined in treatment plan below   Prognosis 11/30/2019 Prognosis for Safe Diet Advancement Fair Barriers to Reach Goals Severity of deficits;Time post onset Barriers/Prognosis Comment -- CHL IP DIET RECOMMENDATION 11/30/2019 SLP Diet Recommendations Dysphagia 1 (Puree) solids;Nectar thick liquid Liquid Administration via Cup;Straw;Spoon Medication Administration (No Data) Compensations Slow rate;Small sips/bites;Effortful swallow Postural Changes Seated upright at 90 degrees;Remain semi-upright after after feeds/meals (Comment)   CHL IP OTHER RECOMMENDATIONS 11/30/2019 Recommended Consults -- Oral Care Recommendations Oral care BID Other Recommendations Order thickener from pharmacy;Have oral suction available   CHL IP FOLLOW UP RECOMMENDATIONS 11/30/2019 Follow up Recommendations Skilled Nursing facility   [facility] IP FREQUENCY AND DURATION 11/30/2019 Speech Therapy Frequency (ACUTE ONLY) min 2x/week Treatment Duration 2 weeks      CHL IP ORAL PHASE 11/30/2019 Oral Phase Impaired Oral - Pudding Teaspoon -- Oral - Pudding Cup -- Oral - Honey Teaspoon Delayed oral transit;Weak lingual manipulation;Decreased bolus cohesion;Premature spillage;Reduced posterior propulsion Oral - Honey Cup -- Oral - Nectar Teaspoon Delayed oral transit;Weak lingual manipulation;Decreased bolus cohesion;Premature spillage;Reduced posterior propulsion;Right anterior bolus loss Oral - Nectar Cup Reduced posterior propulsion;Premature spillage;Decreased bolus cohesion;Weak lingual manipulation;Delayed oral transit;Right anterior bolus loss Oral - Nectar Straw -- Oral - Thin Teaspoon Right anterior bolus loss;Weak lingual manipulation;Reduced posterior propulsion;Delayed oral transit;Decreased bolus cohesion;Lingual pumping Oral - Thin Cup Premature spillage;Decreased  bolus cohesion;Delayed oral transit;Reduced posterior propulsion;Weak lingual manipulation;Lingual pumping Oral - Thin Straw -- Oral - Puree Premature spillage;Reduced posterior  propulsion;Delayed oral transit;Lingual/palatal residue;Weak lingual manipulation;Lingual pumping Oral - Mech Soft Premature spillage;Reduced posterior propulsion;Delayed oral transit;Lingual/palatal residue;Lingual pumping;Weak lingual manipulation;Impaired mastication Oral - Regular -- Oral - Multi-Consistency -- Oral - Pill -- Oral Phase - Comment --  CHL IP PHARYNGEAL PHASE 11/30/2019 Pharyngeal Phase Impaired Pharyngeal- Pudding Teaspoon -- Pharyngeal -- Pharyngeal- Pudding Cup -- Pharyngeal -- Pharyngeal- Honey Teaspoon Delayed swallow initiation-pyriform sinuses;Pharyngeal residue - valleculae Pharyngeal Material does not enter airway Pharyngeal- Honey Cup -- Pharyngeal -- Pharyngeal- Nectar Teaspoon Delayed swallow initiation-pyriform sinuses Pharyngeal Material does not enter airway Pharyngeal- Nectar Cup Delayed swallow initiation-pyriform sinuses;Penetration/Aspiration during swallow Pharyngeal Material enters airway, remains ABOVE vocal cords then ejected out Pharyngeal- Nectar Straw -- Pharyngeal -- Pharyngeal- Thin Teaspoon Delayed swallow initiation-pyriform sinuses;Moderate aspiration;Penetration/Aspiration during swallow Pharyngeal Material enters airway, passes BELOW cords and not ejected out despite cough attempt by patient Pharyngeal- Thin Cup Penetration/Aspiration during swallow;Delayed swallow initiation-pyriform sinuses Pharyngeal Material enters airway, passes BELOW cords without attempt by patient to eject out (silent aspiration);Material enters airway, passes BELOW cords and not ejected out despite cough attempt by patient Pharyngeal- Thin Straw -- Pharyngeal -- Pharyngeal- Puree Delayed swallow initiation-vallecula Pharyngeal Material does not enter airway Pharyngeal- Mechanical Soft Delayed swallow initiation-pyriform sinuses;Penetration/Apiration after swallow Pharyngeal Material enters airway, passes BELOW cords without attempt by patient to eject out (silent aspiration) Pharyngeal-  Regular -- Pharyngeal -- Pharyngeal- Multi-consistency -- Pharyngeal -- Pharyngeal- Pill -- Pharyngeal -- Pharyngeal Comment head turn right did not prevent aspiration of thin, tsp amounts of thin with tight chin tuck prevented aspiration of thin barium as it closed off airway before swallow, pt with both audible and silent aspiration - audible with larger amount of barium, silent with minimal amount and of secretions  CHL IP CERVICAL ESOPHAGEAL PHASE 11/30/2019 Cervical Esophageal Phase Impaired Pudding Teaspoon -- Pudding Cup -- Honey Teaspoon -- Honey Cup -- Nectar Teaspoon -- Nectar Cup -- Nectar Straw -- Thin Teaspoon -- Thin Cup -- Thin Straw -- Puree -- Mechanical Soft -- Regular -- Multi-consistency -- Pill -- Cervical Esophageal Comment -- Macario Golds 11/30/2019, 3:21 PM                   Scheduled Meds:  Chlorhexidine Gluconate Cloth  6 each Topical Q0600   enoxaparin (LOVENOX) injection  40 mg Subcutaneous Q24H   erythromycin  1 application Right Eye QHS   mouth rinse  15 mL Mouth Rinse BID   pantoprazole (PROTONIX) IV  40 mg Intravenous QHS   polyethylene glycol  17 g Oral Daily   polyvinyl alcohol  1 drop Right Eye TID   senna-docusate  1 tablet Oral BID   Continuous Infusions:  sodium chloride Stopped (12/01/19 1120)   piperacillin-tazobactam (ZOSYN)  IV 12.5 mL/hr at 12/01/19 1300     LOS: 4 days    Time spent: 25 minutes with over 50% of the time coordinating the patient's care    Harold Hedge, DO [facility] Pager [phone]  If 7PM-7AM, please contact night-coverage [email or web address] Password [facility] 12/01/2019, 2:33 PM

## 2019-12-01 NOTE — Progress Notes (Signed)
  Echocardiogram 2D Echocardiogram has been performed.  Stephen Wade 12/01/2019, 3:31 PM

## 2019-12-02 LAB — BASIC METABOLIC PANEL
Anion gap: 10 (ref 5–15)
BUN: 14 mg/dL (ref 8–23)
CO2: 22 mmol/L (ref 22–32)
Calcium: 9.6 mg/dL (ref 8.9–10.3)
Chloride: 110 mmol/L (ref 98–111)
Creatinine, Ser: 0.89 mg/dL (ref 0.61–1.24)
GFR calc Af Amer: >60 mL/min (ref >60)
GFR calc non Af Amer: >60 mL/min (ref >60)
Glucose, Bld: 77 mg/dL (ref 70–99)
Potassium: 3.8 mmol/L (ref 3.5–5.1)
Sodium: 142 mmol/L (ref 135–145)

## 2019-12-02 LAB — CBC
HCT: 37.2 % — ABNORMAL LOW (ref 39.0–52.0)
Hemoglobin: 12.3 g/dL — ABNORMAL LOW (ref 13.0–17.0)
MCH: 30.5 pg (ref 26.0–34.0)
MCHC: 33.1 g/dL (ref 30.0–36.0)
MCV: 92.3 fL (ref 80.0–100.0)
Platelets: 160 10*3/uL (ref 150–400)
RBC: 4.03 MIL/uL — ABNORMAL LOW (ref 4.22–5.81)
RDW: 15.3 % (ref 11.5–15.5)
WBC: 4.8 10*3/uL (ref 4.0–10.5)
nRBC: 0 % (ref 0.0–0.2)

## 2019-12-02 LAB — CULTURE, BLOOD (ROUTINE X 2)
Culture: NO GROWTH
Culture: NO GROWTH
Special Requests: ADEQUATE
Special Requests: ADEQUATE

## 2019-12-02 LAB — T4, FREE: Free T4: 1.15 ng/dL — ABNORMAL HIGH (ref 0.61–1.12)

## 2019-12-02 LAB — TSH: TSH: 6.555 u[IU]/mL — ABNORMAL HIGH (ref 0.350–4.500)

## 2019-12-02 LAB — MAGNESIUM: Magnesium: 1.9 mg/dL (ref 1.7–2.4)

## 2019-12-02 MED ORDER — AMOXICILLIN 250 MG PO CAPS: 500.0000 mg | ORAL_CAPSULE | Freq: Three times a day (TID) | ORAL | Status: DC

## 2019-12-02 MED ORDER — SODIUM CHLORIDE 0.9 % IV SOLN
INTRAVENOUS | Status: AC
  Administered 2019-12-02: 18:00:00 via INTRAVENOUS

## 2019-12-02 MED ORDER — PANTOPRAZOLE SODIUM 40 MG PO TBEC
40.0000 mg | DELAYED_RELEASE_TABLET | Freq: Every day | ORAL | Status: DC
  Administered 2019-12-02 – 2019-12-06 (×5): 40 mg via ORAL
  Filled 2019-12-02 (×5): qty 1

## 2019-12-02 MED ORDER — BACLOFEN 10 MG PO TABS
10.0000 mg | ORAL_TABLET | Freq: Three times a day (TID) | ORAL | Status: DC
  Administered 2019-12-02 – 2019-12-04 (×7): 10 mg via ORAL
  Filled 2019-12-02 (×7): qty 1

## 2019-12-02 MED ORDER — AMOXICILLIN-POT CLAVULANATE 875-125 MG PO TABS
1.0000 | ORAL_TABLET | Freq: Two times a day (BID) | ORAL | Status: DC
  Administered 2019-12-02 – 2019-12-07 (×10): 1 via ORAL
  Filled 2019-12-02 (×10): qty 1

## 2019-12-02 MED ORDER — SODIUM CHLORIDE 0.9 % IV SOLN
1000.0000 mg | Freq: Every day | INTRAVENOUS | Status: DC
  Administered 2019-12-02 – 2019-12-07 (×6): 1000 mg via INTRAVENOUS
  Filled 2019-12-02 (×6): qty 8

## 2019-12-02 NOTE — Progress Notes (Signed)
The patient is receiving Protonix by the intravenous route.  Based on criteria approved by the Pharmacy and [place], the medication is being converted to the equivalent oral dose form.  These criteria include: -No active GI bleeding -Able to tolerate diet of full liquids (or better) or tube feeding -Able to tolerate other medications by the oral or enteral route  If you have any questions about this conversion, please contact the Pharmacy Department (phone [phone]).  Thank you.  Minda Ditto PharmD 12/02/2019, 2:05 PM

## 2019-12-02 NOTE — Evaluation (Signed)
Physical Therapy Evaluation Patient Details Name: Stephen Wade MRN: [id number] DOB: 23-Jan-1951 Today's Date: 12/02/2019   History of Present Illness  68 year old male with past medical history of MS (not taking his medication on a wheelchair-bound at baseline and with baseline R>L weakness), GERD, recurrent UTIs and self-catheterization for neurogenic bladder and admitted for Acute hypoxic respiratory failure, secondary to multifocal pneumonia in setting of MS and A. fib with RVR and UTI  Clinical Impression  Pt admitted with above diagnosis.  Pt currently with functional limitations due to the deficits listed below (see PT Problem List). Pt will benefit from skilled PT to increase their independence and safety with mobility to allow discharge to the venue listed below.  Pt reports he typically sleeps in recliner and uses scooter for mobility (describes lateral/scoot transfer over to scooter).  Pt states an aide typically assists with ADLs however he is currently attempting finding a new one (since he states his has been diagnosed with Covid-19).  Pt very eager to have PT and wishes to perform in an outpatient setting.  Discussed other forms of post acute rehab and pt prefers OPPT.  Pt requiring total assist today for bed mobility however pt reports mostly remaining in bed during acute stay and has not been able to take Baclofen (which he states MD plans to add back to medications today).     Follow Up Recommendations Outpatient PT(pt declines CIR, wishes to do OP PT)    Equipment Recommendations  None recommended by PT    Recommendations for Other Services       Precautions / Restrictions Precautions Precautions: Fall Precaution Comments: hx MS (R LE deficits > L LE, spastic diplegia) Restrictions Weight Bearing Restrictions: No      Mobility  Bed Mobility Overal bed mobility: Needs Assistance Bed Mobility: Supine to Sit;Sit to Supine     Supine to sit: Total assist;+2 for  physical assistance Sit to supine: Total assist;+2 for physical assistance   General bed mobility comments: pt attempting to assist however unable to assist with lower body, able to assist a little with upper body  Transfers                 General transfer comment: not tested for safety  Ambulation/Gait                Stairs            Wheelchair Mobility    Modified Rankin (Stroke Patients Only)       Balance Overall balance assessment: Needs assistance Sitting-balance support: Bilateral upper extremity supported;Feet supported Sitting balance-Leahy Scale: Poor Sitting balance - Comments: requires UEs support, not able to tolerate any challenges or not utilizing UE support; pt reports core strength is typically better                                     Pertinent Vitals/Pain Pain Assessment: No/denies pain    Home Living Family/patient expects to be discharged to:: Private residence Living Arrangements: Spouse/significant other Available Help at Discharge: Family Type of Home: House Home Access: Ramped entrance     Home Layout: Able to live on main level with bedroom/bathroom Home Equipment: Transport planner;Wheelchair - Education officer, community - power;Hospital bed Additional Comments: sleeps in lazyboy lift chair    Prior Function Level of Independence: Needs assistance      ADL's / Homemaking Assistance Needed: aide typically assists with  ADLs however aide out with (+) with covid-19 currently per pt        Hand Dominance        Extremity/Trunk Assessment        Lower Extremity Assessment Lower Extremity Assessment: RLE deficits/detail;LLE deficits/detail(difficulty assessing due to spasticity, pt requiring assist, pt reports he should start Baclofen later today) RLE Deficits / Details: spasticity present with attempts to touch or assist R LE, pt unable to perform any active movement with LEs       Communication    Communication: Expressive difficulties  Cognition Arousal/Alertness: Awake/alert Behavior During Therapy: WFL for tasks assessed/performed Overall Cognitive Status: Within Functional Limits for tasks assessed                                        General Comments      Exercises     Assessment/Plan    PT Assessment Patient needs continued PT services  PT Problem List Decreased strength;Decreased mobility;Decreased activity tolerance;Decreased balance;Decreased coordination;Impaired tone       PT Treatment Interventions DME instruction;Therapeutic activities;Therapeutic exercise;Patient/family education;Functional mobility training;Stair training;Balance training;Neuromuscular re-education    PT Goals (Current goals can be found in the Care Plan section)  Acute Rehab PT Goals PT Goal Formulation: With patient Time For Goal Achievement: 12/16/19 Potential to Achieve Goals: Good    Frequency Min 3X/week   Barriers to discharge        Co-evaluation               AM-PAC PT "6 Clicks" Mobility  Outcome Measure Help needed turning from your back to your side while in a flat bed without using bedrails?: Total Help needed moving from lying on your back to sitting on the side of a flat bed without using bedrails?: Total Help needed moving to and from a bed to a chair (including a wheelchair)?: Total Help needed standing up from a chair using your arms (e.g., wheelchair or bedside chair)?: Total Help needed to walk in hospital room?: Total Help needed climbing 3-5 steps with a railing? : Total 6 Click Score: 6    End of Session   Activity Tolerance: Patient tolerated treatment well Patient left: in bed;with call bell/phone within reach;with chair alarm set;with family/visitor present(bed in chair position)   PT Visit Diagnosis: Other abnormalities of gait and mobility (R26.89)    Time: 5852-7782 PT Time Calculation (min) (ACUTE ONLY): 46  min   Charges:   PT Evaluation $PT Eval Low Complexity: 1 Low PT Treatments $Therapeutic Activity: 8-22 mins   Paulino Door, DPT Acute Rehabilitation Services Office: [phone]   Sarajane Jews 12/02/2019, 12:31 PM

## 2019-12-02 NOTE — Significant Event (Addendum)
Patient requests to change his code status to full code after extensive discussion with the patient and his wife at bedside.   Also, patient is considering CIR instead of outpatient PT now, if this is an option. Will readdress with him in the morning and readdress with PT.   Marva Panda, DO

## 2019-12-02 NOTE — Progress Notes (Addendum)
PROGRESS NOTE    Stephen Wade    Code Status: DNR  PNT:[id number] DOB: 1951-09-10 DOA: 11/27/2019  PCP: Colon Branch, MD    Hospital Summary  This is a 68 year old male with past medical history of MS (not taking his medication on a wheelchair-bound at baseline and with baseline R>L weakness), GERD, recurrent UTIs and self-catheterization for neurogenic bladder who presented to the ED via EMS for concern of worsening weakness confusion the last 1 week prior to arrival as well as increased weakness and difficulty with transfers.  Patient was noted to be drowsy in the ED after receiving Ativan prior to MRI brain.  Home health aide had tested positive for COVID-19 1 month ago, patient tested negative x2.  12/5-6: Found to be in respiratory distress with increased O2 demands, tachycardia, hypothermia, rapid response was called.  EKG showing atrial fibrillation with RVR and heart rates 120s to 140s.  He was started on IV Cardizem and required nonrebreather mask.  Bear hugger for hypothermia.  ABG: pH 7.3, PCO2 43, PO2 86, O2 96 CXR with new right lower lobe infiltrate concerning for aspiration pneumonia.  PCCM was consulted and patient was placed on phenylephrine due to hypotension (discontinued 12/7).  12/9: Transferred from stepdown to med telemetry  A & P   Principal Problem:   Acute respiratory failure with hypoxia (HCC) Active Problems:   GERD   BPH - self caths   Spastic diplegia (HCC)   Multiple sclerosis (HCC)   UTI (urinary tract infection)   Acute metabolic encephalopathy   Pneumonia   Atrial fibrillation with RVR (HCC)  Acute hypoxic respiratory failure, secondary to multifocal pneumonia in setting of MS and A. fib with RVR Sepsis resolved and off vasopressors since 12/7 Significant event 12/5 -12/6, hypothermic, in respiratory distress, tachycardia, tachypnea, but was placed on NRB, met sepsis criteria.  Patient was placed on phenylephrine due to hypotension with BP in 70s to  80s. CCM (Dr. Lamonte Sakai) was consulted, patient DNR/DNI after discussion with patient's wife. COVID-19 test negative, BNP 85, CRP 5.8, ferritin 430, D-dimer 0.78 (CTA chest negative for PE but with multifocal pneumonia).  Cortisol level normal. IV Rocephin was discontinued, patient was placed on IV vancomycin and Zosyn.  MRSA negative, narrowed to IV Zosyn.  (Day 4 antibiotics) -Failed SLP evaluation: Continue nectar thick liquid -Change Zosyn for Augmentin to treat aspiration pneumonia and GBS UTI.  Will need total 7 days antibiotics  Group B strep UTI in setting of recurrent UTIs with history of suspected neurogenic bladder from MS and self catheterizations  Present on admission Currently with Foley -Continue Zosyn  Atrial fibrillation with RVR patient was noted to be in A. fib with RVR on the night 12/5-12/6, was placed on IV Cardizem drip, turned off due to hypotension.  CHA2DS2-VASc: 1. Now in NSR -hold off on anticoagulation at this time  Acute metabolic encephalopathy due to #1, GBS UTI, and Multiple sclerosis with spastic diplegia and suspected pseudoflare MRI brain without acute changes.  Has been off his baclofen due to encephalopathy which has since resolved. -Restart baclofen  Pressure injury Stage I with nonblanchable redness with small blistered areas to the mid bilateral buttocks, present on admission Wound care per nursing  Goals of care in the setting of worsening MS, multifocal pneumonia, respiratory failure, aspiration -Palliative medicine recently consulted for GOC  Right eye viral conjunctivitis -Eyedrops and routine eye care  Hypoglycemia resolved -Discontinued D5W  Hypernatremia resolved with D5W -Discontinue fluids  Neurogenic bladder  secondary to MS self catheterizes 5+ times per day.  Has Foley currently -At discharge, patient can continue Foley and follow-up with urology outpatient to discuss suprapubic catheter  Multiple sclerosis likely in  pseudoflare given worsening symptoms, unchanged MRI brain and acute infection as above. Follows with Dr. Felecia Shelling, neurology, outpatient.  Not taking his ocrelizumab -Give IV steroids as he has not had much improvement with treatment of his pseudoflare with antibiotics.  If ready for DC tomorrow, can discharge with p.o. prednisone -Patient refused CIR, will need outpatient PT at discharge -outpatient follow up with MS neurology -Restart baclofen   DVT prophylaxis: Lovenox Diet: Nectar thick liquid Family Communication: Patient's wife has been updated over the phone  Disposition Plan: Likely DC tomorrow.  Receiving IV steroids today and restarting his baclofen  Consultants  Palliative care PCCM  Procedures  None  Antibiotics   Anti-infectives (From admission, onward)   Start     Dose/Rate Route Frequency Ordered Stop   11/28/19 2300  vancomycin (VANCOCIN) IVPB 750 mg/150 ml premix  Status:  Discontinued     750 mg 150 mL/hr over 60 Minutes Intravenous Every 12 hours 11/28/19 0954 11/28/19 1041   11/28/19 1800  piperacillin-tazobactam (ZOSYN) IVPB 3.375 g     3.375 g 12.5 mL/hr over 240 Minutes Intravenous Every 8 hours 11/28/19 1122     11/28/19 1400  cefTRIAXone (ROCEPHIN) 1 g in sodium chloride 0.9 % 100 mL IVPB  Status:  Discontinued     1 g 200 mL/hr over 30 Minutes Intravenous Every 24 hours 11/27/19 2133 11/28/19 0914   11/28/19 1100  vancomycin (VANCOCIN) 2,000 mg in sodium chloride 0.9 % 500 mL IVPB  Status:  Discontinued     2,000 mg 250 mL/hr over 120 Minutes Intravenous  Once 11/28/19 0947 11/28/19 1041   11/28/19 1000  ceFEPIme (MAXIPIME) 2 g in sodium chloride 0.9 % 100 mL IVPB  Status:  Discontinued     2 g 200 mL/hr over 30 Minutes Intravenous Every 8 hours 11/28/19 0946 11/28/19 1041   11/27/19 1415  cefTRIAXone (ROCEPHIN) 1 g in sodium chloride 0.9 % 100 mL IVPB     1 g 200 mL/hr over 30 Minutes Intravenous  Once 11/27/19 1406 11/27/19 1520            Subjective   Patient seen and examined at bedside no acute distress resting comfortably.  States that he is having some muscle stiffness and pain and is requesting that he restart his baclofen today.  He is refusing going to a rehab facility.  He states he is still having weakness worse than his typical baseline despite treatment for his infection.  Otherwise denies any other complaints  Objective   Vitals:   12/01/19 2014 12/02/19 0459 12/02/19 0900 12/02/19 1256  BP: (!) 154/92 134/74 137/74 136/72  Pulse: 68 71 72 70  Resp: 20 20 18 20   Temp: 98.1 F (36.7 C) 98 F (36.7 C) 98.2 F (36.8 C) 99 F (37.2 C)  TempSrc:   Oral Oral  SpO2: 93% 91% 94% 97%  Weight:        Intake/Output Summary (Last 24 hours) at 12/02/2019 1320 Last data filed at 12/02/2019 0500 Gross per 24 hour  Intake 105.81 ml  Output 575 ml  Net -469.19 ml   Filed Weights   11/28/19 0900 11/29/19 0415 12/01/19 0500  Weight: 96.3 kg 97.8 kg 95.9 kg    Examination:  Physical Exam Vitals and nursing note reviewed.  Constitutional:  General: He is not in acute distress. HENT:     Head: Normocephalic and atraumatic.     Nose: Nose normal.     Mouth/Throat:     Mouth: Mucous membranes are moist.  Eyes:     Extraocular Movements: Extraocular movements intact.  Cardiovascular:     Rate and Rhythm: Normal rate and regular rhythm.  Pulmonary:     Effort: Pulmonary effort is normal.     Breath sounds: Normal breath sounds.  Abdominal:     General: Abdomen is flat.     Palpations: Abdomen is soft.  Musculoskeletal:        General: No swelling.     Cervical back: Normal range of motion. No rigidity.     Comments: Bilateral lower extremity muscle spasticity  Neurological:     Mental Status: He is alert and oriented to person, place, and time.     Comments: Dysarthric with some right-sided facial droop  Psychiatric:        Mood and Affect: Mood normal.        Behavior: Behavior normal.      Data Reviewed: I have personally reviewed following labs and imaging studies  CBC: Recent Labs  Lab 11/27/19 0651 11/28/19 0253 11/29/19 0203 11/30/19 0226 12/01/19 0211 12/02/19 0536  WBC 5.1 10.7* 13.1* 6.8 5.4 4.8  NEUTROABS 3.6  --   --   --   --   --   HGB 13.8 14.6 11.4* 11.0* 11.4* 12.3*  HCT 42.1 45.1 35.3* 34.3* 35.3* 37.2*  MCV 94.0 94.2 97.0 96.3 95.1 92.3  PLT 128* 120* 128* PLATELET CLUMPS NOTED ON SMEAR, COUNT APPEARS DECREASED 127* 102   Basic Metabolic Panel: Recent Labs  Lab 11/27/19 0743 11/28/19 0253 11/29/19 0203 11/30/19 0226 12/01/19 0211 12/02/19 0536  NA 146* 148* 146* 142 140 142  K 4.9 4.7 4.1 3.9 3.7 3.8  CL 110 113* 117* 113* 110 110  CO2 27 23 22 22  21* 22  GLUCOSE 79 66* 87 82 100* 77  BUN 32* 35* 27* 19 14 14   CREATININE 0.90 1.17 1.24 1.10 1.00 0.89  CALCIUM 11.4* 10.2 9.2 9.3 9.3 9.6  MG 1.8  --   --   --   --  1.9  PHOS 2.9  --   --   --   --   --    GFR: CrCl cannot be calculated (Unknown ideal weight.). Liver Function Tests: Recent Labs  Lab 11/27/19 0743 11/28/19 0618 11/29/19 0203  AST 78* 57* 47*  ALT 114* 89* 72*  ALKPHOS 111 75 78  BILITOT 0.4 1.1 0.9  PROT 7.2 6.0* 5.7*  ALBUMIN 3.9 3.3* 2.8*   No results for input(s): LIPASE, AMYLASE in the last 168 hours. No results for input(s): AMMONIA in the last 168 hours. Coagulation Profile: No results for input(s): INR, PROTIME in the last 168 hours. Cardiac Enzymes: No results for input(s): CKTOTAL, CKMB, CKMBINDEX, TROPONINI in the last 168 hours. BNP (last 3 results) No results for input(s): PROBNP in the last 8760 hours. HbA1C: No results for input(s): HGBA1C in the last 72 hours. CBG: Recent Labs  Lab 12/01/19 0004 12/01/19 0409 12/01/19 0746 12/01/19 1251 12/01/19 1608  GLUCAP 112* 88 93 74 118*   Lipid Profile: No results for input(s): CHOL, HDL, LDLCALC, TRIG, CHOLHDL, LDLDIRECT in the last 72 hours. Thyroid Function Tests: Recent Labs     12/02/19 0536  TSH 6.555*  FREET4 1.15*   Anemia Panel: No results for input(s):  VITAMINB12, FOLATE, FERRITIN, TIBC, IRON, RETICCTPCT in the last 72 hours. Sepsis Labs: Recent Labs  Lab 11/27/19 1643 11/28/19 0618  PROCALCITON  --  7.64  LATICACIDVEN 0.8  --     Recent Results (from the past 240 hour(s))  SARS CORONAVIRUS 2 (TAT 6-24 HRS) Nasopharyngeal Nasopharyngeal Swab     Status: None   Collection Time: 11/27/19  7:51 AM   Specimen: Nasopharyngeal Swab  Result Value Ref Range Status   SARS Coronavirus 2 NEGATIVE NEGATIVE Final    Comment: (NOTE) SARS-CoV-2 target nucleic acids are NOT DETECTED. The SARS-CoV-2 RNA is generally detectable in upper and lower respiratory specimens during the acute phase of infection. Negative results do not preclude SARS-CoV-2 infection, do not rule out co-infections with other pathogens, and should not be used as the sole basis for treatment or other patient management decisions. Negative results must be combined with clinical observations, patient history, and epidemiological information. The expected result is Negative. Fact Sheet for Patients: SugarRoll.be Fact Sheet for Healthcare Providers: https://www.woods-mathews.com/ This test is not yet approved or cleared by the [place] FDA and  has been authorized for detection and/or diagnosis of SARS-CoV-2 by FDA under an Emergency Use Authorization (EUA). This EUA will remain  in effect (meaning this test can be used) for the duration of the COVID-19 declaration under Section 56 4(b)(1) of the Act, 21 U.S.C. section 360bbb-3(b)(1), unless the authorization is terminated or revoked sooner. Performed at [facility], [place] [address], [place], [place] [address]   Urine culture     Status: Abnormal   Collection Time: 11/27/19  9:49 AM   Specimen: Urine, Clean Catch  Result Value Ref Range Status   Specimen Description   Final    URINE,  CLEAN CATCH Performed at [facility], [place] [address] [place], [place] [address]    Special Requests   Final    NONE Performed at [facility], [place] [address], [place], [place] [address]    Culture (A)  Final    >=100,000 COLONIES/mL GROUP B STREP(S.AGALACTIAE)ISOLATED TESTING AGAINST S. AGALACTIAE NOT ROUTINELY PERFORMED DUE TO PREDICTABILITY OF AMP/PEN/VAN SUSCEPTIBILITY. Performed at [facility], [place] [address], [place], [place] [address]    Report Status 11/28/2019 FINAL  Final  Culture, blood (routine x 2)     Status: None   Collection Time: 11/27/19  6:17 PM   Specimen: BLOOD  Result Value Ref Range Status   Specimen Description   Final    BLOOD RIGHT ANTECUBITAL Performed at [place] [address], [place], [place] [address]    Special Requests   Final    BOTTLES DRAWN AEROBIC AND ANAEROBIC Blood Culture adequate volume Performed at [place] [address] [place], [place] [address]    Culture   Final    NO GROWTH 5 DAYS Performed at [facility], [place] [address], [place], [place] [address]    Report Status 12/02/2019 FINAL  Final  Culture, blood (routine x 2)     Status: None   Collection Time: 11/27/19  6:17 PM   Specimen: BLOOD  Result Value Ref Range Status   Specimen Description   Final    BLOOD RIGHT HAND Performed at [place] [address] [place], [place] [address]    Special Requests   Final    BOTTLES DRAWN AEROBIC AND ANAEROBIC Blood Culture adequate volume Performed at [facility], [address] [place], [place] [address]    Culture   Final    NO GROWTH 5 DAYS Performed at [facility], [place] [address], [place], [place] [address]    Report Status 12/02/2019 FINAL  Final  MRSA PCR Screening     Status: None   Collection Time: 11/27/19 11:25 PM   Specimen: Nasal Mucosa; Nasopharyngeal  Result Value  Ref Range Status   MRSA by PCR NEGATIVE NEGATIVE Final    Comment:        The GeneXpert MRSA Assay (FDA approved for NASAL specimens only), is one component of a comprehensive MRSA colonization surveillance program. It is not intended to diagnose MRSA infection nor to guide or monitor treatment for MRSA infections. Performed at [facility], [place] [address], [place], [place] [address]          Radiology Studies: DG Swallowing Func-Speech Pathology  Result Date: 11/30/2019 Objective Swallowing Evaluation: Type of Study: MBS-Modified Barium Swallow Study  Patient Details Name: HURMAN KETELSEN MRN: [id number] Date of Birth: June 04, 1951 Today's Date: 11/30/2019 Time: SLP Start Time (ACUTE ONLY): 1430 -SLP Stop Time (ACUTE ONLY): 1459 SLP Time Calculation (min) (ACUTE ONLY): 29 min Past Medical History: Past Medical History: Diagnosis Date . GERD (gastroesophageal reflux disease)   had on- off chest pain, symptoms resolved with Prilosec 11/2010 . Lower urinary tract symptoms (LUTS)   Dr Gaynelle Arabian . MS (multiple sclerosis) ([place]) 2005 . Testicular hypogonadism  . Urge incontinence of urine  . Urolithiasis 2011 Past Surgical History: Past Surgical History: Procedure Laterality Date . APPENDECTOMY     ruptured 03-2010 . VASECTOMY   HPI: 68 yo male adm tto [facility] with sepsis - recurrent UTIs for which he self caths with AMS = Pt w/ MS, GERD,  = after receiving Ativan he demonstrated decreased mentation - 12/5-12/6 pt became tachycardia ? Afib, hypotensive  and was transferred to SDU.  Is now on oxygen and in ICU.  CXR showed possible progressive RLL pna.  Pt MRI showed demyelinating process which is not significantly worse compared to 2018 MRI.  He demonstrates poor airway protection of secretions today with gross dysarthria.  Pt reports he has h/o poor intake recently with increased difficulties swallowing compared to management of secretions.  Also admits to taste changes.  Swallow eval  ordered.  Subjective: pt awake in chair Assessment / Plan / Recommendation CHL IP CLINICAL IMPRESSIONS 11/30/2019 Clinical Impression Patient presents with oropharyngeal dysphagia characterizeed by sensorimotor deficits.  Overall fortunately his swallow is strong without severe retention.   Premature spillage of boluses into pharynx with decreased timing/adequacy of laryngeal closure allowed aspiration of thin via cup with cough response - but not effective to lear.  Controling bolus to tsp amounts of thin with tight chin tuck posture were not aspirated.  At times, pharyngeal swallow is delayed with cracker bolus mixed with secretions pooling in pyriform sinus prior to swallow.  Chronic aspiration of secretions mixed with barium observed t/o tesing.  SlP provided frequent oral suctioning to decrease secretion and barium aspiration.  Pt's cued nor reflexive cough is adequate to clear trachea/larynx and "hock" did not expel bolus from pharynx.  Consumption of intake is laborious for this pt and adequacy of nutrition is concerning with current level of dysphagia.  Given goal is for pt's swallow to get better *per his statement on 11/29/2019, recommend to initiate diet of dys1/nectar or full liquid/nectar thick with strict precautions/compensation strategies.  Advise pt maximize liquid nutrition *eg  Ensures.  Thin WATER via tsp with tight chin tuck between meals recommended.  Will need to monitor pt's vitals, etc closely due to his silent nature of dysphagia.  From interview with pt and family, pt with chronic dysphagia that has progressed over the last few months and currently is severely exacerbated with current medical condition. Recommend to address his dysphagia within his MS due to recent progression and his long term goals.  Educated pt/wife to results of testing.  SLP will follow up.  Thanks. SLP Visit Diagnosis Dysphagia, oropharyngeal phase (R13.12) Attention and concentration deficit following -- Frontal lobe  and executive function deficit following -- Impact on safety and function --   CHL IP TREATMENT RECOMMENDATION 11/30/2019 Treatment Recommendations Therapy as outlined in treatment plan below   Prognosis 11/30/2019 Prognosis for Safe Diet Advancement Fair Barriers to Reach Goals Severity of deficits;Time post onset Barriers/Prognosis Comment -- CHL IP DIET RECOMMENDATION 11/30/2019 SLP Diet Recommendations Dysphagia 1 (Puree) solids;Nectar thick liquid Liquid Administration via Cup;Straw;Spoon Medication Administration (No Data) Compensations Slow rate;Small sips/bites;Effortful swallow Postural Changes Seated upright at 90 degrees;Remain semi-upright after after feeds/meals (Comment)   CHL IP OTHER RECOMMENDATIONS 11/30/2019 Recommended Consults -- Oral Care Recommendations Oral care BID Other Recommendations Order thickener from pharmacy;Have oral suction available   CHL IP FOLLOW UP RECOMMENDATIONS 11/30/2019 Follow up Recommendations Skilled Nursing facility   [facility] IP FREQUENCY AND DURATION 11/30/2019 Speech Therapy Frequency (ACUTE ONLY) min 2x/week Treatment Duration 2 weeks      CHL IP ORAL PHASE 11/30/2019 Oral Phase Impaired Oral - Pudding Teaspoon -- Oral - Pudding Cup -- Oral - Honey Teaspoon Delayed oral transit;Weak lingual manipulation;Decreased bolus cohesion;Premature spillage;Reduced posterior propulsion Oral - Honey Cup -- Oral - Nectar Teaspoon Delayed oral transit;Weak lingual manipulation;Decreased bolus cohesion;Premature spillage;Reduced posterior propulsion;Right anterior bolus loss Oral - Nectar Cup Reduced posterior propulsion;Premature spillage;Decreased bolus cohesion;Weak lingual manipulation;Delayed oral transit;Right anterior bolus loss Oral - Nectar Straw -- Oral - Thin Teaspoon Right anterior bolus loss;Weak lingual manipulation;Reduced posterior propulsion;Delayed oral transit;Decreased bolus cohesion;Lingual pumping Oral - Thin Cup Premature spillage;Decreased bolus cohesion;Delayed oral  transit;Reduced posterior propulsion;Weak lingual manipulation;Lingual pumping Oral - Thin Straw -- Oral - Puree Premature spillage;Reduced posterior propulsion;Delayed oral transit;Lingual/palatal residue;Weak lingual manipulation;Lingual pumping Oral - Mech Soft Premature spillage;Reduced posterior propulsion;Delayed oral transit;Lingual/palatal residue;Lingual pumping;Weak lingual manipulation;Impaired mastication Oral - Regular -- Oral - Multi-Consistency -- Oral - Pill -- Oral Phase - Comment --  CHL IP PHARYNGEAL PHASE 11/30/2019 Pharyngeal Phase Impaired Pharyngeal- Pudding Teaspoon -- Pharyngeal -- Pharyngeal- Pudding Cup -- Pharyngeal -- Pharyngeal- Honey Teaspoon Delayed swallow initiation-pyriform sinuses;Pharyngeal residue - valleculae Pharyngeal Material does not enter airway Pharyngeal- Honey Cup -- Pharyngeal -- Pharyngeal- Nectar Teaspoon Delayed swallow initiation-pyriform sinuses Pharyngeal Material does not enter airway Pharyngeal- Nectar Cup Delayed swallow initiation-pyriform sinuses;Penetration/Aspiration during swallow Pharyngeal Material enters airway, remains ABOVE vocal cords then ejected out Pharyngeal- Nectar Straw -- Pharyngeal -- Pharyngeal- Thin Teaspoon Delayed swallow initiation-pyriform sinuses;Moderate aspiration;Penetration/Aspiration during swallow Pharyngeal Material enters airway, passes BELOW cords and not ejected out despite cough attempt by patient Pharyngeal- Thin Cup Penetration/Aspiration during swallow;Delayed swallow initiation-pyriform sinuses Pharyngeal Material enters airway, passes BELOW cords without attempt by patient to eject out (silent aspiration);Material enters airway, passes BELOW cords and not ejected out despite cough attempt by patient Pharyngeal- Thin Straw -- Pharyngeal -- Pharyngeal- Puree Delayed swallow initiation-vallecula Pharyngeal Material does not enter airway Pharyngeal- Mechanical Soft Delayed swallow initiation-pyriform  sinuses;Penetration/Apiration after swallow Pharyngeal Material enters airway, passes BELOW cords without attempt by patient  to eject out (silent aspiration) Pharyngeal- Regular -- Pharyngeal -- Pharyngeal- Multi-consistency -- Pharyngeal -- Pharyngeal- Pill -- Pharyngeal -- Pharyngeal Comment head turn right did not prevent aspiration of thin, tsp amounts of thin with tight chin tuck prevented aspiration of thin barium as it closed off airway before swallow, pt with both audible and silent aspiration - audible with larger amount of barium, silent with minimal amount and of secretions  CHL IP CERVICAL ESOPHAGEAL PHASE 11/30/2019 Cervical Esophageal Phase Impaired Pudding Teaspoon -- Pudding Cup -- Honey Teaspoon -- Honey Cup -- Nectar Teaspoon -- Nectar Cup -- Nectar Straw -- Thin Teaspoon -- Thin Cup -- Thin Straw -- Puree -- Mechanical Soft -- Regular -- Multi-consistency -- Pill -- Cervical Esophageal Comment -- Macario Golds 11/30/2019, 3:21 PM              ECHOCARDIOGRAM COMPLETE  Result Date: 12/01/2019   ECHOCARDIOGRAM REPORT   Patient Name:   LAURO MANLOVE Date of Exam: 12/01/2019 Medical Rec #:  [id number]     Height:       72.0 in Accession #:    [id number]    Weight:       211.4 lb Date of Birth:  [birth year]     BSA:          2.18 m Patient Age:    84 years      BP:           137/96 mmHg Patient Gender: M             HR:           77 bpm. Exam Location:  Inpatient Procedure: 2D Echo, Cardiac Doppler and Color Doppler Indications:    Atrial fibrillation  History:        Patient has no prior history of Echocardiogram examinations.                 Arrythmias:Atrial Fibrillation. Multiple sclerosis, wheelchair                 bound, Pneumonia, resp. distress.  Sonographer:    Dustin Flock Referring Phys: [id number] Gracy Ehly E Alonnie Bieker  Sonographer Comments: Incapable of moving. IMPRESSIONS  1. Left ventricular ejection fraction, by visual estimation, is 55 to 60%. The left ventricle has normal function.  There is no left ventricular hypertrophy.  2. The left ventricle has no regional wall motion abnormalities.  3. Global right ventricle has normal systolic function.The right ventricular size is normal. No increase in right ventricular wall thickness.  4. Left atrial size was normal.  5. Right atrial size was normal.  6. Presence of pericardial fat pad.  7. The mitral valve is grossly normal. No evidence of mitral valve regurgitation.  8. The tricuspid valve is grossly normal. Tricuspid valve regurgitation is trivial.  9. The aortic valve is grossly normal. Aortic valve regurgitation is not visualized. No evidence of aortic valve sclerosis or stenosis. 10. The pulmonic valve was grossly normal. Pulmonic valve regurgitation is not visualized. 11. The inferior vena cava is normal in size with greater than 50% respiratory variability, suggesting right atrial pressure of 3 mmHg. FINDINGS  Left Ventricle: Left ventricular ejection fraction, by visual estimation, is 55 to 60%. The left ventricle has normal function. The left ventricle has no regional wall motion abnormalities. The left ventricular internal cavity size was the left ventricle is normal in size. There is no left ventricular hypertrophy. Right Ventricle: The right ventricular size is normal. No increase in  right ventricular wall thickness. Global RV systolic function is has normal systolic function. Left Atrium: Left atrial size was normal in size. Right Atrium: Right atrial size was normal in size Pericardium: There is no evidence of pericardial effusion. Presence of pericardial fat pad. Mitral Valve: The mitral valve is grossly normal. No evidence of mitral valve regurgitation. Tricuspid Valve: The tricuspid valve is grossly normal. Tricuspid valve regurgitation is trivial. Aortic Valve: The aortic valve is grossly normal. Aortic valve regurgitation is not visualized. The aortic valve is structurally normal, with no evidence of sclerosis or stenosis.  Pulmonic Valve: The pulmonic valve was grossly normal. Pulmonic valve regurgitation is not visualized. Pulmonic regurgitation is not visualized. Aorta: The aortic root is normal in size and structure. Venous: The inferior vena cava is normal in size with greater than 50% respiratory variability, suggesting right atrial pressure of 3 mmHg. IAS/Shunts: No atrial level shunt detected by color flow Doppler.  LEFT VENTRICLE PLAX 2D LVIDd:         5.20 cm  Diastology LVIDs:         3.40 cm  LV e' lateral:   5.44 cm/s LV PW:         1.00 cm  LV E/e' lateral: 10.4 LV IVS:        0.90 cm  LV e' medial:    7.18 cm/s LVOT diam:     2.20 cm  LV E/e' medial:  7.9 LV SV:         82 ml LV SV Index:   36.87 LVOT Area:     3.80 cm  RIGHT VENTRICLE RV Basal diam:  3.10 cm RV S prime:     7.40 cm/s TAPSE (M-mode): 2.8 cm LEFT ATRIUM             Index       RIGHT ATRIUM           Index LA diam:        3.30 cm 1.51 cm/m  RA Area:     10.50 cm LA Vol (A2C):   41.3 ml 18.93 ml/m RA Volume:   20.10 ml  9.21 ml/m LA Vol (A4C):   61.5 ml 28.19 ml/m LA Biplane Vol: 54.1 ml 24.80 ml/m  AORTIC VALVE LVOT Vmax:   68.50 cm/s LVOT Vmean:  47.400 cm/s LVOT VTI:    0.166 m  AORTA Ao Root diam: 3.00 cm MITRAL VALVE MV Area (PHT): 5.13 cm             SHUNTS MV PHT:        42.92 msec           Systemic VTI:  0.17 m MV Decel Time: 148 msec             Systemic Diam: 2.20 cm MV E velocity: 56.75 cm/s 103 cm/s MV A velocity: 67.30 cm/s 70.3 cm/s MV E/A ratio:  0.84       1.5  Eleonore Chiquito MD Electronically signed by Eleonore Chiquito MD Signature Date/Time: 12/01/2019/5:34:04 PM    Final         Scheduled Meds: . baclofen  10 mg Oral TID  . chlorhexidine  15 mL Mouth Rinse BID  . Chlorhexidine Gluconate Cloth  6 each Topical Q0600  . enoxaparin (LOVENOX) injection  40 mg Subcutaneous Q24H  . erythromycin  1 application Right Eye QHS  . mouth rinse  15 mL Mouth Rinse q12n4p  . pantoprazole (PROTONIX) IV  40 mg Intravenous QHS  .  polyethylene glycol  17 g Oral Daily  . polyvinyl alcohol  1 drop Right Eye TID  . senna-docusate  1 tablet Oral BID   Continuous Infusions: . sodium chloride 10 mL/hr at 12/01/19 1800  . piperacillin-tazobactam (ZOSYN)  IV 3.375 g (12/02/19 0300)     LOS: 5 days    Time spent: 25 minutes with over 50% of the time coordinating the patient's care    Harold Hedge, DO [facility] Pager [phone]  If 7PM-7AM, please contact night-coverage [email or web address] Password [email or web address] 12/02/2019, 1:20 PM

## 2019-12-02 NOTE — Care Management Important Message (Signed)
Important Message  Patient Details IM Letter given to Dessa Phi RN Case Manager to present to the Patient Name: Stephen Wade MRN: [id number] Date of Birth: [birth year]   Medicare Important Message Given:  Yes     Kerin Salen 12/02/2019, 3:33 PM

## 2019-12-03 ENCOUNTER — Other Ambulatory Visit: Payer: Self-pay

## 2019-12-03 DIAGNOSIS — R262 Difficulty in walking, not elsewhere classified: Secondary | ICD-10-CM

## 2019-12-03 LAB — CBC
HCT: 38.7 % — ABNORMAL LOW (ref 39.0–52.0)
Hemoglobin: 12.9 g/dL — ABNORMAL LOW (ref 13.0–17.0)
MCH: 30.9 pg (ref 26.0–34.0)
MCHC: 33.3 g/dL (ref 30.0–36.0)
MCV: 92.6 fL (ref 80.0–100.0)
Platelets: 157 10*3/uL (ref 150–400)
RBC: 4.18 MIL/uL — ABNORMAL LOW (ref 4.22–5.81)
RDW: 15.5 % (ref 11.5–15.5)
WBC: 3.3 10*3/uL — ABNORMAL LOW (ref 4.0–10.5)
nRBC: 0.6 % — ABNORMAL HIGH (ref 0.0–0.2)

## 2019-12-03 LAB — BASIC METABOLIC PANEL
Anion gap: 10 (ref 5–15)
BUN: 14 mg/dL (ref 8–23)
CO2: 20 mmol/L — ABNORMAL LOW (ref 22–32)
Calcium: 9.5 mg/dL (ref 8.9–10.3)
Chloride: 111 mmol/L (ref 98–111)
Creatinine, Ser: 0.71 mg/dL (ref 0.61–1.24)
GFR calc Af Amer: >60 mL/min (ref >60)
GFR calc non Af Amer: >60 mL/min (ref >60)
Glucose, Bld: 124 mg/dL — ABNORMAL HIGH (ref 70–99)
Potassium: 4.1 mmol/L (ref 3.5–5.1)
Sodium: 141 mmol/L (ref 135–145)

## 2019-12-03 NOTE — Progress Notes (Signed)
Physical Therapy Treatment Patient Details Name: Stephen Wade MRN: [id number] DOB: 1951-12-11 Today's Date: 12/03/2019    History of Present Illness 68 year old male with past medical history of MS (not taking his medication on a wheelchair-bound at baseline and with baseline R>L weakness), GERD, recurrent UTIs and self-catheterization for neurogenic bladder and admitted for Acute hypoxic respiratory failure, secondary to multifocal pneumonia in setting of MS and A. fib with RVR and UTI    PT Comments    Pt agreeable to PT with encouragement. further discussion regarding d/c plan and options. At baseline pt is able to "scoot" on to his scooter from his chair, with some assist at least some of the time--per wife  (pt states he does it by himself 99% of time).  Pt sat EOB this am, working on balance, core strength, A-P tilts and trunk mobility/extension. Pt progressing toward PT goals. He states is much weaker than his baseline.  Would benefit from CIR. Will continue to follow in acute setting.   Follow Up Recommendations  CIR  If unable to do CIR, recommend HHPT/HHOT     Equipment Recommendations  None recommended by PT;Other (comment)(if home Hoyer lift )    Recommendations for Other Services       Precautions / Restrictions Precautions Precautions: Fall Precaution Comments: hx MS (R LE deficits > L LE, spastic diplegia) Restrictions Weight Bearing Restrictions: No    Mobility  Bed Mobility Overal bed mobility: Needs Assistance Bed Mobility: Supine to Sit;Sit to Supine     Supine to sit: Max assist;+2 for physical assistance;HOB elevated Sit to supine: Total assist;+2 for physical assistance   General bed mobility comments: pt sleeps in reclienr at baseline therefore elevated HOB. Pt requiring assist for bil LEs off bed and trunk to upright, pt attmepting to self assist with UEs  Transfers                    Ambulation/Gait                 Stairs             Wheelchair Mobility    Modified Rankin (Stroke Patients Only)       Balance   Sitting-balance support: Bilateral upper extremity supported;Single extremity supported;Feet supported Sitting balance-Leahy Scale: Poor Sitting balance - Comments: sate EOB x 15 minutes, reliant on UEs and external support from PT and tech to maintain midline. repeated posterior LOB                                     Cognition Arousal/Alertness: Awake/alert Behavior During Therapy: WFL for tasks assessed/performed Overall Cognitive Status: Within Functional Limits for tasks assessed                                        Exercises      General Comments        Pertinent Vitals/Pain Pain Assessment: No/denies pain    Home Living                      Prior Function            PT Goals (current goals can now be found in the care plan section) Acute Rehab PT Goals PT Goal Formulation: With patient Time For Goal  Achievement: 12/16/19 Potential to Achieve Goals: Good Progress towards PT goals: Progressing toward goals    Frequency    Min 3X/week      PT Plan Frequency needs to be updated    Co-evaluation              AM-PAC PT "6 Clicks" Mobility   Outcome Measure  Help needed turning from your back to your side while in a flat bed without using bedrails?: Total Help needed moving from lying on your back to sitting on the side of a flat bed without using bedrails?: Total Help needed moving to and from a bed to a chair (including a wheelchair)?: Total Help needed standing up from a chair using your arms (e.g., wheelchair or bedside chair)?: Total Help needed to walk in hospital room?: Total Help needed climbing 3-5 steps with a railing? : Total 6 Click Score: 6    End of Session   Activity Tolerance: Patient tolerated treatment well Patient left: in bed;with call bell/phone within reach;with bed alarm set;with  family/visitor present Nurse Communication: Mobility status PT Visit Diagnosis: Other abnormalities of gait and mobility (R26.89)     Time: 3154-0086 PT Time Calculation (min) (ACUTE ONLY): 33 min  Charges:  $Therapeutic Activity: 23-37 mins                     Drucilla Chalet, PT  Pager:  Acute Rehab Dept [facility]): [phone]   12/03/2019    [facility] 12/03/2019, 2:02 PM

## 2019-12-03 NOTE — TOC Initial Note (Signed)
Transition of Care [facility]) - Initial/Assessment Note    Patient Details  Name: Stephen Wade MRN: [id number] Date of Birth: 1951-01-23  Transition of Care [facility]) CM/SW Contact:    Lanier Clam, RN Phone Number: 12/03/2019, 12:22 PM  Clinical Narrative: [facility] already following-HHRN/PT/OT/aide, [facility] rep [facility] also following. Noted may need hoyer lift-await PT to re eval per MD note.currently full code. Will need PTAR @ d/c.                   Expected Discharge Plan: Home w Home Health Services Barriers to Discharge: Continued Medical Work up   Patient Goals and CMS Choice        Expected Discharge Plan and Services Expected Discharge Plan: Home w Home Health Services   Discharge Planning Services: CM Consult   Living arrangements for the past 2 months: Single Family Home                                      Prior Living Arrangements/Services Living arrangements for the past 2 months: Single Family Home Lives with:: Spouse   Do you feel safe going back to the place where you live?: Yes      Need for Family Participation in Patient Care: No (Comment) Care giver support system in place?: Yes (comment)   Criminal Activity/Legal Involvement Pertinent to Current Situation/Hospitalization: No - Comment as needed  Activities of Daily Living Home Assistive Devices/Equipment: Other (Comment), Built-in shower seat, Eyeglasses, Hospital bed, Grab bars around toilet, Grab bars in shower, Hand-held shower hose(manual wheelchair, electric wheelchair, ramp entrance, walk-in shower, lift chair, stair lift, catheter supplies, generator, handicap van) ADL Screening (condition at time of admission) Patient's cognitive ability adequate to safely complete daily activities?: Yes Is the patient deaf or have difficulty hearing?: No Does the patient have difficulty seeing, even when wearing glasses/contacts?: No Does the patient have difficulty  concentrating, remembering, or making decisions?: No Patient able to express need for assistance with ADLs?: Yes Does the patient have difficulty dressing or bathing?: Yes Independently performs ADLs?: No Communication: Independent Dressing (OT): Needs assistance Is this a change from baseline?: Pre-admission baseline Grooming: Needs assistance Is this a change from baseline?: Pre-admission baseline Feeding: Needs assistance Is this a change from baseline?: Pre-admission baseline Bathing: Needs assistance Is this a change from baseline?: Pre-admission baseline Toileting: Needs assistance Is this a change from baseline?: Pre-admission baseline In/Out Bed: Needs assistance Is this a change from baseline?: Pre-admission baseline Walks in Home: Needs assistance Is this a change from baseline?: Pre-admission baseline Does the patient have difficulty walking or climbing stairs?: Yes Weakness of Legs: Both Weakness of Arms/Hands: Both  Permission Sought/Granted Permission sought to share information with : Case Manager Permission granted to share information with : Yes, Verbal Permission Granted              Emotional Assessment Appearance:: Appears stated age Attitude/Demeanor/Rapport: Gracious Affect (typically observed): Accepting Orientation: : Oriented to Self, Oriented to Place, Oriented to  Time, Oriented to Situation Alcohol / Substance Use: Not Applicable Psych Involvement: No (comment)  Admission diagnosis:  Tremor [R25.1] MS (multiple sclerosis) (HCC) [G35] Acute cystitis without hematuria [N30.00] Generalized weakness [R53.1] Right sided weakness [R53.1] Acute metabolic encephalopathy [G93.41] Patient Active Problem List   Diagnosis Date Noted  . Acute respiratory failure with hypoxia (HCC) 11/28/2019  . Pneumonia 11/28/2019  .  Atrial fibrillation with RVR ([place]) 11/28/2019  . UTI (urinary tract infection) 11/27/2019  . Acute metabolic encephalopathy 04/59/9774   . Urinary retention 06/22/2019  . PCP NOTES >>>>>>> 05/24/2018  . Depression, recurrent ([place]) 05/24/2018  . History of recurrent UTIs 05/22/2018  . Pressure injury of skin 11/06/2017  . Multiple sclerosis ([place]) 11/04/2017  . Skin ulcer of sacral region ([place]) 05/26/2017  . Vitamin D deficiency 01/16/2016  . Spastic gait 02/10/2015  . Spastic diplegia ([place]) 02/10/2015  . Other fatigue 02/10/2015  . Disturbed cognition 02/10/2015  . Edema 05/12/2014  . Hypogonadism male 03/11/2013  . BPH - self caths 12/26/2011  . GERD 11/30/2010  . URINARY CALCULUS 10/05/2010   PCP:  Colon Branch, MD Pharmacy:   [facility] [phone] [address], [address] AT [facility] [address] [place] [address] Phone: [phone] Fax: [phone]     Social Determinants of Health ([place]) Interventions    Readmission Risk Interventions No flowsheet data found.

## 2019-12-03 NOTE — Progress Notes (Signed)
PROGRESS NOTE    Stephen Wade    Code Status: Full Code  VVO:[id number] DOB: July 10, 1951 DOA: 11/27/2019  PCP: Colon Branch, MD    Hospital Summary  This is a 68 year old male with past medical history of MS (not taking his medication on a wheelchair-bound at baseline and with baseline R>L weakness), GERD, recurrent UTIs and self-catheterization for neurogenic bladder who presented to the ED via EMS for concern of worsening weakness confusion the last 1 week prior to arrival as well as increased weakness and difficulty with transfers.  Patient was noted to be drowsy in the ED after receiving Ativan prior to MRI brain.  Home health aide had tested positive for COVID-19 1 month ago, patient tested negative x2.  12/5-6: Found to be in respiratory distress with increased O2 demands, tachycardia, hypothermia, rapid response was called.  EKG showing atrial fibrillation with RVR and heart rates 120s to 140s.  He was started on IV Cardizem and required nonrebreather mask.  Bear hugger for hypothermia.  ABG: pH 7.3, PCO2 43, PO2 86, O2 96 CXR with new right lower lobe infiltrate concerning for aspiration pneumonia.  PCCM was consulted and patient was placed on phenylephrine due to hypotension (discontinued 12/7).  12/9: Transferred from stepdown to med telemetry  A & P   Principal Problem:   Acute respiratory failure with hypoxia (HCC) Active Problems:   GERD   BPH - self caths   Spastic diplegia (HCC)   Multiple sclerosis (HCC)   UTI (urinary tract infection)   Acute metabolic encephalopathy   Pneumonia   Atrial fibrillation with RVR (HCC)  Acute respiratory distress and acute hypoxic respiratory failure, secondary to multifocal pneumonia in setting of MS and A. fib with RVR resolved and off vasopressors since 12/7 Significant event 12/5 -12/6, hypothermic, in respiratory distress, tachycardia, tachypnea, but was placed on NRB, met sepsis criteria.  Patient was placed on phenylephrine due to  hypotension with BP in 70s to 80s. CCM (Dr. Lamonte Sakai) was consulted, patient DNR/DNI after discussion with patient's wife. COVID-19 test negative, BNP 85, CRP 5.8, ferritin 430, D-dimer 0.78 (CTA chest negative for PE but with multifocal pneumonia).  Cortisol level normal. IV Rocephin was discontinued, patient was placed on IV vancomycin and Zosyn.  MRSA negative, narrowed to IV Zosyn.  (Day 5 antibiotics) -Failed SLP evaluation: Continue nectar thick liquid -Change Zosyn for Augmentin to treat aspiration pneumonia and GBS UTI on 12/10.  Will need total 7 days antibiotics  Group B strep UTI in setting of recurrent UTIs with history of suspected neurogenic bladder from MS and self catheterizations  Present on admission, currently improved and with Foley -Continue Augmentin -At discharge, patient can continue Foley catheter and follow-up with urology outpatient for consideration of suprapubic catheter  Atrial fibrillation with RVR on the night 12/5-12/6, was placed on IV Cardizem drip, turned off due to hypotension.  CHA2DS2-VASc: 1. Now in NSR -hold off on anticoagulation at this time  Acute metabolic encephalopathy due to #1, GBS UTI, and Multiple sclerosis with spastic diplegia and suspected pseudoflare MRI brain without acute changes.  Has been off his baclofen due to encephalopathy which has since resolved. -Restarted baclofen tolerating  Pressure injury Stage I with nonblanchable redness with small blistered areas to the mid bilateral buttocks, present on admission -Wound care per nursing  Goals of care in the setting of worsening MS, multifocal pneumonia, respiratory failure, aspiration -Palliative medicine recently consulted for [place] and patient had changed CODE STATUS to DNR in  setting of rapid response as above.  Discussion with patient and wife at bedside 12/10 who wish to change CODE STATUS to full code now that he has had improved clinical status.  Right eye viral  conjunctivitis -Eyedrops and routine eye care  Hypoglycemia resolved with D5W  Hypernatremia resolved with D5W  Neurogenic bladder secondary to MS self catheterizes 5+ times per day.  Has Foley currently -At discharge, patient can continue Foley and follow-up with urology outpatient to discuss suprapubic catheter  Multiple sclerosis likely in pseudoflare given worsening symptoms, unchanged MRI brain and acute infection as above. Follows with Dr. Felecia Shelling, neurology, outpatient.  Not taking his ocrelizumab -Give IV steroids as he has not had much improvement with treatment of his pseudoflare with antibiotics -Patient now agreeable to CIR which I believe would be his best option for PT and completion of his IV steroids -Needs outpatient follow-up with neurology  DVT prophylaxis: Lovenox Diet: Nectar thick liquid Family Communication: Patient's wife has been updated over the phone  Disposition Plan: Pending CIR review.  Medically stable for discharge  Consultants  Palliative care PCCM  Procedures  None  Antibiotics   Anti-infectives (From admission, onward)   Start     Dose/Rate Route Frequency Ordered Stop   12/02/19 2200  amoxicillin-clavulanate (AUGMENTIN) 875-125 MG per tablet 1 tablet     1 tablet Oral Every 12 hours 12/02/19 1406     12/02/19 1500  amoxicillin (AMOXIL) capsule 500 mg  Status:  Discontinued     500 mg Oral Every 8 hours 12/02/19 1403 12/02/19 1406   11/28/19 2300  vancomycin (VANCOCIN) IVPB 750 mg/150 ml premix  Status:  Discontinued     750 mg 150 mL/hr over 60 Minutes Intravenous Every 12 hours 11/28/19 0954 11/28/19 1041   11/28/19 1800  piperacillin-tazobactam (ZOSYN) IVPB 3.375 g  Status:  Discontinued     3.375 g 12.5 mL/hr over 240 Minutes Intravenous Every 8 hours 11/28/19 1122 12/02/19 1403   11/28/19 1400  cefTRIAXone (ROCEPHIN) 1 g in sodium chloride 0.9 % 100 mL IVPB  Status:  Discontinued     1 g 200 mL/hr over 30 Minutes Intravenous Every 24  hours 11/27/19 2133 11/28/19 0914   11/28/19 1100  vancomycin (VANCOCIN) 2,000 mg in sodium chloride 0.9 % 500 mL IVPB  Status:  Discontinued     2,000 mg 250 mL/hr over 120 Minutes Intravenous  Once 11/28/19 0947 11/28/19 1041   11/28/19 1000  ceFEPIme (MAXIPIME) 2 g in sodium chloride 0.9 % 100 mL IVPB  Status:  Discontinued     2 g 200 mL/hr over 30 Minutes Intravenous Every 8 hours 11/28/19 0946 11/28/19 1041   11/27/19 1415  cefTRIAXone (ROCEPHIN) 1 g in sodium chloride 0.9 % 100 mL IVPB     1 g 200 mL/hr over 30 Minutes Intravenous  Once 11/27/19 1406 11/27/19 1520           Subjective   Patient seen and examined this morning at bedside no acute distress resting comfortably.  We had a discussion regarding home health with PT, outpatient PT and CIR.  This morning patient was agreeable to going to CIR for completion of his steroids and more intensive physical therapy which I stated he would likely benefit from the most.  Was related to the physical therapist who reevaluated the patient this afternoon.  Otherwise patient has no other complaints despite persistent weakness from his MS. Objective   Vitals:   12/02/19 0900 12/02/19 1256 12/02/19 2131  12/03/19 0527  BP: 137/74 136/72 (!) 161/84 (!) 146/91  Pulse: 72 70 60 72  Resp: 18 20 18 16   Temp: 98.2 F (36.8 C) 99 F (37.2 C) 98.2 F (36.8 C) 98.1 F (36.7 C)  TempSrc: Oral Oral    SpO2: 94% 97% 96% 98%  Weight:        Intake/Output Summary (Last 24 hours) at 12/03/2019 1350 Last data filed at 12/03/2019 0500 Gross per 24 hour  Intake 1180.92 ml  Output 1020 ml  Net 160.92 ml   Filed Weights   11/28/19 0900 11/29/19 0415 12/01/19 0500  Weight: 96.3 kg 97.8 kg 95.9 kg    Examination:  Physical Exam Vitals and nursing note reviewed.  Constitutional:      Appearance: He is not ill-appearing.  HENT:     Head: Normocephalic and atraumatic.     Mouth/Throat:     Mouth: Mucous membranes are moist.  Eyes:      Extraocular Movements: Extraocular movements intact.  Cardiovascular:     Rate and Rhythm: Normal rate and regular rhythm.  Pulmonary:     Effort: Pulmonary effort is normal.     Breath sounds: Normal breath sounds.  Abdominal:     General: Abdomen is flat.     Palpations: Abdomen is soft.  Musculoskeletal:        General: No swelling or tenderness.     Cervical back: Normal range of motion. No rigidity.  Neurological:     Mental Status: He is alert and oriented to person, place, and time.     Comments: Dysarthria  Psychiatric:        Mood and Affect: Mood normal.        Behavior: Behavior normal.     Data Reviewed: I have personally reviewed following labs and imaging studies  CBC: Recent Labs  Lab 11/27/19 0651 11/27/19 0658 11/29/19 0203 11/30/19 0226 12/01/19 0211 12/02/19 0536 12/03/19 0531  WBC 5.1   < > 13.1* 6.8 5.4 4.8 3.3*  NEUTROABS 3.6  --   --   --   --   --   --   HGB 13.8  --  11.4* 11.0* 11.4* 12.3* 12.9*  HCT 42.1  --  35.3* 34.3* 35.3* 37.2* 38.7*  MCV 94.0   < > 97.0 96.3 95.1 92.3 92.6  PLT 128*   < > 128* PLATELET CLUMPS NOTED ON SMEAR, COUNT APPEARS DECREASED 127* 160 157   < > = values in this interval not displayed.   Basic Metabolic Panel: Recent Labs  Lab 11/27/19 0743 11/29/19 0203 11/30/19 0226 12/01/19 0211 12/02/19 0536 12/03/19 0531  NA 146* 146* 142 140 142 141  K 4.9 4.1 3.9 3.7 3.8 4.1  CL 110 117* 113* 110 110 111  CO2 27 22 22  21* 22 20*  GLUCOSE 79 87 82 100* 77 124*  BUN 32* 27* 19 14 14 14   CREATININE 0.90 1.24 1.10 1.00 0.89 0.71  CALCIUM 11.4* 9.2 9.3 9.3 9.6 9.5  MG 1.8  --   --   --  1.9  --   PHOS 2.9  --   --   --   --   --    GFR: CrCl cannot be calculated (Unknown ideal weight.). Liver Function Tests: Recent Labs  Lab 11/27/19 0743 11/28/19 0618 11/29/19 0203  AST 78* 57* 47*  ALT 114* 89* 72*  ALKPHOS 111 75 78  BILITOT 0.4 1.1 0.9  PROT 7.2 6.0* 5.7*  ALBUMIN 3.9 3.3*  2.8*   No results for  input(s): LIPASE, AMYLASE in the last 168 hours. No results for input(s): AMMONIA in the last 168 hours. Coagulation Profile: No results for input(s): INR, PROTIME in the last 168 hours. Cardiac Enzymes: No results for input(s): CKTOTAL, CKMB, CKMBINDEX, TROPONINI in the last 168 hours. BNP (last 3 results) No results for input(s): PROBNP in the last 8760 hours. HbA1C: No results for input(s): HGBA1C in the last 72 hours. CBG: Recent Labs  Lab 12/01/19 0004 12/01/19 0409 12/01/19 0746 12/01/19 1251 12/01/19 1608  GLUCAP 112* 88 93 74 118*   Lipid Profile: No results for input(s): CHOL, HDL, LDLCALC, TRIG, CHOLHDL, LDLDIRECT in the last 72 hours. Thyroid Function Tests: Recent Labs    12/02/19 0536  TSH 6.555*  FREET4 1.15*   Anemia Panel: No results for input(s): VITAMINB12, FOLATE, FERRITIN, TIBC, IRON, RETICCTPCT in the last 72 hours. Sepsis Labs: Recent Labs  Lab 11/27/19 1643 11/28/19 0618  PROCALCITON  --  7.64  LATICACIDVEN 0.8  --     Recent Results (from the past 240 hour(s))  SARS CORONAVIRUS 2 (TAT 6-24 HRS) Nasopharyngeal Nasopharyngeal Swab     Status: None   Collection Time: 11/27/19  7:51 AM   Specimen: Nasopharyngeal Swab  Result Value Ref Range Status   SARS Coronavirus 2 NEGATIVE NEGATIVE Final    Comment: (NOTE) SARS-CoV-2 target nucleic acids are NOT DETECTED. The SARS-CoV-2 RNA is generally detectable in upper and lower respiratory specimens during the acute phase of infection. Negative results do not preclude SARS-CoV-2 infection, do not rule out co-infections with other pathogens, and should not be used as the sole basis for treatment or other patient management decisions. Negative results must be combined with clinical observations, patient history, and epidemiological information. The expected result is Negative. Fact Sheet for Patients: SugarRoll.be Fact Sheet for Healthcare  Providers: https://www.woods-mathews.com/ This test is not yet approved or cleared by the [place] FDA and  has been authorized for detection and/or diagnosis of SARS-CoV-2 by FDA under an Emergency Use Authorization (EUA). This EUA will remain  in effect (meaning this test can be used) for the duration of the COVID-19 declaration under Section 56 4(b)(1) of the Act, 21 U.S.C. section 360bbb-3(b)(1), unless the authorization is terminated or revoked sooner. Performed at [facility], [place] [address] [place], [place] [address]   Urine culture     Status: Abnormal   Collection Time: 11/27/19  9:49 AM   Specimen: Urine, Clean Catch  Result Value Ref Range Status   Specimen Description   Final    URINE, CLEAN CATCH Performed at [facility], [place] [address], [place], [place] [address]    Special Requests   Final    NONE Performed at [facility], [place] [address], [place], [place] [address]    Culture (A)  Final    >=100,000 COLONIES/mL GROUP B STREP(S.AGALACTIAE)ISOLATED TESTING AGAINST S. AGALACTIAE NOT ROUTINELY PERFORMED DUE TO PREDICTABILITY OF AMP/PEN/VAN SUSCEPTIBILITY. Performed at [facility], [place] [address], [place], [place] [address]    Report Status 11/28/2019 FINAL  Final  Culture, blood (routine x 2)     Status: None   Collection Time: 11/27/19  6:17 PM   Specimen: BLOOD  Result Value Ref Range Status   Specimen Description   Final    BLOOD RIGHT ANTECUBITAL Performed at [place] [address] [place],  [address]    Special Requests   Final  BOTTLES DRAWN AEROBIC AND ANAEROBIC Blood Culture adequate volume Performed at [place] [address] [place], [place] [address]    Culture   Final    NO GROWTH 5 DAYS Performed at [facility], [place] [address], [place], [place] [address]    Report Status 12/02/2019 FINAL  Final  Culture, blood  (routine x 2)     Status: None   Collection Time: 11/27/19  6:17 PM   Specimen: BLOOD  Result Value Ref Range Status   Specimen Description   Final    BLOOD RIGHT HAND Performed at [place] [address], [place], [place] [address]    Special Requests   Final    BOTTLES DRAWN AEROBIC AND ANAEROBIC Blood Culture adequate volume Performed at [place] [address], [place], [place] [address]    Culture   Final    NO GROWTH 5 DAYS Performed at [facility], [place] [address], [place], [place] [address]    Report Status 12/02/2019 FINAL  Final  MRSA PCR Screening     Status: None   Collection Time: 11/27/19 11:25 PM   Specimen: Nasal Mucosa; Nasopharyngeal  Result Value Ref Range Status   MRSA by PCR NEGATIVE NEGATIVE Final    Comment:        The GeneXpert MRSA Assay (FDA approved for NASAL specimens only), is one component of a comprehensive MRSA colonization surveillance program. It is not intended to diagnose MRSA infection nor to guide or monitor treatment for MRSA infections. Performed at [facility], [place] [address] [place], [place] [address]          Radiology Studies: ECHOCARDIOGRAM COMPLETE  Result Date: 12/01/2019   ECHOCARDIOGRAM REPORT   Patient Name:   Stephen Wade Date of Exam: 12/01/2019 Medical Rec #:  [id number]     Height:       72.0 in Accession #:    [id number]    Weight:       211.4 lb Date of Birth:  [birth year]     BSA:          2.18 m Patient Age:    71 years      BP:           137/96 mmHg Patient Gender: M             HR:           77 bpm. Exam Location:  Inpatient Procedure: 2D Echo, Cardiac Doppler and Color Doppler Indications:    Atrial fibrillation  History:        Patient has no prior history of Echocardiogram examinations.                 Arrythmias:Atrial Fibrillation. Multiple sclerosis, wheelchair                 bound, Pneumonia, resp. distress.  Sonographer:    Dustin Flock Referring Phys: [id number] Daney Moor E Steven Basso  Sonographer Comments: Incapable of moving. IMPRESSIONS  1. Left ventricular ejection fraction, by visual estimation, is 55 to 60%. The left ventricle has normal function. There is no left ventricular hypertrophy.  2. The left ventricle has no regional wall motion abnormalities.  3. Global right ventricle has normal systolic function.The right ventricular size is normal. No increase in right ventricular wall thickness.  4. Left atrial size was normal.  5. Right atrial size was normal.  6. Presence of pericardial fat  pad.  7. The mitral valve is grossly normal. No evidence of mitral valve regurgitation.  8. The tricuspid valve is grossly normal. Tricuspid valve regurgitation is trivial.  9. The aortic valve is grossly normal. Aortic valve regurgitation is not visualized. No evidence of aortic valve sclerosis or stenosis. 10. The pulmonic valve was grossly normal. Pulmonic valve regurgitation is not visualized. 11. The inferior vena cava is normal in size with greater than 50% respiratory variability, suggesting right atrial pressure of 3 mmHg. FINDINGS  Left Ventricle: Left ventricular ejection fraction, by visual estimation, is 55 to 60%. The left ventricle has normal function. The left ventricle has no regional wall motion abnormalities. The left ventricular internal cavity size was the left ventricle is normal in size. There is no left ventricular hypertrophy. Right Ventricle: The right ventricular size is normal. No increase in right ventricular wall thickness. Global RV systolic function is has normal systolic function. Left Atrium: Left atrial size was normal in size. Right Atrium: Right atrial size was normal in size Pericardium: There is no evidence of pericardial effusion. Presence of pericardial fat pad. Mitral Valve: The mitral valve is grossly normal. No evidence of mitral valve regurgitation. Tricuspid Valve: The tricuspid valve is grossly normal.  Tricuspid valve regurgitation is trivial. Aortic Valve: The aortic valve is grossly normal. Aortic valve regurgitation is not visualized. The aortic valve is structurally normal, with no evidence of sclerosis or stenosis. Pulmonic Valve: The pulmonic valve was grossly normal. Pulmonic valve regurgitation is not visualized. Pulmonic regurgitation is not visualized. Aorta: The aortic root is normal in size and structure. Venous: The inferior vena cava is normal in size with greater than 50% respiratory variability, suggesting right atrial pressure of 3 mmHg. IAS/Shunts: No atrial level shunt detected by color flow Doppler.  LEFT VENTRICLE PLAX 2D LVIDd:         5.20 cm  Diastology LVIDs:         3.40 cm  LV e' lateral:   5.44 cm/s LV PW:         1.00 cm  LV E/e' lateral: 10.4 LV IVS:        0.90 cm  LV e' medial:    7.18 cm/s LVOT diam:     2.20 cm  LV E/e' medial:  7.9 LV SV:         82 ml LV SV Index:   36.87 LVOT Area:     3.80 cm  RIGHT VENTRICLE RV Basal diam:  3.10 cm RV S prime:     7.40 cm/s TAPSE (M-mode): 2.8 cm LEFT ATRIUM             Index       RIGHT ATRIUM           Index LA diam:        3.30 cm 1.51 cm/m  RA Area:     10.50 cm LA Vol (A2C):   41.3 ml 18.93 ml/m RA Volume:   20.10 ml  9.21 ml/m LA Vol (A4C):   61.5 ml 28.19 ml/m LA Biplane Vol: 54.1 ml 24.80 ml/m  AORTIC VALVE LVOT Vmax:   68.50 cm/s LVOT Vmean:  47.400 cm/s LVOT VTI:    0.166 m  AORTA Ao Root diam: 3.00 cm MITRAL VALVE MV Area (PHT): 5.13 cm             SHUNTS MV PHT:        42.92 msec  Systemic VTI:  0.17 m MV Decel Time: 148 msec             Systemic Diam: 2.20 cm MV E velocity: 56.75 cm/s 103 cm/s MV A velocity: 67.30 cm/s 70.3 cm/s MV E/A ratio:  0.84       1.5  Eleonore Chiquito MD Electronically signed by Eleonore Chiquito MD Signature Date/Time: 12/01/2019/5:34:04 PM    Final         Scheduled Meds: . amoxicillin-clavulanate  1 tablet Oral Q12H  . baclofen  10 mg Oral TID  . chlorhexidine  15 mL Mouth Rinse BID   . Chlorhexidine Gluconate Cloth  6 each Topical Q0600  . enoxaparin (LOVENOX) injection  40 mg Subcutaneous Q24H  . erythromycin  1 application Right Eye QHS  . mouth rinse  15 mL Mouth Rinse q12n4p  . pantoprazole  40 mg Oral QHS  . polyethylene glycol  17 g Oral Daily  . polyvinyl alcohol  1 drop Right Eye TID  . senna-docusate  1 tablet Oral BID   Continuous Infusions: . sodium chloride 10 mL/hr at 12/01/19 1800  . methylPREDNISolone (SOLU-MEDROL) injection 1,000 mg (12/03/19 0913)     LOS: 6 days    Time spent: 27 minutes with over 50% of the time coordinating the patient's care    Harold Hedge, DO [facility] Pager [phone]  If 7PM-7AM, please contact night-coverage [email or web address] Password [email or web address] 12/03/2019, 1:50 PM

## 2019-12-03 NOTE — Progress Notes (Signed)
SLP Cancellation Note  Patient Details Name: Stephen Wade MRN: [id number] DOB: September 09, 1951   Cancelled treatment:       Reason Eval/Treat Not Completed: Other (comment)(multiple attempts to see pt today, first pt needed cleaned up after SLP and NT pulled up in bed - 2nd attempt pt was with PT, will continue efforts and follow up Monday 12/14. If pt is to dc hospital prior to SLP visit, please assure pt receives follow up SLP for his dysphagia management. Thanks)  Kathleen Lime, MS [facility] SLP Acute Rehab Services Office [phone]  Macario Golds 12/03/2019, 3:30 PM

## 2019-12-04 DIAGNOSIS — R197 Diarrhea, unspecified: Secondary | ICD-10-CM

## 2019-12-04 LAB — CBC
HCT: 38.1 % — ABNORMAL LOW (ref 39.0–52.0)
Hemoglobin: 12.9 g/dL — ABNORMAL LOW (ref 13.0–17.0)
MCH: 31.2 pg (ref 26.0–34.0)
MCHC: 33.9 g/dL (ref 30.0–36.0)
MCV: 92.3 fL (ref 80.0–100.0)
Platelets: 200 10*3/uL (ref 150–400)
RBC: 4.13 MIL/uL — ABNORMAL LOW (ref 4.22–5.81)
RDW: 15.8 % — ABNORMAL HIGH (ref 11.5–15.5)
WBC: 14 10*3/uL — ABNORMAL HIGH (ref 4.0–10.5)
nRBC: 0.1 % (ref 0.0–0.2)

## 2019-12-04 LAB — BASIC METABOLIC PANEL
Anion gap: 11 (ref 5–15)
BUN: 21 mg/dL (ref 8–23)
CO2: 23 mmol/L (ref 22–32)
Calcium: 9.7 mg/dL (ref 8.9–10.3)
Chloride: 111 mmol/L (ref 98–111)
Creatinine, Ser: 0.76 mg/dL (ref 0.61–1.24)
GFR calc Af Amer: >60 mL/min (ref >60)
GFR calc non Af Amer: >60 mL/min (ref >60)
Glucose, Bld: 108 mg/dL — ABNORMAL HIGH (ref 70–99)
Potassium: 4.1 mmol/L (ref 3.5–5.1)
Sodium: 145 mmol/L (ref 135–145)

## 2019-12-04 LAB — CREATININE, SERUM
Creatinine, Ser: 0.78 mg/dL (ref 0.61–1.24)
GFR calc Af Amer: >60 mL/min (ref >60)
GFR calc non Af Amer: >60 mL/min (ref >60)

## 2019-12-04 MED ORDER — BACLOFEN 10 MG PO TABS
5.0000 mg | ORAL_TABLET | Freq: Three times a day (TID) | ORAL | Status: DC
  Administered 2019-12-04 – 2019-12-07 (×9): 5 mg via ORAL
  Filled 2019-12-04 (×9): qty 1

## 2019-12-04 NOTE — Progress Notes (Signed)
Rehab Admissions Coordinator Note:  Per PT recommendation, patient was screened by Michel Santee, PT, DPT for appropriateness for an Inpatient Acute Rehab Consult.  At this time, we are recommending Inpatient Rehab consult, so that we can better evaluate pt for candidacy.  I will place an order.  Michel Santee, PT, DPT 12/04/2019, 5:07 PM  I can be reached at [phone].

## 2019-12-04 NOTE — Progress Notes (Signed)
Orthostatic VS completed per NT, pt is chairbound at baseline and unable to stand for VS reading

## 2019-12-04 NOTE — Progress Notes (Signed)
PROGRESS NOTE    Stephen Wade    Code Status: Full Code  BXU:[id number] DOB: 1951/04/20 DOA: 11/27/2019  PCP: Colon Branch, MD    Hospital Summary  This is a 68 year old male with past medical history of MS (not taking his medication on a wheelchair-bound at baseline and with baseline R>L weakness), GERD, recurrent UTIs and self-catheterization for neurogenic bladder who presented to the ED via EMS for concern of worsening weakness confusion the last 1 week prior to arrival as well as increased weakness and difficulty with transfers.  Patient was noted to be drowsy in the ED after receiving Ativan prior to MRI brain.  Home health aide had tested positive for COVID-19 1 month ago, patient tested negative x2.  12/5-6: Found to be in respiratory distress with increased O2 demands, tachycardia, hypothermia, rapid response was called.  EKG showing atrial fibrillation with RVR and heart rates 120s to 140s.  He was started on IV Cardizem and required nonrebreather mask.  Bear hugger for hypothermia.  ABG: pH 7.3, PCO2 43, PO2 86, O2 96 CXR with new right lower lobe infiltrate concerning for aspiration pneumonia.  PCCM was consulted and patient was placed on phenylephrine due to hypotension (discontinued 12/7).  12/9: Transferred from stepdown to med telemetry  A & P   Principal Problem:   Acute respiratory failure with hypoxia (HCC) Active Problems:   GERD   BPH - self caths   Spastic diplegia (HCC)   Multiple sclerosis (HCC)   UTI (urinary tract infection)   Acute metabolic encephalopathy   Pneumonia   Atrial fibrillation with RVR (HCC)  Diarrhea 5+ loose BMs in the past 24 hours. Currently on Senna-Colace and Miralax but also on antibiotics for several days now. Reported increased weakness -Discontinue laxatives -If still having loose BMs tomorrow would consider testing for C diff -Orthostatics  Increased generalized weakness  unknown etiology, Possibly from MS vs. Dehydration from  BMs, vs. Debility vs. Worsened infection vs. hypoglycemia - CBC, BMP - Orthostatics  Acute respiratory distress and acute hypoxic respiratory failure, secondary to multifocal pneumonia in setting of MS and A. fib with RVR resolved and off vasopressors since 12/7 Significant event 12/5 -12/6, hypothermic, in respiratory distress, tachycardia, tachypnea, but was placed on NRB, met sepsis criteria.  Patient was placed on phenylephrine due to hypotension with BP in 70s to 80s. CCM (Dr. Lamonte Sakai) was consulted, patient DNR/DNI after discussion with patient's wife. COVID-19 test negative, BNP 85, CRP 5.8, ferritin 430, D-dimer 0.78 (CTA chest negative for PE but with multifocal pneumonia).  Cortisol level normal. IV Rocephin was discontinued, patient was placed on IV vancomycin and Zosyn.  MRSA negative, narrowed to IV Zosyn which was changed to Augmentin.  (Day 6/7-10 antibiotics) -Failed SLP evaluation: Continue nectar thick liquid, needs outpatient follow up at discharge -Continue augmentin -Follow up labs as above  Group B strep UTI in setting of recurrent UTIs with history of suspected neurogenic bladder from MS and self catheterizations  Present on admission, currently improved and with Foley -Continue Augmentin -At discharge, patient can continue Foley catheter and follow-up with urology outpatient for consideration of suprapubic catheter -antibiotics As above  Atrial fibrillation with RVR on the night 12/5-12/6, was placed on IV Cardizem drip, turned off due to hypotension.  CHA2DS2-VASc: 1. Now in NSR -hold off on anticoagulation at this time  Acute metabolic encephalopathy due to #1, GBS UTI, and Multiple sclerosis with spastic diplegia and suspected pseudoflare MRI brain without acute changes.  Has  been off his baclofen due to encephalopathy which has since resolved. -Restarted baclofen, now with increased reported weakness -Will reduce baclofen  Pressure injury Stage I with  nonblanchable redness with small blistered areas to the mid bilateral buttocks, present on admission -Wound care per nursing  Goals of care in the setting of worsening MS, multifocal pneumonia, respiratory failure, aspiration -Palliative medicine recently consulted for [place] and patient had changed CODE STATUS to DNR in setting of rapid response as above.  Discussion with patient and wife at bedside 12/10 who wish to change CODE STATUS to full code now that he has had improved clinical status.  Right eye viral conjunctivitis -Eyedrops and routine eye care  Hypoglycemia resolved with D5W  Hypernatremia resolved with D5W  Neurogenic bladder secondary to MS self catheterizes 5+ times per day.  Has Foley currently -At discharge, patient can continue Foley and follow-up with urology outpatient to discuss suprapubic catheter  Multiple sclerosis likely in pseudoflare given worsening symptoms, unchanged MRI brain and acute infection as above. Follows with Dr. Felecia Shelling, neurology, outpatient.  Not taking his ocrelizumab -Give IV steroids as he has not had much improvement with treatment of his pseudoflare with antibiotics x 5 days -Patient now agreeable to CIR which I believe would be his best option for PT and completion of his IV steroids -Needs outpatient follow-up with neurology  DVT prophylaxis: Lovenox Diet: Nectar thick liquid Family Communication: Patient's wife has been updated over the phone  Disposition Plan: Pending CIR review.  Medically stable for discharge  Consultants  Palliative care PCCM  Procedures  None  Antibiotics   Anti-infectives (From admission, onward)   Start     Dose/Rate Route Frequency Ordered Stop   12/02/19 2200  amoxicillin-clavulanate (AUGMENTIN) 875-125 MG per tablet 1 tablet     1 tablet Oral Every 12 hours 12/02/19 1406     12/02/19 1500  amoxicillin (AMOXIL) capsule 500 mg  Status:  Discontinued     500 mg Oral Every 8 hours 12/02/19 1403 12/02/19 1406    11/28/19 2300  vancomycin (VANCOCIN) IVPB 750 mg/150 ml premix  Status:  Discontinued     750 mg 150 mL/hr over 60 Minutes Intravenous Every 12 hours 11/28/19 0954 11/28/19 1041   11/28/19 1800  piperacillin-tazobactam (ZOSYN) IVPB 3.375 g  Status:  Discontinued     3.375 g 12.5 mL/hr over 240 Minutes Intravenous Every 8 hours 11/28/19 1122 12/02/19 1403   11/28/19 1400  cefTRIAXone (ROCEPHIN) 1 g in sodium chloride 0.9 % 100 mL IVPB  Status:  Discontinued     1 g 200 mL/hr over 30 Minutes Intravenous Every 24 hours 11/27/19 2133 11/28/19 0914   11/28/19 1100  vancomycin (VANCOCIN) 2,000 mg in sodium chloride 0.9 % 500 mL IVPB  Status:  Discontinued     2,000 mg 250 mL/hr over 120 Minutes Intravenous  Once 11/28/19 0947 11/28/19 1041   11/28/19 1000  ceFEPIme (MAXIPIME) 2 g in sodium chloride 0.9 % 100 mL IVPB  Status:  Discontinued     2 g 200 mL/hr over 30 Minutes Intravenous Every 8 hours 11/28/19 0946 11/28/19 1041   11/27/19 1415  cefTRIAXone (ROCEPHIN) 1 g in sodium chloride 0.9 % 100 mL IVPB     1 g 200 mL/hr over 30 Minutes Intravenous  Once 11/27/19 1406 11/27/19 1520           Subjective   Patient reports increased weakness but unable to tell if this is generalized weakness or worsened in his  extremities as he has not ambulated yet today. He also states that he is having 5+ loose BMs in the past 24 hours, nursing stated she did not get this at signout this morning and reports he only had some minimal loose stool today. He wishes to go home. I discussed with him that since he has these changes he is not safe to go home yet but should still be evaluated by CIR. He is in agreement. Otherwise denies any shortness of breath, fever, urinary pain or incontinence. Denies other complaints.    Objective   Vitals:   12/03/19 0527 12/03/19 1407 12/03/19 2053 12/04/19 0441  BP: (!) 146/91 137/80 130/78 (!) 142/80  Pulse: 72 78 77 75  Resp: 16 18 18 18   Temp: 98.1 F (36.7 C) 98.3  F (36.8 C) 98.1 F (36.7 C) 98.1 F (36.7 C)  TempSrc:      SpO2: 98% 94% 92% 92%  Weight:        Intake/Output Summary (Last 24 hours) at 12/04/2019 1009 Last data filed at 12/04/2019 0700 Gross per 24 hour  Intake --  Output 1105 ml  Net -1105 ml   Filed Weights   11/28/19 0900 11/29/19 0415 12/01/19 0500  Weight: 96.3 kg 97.8 kg 95.9 kg    Examination:  Physical Exam Vitals and nursing note reviewed.  Constitutional:      Appearance: Normal appearance. He is not ill-appearing or toxic-appearing.  HENT:     Head: Normocephalic and atraumatic.     Nose: Nose normal.     Mouth/Throat:     Mouth: Mucous membranes are moist.  Eyes:     Extraocular Movements: Extraocular movements intact.  Cardiovascular:     Rate and Rhythm: Normal rate and regular rhythm.  Pulmonary:     Effort: Pulmonary effort is normal.     Breath sounds: Normal breath sounds.  Abdominal:     General: Abdomen is flat. There is no distension.     Palpations: Abdomen is soft.     Tenderness: There is no abdominal tenderness.  Musculoskeletal:     Comments: Decreased ROM R>L. LLE 3/5 muscle strength  Neurological:     Mental Status: He is alert and oriented to person, place, and time. Mental status is at baseline.     Comments: Seems at/near baseline  Psychiatric:        Mood and Affect: Mood normal.        Behavior: Behavior normal.     Data Reviewed: I have personally reviewed following labs and imaging studies  CBC: Recent Labs  Lab 11/29/19 0203 11/30/19 0226 12/01/19 0211 12/02/19 0536 12/03/19 0531  WBC 13.1* 6.8 5.4 4.8 3.3*  HGB 11.4* 11.0* 11.4* 12.3* 12.9*  HCT 35.3* 34.3* 35.3* 37.2* 38.7*  MCV 97.0 96.3 95.1 92.3 92.6  PLT 128* PLATELET CLUMPS NOTED ON SMEAR, COUNT APPEARS DECREASED 127* 160 149   Basic Metabolic Panel: Recent Labs  Lab 11/29/19 0203 11/30/19 0226 12/01/19 0211 12/02/19 0536 12/03/19 0531 12/04/19 0511  NA 146* 142 140 142 141  --   K 4.1 3.9  3.7 3.8 4.1  --   CL 117* 113* 110 110 111  --   CO2 22 22 21* 22 20*  --   GLUCOSE 87 82 100* 77 124*  --   BUN 27* 19 14 14 14   --   CREATININE 1.24 1.10 1.00 0.89 0.71 0.78  CALCIUM 9.2 9.3 9.3 9.6 9.5  --   MG  --   --   --  1.9  --   --    GFR: CrCl cannot be calculated (Unknown ideal weight.). Liver Function Tests: Recent Labs  Lab 11/28/19 0618 11/29/19 0203  AST 57* 47*  ALT 89* 72*  ALKPHOS 75 78  BILITOT 1.1 0.9  PROT 6.0* 5.7*  ALBUMIN 3.3* 2.8*   No results for input(s): LIPASE, AMYLASE in the last 168 hours. No results for input(s): AMMONIA in the last 168 hours. Coagulation Profile: No results for input(s): INR, PROTIME in the last 168 hours. Cardiac Enzymes: No results for input(s): CKTOTAL, CKMB, CKMBINDEX, TROPONINI in the last 168 hours. BNP (last 3 results) No results for input(s): PROBNP in the last 8760 hours. HbA1C: No results for input(s): HGBA1C in the last 72 hours. CBG: Recent Labs  Lab 12/01/19 0004 12/01/19 0409 12/01/19 0746 12/01/19 1251 12/01/19 1608  GLUCAP 112* 88 93 74 118*   Lipid Profile: No results for input(s): CHOL, HDL, LDLCALC, TRIG, CHOLHDL, LDLDIRECT in the last 72 hours. Thyroid Function Tests: Recent Labs    12/02/19 0536  TSH 6.555*  FREET4 1.15*   Anemia Panel: No results for input(s): VITAMINB12, FOLATE, FERRITIN, TIBC, IRON, RETICCTPCT in the last 72 hours. Sepsis Labs: Recent Labs  Lab 11/27/19 1643 11/28/19 0618  PROCALCITON  --  7.64  LATICACIDVEN 0.8  --     Recent Results (from the past 240 hour(s))  SARS CORONAVIRUS 2 (TAT 6-24 HRS) Nasopharyngeal Nasopharyngeal Swab     Status: None   Collection Time: 11/27/19  7:51 AM   Specimen: Nasopharyngeal Swab  Result Value Ref Range Status   SARS Coronavirus 2 NEGATIVE NEGATIVE Final    Comment: (NOTE) SARS-CoV-2 target nucleic acids are NOT DETECTED. The SARS-CoV-2 RNA is generally detectable in upper and lower respiratory specimens during the  acute phase of infection. Negative results do not preclude SARS-CoV-2 infection, do not rule out co-infections with other pathogens, and should not be used as the sole basis for treatment or other patient management decisions. Negative results must be combined with clinical observations, patient history, and epidemiological information. The expected result is Negative. Fact Sheet for Patients: SugarRoll.be Fact Sheet for Healthcare Providers: https://www.woods-mathews.com/ This test is not yet approved or cleared by the [place] FDA and  has been authorized for detection and/or diagnosis of SARS-CoV-2 by FDA under an Emergency Use Authorization (EUA). This EUA will remain  in effect (meaning this test can be used) for the duration of the COVID-19 declaration under Section 56 4(b)(1) of the Act, 21 U.S.C. section 360bbb-3(b)(1), unless the authorization is terminated or revoked sooner. Performed at [facility], [place] [address] [place], [place] [address]   Urine culture     Status: Abnormal   Collection Time: 11/27/19  9:49 AM   Specimen: Urine, Clean Catch  Result Value Ref Range Status   Specimen Description   Final    URINE, CLEAN CATCH Performed at [facility], [place] [address], [place], [place] [address]    Special Requests   Final    NONE Performed at [facility], [place] [address], [place], [place] [address]    Culture (A)  Final    >=100,000 COLONIES/mL GROUP B STREP(S.AGALACTIAE)ISOLATED TESTING AGAINST S. AGALACTIAE NOT ROUTINELY PERFORMED DUE TO PREDICTABILITY OF AMP/PEN/VAN SUSCEPTIBILITY. Performed at [facility], [place] [address] [place],  [address]    Report Status 11/28/2019 FINAL  Final  Culture, blood (routine x 2)     Status: None   Collection  Time: 11/27/19  6:17 PM   Specimen: BLOOD  Result Value Ref Range Status   Specimen Description   Final    BLOOD  RIGHT ANTECUBITAL Performed at [place] [address], [place], [place] [address]    Special Requests   Final    BOTTLES DRAWN AEROBIC AND ANAEROBIC Blood Culture adequate volume Performed at [place] [address], [place], [place] [address]    Culture   Final    NO GROWTH 5 DAYS Performed at [facility], [place] [address], [place], [place] [address]    Report Status 12/02/2019 FINAL  Final  Culture, blood (routine x 2)     Status: None   Collection Time: 11/27/19  6:17 PM   Specimen: BLOOD  Result Value Ref Range Status   Specimen Description   Final    BLOOD RIGHT HAND Performed at [place] [address] [place], [place] [address]    Special Requests   Final    BOTTLES DRAWN AEROBIC AND ANAEROBIC Blood Culture adequate volume Performed at [place] [address] [place], [place] [address]    Culture   Final    NO GROWTH 5 DAYS Performed at [facility], [place] [address] [place], [place] [address]    Report Status 12/02/2019 FINAL  Final  MRSA PCR Screening     Status: None   Collection Time: 11/27/19 11:25 PM   Specimen: Nasal Mucosa; Nasopharyngeal  Result Value Ref Range Status   MRSA by PCR NEGATIVE NEGATIVE Final    Comment:        The GeneXpert MRSA Assay (FDA approved for NASAL specimens only), is one component of a comprehensive MRSA colonization surveillance program. It is not intended to diagnose MRSA infection nor to guide or monitor treatment for MRSA infections. Performed at [facility], [place] [address] [place], [place] [address]          Radiology Studies: No results found.      Scheduled Meds: . amoxicillin-clavulanate  1 tablet Oral Q12H  . baclofen  10 mg Oral TID  . chlorhexidine  15 mL Mouth Rinse BID  . Chlorhexidine Gluconate Cloth  6 each Topical Q0600  . enoxaparin (LOVENOX) injection  40 mg  Subcutaneous Q24H  . erythromycin  1 application Right Eye QHS  . mouth rinse  15 mL Mouth Rinse q12n4p  . pantoprazole  40 mg Oral QHS  . polyvinyl alcohol  1 drop Right Eye TID   Continuous Infusions: . sodium chloride 10 mL/hr at 12/01/19 1800  . methylPREDNISolone (SOLU-MEDROL) injection 1,000 mg (12/03/19 0913)     LOS: 7 days    Time spent: 33 minutes with over 50% of the time coordinating the patient's care    Harold Hedge, DO [facility] Pager [phone]  If 7PM-7AM, please contact night-coverage [email or web address] Password [email or web address] 12/04/2019, 10:09 AM

## 2019-12-05 LAB — BASIC METABOLIC PANEL
Anion gap: 9 (ref 5–15)
BUN: 27 mg/dL — ABNORMAL HIGH (ref 8–23)
CO2: 23 mmol/L (ref 22–32)
Calcium: 9.5 mg/dL (ref 8.9–10.3)
Chloride: 112 mmol/L — ABNORMAL HIGH (ref 98–111)
Creatinine, Ser: 0.76 mg/dL (ref 0.61–1.24)
GFR calc Af Amer: >60 mL/min (ref >60)
GFR calc non Af Amer: >60 mL/min (ref >60)
Glucose, Bld: 107 mg/dL — ABNORMAL HIGH (ref 70–99)
Potassium: 3.9 mmol/L (ref 3.5–5.1)
Sodium: 144 mmol/L (ref 135–145)

## 2019-12-05 LAB — CBC
HCT: 37.2 % — ABNORMAL LOW (ref 39.0–52.0)
Hemoglobin: 12.5 g/dL — ABNORMAL LOW (ref 13.0–17.0)
MCH: 30.6 pg (ref 26.0–34.0)
MCHC: 33.6 g/dL (ref 30.0–36.0)
MCV: 91.2 fL (ref 80.0–100.0)
Platelets: 235 10*3/uL (ref 150–400)
RBC: 4.08 MIL/uL — ABNORMAL LOW (ref 4.22–5.81)
RDW: 16.1 % — ABNORMAL HIGH (ref 11.5–15.5)
WBC: 12.1 10*3/uL — ABNORMAL HIGH (ref 4.0–10.5)
nRBC: 0.2 % (ref 0.0–0.2)

## 2019-12-05 MED ORDER — POLYETHYLENE GLYCOL 3350 17 G PO PACK
17.0000 g | PACK | Freq: Every day | ORAL | Status: DC | PRN
  Administered 2019-12-05: 17:00:00 17 g via ORAL
  Filled 2019-12-05: qty 1

## 2019-12-05 MED ORDER — SENNOSIDES-DOCUSATE SODIUM 8.6-50 MG PO TABS
1.0000 | ORAL_TABLET | Freq: Every day | ORAL | Status: DC
  Administered 2019-12-05 – 2019-12-06 (×2): 1 via ORAL
  Filled 2019-12-05 (×2): qty 1

## 2019-12-05 NOTE — Progress Notes (Signed)
Occupational Therapy Treatment Patient Details Name: Stephen Wade MRN: [id number] DOB: 1951/12/08 Today's Date: 12/05/2019    History of present illness 68 year old male with past medical history of MS (not taking his medication on a wheelchair-bound at baseline and with baseline R>L weakness), GERD, recurrent UTIs and self-catheterization for neurogenic bladder and admitted for Acute hypoxic respiratory failure, secondary to multifocal pneumonia in setting of MS and A. fib with RVR and UTI   OT comments  Pt seen for follow-up session to established theraband HEP program for general BUE strengthening. Bed level exercises. Pt tolerated well. D/c plan remains appropriate.    Follow Up Recommendations  CIR    Equipment Recommendations  Other (comment)(defer to next venue)    Recommendations for Other Services      Precautions / Restrictions Precautions Precautions: Fall Precaution Comments: hx MS (R LE deficits > L LE, spastic diplegia) Restrictions Weight Bearing Restrictions: No       Mobility Bed Mobility Overal bed mobility: Needs Assistance Bed Mobility: Supine to Sit;Sit to Supine     Supine to sit: Max assist;Total assist;+2 for physical assistance;HOB elevated Sit to supine: Total assist;+2 for physical assistance   General bed mobility comments: Total A to advance BLE across mattress, max-total A to powerup trunk Pt attempting to use BUE to facilitate bed mobility.   Transfers                 General transfer comment: not tested for safety    Balance Overall balance assessment: Needs assistance Sitting-balance support: Bilateral upper extremity supported;Single extremity supported;Feet supported Sitting balance-Leahy Scale: Poor Sitting balance - Comments: sat EOB several mintues at min to max A level. Difficulty maintaining trunk control. A few posterior LOB requiring assist to steady.                                    ADL either  performed or assessed with clinical judgement   ADL Overall ADL's : Needs assistance/impaired Eating/Feeding: Minimal assistance;Set up;Bed level   Grooming: Moderate assistance;Bed level   Upper Body Bathing: Sitting;Maximal assistance Upper Body Bathing Details (indicate cue type and reason): able to sit EOB with BUE support and min to max A Lower Body Bathing: Total assistance;+2 for physical assistance;Bed level;Sitting/lateral leans   Upper Body Dressing : Maximal assistance;Sitting Upper Body Dressing Details (indicate cue type and reason): able to sit EOB with BUE support and min to max A Lower Body Dressing: Total assistance;Sitting/lateral leans;Bed level;+2 for physical assistance                 General ADL Comments: Pt completed bed mobility with +2 max - total A, sat EOB several minutes with min to mod A +2 s/e, physical A. Pt able to facilitate scooting towards HOB (towards stronger side).      Vision Baseline Vision/History: Wears glasses Wears Glasses: At all times     Perception     Praxis      Cognition Arousal/Alertness: Awake/alert Behavior During Therapy: WFL for tasks assessed/performed Overall Cognitive Status: Within Functional Limits for tasks assessed                                          Exercises Exercises: Other exercises Other Exercises Other Exercises: BUE general strengthening utilizing various levels of theraband  tied to sides and foot of bed. Pt eager to return to exercise. Reports he enjoys exercising. Completed about 6 reps of BUE followed by LUE and RUE exercises in isolation. reports he does 6 reps of 6 at home.    Shoulder Instructions       General Comments Spouse present and encouraging pt.     Pertinent Vitals/ Pain       Pain Assessment: No/denies pain Faces Pain Scale: Hurts even more Pain Location: grimacing with bed mobility, BLE Pain Descriptors / Indicators: Grimacing Pain Intervention(s):  Monitored during session;Repositioned  Home Living Family/patient expects to be discharged to:: Private residence Living Arrangements: Spouse/significant other Available Help at Discharge: Family Type of Home: House Home Access: Ramped entrance     Home Layout: Able to live on main level with bedroom/bathroom               Home Equipment: Transport planner;Wheelchair - Education officer, community - power;Hospital bed   Additional Comments: sleeps in lazyboy lift chair      Prior Functioning/Environment Level of Independence: Needs assistance    ADL's / Homemaking Assistance Needed: aide typically assists with ADLs however aide out with (+) with covid-19 currently per pt       Frequency  Min 3X/week        Progress Toward Goals  OT Goals(current goals can now be found in the care plan section)     Acute Rehab OT Goals Patient Stated Goal: home OT Goal Formulation: With patient/family Time For Goal Achievement: 12/19/19 Potential to Achieve Goals: Good ADL Goals Pt Will Perform Grooming: with min assist;sitting Pt Will Transfer to Toilet: with min assist;bedside commode;with transfer board Pt/caregiver will Perform Home Exercise Program: Increased strength;Both right and left upper extremity;With Supervision Additional ADL Goal #1: Pt will sit EOB 5 minutes at min A level to prepare for EOB ADLs/OOB transfer.  Plan Discharge plan remains appropriate    Co-evaluation                 AM-PAC OT "6 Clicks" Daily Activity     Outcome Measure   Help from another person eating meals?: A Little Help from another person taking care of personal grooming?: A Lot Help from another person toileting, which includes using toliet, bedpan, or urinal?: Total Help from another person bathing (including washing, rinsing, drying)?: Total Help from another person to put on and taking off regular upper body clothing?: A Lot Help from another person to put on and taking off regular  lower body clothing?: Total 6 Click Score: 10    End of Session    OT Visit Diagnosis: Muscle weakness (generalized) (M62.81);Other abnormalities of gait and mobility (R26.89);Pain   Activity Tolerance Patient tolerated treatment well   Patient Left in bed;with call bell/phone within reach;with bed alarm set;with family/visitor present   Nurse Communication          Time: 4270-6237 OT Time Calculation (min): 9 min  Charges: OT General Charges $OT Visit: 1 Visit  OT Treatments  $Therapeutic Exercise: 8-22 mins  Tyrone Schimke, OT Acute Rehabilitation Services Pager: [phone] Office: [phone]    Hortencia Pilar 12/05/2019, 3:50 PM

## 2019-12-05 NOTE — Progress Notes (Signed)
PROGRESS NOTE    Stephen Wade    Code Status: Full Code  YIR:[id number] DOB: [birth year] DOA: 11/27/2019  PCP: Colon Branch, MD    Hospital Summary  This is a 68 year old male with past medical history of MS (not taking his medication on a wheelchair-bound at baseline and with baseline R>L weakness), GERD, recurrent UTIs and self-catheterization for neurogenic bladder who presented to the ED via EMS for concern of worsening weakness confusion the last 1 week prior to arrival as well as increased weakness and difficulty with transfers.  Patient was noted to be drowsy in the ED after receiving Ativan prior to MRI brain.  Home health aide had tested positive for COVID-19 1 month ago, patient tested negative x2.  12/5-6: Found to be in respiratory distress with increased O2 demands, tachycardia, hypothermia, rapid response was called.  EKG showing atrial fibrillation with RVR and heart rates 120s to 140s.  He was started on IV Cardizem and required nonrebreather mask.  Bear hugger for hypothermia.  ABG: pH 7.3, PCO2 43, PO2 86, O2 96 CXR with new right lower lobe infiltrate concerning for aspiration pneumonia.  PCCM was consulted and patient was placed on phenylephrine due to hypotension (discontinued 12/7).  12/9: Transferred from stepdown to med telemetry  A & P   Principal Problem:   Acute respiratory failure with hypoxia (HCC) Active Problems:   GERD   BPH - self caths   Spastic diplegia (HCC)   Multiple sclerosis (HCC)   UTI (urinary tract infection)   Acute metabolic encephalopathy   Pneumonia   Atrial fibrillation with RVR (HCC)  Diarrhea, no constipation 5+ loose BMs in the past 24 hours. Currently on Senna-Colace and Miralax but also on antibiotics for several days now. Reported increased weakness.  His bowel movements and weakness have since resolved and if anything he has some constipation now -MiraLAX as needed -Senna-Colace daily  Increased generalized weakness  Unknown  etiology but this is since resolved.  Acute respiratory distress and acute hypoxic respiratory failure, secondary to multifocal pneumonia in setting of MS and A. fib with RVR resolved and off vasopressors since 12/7 Significant event 12/5 -12/6, hypothermic, in respiratory distress, tachycardia, tachypnea, but was placed on NRB, met sepsis criteria.  Patient was placed on phenylephrine due to hypotension with BP in 70s to 80s. CCM (Dr. Lamonte Sakai) was consulted, patient DNR/DNI after discussion with patient's wife. COVID-19 test negative, BNP 85, CRP 5.8, ferritin 430, D-dimer 0.78 (CTA chest negative for PE but with multifocal pneumonia).  Cortisol level normal. IV Rocephin was discontinued, patient was placed on IV vancomycin and Zosyn.  MRSA negative, narrowed to IV Zosyn which was changed to Augmentin.  (Day 7/10 antibiotics) Leukocytosis yesterday and today improved, likely steroid-induced as he is afebrile -Failed SLP evaluation: Continue nectar thick liquid, needs outpatient follow up at discharge -Continue augmentin for total 10 days antibiotics  Group B strep UTI in setting of recurrent UTIs with history of suspected neurogenic bladder from MS and self catheterizations  Present on admission, currently improved and with Foley -At discharge, patient can continue Foley catheter and follow-up with urology outpatient for consideration of suprapubic catheter -antibiotics As above  Atrial fibrillation with RVR on the night 12/5-12/6, was placed on IV Cardizem drip, turned off due to hypotension.  CHA2DS2-VASc: 1. Now in NSR -hold off on anticoagulation at this time  Acute metabolic encephalopathy due to #1, GBS UTI, and Multiple sclerosis with spastic diplegia and suspected pseudoflare MRI brain without  acute changes.  Has been off his baclofen due to encephalopathy which has since resolved. -Continue reduced baclofen due to reported weakness  Pressure injury Stage I with nonblanchable  redness with small blistered areas to the mid bilateral buttocks, present on admission -Wound care per nursing  Goals of care in the setting of worsening MS, multifocal pneumonia, respiratory failure, aspiration Palliative medicine recently consulted for [place] and patient had changed CODE STATUS to DNR in setting of rapid response as above.   -Discussion with patient and wife at bedside 12/10 who wish to change CODE STATUS to full code now that he has had improved clinical status.  Right eye viral conjunctivitis Resolved with eyedrops  Hypoglycemia resolved with D5W  Hypernatremia resolved with D5W  Neurogenic bladder secondary to MS self catheterizes 5+ times per day.  Has Foley currently -At discharge, patient can continue Foley and follow-up with urology outpatient to discuss suprapubic catheter  Multiple sclerosis likely in pseudoflare given worsening baseline symptoms, unchanged MRI brain and acute infection as above. Follows with Dr. Felecia Shelling, neurology, outpatient.  Not taking his ocrelizumab.  On day 4/5 iv Solu-Medrol -He will complete his IV steroids tomorrow -PT recommending CIR -Needs outpatient follow-up with neurology  DVT prophylaxis: Lovenox Diet: Nectar thick liquid Family Communication: Patient's wife has been updated at bedside Disposition Plan: Pending CIR review.  Medically stable for discharge  Consultants  Palliative care PCCM  Procedures  None  Antibiotics   Anti-infectives (From admission, onward)   Start     Dose/Rate Route Frequency Ordered Stop   12/02/19 2200  amoxicillin-clavulanate (AUGMENTIN) 875-125 MG per tablet 1 tablet     1 tablet Oral Every 12 hours 12/02/19 1406     12/02/19 1500  amoxicillin (AMOXIL) capsule 500 mg  Status:  Discontinued     500 mg Oral Every 8 hours 12/02/19 1403 12/02/19 1406   11/28/19 2300  vancomycin (VANCOCIN) IVPB 750 mg/150 ml premix  Status:  Discontinued     750 mg 150 mL/hr over 60 Minutes Intravenous Every 12  hours 11/28/19 0954 11/28/19 1041   11/28/19 1800  piperacillin-tazobactam (ZOSYN) IVPB 3.375 g  Status:  Discontinued     3.375 g 12.5 mL/hr over 240 Minutes Intravenous Every 8 hours 11/28/19 1122 12/02/19 1403   11/28/19 1400  cefTRIAXone (ROCEPHIN) 1 g in sodium chloride 0.9 % 100 mL IVPB  Status:  Discontinued     1 g 200 mL/hr over 30 Minutes Intravenous Every 24 hours 11/27/19 2133 11/28/19 0914   11/28/19 1100  vancomycin (VANCOCIN) 2,000 mg in sodium chloride 0.9 % 500 mL IVPB  Status:  Discontinued     2,000 mg 250 mL/hr over 120 Minutes Intravenous  Once 11/28/19 0947 11/28/19 1041   11/28/19 1000  ceFEPIme (MAXIPIME) 2 g in sodium chloride 0.9 % 100 mL IVPB  Status:  Discontinued     2 g 200 mL/hr over 30 Minutes Intravenous Every 8 hours 11/28/19 0946 11/28/19 1041   11/27/19 1415  cefTRIAXone (ROCEPHIN) 1 g in sodium chloride 0.9 % 100 mL IVPB     1 g 200 mL/hr over 30 Minutes Intravenous  Once 11/27/19 1406 11/27/19 1520           Subjective   Reports is weakness feels improved however unable to fully assess until he is able to get up and move around.  Apparently, patient had fecal disimpaction yesterday with large hard stool followed by diarrhea soon after.  He reports he has not had  a BM since this.  Otherwise denies any complaints at this time   Objective   Vitals:   12/04/19 1440 12/04/19 1446 12/04/19 2125 12/05/19 0522  BP: 137/78 (!) 150/89 (!) 146/75 (!) 141/93  Pulse: 72 71 74 78  Resp: 15  16 18   Temp:   98 F (36.7 C) 98 F (36.7 C)  TempSrc:      SpO2: 95% 95% 92% 93%  Weight:        Intake/Output Summary (Last 24 hours) at 12/05/2019 1426 Last data filed at 12/05/2019 1003 Gross per 24 hour  Intake 360 ml  Output 1050 ml  Net -690 ml   Filed Weights   11/28/19 0900 11/29/19 0415 12/01/19 0500  Weight: 96.3 kg 97.8 kg 95.9 kg    Examination:  Physical Exam Vitals and nursing note reviewed.  Constitutional:      General: He is not  in acute distress.    Appearance: He is not diaphoretic.  HENT:     Head: Normocephalic and atraumatic.     Nose: Nose normal.     Mouth/Throat:     Mouth: Mucous membranes are moist.  Eyes:     Extraocular Movements: Extraocular movements intact.  Cardiovascular:     Rate and Rhythm: Normal rate and regular rhythm.  Pulmonary:     Effort: Pulmonary effort is normal.     Breath sounds: Normal breath sounds.  Abdominal:     General: Abdomen is flat.     Palpations: Abdomen is soft.     Comments: Hypoactive bowel sounds  Musculoskeletal:        General: No swelling or tenderness.  Neurological:     Mental Status: He is alert. Mental status is at baseline.     Comments: Seems at/near baseline  Psychiatric:        Mood and Affect: Mood normal.        Behavior: Behavior normal.     Data Reviewed: I have personally reviewed following labs and imaging studies  CBC: Recent Labs  Lab 12/01/19 0211 12/02/19 0536 12/03/19 0531 12/04/19 1017 12/05/19 0808  WBC 5.4 4.8 3.3* 14.0* 12.1*  HGB 11.4* 12.3* 12.9* 12.9* 12.5*  HCT 35.3* 37.2* 38.7* 38.1* 37.2*  MCV 95.1 92.3 92.6 92.3 91.2  PLT 127* 160 157 200 027   Basic Metabolic Panel: Recent Labs  Lab 12/01/19 0211 12/02/19 0536 12/03/19 0531 12/04/19 0511 12/05/19 0808  NA 140 142 141 145 144  K 3.7 3.8 4.1 4.1 3.9  CL 110 110 111 111 112*  CO2 21* 22 20* 23 23  GLUCOSE 100* 77 124* 108* 107*  BUN 14 14 14 21  27*  CREATININE 1.00 0.89 0.71 0.76  0.78 0.76  CALCIUM 9.3 9.6 9.5 9.7 9.5  MG  --  1.9  --   --   --    GFR: CrCl cannot be calculated (Unknown ideal weight.). Liver Function Tests: Recent Labs  Lab 11/29/19 0203  AST 47*  ALT 72*  ALKPHOS 78  BILITOT 0.9  PROT 5.7*  ALBUMIN 2.8*   No results for input(s): LIPASE, AMYLASE in the last 168 hours. No results for input(s): AMMONIA in the last 168 hours. Coagulation Profile: No results for input(s): INR, PROTIME in the last 168 hours. Cardiac  Enzymes: No results for input(s): CKTOTAL, CKMB, CKMBINDEX, TROPONINI in the last 168 hours. BNP (last 3 results) No results for input(s): PROBNP in the last 8760 hours. HbA1C: No results for input(s): HGBA1C in the  last 72 hours. CBG: Recent Labs  Lab 12/01/19 0004 12/01/19 0409 12/01/19 0746 12/01/19 1251 12/01/19 1608  GLUCAP 112* 88 93 74 118*   Lipid Profile: No results for input(s): CHOL, HDL, LDLCALC, TRIG, CHOLHDL, LDLDIRECT in the last 72 hours. Thyroid Function Tests: No results for input(s): TSH, T4TOTAL, FREET4, T3FREE, THYROIDAB in the last 72 hours. Anemia Panel: No results for input(s): VITAMINB12, FOLATE, FERRITIN, TIBC, IRON, RETICCTPCT in the last 72 hours. Sepsis Labs: No results for input(s): PROCALCITON, LATICACIDVEN in the last 168 hours.  Recent Results (from the past 240 hour(s))  SARS CORONAVIRUS 2 (TAT 6-24 HRS) Nasopharyngeal Nasopharyngeal Swab     Status: None   Collection Time: 11/27/19  7:51 AM   Specimen: Nasopharyngeal Swab  Result Value Ref Range Status   SARS Coronavirus 2 NEGATIVE NEGATIVE Final    Comment: (NOTE) SARS-CoV-2 target nucleic acids are NOT DETECTED. The SARS-CoV-2 RNA is generally detectable in upper and lower respiratory specimens during the acute phase of infection. Negative results do not preclude SARS-CoV-2 infection, do not rule out co-infections with other pathogens, and should not be used as the sole basis for treatment or other patient management decisions. Negative results must be combined with clinical observations, patient history, and epidemiological information. The expected result is Negative. Fact Sheet for Patients: SugarRoll.be Fact Sheet for Healthcare Providers: https://www.woods-mathews.com/ This test is not yet approved or cleared by the [place] FDA and  has been authorized for detection and/or diagnosis of SARS-CoV-2 by FDA under an Emergency Use  Authorization (EUA). This EUA will remain  in effect (meaning this test can be used) for the duration of the COVID-19 declaration under Section 56 4(b)(1) of the Act, 21 U.S.C. section 360bbb-3(b)(1), unless the authorization is terminated or revoked sooner. Performed at [facility], [place] [address], [place], [place] [address]   Urine culture     Status: Abnormal   Collection Time: 11/27/19  9:49 AM   Specimen: Urine, Clean Catch  Result Value Ref Range Status   Specimen Description   Final    URINE, CLEAN CATCH Performed at [facility], [place] [address] [place], [place] [address]    Special Requests   Final    NONE Performed at [facility], [place] [address] [place], [place] [address]    Culture (A)  Final    >=100,000 COLONIES/mL GROUP B STREP(S.AGALACTIAE)ISOLATED TESTING AGAINST S. AGALACTIAE NOT ROUTINELY PERFORMED DUE TO PREDICTABILITY OF AMP/PEN/VAN SUSCEPTIBILITY. Performed at [facility], [place] [address], [place], [place] [address]    Report Status 11/28/2019 FINAL  Final  Culture, blood (routine x 2)     Status: None   Collection Time: 11/27/19  6:17 PM   Specimen: BLOOD  Result Value Ref Range Status   Specimen Description   Final    BLOOD RIGHT ANTECUBITAL Performed at [place] [address], [place], [place] [address]    Special Requests   Final    BOTTLES DRAWN AEROBIC AND ANAEROBIC Blood Culture adequate volume Performed at [place] [address], [place], [place] [address]    Culture   Final    NO GROWTH 5 DAYS Performed at [facility], [place] [address] [place],  [address]    Report Status 12/02/2019 FINAL  Final  Culture, blood (routine x 2)     Status: None   Collection Time: 11/27/19  6:17 PM   Specimen: BLOOD  Result Value  Ref Range Status   Specimen Description   Final    BLOOD RIGHT HAND Performed at [place] [address] [place], [place] [address]    Special Requests   Final    BOTTLES DRAWN AEROBIC AND ANAEROBIC Blood Culture adequate volume Performed at [place] [address], [place], [place] [address]    Culture   Final    NO GROWTH 5 DAYS Performed at [facility], [place] [address] [place], [place] [address]    Report Status 12/02/2019 FINAL  Final  MRSA PCR Screening     Status: None   Collection Time: 11/27/19 11:25 PM   Specimen: Nasal Mucosa; Nasopharyngeal  Result Value Ref Range Status   MRSA by PCR NEGATIVE NEGATIVE Final    Comment:        The GeneXpert MRSA Assay (FDA approved for NASAL specimens only), is one component of a comprehensive MRSA colonization surveillance program. It is not intended to diagnose MRSA infection nor to guide or monitor treatment for MRSA infections. Performed at [facility], [place] [address], [place], [place] [address]          Radiology Studies: No results found.      Scheduled Meds: . amoxicillin-clavulanate  1 tablet Oral Q12H  . baclofen  5 mg Oral TID  . chlorhexidine  15 mL Mouth Rinse BID  . Chlorhexidine Gluconate Cloth  6 each Topical Q0600  . enoxaparin (LOVENOX) injection  40 mg Subcutaneous Q24H  . erythromycin  1 application Right Eye QHS  . mouth rinse  15 mL Mouth Rinse q12n4p  . pantoprazole  40 mg Oral QHS  . polyvinyl alcohol  1 drop Right Eye TID  . senna-docusate  1 tablet Oral QHS   Continuous Infusions: . sodium chloride 10 mL/hr at 12/01/19 1800  . methylPREDNISolone (SOLU-MEDROL) injection 1,000 mg (12/05/19 0950)     LOS: 8 days    Time spent: 30 minutes with over 50% of the time coordinating the patient's care    Harold Hedge, DO [facility] Pager [phone]  If 7PM-7AM, please contact night-coverage [email or web address] Password [email or web address] 12/05/2019, 2:26 PM

## 2019-12-05 NOTE — Evaluation (Signed)
Occupational Therapy Evaluation Patient Details Name: Stephen Wade MRN: [id number] DOB: [birth year] Today's Date: 12/05/2019    History of Present Illness 68 year old male with past medical history of MS (not taking his medication on a wheelchair-bound at baseline and with baseline R>L weakness), GERD, recurrent UTIs and self-catheterization for neurogenic bladder and admitted for Acute hypoxic respiratory failure, secondary to multifocal pneumonia in setting of MS and A. fib with RVR and UTI   Clinical Impression   Pt admitted with the above diagnoses and presents with below problem list. Pt will benefit from continued acute OT to address the below listed deficits and maximize independence with basic ADLs prior to d/c to venue below. At baseline, pt able to transfer into electric scooter without physical A. Some assist needed with ADLs. Pt presents with decreased strength and sitting balance. Currently +2 min to max A needed to sit EOB. Sat EOB several minutes working on trunk control. Difficulty maintaining midline posture. Spouse present throughout session and encouraging pt. Feel he would benefit from intensive rehab program prior to returning home.      Follow Up Recommendations  CIR    Equipment Recommendations  Other (comment)(defer to next venue)    Recommendations for Other Services       Precautions / Restrictions Precautions Precautions: Fall Precaution Comments: hx MS (R LE deficits > L LE, spastic diplegia) Restrictions Weight Bearing Restrictions: No      Mobility Bed Mobility Overal bed mobility: Needs Assistance Bed Mobility: Supine to Sit;Sit to Supine     Supine to sit: Max assist;Total assist;+2 for physical assistance;HOB elevated Sit to supine: Total assist;+2 for physical assistance   General bed mobility comments: Total A to advance BLE across mattress, max-total A to powerup trunk Pt attempting to use BUE to facilitate bed mobility.   Transfers                  General transfer comment: not tested for safety    Balance Overall balance assessment: Needs assistance Sitting-balance support: Bilateral upper extremity supported;Single extremity supported;Feet supported Sitting balance-Leahy Scale: Poor Sitting balance - Comments: sat EOB several mintues at min to max A level. Difficulty maintaining trunk control. A few posterior LOB requiring assist to steady.                                    ADL either performed or assessed with clinical judgement   ADL Overall ADL's : Needs assistance/impaired Eating/Feeding: Minimal assistance;Set up;Bed level   Grooming: Moderate assistance;Bed level   Upper Body Bathing: Sitting;Maximal assistance Upper Body Bathing Details (indicate cue type and reason): able to sit EOB with BUE support and min to max A Lower Body Bathing: Total assistance;+2 for physical assistance;Bed level;Sitting/lateral leans   Upper Body Dressing : Maximal assistance;Sitting Upper Body Dressing Details (indicate cue type and reason): able to sit EOB with BUE support and min to max A Lower Body Dressing: Total assistance;Sitting/lateral leans;Bed level;+2 for physical assistance                 General ADL Comments: Pt completed bed mobility with +2 max - total A, sat EOB several minutes with min to mod A +2 s/e, physical A. Pt able to facilitate scooting towards HOB (towards stronger side).      Vision Baseline Vision/History: Wears glasses Wears Glasses: At all times       Perception  Praxis      Pertinent Vitals/Pain Pain Assessment: Faces Faces Pain Scale: Hurts even more Pain Location: grimacing with bed mobility, BLE Pain Descriptors / Indicators: Grimacing Pain Intervention(s): Monitored during session;Repositioned     Hand Dominance     Extremity/Trunk Assessment Upper Extremity Assessment Upper Extremity Assessment: RUE deficits/detail;LUE deficits/detail RUE  Deficits / Details: keeps RUE in flexion position at rest but able to extend elbow and use minimally during bed mobility/sitting EOB. limited shoulder flexion/ABD. Impaired gross and fine motor coordination RUE Coordination: decreased fine motor;decreased gross motor LUE Deficits / Details: generalized weakness but able to use functionally more then RUE.  LUE Coordination: decreased fine motor;decreased gross motor   Lower Extremity Assessment Lower Extremity Assessment: Defer to PT evaluation       Communication Communication Communication: Expressive difficulties   Cognition Arousal/Alertness: Awake/alert Behavior During Therapy: WFL for tasks assessed/performed Overall Cognitive Status: Within Functional Limits for tasks assessed                                     General Comments  Spouse present and encouraging pt.     Exercises     Shoulder Instructions      Home Living Family/patient expects to be discharged to:: Private residence Living Arrangements: Spouse/significant other Available Help at Discharge: Family Type of Home: House Home Access: Ramped entrance     Home Layout: Able to live on main level with bedroom/bathroom               Home Equipment: Art gallery manager;Wheelchair - Engineer, technical sales - power;Hospital bed   Additional Comments: sleeps in lazyboy lift chair      Prior Functioning/Environment Level of Independence: Needs assistance    ADL's / Homemaking Assistance Needed: aide typically assists with ADLs however aide out with (+) with covid-19 currently per pt            OT Problem List: Decreased strength;Decreased range of motion;Decreased activity tolerance;Impaired balance (sitting and/or standing);Decreased knowledge of use of DME or AE;Decreased knowledge of precautions;Impaired UE functional use;Pain;Impaired tone      OT Treatment/Interventions: Self-care/ADL training;Therapeutic exercise;Neuromuscular  education;DME and/or AE instruction;Therapeutic activities;Patient/family education;Balance training;Energy conservation    OT Goals(Current goals can be found in the care plan section) Acute Rehab OT Goals Patient Stated Goal: home OT Goal Formulation: With patient/family Time For Goal Achievement: 12/19/19 Potential to Achieve Goals: Good ADL Goals Pt Will Perform Grooming: with min assist;sitting Pt Will Transfer to Toilet: with min assist;bedside commode;with transfer board Pt/caregiver will Perform Home Exercise Program: Increased strength;Both right and left upper extremity;With Supervision Additional ADL Goal #1: Pt will sit EOB 5 minutes at min A level to prepare for EOB ADLs/OOB transfer.  OT Frequency: Min 3X/week   Barriers to D/C:            Co-evaluation              AM-PAC OT "6 Clicks" Daily Activity     Outcome Measure Help from another person eating meals?: A Little Help from another person taking care of personal grooming?: A Lot Help from another person toileting, which includes using toliet, bedpan, or urinal?: Total Help from another person bathing (including washing, rinsing, drying)?: Total Help from another person to put on and taking off regular upper body clothing?: A Lot Help from another person to put on and taking off regular lower body clothing?: Total 6 Click  Score: 10   End of Session    Activity Tolerance: Patient tolerated treatment well Patient left: in bed;with call bell/phone within reach;with bed alarm set;with family/visitor present  OT Visit Diagnosis: Muscle weakness (generalized) (M62.81);Other abnormalities of gait and mobility (R26.89);Pain                Time: 6144-3154 OT Time Calculation (min): 35 min Charges:  OT General Charges $OT Visit: 1 Visit OT Evaluation $OT Eval Moderate Complexity: 1 Mod OT Treatments $Self Care/Home Management : 8-22 mins  Raynald Kemp, OT Acute Rehabilitation Services Pager:  [phone] Office: [phone]   Pilar Grammes 12/05/2019, 2:10 PM

## 2019-12-06 LAB — BASIC METABOLIC PANEL
Anion gap: 8 (ref 5–15)
BUN: 31 mg/dL — ABNORMAL HIGH (ref 8–23)
CO2: 23 mmol/L (ref 22–32)
Calcium: 9.2 mg/dL (ref 8.9–10.3)
Chloride: 114 mmol/L — ABNORMAL HIGH (ref 98–111)
Creatinine, Ser: 0.76 mg/dL (ref 0.61–1.24)
GFR calc Af Amer: >60 mL/min (ref >60)
GFR calc non Af Amer: >60 mL/min (ref >60)
Glucose, Bld: 110 mg/dL — ABNORMAL HIGH (ref 70–99)
Potassium: 4 mmol/L (ref 3.5–5.1)
Sodium: 145 mmol/L (ref 135–145)

## 2019-12-06 LAB — CBC
HCT: 36.7 % — ABNORMAL LOW (ref 39.0–52.0)
Hemoglobin: 11.9 g/dL — ABNORMAL LOW (ref 13.0–17.0)
MCH: 30.7 pg (ref 26.0–34.0)
MCHC: 32.4 g/dL (ref 30.0–36.0)
MCV: 94.6 fL (ref 80.0–100.0)
Platelets: 222 10*3/uL (ref 150–400)
RBC: 3.88 MIL/uL — ABNORMAL LOW (ref 4.22–5.81)
RDW: 16.2 % — ABNORMAL HIGH (ref 11.5–15.5)
WBC: 10.6 10*3/uL — ABNORMAL HIGH (ref 4.0–10.5)
nRBC: 0 % (ref 0.0–0.2)

## 2019-12-06 MED ORDER — ENSURE ENLIVE PO LIQD
237.0000 mL | Freq: Three times a day (TID) | ORAL | Status: DC
  Administered 2019-12-07 (×2): 237 mL via ORAL

## 2019-12-06 NOTE — Progress Notes (Signed)
While rounding floor, I met Stephen Wade and his wife Magda Paganini who was at bedside. Stephen Wade ( short to talk), voiced his desire to go home. Wife reaffirmed and stated they were waiting on updates on possible discharge soon. Wife stated that they are still working on getting help to assist [place] when he returns home. Also, she said that they might benefit better if they go to physical therapy at [facility] I suggested they let staff know of the their desires. I was pastorally present, and offered words of comfort. I offered him space to state some questions and concerns he may have and he said he has no complaints. Stephen Wade said  he was very thankful for the staff care he has received.   Chaplain resident Fidel Levy [phone]

## 2019-12-06 NOTE — Progress Notes (Addendum)
Speech Language Pathology Treatment: Dysphagia  Patient Details Name: Stephen Wade MRN: [id number] DOB: June 23, 1951 Today's Date: 12/06/2019 Time: 1443-1540 SLP Time Calculation (min) (ACUTE ONLY): 41 min  Assessment / Plan / Recommendation Clinical Impression  Visit today to see pt to determine readiness for dietary advancement, address increased risk of aspiration with thinner consistencies and solids requiring mastication, assess tolerance of po intake and modify diet as needed.  Pt today with stronger voice but he is continuing to aspirate secretions based on his wet/gurgly voice.  SLP observed pt consuming a variety of items including thin water, Ensure, pudding and icecream.    Chin tuck posture with thin water did NOT prevent aspiration, pt overtly coughed with 2/7 boluses with thin water- indicative of aspiration based on his MBS.  Fewer indications of aspiration with Ensure liquids observed but slight cough noted x2/8 boluses.  Pt and wife report significant concerns with pt poor intake - stating he is not drinking or eating hardly anything. He reports he will not drink the thickened liquids.    SLP provided pt with toothbrush/paste and he brushed his teeth - reeducated re: importance of brushing dentition due to ongoing secretion aspiration.    SLP reviewed MBS from last week with pt and his wife - reviewing his aspiration of thin with cough but without ability to clear.  SLP also reviewed pt's increased asp pna risk due to his MS causing weak cough & voice preventing clearance of aspirates (of even secretions) and decreased ambulatory status.    Pt has been experiencing coughing with eating/drinking for several months prior to admission per pt's wife. Informed to increased aspiration risk with advancement of diet including respiratory compromise, airway closure, recurrent pna with mod I.  Pt benefits from min verbal cues to take small single boluses.   Compromise of Dys1/extra  gravy/sauces with Ensure,V8 or tomato juice with meals and allow of thin water between meals advised to maximize hydration with mitigation of risks.    SlP messaged MD and received approval for diet "as long as pt understands risk" to which both pt and spouse confirmed.  Advised pt/wife again that purpose of diet is to mitigate NOT PREVENT aspiration using teach back.    Pt will benefit from RMST trial to strengthen cough/voice and thus airway protection with intake. SLP will continue to follow for dysphagia management.   HPI HPI: 68 yo male adm tto [facility] with sepsis - recurrent UTIs for which he self caths with AMS = Pt w/ MS, GERD,  = after receiving Ativan he demonstrated decreased mentation - 12/5-12/6 pt became tachycardia ? Afib, hypotensive  and was transferred to SDU.  Is now on oxygen and in ICU.  CXR showed possible progressive RLL pna.  Pt MRI showed demyelinating process which is not significantly worse compared to 2018 MRI.  He demonstrates poor airway protection of secretions today with gross dysarthria.  Pt reports he has h/o poor intake recently with increased difficulties swallowing compared to management of secretions.  Also admits to taste changes.  Swallow eval ordered.      SLP Plan  Continue with current plan of care       Recommendations  Diet recommendations: Dysphagia 1 (puree);Nectar-thick liquid(thin water ok between meals, Ensure,V8, Tomato juice with meals) Liquids provided via: Cup;Straw Medication Administration: (with pudding) Supervision: Patient able to self feed;Staff to assist with self feeding Compensations: Slow rate;Small sips/bites;Effortful swallow(oral suction before, during and after intake, intermittent dry swallow) Postural Changes and/or Swallow  Maneuvers: Seated upright 90 degrees;Upright 30-60 min after meal                Oral Care Recommendations: Oral care QID Follow up Recommendations: Inpatient Rehab SLP Visit Diagnosis: Dysphagia,  oropharyngeal phase (R13.12) Plan: Continue with current plan of care       GO                Chales Abrahams 12/06/2019, 6:20 PM   Rolena Infante, MS [facility] SLP Acute Rehab Services Office [phone]

## 2019-12-06 NOTE — Progress Notes (Signed)
Pt's rectal temp on recheck is at 94.5. Pt refusing to wear blankets and requests door to stay open to hallway. Pt educated about the need to bring temp up, and verbalizes understanding. Agreed to heat packs applied around body. Pt states he feels the need to have BM but hasn't had one in a few days. Refuses miralax. Will continue to monitor.

## 2019-12-06 NOTE — Progress Notes (Signed)
Physical Therapy Treatment Patient Details Name: Stephen Wade MRN: [id number] DOB: 05-02-1951 Today's Date: 12/06/2019    History of Present Illness 68 year old male with past medical history of MS (not taking his medication on a wheelchair-bound at baseline and with baseline R>L weakness), GERD, recurrent UTIs and self-catheterization for neurogenic bladder and admitted for Acute hypoxic respiratory failure, secondary to multifocal pneumonia in setting of MS and A. fib with RVR and UTI    PT Comments    Pt, spouse and doctor present upon arrival discussing CIR vs. Home. Pt tearful and questioning CIR due to poor experience in SNF; therapist educated pt on the difference in settings and benefits of CIR to improve functional status. Pt agreeable to treatment after much deliberation and with spouse encouragement. Pt requires max assist x2 to come to seated EOB and able to maintain sitting for 15 minutes with varying assistance, max assist to contact guard assist. Pt with loss of balance posterior while seated requiring assist from therapist to recover upright normal postural alignment in sitting. Pt requires max assist x2 to return to supine and to reposition in bed with pt attempting to help with BUE on bedrails or holding wife's hand. Pt's wife present and assisting therapist with treatment due to being pt's caregiver at home and agree pt is much weaker than his baseline. Pt progressing toward PT goals. Pt would benefit from CIR. Will continue to follow in acute setting.    Follow Up Recommendations  CIR     Equipment Recommendations  None recommended by PT;Other (comment)(if home Hoyer lift )    Recommendations for Other Services       Precautions / Restrictions Precautions Precautions: Fall Precaution Comments: hx MS (R LE deficits > L LE, spastic diplegia) Restrictions Weight Bearing Restrictions: No    Mobility  Bed Mobility Overal bed mobility: Needs Assistance Bed Mobility:  Supine to Sit;Sit to Supine     Supine to sit: Max assist;+2 for physical assistance;HOB elevated Sit to supine: Total assist;+2 for physical assistance   General bed mobility comments: assist for BLE off bed and uprighting trunk, pt attempts to assist by reaching for bedrails and pulling on spouse's hand  Transfers                    Ambulation/Gait                 Stairs             Wheelchair Mobility    Modified Rankin (Stroke Patients Only)       Balance   Sitting-balance support: Bilateral upper extremity supported;Single extremity supported;Feet supported Sitting balance-Leahy Scale: Poor Sitting balance - Comments: seated EOB x15 minutes, use of BUE on bed and therapist physically assisting to maintain normal postural alignment, overall max A with moments of 5-10 sec increments of contact guard assist, loss of balance backwards and to left requiring assist from therapist to recover normal postural alignment             Cognition Arousal/Alertness: Awake/alert Behavior During Therapy: WFL for tasks assessed/performed Overall Cognitive Status: Within Functional Limits for tasks assessed                 Exercises      General Comments        Pertinent Vitals/Pain Pain Assessment: No/denies pain    Home Living           Prior Function  PT Goals (current goals can now be found in the care plan section) Acute Rehab PT Goals PT Goal Formulation: With patient Time For Goal Achievement: 12/16/19 Potential to Achieve Goals: Good Progress towards PT goals: Progressing toward goals    Frequency    Min 3X/week      PT Plan Current plan remains appropriate    Co-evaluation              AM-PAC PT "6 Clicks" Mobility   Outcome Measure  Help needed turning from your back to your side while in a flat bed without using bedrails?: Total Help needed moving from lying on your back to sitting on the side of a  flat bed without using bedrails?: Total Help needed moving to and from a bed to a chair (including a wheelchair)?: Total Help needed standing up from a chair using your arms (e.g., wheelchair or bedside chair)?: Total Help needed to walk in hospital room?: Total Help needed climbing 3-5 steps with a railing? : Total 6 Click Score: 6    End of Session   Activity Tolerance: Patient tolerated treatment well Patient left: in bed;with call bell/phone within reach;with bed alarm set;with family/visitor present Nurse Communication: Mobility status PT Visit Diagnosis: Other abnormalities of gait and mobility (R26.89)     Time: 1510-1550 PT Time Calculation (min) (ACUTE ONLY): 40 min  Charges:  $Therapeutic Activity: 38-52 mins                     Tori Ciena Sampley PT, DPT 12/06/19, 4:26 PM [phone]

## 2019-12-06 NOTE — Progress Notes (Signed)
NT unable to get an accurate reading for temp orally or axillary. This RN was able to get rectal temp of 94.0. MD on call notified. All other VS stable at this time. Will repeat rectal temp in 1 hour after applying warm blankets.

## 2019-12-06 NOTE — Care Management Important Message (Signed)
Important Message  Patient Details IM Letter given to [place] Case Manager to present to the Patient Name: Stephen Wade MRN: [id number] Date of Birth: [birth year]   Medicare Important Message Given:  Yes     Kerin Salen 12/06/2019, 2:57 PM

## 2019-12-06 NOTE — Progress Notes (Addendum)
PROGRESS NOTE    Stephen Wade    Code Status: Full Code  VHQ:[id number] DOB: Jul 24, 1951 DOA: 11/27/2019  PCP: Colon Branch, MD    Hospital Summary  This is a 68 year old male with past medical history of MS (not taking his medication on a wheelchair-bound at baseline and with baseline R>L weakness), GERD, recurrent UTIs and self-catheterization for neurogenic bladder who presented to the ED via EMS for concern of worsening weakness confusion the last 1 week prior to arrival as well as increased weakness and difficulty with transfers.  Patient was noted to be drowsy in the ED after receiving Ativan prior to MRI brain.  Home health aide had tested positive for COVID-19 1 month ago, patient tested negative x2.  12/5-6: Found to be in respiratory distress with increased O2 demands, tachycardia, hypothermia, rapid response was called.  EKG showing atrial fibrillation with RVR and heart rates 120s to 140s.  He was started on IV Cardizem and required nonrebreather mask.  Bear hugger for hypothermia.  ABG: pH 7.3, PCO2 43, PO2 86, O2 96 CXR with new right lower lobe infiltrate concerning for aspiration pneumonia.  PCCM was consulted and patient was placed on phenylephrine due to hypotension (discontinued 12/7).  12/9: Transferred from stepdown to med telemetry  PT/OT recommending CIR, currently awaiting placement  A & P   Principal Problem:   Acute respiratory failure with hypoxia ([place]) Active Problems:   GERD   BPH - self caths   Spastic diplegia (HCC)   Multiple sclerosis (HCC)   UTI (urinary tract infection)   Acute metabolic encephalopathy   Pneumonia   Atrial fibrillation with RVR (HCC)  Diarrhea, no constipation Improved -MiraLAX as needed -Senna-Colace daily  Generalized weakness secondary to debility and MS -PT recommending CIR.  Prolonged discussion with patient and wife and PT, they are agreeing to CIR  Acute respiratory distress and acute hypoxic respiratory failure,  secondary to multifocal pneumonia in setting of MS and A. fib with RVR resolved and off vasopressors since 12/7 Significant event 12/5 -12/6, hypothermic, in respiratory distress, tachycardia, tachypnea, but was placed on NRB, met sepsis criteria.  Patient was placed on phenylephrine due to hypotension with BP in 70s to 80s. CCM (Dr. Lamonte Sakai) was consulted, patient DNR/DNI after discussion with patient's wife. COVID-19 test negative, BNP 85, CRP 5.8, ferritin 430, D-dimer 0.78 (CTA chest negative for PE but with multifocal pneumonia).  Cortisol level normal. IV Rocephin was discontinued, patient was placed on IV vancomycin and Zosyn.  MRSA negative, narrowed to IV Zosyn which was changed to Augmentin.  (Day 8/10 antibiotics). Leukocytosis improved. Leukocytosis yesterday and today improved, likely steroid-induced as he is afebrile Failed SLP evaluation last week -Continue augmentin for total 10 days antibiotics -Appreciate SLP reevaluation today and DC recommendations  Group B strep UTI in setting of recurrent UTIs with history of suspected neurogenic bladder from MS and self catheterizations  Present on admission, currently improved and with Foley -At discharge, patient can continue Foley catheter and follow-up with urology outpatient for consideration of suprapubic catheter -antibiotics As above  Atrial fibrillation with RVR on the night 12/5-12/6, was placed on IV Cardizem drip, turned off due to hypotension.  CHA2DS2-VASc: 1. Now in NSR -hold off on anticoagulation at this time  Acute metabolic encephalopathy due to #1, GBS UTI, and Multiple sclerosis with spastic diplegia and suspected pseudoflare MRI brain without acute changes.  Initially off baclofen which was reinstated at a lower dose tolerating -Continue reduced baclofen  Pressure injury Stage I with nonblanchable redness with small blistered areas to the mid bilateral buttocks, present on admission -Wound care per  nursing  Goals of care in the setting of worsening MS, multifocal pneumonia, respiratory failure, aspiration Palliative medicine recently consulted for [place] and patient had changed CODE STATUS to DNR in setting of rapid response as above.   -Discussion with patient and wife at bedside 12/10 who wish to change CODE STATUS to full code now that he has had improved clinical status.  Right eye viral conjunctivitis Resolved with eyedrops  Hypoglycemia resolved with D5W  Hypernatremia resolved with D5W  Neurogenic bladder secondary to MS self catheterizes 5+ times per day.  Has Foley currently -At discharge, patient can continue Foley and follow-up with urology outpatient to discuss suprapubic catheter  Multiple sclerosis likely in pseudoflare given worsening baseline symptoms, unchanged MRI brain and acute infection as above. Follows with Dr. Felecia Shelling, neurology, outpatient.  Not taking his ocrelizumab.  On day 5/5 iv Solu-Medrol -Completed steroids -PT recommending CIR -Needs outpatient follow-up with neurology  DVT prophylaxis: Lovenox Diet: Per SLP Family Communication: Patient's wife has been updated at bedside Disposition Plan: Pending CIR review.  Medically stable for discharge  Consultants  Palliative care PCCM PT SLP  Procedures  None  Antibiotics   Anti-infectives (From admission, onward)   Start     Dose/Rate Route Frequency Ordered Stop   12/02/19 2200  amoxicillin-clavulanate (AUGMENTIN) 875-125 MG per tablet 1 tablet     1 tablet Oral Every 12 hours 12/02/19 1406     12/02/19 1500  amoxicillin (AMOXIL) capsule 500 mg  Status:  Discontinued     500 mg Oral Every 8 hours 12/02/19 1403 12/02/19 1406   11/28/19 2300  vancomycin (VANCOCIN) IVPB 750 mg/150 ml premix  Status:  Discontinued     750 mg 150 mL/hr over 60 Minutes Intravenous Every 12 hours 11/28/19 0954 11/28/19 1041   11/28/19 1800  piperacillin-tazobactam (ZOSYN) IVPB 3.375 g  Status:  Discontinued      3.375 g 12.5 mL/hr over 240 Minutes Intravenous Every 8 hours 11/28/19 1122 12/02/19 1403   11/28/19 1400  cefTRIAXone (ROCEPHIN) 1 g in sodium chloride 0.9 % 100 mL IVPB  Status:  Discontinued     1 g 200 mL/hr over 30 Minutes Intravenous Every 24 hours 11/27/19 2133 11/28/19 0914   11/28/19 1100  vancomycin (VANCOCIN) 2,000 mg in sodium chloride 0.9 % 500 mL IVPB  Status:  Discontinued     2,000 mg 250 mL/hr over 120 Minutes Intravenous  Once 11/28/19 0947 11/28/19 1041   11/28/19 1000  ceFEPIme (MAXIPIME) 2 g in sodium chloride 0.9 % 100 mL IVPB  Status:  Discontinued     2 g 200 mL/hr over 30 Minutes Intravenous Every 8 hours 11/28/19 0946 11/28/19 1041   11/27/19 1415  cefTRIAXone (ROCEPHIN) 1 g in sodium chloride 0.9 % 100 mL IVPB     1 g 200 mL/hr over 30 Minutes Intravenous  Once 11/27/19 1406 11/27/19 1520           Subjective   Long discussion today with patient and wife at bedside.  Patient initially very adamant about going home with home health as he is sick of being in the hospital.  Wife continue to recommend and support CIR decision at discharge as she knows he will not be able to get enough PT at home for best benefit.  A long discussion today and agreed to be reevaluated today by PT.  Physical therapy was asked to reevaluate patient today and continue to recommend CIR.  Patient and wife agreed that that was the best course of action.  I asked SLP to reevaluate the patient as he is clinically improved and is hoping to advance his diet.  Despite these complaints, he denies any other complaints like chest pain, shortness of breath, nausea, vomiting.  Has had a bowel movement. No other complaints   Objective   Vitals:   12/05/19 1504 12/05/19 2122 12/06/19 0615 12/06/19 1337  BP: 138/84 (!) 141/77 140/86 (!) 143/78  Pulse: 70 68 69 63  Resp: 16 18 18 18   Temp:  98.1 F (36.7 C) 98 F (36.7 C)   TempSrc:      SpO2: 97% 95% 95% 95%  Weight:        Intake/Output  Summary (Last 24 hours) at 12/06/2019 1702 Last data filed at 12/06/2019 1004 Gross per 24 hour  Intake 1733 ml  Output 1000 ml  Net 733 ml   Filed Weights   11/28/19 0900 11/29/19 0415 12/01/19 0500  Weight: 96.3 kg 97.8 kg 95.9 kg    Examination:  Physical Exam Vitals and nursing note reviewed.  Constitutional:      General: He is not in acute distress.    Appearance: He is not diaphoretic.  HENT:     Head: Normocephalic and atraumatic.     Nose: Nose normal.     Mouth/Throat:     Mouth: Mucous membranes are moist.  Eyes:     Extraocular Movements: Extraocular movements intact.  Cardiovascular:     Rate and Rhythm: Normal rate and regular rhythm.  Pulmonary:     Effort: Pulmonary effort is normal.     Breath sounds: Normal breath sounds.  Abdominal:     General: Abdomen is flat.     Palpations: Abdomen is soft.     Comments: Hypoactive bowel sounds  Musculoskeletal:        General: No swelling or tenderness.  Neurological:     Mental Status: He is alert. Mental status is at baseline.     Comments: Seems at/near baseline  Psychiatric:        Mood and Affect: Mood normal.        Behavior: Behavior normal.     Comments: frustrated     Data Reviewed: I have personally reviewed following labs and imaging studies  CBC: Recent Labs  Lab 12/02/19 0536 12/03/19 0531 12/04/19 1017 12/05/19 0808 12/06/19 0513  WBC 4.8 3.3* 14.0* 12.1* 10.6*  HGB 12.3* 12.9* 12.9* 12.5* 11.9*  HCT 37.2* 38.7* 38.1* 37.2* 36.7*  MCV 92.3 92.6 92.3 91.2 94.6  PLT 160 157 200 235 027   Basic Metabolic Panel: Recent Labs  Lab 12/02/19 0536 12/03/19 0531 12/04/19 0511 12/05/19 0808 12/06/19 0513  NA 142 141 145 144 145  K 3.8 4.1 4.1 3.9 4.0  CL 110 111 111 112* 114*  CO2 22 20* 23 23 23   GLUCOSE 77 124* 108* 107* 110*  BUN 14 14 21  27* 31*  CREATININE 0.89 0.71 0.76  0.78 0.76 0.76  CALCIUM 9.6 9.5 9.7 9.5 9.2  MG 1.9  --   --   --   --    GFR: CrCl cannot be  calculated (Unknown ideal weight.). Liver Function Tests: No results for input(s): AST, ALT, ALKPHOS, BILITOT, PROT, ALBUMIN in the last 168 hours. No results for input(s): LIPASE, AMYLASE in the last 168 hours. No results for input(s): AMMONIA in  the last 168 hours. Coagulation Profile: No results for input(s): INR, PROTIME in the last 168 hours. Cardiac Enzymes: No results for input(s): CKTOTAL, CKMB, CKMBINDEX, TROPONINI in the last 168 hours. BNP (last 3 results) No results for input(s): PROBNP in the last 8760 hours. HbA1C: No results for input(s): HGBA1C in the last 72 hours. CBG: Recent Labs  Lab 12/01/19 0004 12/01/19 0409 12/01/19 0746 12/01/19 1251 12/01/19 1608  GLUCAP 112* 88 93 74 118*   Lipid Profile: No results for input(s): CHOL, HDL, LDLCALC, TRIG, CHOLHDL, LDLDIRECT in the last 72 hours. Thyroid Function Tests: No results for input(s): TSH, T4TOTAL, FREET4, T3FREE, THYROIDAB in the last 72 hours. Anemia Panel: No results for input(s): VITAMINB12, FOLATE, FERRITIN, TIBC, IRON, RETICCTPCT in the last 72 hours. Sepsis Labs: No results for input(s): PROCALCITON, LATICACIDVEN in the last 168 hours.  Recent Results (from the past 240 hour(s))  SARS CORONAVIRUS 2 (TAT 6-24 HRS) Nasopharyngeal Nasopharyngeal Swab     Status: None   Collection Time: 11/27/19  7:51 AM   Specimen: Nasopharyngeal Swab  Result Value Ref Range Status   SARS Coronavirus 2 NEGATIVE NEGATIVE Final    Comment: (NOTE) SARS-CoV-2 target nucleic acids are NOT DETECTED. The SARS-CoV-2 RNA is generally detectable in upper and lower respiratory specimens during the acute phase of infection. Negative results do not preclude SARS-CoV-2 infection, do not rule out co-infections with other pathogens, and should not be used as the sole basis for treatment or other patient management decisions. Negative results must be combined with clinical observations, patient history, and epidemiological  information. The expected result is Negative. Fact Sheet for Patients: SugarRoll.be Fact Sheet for Healthcare Providers: https://www.woods-mathews.com/ This test is not yet approved or cleared by the [place] FDA and  has been authorized for detection and/or diagnosis of SARS-CoV-2 by FDA under an Emergency Use Authorization (EUA). This EUA will remain  in effect (meaning this test can be used) for the duration of the COVID-19 declaration under Section 56 4(b)(1) of the Act, 21 U.S.C. section 360bbb-3(b)(1), unless the authorization is terminated or revoked sooner. Performed at [facility], [place] [address], [place], [place] [address]   Urine culture     Status: Abnormal   Collection Time: 11/27/19  9:49 AM   Specimen: Urine, Clean Catch  Result Value Ref Range Status   Specimen Description   Final    URINE, CLEAN CATCH Performed at [facility], [place] [address], [place], [place] [address]    Special Requests   Final    NONE Performed at [facility], [place] [address] [place], [place] [address]    Culture (A)  Final    >=100,000 COLONIES/mL GROUP B STREP(S.AGALACTIAE)ISOLATED TESTING AGAINST S. AGALACTIAE NOT ROUTINELY PERFORMED DUE TO PREDICTABILITY OF AMP/PEN/VAN SUSCEPTIBILITY. Performed at [facility], [place] [address], [place], [place] [address]    Report Status 11/28/2019 FINAL  Final  Culture, blood (routine x 2)     Status: None   Collection Time: 11/27/19  6:17 PM   Specimen: BLOOD  Result Value Ref Range Status   Specimen Description   Final    BLOOD RIGHT ANTECUBITAL Performed at [place] [address] [place], [place] [address]    Special Requests   Final    BOTTLES DRAWN AEROBIC AND ANAEROBIC Blood Culture adequate volume Performed at [place] [address] [place],  [address]    Culture   Final  NO GROWTH 5  DAYS Performed at [facility], [place] [address] [place], [place] [address]    Report Status 12/02/2019 FINAL  Final  Culture, blood (routine x 2)     Status: None   Collection Time: 11/27/19  6:17 PM   Specimen: BLOOD  Result Value Ref Range Status   Specimen Description   Final    BLOOD RIGHT HAND Performed at [place] [address] [place], [place] [address]    Special Requests   Final    BOTTLES DRAWN AEROBIC AND ANAEROBIC Blood Culture adequate volume Performed at [place] [address] [place], [place] [address]    Culture   Final    NO GROWTH 5 DAYS Performed at [facility], [place] [address], [place], [place] [address]    Report Status 12/02/2019 FINAL  Final  MRSA PCR Screening     Status: None   Collection Time: 11/27/19 11:25 PM   Specimen: Nasal Mucosa; Nasopharyngeal  Result Value Ref Range Status   MRSA by PCR NEGATIVE NEGATIVE Final    Comment:        The GeneXpert MRSA Assay (FDA approved for NASAL specimens only), is one component of a comprehensive MRSA colonization surveillance program. It is not intended to diagnose MRSA infection nor to guide or monitor treatment for MRSA infections. Performed at [facility], [place] [address], [place],  [address]          Radiology Studies: No results found.      Scheduled Meds: . amoxicillin-clavulanate  1 tablet Oral Q12H  . baclofen  5 mg Oral TID  . chlorhexidine  15 mL Mouth Rinse BID  . Chlorhexidine Gluconate Cloth  6 each Topical Q0600  . enoxaparin (LOVENOX) injection  40 mg Subcutaneous Q24H  . erythromycin  1 application Right Eye QHS  . mouth rinse  15 mL Mouth Rinse q12n4p  . pantoprazole  40 mg Oral QHS  . polyvinyl alcohol  1 drop Right Eye TID  . senna-docusate  1 tablet Oral QHS   Continuous Infusions: . sodium chloride 10 mL/hr at 12/01/19 1800  . methylPREDNISolone (SOLU-MEDROL) injection 1,000  mg (12/06/19 1004)     LOS: 9 days    Time spent: 34 minutes with over 50% of the time coordinating the patient's care    Harold Hedge, DO [facility] Pager [phone]  If 7PM-7AM, please contact night-coverage [email or web address] Password [email or web address] 12/06/2019, 5:02 PM

## 2019-12-06 NOTE — Progress Notes (Signed)
Inpatient Rehabilitation-Admissions Coordinator   Inpatient Rehab Consult received.  I met with pt and his wife at the bedside for rehabilitation assessment. We reviewed CIR program details, estimated length of stay, and expected level of assistance required at DC. Pt highly motivated for return to his prior functional abilities. Feel pt is a good candidate for CIR at this time and has the potential to make gains in a reasonable amount of time that will allow him to return home with the support of his wife.   Pt and his wife would like to pursue IP Rehab at this time. AC will review case with PM&R MD tomorrow for final decision on possible admit to CIR.   Will follow up tomorrow.   Jhonnie Garner, OTR/L  Rehab Admissions Coordinator  [phone] 12/06/2019 6:54 PM

## 2019-12-07 ENCOUNTER — Ambulatory Visit: Payer: Medicare Other | Admitting: Neurology

## 2019-12-07 ENCOUNTER — Inpatient Hospital Stay (HOSPITAL_COMMUNITY): Payer: Medicare Other

## 2019-12-07 ENCOUNTER — Inpatient Hospital Stay (HOSPITAL_COMMUNITY)
Admission: RE | Admit: 2019-12-07 | Discharge: 2020-01-03 | DRG: 058 | Disposition: A | Discharge status: Home Health | Payer: Medicare Other | Source: Other Acute Inpatient Hospital | Attending: Physical Medicine & Rehabilitation | Admitting: Physical Medicine & Rehabilitation

## 2019-12-07 ENCOUNTER — Encounter

## 2019-12-07 ENCOUNTER — Other Ambulatory Visit: Payer: Self-pay

## 2019-12-07 ENCOUNTER — Encounter (HOSPITAL_COMMUNITY): Payer: Self-pay | Admitting: Physical Medicine and Rehabilitation

## 2019-12-07 DIAGNOSIS — K219 Gastro-esophageal reflux disease without esophagitis: Secondary | ICD-10-CM | POA: Diagnosis present

## 2019-12-07 DIAGNOSIS — R5381 Other malaise: Secondary | ICD-10-CM | POA: Diagnosis present

## 2019-12-07 DIAGNOSIS — Z66 Do not resuscitate: Secondary | ICD-10-CM | POA: Diagnosis not present

## 2019-12-07 DIAGNOSIS — G9341 Metabolic encephalopathy: Secondary | ICD-10-CM | POA: Diagnosis not present

## 2019-12-07 DIAGNOSIS — R252 Cramp and spasm: Secondary | ICD-10-CM

## 2019-12-07 DIAGNOSIS — L98429 Non-pressure chronic ulcer of back with unspecified severity: Secondary | ICD-10-CM | POA: Diagnosis present

## 2019-12-07 DIAGNOSIS — R270 Ataxia, unspecified: Secondary | ICD-10-CM | POA: Diagnosis present

## 2019-12-07 DIAGNOSIS — G35 Multiple sclerosis: Principal | ICD-10-CM

## 2019-12-07 DIAGNOSIS — T380X5A Adverse effect of glucocorticoids and synthetic analogues, initial encounter: Secondary | ICD-10-CM | POA: Diagnosis not present

## 2019-12-07 DIAGNOSIS — F32A Depression, unspecified: Secondary | ICD-10-CM

## 2019-12-07 DIAGNOSIS — K5901 Slow transit constipation: Secondary | ICD-10-CM

## 2019-12-07 DIAGNOSIS — K592 Neurogenic bowel, not elsewhere classified: Secondary | ICD-10-CM | POA: Diagnosis present

## 2019-12-07 DIAGNOSIS — Z8249 Family history of ischemic heart disease and other diseases of the circulatory system: Secondary | ICD-10-CM

## 2019-12-07 DIAGNOSIS — R7401 Elevation of levels of liver transaminase levels: Secondary | ICD-10-CM | POA: Diagnosis present

## 2019-12-07 DIAGNOSIS — Z7189 Other specified counseling: Secondary | ICD-10-CM | POA: Diagnosis not present

## 2019-12-07 DIAGNOSIS — N179 Acute kidney failure, unspecified: Secondary | ICD-10-CM

## 2019-12-07 DIAGNOSIS — N390 Urinary tract infection, site not specified: Secondary | ICD-10-CM | POA: Diagnosis not present

## 2019-12-07 DIAGNOSIS — Z9119 Patient's noncompliance with other medical treatment and regimen: Secondary | ICD-10-CM

## 2019-12-07 DIAGNOSIS — Z91199 Patient's noncompliance with other medical treatment and regimen due to unspecified reason: Secondary | ICD-10-CM

## 2019-12-07 DIAGNOSIS — Z978 Presence of other specified devices: Secondary | ICD-10-CM

## 2019-12-07 DIAGNOSIS — Z87440 Personal history of urinary (tract) infections: Secondary | ICD-10-CM | POA: Diagnosis not present

## 2019-12-07 DIAGNOSIS — E46 Unspecified protein-calorie malnutrition: Secondary | ICD-10-CM | POA: Diagnosis present

## 2019-12-07 DIAGNOSIS — I4891 Unspecified atrial fibrillation: Secondary | ICD-10-CM | POA: Diagnosis present

## 2019-12-07 DIAGNOSIS — G35D Multiple sclerosis, unspecified: Secondary | ICD-10-CM | POA: Diagnosis present

## 2019-12-07 DIAGNOSIS — R1310 Dysphagia, unspecified: Secondary | ICD-10-CM

## 2019-12-07 DIAGNOSIS — Z87891 Personal history of nicotine dependence: Secondary | ICD-10-CM

## 2019-12-07 DIAGNOSIS — F331 Major depressive disorder, recurrent, moderate: Secondary | ICD-10-CM | POA: Diagnosis not present

## 2019-12-07 DIAGNOSIS — N319 Neuromuscular dysfunction of bladder, unspecified: Secondary | ICD-10-CM

## 2019-12-07 DIAGNOSIS — J9600 Acute respiratory failure, unspecified whether with hypoxia or hypercapnia: Secondary | ICD-10-CM | POA: Diagnosis present

## 2019-12-07 DIAGNOSIS — L89312 Pressure ulcer of right buttock, stage 2: Secondary | ICD-10-CM | POA: Diagnosis present

## 2019-12-07 DIAGNOSIS — R471 Dysarthria and anarthria: Secondary | ICD-10-CM | POA: Diagnosis present

## 2019-12-07 DIAGNOSIS — J189 Pneumonia, unspecified organism: Secondary | ICD-10-CM | POA: Diagnosis present

## 2019-12-07 DIAGNOSIS — Z818 Family history of other mental and behavioral disorders: Secondary | ICD-10-CM

## 2019-12-07 DIAGNOSIS — E870 Hyperosmolality and hypernatremia: Secondary | ICD-10-CM

## 2019-12-07 DIAGNOSIS — F329 Major depressive disorder, single episode, unspecified: Secondary | ICD-10-CM

## 2019-12-07 DIAGNOSIS — Z7401 Bed confinement status: Secondary | ICD-10-CM | POA: Diagnosis not present

## 2019-12-07 DIAGNOSIS — Z515 Encounter for palliative care: Secondary | ICD-10-CM

## 2019-12-07 DIAGNOSIS — D62 Acute posthemorrhagic anemia: Secondary | ICD-10-CM | POA: Diagnosis present

## 2019-12-07 DIAGNOSIS — Z993 Dependence on wheelchair: Secondary | ICD-10-CM

## 2019-12-07 DIAGNOSIS — E8809 Other disorders of plasma-protein metabolism, not elsewhere classified: Secondary | ICD-10-CM | POA: Diagnosis not present

## 2019-12-07 DIAGNOSIS — A419 Sepsis, unspecified organism: Secondary | ICD-10-CM

## 2019-12-07 DIAGNOSIS — R739 Hyperglycemia, unspecified: Secondary | ICD-10-CM

## 2019-12-07 DIAGNOSIS — Z79899 Other long term (current) drug therapy: Secondary | ICD-10-CM

## 2019-12-07 DIAGNOSIS — M2550 Pain in unspecified joint: Secondary | ICD-10-CM | POA: Diagnosis not present

## 2019-12-07 LAB — CBC
HCT: 36.2 % — ABNORMAL LOW (ref 39.0–52.0)
HCT: 37.8 % — ABNORMAL LOW (ref 39.0–52.0)
Hemoglobin: 11.9 g/dL — ABNORMAL LOW (ref 13.0–17.0)
Hemoglobin: 12.8 g/dL — ABNORMAL LOW (ref 13.0–17.0)
MCH: 31.1 pg (ref 26.0–34.0)
MCH: 30.8 pg (ref 26.0–34.0)
MCHC: 32.9 g/dL (ref 30.0–36.0)
MCHC: 33.9 g/dL (ref 30.0–36.0)
MCV: 94.5 fL (ref 80.0–100.0)
MCV: 91.1 fL (ref 80.0–100.0)
Platelets: 239 10*3/uL (ref 150–400)
Platelets: 239 10*3/uL (ref 150–400)
RBC: 3.83 MIL/uL — ABNORMAL LOW (ref 4.22–5.81)
RBC: 4.15 MIL/uL — ABNORMAL LOW (ref 4.22–5.81)
RDW: 16.4 % — ABNORMAL HIGH (ref 11.5–15.5)
RDW: 16 % — ABNORMAL HIGH (ref 11.5–15.5)
WBC: 9.4 10*3/uL (ref 4.0–10.5)
WBC: 8 10*3/uL (ref 4.0–10.5)
nRBC: 0.2 % (ref 0.0–0.2)
nRBC: 0 % (ref 0.0–0.2)

## 2019-12-07 LAB — BASIC METABOLIC PANEL
Anion gap: 8 (ref 5–15)
Anion gap: 9 (ref 5–15)
BUN: 34 mg/dL — ABNORMAL HIGH (ref 8–23)
BUN: 33 mg/dL — ABNORMAL HIGH (ref 8–23)
CO2: 23 mmol/L (ref 22–32)
CO2: 23 mmol/L (ref 22–32)
Calcium: 9.1 mg/dL (ref 8.9–10.3)
Calcium: 9.2 mg/dL (ref 8.9–10.3)
Chloride: 115 mmol/L — ABNORMAL HIGH (ref 98–111)
Chloride: 113 mmol/L — ABNORMAL HIGH (ref 98–111)
Creatinine, Ser: 0.77 mg/dL (ref 0.61–1.24)
Creatinine, Ser: 0.69 mg/dL (ref 0.61–1.24)
GFR calc Af Amer: >60 mL/min (ref >60)
GFR calc Af Amer: >60 mL/min (ref >60)
GFR calc non Af Amer: >60 mL/min (ref >60)
GFR calc non Af Amer: >60 mL/min (ref >60)
Glucose, Bld: 107 mg/dL — ABNORMAL HIGH (ref 70–99)
Glucose, Bld: 168 mg/dL — ABNORMAL HIGH (ref 70–99)
Potassium: 4.1 mmol/L (ref 3.5–5.1)
Potassium: 3.8 mmol/L (ref 3.5–5.1)
Sodium: 146 mmol/L — ABNORMAL HIGH (ref 135–145)
Sodium: 145 mmol/L (ref 135–145)

## 2019-12-07 LAB — URINALYSIS, ROUTINE W REFLEX MICROSCOPIC
Bilirubin Urine: NEGATIVE
Glucose, UA: NEGATIVE mg/dL
Ketones, ur: NEGATIVE mg/dL
Nitrite: NEGATIVE
Protein, ur: NEGATIVE mg/dL
Specific Gravity, Urine: 1.024 (ref 1.005–1.030)
WBC, UA: >50 WBC/hpf — ABNORMAL HIGH (ref 0–5)
pH: 6 (ref 5.0–8.0)

## 2019-12-07 LAB — CREATININE, SERUM
Creatinine, Ser: 0.92 mg/dL (ref 0.61–1.24)
GFR calc Af Amer: >60 mL/min (ref >60)
GFR calc non Af Amer: >60 mL/min (ref >60)

## 2019-12-07 LAB — AMMONIA: Ammonia: 35 umol/L (ref 9–35)

## 2019-12-07 MED ORDER — DEXTROSE 5 % IV SOLN: INTRAVENOUS | Status: DC

## 2019-12-07 MED ORDER — AMOXICILLIN-POT CLAVULANATE 875-125 MG PO TABS
1.0000 | ORAL_TABLET | Freq: Two times a day (BID) | ORAL | Status: AC
  Administered 2019-12-07 – 2019-12-09 (×4): 1 via ORAL
  Filled 2019-12-07 (×4): qty 1

## 2019-12-07 MED ORDER — PANTOPRAZOLE SODIUM 40 MG PO TBEC
40.0000 mg | DELAYED_RELEASE_TABLET | Freq: Every day | ORAL | Status: DC
  Administered 2019-12-07: 40 mg via ORAL
  Filled 2019-12-07 (×2): qty 1

## 2019-12-07 MED ORDER — POLYETHYLENE GLYCOL 3350 17 G PO PACK: 17.0000 g | PACK | Freq: Every day | ORAL | Status: DC | PRN

## 2019-12-07 MED ORDER — ENOXAPARIN SODIUM 40 MG/0.4ML ~~LOC~~ SOLN
40.0000 mg | SUBCUTANEOUS | Status: DC
  Administered 2019-12-07 – 2020-01-02 (×26): 40 mg via SUBCUTANEOUS
  Filled 2019-12-07 (×26): qty 0.4

## 2019-12-07 MED ORDER — SENNOSIDES-DOCUSATE SODIUM 8.6-50 MG PO TABS: 1.0000 | ORAL_TABLET | Freq: Every day | ORAL | Status: DC

## 2019-12-07 MED ORDER — POLYETHYLENE GLYCOL 3350 17 G PO PACK: 17.0000 g | PACK | Freq: Every day | ORAL | 0 refills | Status: DC | PRN

## 2019-12-07 MED ORDER — GERHARDT'S BUTT CREAM
TOPICAL_CREAM | Freq: Two times a day (BID) | CUTANEOUS | Status: DC
  Administered 2019-12-08 – 2019-12-26 (×5): 1 via TOPICAL
  Filled 2019-12-07: qty 1

## 2019-12-07 MED ORDER — ACETAMINOPHEN 325 MG PO TABS: 650.0000 mg | ORAL_TABLET | Freq: Four times a day (QID) | ORAL | Status: DC | PRN

## 2019-12-07 MED ORDER — ONDANSETRON HCL 4 MG/2ML IJ SOLN: 4.0000 mg | Freq: Four times a day (QID) | INTRAMUSCULAR | Status: DC | PRN

## 2019-12-07 MED ORDER — BACLOFEN 5 MG HALF TABLET
5.0000 mg | ORAL_TABLET | Freq: Three times a day (TID) | ORAL | Status: DC
  Administered 2019-12-07 – 2019-12-09 (×6): 5 mg via ORAL
  Filled 2019-12-07 (×6): qty 1

## 2019-12-07 MED ORDER — SODIUM CHLORIDE 0.45 % IV SOLN: INTRAVENOUS | Status: DC

## 2019-12-07 MED ORDER — ENOXAPARIN SODIUM 40 MG/0.4ML ~~LOC~~ SOLN: 40.0000 mg | SUBCUTANEOUS | Status: DC

## 2019-12-07 MED ORDER — ACETAMINOPHEN 650 MG RE SUPP: 650.0000 mg | Freq: Four times a day (QID) | RECTAL | Status: DC | PRN

## 2019-12-07 MED ORDER — SENNOSIDES-DOCUSATE SODIUM 8.6-50 MG PO TABS
1.0000 | ORAL_TABLET | Freq: Every day | ORAL | Status: DC
  Administered 2019-12-07 – 2020-01-02 (×21): 1 via ORAL
  Filled 2019-12-07 (×23): qty 1

## 2019-12-07 MED ORDER — ONDANSETRON HCL 4 MG PO TABS: 4.0000 mg | ORAL_TABLET | Freq: Four times a day (QID) | ORAL | Status: DC | PRN

## 2019-12-07 MED ORDER — NAPHAZOLINE-GLYCERIN 0.012-0.2 % OP SOLN
1.0000 [drp] | Freq: Four times a day (QID) | OPHTHALMIC | Status: DC | PRN
  Administered 2019-12-07: 1 [drp] via OPHTHALMIC
  Administered 2019-12-11 – 2019-12-16 (×3): 2 [drp] via OPHTHALMIC
  Filled 2019-12-07 (×2): qty 15

## 2019-12-07 MED ORDER — POLYVINYL ALCOHOL 1.4 % OP SOLN
1.0000 [drp] | Freq: Three times a day (TID) | OPHTHALMIC | Status: DC
  Administered 2019-12-07 – 2019-12-29 (×14): 1 [drp] via OPHTHALMIC
  Filled 2019-12-07 (×2): qty 15

## 2019-12-07 MED ORDER — AMOXICILLIN-POT CLAVULANATE 875-125 MG PO TABS: 1.0000 | ORAL_TABLET | Freq: Two times a day (BID) | ORAL | 0 refills | Status: DC

## 2019-12-07 MED ORDER — SORBITOL 70 % SOLN
30.0000 mL | Freq: Every day | Status: DC | PRN
  Administered 2019-12-19 – 2019-12-20 (×2): 30 mL via ORAL
  Filled 2019-12-07 (×3): qty 30

## 2019-12-07 MED ORDER — RESOURCE THICKENUP CLEAR PO POWD: ORAL | Status: DC | PRN

## 2019-12-07 MED ORDER — ENSURE ENLIVE PO LIQD
237.0000 mL | Freq: Three times a day (TID) | ORAL | Status: DC
  Administered 2019-12-07 – 2019-12-25 (×32): 237 mL via ORAL
  Filled 2019-12-07 (×3): qty 237

## 2019-12-07 NOTE — Progress Notes (Signed)
Inpatient Rehabilitation-Admissions Coordinator   Pt and his wife would like to proceed with CIR. I have discussed case with Dr. Naaman Plummer who feels pt is appropriate for CIR at this time. I have received medical clearance from Dr. Neysa Bonito for admit to CIR today. Will notify [facility] team and RN.   Please call if questions.   Jhonnie Garner, OTR/L  Rehab Admissions Coordinator  [phone] 12/07/2019 1:47 PM

## 2019-12-07 NOTE — Progress Notes (Signed)
Pt refusing any more rectal temps. On call provider made aware. Not symptomatic. Last temp 95.1.

## 2019-12-07 NOTE — H&P (Addendum)
Physical Medicine and Rehabilitation Admission H&P    No chief complaint on file. : HPI: Stephen Wade. Stephen Wade is a 68 year old right-handed male with history of multiple sclerosis followed by [name] maintained on Ocrevus 300 mg every six months.  Recurrent lower urinary tract symptoms/self caths followed by [name], remote tobacco abuse.  Per chart review patient lives with spouse.  Two-level home with bed and bath main level with ramped entrance.  He did have a home health aide for some ADLs however aide was recently COVID-19 positive.  Patient primarily used a power scooter prior to admission he was able to do scoot pivot transfers.  He has been essentially nonambulatory for 8 years.  Presented 11/27/2019 with increased weakness decreased functional mobility.   Cranial CT/MRI no new intracranial abnormality stable since 2018 with changes compatible with chronic demyelination.  Chest x-ray did show infiltrate right base representing pneumonia versus aspiration.  CT angiogram of the chest negative for pulmonary emboli.  Patient was placed on a 5-day course of IV Solu-Medrol for pseudoflare of multiple sclerosis.  Admission chemistries hemoglobin 13.8/hematocrit 42.1, sodium 146, BUN 30, SARS coronavirus negative, urine culture greater 100,000 group B strep ( S.Agalactiae).  EKG showed atrial fibrillation RVR heart rates 120s to 140s he was started on IV Cardizem initially did require nonrebreather mask.  He was transferred to stepdown telemetry 12/01/2019.  His Cardizem was later discontinued due to bouts of hypotension no current plan for anticoagulation at this time.  Initially maintained on IV Rocephin transition to vancomycin and Zosyn.  MRSA negative and Zosyn was changed to Augmentin completing a 10-day course.  A follow-up chest x-ray was completed 12/07/2019 showed bibasilar pulmonary infiltrates improved from prior exams.  In regards to patient's recurrent UTIs now with urinary tract infection a  Foley catheter tube was placed plan was to follow-up outpatient urology services for consideration of suprapubic catheter.FOLEY TUBE IS not to be removed.  Subcutaneous Lovenox for DVT prophylaxis.  Noted pressure injury blistered area mid bilateral buttocks with generalized wound care.  Palliative care was consulted to establish goals of care.  Patient is currently on dysphagia #1 nectar thick liquid diet and close monitoring of hydration with lattest BUN 34 up from 31-27-21  and received a fluid bolus 12/07/2019.  Therapy evaluations completed and patient was admitted for a comprehensive rehab program.  Review of Systems  Constitutional: Negative for chills and fever.  HENT: Negative for hearing loss.   Eyes: Negative for blurred vision and double vision.  Respiratory: Negative for cough and shortness of breath.   Cardiovascular: Positive for leg swelling. Negative for chest pain and palpitations.  Gastrointestinal: Positive for constipation. Negative for heartburn and vomiting.       GERD  Genitourinary: Negative for hematuria.       Neurogenic bladder related to multiple sclerosis  Musculoskeletal: Positive for joint pain and myalgias.  Skin: Negative for rash.  All other systems reviewed and are negative.  Past Medical History:  Diagnosis Date  . GERD (gastroesophageal reflux disease)    had on- off chest pain, symptoms resolved with Prilosec 11/2010  . Lower urinary tract symptoms (LUTS)    Dr Patsi Sears  . MS (multiple sclerosis) (HCC) 2005  . Testicular hypogonadism   . Urge incontinence of urine   . Urolithiasis 2011   Past Surgical History:  Procedure Laterality Date  . APPENDECTOMY      ruptured 03-2010  . VASECTOMY     Family History  Problem Relation  Age of Onset  . Heart attack Mother   . Brain cancer Father   . Colon cancer Neg Hx   . Prostate cancer Neg Hx    Social History:  reports that he has quit smoking. He has never used smokeless tobacco. He reports that  he does not drink alcohol or use drugs. Allergies: No Known Allergies Medications Prior to Admission  Medication Sig Dispense Refill  . amoxicillin-clavulanate (AUGMENTIN) 875-125 MG tablet Take 1 tablet by mouth every 12 (twelve) hours. 4 tablet 0  . ocrelizumab (OCREVUS) 300 MG/10ML injection Inject 300 mg every 6 (six) months into the vein.    Marland Kitchen omeprazole (PRILOSEC) 20 MG capsule Take 20 mg daily as needed by mouth (for acid reflex).     . polyethylene glycol (MIRALAX / GLYCOLAX) 17 g packet Take 17 g by mouth daily as needed for mild constipation or moderate constipation. 14 each 0  . senna-docusate (SENOKOT-S) 8.6-50 MG tablet Take 1 tablet by mouth at bedtime.      Drug Regimen Review Drug regimen was reviewed and remains appropriate with no significant issues identified  Home:     Functional History:    Functional Status:  Mobility:          ADL:    Cognition:      Physical Exam: Blood pressure (!) 143/73, pulse 72, resp. rate 14. Physical Exam  Nursing note and vitals reviewed. Constitutional: He appears well-developed and well-nourished. No distress.  Slightly confused- very slow to process/answer questions, no one else except nursing at bedside, NAD  HENT:  Head: Normocephalic and atraumatic.  Mouth/Throat: No oropharyngeal exudate.  No facial asymmetry seen  Eyes: Pupils are equal, round, and reactive to light. Conjunctivae and EOM are normal.  Neck: No tracheal deviation present.  Cardiovascular:  RRR, no M/R/G- listened for >1 minute- not in Afib  Respiratory: Effort normal.  Good air movement B/L- a little decreased at bases B/L, but no W/R/R  GI:  Soft, NT, reported LBM yesterday?; ND, protuberant, (+)hypoactive BS  Genitourinary:    Penis normal.     Genitourinary Comments: Foley tube in place. A little barrier cream between legs   Musculoskeletal:     Cervical back: Normal range of motion and neck supple.     Comments: UEs 4+/5 in deltoids,  biceps, triceps, E, grip and finger abduction B/L  LLE- 1/5 in HF/KE and PF; couldn't see any movement in DF or EHL RLE 0/5- no movement seen on exam    Neurological:  Patient is alert.  He was a bit tearful.  Follows simple commands.  Speech is a bit dysarthric.  Patient continued to ask questions on why he was on rehab.  Provides basic information as age, name and place as well some delay in processing and had some difficulty with other biographical information.  He is a very limited medical historian. 3 beats clonus on LEs- a few spasms seen in LEs B/L Sensation intact to Light touch per pt in all 4 extremities  Skin:  Stage I, verge of Stage II B/L buttocks pressure ulcers upon admission- a small area that appeared DTI/bruised, however was BLANCHABLE on R mid buttock LUE on LUE and 2 IVs in LUE- no signs of infiltration  Psychiatric:  Slow to process and to respond; appears not sure of medical info- poor historian    Results for orders placed or performed during the hospital encounter of 11/27/19 (from the past 48 hour(s))  AM BMP  Status: Abnormal   Collection Time: 12/06/19  5:13 AM  Result Value Ref Range   Sodium 145 135 - 145 mmol/L   Potassium 4.0 3.5 - 5.1 mmol/L   Chloride 114 (H) 98 - 111 mmol/L   CO2 23 22 - 32 mmol/L   Glucose, Bld 110 (H) 70 - 99 mg/dL   BUN 31 (H) 8 - 23 mg/dL   Creatinine, Ser 1.610.76 0.61 - 1.24 mg/dL   Calcium 9.2 8.9 - 09.610.3 mg/dL   GFR calc non Af Amer >60 >60 mL/min   GFR calc Af Amer >60 >60 mL/min   Anion gap 8 5 - 15    Comment: Performed at [facility], [address] [place], [place] [address]  AM CBC     Status: Abnormal   Collection Time: 12/06/19  5:13 AM  Result Value Ref Range   WBC 10.6 (H) 4.0 - 10.5 K/uL   RBC 3.88 (L) 4.22 - 5.81 MIL/uL   Hemoglobin 11.9 (L) 13.0 - 17.0 g/dL   HCT 98.136.7 (L) 19.139.0 - 47.852.0 %   MCV 94.6 80.0 - 100.0 fL   MCH 30.7 26.0 - 34.0 pg   MCHC 32.4 30.0 - 36.0 g/dL   RDW 29.516.2 (H)  62.111.5 - 15.5 %   Platelets 222 150 - 400 K/uL   nRBC 0.0 0.0 - 0.2 %    Comment: Performed at [facility], [address] [place], [place] [address]  CBC     Status: Abnormal   Collection Time: 12/07/19  8:34 AM  Result Value Ref Range   WBC 9.4 4.0 - 10.5 K/uL   RBC 3.83 (L) 4.22 - 5.81 MIL/uL   Hemoglobin 11.9 (L) 13.0 - 17.0 g/dL   HCT 78.436.2 (L) 69.639.0 - 29.552.0 %   MCV 94.5 80.0 - 100.0 fL   MCH 31.1 26.0 - 34.0 pg   MCHC 32.9 30.0 - 36.0 g/dL   RDW 28.416.4 (H) 13.211.5 - 44.015.5 %   Platelets 239 150 - 400 K/uL   nRBC 0.2 0.0 - 0.2 %    Comment: Performed at [facility], [address] [place], [place] [address]  Basic metabolic panel     Status: Abnormal   Collection Time: 12/07/19  8:34 AM  Result Value Ref Range   Sodium 146 (H) 135 - 145 mmol/L   Potassium 4.1 3.5 - 5.1 mmol/L   Chloride 115 (H) 98 - 111 mmol/L   CO2 23 22 - 32 mmol/L   Glucose, Bld 107 (H) 70 - 99 mg/dL   BUN 34 (H) 8 - 23 mg/dL   Creatinine, Ser 5.360.77 0.61 - 1.24 mg/dL   Calcium 9.1 8.9 - 64.410.3 mg/dL   GFR calc non Af Amer >60 >60 mL/min   GFR calc Af Amer >60 >60 mL/min   Anion gap 8 5 - 15    Comment: Performed at [facility], [address] [place], [place] [address]  Urinalysis, Routine w reflex microscopic     Status: Abnormal   Collection Time: 12/07/19  1:25 PM  Result Value Ref Range   Color, Urine YELLOW YELLOW   APPearance HAZY (A) CLEAR   Specific Gravity, Urine 1.024 1.005 - 1.030   pH 6.0 5.0 - 8.0   Glucose, UA NEGATIVE NEGATIVE mg/dL   Hgb urine dipstick SMALL (A) NEGATIVE   Bilirubin Urine NEGATIVE NEGATIVE   Ketones, ur NEGATIVE NEGATIVE mg/dL   Protein, ur NEGATIVE NEGATIVE mg/dL  Nitrite NEGATIVE NEGATIVE   Leukocytes,Ua LARGE (A) NEGATIVE   RBC / HPF 11-20 0 - 5 RBC/hpf   WBC, UA >50 (H) 0 - 5 WBC/hpf   Bacteria, UA RARE (A) NONE SEEN   WBC Clumps PRESENT     Comment: Performed at [facility], [address] [place], [place] [address]  Basic metabolic panel     Status: Abnormal   Collection Time: 12/07/19  1:31 PM  Result Value Ref Range   Sodium 145 135 - 145 mmol/L   Potassium 3.8 3.5 - 5.1 mmol/L   Chloride 113 (H) 98 - 111 mmol/L   CO2 23 22 - 32 mmol/L   Glucose, Bld 168 (H) 70 - 99 mg/dL   BUN 33 (H) 8 - 23 mg/dL   Creatinine, Ser 1.300.69 0.61 - 1.24 mg/dL   Calcium 9.2 8.9 - 86.510.3 mg/dL   GFR calc non Af Amer >60 >60 mL/min   GFR calc Af Amer >60 >60 mL/min   Anion gap 9 5 - 15    Comment: Performed at [facility], [address] [place], [place] [address]  Ammonia     Status: None   Collection Time: 12/07/19  1:31 PM  Result Value Ref Range   Ammonia 35 9 - 35 umol/L    Comment: Performed at [facility], [address] [place], [place] [address]   DG CHEST PORT 1 VIEW  Result Date: 12/07/2019 CLINICAL DATA:  Cephalopathy. EXAM: PORTABLE CHEST 1 VIEW COMPARISON:  CT 11/29/2019.  Chest x-ray 11/28/2019. FINDINGS: Heart size stable. No pulmonary venous congestion. Bibasilar infiltrates, improved from prior exams. No pleural effusion or pneumothorax. IMPRESSION: Bibasilar pulmonary infiltrates, improved from prior exams. Continued follow-up exams to demonstrate complete clearing suggested. Electronically Signed   By: Maisie Fushomas  Register   On: 12/07/2019 13:47       Medical Problem List and Plan: 1.  Decreased functional mobility secondary to acute respiratory distress with hypoxic respiratory failure secondary to multi focal pneumonia in the setting of MS.  Patient is completing a 10-day course of Augmentin for pneumonia as well as completed 5-day course of IV Solu-Medrol.  Follow-up chest x-ray 12/07/2019 bibasilar pulmonary infiltrates improved from prior exams.  -patient may  shower  -ELOS/Goals: min-mod assist for sitting balance, assistance with transfers, and UB ADLs 2.  Antithrombotics: -DVT/anticoagulation: Subcutaneous Lovenox.   CT angiogram of chest negative for pulmonary emboli  -antiplatelet therapy: N/A 3. Pain Management/spasticity: Baclofen 5 mg 3 times daily 4. Mood: Provide emotional support  -antipsychotic agents: N/A 5. Neuropsych: This patient is capable of making decisions on his own behalf. 6. Skin/Wound Care: Routine skin checks/pressure injury to buttocks.  Follow-up skin care checks 7. Fluids/Electrolytes/Nutrition: Routine in and outs with follow-up chemistries 8.  Atrial fibrillation with RVR.  IV Cardizem discontinued due to hypotension.  Cardiac rate now controlled and not in A fib on exam.  No current plan for anticoagulation 9.  Neurogenic bowel bladder with recurrent UTIs.  Indwelling Foley catheter tube to remain in place at the recommendations of urology services per plan outpatient evaluation for possible need for suprapubic tube 10.  Dysphagia/.  Dysphagia #1 nectar liquids.  Follow-up speech therapy.  Monitor hydration and received fluid bolus 12/07/2019 11.AKI.  Patient with progressive elevated BUN 34-31-27 and needs to be monitored closely while on nectar liquids.  Will begin gentle IV fluids and follow-up chemistries 12. Spasticity in setting of MS- acute service decreased his home  dose of Baclofen to 5 mg TID- still slightly confused- not sure if increasing Baclofen might make things worse- will wait and d/w wife.   Silvestre Mesi, PA   I have personally performed a face to face diagnostic evaluation of this patient and formulated the key components of the plan.  Additionally, I have personally reviewed laboratory data, imaging studies, as well as relevant notes and concur with the physician assistant's documentation above.   The patient's status has not changed from the original H&P.  Any changes in documentation from the acute care chart have been noted above.     Courtney Heys, MD 12/07/2019

## 2019-12-07 NOTE — PMR Pre-admission (Signed)
PMR Admission Coordinator Pre-Admission Assessment  Patient: Stephen Wade is an 68 y.o., male MRN: [id number] DOB: 10-14-1951 Height:   Weight: 95.9 kg  Insurance Information HMO:     PPO:      PCP:      IPA:      80/20: yes      OTHER:  PRIMARY: Medicare part A and B      Policy#: [id number]      Subscriber: Patient CM Name:       Phone#:      Fax#:  Pre-Cert#:       Employer:  Benefits:  Phone #: online     Name: verified eligibility online via [place] on 12/07/2019 Eff. Date: Part A and B effective 12/23/2017     Deduct: $1,408      Out of Pocket Max: NA      Life Max: NA CIR: Covered per Medicare guidelines once yearly deductible has been met.      SNF: days 1-20, 100%, days 21-100, 80% Outpatient: 80%     Co-Pay: 20% Home Health: 100%      Co-Pay:  DME: 80%     Co-Pay: 20% Providers: Pt's choice SECONDARY: [employer]      Policy#: [id number]      Subscriber: Patient CM Name:       Phone#:      Fax#:  Pre-Cert#:       Employer:  Benefits:  Phone #: [phone]     Name:  Eff. Date:      Deduct:       Out of Pocket Max:       Life Max:  CIR:       SNF:  Outpatient:      Co-Pay:  Home Health:       Co-Pay:  DME:      Co-Pay:   Medicaid Application Date:       Case Manager:  Disability Application Date:       Case Worker:   The "Data Collection Information Summary" for patients in Inpatient Rehabilitation Facilities with attached "Privacy Act [place] Records" was provided and verbally reviewed with: Patient and Family  Emergency Contact Information Contact Information    Name Relation Home Work [place]   [phone]      Current Medical History  Patient Admitting Diagnosis: Pseudoexacerbation of MS  History of Present Illness: Stephen Wade is a 45 year old right-handed male with history of multiple sclerosis followed by [name].  Recurrent lower urinary tract symptoms/self caths followed by Dr. Gaynelle Arabian, remote tobacco abuse.  He  has been essentially nonambulatory for 8 years.  Presented 11/27/2019 with increased weakness decreased functional mobility.  By report patient had recently stopped taking his Ocrelizumab..  Cranial CT/MRI no new intracranial abnormality stable since 2018 with changes compatible with chronic demyelination.  Chest x-ray did show infiltrate right base representing pneumonia versus aspiration.  CT angiogram of the chest negative for pulmonary emboli.  Patient was placed on a 5-day course of IV Solu-Medrol for pseudoflare of multiple sclerosis.  Admission chemistries hemoglobin 13.8/hematocrit 42.1, sodium 146, BUN 30, SARS coronavirus negative, urine culture greater 100,000 group B strep ( S.Agalactiae).  EKG showed atrial fibrillation RVR heart rates 120s to 140s he was started on IV Cardizem initially did require nonrebreather mask.  He was transferred to stepdown telemetry 12/01/2019.  His Cardizem was later discontinued due to bouts of hypotension no current plan for anticoagulation at this time.  Initially maintained on IV Rocephin transition to vancomycin and Zosyn.  MRSA negative and Zosyn was changed to Augmentin completing a 10-day course.  In regards to patient's recurrent UTIs now with urinary tract infection a Foley catheter tube was placed plan was to follow-up outpatient urology services for consideration of suprapubic catheter.FOLEY TUBE IS not to be removed.  Subcutaneous Lovenox for DVT prophylaxis.  Noted pressure injury blistered area mid bilateral buttocks with generalized wound care.  Palliative care was consulted to establish goals of care.  Patient is currently on dysphagia #1 nectar thick liquid diet and close monitoring of hydration with lattest BUN 34 up from 31-27-21  and received a fluid bolus 12/07/2019.  Therapy evaluations completed and patient is to be admitted for a comprehensive rehab program on 12/07/2019.      Patient's medical record from [facility] has been reviewed by  the rehabilitation admission coordinator and physician.  Past Medical History  Past Medical History:  Diagnosis Date  . GERD (gastroesophageal reflux disease)    had on- off chest pain, symptoms resolved with Prilosec 11/2010  . Lower urinary tract symptoms (LUTS)    Dr Gaynelle Arabian  . MS (multiple sclerosis) ([place]) 2005  . Testicular hypogonadism   . Urge incontinence of urine   . Urolithiasis 2011    Family History   family history includes Brain cancer in his father; Heart attack in his mother.  Prior Rehab/Hospitalizations Has the patient had prior rehab or hospitalizations prior to admission? No  Has the patient had major surgery during 100 days prior to admission? No   Current Medications  Current Facility-Administered Medications:  .  0.9 %  sodium chloride infusion, 250 mL, Intravenous, Continuous, Byrum, Rose Fillers, MD, Last Rate: 10 mL/hr at 12/01/19 1800, Rate Verify at 12/01/19 1800 .  acetaminophen (TYLENOL) tablet 650 mg, 650 mg, Oral, Q6H PRN **OR** acetaminophen (TYLENOL) suppository 650 mg, 650 mg, Rectal, Q6H PRN, Rai, Ripudeep K, MD .  amoxicillin-clavulanate (AUGMENTIN) 875-125 MG per tablet 1 tablet, 1 tablet, Oral, Q12H, Harold Hedge, MD, 1 tablet at 12/07/19 0932 .  baclofen (LIORESAL) tablet 5 mg, 5 mg, Oral, TID, Harold Hedge, MD, 5 mg at 12/07/19 0932 .  chlorhexidine (PERIDEX) 0.12 % solution 15 mL, 15 mL, Mouth Rinse, BID, Harold Hedge, MD, 15 mL at 12/07/19 0932 .  Chlorhexidine Gluconate Cloth 2 % PADS 6 each, 6 each, Topical, Q0600, Rai, Ripudeep K, MD, 6 each at 12/04/19 0618 .  dextrose 5 % solution, , Intravenous, Continuous, Harold Hedge, MD, Last Rate: 100 mL/hr at 12/07/19 1103, New Bag at 12/07/19 1103 .  enoxaparin (LOVENOX) injection 40 mg, 40 mg, Subcutaneous, Q24H, Rai, Ripudeep K, MD, 40 mg at 12/06/19 2215 .  feeding supplement (ENSURE ENLIVE) (ENSURE ENLIVE) liquid 237 mL, 237 mL, Oral, TID WC, Harold Hedge, MD, 237 mL at 12/07/19  1104 .  MEDLINE mouth rinse, 15 mL, Mouth Rinse, q12n4p, Harold Hedge, MD, 15 mL at 12/07/19 1104 .  naphazoline-glycerin (CLEAR EYES REDNESS) ophth solution 1-2 drop, 1-2 drop, Both Eyes, QID PRN, Loistine Chance, MD, 1 drop at 12/02/19 2214 .  ondansetron (ZOFRAN) tablet 4 mg, 4 mg, Oral, Q6H PRN **OR** ondansetron (ZOFRAN) injection 4 mg, 4 mg, Intravenous, Q6H PRN, Rai, Ripudeep K, MD .  pantoprazole (PROTONIX) EC tablet 40 mg, 40 mg, Oral, QHS, Green, Terri L, RPH, 40 mg at 12/06/19 2214 .  polyethylene glycol (MIRALAX / GLYCOLAX) packet 17 g, 17 g, Oral,  Daily PRN, Harold Hedge, MD, 17 g at 12/05/19 1633 .  polyvinyl alcohol (LIQUIFILM TEARS) 1.4 % ophthalmic solution 1 drop, 1 drop, Right Eye, TID, Harold Hedge, MD, 1 drop at 12/07/19 1105 .  Resource ThickenUp Clear, , Oral, PRN, Rai, Ripudeep K, MD .  senna-docusate (Senokot-S) tablet 1 tablet, 1 tablet, Oral, QHS, Harold Hedge, MD, 1 tablet at 12/06/19 2212  Patients Current Diet:  Diet Order            DIET - DYS 1 Room service appropriate? Yes with Assist; Fluid consistency: Nectar Thick  Diet effective now              Precautions / Restrictions Precautions Precautions: Fall Precaution Comments: hx MS (R LE deficits > L LE, spastic diplegia) Restrictions Weight Bearing Restrictions: No   Has the patient had 2 or more falls or a fall with injury in the past year? Yes  Prior Activity Level    Prior Functional Level Self Care: Did the patient need help bathing, dressing, using the toilet or eating? Needed some help  Indoor Mobility: Did the patient need assistance with walking from room to room (with or without device)? Independent  Stairs: Did the patient need assistance with internal or external stairs (with or without device)? Dependent  Functional Cognition: Did the patient need help planning regular tasks such as shopping or remembering to take medications? Needed some help  Home Assistive Devices /  [place] Devices/Equipment: Other (Comment), Built-in shower seat, Eyeglasses, Hospital bed, Grab bars around toilet, Grab bars in shower, Hand-held shower hose(manual wheelchair, electric wheelchair, ramp entrance, walk-in shower, lift chair, stair lift, catheter supplies, generator, handicap van) Home Equipment: Transport planner, Wheelchair - manual, [place] [facility] bed  Prior Device Use: Indicate devices/aids used by the patient prior to current illness, exacerbation or injury? scooter for house mobility, lift for scooter into house, bariatric BSC, shower bench,   Current Functional Level Cognition  Overall Cognitive Status: Within Functional Limits for tasks assessed Orientation Level: Oriented X4    Extremity Assessment (includes Sensation/Coordination)  Upper Extremity Assessment: RUE deficits/detail RUE Deficits / Details: keeps RUE in flexion position at rest but able to extend elbow and use minimally during bed mobility/sitting EOB. limited shoulder flexion/ABD. Impaired gross and fine motor coordination RUE Coordination: decreased fine motor, decreased gross motor LUE Deficits / Details: generalized weakness but able to use functionally more then RUE.  LUE Coordination: decreased fine motor, decreased gross motor  Lower Extremity Assessment: Defer to PT evaluation RLE Deficits / Details: spasticity present with attempts to touch or assist R LE, pt unable to perform any active movement with LEs    ADLs  Overall ADL's : Needs assistance/impaired Eating/Feeding: Minimal assistance, Set up, Bed level Grooming: Moderate assistance, Bed level Upper Body Bathing: Sitting, Maximal assistance Upper Body Bathing Details (indicate cue type and reason): able to sit EOB with BUE support and min to max A Lower Body Bathing: Total assistance, +2 for physical assistance, Bed level, Sitting/lateral leans Upper Body Dressing : Maximal assistance, Sitting Upper Body  Dressing Details (indicate cue type and reason): able to sit EOB with BUE support and min to max A Lower Body Dressing: Total assistance, Sitting/lateral leans, Bed level, +2 for physical assistance General ADL Comments: Pt completed bed mobility with +2 max - total A, sat EOB several minutes with min to mod A +2 s/e, physical A. Pt able to facilitate scooting towards HOB (towards stronger side).  Mobility  Overal bed mobility: Needs Assistance Bed Mobility: Supine to Sit, Sit to Supine Supine to sit: Max assist, +2 for physical assistance, HOB elevated Sit to supine: Total assist, +2 for physical assistance General bed mobility comments: assist for BLE off bed and uprighting trunk, pt attempts to assist by reaching for bedrails and pulling on spouse's hand    Transfers  General transfer comment: not tested for safety    Ambulation / Gait / Stairs / Wheelchair Mobility       Posture / Balance Dynamic Sitting Balance Sitting balance - Comments: seated EOB x15 minutes, use of BUE on bed and therapist physically assisting to maintain normal postural alignment, overall max A with moments of 5-10 sec increments of contact guard assist, loss of balance backwards and to left requiring assist from therapist to recover normal postural alignment Balance Overall balance assessment: Needs assistance Sitting-balance support: Bilateral upper extremity supported, Single extremity supported, Feet supported Sitting balance-Leahy Scale: Poor Sitting balance - Comments: seated EOB x15 minutes, use of BUE on bed and therapist physically assisting to maintain normal postural alignment, overall max A with moments of 5-10 sec increments of contact guard assist, loss of balance backwards and to left requiring assist from therapist to recover normal postural alignment    Special needs/care consideration BiPAP/CPAP: no CPM : no Continuous Drip IV: no Dialysis : no        Days : no Life Vest : no Oxygen : no,  on RA Special Bed : no Trach Size : no Wound Vac (area) : no      Location : no Skin : ecchymosis to bilateral hip, MASD to buttocks, coccyx, sacrum (right, left, mid), skin tear to mid buttocks, stage 1 pressure injury to mid bilateral buttocks.             Bowel mgmt: continent, last BM: 12/04/2019 Bladder mgmt: urinary catheter in place Diabetic mgmt: no Behavioral consideration : no Chemo/radiation : no   Previous Home Environment (from acute therapy documentation) Living Arrangements: Spouse/significant other Available Help at Discharge: Family Type of Home: House Home Layout: Able to live on main level with bedroom/bathroom Home Access: Ramped entrance [place]: No Additional Comments: sleeps in lazyboy lift chair  Discharge Living Setting Plans for Discharge Living Setting: Patient's home, Lives with (comment)(wife, plans to have hired aide daily) Type of Home at Discharge: House Discharge Home Layout: Two level, Full bath on main level, Able to live on main level with bedroom/bathroom Alternate Level Stairs-Rails: None Alternate Level Stairs-Number of Steps: NA Discharge Home Access: Ramped entrance, Other (comment)(has scooter lift ) Discharge Bathroom Shower/Tub: Walk-in shower(with bench) Discharge Bathroom Toilet: Handicapped height(uses bariatric BSC next to bed) Discharge Bathroom Accessibility: Yes How Accessible: Accessible via walker(can get scooter into bathroom for shower transfer) Does the patient have any problems obtaining your medications?: No  Social/Family/Support Systems Patient Roles: Spouse Contact Information: Magda Paganini (wife): [phone] Anticipated Caregiver: wife + hired Geophysicist/field seismologist Information: see above Ability/Limitations of Caregiver: Min/Mod A (would like training) Caregiver Availability: 24/7 Discharge Plan Discussed with Primary Caregiver: Yes Is Caregiver In Agreement with Plan?: Yes Does  Caregiver/Family have Issues with Lodging/Transportation while Pt is in Rehab?: No  Goals/Additional Needs Patient/Family Goal for Rehab: PT: Min G transfers, OT: Min A/Mod A; SLP: Min A Expected length of stay: 16-20 days Cultural Considerations: NA Dietary Needs: DYS 1, nectar thick liquids (in V8 juice or Tomato juice ONLY, dietician to order nutritional supplements as indicated  per patient diet order and condition). Equipment Needs: TBD Special Service Needs: hoyer lift Pt/Family Agrees to Admission and willing to participate: Yes Program Orientation Provided & Reviewed with Pt/Caregiver Including Roles  & Responsibilities: Yes(wife and patietn)  Barriers to Discharge: Neurogenic Bowel & Bladder  Barriers to Discharge Comments: may need subrapubic cath at some point (per notes)  Decrease burden of Care through IP rehab admission: NA  Possible need for SNF placement upon discharge: Not anticipated, pt plans to DC home with 24/7 A from wife + (plans for hired aide). Pt has necessary equipment needs at DC with scooter, scooter lift to get into the house, bariatric BSC, shower bench, and has had recommended support persons in place for years to assist with care. Goal is to DC home after CIR stay with support in place and caregiver training.   Patient Condition: I have reviewed medical records from [facility], spoken with RN, MD, and patient and spouse. I met with patient at the bedside for inpatient rehabilitation assessment.  Patient will benefit from ongoing PT, OT and SLP, can actively participate in 3 hours of therapy a day 5 days of the week, and can make measurable gains during the admission.  Patient will also benefit from the coordinated team approach during an Inpatient Acute Rehabilitation admission.  The patient will receive intensive therapy as well as Rehabilitation physician, nursing, social worker, and care management interventions.  Due to bladder management, bowel  management, safety, skin/wound care, disease management, medication administration, pain management and patient education the patient requires 24 hour a day rehabilitation nursing.  The patient is currently Max A + 2 for bed mobility (transfers not attempted) and Min to total A +2 for basic ADLs.  Discharge setting and therapy post discharge at home with home health is anticipated.  Patient has agreed to participate in the Acute Inpatient Rehabilitation Program and will admit today.  Preadmission Screen Completed By:  Raechel Ache, 12/07/2019 1:25 PM ______________________________________________________________________   Discussed status with Dr. Dagoberto Ligas on 12/07/2019 at 2:15PM and received approval for admission today.  Admission Coordinator:  Raechel Ache, OT, time 2:15PM/Date 12/07/2019.   Assessment/Plan: Diagnosis: 1. Does the need for close, 24 hr/day Medical supervision in concert with the patient's rehab needs make it unreasonable for this patient to be served in a less intensive setting? Yes 2. Co-Morbidities requiring supervision/potential complications: MS Afib with RVR, pneumonia, spasticity, Azotemia 3. Due to bladder management, bowel management, safety, skin/wound care, disease management, medication administration and patient education, does the patient require 24 hr/day rehab nursing? Yes 4. Does the patient require coordinated care of a physician, rehab nurse, PT, OT, and SLP to address physical and functional deficits in the context of the above medical diagnosis(es)? Yes Addressing deficits in the following areas: balance, endurance, strength, transferring, bowel/bladder control, bathing, dressing, feeding, grooming and toileting 5. Can the patient actively participate in an intensive therapy program of at least 3 hrs of therapy 5 days a week? Yes 6. The potential for patient to make measurable gains while on inpatient rehab is fair 7. Anticipated functional outcomes upon  discharge from inpatient rehab: min assist PT, min assist OT, supervision SLP 8. Estimated rehab length of stay to reach the above functional goals is:16-20 days 9. Anticipated discharge destination: Home 10. Overall Rehab/Functional Prognosis: fair   MD Signature:

## 2019-12-07 NOTE — Progress Notes (Signed)
Speech Language Pathology Treatment: Dysphagia  Patient Details Name: Stephen Wade MRN: [id number] DOB: 05-25-1951 Today's Date: 12/07/2019 Time: 5621-3086 SLP Time Calculation (min) (ACUTE ONLY): 24 min  Assessment / Plan / Recommendation Clinical Impression  Today upon SLP entrance to room pt eating lunch (being fed by wife) resulting in explosive overt coughing/face turning red when swallowing meat/peas pureed.  SLP raised HOB and encouraged pt to continue to cough until clear - oral suction provided to clear secetions.  Wet voice at baseline noted which was exacerbated with suspected aspiration event.  Once pt recovered, SLP provided warm milk to decrease viscocity of pureed food - however pt expressed displeasure and declined to consume more intake. SLP obtained Ensure *vanilla* flavored with pt self feeding - No indication of aspiration with Ensure and pt benefited from mod cues to take small boluses.    Advised pt and spouse to his aspiration of puree being concerning for pulmonary clearance - Pt and spouse deny this happening with all other intake until SLP visit.  Advised that pt could maintain nutrition (allthough not ideal) via Ensure.  SLP notes pt to transfer to CIR today per wife.   Recommend pt participate in RMST for expiration *not inhalation due to his chronic secretion aspiration risking propelling secretions further into trachea.  Encouraged pt complete exercise at 75% expiratory strength capacity, 5xs each session and 5 sessions daily for 4 weeks.  Reviewed findings of medical study showing improved cough strength in people with Parkinsons' after 4 weeks of prescribed exercises (with advancement per SLP) thus improving airway protection.  Pt and wife reports understanding and advise they will commit to exercises regimen to be established by CIR SLP.    Recommend continue dietary plan with strict aspiration precautions/mitigation to maximize intake as safely as able.  Thanks for  allowing me to help with this pt's care plan.     HPI HPI: 68 yo male adm tto [facility] with sepsis - recurrent UTIs for which he self caths with AMS = Pt w/ MS, GERD,  = after receiving Ativan he demonstrated decreased mentation - 12/5-12/6 pt became tachycardia ? Afib, hypotensive  and was transferred to SDU.  Is now on oxygen and in ICU.  CXR showed possible progressive RLL pna.  Pt MRI showed demyelinating process which is not significantly worse compared to 2018 MRI.  He demonstrates poor airway protection of secretions today with gross dysarthria.  Pt reports he has h/o poor intake recently with increased difficulties swallowing compared to management of secretions.  Also admits to taste changes.  Pt has undergone MBS which revealed aspiration of thin with reflexive cough that did not clear aspirates.  He was maintained on a full liquid/nectar diet - but intake has been poor and he "refuses" to drink the thickened liquids.  SLP met with pt yesterday and together with pt developed plan to maximize pt's hydration/nutrition mitigating aspiration risk as much as able.  Follow up indicated to determine tolerance.      SLP Plan  Continue with current plan of care       Recommendations  Diet recommendations: Dysphagia 1 (puree);Nectar-thick liquid(ensure and tomato soup with meals; thin water between meals ) Liquids provided via: Cup;Straw Medication Administration: Whole meds with puree Supervision: Staff to assist with self feeding;Full supervision/cueing for compensatory strategies Compensations: Slow rate;Small sips/bites(intermittent dry swallow) Postural Changes and/or Swallow Maneuvers: Seated upright 90 degrees;Upright 30-60 min after meal  Oral Care Recommendations: Oral care before and after PO(during intake) Follow up Recommendations: Inpatient Rehab Plan: Continue with current plan of care       GO                Kimball, Tamara Ann 12/07/2019, 3:32  PM  [name], MS CCC SLP Acute Rehab Services Office [phone]  

## 2019-12-07 NOTE — Discharge Summary (Signed)
Physician Discharge Summary  Stephen Wade ION:[id number] DOB: [birth year] DOA: 11/27/2019  PCP: Colon Branch, MD  Admit date: 11/27/2019 Discharge date: 12/07/2019   Code Status: Full Code  Admitted From: Home Discharged to:  CIR Discharge Condition: Improved   Recommendations   1. Patient had slight change in mental status and hypothermic prior to discharge to CIR without obvious source of infection and currently on antibiotics. CXR and basic labs ordered and need follow up.  2. Continued on Augmentin x 2 more days 3. Patient needs follow up with his MS neurologist at discharge as he has not been taking his outpatient MS meds and should be restarted 4. Can continue his foley at discharge until follow up with urology.  Hospital Summary  This is a 68 year old male with past medical history of MS (not taking his medication on a wheelchair-bound at baseline and with baseline R>L weakness), GERD, recurrent UTIs and self-catheterization for neurogenic bladder who presented to the ED via EMS for concern of worsening weakness confusion the last 1 week prior to arrival as well as increased weakness and difficulty with transfers.  Patient was noted to be drowsy in the ED after receiving Ativan prior to MRI brain.  Home health aide had tested positive for COVID-19 1 month ago, patient tested negative x2.  12/5-6: Found to be in respiratory distress with increased O2 demands, tachycardia, hypothermia, rapid response was called.  EKG showing atrial fibrillation with RVR and heart rates 120s to 140s.  He was started on IV Cardizem and required nonrebreather mask.  Bear hugger for hypothermia.  ABG: pH 7.3, PCO2 43, PO2 86, O2 96 CXR with new right lower lobe infiltrate concerning for aspiration pneumonia.  PCCM was consulted and patient was placed on phenylephrine due to hypotension (discontinued 12/7).  12/9: Transferred from stepdown to med telemetry  Patient remained hemodynamically stable on room air  however had recurrence of hypothermia (30F) on 12/15. No leukocytosis, but did have signs of dehydration and was started on D5W prior to transfer to CIR. CXR, BMP, UA ordered and should be followed up at CIR.  A & P   Principal Problem:   Acute respiratory failure with hypoxia (HCC) Active Problems:   GERD   BPH - self caths   Spastic diplegia (HCC)   Multiple sclerosis (HCC)   UTI (urinary tract infection)   Acute metabolic encephalopathy   Pneumonia   Atrial fibrillation with RVR (HCC)  Acute Encephalopathy Hypothermia suspect secondary to mild hypernatremia (Na 146 with Cl 113) due to decreased PO intake as patient did not like the thickened water and has not been drinking over the past few days. He was started on D5W prior to transfer to CIR. This was discussed with the accepting facility. He did have his diet advanced to Dys1/extra gravy/sauces with Ensure,V8 or tomato juice with meals and allow of thin water between meals advised to maximize hydration with mitigation of risks on 12/15. This was discussed with accepting facility and family and he was cleared for discharge to CIR -Follow up on CXR -Follow up on BMP -Follow up on UA -Consider re-aspiration event with recent diet change -Baclofen held at discharge  Diarrhea, no constipation Improved -MiraLAX as needed -Senna-Colace daily  Generalized weakness secondary to debility and MS -CIR  Acute respiratory distress and acute hypoxic respiratory failure, secondary to multifocal pneumonia in setting of MS and A. fib with RVR resolved and off vasopressors since 12/7 Significant event 12/5 -12/6, hypothermic, in respiratory  distress, tachycardia, tachypnea, but was placed on NRB, met sepsis criteria. Patient was placed on phenylephrine due to hypotension with BP in 70s to 80s.CCM (Dr. Lamonte Sakai) was consulted, patient DNR/DNI after discussion with patient's wife. COVID-19 test negative, BNP 85, CRP 5.8, ferritin 430, D-dimer  0.78 (CTA chest negative for PE but with multifocal pneumonia). Cortisol level normal. IV Rocephin was discontinued, patient was placed on IV vancomycin and Zosyn. MRSA negative, narrowed to IV Zosyn which was changed to Augmentin.  (Day 9/10 antibiotics). Leukocytosis improved. Leukocytosis yesterday and today improved, likely steroid-induced as he is afebrile Failed SLP evaluation last week -Continue augmentin for total 10 days antibiotics  Group B strepUTI in setting of recurrent UTIs with history of suspected neurogenic bladder from MS and self catheterizations  Present on admission, currently improved and with Foley -At discharge, patient can continue Foley catheter and follow-up with urology outpatient for consideration of suprapubic catheter -antibiotics As above  Atrial fibrillation with RVR on the night 12/5-12/6, was placed on IV Cardizem drip, turned off due to hypotension.  CHA2DS2-VASc: 1. Now in NSR -hold off on anticoagulation at this time  Pressure injury Stage I with nonblanchable redness with small blistered areas to the mid bilateral buttocks, present on admission -Wound care per nursing  Goals of care in the setting of worsening MS, multifocal pneumonia, respiratory failure, aspiration Palliative medicine recently consulted for [place] and patient had changed CODE STATUS to DNR in setting of rapid response as above.   Discussion with patient and wife at bedside 12/10 who wish to change CODE STATUS to full code now that he has had improved clinical status.  Right eye viral conjunctivitis Resolved with eyedrops  Hypoglycemia resolved with D5W  Hypernatremia resolved with D5W  Neurogenic bladder secondary to MS self catheterizes 5+ times per day.  Has Foley currently -At discharge, patient can continue Foley and follow-up with urology outpatient to discuss suprapubic catheter  Multiple sclerosis likely in pseudoflare given worsening baseline symptoms, unchanged  MRI brain and acute infection as above. Follows with Dr. Felecia Shelling, neurology, outpatient.  Not taking his ocrelizumab.  On day 5/5 iv Solu-Medrol -Completed steroids - CIR -Needs outpatient follow-up with neurology    Consultants  Palliative care PCCM PT SLP  Procedures  . none  Antibiotics   Anti-infectives (From admission, onward)   Start     Dose/Rate Route Frequency Ordered Stop   12/07/19 0000  amoxicillin-clavulanate (AUGMENTIN) 875-125 MG tablet     1 tablet Oral Every 12 hours 12/07/19 1355     12/02/19 2200  amoxicillin-clavulanate (AUGMENTIN) 875-125 MG per tablet 1 tablet     1 tablet Oral Every 12 hours 12/02/19 1406     12/02/19 1500  amoxicillin (AMOXIL) capsule 500 mg  Status:  Discontinued     500 mg Oral Every 8 hours 12/02/19 1403 12/02/19 1406   11/28/19 2300  vancomycin (VANCOCIN) IVPB 750 mg/150 ml premix  Status:  Discontinued     750 mg 150 mL/hr over 60 Minutes Intravenous Every 12 hours 11/28/19 0954 11/28/19 1041   11/28/19 1800  piperacillin-tazobactam (ZOSYN) IVPB 3.375 g  Status:  Discontinued     3.375 g 12.5 mL/hr over 240 Minutes Intravenous Every 8 hours 11/28/19 1122 12/02/19 1403   11/28/19 1400  cefTRIAXone (ROCEPHIN) 1 g in sodium chloride 0.9 % 100 mL IVPB  Status:  Discontinued     1 g 200 mL/hr over 30 Minutes Intravenous Every 24 hours 11/27/19 2133 11/28/19 0914   11/28/19  1100  vancomycin (VANCOCIN) 2,000 mg in sodium chloride 0.9 % 500 mL IVPB  Status:  Discontinued     2,000 mg 250 mL/hr over 120 Minutes Intravenous  Once 11/28/19 0947 11/28/19 1041   11/28/19 1000  ceFEPIme (MAXIPIME) 2 g in sodium chloride 0.9 % 100 mL IVPB  Status:  Discontinued     2 g 200 mL/hr over 30 Minutes Intravenous Every 8 hours 11/28/19 0946 11/28/19 1041   11/27/19 1415  cefTRIAXone (ROCEPHIN) 1 g in sodium chloride 0.9 % 100 mL IVPB     1 g 200 mL/hr over 30 Minutes Intravenous  Once 11/27/19 1406 11/27/19 1520        Subjective  Patient and  wife at bedside. Patient is resting comfortably. Denies any complaints and states he feels fine. Patient noted to have hypothermia and refusing heated blankets. Also noted to have some confusion as above by wife. No other complaints  Objective   Discharge Exam: Vitals:   12/07/19 1235 12/07/19 1236  BP:  135/67  Pulse:  64  Resp:  16  Temp: (!) 95.1 F (35.1 C)   SpO2:  94%   Vitals:   12/07/19 0030 12/07/19 1019 12/07/19 1235 12/07/19 1236  BP:    135/67  Pulse:    64  Resp:    16  Temp: (!) 95.1 F (35.1 C) (!) 94.5 F (34.7 C) (!) 95.1 F (35.1 C)   TempSrc: Rectal Rectal Rectal   SpO2:    94%  Weight:        Physical Exam Vitals and nursing note reviewed. Exam conducted with a chaperone present.  Constitutional:      General: He is not in acute distress.    Appearance: He is not ill-appearing or toxic-appearing.  HENT:     Head: Normocephalic and atraumatic.     Mouth/Throat:     Mouth: Mucous membranes are moist.  Eyes:     Extraocular Movements: Extraocular movements intact.     Pupils: Pupils are equal, round, and reactive to light.  Cardiovascular:     Rate and Rhythm: Normal rate and regular rhythm.  Pulmonary:     Effort: Pulmonary effort is normal.     Breath sounds: Normal breath sounds.  Abdominal:     General: Abdomen is flat.     Palpations: Abdomen is soft.  Musculoskeletal:        General: No swelling or tenderness.     Cervical back: Normal range of motion.  Neurological:     Mental Status: He is alert.     Comments: AAOx2 not to place  Psychiatric:        Mood and Affect: Mood normal.        Behavior: Behavior normal.        Thought Content: Thought content normal.        Judgment: Judgment normal.       The results of significant diagnostics from this hospitalization (including imaging, microbiology, ancillary and laboratory) are listed below for reference.     Microbiology: Recent Results (from the past 240 hour(s))  Culture,  blood (routine x 2)     Status: None   Collection Time: 11/27/19  6:17 PM   Specimen: BLOOD  Result Value Ref Range Status   Specimen Description   Final    BLOOD RIGHT ANTECUBITAL Performed at [facility] [address], [place], [place] [address]    Special Requests   Final    BOTTLES  DRAWN AEROBIC AND ANAEROBIC Blood Culture adequate volume Performed at [facility] [address] [place], [place] [address]    Culture   Final    NO GROWTH 5 DAYS Performed at [facility], [place] [address] [place], [place] [address]    Report Status 12/02/2019 FINAL  Final  Culture, blood (routine x 2)     Status: None   Collection Time: 11/27/19  6:17 PM   Specimen: BLOOD  Result Value Ref Range Status   Specimen Description   Final    BLOOD RIGHT HAND Performed at [facility] [address] [place], [place] [address]    Special Requests   Final    BOTTLES DRAWN AEROBIC AND ANAEROBIC Blood Culture adequate volume Performed at [facility] [address] [place], [place] [address]    Culture   Final    NO GROWTH 5 DAYS Performed at [facility], [place] [address], [place], [place] [address]    Report Status 12/02/2019 FINAL  Final  MRSA PCR Screening     Status: None   Collection Time: 11/27/19 11:25 PM   Specimen: Nasal Mucosa; Nasopharyngeal  Result Value Ref Range Status   MRSA by PCR NEGATIVE NEGATIVE Final    Comment:        The GeneXpert MRSA Assay (FDA approved for NASAL specimens only), is one component of a comprehensive MRSA colonization surveillance program. It is not intended to diagnose MRSA infection nor to guide or monitor treatment for MRSA infections. Performed at [facility], [place] [address], [place], [place] [address]      Labs: BNP (last 3 results) Recent Labs    11/28/19 0618  BNP 32.9   Basic Metabolic Panel: Recent Labs  Lab  12/02/19 0536 12/04/19 0511 12/05/19 0808 12/06/19 0513 12/07/19 0834 12/07/19 1331  NA 142 145 144 145 146* 145  K 3.8 4.1 3.9 4.0 4.1 3.8  CL 110 111 112* 114* 115* 113*  CO2 22 23 23 23 23 23   GLUCOSE 77 108* 107* 110* 107* 168*  BUN 14 21 27* 31* 34* 33*  CREATININE 0.89 0.76  0.78 0.76 0.76 0.77 0.69  CALCIUM 9.6 9.7 9.5 9.2 9.1 9.2  MG 1.9  --   --   --   --   --    Liver Function Tests: No results for input(s): AST, ALT, ALKPHOS, BILITOT, PROT, ALBUMIN in the last 168 hours. No results for input(s): LIPASE, AMYLASE in the last 168 hours. Recent Labs  Lab 12/07/19 1331  AMMONIA 35   CBC: Recent Labs  Lab 12/03/19 0531 12/04/19 1017 12/05/19 0808 12/06/19 0513 12/07/19 0834  WBC 3.3* 14.0* 12.1* 10.6* 9.4  HGB 12.9* 12.9* 12.5* 11.9* 11.9*  HCT 38.7* 38.1* 37.2* 36.7* 36.2*  MCV 92.6 92.3 91.2 94.6 94.5  PLT 157 200 235 222 239   Cardiac Enzymes: No results for input(s): CKTOTAL, CKMB, CKMBINDEX, TROPONINI in the last 168 hours. BNP: Invalid input(s): POCBNP CBG: Recent Labs  Lab 12/01/19 0004 12/01/19 0409 12/01/19 0746 12/01/19 1251 12/01/19 1608  GLUCAP 112* 88 93 74 118*   D-Dimer No results for input(s): DDIMER in the last 72 hours. Hgb A1c No results for input(s): HGBA1C in the last 72 hours. Lipid Profile No results for input(s): CHOL, HDL, LDLCALC, TRIG, CHOLHDL, LDLDIRECT in the last 72 hours. Thyroid function studies No results for input(s): TSH, T4TOTAL, T3FREE, THYROIDAB in the last 72 hours.  Invalid input(s): FREET3 Anemia work up No results for input(s): VITAMINB12, FOLATE, FERRITIN, TIBC, IRON, RETICCTPCT in the last 72 hours. Urinalysis    Component Value Date/Time   COLORURINE YELLOW 11/27/2019 0949   APPEARANCEUR HAZY (A) 11/27/2019 0949   LABSPEC 1.020 11/27/2019 0949   PHURINE 7.0 11/27/2019 0949   GLUCOSEU NEGATIVE 11/27/2019 0949   GLUCOSEU NEGATIVE 03/11/2013 1106   HGBUR SMALL (A) 11/27/2019 0949   HGBUR large  10/05/2010 1242   BILIRUBINUR NEGATIVE 11/27/2019 0949   BILIRUBINUR neg 03/11/2013 1039   KETONESUR NEGATIVE 11/27/2019 0949   PROTEINUR NEGATIVE 11/27/2019 0949   UROBILINOGEN 0.2 03/17/2013 1005   NITRITE POSITIVE (A) 11/27/2019 0949   LEUKOCYTESUR LARGE (A) 11/27/2019 0949   Sepsis Labs Invalid input(s): PROCALCITONIN,  WBC,  LACTICIDVEN Microbiology Recent Results (from the past 240 hour(s))  Culture, blood (routine x 2)     Status: None   Collection Time: 11/27/19  6:17 PM   Specimen: BLOOD  Result Value Ref Range Status   Specimen Description   Final    BLOOD RIGHT ANTECUBITAL Performed at [facility], [place] [address] [place], [place] [address]    Special Requests   Final    BOTTLES DRAWN AEROBIC AND ANAEROBIC Blood Culture adequate volume Performed at [facility] [address], [place], [place] [address]    Culture   Final    NO GROWTH 5 DAYS Performed at [facility], [place] [address], [place], [place] [address]    Report Status 12/02/2019 FINAL  Final  Culture, blood (routine x 2)     Status: None   Collection Time: 11/27/19  6:17 PM   Specimen: BLOOD  Result Value Ref Range Status   Specimen Description   Final    BLOOD RIGHT HAND Performed at [facility] [address], [place], [place] [address]    Special Requests   Final    BOTTLES DRAWN AEROBIC AND ANAEROBIC Blood Culture adequate volume Performed at [facility] [address] [place], [place] [address]    Culture   Final    NO GROWTH 5 DAYS Performed at [facility], [place] [address], [place], [place] [address]    Report Status 12/02/2019 FINAL  Final  MRSA PCR Screening     Status: None   Collection Time: 11/27/19 11:25 PM   Specimen: Nasal Mucosa; Nasopharyngeal  Result Value Ref Range Status   MRSA by PCR NEGATIVE NEGATIVE Final    Comment:        The GeneXpert MRSA Assay (FDA approved for NASAL  specimens only), is one component of a comprehensive MRSA colonization surveillance program. It is not intended to diagnose MRSA infection nor to guide or monitor treatment for MRSA infections. Performed at [facility], [place] [address] [place], [place] [address]     Discharge Instructions     Discharge Instructions    Increase activity slowly   Complete by: As directed      Allergies as of 12/07/2019   No Known Allergies     Medication List    STOP taking these medications   baclofen 10 MG tablet Commonly known as: LIORESAL     TAKE these medications   amoxicillin-clavulanate 875-125 MG tablet Commonly known as: AUGMENTIN Take 1 tablet by mouth every 12 (twelve) hours.   Ocrevus 300 MG/10ML injection Generic drug: ocrelizumab Inject 300 mg every 6 (six) months into the vein.   omeprazole  20 MG capsule Commonly known as: PRILOSEC Take 20 mg daily as needed by mouth (for acid reflex).   polyethylene glycol 17 g packet Commonly known as: MIRALAX / GLYCOLAX Take 17 g by mouth daily as needed for mild constipation or moderate constipation.   senna-docusate 8.6-50 MG tablet Commonly known as: Senokot-S Take 1 tablet by mouth at bedtime.      Follow-up [place], [place].   Specialty: Internal Medicine Contact information: [address] [place] [address] [phone]          No Known Allergies  Time coordinating discharge: Over 30 minutes  SIGNED:   Harold Hedge, D.O. [facility] Pager: [phone]  12/07/2019, 2:28 PM

## 2019-12-07 NOTE — Progress Notes (Signed)
Courtney Heys, MD  Physician  Physical Medicine and Rehabilitation  PMR Pre-admission  Signed  Date of Service:  12/07/2019  9:22 AM      Related encounter: ED to Hosp-Admission (Discharged) from 11/27/2019 in [place]        PMR Admission Coordinator Pre-Admission Assessment   Patient: Stephen Wade is an 68 y.o., male MRN: [id number] DOB: 1951-03-08 Height:   Weight: 95.9 kg   Insurance Information HMO:     PPO:      PCP:      IPA:      80/20: yes      OTHER:  PRIMARY: Medicare part A and B      Policy#: [id number]      Subscriber: Patient CM Name:       Phone#:      Fax#:  Pre-Cert#:       Employer:  Benefits:  Phone #: online     Name: verified eligibility online via [place] on 12/07/2019 Eff. Date: Part A and B effective 12/23/2017     Deduct: $1,408      Out of Pocket Max: NA      Life Max: NA CIR: Covered per Medicare guidelines once yearly deductible has been met.      SNF: days 1-20, 100%, days 21-100, 80% Outpatient: 80%     Co-Pay: 20% Home Health: 100%      Co-Pay:  DME: 80%     Co-Pay: 20% Providers: Pt's choice SECONDARY: [employer]      Policy#: [id number]      Subscriber: Patient CM Name:       Phone#:      Fax#:  Pre-Cert#:       Employer:  Benefits:  Phone #: [phone]     Name:  Eff. Date:      Deduct:       Out of Pocket Max:       Life Max:  CIR:       SNF:  Outpatient:      Co-Pay:  Home Health:       Co-Pay:  DME:      Co-Pay:    Medicaid Application Date:       Case Manager:  Disability Application Date:       Case Worker:    The "Data Collection Information Summary" for patients in Inpatient Rehabilitation Facilities with attached "Privacy Act [place] Records" was provided and verbally reviewed with: Patient and Family   Emergency Contact Information         Contact Information     Name Relation Home Work [place]     [phone]           Current Medical History  Patient Admitting Diagnosis: Pseudoexacerbation of MS   History of Present Illness: Stephen Wade is a 60 year old right-handed male with history of multiple sclerosis followed by [name].  Recurrent lower urinary tract symptoms/self caths followed by Dr. Gaynelle Arabian, remote tobacco abuse.  He has been essentially nonambulatory for 8 years.  Presented 11/27/2019 with increased weakness decreased functional mobility.  By report patient had recently stopped taking his Ocrelizumab..  Cranial CT/MRI no new intracranial abnormality stable since 2018 with changes compatible with chronic demyelination.  Chest x-ray did show infiltrate right base representing pneumonia versus aspiration.  CT angiogram of the chest negative for pulmonary emboli.  Patient was placed on a 5-day course of  IV Solu-Medrol for pseudoflare of multiple sclerosis.  Admission chemistries hemoglobin 13.8/hematocrit 42.1, sodium 146, BUN 30, SARS coronavirus negative, urine culture greater 100,000 group B strep ( S.Agalactiae).  EKG showed atrial fibrillation RVR heart rates 120s to 140s he was started on IV Cardizem initially did require nonrebreather mask.  He was transferred to stepdown telemetry 12/01/2019.  His Cardizem was later discontinued due to bouts of hypotension no current plan for anticoagulation at this time.  Initially maintained on IV Rocephin transition to vancomycin and Zosyn.  MRSA negative and Zosyn was changed to Augmentin completing a 10-day course.  In regards to patient's recurrent UTIs now with urinary tract infection a Foley catheter tube was placed plan was to follow-up outpatient urology services for consideration of suprapubic catheter.FOLEY TUBE IS not to be removed.  Subcutaneous Lovenox for DVT prophylaxis.  Noted pressure injury blistered area mid bilateral buttocks with generalized wound care.  Palliative care was consulted to establish goals of care.  Patient is currently on dysphagia #1  nectar thick liquid diet and close monitoring of hydration with lattest BUN 34 up from 31-27-21  and received a fluid bolus 12/07/2019.  Therapy evaluations completed and patient is to be admitted for a comprehensive rehab program on 12/07/2019.      Patient's medical record from [facility] has been reviewed by the rehabilitation admission coordinator and physician.   Past Medical History      Past Medical History:  Diagnosis Date  . GERD (gastroesophageal reflux disease)      had on- off chest pain, symptoms resolved with Prilosec 11/2010  . Lower urinary tract symptoms (LUTS)      Dr Gaynelle Arabian  . MS (multiple sclerosis) ([place]) 2005  . Testicular hypogonadism    . Urge incontinence of urine    . Urolithiasis 2011      Family History   family history includes Brain cancer in his father; Heart attack in his mother.   Prior Rehab/Hospitalizations Has the patient had prior rehab or hospitalizations prior to admission? No   Has the patient had major surgery during 100 days prior to admission? No               Current Medications   Current Facility-Administered Medications:  .  0.9 %  sodium chloride infusion, 250 mL, Intravenous, Continuous, Byrum, Rose Fillers, MD, Last Rate: 10 mL/hr at 12/01/19 1800, Rate Verify at 12/01/19 1800 .  acetaminophen (TYLENOL) tablet 650 mg, 650 mg, Oral, Q6H PRN **OR** acetaminophen (TYLENOL) suppository 650 mg, 650 mg, Rectal, Q6H PRN, Rai, Ripudeep K, MD .  amoxicillin-clavulanate (AUGMENTIN) 875-125 MG per tablet 1 tablet, 1 tablet, Oral, Q12H, Harold Hedge, MD, 1 tablet at 12/07/19 0932 .  baclofen (LIORESAL) tablet 5 mg, 5 mg, Oral, TID, Harold Hedge, MD, 5 mg at 12/07/19 0932 .  chlorhexidine (PERIDEX) 0.12 % solution 15 mL, 15 mL, Mouth Rinse, BID, Harold Hedge, MD, 15 mL at 12/07/19 0932 .  Chlorhexidine Gluconate Cloth 2 % PADS 6 each, 6 each, Topical, Q0600, Rai, Ripudeep K, MD, 6 each at 12/04/19 0618 .  dextrose 5 % solution, ,  Intravenous, Continuous, Harold Hedge, MD, Last Rate: 100 mL/hr at 12/07/19 1103, New Bag at 12/07/19 1103 .  enoxaparin (LOVENOX) injection 40 mg, 40 mg, Subcutaneous, Q24H, Rai, Ripudeep K, MD, 40 mg at 12/06/19 2215 .  feeding supplement (ENSURE ENLIVE) (ENSURE ENLIVE) liquid 237 mL, 237 mL, Oral, TID WC, Harold Hedge, MD, 237 mL  at 12/07/19 1104 .  MEDLINE mouth rinse, 15 mL, Mouth Rinse, q12n4p, Harold Hedge, MD, 15 mL at 12/07/19 1104 .  naphazoline-glycerin (CLEAR EYES REDNESS) ophth solution 1-2 drop, 1-2 drop, Both Eyes, QID PRN, Loistine Chance, MD, 1 drop at 12/02/19 2214 .  ondansetron (ZOFRAN) tablet 4 mg, 4 mg, Oral, Q6H PRN **OR** ondansetron (ZOFRAN) injection 4 mg, 4 mg, Intravenous, Q6H PRN, Rai, Ripudeep K, MD .  pantoprazole (PROTONIX) EC tablet 40 mg, 40 mg, Oral, QHS, Green, Terri L, RPH, 40 mg at 12/06/19 2214 .  polyethylene glycol (MIRALAX / GLYCOLAX) packet 17 g, 17 g, Oral, Daily PRN, Harold Hedge, MD, 17 g at 12/05/19 1633 .  polyvinyl alcohol (LIQUIFILM TEARS) 1.4 % ophthalmic solution 1 drop, 1 drop, Right Eye, TID, Harold Hedge, MD, 1 drop at 12/07/19 1105 .  Resource ThickenUp Clear, , Oral, PRN, Rai, Ripudeep K, MD .  senna-docusate (Senokot-S) tablet 1 tablet, 1 tablet, Oral, QHS, Harold Hedge, MD, 1 tablet at 12/06/19 2212   Patients Current Diet:     Diet Order                      DIET - DYS 1 Room service appropriate? Yes with Assist; Fluid consistency: Nectar Thick  Diet effective now                   Precautions / Restrictions Precautions Precautions: Fall Precaution Comments: hx MS (R LE deficits > L LE, spastic diplegia) Restrictions Weight Bearing Restrictions: No    Has the patient had 2 or more falls or a fall with injury in the past year? Yes   Prior Activity Level   Prior Functional Level Self Care: Did the patient need help bathing, dressing, using the toilet or eating? Needed some help   Indoor Mobility: Did the patient  need assistance with walking from room to room (with or without device)? Independent   Stairs: Did the patient need assistance with internal or external stairs (with or without device)? Dependent   Functional Cognition: Did the patient need help planning regular tasks such as shopping or remembering to take medications? Needed some help   Home Assistive Devices / [place] Devices/Equipment: Other (Comment), Built-in shower seat, Eyeglasses, Hospital bed, Grab bars around toilet, Grab bars in shower, Hand-held shower hose(manual wheelchair, electric wheelchair, ramp entrance, walk-in shower, lift chair, stair lift, catheter supplies, generator, handicap van) Home Equipment: Transport planner, Wheelchair - manual, [place] [facility] bed   Prior Device Use: Indicate devices/aids used by the patient prior to current illness, exacerbation or injury? scooter for house mobility, lift for scooter into house, bariatric BSC, shower bench,    Current Functional Level Cognition   Overall Cognitive Status: Within Functional Limits for tasks assessed Orientation Level: Oriented X4    Extremity Assessment (includes Sensation/Coordination)   Upper Extremity Assessment: RUE deficits/detail RUE Deficits / Details: keeps RUE in flexion position at rest but able to extend elbow and use minimally during bed mobility/sitting EOB. limited shoulder flexion/ABD. Impaired gross and fine motor coordination RUE Coordination: decreased fine motor, decreased gross motor LUE Deficits / Details: generalized weakness but able to use functionally more then RUE.  LUE Coordination: decreased fine motor, decreased gross motor  Lower Extremity Assessment: Defer to PT evaluation RLE Deficits / Details: spasticity present with attempts to touch or assist R LE, pt unable to perform any active movement with LEs     ADLs  Overall ADL's : Needs assistance/impaired Eating/Feeding: Minimal assistance, Set  up, Bed level Grooming: Moderate assistance, Bed level Upper Body Bathing: Sitting, Maximal assistance Upper Body Bathing Details (indicate cue type and reason): able to sit EOB with BUE support and min to max A Lower Body Bathing: Total assistance, +2 for physical assistance, Bed level, Sitting/lateral leans Upper Body Dressing : Maximal assistance, Sitting Upper Body Dressing Details (indicate cue type and reason): able to sit EOB with BUE support and min to max A Lower Body Dressing: Total assistance, Sitting/lateral leans, Bed level, +2 for physical assistance General ADL Comments: Pt completed bed mobility with +2 max - total A, sat EOB several minutes with min to mod A +2 s/e, physical A. Pt able to facilitate scooting towards HOB (towards stronger side).      Mobility   Overal bed mobility: Needs Assistance Bed Mobility: Supine to Sit, Sit to Supine Supine to sit: Max assist, +2 for physical assistance, HOB elevated Sit to supine: Total assist, +2 for physical assistance General bed mobility comments: assist for BLE off bed and uprighting trunk, pt attempts to assist by reaching for bedrails and pulling on spouse's hand     Transfers   General transfer comment: not tested for safety     Ambulation / Gait / Stairs / Wheelchair Mobility         Posture / Balance Dynamic Sitting Balance Sitting balance - Comments: seated EOB x15 minutes, use of BUE on bed and therapist physically assisting to maintain normal postural alignment, overall max A with moments of 5-10 sec increments of contact guard assist, loss of balance backwards and to left requiring assist from therapist to recover normal postural alignment Balance Overall balance assessment: Needs assistance Sitting-balance support: Bilateral upper extremity supported, Single extremity supported, Feet supported Sitting balance-Leahy Scale: Poor Sitting balance - Comments: seated EOB x15 minutes, use of BUE on bed and therapist  physically assisting to maintain normal postural alignment, overall max A with moments of 5-10 sec increments of contact guard assist, loss of balance backwards and to left requiring assist from therapist to recover normal postural alignment     Special needs/care consideration BiPAP/CPAP: no CPM : no Continuous Drip IV: no Dialysis : no        Days : no Life Vest : no Oxygen : no, on RA Special Bed : no Trach Size : no Wound Vac (area) : no      Location : no Skin : ecchymosis to bilateral hip, MASD to buttocks, coccyx, sacrum (right, left, mid), skin tear to mid buttocks, stage 1 pressure injury to mid bilateral buttocks.             Bowel mgmt: continent, last BM: 12/04/2019 Bladder mgmt: urinary catheter in place Diabetic mgmt: no Behavioral consideration : no Chemo/radiation : no    Previous Home Environment (from acute therapy documentation) Living Arrangements: Spouse/significant other Available Help at Discharge: Family Type of Home: House Home Layout: Able to live on main level with bedroom/bathroom Home Access: Ramped entrance [place]: No Additional Comments: sleeps in lazyboy lift chair   Discharge Living Setting Plans for Discharge Living Setting: Patient's home, Lives with (comment)(wife, plans to have hired aide daily) Type of Home at Discharge: House Discharge Home Layout: Two level, Full bath on main level, Able to live on main level with bedroom/bathroom Alternate Level Stairs-Rails: None Alternate Level Stairs-Number of Steps: NA Discharge Home Access: Ramped entrance, Other (comment)(has scooter lift ) Discharge Bathroom  Shower/Tub: Walk-in shower(with bench) Discharge Bathroom Toilet: Handicapped height(uses bariatric BSC next to bed) Discharge Bathroom Accessibility: Yes How Accessible: Accessible via walker(can get scooter into bathroom for shower transfer) Does the patient have any problems obtaining your medications?: No     Social/Family/Support Systems Patient Roles: Spouse Contact Information: Magda Paganini (wife): [phone] Anticipated Caregiver: wife + hired Geophysicist/field seismologist Information: see above Ability/Limitations of Caregiver: Min/Mod A (would like training) Caregiver Availability: 24/7 Discharge Plan Discussed with Primary Caregiver: Yes Is Caregiver In Agreement with Plan?: Yes Does Caregiver/Family have Issues with Lodging/Transportation while Pt is in Rehab?: No   Goals/Additional Needs Patient/Family Goal for Rehab: PT: Min G transfers, OT: Min A/Mod A; SLP: Min A Expected length of stay: 16-20 days Cultural Considerations: NA Dietary Needs: DYS 1, nectar thick liquids (in V8 juice or Tomato juice ONLY, dietician to order nutritional supplements as indicated per patient diet order and condition). Equipment Needs: TBD Special Service Needs: hoyer lift Pt/Family Agrees to Admission and willing to participate: Yes Program Orientation Provided & Reviewed with Pt/Caregiver Including Roles  & Responsibilities: Yes(wife and patietn)  Barriers to Discharge: Neurogenic Bowel & Bladder  Barriers to Discharge Comments: may need subrapubic cath at some point (per notes)   Decrease burden of Care through IP rehab admission: NA   Possible need for SNF placement upon discharge: Not anticipated, pt plans to DC home with 24/7 A from wife + (plans for hired aide). Pt has necessary equipment needs at DC with scooter, scooter lift to get into the house, bariatric BSC, shower bench, and has had recommended support persons in place for years to assist with care. Goal is to DC home after CIR stay with support in place and caregiver training.    Patient Condition: I have reviewed medical records from [facility], spoken with RN, MD, and patient and spouse. I met with patient at the bedside for inpatient rehabilitation assessment.  Patient will benefit from ongoing PT, OT and SLP, can actively  participate in 3 hours of therapy a day 5 days of the week, and can make measurable gains during the admission.  Patient will also benefit from the coordinated team approach during an Inpatient Acute Rehabilitation admission.  The patient will receive intensive therapy as well as Rehabilitation physician, nursing, social worker, and care management interventions.  Due to bladder management, bowel management, safety, skin/wound care, disease management, medication administration, pain management and patient education the patient requires 24 hour a day rehabilitation nursing.  The patient is currently Max A + 2 for bed mobility (transfers not attempted) and Min to total A +2 for basic ADLs.  Discharge setting and therapy post discharge at home with home health is anticipated.  Patient has agreed to participate in the Acute Inpatient Rehabilitation Program and will admit today.   Preadmission Screen Completed By:  Raechel Ache, 12/07/2019 1:25 PM ______________________________________________________________________   Discussed status with Dr. Dagoberto Ligas on 12/07/2019 at 2:15PM and received approval for admission today.   Admission Coordinator:  Raechel Ache, OT, time 2:15PM/Date 12/07/2019.    Assessment/Plan: Diagnosis: 1. Does the need for close, 24 hr/day Medical supervision in concert with the patient's rehab needs make it unreasonable for this patient to be served in a less intensive setting? Yes 2. Co-Morbidities requiring supervision/potential complications: MS Afib with RVR, pneumonia, spasticity, Azotemia 3. Due to bladder management, bowel management, safety, skin/wound care, disease management, medication administration and patient education, does the patient require 24 hr/day rehab nursing? Yes 4. Does  the patient require coordinated care of a physician, rehab nurse, PT, OT, and SLP to address physical and functional deficits in the context of the above medical diagnosis(es)? Yes Addressing  deficits in the following areas: balance, endurance, strength, transferring, bowel/bladder control, bathing, dressing, feeding, grooming and toileting 5. Can the patient actively participate in an intensive therapy program of at least 3 hrs of therapy 5 days a week? Yes 6. The potential for patient to make measurable gains while on inpatient rehab is fair 7. Anticipated functional outcomes upon discharge from inpatient rehab: min assist PT, min assist OT, supervision SLP 8. Estimated rehab length of stay to reach the above functional goals is:16-20 days 9. Anticipated discharge destination: Home 10. Overall Rehab/Functional Prognosis: fair     MD Signature:          Revision History Date/Time User Provider Type Action  12/07/2019  2:20 PM Courtney Heys, MD Physician Sign  12/07/2019  2:15 PM Raechel Ache, [facility] Rehab Admission Coordinator Share   View Details Report

## 2019-12-07 NOTE — Progress Notes (Signed)
Patient admitted to the unit, he arrived on the unit on stretcher via EMS, he was oriented on the unit, Assessment done, call bell within reach A&O 4, denied pain at the time

## 2019-12-08 ENCOUNTER — Inpatient Hospital Stay (HOSPITAL_COMMUNITY): Payer: Medicare Other

## 2019-12-08 ENCOUNTER — Inpatient Hospital Stay (HOSPITAL_COMMUNITY): Payer: Medicare Other | Admitting: Physical Therapy

## 2019-12-08 ENCOUNTER — Inpatient Hospital Stay (HOSPITAL_COMMUNITY): Payer: Medicare Other | Admitting: Speech Pathology

## 2019-12-08 DIAGNOSIS — E8809 Other disorders of plasma-protein metabolism, not elsewhere classified: Secondary | ICD-10-CM

## 2019-12-08 DIAGNOSIS — R5381 Other malaise: Secondary | ICD-10-CM

## 2019-12-08 DIAGNOSIS — Z978 Presence of other specified devices: Secondary | ICD-10-CM

## 2019-12-08 DIAGNOSIS — N179 Acute kidney failure, unspecified: Secondary | ICD-10-CM

## 2019-12-08 DIAGNOSIS — E46 Unspecified protein-calorie malnutrition: Secondary | ICD-10-CM

## 2019-12-08 DIAGNOSIS — D62 Acute posthemorrhagic anemia: Secondary | ICD-10-CM

## 2019-12-08 DIAGNOSIS — E870 Hyperosmolality and hypernatremia: Secondary | ICD-10-CM

## 2019-12-08 DIAGNOSIS — R7401 Elevation of levels of liver transaminase levels: Secondary | ICD-10-CM

## 2019-12-08 DIAGNOSIS — T380X5A Adverse effect of glucocorticoids and synthetic analogues, initial encounter: Secondary | ICD-10-CM

## 2019-12-08 DIAGNOSIS — R1310 Dysphagia, unspecified: Secondary | ICD-10-CM

## 2019-12-08 DIAGNOSIS — R739 Hyperglycemia, unspecified: Secondary | ICD-10-CM

## 2019-12-08 LAB — URINALYSIS, ROUTINE W REFLEX MICROSCOPIC
Bilirubin Urine: NEGATIVE
Glucose, UA: NEGATIVE mg/dL
Ketones, ur: NEGATIVE mg/dL
Nitrite: NEGATIVE
Protein, ur: 100 mg/dL — AB
RBC / HPF: >50 RBC/hpf — ABNORMAL HIGH (ref 0–5)
Specific Gravity, Urine: 1.025 (ref 1.005–1.030)
WBC, UA: >50 WBC/hpf — ABNORMAL HIGH (ref 0–5)
pH: 6 (ref 5.0–8.0)

## 2019-12-08 LAB — COMPREHENSIVE METABOLIC PANEL
ALT: 159 U/L — ABNORMAL HIGH (ref 0–44)
AST: 56 U/L — ABNORMAL HIGH (ref 15–41)
Albumin: 2.9 g/dL — ABNORMAL LOW (ref 3.5–5.0)
Alkaline Phosphatase: 83 U/L (ref 38–126)
Anion gap: 11 (ref 5–15)
BUN: 31 mg/dL — ABNORMAL HIGH (ref 8–23)
CO2: 23 mmol/L (ref 22–32)
Calcium: 9.1 mg/dL (ref 8.9–10.3)
Chloride: 114 mmol/L — ABNORMAL HIGH (ref 98–111)
Creatinine, Ser: 0.75 mg/dL (ref 0.61–1.24)
GFR calc Af Amer: >60 mL/min (ref >60)
GFR calc non Af Amer: >60 mL/min (ref >60)
Glucose, Bld: 113 mg/dL — ABNORMAL HIGH (ref 70–99)
Potassium: 4.3 mmol/L (ref 3.5–5.1)
Sodium: 148 mmol/L — ABNORMAL HIGH (ref 135–145)
Total Bilirubin: 0.8 mg/dL (ref 0.3–1.2)
Total Protein: 5.5 g/dL — ABNORMAL LOW (ref 6.5–8.1)

## 2019-12-08 LAB — CBC WITH DIFFERENTIAL/PLATELET
Abs Immature Granulocytes: 0.37 10*3/uL — ABNORMAL HIGH (ref 0.00–0.07)
Basophils Absolute: 0 10*3/uL (ref 0.0–0.1)
Basophils Relative: 0 %
Eosinophils Absolute: 0 10*3/uL (ref 0.0–0.5)
Eosinophils Relative: 0 %
HCT: 35.9 % — ABNORMAL LOW (ref 39.0–52.0)
Hemoglobin: 11.9 g/dL — ABNORMAL LOW (ref 13.0–17.0)
Immature Granulocytes: 4 %
Lymphocytes Relative: 5 %
Lymphs Abs: 0.5 10*3/uL — ABNORMAL LOW (ref 0.7–4.0)
MCH: 30.4 pg (ref 26.0–34.0)
MCHC: 33.1 g/dL (ref 30.0–36.0)
MCV: 91.8 fL (ref 80.0–100.0)
Monocytes Absolute: 0.6 10*3/uL (ref 0.1–1.0)
Monocytes Relative: 7 %
Neutro Abs: 7.2 10*3/uL (ref 1.7–7.7)
Neutrophils Relative %: 84 %
Platelets: 250 10*3/uL (ref 150–400)
RBC: 3.91 MIL/uL — ABNORMAL LOW (ref 4.22–5.81)
RDW: 16.1 % — ABNORMAL HIGH (ref 11.5–15.5)
WBC: 8.6 10*3/uL (ref 4.0–10.5)
nRBC: 0.3 % — ABNORMAL HIGH (ref 0.0–0.2)

## 2019-12-08 MED ORDER — CHLORHEXIDINE GLUCONATE CLOTH 2 % EX PADS
6.0000 | MEDICATED_PAD | Freq: Two times a day (BID) | CUTANEOUS | Status: DC
  Administered 2019-12-09 – 2020-01-02 (×29): 6 via TOPICAL

## 2019-12-08 MED ORDER — PRO-STAT SUGAR FREE PO LIQD
30.0000 mL | Freq: Two times a day (BID) | ORAL | Status: DC
  Administered 2019-12-08 – 2019-12-22 (×18): 30 mL via ORAL
  Filled 2019-12-08 (×41): qty 30

## 2019-12-08 MED ORDER — PANTOPRAZOLE SODIUM 40 MG PO PACK
40.0000 mg | PACK | Freq: Every day | ORAL | Status: DC
  Administered 2019-12-08 – 2019-12-29 (×11): 40 mg
  Filled 2019-12-08 (×20): qty 20

## 2019-12-08 MED ORDER — SENNOSIDES-DOCUSATE SODIUM 8.6-50 MG PO TABS
1.0000 | ORAL_TABLET | Freq: Every day | ORAL | Status: DC
  Administered 2019-12-09 – 2019-12-25 (×17): 1 via ORAL
  Filled 2019-12-08 (×19): qty 1

## 2019-12-08 NOTE — Progress Notes (Addendum)
[facility] PHYSICAL MEDICINE & REHABILITATION PROGRESS NOTE  Subjective/Complaints: Patient seen laying in bed this morning.  He states he slept well overnight.  He states he is ready begin therapies.  ROS: Denies CP, SOB, N/V/D  Objective: Vital Signs: Blood pressure 134/73, pulse 61, temperature 97.7 F (36.5 C), resp. rate 18, height 6' (1.829 m), weight 91.7 kg, SpO2 95 %. DG CHEST PORT 1 VIEW  Result Date: 12/07/2019 CLINICAL DATA:  Cephalopathy. EXAM: PORTABLE CHEST 1 VIEW COMPARISON:  CT 11/29/2019.  Chest x-ray 11/28/2019. FINDINGS: Heart size stable. No pulmonary venous congestion. Bibasilar infiltrates, improved from prior exams. No pleural effusion or pneumothorax. IMPRESSION: Bibasilar pulmonary infiltrates, improved from prior exams. Continued follow-up exams to demonstrate complete clearing suggested. Electronically Signed   By: Maisie Fus  Register   On: 12/07/2019 13:47   Recent Labs    12/07/19 1614 12/08/19 0451  WBC 8.0 8.6  HGB 12.8* 11.9*  HCT 37.8* 35.9*  PLT 239 250   Recent Labs    12/07/19 1331 12/07/19 1614 12/08/19 0451  NA 145  --  148*  K 3.8  --  4.3  CL 113*  --  114*  CO2 23  --  23  GLUCOSE 168*  --  113*  BUN 33*  --  31*  CREATININE 0.69 0.92 0.75  CALCIUM 9.2  --  9.1    Physical Exam: BP 134/73 (BP Location: Right Arm)   Pulse 61   Temp 97.7 F (36.5 C)   Resp 18   Ht 6' (1.829 m)   Wt 91.7 kg   SpO2 95%   BMI 27.42 kg/m  Constitutional: No distress . Vital signs reviewed. HENT: Normocephalic.  Atraumatic. Eyes: EOMI. No discharge. Cardiovascular: No JVD. Respiratory: Normal effort.  No stridor. GI: Non-distended. Skin: Sacral ulcers not examined Psych: Slowed. GU: + Foley Musc: No edema in extremities.  No tenderness in extremities. Neuro: Alert Motor: Bilateral upper extremities: 4+-5/5 proximal distal Right lower extremity: 2 -/5 proximal distal Left lower extremity: Hip flexion, knee extension 2/5, ankle  dorsiflexion 2+/5 Sensation intact to light touch Dysarthria  Assessment/Plan: 1. Functional deficits secondary to debility which require 3+ hours per day of interdisciplinary therapy in a comprehensive inpatient rehab setting.  Physiatrist is providing close team supervision and 24 hour management of active medical problems listed below.  Physiatrist and rehab team continue to assess barriers to discharge/monitor patient progress toward functional and medical goals  Care Tool:  Bathing              Bathing assist       Upper Body Dressing/Undressing Upper body dressing   What is the patient wearing?: Hospital gown only    Upper body assist Assist Level: Moderate Assistance - Patient 50 - 74%    Lower Body Dressing/Undressing Lower body dressing      What is the patient wearing?: Incontinence brief     Lower body assist Assist for lower body dressing: Dependent - Patient 0%     Toileting Toileting    Toileting assist Assist for toileting: Dependent - Patient 0%     Transfers Chair/bed transfer  Transfers assist     Chair/bed transfer assist level: 2 Helpers     Locomotion Ambulation   Ambulation assist              Walk 10 feet activity   Assist           Walk 50 feet activity   Assist  Walk 150 feet activity   Assist           Walk 10 feet on uneven surface  activity   Assist           Wheelchair     Assist               Wheelchair 50 feet with 2 turns activity    Assist            Wheelchair 150 feet activity     Assist            Medical Problem List and Plan: 1.  Decreased functional mobility secondary to acute respiratory distress with hypoxic respiratory failure secondary to multi focal pneumonia in the setting of MS.    Follow-up chest x-ray 12/07/2019 bibasilar pulmonary infiltrates improved from prior exams.  Patient is completing a 10-day course of  Augmentin on 12/17  Completed 5-day course of IV Solu-Medrol.    Begin CIR evaluations  Team conference today to discuss current and goals and coordination of care, home and environmental barriers, and discharge planning with nursing, case manager, and therapies.  2.  Antithrombotics: DVT/anticoagulation: Subcutaneous Lovenox.  CT angiogram of chest negative for pulmonary emboli             Antiplatelet therapy: N/A 3. Pain Management/spasticity: Baclofen 5 mg 3 times daily 4. Mood: Provide emotional support             -antipsychotic agents: N/A 5. Neuropsych: This patient is not capable of making decisions on his own behalf. 6. Skin/Wound Care: Routine skin checks/pressure injury to buttocks.  Follow-up skin care checks 7. Fluids/Electrolytes/Nutrition: Routine in and outs 8.  Atrial fibrillation with RVR.  IV Cardizem discontinued due to hypotension.  No current plan for anticoagulation  Monitor with increased mobility. 9.  Neurogenic bowel bladder with recurrent UTIs.  Indwelling Foley catheter tube to remain in place at the recommendations of urology services per plan outpatient evaluation for possible need for suprapubic tube 10.  Dysphagia.  Dysphagia #1 nectar liquids.  Follow-up speech therapy.    Advance diet as tolerated. 11.AKI:   BUN remains elevated  Cont IVF 12. Spasticity in setting of MS  Cont Baclofen 13. Steroid induced hyperglycemia  Cont to monitor 14.  Hypernatremia  Sodium 148 on 12/16  Cont to monitor 15. Hypoalbuminemia  Supplement initiated on 12/16 16.  Transaminitis  LFTs elevated on 12/16  Continue to monitor 17. ABLA  Hb 11.9 on 12/16  Cont to monitor  LOS: 1 days A FACE TO FACE EVALUATION WAS PERFORMED  Cailin Gebel Lorie Phenix 12/08/2019, 9:44 AM

## 2019-12-08 NOTE — Evaluation (Signed)
Physical Therapy Assessment and Plan  Patient Details  Name: Stephen Wade MRN: [id number] Date of Birth: 24-Aug-1951  PT Diagnosis: Abnormal posture, Abnormality of gait, Ataxia, Ataxic gait, Difficulty walking, Hypertonia, Impaired cognition, Impaired sensation, Muscle spasms and Muscle weakness Rehab Potential: Good ELOS: 21-25 days   Today's Date: 12/08/2019 PT Individual Time: 1300-1400 PT Individual Time Calculation (min): 60 min    Problem List:  Patient Active Problem List   Diagnosis Date Noted  . Acute blood loss anemia   . Transaminitis   . Hypoalbuminemia due to protein-calorie malnutrition ([place])   . Hypernatremia   . Steroid-induced hyperglycemia   . AKI (acute kidney injury) ([place])   . Dysphagia   . Acute respiratory failure ([place]) 12/07/2019  . Chronic indwelling Foley catheter 12/07/2019  . Acute respiratory failure with hypoxia ([place]) 11/28/2019  . Pneumonia 11/28/2019  . Atrial fibrillation with RVR ([place]) 11/28/2019  . UTI (urinary tract infection) 11/27/2019  . Acute metabolic encephalopathy 28/31/5176  . Urinary retention 06/22/2019  . PCP NOTES >>>>>>> 05/24/2018  . Depression, recurrent ([place]) 05/24/2018  . History of recurrent UTIs 05/22/2018  . Pressure injury of skin 11/06/2017  . Multiple sclerosis ([place]) 11/04/2017  . Skin ulcer of sacral region ([place]) 05/26/2017  . Vitamin D deficiency 01/16/2016  . Spastic gait 02/10/2015  . Spastic diplegia ([place]) 02/10/2015  . Other fatigue 02/10/2015  . Disturbed cognition 02/10/2015  . Edema 05/12/2014  . Hypogonadism male 03/11/2013  . BPH - self caths 12/26/2011  . GERD 11/30/2010  . URINARY CALCULUS 10/05/2010    Past Medical History:  Past Medical History:  Diagnosis Date  . GERD (gastroesophageal reflux disease)    had on- off chest pain, symptoms resolved with Prilosec 11/2010  . Lower urinary tract symptoms (LUTS)    Dr Gaynelle Arabian  . MS (multiple sclerosis) ([place]) 2005  . Testicular hypogonadism    . Urge incontinence of urine   . Urolithiasis 2011   Past Surgical History:  Past Surgical History:  Procedure Laterality Date  . APPENDECTOMY      ruptured 03-2010  . VASECTOMY      Assessment & Plan Clinical Impression: Patient is a  68 year old right-handed male with history of multiple sclerosis followed by [name] maintained on Ocrevus 300 mg every six months.  Recurrent lower urinary tract symptoms/self caths followed by Dr. Gaynelle Arabian, remote tobacco abuse.  Per chart review patient lives with spouse.  Two-level home with bed and bath main level with ramped entrance.  He did have a home health aide for some ADLs however aide was recently COVID-19 positive.  Patient primarily used a power scooter prior to admission he was able to do scoot pivot transfers.  He has been essentially nonambulatory for 8 years.  Presented 11/27/2019 with increased weakness decreased functional mobility.   Cranial CT/MRI no new intracranial abnormality stable since 2018 with changes compatible with chronic demyelination.  Chest x-ray did show infiltrate right base representing pneumonia versus aspiration.  CT angiogram of the chest negative for pulmonary emboli.  Patient was placed on a 5-day course of IV Solu-Medrol for pseudoflare of multiple sclerosis.  Admission chemistries hemoglobin 13.8/hematocrit 42.1, sodium 146, BUN 30, SARS coronavirus negative, urine culture greater 100,000 group B strep ( S.Agalactiae).  EKG showed atrial fibrillation RVR heart rates 120s to 140s he was started on IV Cardizem initially did require nonrebreather mask.  He was transferred to stepdown telemetry 12/01/2019.  His Cardizem was later discontinued due to bouts of hypotension no current  plan for anticoagulation at this time.  Initially maintained on IV Rocephin transition to vancomycin and Zosyn.  MRSA negative and Zosyn was changed to Augmentin completing a 10-day course.  A follow-up chest x-ray was completed 12/07/2019 showed  bibasilar pulmonary infiltrates improved from prior exams.  In regards to patient's recurrent UTIs now with urinary tract infection a Foley catheter tube was placed plan was to follow-up outpatient urology services for consideration of suprapubic catheter.FOLEY TUBE IS not to be removed.  Subcutaneous Lovenox for DVT prophylaxis.  Noted pressure injury blistered area mid bilateral buttocks with generalized wound care.  Palliative care was consulted to establish goals of care.  Patient is currently on dysphagia #1 nectar thick liquid diet and close monitoring of hydration with lattest BUN 34 up from 31-27-21  and received a fluid bolus 12/07/2019.   Patient transferred to CIR on 12/07/2019 .   Patient currently requires max with mobility secondary to muscle weakness and muscle joint tightness, decreased cardiorespiratoy endurance, abnormal tone, unbalanced muscle activation, ataxia and decreased coordination, decreased problem solving, decreased safety awareness and delayed processing and decreased sitting balance, decreased standing balance, decreased postural control and decreased balance strategies.  Prior to hospitalization, patient was supervision with mobility and lived with Spouse in a House home.  Home access is  Ramped entrance.  Patient will benefit from skilled PT intervention to maximize safe functional mobility, minimize fall risk and decrease caregiver burden for planned discharge home with 24 hour assist.  Anticipate patient will benefit from follow up [facility] at discharge.  PT - End of Session Activity Tolerance: Improving;Tolerates < 10 min activity, no significant change in vital signs Endurance Deficit: Yes Endurance Deficit Description: generalized weakness PT Assessment Rehab Potential (ACUTE/IP ONLY): Good PT Barriers to Discharge: Medical stability;IV antibiotics;Neurogenic Bowel & Bladder PT Patient demonstrates impairments in the following area(s):  Sensory;Safety;Perception;Pain;Nutrition;Motor;Endurance;Edema;Balance;Skin Integrity;Behavior PT Transfers Functional Problem(s): Bed Mobility;Bed to Chair;Car;Furniture;Floor PT Locomotion Functional Problem(s): Ambulation;Wheelchair Mobility;Stairs PT Plan PT Intensity: Minimum of 1-2 x/day ,45 to 90 minutes PT Frequency: 5 out of 7 days PT Duration Estimated Length of Stay: 21-25 days PT Transfers Anticipated Outcome(s): Min assist transfers to and from [facility] with lateral scoot PT Locomotion Anticipated Outcome(s): WC level with supevision assist for house hold distances. possible use of power WC/scooter PT Recommendation Recommendations for Other Services: Neuropsych consult;Therapeutic Recreation consult Therapeutic Recreation Interventions: Stress management Follow Up Recommendations: Home health PT Patient destination: Home Equipment Recommended: To be determined  Skilled Therapeutic Intervention  Pt received supine in bed and agreeable to PT. Pt reports need for BM. Rolling R and L to place bed pan with mod-max assist. Pt reports completion, but no void noted in pan. PT and RN required to assist pt with disimpaction while in sidelying. Lower body dressing from bed level with max assist. PT instructed patient in PT Evaluation and initiated treatment intervention; see below for results. PT educated patient in [place], rehab potential, rehab goals, and discharge recommendations.   Supine>sit transfer with max assist and max cues sequencing and safety due to proximity to EOB when completing transfer. Sitting balance EOB with max assist fading to min assist with BUE support on rails of bed. Lateral scoot to the L on EOB with max assist. SB transfer to [facility] on the L with mod-max assist from PT for safety, to improve UE placement, anterior weight shift, and sequencing of movements.   Sit<>stand in parallel bars with max assist and PT providing stability to the RLE. Able to tolerate  up to 30 sec before  requiring seated rest break. Sit<>stand also performed in stedy with max assist. Pt able to come to 75% standing with 1 Assist from low surface. Pt will benefit from +2 sit<>stand from nursing for transfers. Patient returned to room and left sitting in [facility] with call bell in reach and all needs met.       PT Evaluation Precautions/Restrictions Precautions Precautions: Fall Precaution Comments: hx MS (R LE deficits > L LE, spastic diplegia) Restrictions Weight Bearing Restrictions: No General   Vital SignsTherapy Vitals Pulse Rate: (!) 59 Resp: 14 BP: 136/65 Patient Position (if appropriate): Sitting Oxygen Therapy SpO2: 97 % O2 Device: Room Air Pain Pain Assessment Pain Scale: 0-10 Pain Score: 0-No pain Faces Pain Scale: No hurt Home Living/Prior Functioning Home Living Available Help at Discharge: Family Type of Home: House Home Access: Ramped entrance Home Layout: Able to live on main level with bedroom/bathroom Bathroom Shower/Tub: Multimedia programmer: Handicapped height Bathroom Accessibility: Yes Additional Comments: sleeps in lazyboy lift chair  Lives With: Spouse Prior Function Level of Independence: Needs assistance with ADLs;Needs assistance with gait(min-mod A for ADLs)  Able to Take Stairs?: No Driving: No Vocation: Retired Comments: wife assists with transfers Vision/Perception  Vision - Assessment Ocular Range of Motion: Within Functional Limits Alignment/Gaze Preference: Within Defined Limits Tracking/Visual Pursuits: Able to track stimulus in all quads without difficulty Saccades: Within functional limits Diplopia Assessment: Other (comment);Present in primary gaze(only in far gaze) Perception Perception: Impaired [place]: reports its impaired Praxis Praxis: Intact  Cognition Overall Cognitive Status: Impaired/Different from baseline Arousal/Alertness: Awake/alert Orientation Level: Oriented X4 Attention: Sustained Sustained  Attention: Impaired Memory: Impaired Memory Impairment: Decreased short term memory;Decreased recall of new information Decreased Short Term Memory: Verbal basic;Functional basic Immediate Memory Recall: Sock;Blue;Bed Memory Recall Sock: Without Cue Memory Recall Blue: Not able to recall Memory Recall Bed: Not able to recall Awareness: Appears intact Problem Solving: Impaired Problem Solving Impairment: Verbal complex Safety/Judgment: Appears intact Sensation Sensation Light Touch: Impaired Detail Central sensation comments: L distal foot numbness, inconsistent Light Touch Impaired Details: Impaired LLE Coordination Gross Motor Movements are Fluid and Coordinated: No Fine Motor Movements are Fluid and Coordinated: No Coordination and Movement Description: Ataxic, spastic RLE. Generalized weakness Finger Nose Finger Test: Ataxic Motor  Motor Motor: Ataxia;Motor apraxia;Abnormal tone Motor - Skilled Clinical Observations: hx of MS  Mobility Bed Mobility Bed Mobility: Rolling Right;Rolling Left;Sit to Supine;Supine to Sit Rolling Right: Maximal Assistance - Patient 25-49%;Moderate Assistance - Patient 50-74% Rolling Left: Maximal Assistance - Patient 25-49% Supine to Sit: Maximal Assistance - Patient - Patient 25-49% Sit to Supine: Maximal Assistance - Patient 25-49% Transfers Transfers: Lateral/Scoot Transfers Lateral/Scoot Transfers: Maximal Assistance - Patient 25-49% Locomotion  Gait Ambulation: No Gait Gait: No Stairs / Additional Locomotion Stairs: No Wheelchair Mobility Wheelchair Mobility: Yes Wheelchair Assistance: Minimal assistance - Patient >75% Wheelchair Propulsion: Both upper extremities Wheelchair Parts Management: Needs assistance Distance: 100  Trunk/Postural Assessment  Cervical Assessment Cervical Assessment: Exceptions to WFL(forward head) Thoracic Assessment Thoracic Assessment: Exceptions to WFL(rounded shoulders, compensates with kyphotic  posture) Lumbar Assessment Lumbar Assessment: Exceptions to WFL(posterior pelvic tilt) Postural Control Postural Control: Deficits on evaluation Trunk Control: Limited  Balance Balance Balance Assessed: Yes Static Sitting Balance Static Sitting - Balance Support: Feet supported Static Sitting - Level of Assistance: 2: Max assist Dynamic Sitting Balance Dynamic Sitting - Balance Support: Feet supported Dynamic Sitting - Level of Assistance: 2: Max assist Extremity Assessment  RUE Assessment RUE Assessment: Exceptions  to [facility] General Strength Comments: Full AROM, generalized weakness, 3+/5 LUE Assessment LUE Assessment: Exceptions to [facility] General Strength Comments: Full AROM, generalized weakness, 3+/5 RLE Assessment RLE Assessment: Exceptions to [facility] Active Range of Motion (AROM) Comments: limited hip/knee flexion due to extenor tone with exertion General Strength Comments: grossly 3+/5 to 4-/5 proximal to distal LLE Assessment LLE Assessment: Exceptions to [facility] Active Range of Motion (AROM) Comments: grossly WFL General Strength Comments: Grossly 4/5 proximal to distal    Refer to Care Plan for Long Term Goals  Recommendations for other services: Neuropsych and Therapeutic Recreation  Stress management  Discharge Criteria: Patient will be discharged from PT if patient refuses treatment 3 consecutive times without medical reason, if treatment goals not met, if there is a change in medical status, if patient makes no progress towards goals or if patient is discharged from hospital.  The above assessment, treatment plan, treatment alternatives and goals were discussed and mutually agreed upon: by patient and by family  Lorie Phenix 12/08/2019, 1:02 PM

## 2019-12-08 NOTE — Progress Notes (Signed)
Social Work Assessment and Plan   Patient Details  Name: Stephen Wade MRN: [id number] Date of Birth: [birth year]  Today's Date: 12/08/2019  Problem List:  Patient Active Problem List   Diagnosis Date Noted  . Acute blood loss anemia   . Transaminitis   . Hypoalbuminemia due to protein-calorie malnutrition ([place])   . Hypernatremia   . Steroid-induced hyperglycemia   . AKI (acute kidney injury) ([place])   . Dysphagia   . Acute respiratory failure ([place]) 12/07/2019  . Chronic indwelling Foley catheter 12/07/2019  . Acute respiratory failure with hypoxia ([place]) 11/28/2019  . Pneumonia 11/28/2019  . Atrial fibrillation with RVR ([place]) 11/28/2019  . UTI (urinary tract infection) 11/27/2019  . Acute metabolic encephalopathy 10/19/2535  . Urinary retention 06/22/2019  . PCP NOTES >>>>>>> 05/24/2018  . Depression, recurrent ([place]) 05/24/2018  . History of recurrent UTIs 05/22/2018  . Pressure injury of skin 11/06/2017  . Multiple sclerosis ([place]) 11/04/2017  . Skin ulcer of sacral region ([place]) 05/26/2017  . Vitamin D deficiency 01/16/2016  . Spastic gait 02/10/2015  . Spastic diplegia ([place]) 02/10/2015  . Other fatigue 02/10/2015  . Disturbed cognition 02/10/2015  . Edema 05/12/2014  . Hypogonadism male 03/11/2013  . BPH - self caths 12/26/2011  . GERD 11/30/2010  . URINARY CALCULUS 10/05/2010   Past Medical History:  Past Medical History:  Diagnosis Date  . GERD (gastroesophageal reflux disease)    had on- off chest pain, symptoms resolved with Prilosec 11/2010  . Lower urinary tract symptoms (LUTS)    Dr Gaynelle Arabian  . MS (multiple sclerosis) ([place]) 2005  . Testicular hypogonadism   . Urge incontinence of urine   . Urolithiasis 2011   Past Surgical History:  Past Surgical History:  Procedure Laterality Date  . APPENDECTOMY      ruptured 03-2010  . VASECTOMY     Social History:  reports that he has quit smoking. He has never used smokeless tobacco. He reports that he does not  drink alcohol or use drugs.  Family / Support Systems Marital Status: Married Patient Roles: Spouse, Parent, Other (Comment)(Owner of business) Spouse/Significant Other: Magda Paganini [phone]-cell Children: Three children Other Supports: Friends and church members-hired aide Anticipated Caregiver: Wife and hired aide Ability/Limitations of Caregiver: Min-mod wife is small in [place] and can hire assist, had prior to admission Caregiver Availability: 24/7 Family Dynamics: Close knit family all pull together in times of crisis. Wife is his main caregiver and directs the hired assist for what husband needs. Pt would like to get back to the level he was at prior to admission if he can.  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 Wife is here daily and will be involved also. She is his HCPOA and POA   Abuse/Neglect Abuse/Neglect Assessment Can Be Completed: Yes Physical Abuse: Denies Verbal Abuse: Denies Sexual Abuse: Denies Exploitation of patient/patient's resources: Denies Self-Neglect: Denies  Emotional Status Pt's affect, behavior and adjustment status: Pt is able to explain his exacerbation and issues. He hopes to regain the level he was at prior to admission-could self cath and was on a regular diet. His wife will be here  daily and assist. Pt was WC bound prior to admission but could assit with transfers and move better. Recent Psychosocial Issues: other health issues-MS and slow decline, depression since 2019 due to loss of function Psychiatric History: History  of depression takes medications which he feels are helpful but would also benefit from seeing neruo-psych while here for coping. Will make referral while here Substance Abuse History: No issues  Patient / Family Perceptions, Expectations & Goals Pt/Family understanding of illness & functional limitations: Pt and wife can explain his MS and issues going on now. Both speak with the MD"s and feel they have a good understanding of his treatment plan going forward. Wife will be here daily to provide emotional support and see his progress. Premorbid pt/family roles/activities: Husband, father, owner, friend, church member, etc Anticipated changes in roles/activities/participation: resume Pt/family expectations/goals: Pt states: " I want to get better and back to where I was if I can."  Wife states: " I hope he can get back to his prior functional level, we will see."  [employer]: Other (Comment)(had hired assist-CNA) Premorbid Home Care/DME Agencies: Other (Comment)(has all equipment and has had HH in the past) Transportation available at discharge: Wife Resource referrals recommended: Neuropsychology  Discharge Planning Living Arrangements: Spouse/significant other Support Systems: Spouse/significant other, Children, Manufacturing engineer, Psychologist, clinical community Type of Residence: Private residence Insurance Resources: [employer], Media planner (specify)(AARP) Financial Resources: Employment Financial Screen Referred: No Living Expenses: Own Money Management: Patient, Spouse Does the patient have any problems obtaining your medications?: No Home Management: Pt and wife Patient/Family Preliminary Plans: Return home with wife and hired assist. Wife is looking for another aide due to one they had tested positive for COVID and they will not have back until he is negative. Will await therapy evaluation and work on discharge needs. Social Work Anticipated Follow Up Needs:  HH/OP  Clinical Impression Pleasant motivated gentleman who is willing to work hard to regain his function he has lost due to this hospitalization. His wife is involved and assists with his care along with a hired aide. Will await therapy evaluations and make referral for neuro-psych to see while here for coping.  Lucy Chris 12/08/2019, 3:52 PM

## 2019-12-08 NOTE — Care Management Note (Signed)
Inpatient [facility] Individual Statement of Services  Patient Name:  Stephen Wade  Date:  12/08/2019  Welcome to [facility].  Our goal is to provide you with an individualized program based on your diagnosis and situation, designed to meet your specific needs.  With this comprehensive rehabilitation program, you will be expected to participate in at least 3 hours of rehabilitation therapies Monday-Friday, with modified therapy programming on the weekends.  Your rehabilitation program will include the following services:  Physical Therapy (PT), Occupational Therapy (OT), Speech Therapy (ST), 24 hour per day rehabilitation nursing, Neuropsychology, Case Management (Social Worker), Rehabilitation Medicine, Nutrition Services and Pharmacy Services  Weekly team conferences will be held on Wednesday to discuss your progress.  Your Social Worker will talk with you frequently to get your input and to update you on team discussions.  Team conferences with you and your family in attendance may also be held.  Expected length of stay: 21-25 days  Overall anticipated outcome: min assist wheelchair level  Depending on your progress and recovery, your program may change. Your Social Worker will coordinate services and will keep you informed of any changes. Your Social Worker's name and contact numbers are listed  below.  The following services may also be recommended but are not provided by the [facility]:    [place] will be made to provide these services after discharge if needed.  Arrangements include referral to agencies that provide these services.  Your insurance has been verified to be:  Medicare & Airport Your primary doctor is:  Kathlene November  Pertinent information will be shared with your doctor and your insurance company.  Social Worker:  Ovidio Kin, [place] or ([phone]  Information discussed with and copy given to patient by: Elease Hashimoto, 12/08/2019, 3:31 PM

## 2019-12-08 NOTE — Patient Care Conference (Signed)
Inpatient RehabilitationTeam Conference and Plan of Care Update Date: 12/08/2019   Time: 11:30 AM   Patient Name: Stephen Wade      Medical Record Number: [id number]  Date of Birth: [birth year] Sex: Male         Room/Bed: 4W01C/4W01C-01 Payor Info: Payor: MEDICARE / Plan: MEDICARE PART A AND B / Product Type: *No Product type* /    Admit Date/Time:  12/07/2019  3:31 PM  Primary Diagnosis:  Debility  Patient Active Problem List   Diagnosis Date Noted  . Debility 12/08/2019  . Acute blood loss anemia   . Transaminitis   . Hypoalbuminemia due to protein-calorie malnutrition ([place])   . Hypernatremia   . Steroid-induced hyperglycemia   . AKI (acute kidney injury) ([place])   . Dysphagia   . Acute respiratory failure ([place]) 12/07/2019  . Chronic indwelling Foley catheter 12/07/2019  . Acute respiratory failure with hypoxia ([place]) 11/28/2019  . Pneumonia 11/28/2019  . Atrial fibrillation with RVR ([place]) 11/28/2019  . UTI (urinary tract infection) 11/27/2019  . Acute metabolic encephalopathy 35/57/3220  . Urinary retention 06/22/2019  . PCP NOTES >>>>>>> 05/24/2018  . Depression, recurrent ([place]) 05/24/2018  . History of recurrent UTIs 05/22/2018  . Pressure injury of skin 11/06/2017  . Multiple sclerosis ([place]) 11/04/2017  . Skin ulcer of sacral region ([place]) 05/26/2017  . Vitamin D deficiency 01/16/2016  . Spastic gait 02/10/2015  . Spastic diplegia ([place]) 02/10/2015  . Other fatigue 02/10/2015  . Disturbed cognition 02/10/2015  . Edema 05/12/2014  . Hypogonadism male 03/11/2013  . BPH - self caths 12/26/2011  . GERD 11/30/2010  . URINARY CALCULUS 10/05/2010    Expected Discharge Date: Expected Discharge Date: (ELOS 3-4 weeks)  Team Members Present: Physician leading conference: Dr. Delice Lesch Social Worker Present: Ovidio Kin, LCSW Nurse Present: Suella [place], RN Case manager:Genie [place], RN  PT Present: Becky Sax, PT OT Present: Laverle Hobby, OT SLP Present: Jettie Booze, CF-SLP PPS Coordinator present : Gunnar Fusi, SLP     Current Status/Progress Goal Weekly Team Focus  Bowel/Bladder   incontinent of bowel, foley catheter, LBM 12/08/19  less incontinent episodes  continue to monitor for changes   Swallow/Nutrition/ Hydration   eval pending         ADL's   max +2 ADLs and mobility  Min A overall  Functional activity tolerance, sitting balance, ADL transfers, cognitive retraining   Mobility   eval pending  eval pending      Communication   eval pending         Safety/Cognition/ Behavioral Observations  eval pending         Pain   no c/o pain, has tylenol prn  pain scale <4/10  assess & treat as needed   Skin   MASD to buttocks  no new areas of skin break down  asses q shift      *See Care Plan and progress notes for long and short-term goals.     Barriers to Discharge  Current Status/Progress Possible Resolutions Date Resolved   Nursing  Neurogenic Bowel & Bladder;Incontinence  Patient will beable to self I&O cath by discharge            PT  Medical stability;IV antibiotics;Neurogenic Bowel & Bladder                 OT Neurogenic Bowel & Bladder                SLP  SW                Discharge Planning/Teaching Needs:  Home with wife and hired aide, she plans on hiring to assist her with pt's care. New evaluation and goals being set today      Team Discussion: Monitoring labs, AKI improving, IV fluids, advancing diet, neurogenic bladder.  RN has foley, ?needs bowel program, red bottom, no pain, on abx for UTI, ?possible change foley this pm.  OT does dig stim at home, needs bowel program, needs neuropsych, max A +2 sitting balance.  SLP D1nectar, MBS 12/8, chronic dysphagia, on water protocol.  PT eval pending.   Revisions to Treatment Plan: N/A     Medical Summary Current Status: Decreased functional mobility secondary to acute respiratory distress with hypoxic respiratory failure secondary to multi focal  pneumonia in the setting of MS Weekly Focus/Goal: Improve endurance, swallowing, hypernatremia, spasticity  Barriers to Discharge: Medical stability;Neurogenic Bowel & Bladder;Nutrition means;Wound care   Possible Resolutions to Barriers: Therapies, advance diet as tolerated, follow labs, optimize antispasticity meds   Continued Need for Acute Rehabilitation Level of Care: The patient requires daily medical management by a physician with specialized training in physical medicine and rehabilitation for the following reasons: Direction of a multidisciplinary physical rehabilitation program to maximize functional independence : Yes Medical management of patient stability for increased activity during participation in an intensive rehabilitation regime.: Yes Analysis of laboratory values and/or radiology reports with any subsequent need for medication adjustment and/or medical intervention. : Yes   I attest that I was present, lead the team conference, and concur with the assessment and plan of the team.   Trish Mage 12/09/2019, 9:32 AM  Team conference was held via web/ teleconference due to COVID - 19

## 2019-12-08 NOTE — Progress Notes (Signed)
Inpatient Rehabilitation  Patient information reviewed and entered into eRehab system by Keveon Amsler M. Krrish Freund, M.A., CCC/SLP, PPS Coordinator.  Information including medical coding, functional ability and quality indicators will be reviewed and updated through discharge.    

## 2019-12-08 NOTE — IPOC Note (Signed)
Individualized overall Plan of Care (IPOC) Patient Details Name: Stephen Wade MRN: [id number] DOB: 17-Sep-1951  Admitting Diagnosis: [facility] Problems: Principal Problem:   Debility Active Problems:   Skin ulcer of sacral region ([place])   Multiple sclerosis ([place])   Acute metabolic encephalopathy   Acute respiratory failure ([place])   Chronic indwelling Foley catheter   Acute blood loss anemia   Transaminitis   Hypoalbuminemia due to protein-calorie malnutrition ([facility])   Hypernatremia   Steroid-induced hyperglycemia   AKI (acute kidney injury) ([place])   Dysphagia      Functional Problem List: Nursing Medication Management, Motor, Nutrition, Safety, Bladder, Bowel, Skin Integrity, Sensory, Endurance  PT Sensory, Safety, Perception, Pain, Nutrition, Motor, Endurance, Edema, Balance, Skin Integrity, Behavior  OT Balance, Motor, Sensory, Skin Integrity, Cognition, Pain, Vision, Perception, Edema, Endurance, Safety  SLP Cognition, Nutrition, Linguistic  TR         Basic ADL's: OT Eating, Grooming, Bathing, Dressing, Toileting     Advanced  ADL's: OT       Transfers: PT Bed Mobility, Bed to Chair, Car, [employer], Futures trader, Metallurgist: PT Ambulation, Emergency planning/management officer, Stairs     Additional Impairments: OT None  SLP Swallowing, Communication, Social Cognition expression Problem Solving, Memory  TR      Anticipated Outcomes Item Anticipated Outcome  Self Feeding set up assist  Swallowing  Supervision A   Basic self-care  min A  Toileting  min A   Bathroom Transfers min A  Bowel/Bladder  Patient will be able to continent bowel and self I&O cath with mod assist  Transfers  Min assist transfers to and from [facility] with lateral scoot  Locomotion  WC level with supevision assist for house hold distances. possible use of power Multimedia programmer A  Cognition  Min A  Pain  <2  Safety/Judgment  Patient will be able  to call for help with Mod I by the completion of rehab   Therapy Plan: PT Intensity: Minimum of 1-2 x/day ,45 to 90 minutes PT Frequency: 5 out of 7 days PT Duration Estimated Length of Stay: 21-25 days OT Intensity: Minimum of 1-2 x/day, 45 to 90 minutes OT Frequency: 5 out of 7 days OT Duration/Estimated Length of Stay: 3-4 weeks SLP Intensity: Minumum of 1-2 x/day, 30 to 90 minutes SLP Frequency: 3 to 5 out of 7 days SLP Duration/Estimated Length of Stay: 3-4 weeks    Team Interventions: Nursing Interventions Patient/Family Education, Bladder Management, Bowel Management, Skin Care/Wound Management, Dysphagia/Aspiration Precaution Training, Medication Management, Discharge Planning, Psychosocial Support  PT interventions    OT Interventions Balance/vestibular training, Cognitive remediation/compensation, Community reintegration, Disease mangement/prevention, Barrister's clerk education, Self Care/advanced ADL retraining, Splinting/orthotics, Therapeutic Exercise, Wheelchair propulsion/positioning, UE/LE Coordination activities, Visual/perceptual remediation/compensation, UE/LE Strength taining/ROM, Skin care/wound managment, Psychosocial support, Pain management, Functional mobility training, DME/adaptive equipment instruction, Discharge planning, Therapeutic Activities  SLP Interventions Cognitive remediation/compensation, Cueing hierarchy, Dysphagia/aspiration precaution training, Functional tasks, Patient/family education, Therapeutic Activities, Speech/Language facilitation, Internal/external aids  TR Interventions    SW/CM Interventions Discharge Planning, Psychosocial Support, Patient/Family Education   Barriers to Discharge MD  Medical stability, Neurogenic bowel and bladder and Wound care  Nursing Neurogenic Bowel & Bladder, Incontinence Patient will beable to self I&O cath by discharge  PT Medical stability, IV antibiotics, Neurogenic Bowel & Bladder    OT Neurogenic Bowel &  Bladder    SLP      SW  Team Discharge Planning: Destination: PT-Home ,OT- Home , SLP-Home Projected Follow-up: PT-Home health PT, OT-  Home health OT, SLP-24 hour supervision/assistance, Home Health SLP Projected Equipment Needs: PT-To be determined, OT- To be determined, SLP-None recommended by SLP Equipment Details: PT- , OT-  Patient/family involved in discharge planning: PT- Patient, Family member/caregiver,  OT-Patient, Family member/caregiver, SLP-Patient, Family member/caregiver  MD ELOS: 10-14 days. Medical Rehab Prognosis:  Good Assessment: 67 year old right-handed male with history of multiple sclerosis followed by [name] maintained on Ocrevus 300 mg every six months.  Recurrent lower urinary tract symptoms/self caths followed by [name], remote tobacco abuse.  Presented 11/27/2019 with increased weakness decreased functional mobility.   Cranial CT/MRI no new intracranial abnormality stable since 2018 with changes compatible with chronic demyelination.  Chest x-ray did show infiltrate right base representing pneumonia versus aspiration.  CT angiogram of the chest negative for pulmonary emboli.  Patient was placed on a 5-day course of IV Solu-Medrol for pseudoflare of multiple sclerosis.  Admission chemistries hemoglobin 13.8/hematocrit 42.1, sodium 146, BUN 30, SARS coronavirus negative, urine culture greater 100,000 group B strep ( S.Agalactiae).  EKG showed atrial fibrillation RVR heart rates 120s to 140s he was started on IV Cardizem initially did require nonrebreather mask.  He was transferred to stepdown telemetry 12/01/2019.  His Cardizem was later discontinued due to bouts of hypotension no current plan for anticoagulation at this time.  Initially maintained on IV Rocephin transition to vancomycin and Zosyn.  MRSA negative and Zosyn was changed to Augmentin completing a 10-day course.  A follow-up chest x-ray was completed 12/07/2019 showed bibasilar pulmonary  infiltrates improved from prior exams.  In regards to patient's recurrent UTIs now with urinary tract infection a Foley catheter tube was placed plan was to follow-up outpatient urology services for consideration of suprapubic catheter. FOLEY TUBE IS not to be removed.  Noted pressure injury blistered area mid bilateral buttocks with generalized wound care.  Palliative care was consulted to establish goals of care.  Patient is currently on dysphagia #1 nectar thick liquid diet and close monitoring of hydration and received a fluid bolus 12/07/2019.  Patient with resulting functional deficits with transfers, endurance, self-care, swallowing.  Will set goals for Min A at wheelchair level with PT/OT and Min A with SLP.   Due to the current state of emergency, patients may not be receiving their 3-hours of Medicare-mandated therapy.  See Team Conference Notes for weekly updates to the plan of care

## 2019-12-08 NOTE — Evaluation (Signed)
Speech Language Pathology Assessment and Plan  Patient Details  Name: Stephen Wade MRN: [id number] Date of Birth: 1951/05/04  SLP Diagnosis: Dysarthria;Cognitive Impairments;Dysphagia  Rehab Potential: Good ELOS: 3-4 weeks    Today's Date: 12/08/2019 SLP Individual Time: 3500-9381 SLP Individual Time Calculation (min): 62 min   Problem List:  Patient Active Problem List   Diagnosis Date Noted  . Acute blood loss anemia   . Transaminitis   . Hypoalbuminemia due to protein-calorie malnutrition ([place])   . Hypernatremia   . Steroid-induced hyperglycemia   . AKI (acute kidney injury) ([place])   . Dysphagia   . Acute respiratory failure ([place]) 12/07/2019  . Chronic indwelling Foley catheter 12/07/2019  . Acute respiratory failure with hypoxia ([place]) 11/28/2019  . Pneumonia 11/28/2019  . Atrial fibrillation with RVR ([place]) 11/28/2019  . UTI (urinary tract infection) 11/27/2019  . Acute metabolic encephalopathy 82/99/3716  . Urinary retention 06/22/2019  . PCP NOTES >>>>>>> 05/24/2018  . Depression, recurrent ([place]) 05/24/2018  . History of recurrent UTIs 05/22/2018  . Pressure injury of skin 11/06/2017  . Multiple sclerosis ([place]) 11/04/2017  . Skin ulcer of sacral region ([place]) 05/26/2017  . Vitamin D deficiency 01/16/2016  . Spastic gait 02/10/2015  . Spastic diplegia ([place]) 02/10/2015  . Other fatigue 02/10/2015  . Disturbed cognition 02/10/2015  . Edema 05/12/2014  . Hypogonadism male 03/11/2013  . BPH - self caths 12/26/2011  . GERD 11/30/2010  . URINARY CALCULUS 10/05/2010   Past Medical History:  Past Medical History:  Diagnosis Date  . GERD (gastroesophageal reflux disease)    had on- off chest pain, symptoms resolved with Prilosec 11/2010  . Lower urinary tract symptoms (LUTS)    Dr Gaynelle Arabian  . MS (multiple sclerosis) ([place]) 2005  . Testicular hypogonadism   . Urge incontinence of urine   . Urolithiasis 2011   Past Surgical History:  Past Surgical History:   Procedure Laterality Date  . APPENDECTOMY      ruptured 03-2010  . VASECTOMY      Assessment / Plan / Recommendation Clinical Impression   HPI: Andres Vest. Tawil is a 68 year old right-handed male with history of multiple sclerosis followed by [name] maintained on Ocrevus 300 mg every six months.  Recurrent lower urinary tract symptoms/self caths followed by Dr. Gaynelle Arabian, remote tobacco abuse.  Per chart review patient lives with spouse.  Two-level home with bed and bath main level with ramped entrance.  He did have a home health aide for some ADLs however aide was recently COVID-19 positive.  Patient primarily used a power scooter prior to admission he was able to do scoot pivot transfers.  He has been essentially nonambulatory for 8 years.  Presented 11/27/2019 with increased weakness decreased functional mobility.   Cranial CT/MRI no new intracranial abnormality stable since 2018 with changes compatible with chronic demyelination.  Chest x-ray did show infiltrate right base representing pneumonia versus aspiration.  CT angiogram of the chest negative for pulmonary emboli.  Patient was placed on a 5-day course of IV Solu-Medrol for pseudoflare of multiple sclerosis.  Admission chemistries hemoglobin 13.8/hematocrit 42.1, sodium 146, BUN 30, SARS coronavirus negative, urine culture greater 100,000 group B strep ( S.Agalactiae).  EKG showed atrial fibrillation RVR heart rates 120s to 140s he was started on IV Cardizem initially did require nonrebreather mask.  He was transferred to stepdown telemetry 12/01/2019.  His Cardizem was later discontinued due to bouts of hypotension no current plan for anticoagulation at this time.  Initially maintained on IV  Rocephin transition to vancomycin and Zosyn.  MRSA negative and Zosyn was changed to Augmentin completing a 10-day course.  A follow-up chest x-ray was completed 12/07/2019 showed bibasilar pulmonary infiltrates improved from prior exams.  In regards to  patient's recurrent UTIs now with urinary tract infection a Foley catheter tube was placed plan was to follow-up outpatient urology services for consideration of suprapubic catheter.FOLEY TUBE IS not to be removed.  Subcutaneous Lovenox for DVT prophylaxis.  Noted pressure injury blistered area mid bilateral buttocks with generalized wound care.  Palliative care was consulted to establish goals of care.  Patient is currently on dysphagia #1 nectar thick liquid diet and close monitoring of hydration with lattest BUN 34 up from 31-27-21  and received a fluid bolus 12/07/2019.  Pt was admitted to CIR at [facility] 12/07/19 and SLP evaluations were completed 12/08/19 with results as follows:  Clinical bedside swallow evaluation: Pt presents with oropharyngeal dysphagia characterized by sensed aspiration of thin liquids (per MBSS 11/30/19) as well as delayed oral transit of impaired mastication of solids. Following thorough oral care pt accepted teaspoons of thin H2O X4 with wet vocal quality throughout and immediate throat clear X1. No overt s/sx aspiration were observed throughout trials of nectar thick liquids. Of note, he also presented with intermittent wet vocal quality throughout evaluation and required verbal cues to clear through/cough/use suction to manage secretions. Pt's volitional cough is also weak - he would benefit from RMST and pharyngeal strengthening exercises. Pt's deglutition of Dys 1 (puree) trials were unremarkable; Dys 2 (minced) solids resulted in delayed oral transit and min oral residue. Recommend continue current diet - Dys 1 (puree), nectar thick liquids, meds crushed, full supervision to cue for swallow strategies (slow rate, small bites/sips, intermittent dry swallows). Pt may also be on water protocol, however sips should only be administered after thorough oral care with suction, at least 30 mins after last PO intake, given via teaspoon, and pt must perform chin tuck. Please also have suction  on and within pt's reach to assist with secretion management.   Cognitive-linguistic evaluation: Pt presents with moderate dysarthria characterized by imprecise consonants, most noticeable with multi-syllabic words and words containing consonant clusters. Pt and wife note acute change in speech since this admission, and pt expressed feeling speech is very "laborious right now." He is currently ~75% intelligible at the sentence level. Although pt reported no acute changes in cognition, he demonstrated poor recall of functional new information including information about his swallow strategies/diet recommendations/events of the day. Pt's wife also confirmed perceived significant short term memory deficits. Mild problem solving impairments also noted throughout functional activities. ST will continue to assess cognitive function through diagnostic treatment.  Recommend pt receive skilled ST to address aforementioned dysarthria, dysphagia, and cognitive impairments while inpatient in order to maximize functional independence, communication, diety safety and efficiency, and reduce burden of care at discharge.   Skilled Therapeutic Interventions          Cognitive-linguistic evaluation and clinical bedside swallow evaluations were administered (see above for details) and results were reviewed with pt and wife.    SLP Assessment  Patient will need skilled Speech Lanaguage Pathology Services during CIR admission    Recommendations  SLP Diet Recommendations: Dysphagia 1 (Puree);Nectar;Free water protocol after oral care(with water protocol - must be given via teaspoon and use chin tuck strategy when swallowing) Liquid Administration via: Cup;Spoon Medication Administration: Crushed with puree Supervision: Staff to assist with self feeding;Full supervision/cueing for compensatory strategies Compensations: Slow  rate;Small sips/bites;Other (Comment)(intermittent dry swallow) Postural Changes and/or Swallow  Maneuvers: Seated upright 90 degrees;Upright 30-60 min after meal Oral Care Recommendations: Oral care before and after PO;Oral care prior to ice chip/H20 Recommendations for Other Services: Neuropsych consult Patient destination: Home Follow up Recommendations: 24 hour supervision/assistance;Home Health SLP Equipment Recommended: None recommended by SLP    SLP Frequency 3 to 5 out of 7 days   SLP Duration  SLP Intensity  SLP Treatment/Interventions 3-4 weeks  Minumum of 1-2 x/day, 30 to 90 minutes  Cognitive remediation/compensation;Cueing hierarchy;Dysphagia/aspiration precaution training;Functional tasks;Patient/family education;Therapeutic Activities;Speech/Language facilitation;Internal/external aids    Pain Pain Assessment Pain Scale: Faces Pain Score: 0-No pain Faces Pain Scale: No hurt      SLP Evaluation Cognition Overall Cognitive Status: Impaired/Different from baseline Arousal/Alertness: Awake/alert Orientation Level: Oriented X4 Memory: Impaired Memory Impairment: Decreased short term memory;Decreased recall of new information Decreased Short Term Memory: Verbal basic;Functional basic Awareness: Appears intact Problem Solving: Impaired Problem Solving Impairment: Verbal complex Safety/Judgment: Appears intact  Comprehension Auditory Comprehension Overall Auditory Comprehension: Appears within functional limits for tasks assessed Yes/No Questions: Within Functional Limits Commands: Within Functional Limits Conversation: Simple Visual Recognition/Discrimination Discrimination: Not tested Reading Comprehension Reading Status: Not tested Expression Expression Primary Mode of Expression: Verbal Verbal Expression Overall Verbal Expression: Appears within functional limits for tasks assessed Initiation: No impairment Level of Generative/Spontaneous Verbalization: Sentence Repetition: No impairment Naming: Not tested Pragmatics: No impairment Non-Verbal  Means of Communication: Not applicable Written Expression Written Expression: Not tested Oral Motor Oral Motor/Sensory Function Overall Oral Motor/Sensory Function: Generalized oral weakness Facial ROM: Within Functional Limits Facial Symmetry: Within Functional Limits Facial Strength: Reduced right;Reduced left Lingual ROM: Within Functional Limits Lingual Symmetry: Within Functional Limits Lingual Strength: Reduced Velum: Within Functional Limits Mandible: Within Functional Limits Motor Speech Overall Motor Speech: Impaired Respiration: Within functional limits Phonation: Hoarse Resonance: Within functional limits Articulation: Impaired Level of Impairment: Phrase Intelligibility: Intelligibility reduced Word: 75-100% accurate Phrase: 75-100% accurate Sentence: 75-100% accurate Conversation: 50-74% accurate Motor Planning: Witnin functional limits Motor Speech Errors: Not applicable Interfering Components: Premorbid status Effective Techniques: Slow rate;Over-articulate;Increased vocal intensity;Pacing   Intelligibility: Intelligibility reduced Word: 75-100% accurate Phrase: 75-100% accurate Sentence: 75-100% accurate Conversation: 50-74% accurate  Bedside Swallowing Assessment General Date of Onset: 11/29/19 Previous Swallow Assessment: MBSS 11/30/19 Diet Prior to this Study: Dysphagia 1 (puree);Nectar-thick liquids Temperature Spikes Noted: N/A Respiratory Status: Room air History of Recent Intubation: No Behavior/Cognition: Alert;Cooperative;Pleasant mood Oral Cavity - Dentition: Adequate natural dentition Self-Feeding Abilities: Needs assist;Needs set up Patient Positioning: Upright in bed Baseline Vocal Quality: Low vocal intensity;Wet;Hoarse Volitional Cough: Weak Volitional Swallow: Able to elicit  Oral Care Assessment Does patient have any of the following "high(er) risk" factors?: None of the above Does patient have any of the following "at risk"  factors?: Other - dysphagia;Diet - patient on thickened liquids Patient is AT RISK: Order set for Adult Oral Care Protocol initiated -  "At Risk Patients" option selected (see row information) Ice Chips Ice chips: Not tested Thin Liquid Thin Liquid: Impaired Presentation: Spoon Pharyngeal  Phase Impairments: Wet Vocal Quality Nectar Thick Nectar Thick Liquid: Within functional limits Presentation: Cup Honey Thick Honey Thick Liquid: Not tested Puree Puree: Within functional limits Presentation: Spoon Solid Solid: Impaired Presentation: Spoon Oral Phase Functional Implications: Prolonged oral transit;Oral residue BSE Assessment Risk for Aspiration Impact on safety and function: Risk for inadequate nutrition/hydration;Moderate aspiration risk Other Related Risk Factors: History of dysphagia;Decreased management of secretions  Short Term Goals: Week 1: SLP Short Term  Goal 1 (Week 1): Pt will consume Dys 1/nectar diet with Min overt s/sx aspiration and efficient mastication and oral clearance with Min A verbal cues for use of swallow strtaegies. SLP Short Term Goal 2 (Week 1): Pt will consume trials of thin H2O via teaspoon with Min A verbal cues for use of swallow strategies and Min overt s/sx aspiration. SLP Short Term Goal 3 (Week 1): Pt will demonstrate recall of new/day to day information with Mod A verbal/visual cues for use of compensatory strategies and/or external aids. SLP Short Term Goal 4 (Week 1): Pt will demonstrate ability to problem solve during functional mildly complex tasks with Mod A verbal/visual cues. SLP Short Term Goal 5 (Week 1): Pt will use speech intelligibility strategie with Min A verbal cues to achieve 80% intelligibility at the sentence level.  Refer to Care Plan for Long Term Goals  Recommendations for other services: Neuropsych  Discharge Criteria: Patient will be discharged from SLP if patient refuses treatment 3 consecutive times without medical  reason, if treatment goals not met, if there is a change in medical status, if patient makes no progress towards goals or if patient is discharged from hospital.  The above assessment, treatment plan, treatment alternatives and goals were discussed and mutually agreed upon: by patient  Arbutus Leas 12/08/2019, 9:59 AM

## 2019-12-08 NOTE — Evaluation (Signed)
Occupational Therapy Assessment and Plan  Patient Details  Name: Stephen Wade MRN: [id number] Date of Birth: 1951/12/01  OT Diagnosis: abnormal posture, ataxia, cognitive deficits and muscle weakness (generalized) Rehab Potential: Rehab Potential (ACUTE ONLY): Fair ELOS: 3-4 weeks   Today's Date: 12/08/2019 OT Individual Time: 1015-1130 OT Individual Time Calculation (min): 75 min     Problem List:  Patient Active Problem List   Diagnosis Date Noted  . Acute blood loss anemia   . Transaminitis   . Hypoalbuminemia due to protein-calorie malnutrition ([place])   . Hypernatremia   . Steroid-induced hyperglycemia   . AKI (acute kidney injury) ([place])   . Dysphagia   . Acute respiratory failure ([place]) 12/07/2019  . Chronic indwelling Foley catheter 12/07/2019  . Acute respiratory failure with hypoxia ([place]) 11/28/2019  . Pneumonia 11/28/2019  . Atrial fibrillation with RVR ([place]) 11/28/2019  . UTI (urinary tract infection) 11/27/2019  . Acute metabolic encephalopathy 42/59/5638  . Urinary retention 06/22/2019  . PCP NOTES >>>>>>> 05/24/2018  . Depression, recurrent ([place]) 05/24/2018  . History of recurrent UTIs 05/22/2018  . Pressure injury of skin 11/06/2017  . Multiple sclerosis ([place]) 11/04/2017  . Skin ulcer of sacral region ([place]) 05/26/2017  . Vitamin D deficiency 01/16/2016  . Spastic gait 02/10/2015  . Spastic diplegia ([place]) 02/10/2015  . Other fatigue 02/10/2015  . Disturbed cognition 02/10/2015  . Edema 05/12/2014  . Hypogonadism male 03/11/2013  . BPH - self caths 12/26/2011  . GERD 11/30/2010  . URINARY CALCULUS 10/05/2010    Past Medical History:  Past Medical History:  Diagnosis Date  . GERD (gastroesophageal reflux disease)    had on- off chest pain, symptoms resolved with Prilosec 11/2010  . Lower urinary tract symptoms (LUTS)    Dr Gaynelle Arabian  . MS (multiple sclerosis) ([place]) 2005  . Testicular hypogonadism   . Urge incontinence of urine   . Urolithiasis  2011   Past Surgical History:  Past Surgical History:  Procedure Laterality Date  . APPENDECTOMY      ruptured 03-2010  . VASECTOMY      Assessment & Plan Clinical Impression: Stephen Wade is a 68 year old right-handed male with history of multiple sclerosis followed by [name] maintained on Ocrevus 300 mg every six months.  Recurrent lower urinary tract symptoms/self caths followed by Dr. Gaynelle Arabian, remote tobacco abuse.  Per chart review patient lives with spouse.  Two-level home with bed and bath main level with ramped entrance.  He did have a home health aide for some ADLs however aide was recently COVID-19 positive.  Patient primarily used a power scooter prior to admission he was able to do scoot pivot transfers.  He has been essentially nonambulatory for 8 years.  Presented 11/27/2019 with increased weakness decreased functional mobility.   Cranial CT/MRI no new intracranial abnormality stable since 2018 with changes compatible with chronic demyelination.  Chest x-ray did show infiltrate right base representing pneumonia versus aspiration.  CT angiogram of the chest negative for pulmonary emboli.  Patient was placed on a 5-day course of IV Solu-Medrol for pseudoflare of multiple sclerosis.  Admission chemistries hemoglobin 13.8/hematocrit 42.1, sodium 146, BUN 30, SARS coronavirus negative, urine culture greater 100,000 group B strep ( S.Agalactiae).  EKG showed atrial fibrillation RVR heart rates 120s to 140s he was started on IV Cardizem initially did require nonrebreather mask.  He was transferred to stepdown telemetry 12/01/2019.  His Cardizem was later discontinued due to bouts of hypotension no current plan for anticoagulation at this  time.  Initially maintained on IV Rocephin transition to vancomycin and Zosyn.  MRSA negative and Zosyn was changed to Augmentin completing a 10-day course.  A follow-up chest x-ray was completed 12/07/2019 showed bibasilar pulmonary infiltrates improved from  prior exams.  In regards to patient's recurrent UTIs now with urinary tract infection a Foley catheter tube was placed plan was to follow-up outpatient urology services for consideration of suprapubic catheter.FOLEY TUBE IS not to be removed.  Subcutaneous Lovenox for DVT prophylaxis.  Noted pressure injury blistered area mid bilateral buttocks with generalized wound care.  Palliative care was consulted to establish goals of care.  Patient is currently on dysphagia #1 nectar thick liquid diet and close monitoring of hydration with lattest BUN 34 up from 31-27-21  and received a fluid bolus 12/07/2019.  Therapy evaluations completed and patient was admitted for a comprehensive rehab program. Patient transferred to CIR on 12/07/2019 .    Patient currently requires max with basic self-care skills secondary to muscle weakness, decreased cardiorespiratoy endurance, decreased visual motor skills, decreased safety awareness and decreased memory and decreased sitting balance, decreased standing balance, decreased postural control and decreased balance strategies.  Prior to hospitalization, patient could complete ADLs with min.  Patient will benefit from skilled intervention to decrease level of assist with basic self-care skills prior to discharge home with care partner.  Anticipate patient will require 24 hour supervision and minimal physical assistance and follow up home health.  OT - End of Session Activity Tolerance: Tolerates 10 - 20 min activity with multiple rests Endurance Deficit: Yes Endurance Deficit Description: generalized weakness OT Assessment Rehab Potential (ACUTE ONLY): Fair OT Barriers to Discharge: Neurogenic Bowel & Bladder OT Patient demonstrates impairments in the following area(s): Balance;Motor;Sensory;Skin Integrity;Cognition;Pain;Vision;Perception;Edema;Endurance;Safety OT Basic ADL's Functional Problem(s): Eating;Grooming;Bathing;Dressing;Toileting OT Transfers Functional  Problem(s): Toilet;Tub/Shower OT Additional Impairment(s): None OT Plan OT Intensity: Minimum of 1-2 x/day, 45 to 90 minutes OT Frequency: 5 out of 7 days OT Duration/Estimated Length of Stay: 3-4 weeks OT Treatment/Interventions: Balance/vestibular training;Cognitive remediation/compensation;Community reintegration;Disease mangement/prevention;Patient/family education;Self Care/advanced ADL retraining;Splinting/orthotics;Therapeutic Exercise;Wheelchair propulsion/positioning;UE/LE Coordination activities;Visual/perceptual remediation/compensation;UE/LE Strength taining/ROM;Skin care/wound managment;Psychosocial support;Pain management;Functional mobility training;DME/adaptive equipment instruction;Discharge planning;Therapeutic Activities OT Self Feeding Anticipated Outcome(s): set up assist OT Basic Self-Care Anticipated Outcome(s): min A OT Toileting Anticipated Outcome(s): min A OT Bathroom Transfers Anticipated Outcome(s): min A OT Recommendation Recommendations for Other Services: Neuropsych consult;Speech consult Patient destination: Home Follow Up Recommendations: Home health OT Equipment Recommended: To be determined   Skilled Therapeutic Intervention Skilled OT evaluation completed. ADLs completed as described below. Pt required mod-max A to maintain EOB sitting balance. Extensive education provided re OT POC, ELOS, condition insight, and bowel program. Pt completed bed mobility with max A, max of 2 to transfer sit <> supine. Pt assisted in transferring to bedpan but required assistance for digital stimulation (as he did PTA) and nursing was unable to come in a timely manner so pt was transferred off. Discussed implementation of bowel program. Pt left supine with all needs met, bed alarm set.   OT Evaluation Precautions/Restrictions  Precautions Precautions: Fall Precaution Comments: hx MS (R LE deficits > L LE, spastic diplegia) Restrictions Weight Bearing Restrictions:  No General Chart Reviewed: Yes Family/Caregiver Present: Yes Vital Signs   Pain Pain Assessment Pain Scale: 0-10 Pain Score: 0-No pain Faces Pain Scale: No hurt Home Living/Prior Functioning Home Living Family/patient expects to be discharged to:: Private residence Living Arrangements: Spouse/significant other Available Help at Discharge: Family Type of Home: House Home Access: Ramped entrance Home  Layout: Able to live on main level with bedroom/bathroom Bathroom Shower/Tub: Multimedia programmer: Handicapped height Bathroom Accessibility: Yes Additional Comments: sleeps in [employer] lift chair  Lives With: Spouse IADL History Homemaking Responsibilities: No Current License: No Prior Function Level of Independence: Needs assistance with ADLs, Needs assistance with gait(min-mod A for ADLs)  Able to Take Stairs?: No Driving: No Vocation: Retired Comments: wife assists with transfers ADL ADL Eating: Minimal assistance Where Assessed-Eating: Bed level Grooming: Minimal assistance Where Assessed-Grooming: Bed level Upper Body Bathing: Minimal assistance Where Assessed-Upper Body Bathing: Edge of bed Lower Body Bathing: Maximal assistance Where Assessed-Lower Body Bathing: Bed level Upper Body Dressing: Moderate assistance Where Assessed-Upper Body Dressing: Edge of bed Lower Body Dressing: Dependent Where Assessed-Lower Body Dressing: Bed level Toileting: Dependent Where Assessed-Toileting: Bed level Toilet Transfer: Unable to assess Toilet Transfer Method: Unable to assess Tub/Shower Transfer: Unable to assess Vision Baseline Vision/History: Wears glasses Wears Glasses: At all times Patient Visual Report: Diplopia("comes and goes") Vision Assessment?: Yes Ocular Range of Motion: Within Functional Limits Alignment/Gaze Preference: Within Defined Limits Tracking/Visual Pursuits: Able to track stimulus in all quads without difficulty Saccades: Within  functional limits Diplopia Assessment: Other (comment);Present in primary gaze(only in far gaze) Perception  Perception: Impaired [place]: reports its impaired Praxis Praxis: Intact Cognition Overall Cognitive Status: Impaired/Different from baseline Arousal/Alertness: Awake/alert Orientation Level: Person;Place;Situation Person: Oriented Place: Oriented Situation: Oriented Year: 2020 Month: December Day of Week: Correct Memory: Impaired Memory Impairment: Decreased short term memory;Decreased recall of new information Decreased Short Term Memory: Verbal basic;Functional basic Immediate Memory Recall: Sock;Blue;Bed Memory Recall Sock: Without Cue Memory Recall Blue: Not able to recall Memory Recall Bed: Not able to recall Attention: Sustained Sustained Attention: Impaired Awareness: Appears intact Problem Solving: Impaired Problem Solving Impairment: Verbal complex Safety/Judgment: Appears intact Sensation Sensation Light Touch: Impaired Detail Central sensation comments: L distal foot numbness, inconsistent Light Touch Impaired Details: Impaired LLE Coordination Gross Motor Movements are Fluid and Coordinated: No Fine Motor Movements are Fluid and Coordinated: No Coordination and Movement Description: Ataxic, spastic RLE. Generalized weakness Finger Nose Finger Test: Ataxic Motor  Motor Motor: Ataxia;Motor apraxia;Abnormal tone Motor - Skilled Clinical Observations: hx of MS Mobility  Bed Mobility Bed Mobility: Rolling Right;Rolling Left;Sit to Supine;Supine to Sit Rolling Right: Maximal Assistance - Patient 25-49% Rolling Left: Maximal Assistance - Patient 25-49% Supine to Sit: Maximal Assistance - Patient - Patient 25-49% Sit to Supine: Maximal Assistance - Patient 25-49%  Trunk/Postural Assessment  Cervical Assessment Cervical Assessment: Exceptions to WFL(forward head) Thoracic Assessment Thoracic Assessment: Exceptions to WFL(rounded shoulders,  compensates with kyphotic posture) Lumbar Assessment Lumbar Assessment: Exceptions to WFL(posterior pelvic tilt) Postural Control Postural Control: Deficits on evaluation Trunk Control: Limited  Balance Balance Balance Assessed: Yes Static Sitting Balance Static Sitting - Balance Support: Feet supported Static Sitting - Level of Assistance: 2: Max assist Dynamic Sitting Balance Dynamic Sitting - Balance Support: Feet supported Dynamic Sitting - Level of Assistance: 2: Max assist Extremity/Trunk Assessment RUE Assessment RUE Assessment: Exceptions to [facility] General Strength Comments: Full AROM, generalized weakness, 3+/5 LUE Assessment LUE Assessment: Exceptions to [facility] General Strength Comments: Full AROM, generalized weakness, 3+/5     Refer to Care Plan for Long Term Goals  Recommendations for other services: Neuropsych   Discharge Criteria: Patient will be discharged from OT if patient refuses treatment 3 consecutive times without medical reason, if treatment goals not met, if there is a change in medical status, if patient makes no progress towards goals or if  patient is discharged from hospital.  The above assessment, treatment plan, treatment alternatives and goals were discussed and mutually agreed upon: by patient and by family  Curtis Sites 12/08/2019, 12:43 PM

## 2019-12-09 ENCOUNTER — Inpatient Hospital Stay (HOSPITAL_COMMUNITY): Payer: Medicare Other

## 2019-12-09 ENCOUNTER — Inpatient Hospital Stay (HOSPITAL_COMMUNITY): Payer: Medicare Other | Admitting: Occupational Therapy

## 2019-12-09 ENCOUNTER — Inpatient Hospital Stay (HOSPITAL_COMMUNITY): Payer: Medicare Other | Admitting: Speech Pathology

## 2019-12-09 DIAGNOSIS — R5381 Other malaise: Secondary | ICD-10-CM

## 2019-12-09 MED ORDER — BACLOFEN 10 MG PO TABS
10.0000 mg | ORAL_TABLET | Freq: Three times a day (TID) | ORAL | Status: DC
  Administered 2019-12-09 – 2019-12-10 (×5): 10 mg via ORAL
  Filled 2019-12-09 (×6): qty 1

## 2019-12-09 NOTE — Plan of Care (Signed)
  Problem: Consults Goal: RH GENERAL PATIENT EDUCATION Description: See Patient Education module for education specifics. Outcome: Progressing   Problem: RH BOWEL ELIMINATION Goal: RH STG MANAGE BOWEL WITH ASSISTANCE Description: STG Manage Bowel with Min Assistance. Outcome: Progressing Goal: RH STG MANAGE BOWEL W/MEDICATION W/ASSISTANCE Description: STG Manage Bowel with Medication with Min Assistance. Outcome: Progressing   Problem: RH BLADDER ELIMINATION Goal: RH STG MANAGE BLADDER WITH ASSISTANCE Description: STG Manage Bladder With min Assistance Outcome: Progressing   Problem: RH SKIN INTEGRITY Goal: RH STG SKIN FREE OF INFECTION/BREAKDOWN Description: Min assist Outcome: Progressing Goal: RH STG MAINTAIN SKIN INTEGRITY WITH ASSISTANCE Description: STG Maintain Skin Integrity With Min Assistance. Outcome: Progressing   Problem: RH SAFETY Goal: RH STG ADHERE TO SAFETY PRECAUTIONS W/ASSISTANCE/DEVICE Description: STG Adhere to Safety Precautions With Min Assistance/Device. Outcome: Progressing Goal: RH STG DECREASED RISK OF FALL WITH ASSISTANCE Description: STG Decreased Risk of Fall With Min Assistance. Outcome: Progressing   

## 2019-12-09 NOTE — Progress Notes (Signed)
At approximately 2045, patient called for assistance. Noted patient sitting on the bedpan. He had been trying to have a bowel movement, but had not had success. He asked for a glove to self disimpact himself. Patient was informed that nursing could do that for him, evn thou he sated that he does it at home himself. Asked patient if he wanted to do it himself or have nursing help & he stated that he would like nursing to do so. He had some streaks in his underwear & wanted them disgarded & they were. Patient was digitally stimulated with the assistance of lubrication. Very firm, formed stool was noted in the rectal vault & was assisted to be removed. The first lump had to be removed, the 2nd was much larger & was guided. There were no surface cracks & was not dry. No signs of irritation noted. Patient did state that he felt some relief. We discussed his normal pattern, which he stated was daily, but had to be digitally stimulated to do so. He was given hygiene care & new underwear were placed at his request. No acute distress noted. His nurse came into the room as we were concluding. Will continue to monitor

## 2019-12-09 NOTE — Progress Notes (Signed)
Physical Therapy Session Note  Patient Details  Name: Stephen Wade MRN: [id number] Date of Birth: [birth year]  Today's Date: 12/09/2019 PT Individual Time: 0900-0945 PT Individual Time Calculation (min): 45 min   Short Term Goals: Week 1:  PT Short Term Goal 1 (Week 1): Pt will perform bed mobility with mod assist PT Short Term Goal 2 (Week 1): Pt will transfer to [facility] with mod assist PT Short Term Goal 3 (Week 1): Pt will perform sit<>stand with mod assist and BUE support on rail or in stedy PT Short Term Goal 4 (Week 1): Pt will propell WC >15ft with supevision assist PT Short Term Goal 5 (Week 1): Pt will maintain sittng balance EOB with mod assist up to 5 min to prepare for use of power WC/scooter.  Skilled Therapeutic Interventions/Progress Updates:   Received pt supine in bed, pt agreeable to therapy, and denied any pain throughout session. Session focused on functional mobility, bed mobility, transfers with slideboard, UE strength, dynamic sitting balance, and improved activity tolerance. Pt rolled to R with mod A with HOB elevated and use of bedrails. Pt transferred R sidelying<>sitting EOB with max A. Pt required max A initially to sit EOB decreasing to min A with bilateral UE hold on side of bed. Pt transferred bed<>WC to L via slideboard +2 assist. Pt demonstrated poor trunk control, and required total assist to manage LE's during transfer. Pt required +2 assist to scoot back in [facility] to reposition hips due to decreased trunk control and bilateral LE weakness. Pt requires min/mod A to lean forward due to decreased trunk and postural control. Pt performed WC mobility 44ft with bilateral UE's and supervision with increased time. Pt performed UE strengthening on UBE at level 2.5 for 1 minute 30 seconds forward and 2 minutes backwards. Therapist provided cues for pt to bring trunk forward to avoid leaning on backrest to improve trunk control, however pt unable to complete and required pillow  behind back to bring trunk forward during UBE activity. Pt transported back to room. Concluded session with pt sitting in WC, needs within reach, pt refused seatbelt alarm stating "I can't get up, and I know not to try".   Therapy Documentation Precautions:  Precautions Precautions: Fall Precaution Comments: hx MS (R LE deficits > L LE, spastic diplegia) Restrictions Weight Bearing Restrictions: No   Therapy/Group: Individual Therapy Alfonse Alpers PT, DPT   12/09/2019, 7:35 AM

## 2019-12-09 NOTE — Progress Notes (Signed)
[place] PHYSICAL MEDICINE & REHABILITATION PROGRESS NOTE  Subjective/Complaints: Patient seen sitting up in bed this morning.  He repeatedly states that he would like to brush his teeth.  He states he slept well overnight.  He states he had a good first day of therapies yesterday.  He needs encouragement to participate in MMT, stating he cannot do it before trying.  Encouraged patient to try to be aggressive about oral hydration in spite of nectar liquids.  ROS: Denies CP, SOB, N/V/D  Objective: Vital Signs: Blood pressure 130/70, pulse 63, temperature 98.1 F (36.7 C), temperature source Oral, resp. rate 16, height 6' (1.829 m), weight 91.7 kg, SpO2 94 %. DG CHEST PORT 1 VIEW  Result Date: 12/07/2019 CLINICAL DATA:  Cephalopathy. EXAM: PORTABLE CHEST 1 VIEW COMPARISON:  CT 11/29/2019.  Chest x-ray 11/28/2019. FINDINGS: Heart size stable. No pulmonary venous congestion. Bibasilar infiltrates, improved from prior exams. No pleural effusion or pneumothorax. IMPRESSION: Bibasilar pulmonary infiltrates, improved from prior exams. Continued follow-up exams to demonstrate complete clearing suggested. Electronically Signed   By: Maisie Fus  Register   On: 12/07/2019 13:47   Recent Labs    12/07/19 1614 12/08/19 0451  WBC 8.0 8.6  HGB 12.8* 11.9*  HCT 37.8* 35.9*  PLT 239 250   Recent Labs    12/07/19 1331 12/07/19 1614 12/08/19 0451  NA 145  --  148*  K 3.8  --  4.3  CL 113*  --  114*  CO2 23  --  23  GLUCOSE 168*  --  113*  BUN 33*  --  31*  CREATININE 0.69 0.92 0.75  CALCIUM 9.2  --  9.1    Physical Exam: BP 130/70 (BP Location: Right Arm)   Pulse 63   Temp 98.1 F (36.7 C) (Oral)   Resp 16   Ht 6' (1.829 m)   Wt 91.7 kg   SpO2 94%   BMI 27.42 kg/m  Constitutional: No distress . Vital signs reviewed. HENT: Normocephalic.  Atraumatic. Eyes: EOMI. No discharge. Cardiovascular: No JVD. Respiratory: Normal effort.  No stridor. GI: Non-distended. Skin: Sacral ulcer not  examined Psych: Slowed. GU: + Foley. Musc: Lower extremity edema Neuro: Alert Motor: Bilateral upper extremities: 4+ -5/5 proximal distal Right lower extremity: 1+/5 proximal distal Left lower extremity: Hip flexion, knee extension 2/5, ankle dorsiflexion 2/5 Sensation intact to light touch Dysarthria Increased tone in bilateral lower extremities  Assessment/Plan: 1. Functional deficits secondary to debility which require 3+ hours per day of interdisciplinary therapy in a comprehensive inpatient rehab setting.  Physiatrist is providing close team supervision and 24 hour management of active medical problems listed below.  Physiatrist and rehab team continue to assess barriers to discharge/monitor patient progress toward functional and medical goals  Care Tool:  Bathing    Body parts bathed by patient: Right arm, Chest, Left arm, Abdomen, Front perineal area, Face(bed level)   Body parts bathed by helper: Buttocks, Right upper leg, Left upper leg, Right lower leg, Left lower leg     Bathing assist Assist Level: Maximal Assistance - Patient 24 - 49%     Upper Body Dressing/Undressing Upper body dressing   What is the patient wearing?: Pull over shirt    Upper body assist Assist Level: Moderate Assistance - Patient 50 - 74%    Lower Body Dressing/Undressing Lower body dressing      What is the patient wearing?: Pants, Incontinence brief     Lower body assist Assist for lower body dressing: 2 Helpers  Toileting Toileting    Toileting assist Assist for toileting: Dependent - Patient 0%     Transfers Chair/bed transfer  Transfers assist  Chair/bed transfer activity did not occur: Safety/medical concerns  Chair/bed transfer assist level: Total Assistance - Patient < 25%     Locomotion Ambulation   Ambulation assist   Ambulation activity did not occur: Safety/medical concerns          Walk 10 feet activity   Assist  Walk 10 feet activity did  not occur: Safety/medical concerns        Walk 50 feet activity   Assist Walk 50 feet with 2 turns activity did not occur: Safety/medical concerns         Walk 150 feet activity   Assist Walk 150 feet activity did not occur: Safety/medical concerns         Walk 10 feet on uneven surface  activity   Assist Walk 10 feet on uneven surfaces activity did not occur: Safety/medical concerns         Wheelchair     Assist        Wheelchair assist level: Minimal Assistance - Patient > 75% Max wheelchair distance: 165ft    Wheelchair 50 feet with 2 turns activity    Assist        Assist Level: Minimal Assistance - Patient > 75%   Wheelchair 150 feet activity     Assist Wheelchair 150 feet activity did not occur: Safety/medical concerns          Medical Problem List and Plan: 1.  Decreased functional mobility secondary to acute respiratory distress with hypoxic respiratory failure secondary to multi focal pneumonia in the setting of MS.    Follow-up chest x-ray 12/07/2019 bibasilar pulmonary infiltrates improved from prior exams.  Patient is completing a 10-day course of Augmentin on 12/17  Completed 5-day course of IV Solu-Medrol.    Continue CIR 2.  Antithrombotics: DVT/anticoagulation: Subcutaneous Lovenox.  CT angiogram of chest negative for pulmonary emboli             Antiplatelet therapy: N/A 3. Pain Management/spasticity: Baclofen 5 mg 3 times daily 4. Mood: Provide emotional support             -antipsychotic agents: N/A 5. Neuropsych: This patient is not capable of making decisions on his own behalf. 6. Skin/Wound Care: Routine skin checks/pressure injury to buttocks.  Follow-up skin care checks 7. Fluids/Electrolytes/Nutrition: Routine in and outs 8.  Atrial fibrillation with RVR.  IV Cardizem discontinued due to hypotension.  No current plan for anticoagulation  Monitor with increased mobility. 9.  Neurogenic bowel bladder with  recurrent UTIs.  Indwelling Foley catheter tube to remain in place at the recommendations of urology services per plan outpatient evaluation for possible need for suprapubic tube 10.  Dysphagia.  Dysphagia #1 nectar liquids.  Follow-up speech therapy.    Advance diet as tolerated. 11.AKI:   BUN remains elevated  Cont IVF 12. Spasticity in setting of MS  Baclofen increased to home dose of 10 mg 3 times daily on 12/17, monitor for confusion 13. Steroid induced hyperglycemia  Cont to monitor 14.  Hypernatremia  Sodium 148 on 12/16, labs ordered for tomorrow  Cont to monitor 15. Hypoalbuminemia  Supplement initiated on 12/16 16.  Transaminitis  LFTs elevated on 12/16, labs ordered for tomorrow  Continue to monitor 17. ABLA  Hb 11.9 on 12/16, labs ordered for tomorrow  Cont to monitor  LOS: 2 days A FACE TO  FACE EVALUATION WAS PERFORMED   Lorie Phenix 12/09/2019, 8:35 AM

## 2019-12-09 NOTE — Progress Notes (Signed)
Occupational Therapy Session Note  Patient Details  Name: Stephen Wade MRN: [id number] Date of Birth: [birth year]  Today's Date: 12/09/2019 OT Individual Time: 1410-1504 OT Individual Time Calculation (min): 54 min    Short Term Goals: Week 1:  OT Short Term Goal 1 (Week 1): Pt will don shirt with mod A OT Short Term Goal 2 (Week 1): Pt will maintain EOB sitting balance with mod A OT Short Term Goal 3 (Week 1): Pt will complete LB dressing with max A  Skilled Therapeutic Interventions/Progress Updates:    Treatment session with focus on self-care retraining and activity tolerance.  Pt received semi-reclined in bed reporting fatigue and not wanting to get OOB this session.  Pt stated "I already got up once today, and I'm not doing it again". Reiterated focus of therapy and encouraged pt to get OOB with pt continuing to refuse.  Engaged in 2 sets of 10 chest presses with 2# dowel.  Pt stating weight was "nothing", yet noted decreased motor control during task.  Pt requesting shower in the AM.  Pt states it typically takes 90 mins for bathing and dressing at home with assistance from caregiver.  Discussed typical length of therapy sessions and plan to attempt bathing in the AM.  Pt stated if he did not get in the shower, that he might as well go home because he can do everything that has been done in therapy at home.  Again reiterated current level of care and need to address strength, core stability, transfers, and ability to engage in self-care tasks.  Lengthy discussion about goals and personal goals as well as PLOF. Provided pt with roll in shower chair to attempt shower in the AM.    Therapy Documentation Precautions:  Precautions Precautions: Fall Precaution Comments: hx MS (R LE deficits > L LE, spastic diplegia) Restrictions Weight Bearing Restrictions: No General:   Vital Signs: Therapy Vitals Pulse Rate: (!) 58 Resp: 14 BP: (!) 142/79 Patient Position (if appropriate):  Sitting Oxygen Therapy SpO2: 98 % O2 Device: Room Air Pain: Pain Assessment Pain Scale: Faces Faces Pain Scale: No hurt   Therapy/Group: Individual Therapy  Simonne Come 12/09/2019, 3:19 PM

## 2019-12-09 NOTE — Progress Notes (Signed)
Speech Language Pathology Daily Session Note  Patient Details  Name: Stephen Wade MRN: [id number] Date of Birth: Jul 25, 1951  Today's Date: 12/09/2019 SLP Individual Time: 1100-1200 SLP Individual Time Calculation (min): 60 min  Short Term Goals: Week 1: SLP Short Term Goal 1 (Week 1): Pt will consume Dys 1/nectar diet with Min overt s/sx aspiration and efficient mastication and oral clearance with Min A verbal cues for use of swallow strtaegies. SLP Short Term Goal 2 (Week 1): Pt will consume trials of thin H2O via teaspoon with Min A verbal cues for use of swallow strategies and Min overt s/sx aspiration. SLP Short Term Goal 3 (Week 1): Pt will demonstrate recall of new/day to day information with Mod A verbal/visual cues for use of compensatory strategies and/or external aids. SLP Short Term Goal 4 (Week 1): Pt will demonstrate ability to problem solve during functional mildly complex tasks with Mod A verbal/visual cues. SLP Short Term Goal 5 (Week 1): Pt will use speech intelligibility strategie with Min A verbal cues to achieve 80% intelligibility at the sentence level.  Skilled Therapeutic Interventions: Pt was seen for skilled ST targeting dysphagia, speech, and diagnostic treatment of cognition. Pt scored a 21/30 on MOCA version 7.1 (WNL >= 26), indicative of mild-mod impairments in short term memory and executive function. Pt required category or multiple choice cues to recall 5/5 newly learned words with a 5 minute delay. Pt did express that he did not believe his memory deficits impact him functionally, thus SLP provided examples of relevant information pertaining to his swallowing function/precautions he did not recall --> safety impact. SLP further facilitated session with introduction of speech intelligibility strategies, which pt demonstrated use of with Mod A verbal cues. Emphasis on overarticulate and increased vocal intensity, as pt already uses slow rate Mod I. SLP also provided in  depth education regarding aspiration and aspiration pneumonia, as pt continued to express he felt as though he didn't understand why he is recommended to drink thickened liquids. Pt appeared to comprehend rationale after explanation supported by visual aids X2. SLP also introduced RMST exercises to promote pharyngeal strengthening and stronger cough. Pt completed 8 sets of RMST with device set to 30 cm H2O, and he reported perceived difficulty level of 6/10. When SLP increased device resistance to 35 cm H20, pt unable to successfully complete exercises, therefore device reset to 30 cm H2O. Recommended pt complete 10 sets 3 times daily. Pt left sitting in wheelchair with seatbelt alarm in place and all needs within reach. Continue per current plan of care.       Pain Pain Assessment Pain Scale: Faces Faces Pain Scale: No hurt  Therapy/Group: Individual Therapy  Arbutus Leas 12/09/2019, 12:13 PM

## 2019-12-10 ENCOUNTER — Inpatient Hospital Stay (HOSPITAL_COMMUNITY): Payer: Medicare Other | Admitting: Occupational Therapy

## 2019-12-10 ENCOUNTER — Inpatient Hospital Stay (HOSPITAL_COMMUNITY): Payer: Medicare Other

## 2019-12-10 ENCOUNTER — Inpatient Hospital Stay (HOSPITAL_COMMUNITY): Payer: Medicare Other | Admitting: Speech Pathology

## 2019-12-10 DIAGNOSIS — F329 Major depressive disorder, single episode, unspecified: Secondary | ICD-10-CM

## 2019-12-10 DIAGNOSIS — F32A Depression, unspecified: Secondary | ICD-10-CM

## 2019-12-10 LAB — CBC WITH DIFFERENTIAL/PLATELET
Abs Immature Granulocytes: 0.26 10*3/uL — ABNORMAL HIGH (ref 0.00–0.07)
Basophils Absolute: 0 10*3/uL (ref 0.0–0.1)
Basophils Relative: 0 %
Eosinophils Absolute: 0.1 10*3/uL (ref 0.0–0.5)
Eosinophils Relative: 1 %
HCT: 37.2 % — ABNORMAL LOW (ref 39.0–52.0)
Hemoglobin: 12.3 g/dL — ABNORMAL LOW (ref 13.0–17.0)
Immature Granulocytes: 3 %
Lymphocytes Relative: 7 %
Lymphs Abs: 0.5 10*3/uL — ABNORMAL LOW (ref 0.7–4.0)
MCH: 31 pg (ref 26.0–34.0)
MCHC: 33.1 g/dL (ref 30.0–36.0)
MCV: 93.7 fL (ref 80.0–100.0)
Monocytes Absolute: 0.5 10*3/uL (ref 0.1–1.0)
Monocytes Relative: 6 %
Neutro Abs: 6.2 10*3/uL (ref 1.7–7.7)
Neutrophils Relative %: 83 %
Platelets: 240 10*3/uL (ref 150–400)
RBC: 3.97 MIL/uL — ABNORMAL LOW (ref 4.22–5.81)
RDW: 16.4 % — ABNORMAL HIGH (ref 11.5–15.5)
WBC: 7.6 10*3/uL (ref 4.0–10.5)
nRBC: 0.4 % — ABNORMAL HIGH (ref 0.0–0.2)

## 2019-12-10 LAB — COMPREHENSIVE METABOLIC PANEL
ALT: 136 U/L — ABNORMAL HIGH (ref 0–44)
AST: 44 U/L — ABNORMAL HIGH (ref 15–41)
Albumin: 2.8 g/dL — ABNORMAL LOW (ref 3.5–5.0)
Alkaline Phosphatase: 100 U/L (ref 38–126)
Anion gap: 9 (ref 5–15)
BUN: 27 mg/dL — ABNORMAL HIGH (ref 8–23)
CO2: 23 mmol/L (ref 22–32)
Calcium: 9.1 mg/dL (ref 8.9–10.3)
Chloride: 115 mmol/L — ABNORMAL HIGH (ref 98–111)
Creatinine, Ser: 0.65 mg/dL (ref 0.61–1.24)
GFR calc Af Amer: >60 mL/min (ref >60)
GFR calc non Af Amer: >60 mL/min (ref >60)
Glucose, Bld: 61 mg/dL — ABNORMAL LOW (ref 70–99)
Potassium: 4.4 mmol/L (ref 3.5–5.1)
Sodium: 147 mmol/L — ABNORMAL HIGH (ref 135–145)
Total Bilirubin: 0.6 mg/dL (ref 0.3–1.2)
Total Protein: 5.4 g/dL — ABNORMAL LOW (ref 6.5–8.1)

## 2019-12-10 MED ORDER — FLUOXETINE HCL 10 MG PO CAPS
10.0000 mg | ORAL_CAPSULE | Freq: Every day | ORAL | Status: DC
  Administered 2019-12-10 – 2019-12-17 (×7): 10 mg via ORAL
  Filled 2019-12-10 (×8): qty 1

## 2019-12-10 NOTE — Progress Notes (Signed)
Speech Language Pathology Daily Session Note  Patient Details  Name: Stephen Wade MRN: [id number] Date of Birth: Dec 20, 1951  Today's Date: 12/10/2019 SLP Individual Time: 8938-1017 SLP Individual Time Calculation (min): 45 min  Short Term Goals: Week 1: SLP Short Term Goal 1 (Week 1): Pt will consume Dys 1/nectar diet with Min overt s/sx aspiration and efficient mastication and oral clearance with Min A verbal cues for use of swallow strtaegies. SLP Short Term Goal 2 (Week 1): Pt will consume trials of thin H2O via teaspoon with Min A verbal cues for use of swallow strategies and Min overt s/sx aspiration. SLP Short Term Goal 3 (Week 1): Pt will demonstrate recall of new/day to day information with Mod A verbal/visual cues for use of compensatory strategies and/or external aids. SLP Short Term Goal 4 (Week 1): Pt will demonstrate ability to problem solve during functional mildly complex tasks with Mod A verbal/visual cues. SLP Short Term Goal 5 (Week 1): Pt will use speech intelligibility strategie with Min A verbal cues to achieve 80% intelligibility at the sentence level.  Skilled Therapeutic Interventions: Skilled treatment session focused on speech and dysphagia goals. Upon arrival, patient was receiving medications from RN, therefore, the water protocol was not administered. SLP facilitated session by providing Max A multimodal cues for patient to complete EMST exercises with the EMST 150 at 30 cm H2O. Patient unable to complete 2/2 too much resistance, therefore, the phillips EMST device was introduced for use of lower resistance. Patient completed 15 repetitions of EMST exercises at 15 cm H2O with Mod verbal cues for accuracy. Patient was unable to complete 25 repetitions due to fatigue despite reporting a self-perceived effort level of 6/10.  Recommend patient perform 15 repetitions, twice daily. Patient also utilized his speech intelligibility strategies at the sentence level with Min A  verbal cues to achieve 90% intelligibility during an informal conversation. However, Mod verbal cues were needed for patient to self-monitor and correct anterior spillage of saliva throughout session. Patient left upright in bed with alarm on and all needs within reach. Continue with current plan of care.      Pain No/Denies Pain   Therapy/Group: Individual Therapy  Teniqua Marron 12/10/2019, 1:41 PM

## 2019-12-10 NOTE — Progress Notes (Signed)
Patient called to the nurses station. When arrived, patient stated that he could not do this. When questioned about what he meant, he continuously stated the same thing & seemed a little hopeless. Continued to ask questions until patient stated that he couldn't do anything & he has MS. He also said that his family put him here. Explained to patient that rehab was where patients come to get better & that it was a process for everyone. When he stated that he couldn't do this, he was reminded that he just started in rehab, it's a new place with different schedules, patients are here for different reasons needing different things & that it takes time to see the progress & not to give up yet. He asked if we see patients like him. Stayed with patient for awhile trying to lift his spirits. He seemed a little more hopeful & stated that he should just relax a little more & try to work the program. No acute distress noted. Will continue to monitor.

## 2019-12-10 NOTE — Progress Notes (Signed)
Physical Therapy Session Note  Patient Details  Name: Stephen Wade MRN: [id number] Date of Birth: 1951/03/10  Today's Date: 12/10/2019 PT Individual Time: 1300-1400 PT Individual Time Calculation (min): 60 min   Short Term Goals: Week 1:  PT Short Term Goal 1 (Week 1): Pt will perform bed mobility with mod assist PT Short Term Goal 2 (Week 1): Pt will transfer to [facility] with mod assist PT Short Term Goal 3 (Week 1): Pt will perform sit<>stand with mod assist and BUE support on rail or in stedy PT Short Term Goal 4 (Week 1): Pt will propell WC >167ft with supevision assist PT Short Term Goal 5 (Week 1): Pt will maintain sittng balance EOB with mod assist up to 5 min to prepare for use of power WC/scooter.  Skilled Therapeutic Interventions/Progress Updates:   Received pt supine in bed, pt agreeable to therapy, and denied any pain during today's session. Wife present during session. Session focused on functional mobility/transfers, sitting balance and postural control, LE strength, and improved tolerance to activity. Pt with increased confusion today stating "why did you put me here?". Wife and therapist reoriented patient to situation, however pt remained fixated on subject and convinced that he was brought here against his will. Pt rolled to R with max A and transferred supine<>sitting EOB +2 helpers for LE management and postural control. Pt transferred bed<>WC via slideboard +2 assistance. Pt with poor safety awareness and judgement and when asked if pt thought he could perform bed<>WC transfer independtly stated "Yes, I could have done it by myself". Wife stated she doesn't understand why pt has become more confused. Pt performed WC mobility 60ft with bilateral UE's and supervision. Pt transported to gym in [facility] remainder of way for energy conservation. Pt transferred WC<>mat and mat<>WC via slideboard +2 assistance x 2 trials throughout session. Seated on mat pt worked on static sitting balance with  and without bilateral UE support CGA-mod A with increased fatigue. Pt required max verbal cues for upright posture and to bring shoulders back and chest up, however pt demonstrated poor cary over. Pt with decreased sitting balance with increased fatigue however when therapist asked pt if he was fatigued, pt denied but demonstrated poor postural control. Pt reached outside BOS and threw horseshoes 5 reps x 2 trials with CGA/mod A for sitting balance. Pt appeared fatigued and took lying rest break on mat. Pt c/o increased LBP and required +2 assistance to transfer supine<>sitting on mat. Therapist performed passive RLE hamstring stretch 3x30 second hold with pt seated in [facility] and educated wife on stretching technique and body mechanics for her to perform with him outside of therapy. Concluded session with pt sitting in WC, needs within reach, and no alarm on due to pt refusal. Wife present and agreed to notify nursing if she leaves.   Therapy Documentation Precautions:  Precautions Precautions: Fall Precaution Comments: hx MS (R LE deficits > L LE, spastic diplegia) Restrictions Weight Bearing Restrictions: No   Therapy/Group: Individual Therapy Alfonse Alpers PT, DPT   12/10/2019, 7:44 AM

## 2019-12-10 NOTE — Plan of Care (Signed)
  Problem: Consults Goal: RH GENERAL PATIENT EDUCATION Description: See Patient Education module for education specifics. Outcome: Progressing   Problem: RH BOWEL ELIMINATION Goal: RH STG MANAGE BOWEL WITH ASSISTANCE Description: STG Manage Bowel with Min Assistance. Outcome: Progressing Goal: RH STG MANAGE BOWEL W/MEDICATION W/ASSISTANCE Description: STG Manage Bowel with Medication with Min Assistance. Outcome: Progressing   Problem: RH BLADDER ELIMINATION Goal: RH STG MANAGE BLADDER WITH ASSISTANCE Description: STG Manage Bladder With min Assistance Outcome: Progressing   Problem: RH SKIN INTEGRITY Goal: RH STG SKIN FREE OF INFECTION/BREAKDOWN Description: Min assist Outcome: Progressing Goal: RH STG MAINTAIN SKIN INTEGRITY WITH ASSISTANCE Description: STG Maintain Skin Integrity With Min Assistance. Outcome: Progressing   Problem: RH SAFETY Goal: RH STG ADHERE TO SAFETY PRECAUTIONS W/ASSISTANCE/DEVICE Description: STG Adhere to Safety Precautions With Min Assistance/Device. Outcome: Progressing Goal: RH STG DECREASED RISK OF FALL WITH ASSISTANCE Description: STG Decreased Risk of Fall With Min Assistance. Outcome: Progressing   

## 2019-12-10 NOTE — Progress Notes (Signed)
Occupational Therapy Session Note  Patient Details  Name: Stephen Wade MRN: [id number] Date of Birth: July 31, 1951  Today's Date: 12/10/2019 OT Individual Time: 5621-3086 OT Individual Time Calculation (min): 64 min    Short Term Goals: Week 1:  OT Short Term Goal 1 (Week 1): Pt will don shirt with mod A OT Short Term Goal 2 (Week 1): Pt will maintain EOB sitting balance with mod A OT Short Term Goal 3 (Week 1): Pt will complete LB dressing with max A  Skilled Therapeutic Interventions/Progress Updates:    Treatment session with focus on functional mobility, sitting balance, and endurance during self-care tasks.  Pt received supine in bed agreeable to therapy session and shower.  Pt completed bed mobility with Max +2 to come to sitting at EOB.  Pt with decreased trunk control requiring physical assistance to maintain sitting at EOB while setting up for transfer.  Utilized Stedy for sit > stand and transfer in to room shower.  +2 for anterior weight shift to achieve sit > stand.  Engaged in bathing from roll in shower chair in shower to provide lateral supports in sitting and open cutout to allow access to wash buttocks.  Pt engaged in washing UB and upper legs, requiring assistance for LB and buttocks.  Total assist +2 sit > stand in [place] from shower chair.  Returned to bed and completed dressing from bed level with rolling and +2 to assist with rolling to pull pants over hips.  Positioned upright in bed to engage in self-feeding.    Therapy Documentation Precautions:  Precautions Precautions: Fall Precaution Comments: hx MS (R LE deficits > L LE, spastic diplegia) Restrictions Weight Bearing Restrictions: No Pain: Pain Assessment Pain Scale: 0-10 Pain Score: 0-No pain   Therapy/Group: Individual Therapy  Simonne Come 12/10/2019, 12:33 PM

## 2019-12-10 NOTE — Progress Notes (Signed)
Could not get patient's temperature orally, took patient's temperature rectally which was 93.9, called Dan, PA who reviewed lab work, renal function within normal limits, no new orders received. Patient is asymptomatic. Gave warm fluids po and extra blankets.  [place]

## 2019-12-10 NOTE — Progress Notes (Signed)
[place] PHYSICAL MEDICINE & REHABILITATION PROGRESS NOTE  Subjective/Complaints: Patient seen sitting up this morning working with therapies.  He states he slept fairly overnight.  Required adjustments to improve bowel movement yesterday and appears depressed this morning.  He also asks me why he is here and why he needs to be here.  ROS: Denies CP, SOB, N/V/D  Objective: Vital Signs: Blood pressure 136/78, pulse 80, temperature 98.9 F (37.2 C), resp. rate 18, height 6' (1.829 m), weight 91.7 kg, SpO2 94 %. No results found. Recent Labs    12/08/19 0451 12/10/19 0607  WBC 8.6 7.6  HGB 11.9* 12.3*  HCT 35.9* 37.2*  PLT 250 240   Recent Labs    12/08/19 0451 12/10/19 0607  NA 148* 147*  K 4.3 4.4  CL 114* 115*  CO2 23 23  GLUCOSE 113* 61*  BUN 31* 27*  CREATININE 0.75 0.65  CALCIUM 9.1 9.1    Physical Exam: BP 136/78 (BP Location: Left Arm)   Pulse 80   Temp 98.9 F (37.2 C)   Resp 18   Ht 6' (1.829 m)   Wt 91.7 kg   SpO2 94%   BMI 27.42 kg/m  Constitutional: No distress . Vital signs reviewed. HENT: Normocephalic.  Atraumatic. Eyes: EOMI. No discharge. Cardiovascular: No JVD. Respiratory: Normal effort.  No stridor. GI: Non-distended. Skin: Sacral ulcer not examined. Psych: Slowed. Depressed.  Musc: Lower extremity edema GU: + Foley Neuro: Alert Motor: Bilateral upper extremities: 4+ -5/5 proximal distal Right lower extremity: 1/5 proximal distal Left lower extremity: Hip flexion, knee extension 1+/5, ankle dorsiflexion 1+/5 Dysarthria Increased tone in bilateral lower extremities  Assessment/Plan: 1. Functional deficits secondary to debility which require 3+ hours per day of interdisciplinary therapy in a comprehensive inpatient rehab setting.  Physiatrist is providing close team supervision and 24 hour management of active medical problems listed below.  Physiatrist and rehab team continue to assess barriers to discharge/monitor patient  progress toward functional and medical goals  Care Tool:  Bathing    Body parts bathed by patient: Right arm, Chest, Left arm, Abdomen, Front perineal area, Face(bed level)   Body parts bathed by helper: Buttocks, Right upper leg, Left upper leg, Right lower leg, Left lower leg     Bathing assist Assist Level: Maximal Assistance - Patient 24 - 49%     Upper Body Dressing/Undressing Upper body dressing   What is the patient wearing?: Pull over shirt    Upper body assist Assist Level: Moderate Assistance - Patient 50 - 74%    Lower Body Dressing/Undressing Lower body dressing      What is the patient wearing?: Incontinence brief     Lower body assist Assist for lower body dressing: Dependent - Patient 0%     Toileting Toileting    Toileting assist Assist for toileting: Dependent - Patient 0%     Transfers Chair/bed transfer  Transfers assist  Chair/bed transfer activity did not occur: Safety/medical concerns  Chair/bed transfer assist level: 2 Helpers(steady)     Locomotion Ambulation   Ambulation assist   Ambulation activity did not occur: Safety/medical concerns          Walk 10 feet activity   Assist  Walk 10 feet activity did not occur: Safety/medical concerns        Walk 50 feet activity   Assist Walk 50 feet with 2 turns activity did not occur: Safety/medical concerns         Walk 150 feet activity  Assist Walk 150 feet activity did not occur: Safety/medical concerns         Walk 10 feet on uneven surface  activity   Assist Walk 10 feet on uneven surfaces activity did not occur: Safety/medical concerns         Wheelchair     Assist Will patient use wheelchair at discharge?: Yes Type of Wheelchair: Manual    Wheelchair assist level: Supervision/Verbal cueing Max wheelchair distance: 9ft    Wheelchair 50 feet with 2 turns activity    Assist        Assist Level: Supervision/Verbal cueing    Wheelchair 150 feet activity     Assist Wheelchair 150 feet activity did not occur: Safety/medical concerns          Medical Problem List and Plan: 1.  Decreased functional mobility secondary to acute respiratory distress with hypoxic respiratory failure secondary to multi focal pneumonia in the setting of MS.    Follow-up chest x-ray 12/07/2019 bibasilar pulmonary infiltrates improved from prior exams.  Patient completed 10-day course of Augmentin on 12/17  Completed 5-day course of IV Solu-Medrol.    Continue CIR 2.  Antithrombotics: DVT/anticoagulation: Subcutaneous Lovenox.  CT angiogram of chest negative for pulmonary emboli             Antiplatelet therapy: N/A 3. Pain Management/spasticity: Baclofen 5 mg 3 times daily 4. Mood: Provide emotional support  Will request Neuropsych consult  Fluoxetine started on 12/18             -antipsychotic agents: N/A 5. Neuropsych: This patient is not capable of making decisions on his own behalf. 6. Skin/Wound Care: Routine skin checks/pressure injury to buttocks.  Follow-up skin care checks 7. Fluids/Electrolytes/Nutrition: Routine in and outs 8.  Atrial fibrillation with RVR.  IV Cardizem discontinued due to hypotension.  No current plan for anticoagulation  Monitor with increased mobility. 9.  Neurogenic bowel bladder with recurrent UTIs.  Indwelling Foley catheter tube to remain in place at the recommendations of urology services per plan outpatient evaluation for possible need for suprapubic tube 10.  Dysphagia.  Dysphagia #1 nectar liquids.  Follow-up speech therapy.    Advance diet as tolerated. 11.AKI:   BUN remains elevated, but improving on 12/18  Cont IVF 12. Spasticity in setting of MS  Baclofen increased to home dose of 10 mg 3 times daily on 12/17, tolerating 13. Steroid induced hyperglycemia: ?Resolved  Cont to monitor 14.  Hypernatremia  Sodium 147 on 12/18, labs ordered for Monday  Cont to monitor 15.  Hypoalbuminemia  Supplement initiated on 12/16 16.  Transaminitis  LFTs elevated, but improving on 12/18  Continue to monitor 17. ABLA  Hb 12.3 on 12/18  Cont to monitor  LOS: 3 days A FACE TO FACE EVALUATION WAS PERFORMED  Richar Dunklee Karis Juba 12/10/2019, 9:19 AM

## 2019-12-11 ENCOUNTER — Inpatient Hospital Stay (HOSPITAL_COMMUNITY): Payer: Medicare Other | Admitting: Occupational Therapy

## 2019-12-11 ENCOUNTER — Inpatient Hospital Stay (HOSPITAL_COMMUNITY): Payer: Medicare Other | Admitting: Speech Pathology

## 2019-12-11 ENCOUNTER — Inpatient Hospital Stay (HOSPITAL_COMMUNITY): Payer: Medicare Other | Admitting: Rehabilitation

## 2019-12-11 DIAGNOSIS — G801 Spastic diplegic cerebral palsy: Secondary | ICD-10-CM

## 2019-12-11 MED ORDER — BACLOFEN 5 MG HALF TABLET
5.0000 mg | ORAL_TABLET | Freq: Three times a day (TID) | ORAL | Status: DC
  Administered 2019-12-11 – 2019-12-22 (×32): 5 mg via ORAL
  Filled 2019-12-11 (×32): qty 1

## 2019-12-11 NOTE — Progress Notes (Signed)
Speech Language Pathology Daily Session Note  Patient Details  Name: Stephen Wade MRN: [id number] Date of Birth: 02-10-1951  Today's Date: 12/11/2019 SLP Individual Time: 1025-1040 SLP Individual Time Calculation (min): 15 min and Today's Date: 12/11/2019 SLP Missed Time: 30 Minutes Missed Time Reason: Patient fatigue  Short Term Goals: Week 1: SLP Short Term Goal 1 (Week 1): Pt will consume Dys 1/nectar diet with Min overt s/sx aspiration and efficient mastication and oral clearance with Min A verbal cues for use of swallow strtaegies. SLP Short Term Goal 2 (Week 1): Pt will consume trials of thin H2O via teaspoon with Min A verbal cues for use of swallow strategies and Min overt s/sx aspiration. SLP Short Term Goal 3 (Week 1): Pt will demonstrate recall of new/day to day information with Mod A verbal/visual cues for use of compensatory strategies and/or external aids. SLP Short Term Goal 4 (Week 1): Pt will demonstrate ability to problem solve during functional mildly complex tasks with Mod A verbal/visual cues. SLP Short Term Goal 5 (Week 1): Pt will use speech intelligibility strategie with Min A verbal cues to achieve 80% intelligibility at the sentence level.  Skilled Therapeutic Interventions: Skilled treatment session focused on cognitive and speech goals. Upon arrival, patient was asleep in bed. Patient was slow to rouse and required Max A multimodal cues to maintain his eyes open for ~30-60 second intervals. Attempted to use EMSt device without success with device falling out of hand due to lethargy. Patient also with minimal verbal output and decreased management of secretions resulting in increased anterior spillage of saliva. RN aware. Patient missed remaining 30 minutes of session due to fatigue and inability to participate effectively in treatment session. Patient left upright in bed with alarm on and all needs within reach. Continue with current plan of care.       Pain No/Denies Pain  Therapy/Group: Individual Therapy  Jesly Hartmann 12/11/2019, 12:20 PM

## 2019-12-11 NOTE — Progress Notes (Addendum)
Physical Therapy Session Note  Patient Details  Name: Stephen Wade MRN: [id number] Date of Birth: [birth year]  Today's Date: 12/11/2019 PT Individual Time: 1301-1400 PT Individual Time Calculation (min): 59 min   Short Term Goals: Week 1:  PT Short Term Goal 1 (Week 1): Pt will perform bed mobility with mod assist PT Short Term Goal 2 (Week 1): Pt will transfer to [facility] with mod assist PT Short Term Goal 3 (Week 1): Pt will perform sit<>stand with mod assist and BUE support on rail or in stedy PT Short Term Goal 4 (Week 1): Pt will propell WC >136ft with supevision assist PT Short Term Goal 5 (Week 1): Pt will maintain sittng balance EOB with mod assist up to 5 min to prepare for use of power WC/scooter.  Skilled Therapeutic Interventions/Progress Updates:   Pt received lying in bed asleep, wife present in room and assisted with waking up.  Pt did seem to wake easier than morning sessions.  Wife reporting that they have run some tests and everything has come back normal so far.  Urine did look cloudy, however pts wife reports urinalysis was normal.  Pt agreeable to sit at EOB.  Provided +2 A for supine to sit with assist needed for LEs out of bed, to elevate trunk however he did attempt to use LUE to sit up.  Once at EOB, requires varying assist from +2 to max A.  Performed sit<>stand with use of stedy x 2 reps.  Pt needs +2A for any standing attempts (4 total) any only getting fully erect during second rep.  Also continue to note that RLE tends to flex at knee under him even in stedy requiring PT to block during standing trials.  Transitioned to w/c while on stedy, however pt needing +2 to ensure getting into chair as RLE went completely under w/c.  Provided assist for scooting back into chair and placement of LEs onto leg rests.  Pt able to self propel w/c with BUEs with min A x 20' with cues for technique and posture.  Assisted remainder of distance to day room for time management.  Transferred via  slideboard to/from w/c to level therapy mat with +2A.  Pt needing near total A for trunk lean and placement of UEs and to ensure LEs remain in the correct position.  While seated on EOM, worked on midline orientation and L lateral weight shift with trunk elongation on L.  Utilized [employer] for [employer].  PT provided thoracic extension throughout weight shifting exercises with pt able to initiate erect posture.  Had pt perform L lateral lean reaching for rehab tech with sustained posture to encourage L trunk elongation.  Pt tolerated well, but was unable to actively get out of R side sit without assist.  Pt assisted back to w/c as above and assisted back to room.  Left pt in w/c to eat lunch, wife present in room to assist.   Note he continued to have anterior spillage of saliva during most of session, however this did improve when working on upright postures and weight shifts.    Therapy Documentation Precautions:  Precautions Precautions: Fall Precaution Comments: hx MS (R LE deficits > L LE, spastic diplegia) Restrictions Weight Bearing Restrictions: No   Pain: Pt reports mild pain in RLE with stretching, otherwise, no pain.     Therapy/Group: Individual Therapy  Denice Bors 12/11/2019, 8:48 AM

## 2019-12-11 NOTE — Progress Notes (Signed)
[place] PHYSICAL MEDICINE & REHABILITATION PROGRESS NOTE  Subjective/Complaints: Patient seen laying in bed this morning.  He states he slept well overnight.  Later informed by nursing regarding increasing lethargy.  ROS: Denies CP, SOB, N/V/D  Objective: Vital Signs: Blood pressure 109/71, pulse 82, temperature 97.6 F (36.4 C), temperature source Oral, resp. rate 19, height 6' (1.829 m), weight 91.7 kg, SpO2 97 %. No results found. Recent Labs    12/10/19 0607  WBC 7.6  HGB 12.3*  HCT 37.2*  PLT 240   Recent Labs    12/10/19 0607  NA 147*  K 4.4  CL 115*  CO2 23  GLUCOSE 61*  BUN 27*  CREATININE 0.65  CALCIUM 9.1    Physical Exam: BP 109/71 (BP Location: Right Arm)   Pulse 82   Temp 97.6 F (36.4 C) (Oral)   Resp 19   Ht 6' (1.829 m)   Wt 91.7 kg   SpO2 97%   BMI 27.42 kg/m  Constitutional: No distress . Vital signs reviewed. HENT: Normocephalic.  Atraumatic. Eyes: EOMI. No discharge. Cardiovascular: No JVD. Respiratory: Normal effort.  No stridor. GI: Non-distended. Skin: Warm and dry.  Intact on visible skin. Psych: Slowed. Musc: Lower extremity edema GU: + Foley Neuro: Alert Motor: Bilateral upper extremities: 4+ -5/5 proximal distal Right lower extremity: 1/5 proximal distal, unchanged Left lower extremity: Hip flexion, knee extension 1+/5, ankle dorsiflexion 1+/5, unchanged Dysarthria, unchanged Increased tone in bilateral lower extremities  Assessment/Plan: 1. Functional deficits secondary to debility which require 3+ hours per day of interdisciplinary therapy in a comprehensive inpatient rehab setting.  Physiatrist is providing close team supervision and 24 hour management of active medical problems listed below.  Physiatrist and rehab team continue to assess barriers to discharge/monitor patient progress toward functional and medical goals  Care Tool:  Bathing    Body parts bathed by patient: Chest   Body parts bathed by helper:  Right arm, Left arm, Abdomen, Front perineal area, Buttocks, Right upper leg, Left upper leg, Right lower leg, Left lower leg, Face     Bathing assist Assist Level: 2 Helpers     Upper Body Dressing/Undressing Upper body dressing   What is the patient wearing?: Pull over shirt    Upper body assist Assist Level: Total Assistance - Patient < 25%    Lower Body Dressing/Undressing Lower body dressing      What is the patient wearing?: Underwear/pull up, Pants     Lower body assist Assist for lower body dressing: 2 Helpers     Toileting Toileting    Toileting assist Assist for toileting: Dependent - Patient 0%     Transfers Chair/bed transfer  Transfers assist  Chair/bed transfer activity did not occur: Safety/medical concerns  Chair/bed transfer assist level: 2 Helpers     Locomotion Ambulation   Ambulation assist   Ambulation activity did not occur: Safety/medical concerns          Walk 10 feet activity   Assist  Walk 10 feet activity did not occur: Safety/medical concerns        Walk 50 feet activity   Assist Walk 50 feet with 2 turns activity did not occur: Safety/medical concerns         Walk 150 feet activity   Assist Walk 150 feet activity did not occur: Safety/medical concerns         Walk 10 feet on uneven surface  activity   Assist Walk 10 feet on uneven surfaces activity did  not occur: Safety/medical concerns         Wheelchair     Assist Will patient use wheelchair at discharge?: Yes Type of Wheelchair: Manual    Wheelchair assist level: Supervision/Verbal cueing Max wheelchair distance: 63ft    Wheelchair 50 feet with 2 turns activity    Assist        Assist Level: Supervision/Verbal cueing   Wheelchair 150 feet activity     Assist Wheelchair 150 feet activity did not occur: Safety/medical concerns          Medical Problem List and Plan: 1.  Decreased functional mobility secondary to  acute respiratory distress with hypoxic respiratory failure secondary to multi focal pneumonia in the setting of MS.    Follow-up chest x-ray 12/07/2019 bibasilar pulmonary infiltrates improved from prior exams.  Patient completed 10-day course of Augmentin on 12/17  Completed 5-day course of IV Solu-Medrol.    Continue CIR 2.  Antithrombotics: DVT/anticoagulation: Subcutaneous Lovenox.  CT angiogram of chest negative for pulmonary emboli             Antiplatelet therapy: N/A 3. Pain Management/spasticity: Baclofen 5 mg 3 times daily 4. Mood: Provide emotional support  Will request Neuropsych consult  Fluoxetine started on 12/18, increase as necessary             -antipsychotic agents: N/A 5. Neuropsych: This patient is not capable of making decisions on his own behalf. 6. Skin/Wound Care: Routine skin checks/pressure injury to buttocks.  Follow-up skin care checks 7. Fluids/Electrolytes/Nutrition: Routine in and outs 8.  Atrial fibrillation with RVR.  IV Cardizem discontinued due to hypotension.  No current plan for anticoagulation  Monitor with increased mobility. 9.  Neurogenic bowel bladder with recurrent UTIs.  Indwelling Foley catheter tube to remain in place at the recommendations of urology services per plan outpatient evaluation for possible need for suprapubic tube 10.  Dysphagia.  Dysphagia #1 nectar liquids.  Follow-up speech therapy.    Advance diet as tolerated. 11.AKI:   BUN remains elevated, but improving on 12/18, labs ordered for Monday  Cont IVF 12. Spasticity in setting of MS  Baclofen increased to home dose of 10 mg 3 times daily on 12/17, decreased back to 5 mg 3 times daily on 12/19 due to lethargy 13. Steroid induced hyperglycemia: ?Resolved  Cont to monitor 14.  Hypernatremia  Sodium 147 on 12/18, labs ordered for Monday  Cont to monitor 15. Hypoalbuminemia  Supplement initiated on 12/16 16.  Transaminitis  LFTs elevated, but improving on 12/18, labs  ordered for Monday  Continue to monitor 17. ABLA  Hb 12.3 on 12/18  Cont to monitor  LOS: 4 days A FACE TO FACE EVALUATION WAS PERFORMED  Josia Cueva Karis Juba 12/11/2019, 5:09 PM

## 2019-12-11 NOTE — Progress Notes (Signed)
Occupational Therapy Session Note  Patient Details  Name: Stephen Wade MRN: [id number] Date of Birth: 1951-10-19  Today's Date: 12/11/2019 OT Individual Time: 4098-1191 OT Individual Time Calculation (min): 54 min  and Today's Date: 12/11/2019 OT Missed Time: 21 Minutes Missed Time Reason: Patient fatigue(difficulty maintaining arousal)   Short Term Goals: Week 1:  OT Short Term Goal 1 (Week 1): Pt will don shirt with mod A OT Short Term Goal 2 (Week 1): Pt will maintain EOB sitting balance with mod A OT Short Term Goal 3 (Week 1): Pt will complete LB dressing with max A  Skilled Therapeutic Interventions/Progress Updates:    Treatment session with focus on bed mobility, sit > stand, and participation in self-care tasks.  Pt received supine in bed asleep.  Pt required increased time to fully arouse.  Pt agreeable to shower this session.  Doffed LB clothing at bed level to decrease burden of care, requiring max assist to roll and 2nd person to assist pt in maintaining sidelying while therapist doffed clothing.  Pt required max assist +2 for sidelying to sitting, demonstrating increased posterior lean this session.  Total assist +2 for sit > stand in [place] with pt demonstrating decreased anterior weight shift requiring increased assistance for mobility.  Planned to engage in bathing in shower, however due to increased lean, decreased arousal, and difficulty with sit <> stand shower deemed unsafe at this time. Therefore attempted bathing in semi-standing position in [place] with focus on washing UB at sink.  Attempted to utilize mirror for visual feedback to improve upright sitting posture/balance however pt with further lean, this time forward.  Returned to EOB, completed UB bathing with max assist and 2nd person to maintain sitting balance and then completed LB bathing total assist at bed level.  Pt with increased difficulty maintaining eyes open and arousal once returned to bed, therefore session  terminated.  Notified RN of increased fatigue and increased difficulty with mobility.  Therapy Documentation Precautions:  Precautions Precautions: Fall Precaution Comments: hx MS (R LE deficits > L LE, spastic diplegia) Restrictions Weight Bearing Restrictions: No General: General OT Amount of Missed Time: 21 Minutes Pain:  Pt with no c/o pain   Therapy/Group: Individual Therapy  Simonne Come 12/11/2019, 9:35 AM

## 2019-12-11 NOTE — Progress Notes (Signed)
Therapists reporting patient is sleepy and less able to participate in therapy than yesterday. Vital signs taken and WNL for patient. Reported to MD. Orders given to reduce baclofen dose.

## 2019-12-12 ENCOUNTER — Inpatient Hospital Stay (HOSPITAL_COMMUNITY): Payer: Medicare Other | Admitting: Speech Pathology

## 2019-12-12 DIAGNOSIS — R252 Cramp and spasm: Secondary | ICD-10-CM

## 2019-12-12 NOTE — Progress Notes (Signed)
[place] PHYSICAL MEDICINE & REHABILITATION PROGRESS NOTE  Subjective/Complaints: Patient seen sitting up in bed this morning.  He states he slept well overnight.  He states he feels weak.  ROS: Denies CP, SOB, N/V/D  Objective: Vital Signs: Blood pressure 119/65, pulse 78, temperature (!) 97.3 F (36.3 C), resp. rate 19, height 6' (1.829 m), weight 91.7 kg, SpO2 93 %. No results found. Recent Labs    12/10/19 0607  WBC 7.6  HGB 12.3*  HCT 37.2*  PLT 240   Recent Labs    12/10/19 0607  NA 147*  K 4.4  CL 115*  CO2 23  GLUCOSE 61*  BUN 27*  CREATININE 0.65  CALCIUM 9.1    Physical Exam: BP 119/65 (BP Location: Right Arm)   Pulse 78   Temp (!) 97.3 F (36.3 C)   Resp 19   Ht 6' (1.829 m)   Wt 91.7 kg   SpO2 93%   BMI 27.42 kg/m  Constitutional: No distress . Vital signs reviewed. HENT: Normocephalic.  Atraumatic. Eyes: EOMI. No discharge. Cardiovascular: No JVD. Respiratory: Normal effort.  No stridor. GI: Non-distended. Skin: Warm and dry.  Intact. Psych: Slowed. Musc: Lower extremity edema GU: + Foley Neuro: Alert Motor: Bilateral upper extremities: 4+-5/5 proximal distal Right lower extremity: 1/5 proximal distal, stable Left lower extremity: Hip flexion, knee extension 1+/5, ankle dorsiflexion 1+/5, stable Dysarthria, stable Increased tone in bilateral lower extremities  Assessment/Plan: 1. Functional deficits secondary to debility which require 3+ hours per day of interdisciplinary therapy in a comprehensive inpatient rehab setting.  Physiatrist is providing close team supervision and 24 hour management of active medical problems listed below.  Physiatrist and rehab team continue to assess barriers to discharge/monitor patient progress toward functional and medical goals  Care Tool:  Bathing    Body parts bathed by patient: Chest   Body parts bathed by helper: Right arm, Left arm, Abdomen, Front perineal area, Buttocks, Right upper leg,  Left upper leg, Right lower leg, Left lower leg, Face     Bathing assist Assist Level: 2 Helpers     Upper Body Dressing/Undressing Upper body dressing   What is the patient wearing?: Pull over shirt    Upper body assist Assist Level: Total Assistance - Patient < 25%    Lower Body Dressing/Undressing Lower body dressing      What is the patient wearing?: Underwear/pull up, Pants     Lower body assist Assist for lower body dressing: 2 Helpers     Toileting Toileting    Toileting assist Assist for toileting: Dependent - Patient 0%     Transfers Chair/bed transfer  Transfers assist  Chair/bed transfer activity did not occur: Safety/medical concerns  Chair/bed transfer assist level: 2 Helpers     Locomotion Ambulation   Ambulation assist   Ambulation activity did not occur: Safety/medical concerns          Walk 10 feet activity   Assist  Walk 10 feet activity did not occur: Safety/medical concerns        Walk 50 feet activity   Assist Walk 50 feet with 2 turns activity did not occur: Safety/medical concerns         Walk 150 feet activity   Assist Walk 150 feet activity did not occur: Safety/medical concerns         Walk 10 feet on uneven surface  activity   Assist Walk 10 feet on uneven surfaces activity did not occur: Safety/medical concerns  Wheelchair     Assist Will patient use wheelchair at discharge?: Yes Type of Wheelchair: Manual    Wheelchair assist level: Supervision/Verbal cueing Max wheelchair distance: 40ft    Wheelchair 50 feet with 2 turns activity    Assist        Assist Level: Supervision/Verbal cueing   Wheelchair 150 feet activity     Assist Wheelchair 150 feet activity did not occur: Safety/medical concerns          Medical Problem List and Plan: 1.  Decreased functional mobility secondary to acute respiratory distress with hypoxic respiratory failure secondary to multi  focal pneumonia in the setting of MS.    Follow-up chest x-ray 12/07/2019 bibasilar pulmonary infiltrates improved from prior exams.  Patient completed 10-day course of Augmentin on 12/17  Completed 5-day course of IV Solu-Medrol.    Continue CIR 2.  Antithrombotics: DVT/anticoagulation: Subcutaneous Lovenox.  CT angiogram of chest negative for pulmonary emboli             Antiplatelet therapy: N/A 3. Pain Management/spasticity: Baclofen 5 mg 3 times daily 4. Mood: Provide emotional support  Will request Neuropsych consult  Fluoxetine started on 12/18, will consider further increase this week             -antipsychotic agents: N/A 5. Neuropsych: This patient is not capable of making decisions on his own behalf. 6. Skin/Wound Care: Routine skin checks/pressure injury to buttocks.  Follow-up skin care checks 7. Fluids/Electrolytes/Nutrition: Routine in and outs 8.  Atrial fibrillation with RVR.  IV Cardizem discontinued due to hypotension.  No current plan for anticoagulation  Monitor with increased mobility. 9.  Neurogenic bowel bladder with recurrent UTIs.  Indwelling Foley catheter tube to remain in place at the recommendations of urology services per plan outpatient evaluation for possible need for suprapubic tube 10.  Dysphagia.  Dysphagia #1 nectar liquids.  Follow-up speech therapy.    Advance diet as tolerated. 11.AKI:   BUN remains elevated, but improving on 12/18, labs ordered for tomorrow  Cont IVF 12. Spasticity in setting of MS  Baclofen increased to home dose of 10 mg 3 times daily on 12/17, decreased back to 5 mg 3 times daily on 12/19 due to lethargy, tolerating 13. Steroid induced hyperglycemia: ?Resolved  Cont to monitor 14.  Hypernatremia  Sodium 147 on 12/18, labs ordered for tomorrow  Cont to monitor 15. Hypoalbuminemia  Supplement initiated on 12/16 16.  Transaminitis  LFTs elevated, but improving on 12/18, labs ordered for tomorrow  Continue to monitor 17.  ABLA  Hb 12.3 on 12/18  Cont to monitor  LOS: 5 days A FACE TO FACE EVALUATION WAS PERFORMED  Ankit Karis Juba 12/12/2019, 5:23 PM

## 2019-12-12 NOTE — Progress Notes (Signed)
Patient was more easily awakened Sunday than Saturday except was self-reporting some confusion and asking questions about where he was and what brought him here. Reoriented easily. Vital signs within normal, no new physical assessment findings. Became more alert and oriented through day as wife came to visit. Per wife's request, held prozac. Gave baclofen as ordered.

## 2019-12-13 ENCOUNTER — Inpatient Hospital Stay (HOSPITAL_COMMUNITY): Payer: Medicare Other | Admitting: Speech Pathology

## 2019-12-13 ENCOUNTER — Inpatient Hospital Stay (HOSPITAL_COMMUNITY): Payer: Medicare Other

## 2019-12-13 ENCOUNTER — Encounter (HOSPITAL_COMMUNITY): Payer: Medicare Other | Admitting: Psychology

## 2019-12-13 ENCOUNTER — Inpatient Hospital Stay (HOSPITAL_COMMUNITY): Payer: Medicare Other | Admitting: Occupational Therapy

## 2019-12-13 DIAGNOSIS — F331 Major depressive disorder, recurrent, moderate: Secondary | ICD-10-CM

## 2019-12-13 LAB — CBC
HCT: 42.4 % (ref 39.0–52.0)
Hemoglobin: 13.9 g/dL (ref 13.0–17.0)
MCH: 31.1 pg (ref 26.0–34.0)
MCHC: 32.8 g/dL (ref 30.0–36.0)
MCV: 94.9 fL (ref 80.0–100.0)
Platelets: 251 10*3/uL (ref 150–400)
RBC: 4.47 MIL/uL (ref 4.22–5.81)
RDW: 16.4 % — ABNORMAL HIGH (ref 11.5–15.5)
WBC: 9 10*3/uL (ref 4.0–10.5)
nRBC: 0 % (ref 0.0–0.2)

## 2019-12-13 LAB — COMPREHENSIVE METABOLIC PANEL
ALT: 98 U/L — ABNORMAL HIGH (ref 0–44)
AST: 32 U/L (ref 15–41)
Albumin: 3 g/dL — ABNORMAL LOW (ref 3.5–5.0)
Alkaline Phosphatase: 119 U/L (ref 38–126)
Anion gap: 8 (ref 5–15)
BUN: 16 mg/dL (ref 8–23)
CO2: 25 mmol/L (ref 22–32)
Calcium: 9.1 mg/dL (ref 8.9–10.3)
Chloride: 108 mmol/L (ref 98–111)
Creatinine, Ser: 0.75 mg/dL (ref 0.61–1.24)
GFR calc Af Amer: >60 mL/min (ref >60)
GFR calc non Af Amer: >60 mL/min (ref >60)
Glucose, Bld: 95 mg/dL (ref 70–99)
Potassium: 4.4 mmol/L (ref 3.5–5.1)
Sodium: 141 mmol/L (ref 135–145)
Total Bilirubin: 1.3 mg/dL — ABNORMAL HIGH (ref 0.3–1.2)
Total Protein: 6.1 g/dL — ABNORMAL LOW (ref 6.5–8.1)

## 2019-12-13 LAB — URINALYSIS, ROUTINE W REFLEX MICROSCOPIC
Bilirubin Urine: NEGATIVE
Glucose, UA: NEGATIVE mg/dL
Ketones, ur: NEGATIVE mg/dL
Nitrite: NEGATIVE
Protein, ur: 100 mg/dL — AB
Specific Gravity, Urine: 1.011 (ref 1.005–1.030)
WBC, UA: >50 WBC/hpf — ABNORMAL HIGH (ref 0–5)
pH: 8 (ref 5.0–8.0)

## 2019-12-13 LAB — AMMONIA: Ammonia: 25 umol/L (ref 9–35)

## 2019-12-13 LAB — LACTIC ACID, PLASMA: Lactic Acid, Venous: 0.8 mmol/L (ref 0.5–1.9)

## 2019-12-13 MED ORDER — TRAZODONE HCL 50 MG PO TABS
25.0000 mg | ORAL_TABLET | Freq: Every evening | ORAL | Status: DC | PRN
  Filled 2019-12-13: qty 1

## 2019-12-13 NOTE — Plan of Care (Signed)
  Problem: Consults Goal: RH GENERAL PATIENT EDUCATION Description: See Patient Education module for education specifics. Outcome: Progressing   Problem: RH BOWEL ELIMINATION Goal: RH STG MANAGE BOWEL WITH ASSISTANCE Description: STG Manage Bowel with Min Assistance. Outcome: Progressing Goal: RH STG MANAGE BOWEL W/MEDICATION W/ASSISTANCE Description: STG Manage Bowel with Medication with [place]. Outcome: Progressing   Problem: RH BLADDER ELIMINATION Goal: RH STG MANAGE BLADDER WITH ASSISTANCE Description: STG Manage Bladder With min Assistance Outcome: Progressing   Problem: RH SKIN INTEGRITY Goal: RH STG SKIN FREE OF INFECTION/BREAKDOWN Description: Min assist Outcome: Progressing Goal: RH STG MAINTAIN SKIN INTEGRITY WITH ASSISTANCE Description: STG Maintain Skin Integrity With [place]. Outcome: Progressing   Problem: RH SAFETY Goal: RH STG ADHERE TO SAFETY PRECAUTIONS W/ASSISTANCE/DEVICE Description: STG Adhere to Safety Precautions With Min Assistance/Device. Outcome: Progressing Goal: RH STG DECREASED RISK OF FALL WITH ASSISTANCE Description: STG Decreased Risk of Fall With [employer]. Outcome: Progressing

## 2019-12-13 NOTE — Consult Note (Signed)
Neuropsychological Consultation   Patient:   Stephen Wade   DOB:   11/30/1951  MR Number:  [id number]  Location:  [facility] [address] CENTER A [address] [id number] [place] [address] Dept: [phone] Loc: [phone]           Date of Service:   12/13/2019  Start Time:   9 AM End Time:   10 AM  Provider/Observer:  Arley Phenix, Psy.D.       Clinical Neuropsychologist       Billing Code/Service: 16010  Chief Complaint:    Stephen Wade is a 68 year old male with history of MS.  Presented 11/27/2019 with increased weakness decreased functional mobility.  CT/MRI no new intracranial abnormality and stable since 2018 with changes consistent with chronic demyelination.  Pneumonia developed but COVID 19 negative.  Patient with debility, history of depression.  Reason for Service:  The patient was referred for neuropsychological consultation due to coping and adjustment issues with a history of depression.  Below is the HPI for the current admission.  HPI: Stephen Wade is a 68 year old right-handed male with history of multiple sclerosis followed by [name] maintained on Ocrevus 300 mg every six months.  Recurrent lower urinary tract symptoms/self caths followed by [name], remote tobacco abuse.  Per chart review patient lives with spouse.  Two-level home with bed and bath main level with ramped entrance.  He did have a home health aide for some ADLs however aide was recently COVID-19 positive.  Patient primarily used a power scooter prior to admission he was able to do scoot pivot transfers.  He has been essentially nonambulatory for 8 years.  Presented 11/27/2019 with increased weakness decreased functional mobility.   Cranial CT/MRI no new intracranial abnormality stable since 2018 with changes compatible with chronic demyelination.  Chest x-ray did show infiltrate right base representing pneumonia versus  aspiration.  CT angiogram of the chest negative for pulmonary emboli.  Patient was placed on a 5-day course of IV Solu-Medrol for pseudoflare of multiple sclerosis.  Admission chemistries hemoglobin 13.8/hematocrit 42.1, sodium 146, BUN 30, SARS coronavirus negative, urine culture greater 100,000 group B strep ( S.Agalactiae).  EKG showed atrial fibrillation RVR heart rates 120s to 140s he was started on IV Cardizem initially did require nonrebreather mask.  He was transferred to stepdown telemetry 12/01/2019.  His Cardizem was later discontinued due to bouts of hypotension no current plan for anticoagulation at this time.  Initially maintained on IV Rocephin transition to vancomycin and Zosyn.  MRSA negative and Zosyn was changed to Augmentin completing a 10-day course.  A follow-up chest x-ray was completed 12/07/2019 showed bibasilar pulmonary infiltrates improved from prior exams.  In regards to patient's recurrent UTIs now with urinary tract infection a Foley catheter tube was placed plan was to follow-up outpatient urology services for consideration of suprapubic catheter.FOLEY TUBE IS not to be removed.  Subcutaneous Lovenox for DVT prophylaxis.  Noted pressure injury blistered area mid bilateral buttocks with generalized wound care.  Palliative care was consulted to establish goals of care.  Patient is currently on dysphagia #1 nectar thick liquid diet and close monitoring of hydration with lattest BUN 34 up from 31-27-21  and received a fluid bolus 12/07/2019.  Therapy evaluations completed and patient was admitted for a comprehensive rehab program.  Current Status:  The patient was extremely fatigued and weak today and had difficulty speaking and staying awake.  We worked  on issues related to his adjustment the best we could but the patient was then able to do a great deal of interaction or speaking today.  Behavioral Observation: Stephen Wade  presents as a 77 y.o.-year-old Right Caucasian Male who  appeared his stated age. his dress was Appropriate and he was Well Groomed and his manners were Appropriate to the situation.  his participation was indicative of Drowsy and Inattentive behaviors.  There were any physical disabilities noted.  he displayed an appropriate level of cooperation and motivation.     Interactions:    Minimal Drowsy and Inattentive  Attention:   abnormal and attention span appeared shorter than expected for age  Memory:   abnormal; global memory impairment noted  Visuo-spatial:  not examined  Speech (Volume):  low  Speech:   slurred; garbled  Thought Process:  Circumstantial  Though Content:  WNL; not suicidal and not homicidal  Orientation:   person and place  Judgment:   Poor  Planning:   Poor  Affect:    Lethargic  Mood:    Dysphoric  Insight:   Fair  Intelligence:   normal  Medical History:   Past Medical History:  Diagnosis Date  . GERD (gastroesophageal reflux disease)    had on- off chest pain, symptoms resolved with Prilosec 11/2010  . Lower urinary tract symptoms (LUTS)    Dr Gaynelle Arabian  . MS (multiple sclerosis) ([place]) 2005  . Testicular hypogonadism   . Urge incontinence of urine   . Urolithiasis 2011    Psychiatric History:  Past history of depression  Family Med/Psych History:  Family History  Problem Relation Age of Onset  . Heart attack Mother   . Brain cancer Father   . Colon cancer Neg Hx   . Prostate cancer Neg Hx     Impression/DX:  Stephen Wade is a 68 year old male with history of MS.  Presented 11/27/2019 with increased weakness decreased functional mobility.  CT/MRI no new intracranial abnormality and stable since 2018 with changes consistent with chronic demyelination.  Pneumonia developed but COVID 19 negative.  Patient with debility, history of depression.  The patient was extremely fatigued and weak today and had difficulty speaking and staying awake.  We worked on issues related to his adjustment the best  we could but the patient was then able to do a great deal of interaction or speaking today.   Diagnosis:    Debility        Electronically Signed   _______________________ Ilean Skill, Psy.D.

## 2019-12-13 NOTE — Progress Notes (Signed)
Occupational Therapy Note  Patient Details  Name: Stephen Wade MRN: [id number] Date of Birth: 10/30/1951  Today's Date: 12/13/2019 OT Missed Time: 55 Minutes Missed Time Reason: Patient fatigue;X-Ray  Pt missed 60 mins scheduled OT treatment session due to initially off floor for x-ray and then upon return nursing care - being assessed for possible sepsis.  Pt reporting "I feel so weak".  Pt wife present, therefore this therapist introduced herself as primary OT and status from [place] and Sat.  Will continue to follow per POC.   Simonne Come 12/13/2019, 12:13 PM

## 2019-12-13 NOTE — Significant Event (Signed)
Rapid Response Event Note  Overview: Time Called: 1006 Arrival Time: 1020 Event Type: Other (Comment)(Second set of eyes)  New onset lethargy.  Initial Focused Assessment: Patient lying in bed, appears to be asleep. Patient opens eyes to verbal stimuli. AO x4. Responds to questions in 1-2 word responses. Implies he does not feel well. VS unchanged and WDL. Pt urine appears cloudy with sediment in foley tubing. Pt has no complaints of pain.  Interventions: Primary RN to notify attending MD and request orders for the following interventions:  -CXR, CBC, La, urinalysis or urine culture, ammonia   Plan of Care (if not transferred): RN to continue to monitor patient's mentation and arousability. RN to update attending physician in regards to results of ordered labs and test. RN to call rapid response for further needs.  Event Summary: Orders were placed by Dr. Donia Guiles at 1037.  Stephen Wade

## 2019-12-13 NOTE — Progress Notes (Signed)
Speech Language Pathology Daily Session Note  Patient Details  Name: BREYLON SHERROW MRN: [id number] Date of Birth: [birth year]  Today's Date: 12/13/2019 SLP Individual Time: 6734-1937 SLP Individual Time Calculation (min): 44 min  Short Term Goals: Week 1: SLP Short Term Goal 1 (Week 1): Pt will consume Dys 1/nectar diet with Min overt s/sx aspiration and efficient mastication and oral clearance with Min A verbal cues for use of swallow strtaegies. SLP Short Term Goal 2 (Week 1): Pt will consume trials of thin H2O via teaspoon with Min A verbal cues for use of swallow strategies and Min overt s/sx aspiration. SLP Short Term Goal 3 (Week 1): Pt will demonstrate recall of new/day to day information with Mod A verbal/visual cues for use of compensatory strategies and/or external aids. SLP Short Term Goal 4 (Week 1): Pt will demonstrate ability to problem solve during functional mildly complex tasks with Mod A verbal/visual cues. SLP Short Term Goal 5 (Week 1): Pt will use speech intelligibility strategie with Min A verbal cues to achieve 80% intelligibility at the sentence level.  Skilled Therapeutic Interventions: Pt was seen for skilled ST targeting dysphagia and speech goals. SLP facilitated session with Max A for completion of oral care via suction. Pt's wife was present and interested in learning how to complete oral care, therefore demonstration and verbal explanation provided. Pt accepted 3 teaspoons of thin water with 1 immediate cough observed when pt did not successfully perform chin tuck prior to swallow initiation - he required Mod A verbal cues in order to use chin tuck during trials. Pt's wife reported concern regarding pt's limited PO intake, which SLP acknowledged and provided rationale behind current Dys 1/nectar recommendation and encouraged pt to continue to work toward increasing intake. Overall Max A verbal cues and models were required for pt to implement increased vocal intensity  and overarticulate during a phrase level barrier speech task. Pt was ~55-60% intelligible throughout task. He continues to report perceived significant weakness and fatigue, and presented with wet vocal quality and decreased secretion management throughout session, which also negatively impacted speech intelligibility today. Pt left laying in bed with alarm set, needs within reach, wife still present. Continue per current plan of care.        Pain Pain Assessment Pain Scale: Faces Faces Pain Scale: No hurt  Therapy/Group: Individual Therapy  Arbutus Leas 12/13/2019, 2:41 PM

## 2019-12-13 NOTE — Progress Notes (Signed)
Physical Therapy Session Note  Patient Details  Name: Stephen Wade MRN: [id number] Date of Birth: 05/02/1951  Today's Date: 12/13/2019 PT Missed Time:  - 45 minutes     Short Term Goals: Week 1:  PT Short Term Goal 1 (Week 1): Pt will perform bed mobility with mod assist PT Short Term Goal 2 (Week 1): Pt will transfer to [facility] with mod assist PT Short Term Goal 3 (Week 1): Pt will perform sit<>stand with mod assist and BUE support on rail or in stedy PT Short Term Goal 4 (Week 1): Pt will propell WC >163ft with supevision assist PT Short Term Goal 5 (Week 1): Pt will maintain sittng balance EOB with mod assist up to 5 min to prepare for use of power WC/scooter.  Skilled Therapeutic Interventions/Progress Updates:   Received pt supine in bed asleep, pt denied any pain, but stated he was too tired to participate in therapy this afternoon. Pt off floor earlier for x-ray and being assessed for potential sepsis. Will continue to follow up per POC. 45 minutes of missed skilled physical therapy.   Therapy Documentation Precautions:  Precautions Precautions: Fall Precaution Comments: hx MS (R LE deficits > L LE, spastic diplegia) Restrictions Weight Bearing Restrictions: No   Therapy/Group: Individual Therapy Alfonse Alpers PT, DPT   12/13/2019, 7:57 AM

## 2019-12-13 NOTE — Progress Notes (Signed)
Speech Language Pathology Daily Session Note  Patient Details  Name: Stephen Wade MRN: [id number] Date of Birth: 06-13-1951  Today's Date: 12/13/2019 SLP Individual Time: 0730-0830 SLP Individual Time Calculation (min): 60 min  Short Term Goals: Week 1: SLP Short Term Goal 1 (Week 1): Pt will consume Dys 1/nectar diet with Min overt s/sx aspiration and efficient mastication and oral clearance with Min A verbal cues for use of swallow strtaegies. SLP Short Term Goal 2 (Week 1): Pt will consume trials of thin H2O via teaspoon with Min A verbal cues for use of swallow strategies and Min overt s/sx aspiration. SLP Short Term Goal 3 (Week 1): Pt will demonstrate recall of new/day to day information with Mod A verbal/visual cues for use of compensatory strategies and/or external aids. SLP Short Term Goal 4 (Week 1): Pt will demonstrate ability to problem solve during functional mildly complex tasks with Mod A verbal/visual cues. SLP Short Term Goal 5 (Week 1): Pt will use speech intelligibility strategie with Min A verbal cues to achieve 80% intelligibility at the sentence level.  Skilled Therapeutic Interventions: Pt was seen for skilled ST targeting dysphagia and cognitive goals. Pt was awake but visibly fatigued upon therapist's arrival. Once repositioned to sitting upright in bed for safe PO intake, pt accepted ~4oz nectar juice with immediate cough X2, puree cream of wheat consumed without overt s/sx aspiration and efficient oral clearance. Pt continues to present with difficulty managing secretions, as right anterior spillage of saliva noted requiring Mod A verbal cues for pt to use washcloth and/or suction for better management. Pt also required Mod A verbal cues for volitional cough and throat clear X5 to clear wet vocal quality throughout session, and attempts were only successful in 2 out of 5 attempts. Max A verbal, visual, and tactile assist was required for basic problem solving in order for  pt to use suction. Due to increased weakness, pt unable to complete [facility] exercises set at previously established 15 cm H2O pressure level; therefore, SLP adjusted to 12 cm H2O and pt performed 5 successful reps of exercise with assistance holding device and Mod A verbal and visual cues for accuracy. Pt required Mod A verbal cues to attend to sign posted in room to recall recommendation to perform 3 sets of 5 exercises, 2x daily. SLP further facilitated session with Mod A verbal cues and a visual aid for recall of instructions and Min A verbal/visual cues for problem solving during a novel semi-complex card task (blink). Pt appeared to be with increased confusion regarding reason for hospitalization today; he frequently stated "I had a stroke" and "I'm so weak from the stroke" throughout session. Per chart review and discussion with MD (who arrived during session), no evidence of pt having acute stroke - therefore, SLP provided verbal cues for reorientation to situation (MS exacerbation and impact of MS and physical activity on pt's perceived feeling of weakness.) Pt was independently oriented to place. Pt left laying in bed with alarm set and all needs within reach. Continue per current plan of care.       Pain Pain Assessment Pain Scale: Faces Pain Score: 0-No pain Faces Pain Scale: No hurt  Therapy/Group: Individual Therapy  Arbutus Leas 12/13/2019, 9:29 AM

## 2019-12-13 NOTE — Progress Notes (Signed)
Patient reported "I feel like I am dying", asked what was wrong and he was non specific. Vitals are stable, labs within normal limits. Patient doesn't look good, he is lethargic, pale and diaphoretic. Temperature was 98.2. RRT requested to do drive by. Non specific diagnosis, suggested work up for sepsis. [place] notified, and new orders received. Explained information to patient.  [place]

## 2019-12-13 NOTE — Progress Notes (Signed)
[place] PHYSICAL MEDICINE & REHABILITATION PROGRESS NOTE  Subjective/Complaints: Patient seen sitting up in bed this morning.  He states he slept well overnight.  He states he feels weak. Asks whether he had a stroke.  Stable labs today.   ROS: Denies CP, SOB, N/V/D  Objective: Vital Signs: Blood pressure (!) 141/79, pulse 94, temperature 98.5 F (36.9 C), resp. rate 18, height 6' (1.829 m), weight 91.7 kg, SpO2 96 %. No results found. No results for input(s): WBC, HGB, HCT, PLT in the last 72 hours. Recent Labs    12/13/19 0610  NA 141  K 4.4  CL 108  CO2 25  GLUCOSE 95  BUN 16  CREATININE 0.75  CALCIUM 9.1    Physical Exam: BP (!) 141/79 (BP Location: Right Arm)   Pulse 94   Temp 98.5 F (36.9 C)   Resp 18   Ht 6' (1.829 m)   Wt 91.7 kg   SpO2 96%   BMI 27.42 kg/m  Constitutional: No distress . Vital signs reviewed. HENT: Normocephalic.  Atraumatic. Eyes: EOMI. No discharge. Cardiovascular: No JVD. Respiratory: Normal effort.  No stridor. GI: Non-distended. Skin: Warm and dry.  Intact. Psych: Slowed. Musc: Lower extremity edema GU: + Foley Neuro: Alert Motor: Bilateral upper extremities: 4+-5/5 proximal distal Right lower extremity: 1/5 proximal distal, stable Left lower extremity: Hip flexion, knee extension 1+/5, ankle dorsiflexion 1+/5, stable Dysarthria, stable Increased tone in bilateral lower extremities  Assessment/Plan: 1. Functional deficits secondary to debility which require 3+ hours per day of interdisciplinary therapy in a comprehensive inpatient rehab setting.  Physiatrist is providing close team supervision and 24 hour management of active medical problems listed below.  Physiatrist and rehab team continue to assess barriers to discharge/monitor patient progress toward functional and medical goals  Care Tool:  Bathing    Body parts bathed by patient: Chest   Body parts bathed by helper: Right arm, Left arm, Abdomen, Front  perineal area, Buttocks, Right upper leg, Left upper leg, Right lower leg, Left lower leg, Face     Bathing assist Assist Level: 2 Helpers     Upper Body Dressing/Undressing Upper body dressing   What is the patient wearing?: Pull over shirt    Upper body assist Assist Level: Total Assistance - Patient < 25%    Lower Body Dressing/Undressing Lower body dressing      What is the patient wearing?: Underwear/pull up, Pants     Lower body assist Assist for lower body dressing: 2 Helpers     Toileting Toileting    Toileting assist Assist for toileting: Dependent - Patient 0%     Transfers Chair/bed transfer  Transfers assist  Chair/bed transfer activity did not occur: Safety/medical concerns  Chair/bed transfer assist level: 2 Helpers     Locomotion Ambulation   Ambulation assist   Ambulation activity did not occur: Safety/medical concerns          Walk 10 feet activity   Assist  Walk 10 feet activity did not occur: Safety/medical concerns        Walk 50 feet activity   Assist Walk 50 feet with 2 turns activity did not occur: Safety/medical concerns         Walk 150 feet activity   Assist Walk 150 feet activity did not occur: Safety/medical concerns         Walk 10 feet on uneven surface  activity   Assist Walk 10 feet on uneven surfaces activity did not occur: Safety/medical concerns  Wheelchair     Assist Will patient use wheelchair at discharge?: Yes Type of Wheelchair: Manual    Wheelchair assist level: Supervision/Verbal cueing Max wheelchair distance: 27ft    Wheelchair 50 feet with 2 turns activity    Assist        Assist Level: Supervision/Verbal cueing   Wheelchair 150 feet activity     Assist Wheelchair 150 feet activity did not occur: Safety/medical concerns          Medical Problem List and Plan: 1.  Decreased functional mobility secondary to acute respiratory distress with  hypoxic respiratory failure secondary to multi focal pneumonia in the setting of MS.    Follow-up chest x-ray 12/07/2019 bibasilar pulmonary infiltrates improved from prior exams.  Patient completed 10-day course of Augmentin on 12/17  Completed 5-day course of IV Solu-Medrol.    Continue CIR 2.  Antithrombotics: DVT/anticoagulation: Subcutaneous Lovenox.  CT angiogram of chest negative for pulmonary emboli             Antiplatelet therapy: N/A 3. Pain Management/spasticity: Baclofen 5 mg 3 times daily 4. Mood: Provide emotional support  Will request Neuropsych consult  Fluoxetine started on 12/18, will consider further increase this week             -antipsychotic agents: N/A 5. Neuropsych: This patient is not capable of making decisions on his own behalf. 6. Skin/Wound Care: Routine skin checks/pressure injury to buttocks.  Follow-up skin care checks 7. Fluids/Electrolytes/Nutrition: Routine in and outs 8.  Atrial fibrillation with RVR.  IV Cardizem discontinued due to hypotension.  No current plan for anticoagulation  Monitor with increased mobility. 9.  Neurogenic bowel bladder with recurrent UTIs.  Indwelling Foley catheter tube to remain in place at the recommendations of urology services per plan outpatient evaluation for possible need for suprapubic tube 10.  Dysphagia.  Dysphagia #1 nectar liquids.  Follow-up speech therapy.    Advance diet as tolerated. 11.AKI:   BUN remains elevated, but improving on 12/18, stable this morning.   Cont IVF 12. Spasticity in setting of MS  Baclofen increased to home dose of 10 mg 3 times daily on 12/17, decreased back to 5 mg 3 times daily on 12/19 due to lethargy, tolerating 13. Steroid induced hyperglycemia: ?Resolved  Cont to monitor 14.  Hypernatremia  Sodium 147 on 12/18, stable this morning.   Cont to monitor 15. Hypoalbuminemia  Supplement initiated on 12/16 16.  Transaminitis  LFTs elevated, but improving on 12/18, labs ordered for  tomorrow  Continue to monitor 17. ABLA  Hb 12.3 on 12/18  Cont to monitor 18. Provided education regarding his diagnosis and the possible etiologies of his fatigue.   LOS: 6 days A FACE TO FACE EVALUATION WAS PERFORMED  Icis Budreau P Naiyah Klostermann 12/13/2019, 9:15 AM

## 2019-12-14 ENCOUNTER — Inpatient Hospital Stay (HOSPITAL_COMMUNITY): Payer: Medicare Other | Admitting: Speech Pathology

## 2019-12-14 ENCOUNTER — Inpatient Hospital Stay (HOSPITAL_COMMUNITY): Payer: Medicare Other

## 2019-12-14 ENCOUNTER — Inpatient Hospital Stay (HOSPITAL_COMMUNITY): Payer: Medicare Other | Admitting: Occupational Therapy

## 2019-12-14 DIAGNOSIS — N319 Neuromuscular dysfunction of bladder, unspecified: Secondary | ICD-10-CM

## 2019-12-14 LAB — CREATININE, SERUM
Creatinine, Ser: 1.23 mg/dL (ref 0.61–1.24)
GFR calc Af Amer: >60 mL/min (ref >60)
GFR calc non Af Amer: >60 mL/min (ref >60)

## 2019-12-14 NOTE — Progress Notes (Signed)
[place] PHYSICAL MEDICINE & REHABILITATION PROGRESS NOTE  Subjective/Complaints: Patient seen sitting up in bed this morning.  He states he slept well overnight.  He denies complaints.  No reported issues overnight  ROS: Denies CP, SOB, N/V/D  Objective: Vital Signs: Blood pressure 130/75, pulse 91, temperature 98 F (36.7 C), resp. rate 18, height 6' (1.829 m), weight 91.7 kg, SpO2 92 %. DG Chest 2 View  Result Date: 12/13/2019 CLINICAL DATA:  Sepsis. EXAM: CHEST - 2 VIEW COMPARISON:  12/07/2019 FINDINGS: The cardiac silhouette, mediastinal and hilar contours are within normal limits and stable. Low lung volumes with vascular crowding and streaky bibasilar atelectasis. Persistent patchy left basilar density could reflect persistent infiltrate. No pleural effusions. IMPRESSION: Low lung volumes with vascular crowding and streaky bibasilar atelectasis. Persistent right basilar density, likely persistent infiltrate. Electronically Signed   By: Marijo Sanes M.D.   On: 12/13/2019 11:41   Recent Labs    12/13/19 1203  WBC 9.0  HGB 13.9  HCT 42.4  PLT 251   Recent Labs    12/13/19 0610 12/14/19 0538  NA 141  --   K 4.4  --   CL 108  --   CO2 25  --   GLUCOSE 95  --   BUN 16  --   CREATININE 0.75 1.23  CALCIUM 9.1  --     Physical Exam: BP 130/75 (BP Location: Right Arm)   Pulse 91   Temp 98 F (36.7 C)   Resp 18   Ht 6' (1.829 m)   Wt 91.7 kg   SpO2 92%   BMI 27.42 kg/m  Constitutional: No distress . Vital signs reviewed. HENT: Normocephalic.  Atraumatic. Eyes: EOMI. No discharge. Cardiovascular: No JVD. Respiratory: Normal effort.  No stridor. GI: Non-distended. Skin: Warm and dry.  Intact. Psych: Slowed.  Flat. Musc: Lower extremity edema GU: + Foley  Neuro: Alert Motor: Bilateral upper extremities: 4+-5/5 proximal distal Right lower extremity: 1/5 proximal distal, unchanged Left lower extremity: Hip flexion, knee extension 1+/5, ankle dorsiflexion 1+/5,  unchanged Dysarthria, stable Increased tone in bilateral lower extremities, unchanged  Assessment/Plan: 1. Functional deficits secondary to debility which require 3+ hours per day of interdisciplinary therapy in a comprehensive inpatient rehab setting.  Physiatrist is providing close team supervision and 24 hour management of active medical problems listed below.  Physiatrist and rehab team continue to assess barriers to discharge/monitor patient progress toward functional and medical goals  Care Tool:  Bathing    Body parts bathed by patient: Chest   Body parts bathed by helper: Right arm, Left arm, Abdomen, Front perineal area, Buttocks, Right upper leg, Left upper leg, Right lower leg, Left lower leg, Face     Bathing assist Assist Level: 2 Helpers     Upper Body Dressing/Undressing Upper body dressing   What is the patient wearing?: Pull over shirt    Upper body assist Assist Level: Total Assistance - Patient < 25%    Lower Body Dressing/Undressing Lower body dressing      What is the patient wearing?: Underwear/pull up, Pants     Lower body assist Assist for lower body dressing: 2 Helpers     Toileting Toileting    Toileting assist Assist for toileting: Dependent - Patient 0%     Transfers Chair/bed transfer  Transfers assist  Chair/bed transfer activity did not occur: Safety/medical concerns  Chair/bed transfer assist level: 2 Helpers     Locomotion Ambulation   Ambulation assist   Ambulation  activity did not occur: Safety/medical concerns          Walk 10 feet activity   Assist  Walk 10 feet activity did not occur: Safety/medical concerns        Walk 50 feet activity   Assist Walk 50 feet with 2 turns activity did not occur: Safety/medical concerns         Walk 150 feet activity   Assist Walk 150 feet activity did not occur: Safety/medical concerns         Walk 10 feet on uneven surface  activity   Assist Walk 10  feet on uneven surfaces activity did not occur: Safety/medical concerns         Wheelchair     Assist Will patient use wheelchair at discharge?: Yes Type of Wheelchair: Manual    Wheelchair assist level: Supervision/Verbal cueing Max wheelchair distance: 57ft    Wheelchair 50 feet with 2 turns activity    Assist        Assist Level: Supervision/Verbal cueing   Wheelchair 150 feet activity     Assist Wheelchair 150 feet activity did not occur: Safety/medical concerns          Medical Problem List and Plan: 1.  Decreased functional mobility secondary to acute respiratory distress with hypoxic respiratory failure secondary to multi focal pneumonia in the setting of MS.    Follow-up chest x-ray 12/07/2019 bibasilar pulmonary infiltrates improved from prior exams, repeat chest x-ray on 12/21 personally reviewed showing?  Persistence of infiltrate-will discuss with pulmonary need to treat given findings  Patient completed 10-day course of Augmentin on 12/17  Completed 5-day course of IV Solu-Medrol.    Continue CIR 2.  Antithrombotics: DVT/anticoagulation: Subcutaneous Lovenox.  CT angiogram of chest negative for pulmonary emboli             Antiplatelet therapy: N/A 3. Pain Management/spasticity: Baclofen 5 mg 3 times daily 4. Mood: Provide emotional support  Appreciate neuropsych eval  Fluoxetine started on 12/18, will consider further increase this week             -antipsychotic agents: N/A 5. Neuropsych: This patient is not capable of making decisions on his own behalf. 6. Skin/Wound Care: Routine skin checks/pressure injury to buttocks.  Follow-up skin care checks 7. Fluids/Electrolytes/Nutrition: Routine in and outs 8.  Atrial fibrillation with RVR.  IV Cardizem discontinued due to hypotension.  No current plan for anticoagulation  Monitor with increased mobility. 9.  Neurogenic bowel bladder with recurrent UTIs.  Indwelling Foley catheter tube to remain  in place at the recommendations of urology services per plan outpatient evaluation for possible need for suprapubic tube  UA on 12/21 suggestive of infection, urine culture pending 10.  Dysphagia.  Dysphagia #1 nectar liquids.  Follow-up speech therapy.    Advance diet as tolerated. 11.AKI:   Creatinine relatively elevated on 12/22, however within normal limits thus far,?  Erroneous, will repeat labs later this week  Cont IVF 12. Spasticity in setting of MS  Baclofen increased to home dose of 10 mg 3 times daily on 12/17, decreased back to 5 mg 3 times daily on 12/19 due to lethargy, tolerating 13. Steroid induced hyperglycemia: ?Resolved  Cont to monitor 14.  Hypernatremia  Sodium 141 on 12/21  Cont to monitor 15. Hypoalbuminemia  Supplement initiated on 12/16 16.  Transaminitis  LFTs elevated, but improving on 12/21  Continue to monitor 17. ABLA: Resolved  Hb 13.9 on 12/21  Cont to monitor  LOS: 7 days A FACE TO FACE EVALUATION WAS PERFORMED  Jaelen Gellerman Karis Juba 12/14/2019, 8:45 AM

## 2019-12-14 NOTE — Progress Notes (Signed)
Occupational Therapy Weekly Progress Note  Patient Details  Name: Stephen Wade MRN: [id number] Date of Birth: 1951/04/26  Beginning of progress report period: December 08, 2019 End of progress report period: December 14, 2019  Today's Date: 12/14/2019 OT Individual Time: 0950-1030 OT Individual Time Calculation (min): 40 min  and Today's Date: 12/14/2019 OT Missed Time: 35 Minutes Missed Time Reason: Patient fatigue;Pain   Patient has met 0 of 3 short term goals.  Pt is making minimal progress towards goals, due to decreased arousal and increased confusion.  Pt is demonstrating increased tone in BLE making transfers more challenging.  Pt unable to tolerate activity OOB due to extreme fatigue with any mobility.  Pt continues to state "I'm so weak" and "I can't do anything".  Pt continues to require max-total +2 for all bed mobility, self-care tasks, and transfers.  MD aware of concerns and medical work up is in progress.  Patient continues to demonstrate the following deficits: muscle weakness, decreased cardiorespiratoy endurance, decreased visual motor skills, decreased safety awareness and decreased memory and decreased sitting balance, decreased standing balance, decreased postural control and decreased balance strategies and therefore will continue to benefit from skilled OT intervention to enhance overall performance with BADL and Reduce care partner burden.  Patient progressing toward long term goals..  Continue plan of care.  OT Short Term Goals Week 1:  OT Short Term Goal 1 (Week 1): Pt will don shirt with mod A OT Short Term Goal 1 - Progress (Week 1): Not met OT Short Term Goal 2 (Week 1): Pt will maintain EOB sitting balance with mod A OT Short Term Goal 2 - Progress (Week 1): Not met OT Short Term Goal 3 (Week 1): Pt will complete LB dressing with max A OT Short Term Goal 3 - Progress (Week 1): Not met Week 2:  OT Short Term Goal 1 (Week 2): Pt will don shirt with mod A OT  Short Term Goal 2 (Week 2): Pt will maintain EOB sitting balance with mod A OT Short Term Goal 3 (Week 2): Pt will complete LB dressing with max A OT Short Term Goal 4 (Week 2): Pt will complete transfer bed > roll in shower chair with max assist of one caregiver  Skilled Therapeutic Interventions/Progress Updates:    Treatment session with focus on activity tolerance and functional mobility.  Pt received supine in bed agreeable to therapy session.  Pt reporting feeling better this date than yesterday and expressing desire to shower.  Completed rolling with max assist and sidelying to sitting max-total assist +2 due to increased tone.  Engaged in sit > stand in [place] with improved ability from Sat, however still requiring max assist +2.  Pt unable to maintain body positioned upright in [place], continuing to collapse forward requiring total assist to maintain upright.  Terminated transfer to shower as pt unable to follow commands to attempt to correct posture.  Returned to sitting EOB.  Unable to maintain sitting EOB due to increased tone, nearly slipping off bed.  Therefore pt returned to supine in bed total assist +3.  Pt with increased fatigue after mobility, falling asleep as soon as head hit the pillow.  Notified RN and PA of pt current condition and this therapist concerns.  Therapy Documentation Precautions:  Precautions Precautions: Fall Precaution Comments: hx MS (R LE deficits > L LE, spastic diplegia) Restrictions Weight Bearing Restrictions: No General: General OT Amount of Missed Time: 35 Minutes Pain: Pain Assessment Faces Pain Scale: Hurts  a little bit Pain Type: Acute pain Pain Location: Leg Pain Orientation: Right Pain Descriptors / Indicators: Grimacing Pain Onset: Sudden Pain Intervention(s): Repositioned Multiple Pain Sites: No   Therapy/Group: Individual Therapy  Simonne Come 12/14/2019, 12:33 PM

## 2019-12-14 NOTE — Progress Notes (Signed)
Physical Therapy Session Note  Patient Details  Name: Stephen Wade MRN: [id number] Date of Birth: 10-09-1951  Today's Date: 12/14/2019 PT Individual Time: 1300-1400 PT Individual Time Calculation (min): 60 min   Short Term Goals: Week 1:  PT Short Term Goal 1 (Week 1): Pt will perform bed mobility with mod assist PT Short Term Goal 2 (Week 1): Pt will transfer to [facility] with mod assist PT Short Term Goal 3 (Week 1): Pt will perform sit<>stand with mod assist and BUE support on rail or in stedy PT Short Term Goal 4 (Week 1): Pt will propell WC >176ft with supevision assist PT Short Term Goal 5 (Week 1): Pt will maintain sittng balance EOB with mod assist up to 5 min to prepare for use of power WC/scooter.  Skilled Therapeutic Interventions/Progress Updates:   Received pt supine in bed, pt agreeable to therapy, and denied any pain during today's session. Wife present during session. Pt reported he is feeling better today but declined OOB mobility stating he felt "too weak". Pt reported his legs were feeling tight and requested to perform LE strengthening and stretching activities. Pt emotional at various times during session, stating "i've had the shit kicked out of me". PT and wife provided emotional support and encouragement. Session focused on LE stretching, strengthening, family education, and improved tolerance to activity. Pt with increased RLE extensor tone during today's session limiting activity. Pt with extremely limited active movement of bilateral LE's. Pt performed isometric quadriceps contractions on LLE 3x5. Pt with increased straining while performing activity. Pt unable to facilitate contraction on RLE. Pt performed resisted plantarflexion on LLE against manual resistance from therapist x5 reps, but unable to perform on RLE. Therapist performed passive bilateral hamstring stretch with LE supported on therapist's shoulder 5x30 seconds each. Pt with increased discomfort initially but  stated decreased tightness along posterior LE. Pt performed crunches from semi-reclined position x5 reps mod A for trunk control and forward lean. Pt with increased effort during activity. Pt required multiple rest breaks throughout session due to increased fatigue. Pt noted to close his eyes frequently towards end of session. Therapist performed passive bilateral heel slides with emphasis on increased knee and hip flexion. Therapist performed bilateral hip adductor stretch 3x30 seconds each. Pt performed active assisted bilateral piriformis stretch 2x30 seconds each and active assisted hip and knee flexion. Pt reported decreased tightness in LE's at end of session and pt and wife thankful to therapist. Concluded session with pt supine in bed, needs within reach, bed alarm on, and wife present at bedside.   Therapy Documentation Precautions:  Precautions Precautions: Fall Precaution Comments: hx MS (R LE deficits > L LE, spastic diplegia) Restrictions Weight Bearing Restrictions: No  Therapy/Group: Individual Therapy Alfonse Alpers PT, DPT   12/14/2019, 7:35 AM

## 2019-12-14 NOTE — Plan of Care (Signed)
  Problem: Consults Goal: RH GENERAL PATIENT EDUCATION Description: See Patient Education module for education specifics. Outcome: Progressing   Problem: RH BOWEL ELIMINATION Goal: RH STG MANAGE BOWEL WITH ASSISTANCE Description: STG Manage Bowel with Min Assistance. Outcome: Progressing Goal: RH STG MANAGE BOWEL W/MEDICATION W/ASSISTANCE Description: STG Manage Bowel with Medication with [place]. Outcome: Progressing   Problem: RH SKIN INTEGRITY Goal: RH STG SKIN FREE OF INFECTION/BREAKDOWN Description: Min assist Outcome: Progressing Goal: RH STG MAINTAIN SKIN INTEGRITY WITH ASSISTANCE Description: STG Maintain Skin Integrity With [place]. Outcome: Progressing   Problem: RH SAFETY Goal: RH STG ADHERE TO SAFETY PRECAUTIONS W/ASSISTANCE/DEVICE Description: STG Adhere to Safety Precautions With Min Assistance/Device. Outcome: Progressing Goal: RH STG DECREASED RISK OF FALL WITH ASSISTANCE Description: STG Decreased Risk of Fall With [employer]. Outcome: Progressing

## 2019-12-14 NOTE — Plan of Care (Signed)
  Problem: Consults Goal: RH GENERAL PATIENT EDUCATION Description: See Patient Education module for education specifics. Outcome: Progressing   Problem: RH BOWEL ELIMINATION Goal: RH STG MANAGE BOWEL WITH ASSISTANCE Description: STG Manage Bowel with Min Assistance. Outcome: Progressing Goal: RH STG MANAGE BOWEL W/MEDICATION W/ASSISTANCE Description: STG Manage Bowel with Medication with [place]. Outcome: Progressing   Problem: RH BLADDER ELIMINATION Goal: RH STG MANAGE BLADDER WITH ASSISTANCE Description: STG Manage Bladder With max Assistance Outcome: Progressing   Problem: RH SKIN INTEGRITY Goal: RH STG SKIN FREE OF INFECTION/BREAKDOWN Description: Min assist Outcome: Progressing Goal: RH STG MAINTAIN SKIN INTEGRITY WITH ASSISTANCE Description: STG Maintain Skin Integrity With [place]. Outcome: Progressing   Problem: RH SAFETY Goal: RH STG ADHERE TO SAFETY PRECAUTIONS W/ASSISTANCE/DEVICE Description: STG Adhere to Safety Precautions With Min Assistance/Device. Outcome: Progressing Goal: RH STG DECREASED RISK OF FALL WITH ASSISTANCE Description: STG Decreased Risk of Fall With [employer]. Outcome: Progressing

## 2019-12-14 NOTE — Progress Notes (Signed)
Occupational Therapy Note  Patient Details  Name: Stephen Wade MRN: [id number] Date of Birth: [birth year]  Pt's plan of care adjusted to 15/7 after speaking with care team and discussed with PA as pt currently unable to tolerate current therapy schedule with OT, PT, and SLP.    Simonne Come 12/14/2019, 12:28 PM

## 2019-12-14 NOTE — Progress Notes (Signed)
Speech Language Pathology Daily Session Note  Patient Details  Name: Stephen Wade MRN: [id number] Date of Birth: [birth year]  Today's Date: 12/14/2019 SLP Individual Time: 0730-0830 SLP Individual Time Calculation (min): 60 min  Short Term Goals: Week 1: SLP Short Term Goal 1 (Week 1): Pt will consume Dys 1/nectar diet with Min overt s/sx aspiration and efficient mastication and oral clearance with Min A verbal cues for use of swallow strtaegies. SLP Short Term Goal 2 (Week 1): Pt will consume trials of thin H2O via teaspoon with Min A verbal cues for use of swallow strategies and Min overt s/sx aspiration. SLP Short Term Goal 3 (Week 1): Pt will demonstrate recall of new/day to day information with Mod A verbal/visual cues for use of compensatory strategies and/or external aids. SLP Short Term Goal 4 (Week 1): Pt will demonstrate ability to problem solve during functional mildly complex tasks with Mod A verbal/visual cues. SLP Short Term Goal 5 (Week 1): Pt will use speech intelligibility strategie with Min A verbal cues to achieve 80% intelligibility at the sentence level.  Skilled Therapeutic Interventions: Pt was seen for skilled ST targeting dysphagia and short term recall cognitive goals. Upon arrival, pt was less fatigued and reported feeling "a little better" than yesterday; he appeared brighter in affect to this SLP. However, pt exhibited mild confusion/disorientation to time and situation. Total A provided for pt to recall course of hospitalization across [facility] and recent transfer to [facility] for CIR program as well as MS exacerbation as primary reason for admit. Pt also questioned how long he had been in the hospital - verbal review of admission dates reviewed with pt. Pt oriented to month independently. He also recalled this therapist's discipline and 1 general activity targeted in [place] yesterday with Min A question cues. Improved recall of reasoning for thickened liquids evidenced in comparison  to previous session, as pt able to state "things were going down my windpipe, which can cause problems." Continued education provided regarding swallow anatomy and aspiration pneumonia. SLP provided Max A for self feeding and skilled observation of pt consuming Dys 1 breakfast tray and nectar thick liquids with immediate cough X1 following bite of grits. Efficient mastication and oral clearance of solids observed. Recommend continue current diet and continue to assess readiness for repeat instrumental swallow assessment as pt is able to maintain alertness and management of secretions. Significantly improve management of secretions noted today, pt did not require suctioning and only 2 instances of Min anterior spill of saliva noted. Provided Mod A verbal and visual cues, pt performed effortful swallow exercise X10 to promote pharyngeal strengthening. Pt also with significantly improved ability to perform 3 sets of 5 (15 total) EMST exercises with device set to 12 cm H2O and Min A verbal cues for accuracy today in comparison to yesterday. Pt also able to hold device on his own today.  Pt was left laying in bed with alarm set and all needs within reach. Continue per current plan of care.        Pain Pain Assessment Pain Scale: Faces Faces Pain Scale: Hurts a little bit Pain Type: Acute pain Pain Location: Leg Pain Orientation: Right Pain Descriptors / Indicators: Grimacing Pain Onset: Sudden Pain Intervention(s): Repositioned Multiple Pain Sites: No  Therapy/Group: Individual Therapy  Arbutus Leas 12/14/2019, 8:47 AM

## 2019-12-15 ENCOUNTER — Inpatient Hospital Stay (HOSPITAL_COMMUNITY): Payer: Medicare Other | Admitting: Physical Therapy

## 2019-12-15 ENCOUNTER — Inpatient Hospital Stay (HOSPITAL_COMMUNITY): Payer: Medicare Other | Admitting: Occupational Therapy

## 2019-12-15 ENCOUNTER — Inpatient Hospital Stay (HOSPITAL_COMMUNITY): Payer: Medicare Other

## 2019-12-15 ENCOUNTER — Inpatient Hospital Stay (HOSPITAL_COMMUNITY): Payer: Medicare Other | Admitting: Speech Pathology

## 2019-12-15 LAB — URINE CULTURE: Culture: >=100000 — AB

## 2019-12-15 MED ORDER — CIPROFLOXACIN HCL 500 MG PO TABS
750.0000 mg | ORAL_TABLET | Freq: Two times a day (BID) | ORAL | Status: DC
  Administered 2019-12-15 – 2019-12-17 (×5): 750 mg via ORAL
  Filled 2019-12-15 (×5): qty 1

## 2019-12-15 MED ORDER — CIPROFLOXACIN HCL 500 MG PO TABS: 500.0000 mg | ORAL_TABLET | Freq: Two times a day (BID) | ORAL | Status: DC

## 2019-12-15 NOTE — Progress Notes (Signed)
Physical Therapy Session Note  Patient Details  Name: Stephen Wade MRN: [id number] Date of Birth: [birth year]  Today's Date: 12/15/2019 PT Individual Time: 1130-1200 PT Individual Time Calculation (min): 30 min   Short Term Goals: Week 1:  PT Short Term Goal 1 (Week 1): Pt will perform bed mobility with mod assist PT Short Term Goal 2 (Week 1): Pt will transfer to [facility] with mod assist PT Short Term Goal 3 (Week 1): Pt will perform sit<>stand with mod assist and BUE support on rail or in stedy PT Short Term Goal 4 (Week 1): Pt will propell WC >11ft with supevision assist PT Short Term Goal 5 (Week 1): Pt will maintain sittng balance EOB with mod assist up to 5 min to prepare for use of power WC/scooter.      Skilled Therapeutic Interventions/Progress Updates:  Pt resting in bed.  He denied pain, but reported discomfort due to RLE spasticity.  Gentle PROM bil hips.  Bed mobility training for rolling R>< partial r side lying with a towel roll behind back.  PT donned pt's pants with pt in supine.  R side lying> sitting in flat bed with max assist.  Pt sat EOB with min assist for balance.  Slide board transfer to R +2 to recliner with wc cushion in it.  +2 to scoot him back into recliner.    At end of session, pt reclined in recliner with bil LEs elevated, needs at hand.     Therapy Documentation Precautions:  Precautions Precautions: Fall Precaution Comments: hx MS (R LE deficits > L LE, spastic diplegia) Restrictions Weight Bearing Restrictions: No   Pain: Pain Assessment Pain Scale: 0-10 Pain Score: 0-No pain      Therapy/Group: Individual Therapy  Nyaisha Simao 12/15/2019, 12:09 PM

## 2019-12-15 NOTE — Progress Notes (Signed)
Physical Therapy Weekly Progress Note  Patient Details  Name: Stephen Wade MRN: [id number] Date of Birth: Dec 13, 1951  Beginning of progress report period: December 08, 2019 End of progress report period: December 15, 2019  Patient has met 1 of 5 short term goals. Pt demonstrates improvements with cognition and memory compared to last week. However, pt presents with increased fatigue, bilateral LE and core weakness, and increased R LE tone. Pt currently requires max A/+2 assist for bed mobility, +2 assist for bed<>chair transfers with slideboard, and +2 assist to attempt to stand in stedy.   Patient continues to demonstrate the following deficits muscle weakness, impaired timing and sequencing, abnormal tone, decreased coordination and decreased motor planning and decreased sitting balance, decreased standing balance, decreased postural control and decreased balance strategies and therefore will continue to benefit from skilled PT intervention to increase functional independence with mobility.  Patient progressing toward long term goals..  Continue plan of care.  PT Short Term Goals Week 1:  PT Short Term Goal 1 (Week 1): Pt will perform bed mobility with mod assist PT Short Term Goal 1 - Progress (Week 1): Progressing toward goal PT Short Term Goal 2 (Week 1): Pt will transfer to [facility] with mod assist PT Short Term Goal 2 - Progress (Week 1): Progressing toward goal PT Short Term Goal 3 (Week 1): Pt will perform sit<>stand with mod assist and BUE support on rail or in stedy PT Short Term Goal 3 - Progress (Week 1): Progressing toward goal PT Short Term Goal 4 (Week 1): Pt will propell WC >193f with supevision assist PT Short Term Goal 4 - Progress (Week 1): Progressing toward goal PT Short Term Goal 5 (Week 1): Pt will maintain sittng balance EOB with mod assist up to 5 min to prepare for use of power WC/scooter. PT Short Term Goal 5 - Progress (Week 1): Met Week 2:  PT Short Term Goal 1  (Week 2): Pt will perform bed mobility with mod assist PT Short Term Goal 2 (Week 2): [facility] Short Term Goal 3 (Week 2): Pt will perform sit<>stand with mod assist and BUE support on rail or in stedy PT Short Term Goal 4 (Week 2): Pt will propel WC >1523fwith supevision assist  Skilled Therapeutic Interventions/Progress Updates:  Discharge planning;DME/adaptive equipment instruction;Functional mobility training;Pain management;Psychosocial support;Therapeutic Activities;UE/LE Strength taining/ROM;Balance/vestibular training;Community reintegration;Disease management/prevention;Functional electrical stimulation;Neuromuscular re-education;Patient/family education;Ambulation/gait training;Therapeutic Exercise;UE/LE Coordination activities;Wheelchair propulsion/positioning   Therapy Documentation Precautions:  Precautions Precautions: Fall Precaution Comments: hx MS (R LE deficits > L LE, spastic diplegia) Restrictions Weight Bearing Restrictions: No   AnAlfonse AlpersT, DPT  12/15/2019, 3:38 PM

## 2019-12-15 NOTE — Progress Notes (Signed)
[place] PHYSICAL MEDICINE & REHABILITATION PROGRESS NOTE  Subjective/Complaints: Patient seen sitting up in bed this AM.  He states he slept well overnight. No reported issues overnight.   ROS: Denies CP, SOB, N/V/D  Objective: Vital Signs: Blood pressure 126/78, pulse 83, temperature 98 F (36.7 C), resp. rate 18, height 6' (1.829 m), weight 88.5 kg, SpO2 93 %. DG Chest 2 View  Result Date: 12/13/2019 CLINICAL DATA:  Sepsis. EXAM: CHEST - 2 VIEW COMPARISON:  12/07/2019 FINDINGS: The cardiac silhouette, mediastinal and hilar contours are within normal limits and stable. Low lung volumes with vascular crowding and streaky bibasilar atelectasis. Persistent patchy left basilar density could reflect persistent infiltrate. No pleural effusions. IMPRESSION: Low lung volumes with vascular crowding and streaky bibasilar atelectasis. Persistent right basilar density, likely persistent infiltrate. Electronically Signed   By: Rudie Meyer M.D.   On: 12/13/2019 11:41   Recent Labs    12/13/19 1203  WBC 9.0  HGB 13.9  HCT 42.4  PLT 251   Recent Labs    12/13/19 0610 12/14/19 0538  NA 141  --   K 4.4  --   CL 108  --   CO2 25  --   GLUCOSE 95  --   BUN 16  --   CREATININE 0.75 1.23  CALCIUM 9.1  --     Physical Exam: BP 126/78 (BP Location: Right Arm)   Pulse 83   Temp 98 F (36.7 C)   Resp 18   Ht 6' (1.829 m)   Wt 88.5 kg   SpO2 93%   BMI 26.46 kg/m  Constitutional: No distress . Vital signs reviewed. HENT: Normocephalic.  Atraumatic. Eyes: EOMI. No discharge. Cardiovascular: No JVD. Respiratory: Normal effort.  No stridor. GI: Non-distended. Skin: Warm and dry.  Intact. Psych: Slowed.  Flat. Musc: Lower extremity edema. GU: + Foley Neuro: Alert Motor: Bilateral upper extremities: 4+-5/5 proximal distal Right lower extremity: 1/5 proximal distal, stable Left lower extremity: Hip flexion, knee extension 1+/5, ankle dorsiflexion 1+/5, stable Dysarthria,  unchanged Increased tone in bilateral lower extremities, unchanged  Assessment/Plan: 1. Functional deficits secondary to debility which require 3+ hours per day of interdisciplinary therapy in a comprehensive inpatient rehab setting.  Physiatrist is providing close team supervision and 24 hour management of active medical problems listed below.  Physiatrist and rehab team continue to assess barriers to discharge/monitor patient progress toward functional and medical goals  Care Tool:  Bathing    Body parts bathed by patient: Chest   Body parts bathed by helper: Right arm, Left arm, Abdomen, Front perineal area, Buttocks, Right upper leg, Left upper leg, Right lower leg, Left lower leg, Face     Bathing assist Assist Level: 2 Helpers     Upper Body Dressing/Undressing Upper body dressing   What is the patient wearing?: Pull over shirt    Upper body assist Assist Level: Total Assistance - Patient < 25%    Lower Body Dressing/Undressing Lower body dressing      What is the patient wearing?: Underwear/pull up, Pants     Lower body assist Assist for lower body dressing: 2 Helpers     Toileting Toileting    Toileting assist Assist for toileting: Dependent - Patient 0%     Transfers Chair/bed transfer  Transfers assist  Chair/bed transfer activity did not occur: Safety/medical concerns  Chair/bed transfer assist level: 2 Helpers     Locomotion Ambulation   Ambulation assist   Ambulation activity did not occur: Safety/medical  concerns          Walk 10 feet activity   Assist  Walk 10 feet activity did not occur: Safety/medical concerns        Walk 50 feet activity   Assist Walk 50 feet with 2 turns activity did not occur: Safety/medical concerns         Walk 150 feet activity   Assist Walk 150 feet activity did not occur: Safety/medical concerns         Walk 10 feet on uneven surface  activity   Assist Walk 10 feet on uneven  surfaces activity did not occur: Safety/medical concerns         Wheelchair     Assist Will patient use wheelchair at discharge?: Yes Type of Wheelchair: Manual    Wheelchair assist level: Supervision/Verbal cueing Max wheelchair distance: 2550ft    Wheelchair 50 feet with 2 turns activity    Assist        Assist Level: Supervision/Verbal cueing   Wheelchair 150 feet activity     Assist Wheelchair 150 feet activity did not occur: Safety/medical concerns          Medical Problem List and Plan: 1.  Decreased functional mobility secondary to acute respiratory distress with hypoxic respiratory failure secondary to multi focal pneumonia in the setting of MS.    Follow-up chest x-ray 12/07/2019 bibasilar pulmonary infiltrates improved from prior exams, repeat chest x-ray on 12/21 personally reviewed showing?  Persistence of infiltrate- monitor at present per Pulm.   Patient completed 10-day course of Augmentin on 12/17  Completed 5-day course of IV Solu-Medrol.    Continue CIR  Team conference today to discuss current and goals and coordination of care, home and environmental barriers, and discharge planning with nursing, case manager, and therapies.  2.  Antithrombotics: DVT/anticoagulation: Subcutaneous Lovenox.  CT angiogram of chest negative for pulmonary emboli             Antiplatelet therapy: N/A 3. Pain Management/spasticity: Baclofen 5 mg 3 times daily 4. Mood: Provide emotional support  Appreciate neuropsych eval  Fluoxetine started on 12/18, will consider further increase later this week             -antipsychotic agents: N/A 5. Neuropsych: This patient is not capable of making decisions on his own behalf. 6. Skin/Wound Care: Routine skin checks/pressure injury to buttocks.  Follow-up skin care checks 7. Fluids/Electrolytes/Nutrition: Routine in and outs 8.  Atrial fibrillation with RVR.  IV Cardizem discontinued due to hypotension.  No current plan for  anticoagulation  Monitor with increased mobility. 9.  Neurogenic bowel bladder with recurrent UTIs.  Indwelling Foley catheter tube to remain in place at the recommendations of urology services per plan outpatient evaluation for possible need for suprapubic tube  UA on 12/21 suggestive of infection, urine culture showing Pseudomonas, pansensitive  Cipro started on 12/23, Foley replaced-discussed with nursing 10.  Dysphagia.  Dysphagia #1 nectar liquids.  Follow-up speech therapy.    Advance diet as tolerated. 11.AKI:   Creatinine relatively elevated on 12/22, however within normal limits thus far,?  Erroneous, will repeat labs later this week  Cont IVF 12. Spasticity in setting of MS  Baclofen increased to home dose of 10 mg 3 times daily on 12/17, decreased back to 5 mg 3 times daily on 12/19 due to lethargy, tolerating 13. Steroid induced hyperglycemia: ?Resolved  Cont to monitor 14.  Hypernatremia  Sodium 141 on 12/21  Cont to monitor 15. Hypoalbuminemia  Supplement  initiated on 12/16 16.  Transaminitis  LFTs elevated, but improving on 12/21  Continue to monitor 17. ABLA: Resolved  Hb 13.9 on 12/21  Cont to monitor    LOS: 8 days A FACE TO FACE EVALUATION WAS PERFORMED   Lorie Phenix 12/15/2019, 8:23 AM

## 2019-12-15 NOTE — Progress Notes (Signed)
Occupational Therapy Session Note  Patient Details  Name: Stephen Wade MRN: [id number] Date of Birth: 11-Dec-1951  Today's Date: 12/15/2019 OT Individual Time: 1420-1533 OT Individual Time Calculation (min): 73 min    Short Term Goals: Week 2:  OT Short Term Goal 1 (Week 2): Pt will don shirt with mod A OT Short Term Goal 2 (Week 2): Pt will maintain EOB sitting balance with mod A OT Short Term Goal 3 (Week 2): Pt will complete LB dressing with max A OT Short Term Goal 4 (Week 2): Pt will complete transfer bed > roll in shower chair with max assist of one caregiver  Skilled Therapeutic Interventions/Progress Updates:    Pt completed recliner to bed sliding board transfer to start session secondary to requesting the need to go to the bathroom.  He needed max facilitation to bring his trunk forward with total assist +2 (pt 25%) to complete transfer across the board.  Once on the EOB, he needed max assist to maintain his sitting balance with total assist +2 (pt 25%) for transfer from sitting to supine.  He was able to roll to the left with max assist while therapist provided total assist for clothing management and for positioning of the bed pan.  He was successful with BM and required total assist for clean up in sidelying with total assist for application of a new brief and for pulling clothing over his hips.  Total +2 (pt 20%) for supine to sitting EOB to work on trunk control.  He needed use of his UEs for support and overall max assist for static sitting balance.  Had him work on bringing his UEs into his lap for work on balance with overall max assist.  He maintains flexed trunk and head in sitting.  Slight initiation of upright posture with head extension, but still maintains posterior pelvic tilt overall.  He completed some functional reaching unilaterally for facilitation of upright trunk, but he needs max assist for balance.  Finished session with scooting up to the [facility] with total assist +2  (pt 20%) and transfer to supine at the same level.  Pt left in the bed with spouse present and call button and phone in reach.    Therapy Documentation Precautions:  Precautions Precautions: Fall Precaution Comments: hx MS (R LE deficits > L LE, spastic diplegia) Restrictions Weight Bearing Restrictions: No  Pain: Pain Assessment Pain Scale: Faces Faces Pain Scale: Hurts a little bit Pain Type: Acute pain Pain Location: Leg Pain Orientation: Right Pain Descriptors / Indicators: Discomfort;Grimacing Pain Onset: With Activity Pain Intervention(s): Repositioned;Emotional support ADL: See Care Tool Section for some details for of selfcare and mobility  Therapy/Group: Individual Therapy  Quinto Tippy,Nicole OTR/L 12/15/2019, 4:51 PM

## 2019-12-15 NOTE — Progress Notes (Signed)
Physical Therapy Session Note  Patient Details  Name: Stephen Wade MRN: [id number] Date of Birth: Dec 12, 1951  Today's Date: 12/15/2019 PT Individual Time: 0905-1010 PT Individual Time Calculation (min): 65 min   Short Term Goals: Week 1:  PT Short Term Goal 1 (Week 1): Pt will perform bed mobility with mod assist PT Short Term Goal 2 (Week 1): Pt will transfer to [facility] with mod assist PT Short Term Goal 3 (Week 1): Pt will perform sit<>stand with mod assist and BUE support on rail or in stedy PT Short Term Goal 4 (Week 1): Pt will propell WC >140ft with supevision assist PT Short Term Goal 5 (Week 1): Pt will maintain sittng balance EOB with mod assist up to 5 min to prepare for use of power WC/scooter.  Skilled Therapeutic Interventions/Progress Updates: Pt presented in bed agreeable to therapy. Pt initially refused OOB activity however agreeable with encouragement. PTA and +2 (PT) performed BLE stretching with noted increased tone RLE vs LLE. Pt performed rolling L/R with modA to don shorts and pull over hips.  Performed supine to sit max to total A x 2 with maxA for trunk stability. Pt participated in sitting balance activities ab EOB approx 5-7 min with PTA assisting with hans placement and stabilizing hands to facilitate trunk activation. Pt was able to maintain sitting balance for bouts of approx 10 sec with minA. Pt then agreeable to transfer to recliner (refused w/c). Performed SB transfer maxA x 2 to recliner with manual facilitation to increase anterior lean. Once in recliner pt required maxA for scooting hips back in recliner. PTA used pillows and pool noodle for positioning however pt wound up in sacral sit position. In final attempt to readjust (with +2 assistance) PTA noted smell of possible BM with pt stating unaware if had BM. It was decided to ultimately perform SB transfer back bed requiring maxA x 2 and total A x 2 to return to supine. Pt was boosted to [facility] dependent x 2 via use of  draw sheet. Pt left in bed with call bell within reach and nsg notified of pt's disposition.      Therapy Documentation Precautions:  Precautions Precautions: Fall Precaution Comments: hx MS (R LE deficits > L LE, spastic diplegia) Restrictions Weight Bearing Restrictions: No General:   Vital Signs:     Therapy/Group: Individual Therapy  Tija Biss  Janashia Parco, PTA  12/15/2019, 12:42 PM

## 2019-12-15 NOTE — Plan of Care (Signed)
  Problem: Consults Goal: RH GENERAL PATIENT EDUCATION Description: See Patient Education module for education specifics. Outcome: Progressing   Problem: RH BOWEL ELIMINATION Goal: RH STG MANAGE BOWEL WITH ASSISTANCE Description: STG Manage Bowel with Min Assistance. Outcome: Progressing Goal: RH STG MANAGE BOWEL W/MEDICATION W/ASSISTANCE Description: STG Manage Bowel with Medication with Min Assistance. Outcome: Progressing   Problem: RH BLADDER ELIMINATION Goal: RH STG MANAGE BLADDER WITH ASSISTANCE Description: STG Manage Bladder With max Assistance Outcome: Progressing   Problem: RH SKIN INTEGRITY Goal: RH STG SKIN FREE OF INFECTION/BREAKDOWN Description: Min assist Outcome: Progressing Goal: RH STG MAINTAIN SKIN INTEGRITY WITH ASSISTANCE Description: STG Maintain Skin Integrity With Min Assistance. Outcome: Progressing   Problem: RH SAFETY Goal: RH STG ADHERE TO SAFETY PRECAUTIONS W/ASSISTANCE/DEVICE Description: STG Adhere to Safety Precautions With Min Assistance/Device. Outcome: Progressing Goal: RH STG DECREASED RISK OF FALL WITH ASSISTANCE Description: STG Decreased Risk of Fall With Min Assistance. Outcome: Progressing   

## 2019-12-15 NOTE — Progress Notes (Signed)
Speech Language Pathology Weekly Progress and Session Note  Patient Details  Name: Stephen Wade MRN: [id number] Date of Birth: 1951-03-04  Beginning of progress report period: December 08, 2019 End of progress report period: December 15, 2019  Today's Date: 12/15/2019 SLP Individual Time: 0830-0900 SLP Individual Time Calculation (min): 30 min  Short Term Goals: Week 1: SLP Short Term Goal 1 (Week 1): Pt will consume Dys 1/nectar diet with Min overt s/sx aspiration and efficient mastication and oral clearance with Min A verbal cues for use of swallow strtaegies. SLP Short Term Goal 1 - Progress (Week 1): Met SLP Short Term Goal 2 (Week 1): Pt will consume trials of thin H2O via teaspoon with Min A verbal cues for use of swallow strategies and Min overt s/sx aspiration. SLP Short Term Goal 2 - Progress (Week 1): Progressing toward goal SLP Short Term Goal 3 (Week 1): Pt will demonstrate recall of new/day to day information with Mod A verbal/visual cues for use of compensatory strategies and/or external aids. SLP Short Term Goal 3 - Progress (Week 1): Met SLP Short Term Goal 4 (Week 1): Pt will demonstrate ability to problem solve during functional mildly complex tasks with Mod A verbal/visual cues. SLP Short Term Goal 4 - Progress (Week 1): Progressing toward goal SLP Short Term Goal 5 (Week 1): Pt will use speech intelligibility strategie with Min A verbal cues to achieve 80% intelligibility at the sentence level. SLP Short Term Goal 5 - Progress (Week 1): Progressing toward goal    New Short Term Goals: Week 2: SLP Short Term Goal 1 (Week 2): Pt will consume Dys 1/nectar diet with Min overt s/sx aspiration and efficient mastication and oral clearance with Supervision A verbal cues for use of swallow strtaegies. SLP Short Term Goal 2 (Week 2): Pt will consume trials of thin H2O via teaspoon with Min A verbal cues for use of swallow strategies and Min overt s/sx aspiration. SLP Short Term  Goal 3 (Week 2): Pt will demonstrate ability to manage secretions via throat clear/cough and use of suction with Min A verbal cues. SLP Short Term Goal 4 (Week 2): Pt will demonstrate recall of new/day to day information with Min A verbal/visual cues for use of compensatory strategies and/or external aids. SLP Short Term Goal 5 (Week 2): Pt will demonstrate ability to problem solve during functional mildly complex tasks with Mod A verbal/visual cues. SLP Short Term Goal 6 (Week 2): Pt will use speech intelligibility strategies with Min A verbal cues to achieve 80% intelligibility at the sentence level.  Weekly Progress Updates: Pt has made slow gains this reporting period due to increased weakness and fatigue, which limited his participation and influence his performance. As a result he met 2 out of 5 short term goals. Pt is currently Mod assist for due to cognitive impairments impacting short term memory and problem solving, dysarthria impacting speech intelligibility, and oropharyngeal dysphagia which presents a moderate aspiration risk. Pt is consuming Dys 1 solid diet with nectar liquids with Min A verbal cues for use of swallow strategies. Pt has demonstrated improved recall of some functional information such as rationale behind current diet and swallow precautions, as well as adherence to diet and use of swallow strategies. Pt and family education is ongoing. Pt would continue to benefit from skilled ST while inpatient in order to maximize functional independence and reduce burden of care prior to discharge. Anticipate that pt will need 24/7 supervision at discharge in addition to [place]  follow up at next level of care.      Intensity: Minumum of 1-2 x/day, 30 to 90 minutes Frequency: 3 to 5 out of 7 days Duration/Length of Stay: 3-4 weeks Treatment/Interventions: Cognitive remediation/compensation;Cueing hierarchy;Dysphagia/aspiration precaution training;Functional tasks;Patient/family  education;Therapeutic Activities;Speech/Language facilitation;Internal/external aids   Daily Session  Skilled Therapeutic Interventions: Pt was seen for skilled ST targeting dysphagia and speech goals. Pt was sitting upright in bed eating breakfast upon therapist's arrival. At his request, SLP provided set up assist for him to complete oral care via suction with Supervision A verbal cues for operating suction properly during brushing. Pt then completed EMST exercises X5 at 12cm H2O with perceived difficulty of 4. Therefore, resistance increased to 15cm H2O, and pt performed 3 sets of 5 exercises (15 total) at this resistance level with Min A verbal cues for accuracy and self-rated difficulty level if 7/10. Device left set to 15 cm H2O and pt advised to continue 15 exercises 2X daily. Pt also performed effortful swallow exercise X10 with Max A verbal cues for verbal recall, Mod A verbal cues for accuracy during execution. Written handout provided to assist with recall of instructions and recommended pt perform 10 effortful swallows 3-5X daily as he is able. Pt consumed nectar thick liquids and puree solids without overt s/sx aspiration and Min A for use of swallow precautions. He also exhibited improved secretion management - no pooled or anterior loss of saliva observed today. During phrase and sentence level conversation level tasks, pt required Mod A verbal cues to implement increased vocal intensity. His intelligibility was improved today in comparison to last 2 days, ~75% intelligible at the phrase and sentence levels. Pt left laying in bed with alarm set and all needs within reach. Continue per current plan of care.        Pain Pain Assessment Pain Scale: 0-10 Pain Score: 0-No pain  Therapy/Group: Individual Therapy  Arbutus Leas 12/15/2019, 10:46 AM

## 2019-12-15 NOTE — Progress Notes (Addendum)
Pharmacy Antibiotic Note  Stephen Wade is a 68 y.o. male admitted on 12/07/2019 with pseudonomas UTI.  Pharmacy has been consulted for ciprofloxacin dosing.  Culture + pan sensitive pseudomonas. WBC wnl. CrCl >60 ml/min. Foley will be replaced in the evening after PT per RN.  Plan: Ciprofloxacin 750mg  Q12hr  F/u LOT   Height: 6' (182.9 cm) Weight: 195 lb 1.7 oz (88.5 kg) IBW/kg (Calculated) : 77.6  Temp (24hrs), Avg:98 F (36.7 C), Min:97.9 F (36.6 C), Max:98.2 F (36.8 C)  Recent Labs  Lab 12/10/19 0607 12/13/19 0610 12/13/19 1203 12/14/19 0538  WBC 7.6  --  9.0  --   CREATININE 0.65 0.75  --  1.23  LATICACIDVEN  --   --  0.8  --     Estimated Creatinine Clearance: 64.8 mL/min (by C-G formula based on SCr of 1.23 mg/dL).    No Known Allergies  Antimicrobials this admission: cipro 12/23 >>   Microbiology results: 12/21 UCx: pseudonomas PanS   Thank you for allowing pharmacy to be a part of this patient's care.  Benetta Spar, PharmD, BCPS, BCCP Clinical Pharmacist  Please check AMION for all [facility] phone numbers After 10:00 PM, call [facility] [phone]

## 2019-12-15 NOTE — Progress Notes (Signed)
Social Work Patient ID: Stephen Wade, male   DOB: 05/11/1951, 68 y.o.   MRN: [id number]  Met with pt and wife to discuss team conference goals min assist level and target dischsrge date 1/13. Pt states: " I have a long way to go."  Wife reports if he feels better he will push himself but has had so many medical issues and does not feel well. Wife is here daily and participates in therapies with pt. Will work on discharge needs and toward discharge.

## 2019-12-15 NOTE — Patient Care Conference (Signed)
Inpatient RehabilitationTeam Conference and Plan of Care Update Date: 12/15/2019   Time: 11:30 AM    Patient Name: Stephen Wade      Medical Record Number: [id number]  Date of Birth: [birth year] Sex: Male         Room/Bed: 4W01C/4W01C-01 Payor Info: Payor: MEDICARE / Plan: MEDICARE PART A AND B / Product Type: *No Product type* /    Admit Date/Time:  12/07/2019  3:31 PM  Primary Diagnosis:  Debility  Patient Active Problem List   Diagnosis Date Noted  . Neurogenic bladder   . Spasticity   . Depression   . Debility 12/08/2019  . Acute blood loss anemia   . Transaminitis   . Hypoalbuminemia due to protein-calorie malnutrition ([place])   . Hypernatremia   . Steroid-induced hyperglycemia   . AKI (acute kidney injury) ([place])   . Dysphagia   . Acute respiratory failure ([place]) 12/07/2019  . Chronic indwelling Foley catheter 12/07/2019  . Acute respiratory failure with hypoxia ([place]) 11/28/2019  . Pneumonia 11/28/2019  . Atrial fibrillation with RVR ([place]) 11/28/2019  . UTI (urinary tract infection) 11/27/2019  . Acute metabolic encephalopathy 34/19/3790  . Urinary retention 06/22/2019  . PCP NOTES >>>>>>> 05/24/2018  . Depression, recurrent ([place]) 05/24/2018  . History of recurrent UTIs 05/22/2018  . Pressure injury of skin 11/06/2017  . Multiple sclerosis ([place]) 11/04/2017  . Skin ulcer of sacral region ([place]) 05/26/2017  . Vitamin D deficiency 01/16/2016  . Spastic gait 02/10/2015  . Spastic diplegia ([place]) 02/10/2015  . Other fatigue 02/10/2015  . Disturbed cognition 02/10/2015  . Edema 05/12/2014  . Hypogonadism male 03/11/2013  . BPH - self caths 12/26/2011  . GERD 11/30/2010  . URINARY CALCULUS 10/05/2010    Expected Discharge Date: Expected Discharge Date: 01/05/20  Team Members Present: Physician leading conference: Dr. Delice Lesch Social Worker Present: Ovidio Kin, LCSW Nurse Present: Other (comment)(Blair Montanti-LPN) Case manager:Genie Lititia Sen, RN  PT Present:  Becky Sax, PT OT Present: Roanna Epley, COTA SLP Present: Jettie Booze, CF-SLP PPS Coordinator present : Gunnar Fusi, SLP     Current Status/Progress Goal Weekly Team Focus  Bowel/Bladder   Incontinent of bowel. LBM 12/20 foley cath in place  less incontinent episodes  assess toileting needs qshift and PRN   Swallow/Nutrition/ Hydration   Dys 1/Nectar, poor secretion management, Mod A EMST and swallow exercises  Supevision A  Tolerance of current diet, potential for repeat MBSS if can demonstrate better ability to maintain alertness, EMST, swallow exercises, water protocol   ADL's   was able to complete bathing 12/18 with Mod assist (+2 for safety), but since then has been max-total assist and unable to maintain sitting balance even at EOB with +2 due to increased tone and difficulty maintaining arousal  Min A overall - will have to downgrade if pt does not begin to show improvement this week  ADL retraining, transfers, sitting balance, activity tolerance, cognition, pt/family education   Mobility   bed mobility +2, bed<>WC slideboard transfers +2 assist  min A transfers, mod A sit<>stand and bed mobility, WC mobility supervision  bed mobility, LE strength, stretching, postural control/sitting balance, transfers   Communication   Mod-Max A speech intelligibility strategies, intelligibility fluctuates depending on fatigue - can be anywhere from 50%-80% intellitible phrase/sentence  Supervision A  carryover speech intelligibility strategies (specifically increased vocal intensity and overarticulate)   Safety/Cognition/ Behavioral Observations  Mod-Max A, increaesed confusion as week has progressed  Min A  short term recall functional day  to day information, basic problem solving   Pain   No c/o pain  remain free of pain  assess pain qshift and PRN   Skin   MASD to bottom.gerherts cream ordered  no new areas of skin break down  assess skin qshift and PRN      *See Care Plan and progress  notes for long and short-term goals.     Barriers to Discharge  Current Status/Progress Possible Resolutions Date Resolved   Nursing                  PT  Medical stability;Neurogenic Bowel & Bladder  Pt with potential increase in RLE tone              OT                  SLP                SW                Discharge Planning/Teaching Needs:  Home with wife and hired assist, wife here daily and observes in therapies. Concerned regarding medical issues this week      Team Discussion: Confusion, decreased baclofen, infection concern, UA/culture ordered, abx ordered, replace foley, monitoring labs.  RN - inc bowel, need order for new foley.  OT new tone, fatigue, was mod A bathing, now max/total A, may have to downgrade goals to max A.  PT max A +2 for bed and transfers, min a goals, will need to downgrade, increased tone.  SLP S/min A goals, D1nectar, fatigue, managing secretions, repeat MBS next week, increased confusion, decreased STM.   Revisions to Treatment Plan: N/A     Medical Summary Current Status: Decreased functional mobility secondary to acute respiratory distress with hypoxic respiratory failure secondary to multi focal pneumonia in the setting of MS Weekly Focus/Goal: Improve mobility, swallowing, UTI, depression, AKI, spasticity  Barriers to Discharge: Medical stability;Neurogenic Bowel & Bladder;Nutrition means;Wound care   Possible Resolutions to Barriers: Therapies, advance diet as tolerated, follow labs, optimize antispasticity meds in conjunction with lethargy   Continued Need for Acute Rehabilitation Level of Care: The patient requires daily medical management by a physician with specialized training in physical medicine and rehabilitation for the following reasons: Direction of a multidisciplinary physical rehabilitation program to maximize functional independence : Yes Medical management of patient stability for increased activity during participation in an  intensive rehabilitation regime.: Yes Analysis of laboratory values and/or radiology reports with any subsequent need for medication adjustment and/or medical intervention. : Yes   I attest that I was present, lead the team conference, and concur with the assessment and plan of the team.   Trish Mage 12/15/2019, 6:31 PM  Team conference was held via web/ teleconference due to COVID - 19

## 2019-12-16 ENCOUNTER — Inpatient Hospital Stay (HOSPITAL_COMMUNITY): Payer: Medicare Other

## 2019-12-16 ENCOUNTER — Inpatient Hospital Stay (HOSPITAL_COMMUNITY): Payer: Medicare Other | Admitting: Speech Pathology

## 2019-12-16 DIAGNOSIS — N390 Urinary tract infection, site not specified: Secondary | ICD-10-CM

## 2019-12-16 NOTE — Progress Notes (Signed)
Physical Therapy Session Note  Patient Details  Name: Stephen Wade MRN: [id number] Date of Birth: 10/25/1951  Today's Date: 12/16/2019 PT Individual Time: 1100-1206 PT Individual Time Calculation (min): 66 min   Short Term Goals: Week 1:  PT Short Term Goal 1 (Week 1): Pt will perform bed mobility with mod assist PT Short Term Goal 1 - Progress (Week 1): Progressing toward goal PT Short Term Goal 2 (Week 1): Pt will transfer to WC with mod assist PT Short Term Goal 2 - Progress (Week 1): Progressing toward goal PT Short Term Goal 3 (Week 1): Pt will perform sit<>stand with mod assist and BUE support on rail or in stedy PT Short Term Goal 3 - Progress (Week 1): Progressing toward goal PT Short Term Goal 4 (Week 1): Pt will propell WC >150ft with supevision assist PT Short Term Goal 4 - Progress (Week 1): Progressing toward goal PT Short Term Goal 5 (Week 1): Pt will maintain sittng balance EOB with mod assist up to 5 min to prepare for use of power WC/scooter. PT Short Term Goal 5 - Progress (Week 1): Met Week 2:  PT Short Term Goal 1 (Week 2): Pt will perform bed mobility with mod assist PT Short Term Goal 2 (Week 2): Pt will transfer to WC with mod assist PT Short Term Goal 3 (Week 2): Pt will perform sit<>stand with mod assist and BUE support on rail or in stedy PT Short Term Goal 4 (Week 2): Pt will propel WC >150ft with supevision assist  Skilled Therapeutic Interventions/Progress Updates:   Received pt sitting in WC, pt agreeable to therapy, and denied any pain during today's session. Session focused on functional mobility/transfers, fitting for tilt in space WC, LE strength, postural control and alignment, family education, and improved tolerance to activity. Pt emotional reporting that he had no energy and couldn't stand to eat the food here and would rather choke. Pt and wife educated on diet restrictions and risks of not following SLP instructions. Pt transported to therapy gym in  WC for time management purposes. PT provided pt with tilt in space WC during today's session for comfort and due to poor posture in manual WC. Pt transferred manual WC<>mat via slideboard +2 assist. Pt required mod A to maintain seated balance. Pt transferred mat<>tilt in space WC via slideboard+2 assist. Pt demonstrated improved postural alignment and trunk control in tilt in space chair and reported increased comfort. Pt transported back to room for energy conservation purposes. Pt performed 2x8 active assisted hip flexion with verbal cues for breathing techniques. Pt performed 2x5 L LE active assisted heel slides on towel. Pt able to facilitate hamstring contraction to slide into knee flexion but requires total assist to straighten out LLE. Concluded session with pt sitting in tilt in space chair, needs within reach, and seatbelt alarm on.   Therapy Documentation Precautions:  Precautions Precautions: Fall Precaution Comments: hx MS (R LE deficits > L LE, spastic diplegia) Restrictions Weight Bearing Restrictions: No   Therapy/Group: Individual Therapy Anna M Johnson  Anna Johnson PT, DPT   12/16/2019, 7:39 AM  

## 2019-12-16 NOTE — Progress Notes (Signed)
[facility] PHYSICAL MEDICINE & REHABILITATION PROGRESS NOTE  Subjective/Complaints: Patient seen sitting up in bed this morning.  He states he slept well overnight.  Increased tone noted.  No reported issues overnight.  ROS: Denies CP, SOB, N/V/D  Objective: Vital Signs: Blood pressure (!) 143/85, pulse 99, temperature 97.9 F (36.6 C), resp. rate 18, height 6' (1.829 m), weight 88.5 kg, SpO2 97 %. No results found. Recent Labs    12/13/19 1203  WBC 9.0  HGB 13.9  HCT 42.4  PLT 251   Recent Labs    12/14/19 0538  CREATININE 1.23    Physical Exam: BP (!) 143/85 (BP Location: Right Arm)   Pulse 99   Temp 97.9 F (36.6 C)   Resp 18   Ht 6' (1.829 m)   Wt 88.5 kg   SpO2 97%   BMI 26.46 kg/m  Constitutional: No distress . Vital signs reviewed. HENT: Normocephalic.  Atraumatic. Eyes: EOMI. No discharge. Cardiovascular: No JVD. Respiratory: Normal effort.  No stridor. GI: Non-distended. Skin: Warm and dry.  Intact. Psych: Slowed.  Flat. Musc: Lower extremity edema. GU: + Foley. Neuro: Alert Dysarthria Motor: Bilateral upper extremities: 4+-5/5 proximal distal Right lower extremity: 1/5 proximal distal, unchanged Left lower extremity: Hip flexion, knee extension 1+/5, ankle dorsiflexion 1+/5, unchanged Increased tone in bilateral lower extremities, increased  Assessment/Plan: 1. Functional deficits secondary to debility which require 3+ hours per day of interdisciplinary therapy in a comprehensive inpatient rehab setting.  Physiatrist is providing close team supervision and 24 hour management of active medical problems listed below.  Physiatrist and rehab team continue to assess barriers to discharge/monitor patient progress toward functional and medical goals  Care Tool:  Bathing    Body parts bathed by patient: Chest   Body parts bathed by helper: Right arm, Left arm, Abdomen, Front perineal area, Buttocks, Right upper leg, Left upper leg, Right lower leg,  Left lower leg, Face     Bathing assist Assist Level: 2 Helpers     Upper Body Dressing/Undressing Upper body dressing   What is the patient wearing?: Pull over shirt    Upper body assist Assist Level: Total Assistance - Patient < 25%    Lower Body Dressing/Undressing Lower body dressing      What is the patient wearing?: Underwear/pull up, Pants     Lower body assist Assist for lower body dressing: 2 Helpers     Toileting Toileting    Toileting assist Assist for toileting: Total Assistance - Patient < 25%     Transfers Chair/bed transfer  Transfers assist  Chair/bed transfer activity did not occur: Safety/medical concerns  Chair/bed transfer assist level: 2 Helpers(sliding board)     Locomotion Ambulation   Ambulation assist   Ambulation activity did not occur: Safety/medical concerns          Walk 10 feet activity   Assist  Walk 10 feet activity did not occur: Safety/medical concerns        Walk 50 feet activity   Assist Walk 50 feet with 2 turns activity did not occur: Safety/medical concerns         Walk 150 feet activity   Assist Walk 150 feet activity did not occur: Safety/medical concerns         Walk 10 feet on uneven surface  activity   Assist Walk 10 feet on uneven surfaces activity did not occur: Safety/medical concerns         Wheelchair     Assist Will patient  use wheelchair at discharge?: Yes Type of Wheelchair: Manual    Wheelchair assist level: Supervision/Verbal cueing Max wheelchair distance: 90ft    Wheelchair 50 feet with 2 turns activity    Assist        Assist Level: Supervision/Verbal cueing   Wheelchair 150 feet activity     Assist Wheelchair 150 feet activity did not occur: Safety/medical concerns          Medical Problem List and Plan: 1.  Decreased functional mobility secondary to acute respiratory distress with hypoxic respiratory failure secondary to multi focal  pneumonia in the setting of MS.    Follow-up chest x-ray 12/07/2019 bibasilar pulmonary infiltrates improved from prior exams, repeat chest x-ray on 12/21 personally reviewed showing?  Persistence of infiltrate- monitor at present per Pulm.   Patient completed 10-day course of Augmentin on 12/17  Completed 5-day course of IV Solu-Medrol.    Continue CIR  2.  Antithrombotics: DVT/anticoagulation: Subcutaneous Lovenox.  CT angiogram of chest negative for pulmonary emboli             Antiplatelet therapy: N/A 3. Pain Management/spasticity: Baclofen 5 mg 3 times daily 4. Mood: Provide emotional support  Appreciate neuropsych eval  Fluoxetine started on 12/18, will increase tomorrow             -antipsychotic agents: N/A 5. Neuropsych: This patient is not capable of making decisions on his own behalf. 6. Skin/Wound Care: Routine skin checks/pressure injury to buttocks.  Follow-up skin care checks 7. Fluids/Electrolytes/Nutrition: Routine in and outs 8.  Atrial fibrillation with RVR.  IV Cardizem discontinued due to hypotension.  No current plan for anticoagulation  Monitor with increased mobility. 9.  Neurogenic bowel bladder with recurrent UTIs.  Indwelling Foley catheter tube to remain in place at the recommendations of urology services per plan outpatient evaluation for possible need for suprapubic tube  UA on 12/21 suggestive of infection, urine culture showing Pseudomonas, pansensitive  Cipro started on 12/23, Foley replaced-discussed with nursing 10.  Dysphagia.  Dysphagia #1 nectar liquids.  Follow-up speech therapy.    Advance diet as tolerated. 11.AKI:   Creatinine relatively elevated on 12/22, labs ordered for tomorrow  Cont IVF 12. Spasticity in setting of MS  Baclofen increased to home dose of 10 mg 3 times daily on 12/17, decreased back to 5 mg 3 times daily on 12/19 due to lethargy, will consider further increase back to home 10 mg as lethargy may be related to UTI 13. Steroid  induced hyperglycemia: ?Resolved  Cont to monitor 14.  Hypernatremia  Sodium 141 on 12/21, labs ordered for tomorrow  Cont to monitor 15. Hypoalbuminemia  Supplement initiated on 12/16 16.  Transaminitis  LFTs elevated, but improving on 12/21  Continue to monitor 17. ABLA: Resolved  Hb 13.9 on 12/21  Cont to monitor    LOS: 9 days A FACE TO FACE EVALUATION WAS PERFORMED  Stephen Wade 12/16/2019, 8:39 AM

## 2019-12-16 NOTE — Progress Notes (Signed)
Speech Language Pathology Daily Session Note  Patient Details  Name: CEJAY CAMBRE MRN: [id number] Date of Birth: [birth year]  Today's Date: 12/16/2019 SLP Individual Time: 1430-1502 SLP Individual Time Calculation (min): 32 min  Short Term Goals: Week 2: SLP Short Term Goal 1 (Week 2): Pt will consume Dys 1/nectar diet with Min overt s/sx aspiration and efficient mastication and oral clearance with Supervision A verbal cues for use of swallow strtaegies. SLP Short Term Goal 2 (Week 2): Pt will consume trials of thin H2O via teaspoon with Min A verbal cues for use of swallow strategies and Min overt s/sx aspiration. SLP Short Term Goal 3 (Week 2): Pt will demonstrate ability to manage secretions via throat clear/cough and use of suction with Min A verbal cues. SLP Short Term Goal 4 (Week 2): Pt will demonstrate recall of new/day to day information with Min A verbal/visual cues for use of compensatory strategies and/or external aids. SLP Short Term Goal 5 (Week 2): Pt will demonstrate ability to problem solve during functional mildly complex tasks with Mod A verbal/visual cues. SLP Short Term Goal 6 (Week 2): Pt will use speech intelligibility strategies with Min A verbal cues to achieve 80% intelligibility at the sentence level.  Skilled Therapeutic Interventions: Pt was seen for skilled ST targeting dysphagia and speech goals. SLP facilitated session by answering questions from pt and his wife regarding current diet textures. Discussed foods that would be appropriate to puree and bring in for pt to try since he has limited PO intake and reports taste is primary reason. Discussed swallowing anatomy and pt's oropharyngeal swallow impairments with solids and liquids as well as additional risk factors that place him as moderate/high risk for aspiration. Plan to repeat MBSS next week as long as pt remains medically stable, alert, managing secretions. After oral care via toothbrush with suction pt  consumed ice chips X10 with wet vocal quality X2. During unstructured conversational tasks, pt was ~80% intelligible at the phrase level and required Mod A verbal cues for repetition with increased vocal intensity. He is Mod I for using slow rate, therefore increased vocal intensity and overarticulation are focus of speech intelligibility strategies. Pt left laying in bed with alarm set, needs within reach, wife still present.      Pain Pain Assessment Pain Scale: 0-10 Pain Score: 0-No pain  Therapy/Group: Individual Therapy  Arbutus Leas 12/16/2019, 3:06 PM

## 2019-12-16 NOTE — Progress Notes (Signed)
Occupational Therapy Session Note  Patient Details  Name: GURVIR SCHROM MRN: [id number] Date of Birth: 1951/06/28  Today's Date: 12/16/2019 OT Individual Time: 0930-1020 OT Individual Time Calculation (min): 50 min    Short Term Goals: Week 2:  OT Short Term Goal 1 (Week 2): Pt will don shirt with mod A OT Short Term Goal 2 (Week 2): Pt will maintain EOB sitting balance with mod A OT Short Term Goal 3 (Week 2): Pt will complete LB dressing with max A OT Short Term Goal 4 (Week 2): Pt will complete transfer bed > roll in shower chair with max assist of one caregiver  Skilled Therapeutic Interventions/Progress Updates:    Pt received supine with c/o pain 2/2 spasms in RLE especially. Pt taken through PROM with high resistance/tone. Pt completed rolling R and L with mod A this session for brief change. Some blood around penis where catheter is inserted, with pt reporting foley change yesterday. Max A to don pants and brief at bed level. Pt transitioned to EOB with max +2. Pt required max A to maintain EOB sitting balance. Pt completed UB bathing with no physical assist to thoroughly clean under arms and chest. Pt donned shirt with mod A. Pt completed slideboard transfer to w/c with max A +2. Pt required total A to scoot back in chair. Discussed potential need for TIS w/c for safety with PT. Pt completed oral care at the sink with set up assist. Pt requested to go outside but 2/2 time constraints took pt to dayroom to look out window instead. Pt was left sitting up in the w/c with all needs met. Chair alarm belt fastened.   Therapy Documentation Precautions:  Precautions Precautions: Fall Precaution Comments: hx MS (R LE deficits > L LE, spastic diplegia) Restrictions Weight Bearing Restrictions: No  Therapy/Group: Individual Therapy  Curtis Sites 12/16/2019, 6:19 AM

## 2019-12-16 NOTE — Plan of Care (Signed)
  Problem: RH BLADDER ELIMINATION Goal: RH STG MANAGE BLADDER WITH ASSISTANCE Description: STG Manage Bladder With max Assistance Outcome: Not Progressing; foley

## 2019-12-16 NOTE — Progress Notes (Signed)
Patient coughed a few times during water protocol; reminded to tuck chin; finished about 1/2 cup of ice chips.

## 2019-12-17 LAB — BASIC METABOLIC PANEL
Anion gap: 7 (ref 5–15)
BUN: 19 mg/dL (ref 8–23)
CO2: 25 mmol/L (ref 22–32)
Calcium: 8.9 mg/dL (ref 8.9–10.3)
Chloride: 110 mmol/L (ref 98–111)
Creatinine, Ser: 0.77 mg/dL (ref 0.61–1.24)
GFR calc Af Amer: >60 mL/min (ref >60)
GFR calc non Af Amer: >60 mL/min (ref >60)
Glucose, Bld: 121 mg/dL — ABNORMAL HIGH (ref 70–99)
Potassium: 3.8 mmol/L (ref 3.5–5.1)
Sodium: 142 mmol/L (ref 135–145)

## 2019-12-17 MED ORDER — CIPROFLOXACIN HCL 500 MG PO TABS
500.0000 mg | ORAL_TABLET | Freq: Two times a day (BID) | ORAL | Status: AC
  Administered 2019-12-17 – 2019-12-24 (×15): 500 mg via ORAL
  Filled 2019-12-17 (×15): qty 1

## 2019-12-17 MED ORDER — FLUOXETINE HCL 20 MG PO CAPS
20.0000 mg | ORAL_CAPSULE | Freq: Every day | ORAL | Status: DC
  Administered 2019-12-18 – 2019-12-25 (×8): 20 mg via ORAL
  Filled 2019-12-17 (×11): qty 1

## 2019-12-17 NOTE — Progress Notes (Signed)
Pharmacy Antibiotic Note  Stephen Wade is a 68 y.o. male admitted on 12/07/2019 with pseudonomas UTI.  Pharmacy has been consulted for ciprofloxacin dosing.  Culture + pan sensitive pseudomonas. WBC wnl. CrCl >60 ml/min. Foley will be replaced in the evening after PT per RN.  Plan: - Ciprofloxacin 500mg  PO Q12hr  - Considered a complicated UTI will treat for a total of 10 days (Stop date placed)  - Renal function appears stable/ improving  - Pharmacy will sign off at this time   Height: 6' (182.9 cm) Weight: 195 lb 1.7 oz (88.5 kg) IBW/kg (Calculated) : 77.6  Temp (24hrs), Avg:98.2 F (36.8 C), Min:97.8 F (36.6 C), Max:98.5 F (36.9 C)  Recent Labs  Lab 12/13/19 0610 12/13/19 1203 12/14/19 0538  WBC  --  9.0  --   CREATININE 0.75  --  1.23  LATICACIDVEN  --  0.8  --     Estimated Creatinine Clearance: 64.8 mL/min (by C-G formula based on SCr of 1.23 mg/dL).    No Known Allergies  Antimicrobials this admission: cipro 12/23 >> 1/3  Microbiology results: 12/21 UCx: pseudonomas PanS   Thank you for allowing pharmacy to be a part of this patient's care.  Duanne Limerick, PharmD, BCPS Clinical Pharmacist  Please check AMION for all [place] phone numbers After 10:00 PM, call [place] [phone]

## 2019-12-17 NOTE — Progress Notes (Signed)
[place] PHYSICAL MEDICINE & REHABILITATION PROGRESS NOTE  Subjective/Complaints: Patient seen sitting up in bed this morning.  States he slept well overnight.?  Some improvement in tone.  ROS: Denies CP, SOB, N/V/D  Objective: Vital Signs: Blood pressure 115/75, pulse 80, temperature 98.5 F (36.9 C), resp. rate 17, height 6' (1.829 m), weight 88.5 kg, SpO2 94 %. No results found. No results for input(s): WBC, HGB, HCT, PLT in the last 72 hours. No results for input(s): NA, K, CL, CO2, GLUCOSE, BUN, CREATININE, CALCIUM in the last 72 hours.  Physical Exam: BP 115/75 (BP Location: Right Arm)   Pulse 80   Temp 98.5 F (36.9 C)   Resp 17   Ht 6' (1.829 m)   Wt 88.5 kg   SpO2 94%   BMI 26.46 kg/m   Constitutional: No distress . Vital signs reviewed. HENT: Normocephalic.  Atraumatic. Eyes: EOMI. No discharge. Cardiovascular: No JVD. Respiratory: Normal effort.  No stridor. GI: Non-distended. Skin: Warm and dry.  Intact. Psych: Slowed.  Flat. Musc: Lower extremity edema. GU: + Foley. Neuro: Alert Dysarthria, unchanged Motor: Bilateral upper extremities: 4+-5/5 proximal distal Right lower extremity: 1/5 proximal distal, unchanged Left lower extremity: Hip flexion, knee extension 1+/5, ankle dorsiflexion 1+/5, unchanged Increased tone in bilateral lower extremities, ?  With some improvement  Assessment/Plan: 1. Functional deficits secondary to debility which require 3+ hours per day of interdisciplinary therapy in a comprehensive inpatient rehab setting.  Physiatrist is providing close team supervision and 24 hour management of active medical problems listed below.  Physiatrist and rehab team continue to assess barriers to discharge/monitor patient progress toward functional and medical goals  Care Tool:  Bathing    Body parts bathed by patient: Chest   Body parts bathed by helper: Right arm, Left arm, Abdomen, Front perineal area, Buttocks, Right upper leg, Left  upper leg, Right lower leg, Left lower leg, Face     Bathing assist Assist Level: 2 Helpers     Upper Body Dressing/Undressing Upper body dressing   What is the patient wearing?: Pull over shirt    Upper body assist Assist Level: Total Assistance - Patient < 25%    Lower Body Dressing/Undressing Lower body dressing      What is the patient wearing?: Underwear/pull up, Pants     Lower body assist Assist for lower body dressing: 2 Helpers     Toileting Toileting    Toileting assist Assist for toileting: Total Assistance - Patient < 25%     Transfers Chair/bed transfer  Transfers assist  Chair/bed transfer activity did not occur: Safety/medical concerns  Chair/bed transfer assist level: 2 Helpers     Locomotion Ambulation   Ambulation assist   Ambulation activity did not occur: Safety/medical concerns          Walk 10 feet activity   Assist  Walk 10 feet activity did not occur: Safety/medical concerns        Walk 50 feet activity   Assist Walk 50 feet with 2 turns activity did not occur: Safety/medical concerns         Walk 150 feet activity   Assist Walk 150 feet activity did not occur: Safety/medical concerns         Walk 10 feet on uneven surface  activity   Assist Walk 10 feet on uneven surfaces activity did not occur: Safety/medical concerns         Wheelchair     Assist Will patient use wheelchair at discharge?: Yes  Type of Wheelchair: Manual    Wheelchair assist level: Supervision/Verbal cueing Max wheelchair distance: 20ft    Wheelchair 50 feet with 2 turns activity    Assist        Assist Level: Supervision/Verbal cueing   Wheelchair 150 feet activity     Assist Wheelchair 150 feet activity did not occur: Safety/medical concerns          Medical Problem List and Plan: 1.  Decreased functional mobility secondary to acute respiratory distress with hypoxic respiratory failure secondary to  multi focal pneumonia in the setting of MS.    Follow-up chest x-ray 12/07/2019 bibasilar pulmonary infiltrates improved from prior exams, repeat chest x-ray on 12/21 personally reviewed showing?  Persistence of infiltrate- monitor at present per Pulm.   Patient completed 10-day course of Augmentin on 12/17  Completed 5-day course of IV Solu-Medrol.    Continue CIR  2.  Antithrombotics: DVT/anticoagulation: Subcutaneous Lovenox.  CT angiogram of chest negative for pulmonary emboli             Antiplatelet therapy: N/A 3. Pain Management/spasticity: Baclofen 5 mg 3 times daily 4. Mood: Provide emotional support  Appreciate neuropsych eval  Fluoxetine started on 12/18, increased on 12/26             -antipsychotic agents: N/A 5. Neuropsych: This patient is not capable of making decisions on his own behalf. 6. Skin/Wound Care: Routine skin checks/pressure injury to buttocks.  Follow-up skin care checks 7. Fluids/Electrolytes/Nutrition: Routine in and outs 8.  Atrial fibrillation with RVR.  IV Cardizem discontinued due to hypotension.  No current plan for anticoagulation  Monitor with increased mobility. 9.  Neurogenic bowel bladder with recurrent UTIs.  Indwelling Foley catheter tube to remain in place at the recommendations of urology services per plan outpatient evaluation for possible need for suprapubic tube  UA on 12/21 suggestive of infection, urine culture showing Pseudomonas, pansensitive  Ciprofloxacin started on 12/23, Foley replaced-discussed with nursing 10.  Dysphagia.  Dysphagia #1 nectar liquids.  Follow-up speech therapy.    Advance diet as tolerated. 11.AKI:   Creatinine relatively elevated on 12/22, labs pending  Cont IVF 12. Spasticity in setting of MS  Baclofen increased to home dose of 10 mg 3 times daily on 12/17, decreased back to 5 mg 3 times daily on 12/19 due to lethargy, will consider further increase back to home 10 mg as lethargy may be related to UTI  ?   Improving 13. Steroid induced hyperglycemia: ?Resolved  Cont to monitor 14.  Hypernatremia  Sodium 141 on 12/21, labs pending  Cont to monitor 15. Hypoalbuminemia  Supplement initiated on 12/16 16.  Transaminitis  LFTs elevated, but improving on 12/21  Continue to monitor 17. ABLA: Resolved  Hb 13.9 on 12/21  Cont to monitor    LOS: 10 days A FACE TO FACE EVALUATION WAS PERFORMED  Ankit Lorie Phenix 12/17/2019, 1:05 PM

## 2019-12-17 NOTE — Plan of Care (Signed)
  Problem: Consults Goal: RH GENERAL PATIENT EDUCATION Description: See Patient Education module for education specifics. Outcome: Progressing   Problem: RH BOWEL ELIMINATION Goal: RH STG MANAGE BOWEL WITH ASSISTANCE Description: STG Manage Bowel with Min Assistance. Outcome: Progressing Goal: RH STG MANAGE BOWEL W/MEDICATION W/ASSISTANCE Description: STG Manage Bowel with Medication with Min Assistance. Outcome: Progressing   Problem: RH BLADDER ELIMINATION Goal: RH STG MANAGE BLADDER WITH ASSISTANCE Description: STG Manage Bladder With max Assistance Outcome: Progressing   Problem: RH SKIN INTEGRITY Goal: RH STG SKIN FREE OF INFECTION/BREAKDOWN Description: Min assist Outcome: Progressing Goal: RH STG MAINTAIN SKIN INTEGRITY WITH ASSISTANCE Description: STG Maintain Skin Integrity With Min Assistance. Outcome: Progressing   Problem: RH SAFETY Goal: RH STG ADHERE TO SAFETY PRECAUTIONS W/ASSISTANCE/DEVICE Description: STG Adhere to Safety Precautions With Min Assistance/Device. Outcome: Progressing Goal: RH STG DECREASED RISK OF FALL WITH ASSISTANCE Description: STG Decreased Risk of Fall With Min Assistance. Outcome: Progressing   

## 2019-12-18 ENCOUNTER — Inpatient Hospital Stay (HOSPITAL_COMMUNITY): Payer: Medicare Other

## 2019-12-18 ENCOUNTER — Inpatient Hospital Stay (HOSPITAL_COMMUNITY): Payer: Medicare Other | Admitting: Occupational Therapy

## 2019-12-18 NOTE — Progress Notes (Signed)
[place] PHYSICAL MEDICINE & REHABILITATION PROGRESS NOTE  Subjective/Complaints: Patient seen lying down in bed this morning. Tearful, states that he has tried but that he can't do this anymore and he no longer wants to live.  Received positive encouragement from myself, therapy, and nurse.   ROS: Denies CP, SOB, N/V/D  Objective: Vital Signs: Blood pressure 130/66, pulse 89, temperature 99 F (37.2 C), temperature source Oral, resp. rate 16, height 6' (1.829 m), weight 88.5 kg, SpO2 96 %. No results found. No results for input(s): WBC, HGB, HCT, PLT in the last 72 hours. Recent Labs    12/17/19 1455  NA 142  K 3.8  CL 110  CO2 25  GLUCOSE 121*  BUN 19  CREATININE 0.77  CALCIUM 8.9    Physical Exam: BP 130/66 (BP Location: Right Arm)   Pulse 89   Temp 99 F (37.2 C) (Oral)   Resp 16   Ht 6' (1.829 m)   Wt 88.5 kg   SpO2 96%   BMI 26.46 kg/m   Constitutional: No distress . Vital signs reviewed. HENT: Normocephalic.  Atraumatic. Eyes: EOMI. No discharge. Cardiovascular: No JVD. Respiratory: Normal effort.  No stridor. GI: Non-distended. Skin: Warm and dry.  Intact. Psych: Slowed.  Flat. Musc: Lower extremity edema. GU: + Foley. Neuro: Alert Dysarthria, unchanged Motor: Bilateral upper extremities: 4+-5/5 proximal distal Right lower extremity: 1/5 proximal distal, unchanged Left lower extremity: Hip flexion, knee extension 1+/5, ankle dorsiflexion 1+/5, unchanged Increased tone in bilateral lower extremities, ?  With some improvement  Assessment/Plan: 1. Functional deficits secondary to debility which require 3+ hours per day of interdisciplinary therapy in a comprehensive inpatient rehab setting.  Physiatrist is providing close team supervision and 24 hour management of active medical problems listed below.  Physiatrist and rehab team continue to assess barriers to discharge/monitor patient progress toward functional and medical goals  Care  Tool:  Bathing    Body parts bathed by patient: Chest   Body parts bathed by helper: Right arm, Left arm, Abdomen, Front perineal area, Buttocks, Right upper leg, Left upper leg, Right lower leg, Left lower leg, Face     Bathing assist Assist Level: 2 Helpers     Upper Body Dressing/Undressing Upper body dressing   What is the patient wearing?: Pull over shirt    Upper body assist Assist Level: Total Assistance - Patient < 25%    Lower Body Dressing/Undressing Lower body dressing      What is the patient wearing?: Underwear/pull up, Pants     Lower body assist Assist for lower body dressing: 2 Helpers     Toileting Toileting    Toileting assist Assist for toileting: Total Assistance - Patient < 25%     Transfers Chair/bed transfer  Transfers assist  Chair/bed transfer activity did not occur: Safety/medical concerns  Chair/bed transfer assist level: 2 Helpers     Locomotion Ambulation   Ambulation assist   Ambulation activity did not occur: Safety/medical concerns          Walk 10 feet activity   Assist  Walk 10 feet activity did not occur: Safety/medical concerns        Walk 50 feet activity   Assist Walk 50 feet with 2 turns activity did not occur: Safety/medical concerns         Walk 150 feet activity   Assist Walk 150 feet activity did not occur: Safety/medical concerns         Walk 10 feet on uneven  surface  activity   Assist Walk 10 feet on uneven surfaces activity did not occur: Safety/medical concerns         Wheelchair     Assist Will patient use wheelchair at discharge?: Yes Type of Wheelchair: Manual    Wheelchair assist level: Supervision/Verbal cueing Max wheelchair distance: 39ft    Wheelchair 50 feet with 2 turns activity    Assist        Assist Level: Supervision/Verbal cueing   Wheelchair 150 feet activity     Assist Wheelchair 150 feet activity did not occur: Safety/medical  concerns          Medical Problem List and Plan: 1.  Decreased functional mobility secondary to acute respiratory distress with hypoxic respiratory failure secondary to multi focal pneumonia in the setting of MS.    Follow-up chest x-ray 12/07/2019 bibasilar pulmonary infiltrates improved from prior exams, repeat chest x-ray on 12/21 personally reviewed showing?  Persistence of infiltrate- monitor at present per Pulm.   Patient completed 10-day course of Augmentin on 12/17  Completed 5-day course of IV Solu-Medrol.    Continue CIR  2.  Antithrombotics: DVT/anticoagulation: Subcutaneous Lovenox.  CT angiogram of chest negative for pulmonary emboli             Antiplatelet therapy: N/A 3. Pain Management/spasticity: Baclofen 5 mg 3 times daily 4. Mood: Provide emotional support  Appreciate neuropsych eval  Fluoxetine started on 12/18, increased on 12/26             -antipsychotic agents: N/A 5. Neuropsych: This patient is not capable of making decisions on his own behalf. 6. Skin/Wound Care: Routine skin checks/pressure injury to buttocks.  Follow-up skin care checks 7. Fluids/Electrolytes/Nutrition: Routine in and outs 8.  Atrial fibrillation with RVR.  IV Cardizem discontinued due to hypotension.  No current plan for anticoagulation  Monitor with increased mobility. 9.  Neurogenic bowel bladder with recurrent UTIs.  Indwelling Foley catheter tube to remain in place at the recommendations of urology services per plan outpatient evaluation for possible need for suprapubic tube  UA on 12/21 suggestive of infection, urine culture showing Pseudomonas, pansensitive  Ciprofloxacin started on 12/23, Foley replaced-discussed with nursing 10.  Dysphagia.  Dysphagia #1 nectar liquids.  Follow-up speech therapy.    Advance diet as tolerated.  12/26: Very frustrated by diet 11.AKI:   Creatinine relatively elevated on 12/22, labs pending  Cont IVF 12. Spasticity in setting of MS  Baclofen  increased to home dose of 10 mg 3 times daily on 12/17, decreased back to 5 mg 3 times daily on 12/19 due to lethargy, will consider further increase back to home 10 mg as lethargy may be related to UTI  ?  Improving 13. Steroid induced hyperglycemia: ?Resolved  Cont to monitor 14.  Hypernatremia  Sodium 141 on 12/21, labs pending  Cont to monitor 15. Hypoalbuminemia  Supplement initiated on 12/16 16.  Transaminitis  LFTs elevated, but improving on 12/21  Continue to monitor 17. ABLA: Resolved  Hb 13.9 on 12/21  Cont to monitor 18. GOC: Will obtain palliative care follow-up consult (he has been seen in acute care on 12/9--at this time was more hopeful about his care). Patient has since stated multiple times "I'm ready to go to the other side" and "I've given this all I can".     LOS: 11 days A FACE TO FACE EVALUATION WAS PERFORMED  Drema Pry Shery [place] 12/18/2019, 1:38 PM

## 2019-12-18 NOTE — Progress Notes (Signed)
Occupational Therapy Session Note  Patient Details  Name: Stephen Wade MRN: [id number] Date of Birth: 10/11/1951  Today's Date: 12/18/2019 OT Individual Time: 4098-1191 OT Individual Time Calculation (min): 39 min  and Today's Date: 12/18/2019 OT Missed Time: 21 Minutes Missed Time Reason: Patient fatigue   Short Term Goals: Week 2:  OT Short Term Goal 1 (Week 2): Pt will don shirt with mod A OT Short Term Goal 2 (Week 2): Pt will maintain EOB sitting balance with mod A OT Short Term Goal 3 (Week 2): Pt will complete LB dressing with max A OT Short Term Goal 4 (Week 2): Pt will complete transfer bed > roll in shower chair with max assist of one caregiver  Skilled Therapeutic Interventions/Progress Updates:    Limited treatment session as pt stating that he feels that he is "at the end" and that he is "ready to go to the other side".  Spoke at length with pt and his wife about pt PLOF and his/hers current goals.  Pt stating that he is "so tired" and doesn't have the energy.  Encouraged pt and wife to discuss their goals for his rehab stay as well as the remainder of his life (whether it be short or long).  Encouraged discussion with MD, neuropsych, and palliative care (if appropriate) to fully understand medical issues and life expectancy.  Discussed use of various equipment both on rehab and at home to allow pt to get OOB and participate in whatever motivates him.  Pt agreeable, but stating too tired for today.  Pt missed remaining 21 mins due to fatigue, falling asleep during end of conversation.  Plan to utilize hoyer/Maximove to get OOB next session to allow for energy expenditure on activities when OOB.    Therapy Documentation Precautions:  Precautions Precautions: Fall Precaution Comments: hx MS (R LE deficits > L LE, spastic diplegia) Restrictions Weight Bearing Restrictions: No General: General OT Amount of Missed Time: 21 Minutes Vital Signs: Therapy Vitals Temp: (!) 97.5  F (36.4 C) Pulse Rate: 77 Resp: 18 BP: (!) 117/59 Patient Position (if appropriate): Lying Oxygen Therapy SpO2: 95 % O2 Device: Room Air Pain:  Pt with no c/o pain   Therapy/Group: Individual Therapy  Simonne Come 12/18/2019, 3:26 PM

## 2019-12-18 NOTE — Plan of Care (Signed)
  Problem: RH BOWEL ELIMINATION Goal: RH STG MANAGE BOWEL WITH ASSISTANCE Description: STG Manage Bowel with Min Assistance. Outcome: Not Progressing; LBM 12/23   Problem: RH BLADDER ELIMINATION Goal: RH STG MANAGE BLADDER WITH ASSISTANCE Description: STG Manage Bladder With max Assistance Outcome: Not Progressing; chronic foley

## 2019-12-18 NOTE — Progress Notes (Signed)
Physical Therapy Session Note  Patient Details  Name: Stephen Wade MRN: [id number] Date of Birth: May 04, 1951  Today's Date: 12/18/2019 PT Individual Time: 0900-1014 PT Individual Time Calculation (min): 74 min   Short Term Goals: Week 1:  PT Short Term Goal 1 (Week 1): Pt will perform bed mobility with mod assist PT Short Term Goal 1 - Progress (Week 1): Progressing toward goal PT Short Term Goal 2 (Week 1): Pt will transfer to [facility] with mod assist PT Short Term Goal 2 - Progress (Week 1): Progressing toward goal PT Short Term Goal 3 (Week 1): Pt will perform sit<>stand with mod assist and BUE support on rail or in stedy PT Short Term Goal 3 - Progress (Week 1): Progressing toward goal PT Short Term Goal 4 (Week 1): Pt will propell WC >143f with supevision assist PT Short Term Goal 4 - Progress (Week 1): Progressing toward goal PT Short Term Goal 5 (Week 1): Pt will maintain sittng balance EOB with mod assist up to 5 min to prepare for use of power WC/scooter. PT Short Term Goal 5 - Progress (Week 1): Met Week 2:  PT Short Term Goal 1 (Week 2): Pt will perform bed mobility with mod assist PT Short Term Goal 2 (Week 2): [facility] Short Term Goal 3 (Week 2): Pt will perform sit<>stand with mod assist and BUE support on rail or in stedy PT Short Term Goal 4 (Week 2): Pt will propel WC >1511fwith supevision assist  Skilled Therapeutic Interventions/Progress Updates:   Received pt supine in bed, pt agreeable to therapy, and denied any pain throughout session. Session focused on functional mobility/transfers, dynamic sitting balance, postural control and alignment, LE strength, stretching, and improved tolerance to activity. Pt continues to get emotional and appears depressed during sessions stating multiple times "I'm ready to go to the other side" and "i've given this all I can". Therapist provided emotional support and encouragement throughout session. MD  present during session and made aware and suggesting neuropsych and palliative care consults. Pt rolled to R max A and transferred supine<>sitting EOB +2 assist for trunk control and LE management. Pt required max A to maintain sitting balance. Attempted slideboard transfer from bed<>WC however unsuccessful due to decreased strength, poor trunk control, and decreased initiation of movement. Pt stated "I just can't help you, i'm sorry". Pt with poor hip placement on bed resulting in repositioning and transfer from sit<>supine. Pt refused to attempt bed<>WC transfer again stating "I just can't do it." Pt agreeable to bed level exercise. Pt practiced bed mobility rolling to L and R max A with use of bedrails. Pt with improved technique and attempting to position LE's in hooklying prior to rolling. Therapist performed passive ROM of bilateral hips into flexion and abduction 5x30 second hold. Pt initially with increased facial grimacing but reporting that he felt much better after the stretch. Pt performed 3x5 reps using blower per SLP requests in room. Pt demonstrated increased difficulty with activity and required rest breaks in between trials. Therapist performed passive bilateral hamstring stretch 3x30 second hold with LE supported on therapist shoulder. Pt reported significant decrease in tightness after stretching. Pt transferred R sidelying<>sitting EOB x trial 2 with +2 assist. Pt performed dynamic reaching to touch therapist's hands with bilateral UE support max A for seated balance. Pt worked on static trunk control with bilateral UE support on bed and with hands in lap max A. Pt with decreased postural  control with increased fatigue and transitioned sit<>supine max A. Therapist performed passive bilateral piriformis stretch and pt reported relief. Pt fatigued at end of session and demonstrated difficulty keeping eyes open. Concluded session with pt supine in bed, needs within reach, and bed alarm on.     Therapy Documentation Precautions:  Precautions Precautions: Fall Precaution Comments: hx MS (R LE deficits > L LE, spastic diplegia) Restrictions Weight Bearing Restrictions: No   Therapy/Group: Individual Therapy Alfonse Alpers PT, DPT    12/18/2019, 7:33 AM

## 2019-12-18 NOTE — Progress Notes (Signed)
Patient refused to eat meals but drinks ensure; Patient is depressed re: condition ; refuses to take meds this morning claims "what's the use I cannot fight this MS" RN encouraged and explained the meds he is taking and finally took it. MD notified.

## 2019-12-19 ENCOUNTER — Inpatient Hospital Stay (HOSPITAL_COMMUNITY): Payer: Medicare Other | Admitting: Physical Therapy

## 2019-12-19 MED ORDER — POLYETHYLENE GLYCOL 3350 17 G PO PACK
17.0000 g | PACK | Freq: Every day | ORAL | Status: DC
  Administered 2019-12-20 – 2019-12-23 (×3): 17 g via ORAL
  Filled 2019-12-19 (×10): qty 1

## 2019-12-19 NOTE — Progress Notes (Signed)
LBM 12/23. Per day-shift report, pt only took small sip of PRN sorbitol. Pt compliant with scheduled senokot. Pt educated on possible complications, will continue to monitor.

## 2019-12-19 NOTE — Progress Notes (Signed)
[place] PHYSICAL MEDICINE & REHABILITATION PROGRESS NOTE  Subjective/Complaints: Patient seen lying down in bed this morning.  Sleeping, calm, not tearful like yesterday Therapy had further conversations with patient and his wife yesterday and they are in agreement with palliative care consult. Consult placed yesterday. Has not had BM, agreeable to stool softener.   ROS: Denies CP, SOB, N/V/D  Objective: Vital Signs: Blood pressure 113/73, pulse 84, temperature 98.7 F (37.1 C), resp. rate 16, height 6' (1.829 m), weight 88.5 kg, SpO2 93 %. No results found. No results for input(s): WBC, HGB, HCT, PLT in the last 72 hours. Recent Labs    12/17/19 1455  NA 142  K 3.8  CL 110  CO2 25  GLUCOSE 121*  BUN 19  CREATININE 0.77  CALCIUM 8.9    Physical Exam: BP 113/73 (BP Location: Right Arm)   Pulse 84   Temp 98.7 F (37.1 C)   Resp 16   Ht 6' (1.829 m)   Wt 88.5 kg   SpO2 93%   BMI 26.46 kg/m   Constitutional: No distress . Vital signs reviewed. HENT: Normocephalic.  Atraumatic. Eyes: EOMI. No discharge. Cardiovascular: No JVD. Respiratory: Normal effort.  No stridor. GI: Non-distended. Skin: Warm and dry.  Intact. Psych: Slowed.  Flat. Musc: Lower extremity edema. GU: + Foley. Neuro: Alert Dysarthria, unchanged Motor: Bilateral upper extremities: 4+-5/5 proximal distal Right lower extremity: 1/5 proximal distal, unchanged Left lower extremity: Hip flexion, knee extension 1+/5, ankle dorsiflexion 1+/5, unchanged Increased tone in bilateral lower extremities, ?  With some improvement  Assessment/Plan: 1. Functional deficits secondary to debility which require 3+ hours per day of interdisciplinary therapy in a comprehensive inpatient rehab setting.  Physiatrist is providing close team supervision and 24 hour management of active medical problems listed below.  Physiatrist and rehab team continue to assess barriers to discharge/monitor patient progress toward  functional and medical goals  Care Tool:  Bathing    Body parts bathed by patient: Chest   Body parts bathed by helper: Right arm, Left arm, Abdomen, Front perineal area, Buttocks, Right upper leg, Left upper leg, Right lower leg, Left lower leg, Face     Bathing assist Assist Level: 2 Helpers     Upper Body Dressing/Undressing Upper body dressing   What is the patient wearing?: Pull over shirt    Upper body assist Assist Level: Total Assistance - Patient < 25%    Lower Body Dressing/Undressing Lower body dressing      What is the patient wearing?: Underwear/pull up, Pants     Lower body assist Assist for lower body dressing: 2 Helpers     Toileting Toileting    Toileting assist Assist for toileting: Total Assistance - Patient < 25%     Transfers Chair/bed transfer  Transfers assist  Chair/bed transfer activity did not occur: Safety/medical concerns  Chair/bed transfer assist level: 2 Helpers     Locomotion Ambulation   Ambulation assist   Ambulation activity did not occur: Safety/medical concerns          Walk 10 feet activity   Assist  Walk 10 feet activity did not occur: Safety/medical concerns        Walk 50 feet activity   Assist Walk 50 feet with 2 turns activity did not occur: Safety/medical concerns         Walk 150 feet activity   Assist Walk 150 feet activity did not occur: Safety/medical concerns         Walk  10 feet on uneven surface  activity   Assist Walk 10 feet on uneven surfaces activity did not occur: Safety/medical concerns         Wheelchair     Assist Will patient use wheelchair at discharge?: Yes Type of Wheelchair: Manual    Wheelchair assist level: Supervision/Verbal cueing Max wheelchair distance: 63ft    Wheelchair 50 feet with 2 turns activity    Assist        Assist Level: Supervision/Verbal cueing   Wheelchair 150 feet activity     Assist Wheelchair 150 feet activity  did not occur: Safety/medical concerns          Medical Problem List and Plan: 1.  Decreased functional mobility secondary to acute respiratory distress with hypoxic respiratory failure secondary to multi focal pneumonia in the setting of MS.    Follow-up chest x-ray 12/07/2019 bibasilar pulmonary infiltrates improved from prior exams, repeat chest x-ray on 12/21 personally reviewed showing?  Persistence of infiltrate- monitor at present per Pulm.   Patient completed 10-day course of Augmentin on 12/17  Completed 5-day course of IV Solu-Medrol.    Continue CIR  2.  Antithrombotics: DVT/anticoagulation: Subcutaneous Lovenox.  CT angiogram of chest negative for pulmonary emboli             Antiplatelet therapy: N/A 3. Pain Management/spasticity: Baclofen 5 mg 3 times daily 4. Mood: Provide emotional support  Appreciate neuropsych eval  Fluoxetine started on 12/18, increased on 12/26             -antipsychotic agents: N/A 5. Neuropsych: This patient is not capable of making decisions on his own behalf. 6. Skin/Wound Care: Routine skin checks/pressure injury to buttocks.  Follow-up skin care checks 7. Fluids/Electrolytes/Nutrition: Routine in and outs 8.  Atrial fibrillation with RVR.  IV Cardizem discontinued due to hypotension.  No current plan for anticoagulation  Monitor with increased mobility. 9.  Neurogenic bowel bladder with recurrent UTIs.  Indwelling Foley catheter tube to remain in place at the recommendations of urology services per plan outpatient evaluation for possible need for suprapubic tube  UA on 12/21 suggestive of infection, urine culture showing Pseudomonas, pansensitive  Ciprofloxacin started on 12/23, Foley replaced-discussed with nursing 10.  Dysphagia.  Dysphagia #1 nectar liquids.  Follow-up speech therapy.    Advance diet as tolerated.  12/26: Very frustrated by diet 11.AKI:   Creatinine relatively elevated on 12/22, labs pending  Cont IVF 12. Spasticity  in setting of MS  Baclofen increased to home dose of 10 mg 3 times daily on 12/17, decreased back to 5 mg 3 times daily on 12/19 due to lethargy, will consider further increase back to home 10 mg as lethargy may be related to UTI  ?  Improving 13. Steroid induced hyperglycemia: ?Resolved  Cont to monitor 14.  Hypernatremia  Sodium 141 on 12/21, labs pending  Cont to monitor 15. Hypoalbuminemia  Supplement initiated on 12/16 16.  Transaminitis  LFTs elevated, but improving on 12/21  Continue to monitor 17. ABLA: Resolved  Hb 13.9 on 12/21  Cont to monitor 18. GOC: 12/26: Will obtain palliative care follow-up consult (he has been seen in acute care on 12/9--at this time was more hopeful about his care). Patient has since stated multiple times "I'm ready to go to the other side" and "I've given this all I can".  19. Constipation  12/27: Is receiving scheduled senna-docusate BID. Not receiving prn Miralax. I will schedule this.     LOS: 12 days  A FACE TO FACE EVALUATION WAS PERFORMED  Drema Pry Rilen Shukla 12/19/2019, 1:07 PM

## 2019-12-19 NOTE — Progress Notes (Signed)
Physical Therapy Session Note  Patient Details  Name: Stephen Wade MRN: [id number] Date of Birth: 1951-06-30  Today's Date: 12/19/2019 PT Individual Time: 0900-0954 PT Individual Time Calculation (min): 54 min   Short Term Goals: Week 1:  PT Short Term Goal 1 (Week 1): Pt will perform bed mobility with mod assist PT Short Term Goal 1 - Progress (Week 1): Progressing toward goal PT Short Term Goal 2 (Week 1): Pt will transfer to [facility] with mod assist PT Short Term Goal 2 - Progress (Week 1): Progressing toward goal PT Short Term Goal 3 (Week 1): Pt will perform sit<>stand with mod assist and BUE support on rail or in stedy PT Short Term Goal 3 - Progress (Week 1): Progressing toward goal PT Short Term Goal 4 (Week 1): Pt will propell WC >144f with supevision assist PT Short Term Goal 4 - Progress (Week 1): Progressing toward goal PT Short Term Goal 5 (Week 1): Pt will maintain sittng balance EOB with mod assist up to 5 min to prepare for use of power WC/scooter. PT Short Term Goal 5 - Progress (Week 1): Met Week 2:  PT Short Term Goal 1 (Week 2): Pt will perform bed mobility with mod assist PT Short Term Goal 2 (Week 2): [facility] Short Term Goal 3 (Week 2): Pt will perform sit<>stand with mod assist and BUE support on rail or in stedy PT Short Term Goal 4 (Week 2): Pt will propel WC >1577fwith supevision assist  Skilled Therapeutic Interventions/Progress Updates:   Received pt supine in bed, pt agreeable to therapy, and denied any pain during today's session. Session focused on functional mobility/transfers, LE strength and stretching, bed mobility, trunk control and postural alignment, and improved endurance with activity. Therapist attempted to locate sling for HoHarrel Lemonhowever unsuccessful. Discussed with RN ordering sling for pt. NT found temporary sling and delivered it to room at end of session. PT donned non-skid socks with total assist. Pt transferred  supine<>sitting EOB +2 helpers. Pt required max A-min A to maintain sitting balance. Pt worked on static sitting balance with and without UE support with verbal cues for erect posture and to avoid pulling on bedrails. Pt performed seated ball toss with therapy tech 3x30 reps mod A for balance. Pt with improvements in weight shifting to catch and throw ball however fatigued easily. Pt requires multiple rest breaks leaning back on therapist to avoid laying back down. Pt with excessive rounded shoulders and forward head posture throughout session. Pt performed 2x5 scapular squeezes with verbal and tactile cues for technique and posture, however pt demonstrated poor carry over. Pt attempted to kick beach ball with LLE while sitting EOB however, unable to achieve muscle activation. Pt able to perform active assisted heel slide on LLE x 4 reps but stopped due to fatigue. Pt performed dynamic reaching outside of BOS to target of therapist's hand 3x4 reps mod/max A. Pt required verbal cues for posture and weight shifting. Pt with increased fatigue requiring additional rest breaks. Pt reported urge to have BM and transferred sit<>supine +2 assist and rolled to L and R max A, requiring +2 assist to position bedpan. Pt with extended time on bedpan however unsuccessful. Therapist performed bilateral PROM into hip flexion, abduction, adduction 3x30 second hold. Therapist performed passive hamstring stretch with LE supported on therapist's shoulder 2x30 second hold. Pt reported relief however demonstrated increased fatigue. Pt requiring max verbal cues to remain awake at end of  session. Therapist assisted with repositioning and provided pillow for pressure relief. Concluded session with pt supine in bed, needs within reach, and bed alarm on.   Therapy Documentation Precautions:  Precautions Precautions: Fall Precaution Comments: hx MS (R LE deficits > L LE, spastic diplegia) Restrictions Weight Bearing Restrictions:  No   Therapy/Group: Individual Therapy Alfonse Alpers PT, DPT   12/19/2019, 7:30 AM

## 2019-12-19 NOTE — Progress Notes (Signed)
Occupational Therapy Session Note  Patient Details  Name: Stephen Wade MRN: [id number] Date of Birth: 01-22-1951  Today's Date: 12/19/2019 OT Individual Time: 1240-1356 OT Individual Time Calculation (min): 76 min    Short Term Goals: Week 2:  OT Short Term Goal 1 (Week 2): Pt will don shirt with mod A OT Short Term Goal 2 (Week 2): Pt will maintain EOB sitting balance with mod A OT Short Term Goal 3 (Week 2): Pt will complete LB dressing with max A OT Short Term Goal 4 (Week 2): Pt will complete transfer bed > roll in shower chair with max assist of one caregiver  Skilled Therapeutic Interventions/Progress Updates: particiaptionas follows: Bed mobility total A x2.   He stated she was too fatigued to stay OOB Static and dynamic sitting balance for ADLs while washing UB,  intially was max A x2 and decreased to moderate assistance x1  UB bathine sitting edge of bed=moderate asisstance for bajlance and thoroughness;   UB dressing=min A and multiple cues for topgraphical orientationand to thread arms into sleeves instead of neck hole LB bathing andressing in bed = total A x2.  Patient too fatigued to complete  RN came in to assess reddened area on buttocks and change bandage.  Patient required multiple rest breaks and extra time to process languege  Patient left right side lyilng at the end of the session iwht supportive wife present  Continue Ot plan of care     Therapy Documentation Precautions:  Precautions Precautions: Fall Precaution Comments: hx MS (R LE deficits > L LE, spastic diplegia) Restrictions Weight Bearing Restrictions: No  Pain:"ouch when either leg was moved or repositioned."  Voice no need for meds   Therapy/Group: Individual Therapy  Alfredia Ferguson [facility] 12/19/2019, 3:57 PM

## 2019-12-19 NOTE — Plan of Care (Signed)
  Problem: RH BOWEL ELIMINATION Goal: RH STG MANAGE BOWEL WITH ASSISTANCE Description: STG Manage Bowel with Min Assistance. Outcome: Not Progressing; LBM 12/23; patient refusing laxatives; poor appetite; RN educated patient and says he will take it in pm. Problem: RH BLADDER ELIMINATION Goal: RH STG MANAGE BLADDER WITH ASSISTANCE Description: STG Manage Bladder With max Assistance Outcome: Not Progressing; chronic foley; wife was asking about suprapubic cath; RN educated wife and patient.

## 2019-12-20 ENCOUNTER — Inpatient Hospital Stay (HOSPITAL_COMMUNITY): Payer: Medicare Other

## 2019-12-20 ENCOUNTER — Inpatient Hospital Stay (HOSPITAL_COMMUNITY): Payer: Medicare Other | Admitting: Occupational Therapy

## 2019-12-20 ENCOUNTER — Inpatient Hospital Stay (HOSPITAL_COMMUNITY): Payer: Medicare Other | Admitting: Speech Pathology

## 2019-12-20 NOTE — Progress Notes (Signed)
Physical Therapy Session Note  Patient Details  Name: Stephen Wade MRN: [id number] Date of Birth: June 22, 1951  Today's Date: 12/20/2019 PT Individual Time: 1302-1358 PT Individual Time Calculation (min): 56 min   Short Term Goals: Week 2:  PT Short Term Goal 1 (Week 2): Pt will perform bed mobility with mod assist PT Short Term Goal 2 (Week 2): Pt will transfer to [facility] with mod assist PT Short Term Goal 3 (Week 2): Pt will perform sit<>stand with mod assist and BUE support on rail or in stedy PT Short Term Goal 4 (Week 2): Pt will propel WC >165ft with supevision assist  Skilled Therapeutic Interventions/Progress Updates:     Patient in bed with his wife at bedside upon PT arrival. Patient alert and agreeable to PT session. Patient denied pain, however, reported increased B LE "stiffness" and requested to stretch his LEs prior to sitting up.  Therapeutic Activity: Bed Mobility: Patient performed rolling R and L x2 with min A using the bed rails in a flat bed. Provided cues for bending and pushing with the opposite leg to roll. He performed supine to sit with mod-max A. Provided verbal cues for roll to L side-lying, set L elbow and push to elbow then hand to sit up. Patient had difficulty following cues due to poor motor planning and posterior trunk lean in side lying and required significant increased time to complete task.  Transfers: Patient performed a slide board transfer from the bed to the TIS w/c with mod A +2 following seated NRM, see below. Provided verbal cues and demonstration for head-hips relationship, board and hand placement, and the importance of leaning forward to prevent extensor tone from pushing his into a posterior lean and sliding forward off the board.  Neuromuscular Re-ed: Patient performed sitting balance on EOB x11 min. Patient with significant posterior lean in sitting requiring mod-max A for balance. Progressed to min A-CGA with elbows on thighs. Performed forward  leans in sitting until his hips began to lift off the bed with mod A x5 to promote forward lean in sitting and transfers. Educated patient and his wife on extensor tone and its effect on mobility and safety with transfers while patient sat EOB.  Therapeutic Exercise: Patient performed the following exercises with verbal and tactile cues for proper technique. -B DF stretch 2x30 sec, very limited on R with knee in flexion and extension -B AAROM knee and hip flexion/extension x5 -B hip IR/ER with knee extension x10 Noted increased R PF and ER in resting position.  Patient requested to get back to bed to have a BM on the bed pan once up in the TIS w/c. Returned patient to bed dependently with use of the Maxi-move. Required mod A for sling placement and removal. Patient was in the bed on the bed pan with his wife in the room at end of session with breaks locked, bed alarm set, and all needs within reach. Educated patient and his wife about the importance of increased sitting tolerance for the patient. Patient and RN agreeable to have the patient sit up in the TIS w/c, transferring with the Maxi-move, at dinner time this evening to trial sitting up to 1 hour. Patient and his wife in agreement.    Therapy Documentation Precautions:  Precautions Precautions: Fall Precaution Comments: hx MS (R LE deficits > L LE, spastic diplegia) Restrictions Weight Bearing Restrictions: No    Therapy/Group: Individual Therapy  Dicky Boer L Emilygrace Grothe PT, DPT  12/20/2019, 5:40 PM

## 2019-12-20 NOTE — Progress Notes (Signed)
Speech Language Pathology Daily Session Note  Patient Details  Name: Stephen Wade MRN: [id number] Date of Birth: 24-Nov-1951  Today's Date: 12/20/2019 SLP Individual Time: 5732-2025 SLP Individual Time Calculation (min): 29 min  Short Term Goals: Week 2: SLP Short Term Goal 1 (Week 2): Pt will consume Dys 1/nectar diet with Min overt s/sx aspiration and efficient mastication and oral clearance with Supervision A verbal cues for use of swallow strtaegies. SLP Short Term Goal 2 (Week 2): Pt will consume trials of thin H2O via teaspoon with Min A verbal cues for use of swallow strategies and Min overt s/sx aspiration. SLP Short Term Goal 3 (Week 2): Pt will demonstrate ability to manage secretions via throat clear/cough and use of suction with Min A verbal cues. SLP Short Term Goal 4 (Week 2): Pt will demonstrate recall of new/day to day information with Min A verbal/visual cues for use of compensatory strategies and/or external aids. SLP Short Term Goal 5 (Week 2): Pt will demonstrate ability to problem solve during functional mildly complex tasks with Mod A verbal/visual cues. SLP Short Term Goal 6 (Week 2): Pt will use speech intelligibility strategies with Min A verbal cues to achieve 80% intelligibility at the sentence level.  Skilled Therapeutic Interventions: Pt was seen for skilled ST targeting dysphagia goals. SLP facilitated session with upgraded trials of Dys 2 solids. Complete oral clearance and efficient mastication observed throughout intake, and no overt s/sx aspiration evidenced.  Recommend additional trial of Dys 2 prior to, continue current diet for now. SLP also provided Min A verbal cues in order for pt to recall rationale behind current diet and specifics of water protocol. Suspect pt will be ready for repeat MBSS this week if he can continue to maintain alertness and management of secretions. Pt left laying in bed with alarm set and all needs within reach. Continue per current  plan of care.         Pain Pain Assessment Pain Scale: 0-10 Pain Score: 3  Pain Type: Chronic pain Pain Location: Back Pain Descriptors / Indicators: Aching Pain Intervention(s): Repositioned  Therapy/Group: Individual Therapy  Arbutus Leas 12/20/2019, 11:14 AM

## 2019-12-20 NOTE — Plan of Care (Addendum)
  Problem: Consults Goal: RH GENERAL PATIENT EDUCATION Description: See Patient Education module for education specifics. Outcome: Progressing   Problem: RH BOWEL ELIMINATION Goal: RH STG MANAGE BOWEL WITH ASSISTANCE Description: STG Manage Bowel with Min Assistance. Outcome: Not Progressing, LBM 12/23 Goal: RH STG MANAGE BOWEL W/MEDICATION W/ASSISTANCE Description: STG Manage Bowel with Medication with [place]. Outcome: Progressing   Problem: RH BLADDER ELIMINATION Goal: RH STG MANAGE BLADDER WITH ASSISTANCE Description: STG Manage Bladder With max Assistance Outcome: Not Progressing   Problem: RH SKIN INTEGRITY Goal: RH STG SKIN FREE OF INFECTION/BREAKDOWN Description: Min assist Outcome: Progressing Goal: RH STG MAINTAIN SKIN INTEGRITY WITH ASSISTANCE Description: STG Maintain Skin Integrity With [place]. Outcome: Progressing   Problem: RH SAFETY Goal: RH STG ADHERE TO SAFETY PRECAUTIONS W/ASSISTANCE/DEVICE Description: STG Adhere to Safety Precautions With Min Assistance/Device. Outcome: Progressing Goal: RH STG DECREASED RISK OF FALL WITH ASSISTANCE Description: STG Decreased Risk of Fall With [employer]. Outcome: Progressing

## 2019-12-20 NOTE — Progress Notes (Signed)
[facility] PHYSICAL MEDICINE & REHABILITATION PROGRESS NOTE  Subjective/Complaints: Patient seen laying in bed this AM.  He states he slept well overnight and had a good weekend.   ROS: Denies CP, SOB, N/V/D  Objective: Vital Signs: Blood pressure 116/68, pulse 76, temperature 98 F (36.7 C), resp. rate 19, height 6' (1.829 m), weight 88.5 kg, SpO2 97 %. No results found. No results for input(s): WBC, HGB, HCT, PLT in the last 72 hours. Recent Labs    12/17/19 1455  NA 142  K 3.8  CL 110  CO2 25  GLUCOSE 121*  BUN 19  CREATININE 0.77  CALCIUM 8.9    Physical Exam: BP 116/68 (BP Location: Left Arm)   Pulse 76   Temp 98 F (36.7 C)   Resp 19   Ht 6' (1.829 m)   Wt 88.5 kg   SpO2 97%   BMI 26.46 kg/m   Constitutional: No distress . Vital signs reviewed. HENT: Normocephalic.  Atraumatic. Eyes: EOMI. No discharge. Cardiovascular: No JVD. Respiratory: Normal effort.  No stridor. GI: Non-distended. Skin: Warm and dry.  Intact. Psych: Slowed.  Flat. Musc: Lower extremity edema. GU: + Foley. Neuro: Alert Dysarthria, stable Motor: Bilateral upper extremities: 4+-5/5 proximal distal Right lower extremity: 1/5 proximal distal, stable Left lower extremity: Hip flexion, knee extension 1+/5, ankle dorsiflexion 1+/5, stable Extensor tone b/l LE R>L  Assessment/Plan: 1. Functional deficits secondary to debility which require 3+ hours per day of interdisciplinary therapy in a comprehensive inpatient rehab setting.  Physiatrist is providing close team supervision and 24 hour management of active medical problems listed below.  Physiatrist and rehab team continue to assess barriers to discharge/monitor patient progress toward functional and medical goals  Care Tool:  Bathing    Body parts bathed by patient: Chest   Body parts bathed by helper: Right arm, Left arm, Abdomen, Front perineal area, Buttocks, Right upper leg, Left upper leg, Right lower leg, Left lower leg,  Face     Bathing assist Assist Level: 2 Helpers     Upper Body Dressing/Undressing Upper body dressing   What is the patient wearing?: Pull over shirt    Upper body assist Assist Level: Maximal Assistance - Patient 25 - 49%    Lower Body Dressing/Undressing Lower body dressing      What is the patient wearing?: Pants, Incontinence brief     Lower body assist Assist for lower body dressing: Total Assistance - Patient < 25%     Toileting Toileting    Toileting assist Assist for toileting: Dependent - Patient 0%     Transfers Chair/bed transfer  Transfers assist  Chair/bed transfer activity did not occur: Safety/medical concerns  Chair/bed transfer assist level: 2 Helpers     Locomotion Ambulation   Ambulation assist   Ambulation activity did not occur: Safety/medical concerns          Walk 10 feet activity   Assist  Walk 10 feet activity did not occur: Safety/medical concerns        Walk 50 feet activity   Assist Walk 50 feet with 2 turns activity did not occur: Safety/medical concerns         Walk 150 feet activity   Assist Walk 150 feet activity did not occur: Safety/medical concerns         Walk 10 feet on uneven surface  activity   Assist Walk 10 feet on uneven surfaces activity did not occur: Safety/medical concerns  Wheelchair     Assist Will patient use wheelchair at discharge?: Yes Type of Wheelchair: Manual    Wheelchair assist level: Supervision/Verbal cueing Max wheelchair distance: 54ft    Wheelchair 50 feet with 2 turns activity    Assist        Assist Level: Supervision/Verbal cueing   Wheelchair 150 feet activity     Assist Wheelchair 150 feet activity did not occur: Safety/medical concerns          Medical Problem List and Plan: 1.  Decreased functional mobility secondary to acute respiratory distress with hypoxic respiratory failure secondary to multi focal pneumonia in  the setting of MS.    Follow-up chest x-ray 12/07/2019 bibasilar pulmonary infiltrates improved from prior exams, repeat chest x-ray on 12/21 personally reviewed showing?  Persistence of infiltrate- monitor at present per Pulm.   Patient completed 10-day course of Augmentin on 12/17  Completed 5-day course of IV Solu-Medrol.    Continue CIR   Palliative care consulted 2.  Antithrombotics: DVT/anticoagulation: Subcutaneous Lovenox.  CT angiogram of chest negative for pulmonary emboli             Antiplatelet therapy: N/A 3. Pain Management/spasticity: Baclofen 5 mg 3 times daily 4. Mood: Provide emotional support  Appreciate neuropsych eval  Fluoxetine started on 12/18, increased on 12/26             -antipsychotic agents: N/A 5. Neuropsych: This patient is not capable of making decisions on his own behalf. 6. Skin/Wound Care: Routine skin checks/pressure injury to buttocks.  Follow-up skin care checks 7. Fluids/Electrolytes/Nutrition: Routine in and outs 8.  Atrial fibrillation with RVR.  IV Cardizem discontinued due to hypotension.  No current plan for anticoagulation  Monitor with increased mobility. 9.  Neurogenic bowel bladder with recurrent UTIs.  Indwelling Foley catheter tube to remain in place at the recommendations of urology services per plan outpatient evaluation for possible need for suprapubic tube  UA on 12/21 suggestive of infection, urine culture showing Pseudomonas, pansensitive  Ciprofloxacin started on 12/23, Foley replaced 10.  Dysphagia.  Dysphagia #1 nectar liquids.  Follow-up speech therapy.    Advance diet as tolerated. 11.AKI:   Creatinine within normal limits on 12/28  Will trial d/c IVF 12. Spasticity in setting of MS  Baclofen increased to home dose of 10 mg 3 times daily on 12/17, decreased back to 5 mg 3 times daily on 12/19 due to lethargy, will consider further increase back to home 10 mg as lethargy may be related to UTI  ITB may be an option if patient  amenable. 13. Steroid induced hyperglycemia: ?Resolved  Cont to monitor 14.  Hypernatremia  Sodium 142 on 12/28  Cont to monitor 15. Hypoalbuminemia  Supplement initiated on 12/16 16.  Transaminitis  LFTs elevated, but improving on 12/21  Continue to monitor 17. ABLA: Resolved  Hb 13.9 on 12/21  Cont to monitor 18. Constipation  Bowel meds increased on 12/27    LOS: 13 days A FACE TO FACE EVALUATION WAS PERFORMED  Ankit Lorie Phenix 12/20/2019, 9:33 AM

## 2019-12-20 NOTE — Progress Notes (Signed)
Occupational Therapy Session Note  Patient Details  Name: Stephen Wade MRN: [id number] Date of Birth: Apr 06, 1951  Today's Date: 12/20/2019 OT Individual Time: 8101-7510 OT Individual Time Calculation (min): 47 min    Short Term Goals: Week 2:  OT Short Term Goal 1 (Week 2): Pt will don shirt with mod A OT Short Term Goal 2 (Week 2): Pt will maintain EOB sitting balance with mod A OT Short Term Goal 3 (Week 2): Pt will complete LB dressing with max A OT Short Term Goal 4 (Week 2): Pt will complete transfer bed > roll in shower chair with max assist of one caregiver  Skilled Therapeutic Interventions/Progress Updates:    Patient in bed, alert, states that he feels like he has no energy.  Declined eating breakfast at this time but agreeable to adl tasks.  Completed oral care with set up bed level.  Able to comb hair with set up.  Washed his face with set up.  OH shirt requires max A.  LB dressing is max/dependent.  Rolling in bed with max A to start but after completing mobility activities rolling improves to min/mod A using bed rails.  Supine to SSP at edge of bed with max A of 2, completed SSP trunk control activities for 5 minutes with max A to maintain balance.  Returned to supine with max A of 2.  Use of bed pan max A for rolling and clothing management.  He was unable to void - max A to replace brief and pull pants over hips.  He remained in bed at close of session, bed alarm set and call bell in reach.    Therapy Documentation Precautions:  Precautions Precautions: Fall Precaution Comments: hx MS (R LE deficits > L LE, spastic diplegia) Restrictions Weight Bearing Restrictions: No General:   Vital Signs:   Pain: Pain Assessment Pain Scale: 0-10 Pain Score: 3  Pain Type: Chronic pain Pain Location: Back Pain Descriptors / Indicators: Aching Pain Intervention(s): Repositioned   Other Treatments:     Therapy/Group: Individual Therapy  Carlos Levering 12/20/2019, 8:53  AM

## 2019-12-21 ENCOUNTER — Inpatient Hospital Stay (HOSPITAL_COMMUNITY): Payer: Medicare Other

## 2019-12-21 ENCOUNTER — Inpatient Hospital Stay (HOSPITAL_COMMUNITY): Payer: Medicare Other | Admitting: Occupational Therapy

## 2019-12-21 DIAGNOSIS — K5901 Slow transit constipation: Secondary | ICD-10-CM

## 2019-12-21 LAB — CREATININE, SERUM
Creatinine, Ser: 0.8 mg/dL (ref 0.61–1.24)
GFR calc Af Amer: >60 mL/min (ref >60)
GFR calc non Af Amer: >60 mL/min (ref >60)

## 2019-12-21 NOTE — Progress Notes (Signed)
Therapy is at the bedside. Will attempt to follow-up with patient at later time and/or date.   [name], AGPCNP-BC Palliative Medicine Team   NO CHARGE

## 2019-12-21 NOTE — Plan of Care (Signed)
  Problem: Consults Goal: RH GENERAL PATIENT EDUCATION Description: See Patient Education module for education specifics. 12/21/2019 0848 by Romana Juniper, LPN Outcome: Progressing 12/21/2019 0848 by Romana Juniper, LPN Outcome: Progressing   Problem: RH BOWEL ELIMINATION Goal: RH STG MANAGE BOWEL WITH ASSISTANCE Description: STG Manage Bowel with Min Assistance. 12/21/2019 0848 by Romana Juniper, LPN Outcome: Progressing 12/21/2019 0848 by Romana Juniper, LPN Outcome: Progressing Goal: RH STG MANAGE BOWEL W/MEDICATION W/ASSISTANCE Description: STG Manage Bowel with Medication with [place]. 12/21/2019 0848 by Romana Juniper, LPN Outcome: Progressing 12/21/2019 0848 by Romana Juniper, LPN Outcome: Progressing   Problem: RH SKIN INTEGRITY Goal: RH STG SKIN FREE OF INFECTION/BREAKDOWN Description: Min assist 12/21/2019 0848 by Romana Juniper, LPN Outcome: Progressing 12/21/2019 0848 by Romana Juniper, LPN Outcome: Progressing  Problem: RH SAFETY Goal: RH STG ADHERE TO SAFETY PRECAUTIONS W/ASSISTANCE/DEVICE Description: STG Adhere to Safety Precautions With Min Assistance/Device. 12/21/2019 0848 by Romana Juniper, LPN Outcome: Progressing 12/21/2019 0848 by Romana Juniper, LPN Outcome: Progressing Goal: RH STG DECREASED RISK OF FALL WITH ASSISTANCE Description: STG Decreased Risk of Fall With [employer]. 12/21/2019 0848 by Romana Juniper, LPN Outcome: Progressing 12/21/2019 0848 by Romana Juniper, LPN Outcome: Progressing

## 2019-12-21 NOTE — Progress Notes (Signed)
Physical Therapy Session Note  Patient Details  Name: Stephen Wade MRN: [id number] Date of Birth: March 31, 1951  Today's Date: 12/21/2019 PT Individual Time: 1001-1030 PT Individual Time Calculation (min): 29 min   Short Term Goals: Week 2:  PT Short Term Goal 1 (Week 2): Pt will perform bed mobility with mod assist PT Short Term Goal 2 (Week 2): Pt will transfer to [facility] Fall with mod assist PT Short Term Goal 3 (Week 2): Pt will perform sit<>stand with mod assist and BUE support on rail or in stedy PT Short Term Goal 4 (Week 2): Pt will propel WC >193ft with supevision assist  Skilled Therapeutic Interventions/Progress Updates:    Pt request to eat a few bites of breakfast and requires min assist overall. Declines sitting EOB or up in w/c to better promote posture during eating. Total assist to don socks to prepare for out of bed. Max assist for supine to sit using bed rail for support and min to mod assist for sitting balance once EOB, max assist to scoot to EOB. Requires +2 for safety for positioning of slideboard and then max +2 for transfer via slideboard into w/c with focus on hand placement, anterior weightshift, and head hips relationship. Scooting technique in chair with max assist for assist for anterior weightshift. Set up with all needs in reach and seatbelt alarm donned.   Therapy Documentation Precautions:  Precautions Precautions: Fall Precaution Comments: hx MS (R LE deficits > L LE, spastic diplegia) Restrictions Weight Bearing Restrictions: No   Pain: Pain Assessment Pain Scale: 0-10 Pain Score: 0-No pain    Therapy/Group: Individual Therapy  Canary Brim Ivory Broad, PT, DPT, CBIS  12/21/2019, 10:47 AM

## 2019-12-21 NOTE — Progress Notes (Signed)
Occupational Therapy Session Note  Patient Details  Name: Stephen Wade MRN: [id number] Date of Birth: February 25, 1951  Today's Date: 12/21/2019 OT Individual Time: 9417-4081 OT Individual Time Calculation (min): 55 min    Short Term Goals: Week 2:  OT Short Term Goal 1 (Week 2): Pt will don shirt with mod A OT Short Term Goal 2 (Week 2): Pt will maintain EOB sitting balance with mod A OT Short Term Goal 3 (Week 2): Pt will complete LB dressing with max A OT Short Term Goal 4 (Week 2): Pt will complete transfer bed > roll in shower chair with max assist of one caregiver  Skilled Therapeutic Interventions/Progress Updates:    Treatment session with focus on functional transfers and dynamic sitting balance during self-care tasks of bathing and dressing.  Pt received semi-reclined in bed reporting desire to shower.  Pt more alert and participatory throughout session.  Completed bed mobility with max assist of one caregiver.  Demonstrating posterior lean and lean to Rt in unsupported sitting at EOB.  Mod assist to maintain sitting.  Engaged in dynamic sitting balance incorporating reaching with focus on obtaining and maintaining midline posture.  Completed slide board transfer to roll in shower chair with max assist +2 with multimodal cues to facilitate forward weight shift to allow for increased safety with transfer.  Pt able to complete mini sit > stand from edge of chair with therapist providing over the back assist to maintain forward posture to allow for pants to be pulled down and hips scooted back in chair.  Pt completed bathing seated in roll in shower chair, only requiring assist with washing buttocks and feet.  Due to time constraints, returned to bed via [place].  Pt able to participate in pulling self up in [place] and maintained upright trunk control while in [place] to transfer back to bed.  Max assist UB dressing with mod assist to maintain sitting balance at EOB.  Completed LB dressing at bed level  with pt rolling Rt and Lt with min assist with use of bed rails and +2 to manage positioning and clothing management.    Pt continues to demonstrate extensor tone in BLE, but much improved mobility and transfers this session.  Therapy Documentation Precautions:  Precautions Precautions: Fall Precaution Comments: hx MS (R LE deficits > L LE, spastic diplegia) Restrictions Weight Bearing Restrictions: No General:   Vital Signs: Therapy Vitals Temp: 97.8 F (36.6 C) Pulse Rate: 83 Resp: 17 BP: 119/75 Oxygen Therapy SpO2: 94 % Pain: Pain Assessment Pain Scale: 0-10 Pain Score: 0-No pain   Therapy/Group: Individual Therapy  Simonne Come 12/21/2019, 9:47 AM

## 2019-12-21 NOTE — Progress Notes (Signed)
Speech Language Pathology Daily Session Note  Patient Details  Name: Stephen Wade MRN: [id number] Date of Birth: June 23, 1951  Today's Date: 12/21/2019 SLP Individual Time: 1416-1500 SLP Individual Time Calculation (min): 44 min  Short Term Goals: Week 2: SLP Short Term Goal 1 (Week 2): Pt will consume Dys 1/nectar diet with Min overt s/sx aspiration and efficient mastication and oral clearance with Supervision A verbal cues for use of swallow strtaegies. SLP Short Term Goal 2 (Week 2): Pt will consume trials of thin H2O via teaspoon with Min A verbal cues for use of swallow strategies and Min overt s/sx aspiration. SLP Short Term Goal 3 (Week 2): Pt will demonstrate ability to manage secretions via throat clear/cough and use of suction with Min A verbal cues. SLP Short Term Goal 4 (Week 2): Pt will demonstrate recall of new/day to day information with Min A verbal/visual cues for use of compensatory strategies and/or external aids. SLP Short Term Goal 5 (Week 2): Pt will demonstrate ability to problem solve during functional mildly complex tasks with Mod A verbal/visual cues. SLP Short Term Goal 6 (Week 2): Pt will use speech intelligibility strategies with Min A verbal cues to achieve 80% intelligibility at the sentence level.  Skilled Therapeutic Interventions: Skilled ST services focused on swallow and cognitive skills. SLP facilitated PO consumption of dys 2 snack trial, pt demonstrated efficient mastication and oral clearance with no overt s/s aspiration. SLP upgraded to dys 2 textures and education pt and wife on planned MBS for liquid upgrade. All question answered to satisfaction. SLP upgraded EMST device to 16 cm H2O following trials with 15 cm and 17 cm resistance. Pt demonstrated mod I semi-complex problem solving and working [employer] (given visual aid of written problem) in 5 out 5 trials. Pt completed 25 repetition x2 reporting self-effort level of 7/10. Pt was left in room with  wife, call bell with reach and bed alarm set. ST recommends to continue skilled ST services.      Pain Pain Assessment Pain Score: 0-No pain  Therapy/Group: Individual Therapy  Hever Castilleja  [place] 12/21/2019, 3:55 PM

## 2019-12-21 NOTE — Progress Notes (Signed)
Pt had 0500 lab draw for creatine level. Pt refused lab draw due to being tired. Lab will come by after breakfast to try again.

## 2019-12-21 NOTE — Progress Notes (Signed)
[place] PHYSICAL MEDICINE & REHABILITATION PROGRESS NOTE  Subjective/Complaints: Patient seen laying in bed this morning.  He states he slept well overnight.  He states his lethargy is about baseline.  ROS: Denies CP, SOB, N/V/D  Objective: Vital Signs: Blood pressure 119/75, pulse 83, temperature 97.8 F (36.6 C), resp. rate 17, height 6' (1.829 m), weight 88.5 kg, SpO2 94 %. No results found. No results for input(s): WBC, HGB, HCT, PLT in the last 72 hours. No results for input(s): NA, K, CL, CO2, GLUCOSE, BUN, CREATININE, CALCIUM in the last 72 hours.  Physical Exam: BP 119/75   Pulse 83   Temp 97.8 F (36.6 C)   Resp 17   Ht 6' (1.829 m)   Wt 88.5 kg   SpO2 94%   BMI 26.46 kg/m   Constitutional: No distress . Vital signs reviewed. HENT: Normocephalic.  Atraumatic. Eyes: EOMI. No discharge. Cardiovascular: No JVD. Respiratory: Normal effort.  No stridor. GI: Non-distended. Skin: Warm and dry.  Intact. Psych: Flat. Musc: Lower extremity edema stable. GU: + Foley. Neuro: Alert Dysarthria, unchanged Motor: Bilateral upper extremities: 4+-5/5 proximal distal Right lower extremity: 1/5 proximal distal, stable Left lower extremity: Hip flexion, knee extension 1+/5, ankle dorsiflexion 1+/5, stable Extensor tone b/l LE R>L, unchanged  Assessment/Plan: 1. Functional deficits secondary to debility which require 3+ hours per day of interdisciplinary therapy in a comprehensive inpatient rehab setting.  Physiatrist is providing close team supervision and 24 hour management of active medical problems listed below.  Physiatrist and rehab team continue to assess barriers to discharge/monitor patient progress toward functional and medical goals  Care Tool:  Bathing    Body parts bathed by patient: Chest   Body parts bathed by helper: Right arm, Left arm, Abdomen, Front perineal area, Buttocks, Right upper leg, Left upper leg, Right lower leg, Left lower leg, Face      Bathing assist Assist Level: 2 Helpers     Upper Body Dressing/Undressing Upper body dressing   What is the patient wearing?: Pull over shirt    Upper body assist Assist Level: Maximal Assistance - Patient 25 - 49%    Lower Body Dressing/Undressing Lower body dressing      What is the patient wearing?: Pants, Incontinence brief     Lower body assist Assist for lower body dressing: Total Assistance - Patient < 25%     Toileting Toileting    Toileting assist Assist for toileting: Dependent - Patient 0%     Transfers Chair/bed transfer  Transfers assist  Chair/bed transfer activity did not occur: Safety/medical concerns  Chair/bed transfer assist level: Dependent - Patient 0%(maxi move)     Locomotion Ambulation   Ambulation assist   Ambulation activity did not occur: Safety/medical concerns          Walk 10 feet activity   Assist  Walk 10 feet activity did not occur: Safety/medical concerns        Walk 50 feet activity   Assist Walk 50 feet with 2 turns activity did not occur: Safety/medical concerns         Walk 150 feet activity   Assist Walk 150 feet activity did not occur: Safety/medical concerns         Walk 10 feet on uneven surface  activity   Assist Walk 10 feet on uneven surfaces activity did not occur: Safety/medical concerns         Wheelchair     Assist Will patient use wheelchair at discharge?: Yes Type  of Wheelchair: Manual    Wheelchair assist level: Supervision/Verbal cueing Max wheelchair distance: 67ft    Wheelchair 50 feet with 2 turns activity    Assist        Assist Level: Supervision/Verbal cueing   Wheelchair 150 feet activity     Assist Wheelchair 150 feet activity did not occur: Safety/medical concerns          Medical Problem List and Plan: 1.  Decreased functional mobility secondary to acute respiratory distress with hypoxic respiratory failure secondary to multi focal  pneumonia in the setting of MS.    Follow-up chest x-ray 12/07/2019 bibasilar pulmonary infiltrates improved from prior exams, repeat chest x-ray on 12/21 personally reviewed showing?  Persistence of infiltrate- monitor at present per Pulm.   Patient completed 10-day course of Augmentin on 12/17  Completed 5-day course of IV Solu-Medrol.    Continue CIR   Palliative care consulted, await recs 2.  Antithrombotics: DVT/anticoagulation: Subcutaneous Lovenox.  CT angiogram of chest negative for pulmonary emboli  CBC within acceptable range on 12/21, labs ordered for tomorrow             Antiplatelet therapy: N/A 3. Pain Management/spasticity: Baclofen 5 mg 3 times daily 4. Mood: Provide emotional support  Appreciate neuropsych eval  Fluoxetine started on 12/18, increased on 12/26             -antipsychotic agents: N/A 5. Neuropsych: This patient is not capable of making decisions on his own behalf. 6. Skin/Wound Care: Routine skin checks/pressure injury to buttocks.  Follow-up skin care checks 7. Fluids/Electrolytes/Nutrition: Routine in and outs 8.  Atrial fibrillation with RVR.  IV Cardizem discontinued due to hypotension.  No current plan for anticoagulation  Monitor with increased mobility. 9.  Neurogenic bowel bladder with recurrent UTIs.  Indwelling Foley catheter tube to remain in place at the recommendations of urology services per plan outpatient evaluation for possible need for suprapubic tube  UA on 12/21 suggestive of infection, urine culture showing Pseudomonas, pansensitive  Ciprofloxacin started on 12/23, Foley replaced 10.  Dysphagia.  Dysphagia #1 nectar liquids.  Follow-up speech therapy.    Advance diet as tolerated. 11.AKI:   Creatinine within normal limits on 12/28  Will trial d/c IVF, plan to order labs later this week 12. Spasticity in setting of MS  Baclofen increased to home dose of 10 mg 3 times daily on 12/17, decreased back to 5 mg 3 times daily on 12/19 due to  lethargy, will consider further increase back to home 10 mg as lethargy may be related to UTI  ITB may be an option if patient amenable. 13. Steroid induced hyperglycemia: ?Resolved  Cont to monitor 14.  Hypernatremia  Sodium 142 on 12/28  Cont to monitor 15. Hypoalbuminemia  Supplement initiated on 12/16 16.  Transaminitis  LFTs elevated, but improving on 12/21  Continue to monitor 17. ABLA: Resolved  Hb 13.9 on 12/21  Cont to monitor 18. Constipation  Bowel meds increased on 12/27  Improving  LOS: 14 days A FACE TO FACE EVALUATION WAS PERFORMED  Mycala Warshawsky Lorie Phenix 12/21/2019, 7:24 AM

## 2019-12-22 ENCOUNTER — Inpatient Hospital Stay (HOSPITAL_COMMUNITY): Payer: Medicare Other | Admitting: Speech Pathology

## 2019-12-22 ENCOUNTER — Inpatient Hospital Stay (HOSPITAL_COMMUNITY): Payer: Medicare Other

## 2019-12-22 ENCOUNTER — Inpatient Hospital Stay (HOSPITAL_COMMUNITY): Payer: Medicare Other | Admitting: Occupational Therapy

## 2019-12-22 ENCOUNTER — Encounter (HOSPITAL_COMMUNITY): Payer: Medicare Other | Admitting: Psychology

## 2019-12-22 DIAGNOSIS — G9341 Metabolic encephalopathy: Secondary | ICD-10-CM

## 2019-12-22 LAB — CBC
HCT: 41.4 % (ref 39.0–52.0)
Hemoglobin: 13.7 g/dL (ref 13.0–17.0)
MCH: 30.6 pg (ref 26.0–34.0)
MCHC: 33.1 g/dL (ref 30.0–36.0)
MCV: 92.4 fL (ref 80.0–100.0)
Platelets: 367 10*3/uL (ref 150–400)
RBC: 4.48 MIL/uL (ref 4.22–5.81)
RDW: 14.6 % (ref 11.5–15.5)
WBC: 6.3 10*3/uL (ref 4.0–10.5)
nRBC: 0 % (ref 0.0–0.2)

## 2019-12-22 MED ORDER — BACLOFEN 10 MG PO TABS
10.0000 mg | ORAL_TABLET | Freq: Three times a day (TID) | ORAL | Status: DC
  Administered 2019-12-22 – 2020-01-02 (×16): 10 mg via ORAL
  Filled 2019-12-22 (×23): qty 1

## 2019-12-22 NOTE — Progress Notes (Signed)
PALLIATIVE NOTE:  Patient sleepy but easily aroused. Wife Stephen Wade is at the bedside.  Palliative has been involved in Stephen Wade's care however it has been some time.   I introduced myself and re-introduced Palliative's role in patient's care. Both patient and wife verbalized understanding. Patient reports he has had a long day and may not be able to be as engage as he would like.   He shares that his care has been great with no complaints and wife agrees. He reports he is somewhat improving however he does not like the fact that his medications have to be crushed. He shares the awful taste leading him into not be excited about taking them or food. His wife also shares that he wants to drink water and have other items although they are aware of his dysphagia restrictions. He is scheduled for SLP/MBS evaluation tomorrow and both patient and wife is hopeful that his diet may be further advanced and he can tolerate his pills in more of a whole form. Therapeutic listening and support provided.   Wife and patient confirms patient is not in the best of mood today and is somewhat having feelings of depression and hopelessness. He does not wish to further discuss and is in agreement with further discussion regarding goals of care and wishes tomorrow. Wife agrees and is appreciative of the support. She shares tomorrow around lunch time would be a good time to come back and maybe patient will be more open to discussions at that time.   Support given and will plan to follow back up tomorrow to address further goals of care with patient and wife.   Total Time: 35 min.   Greater than 50%  of this time was spent counseling and coordinating care related to the above assessment and plan.  [name], AGPCNP-BC Palliative Medicine Team  Phone: [phone]

## 2019-12-22 NOTE — Progress Notes (Signed)
Occupational Therapy Weekly Progress Note  Patient Details  Name: Stephen Wade MRN: [id number] Date of Birth: 05/19/1951  Beginning of progress report period: December 14, 2019 End of progress report period: December 22, 2019  Today's Date: 12/22/2019 OT Individual Time: 0831-0933 OT Individual Time Calculation (min): 62 min    Patient has met 2 of 4 short term goals.  Pt is making slow progress towards goals.  Progress has been limited due to decreased participation in therapy sessions secondary to fatigue and increased confusion due to UTI.  Pt with improved participation beginning 12/29.  Pt is able to complete transfers max assist +2 with slide board, demonstrating improved trunk control and ability to follow one step commands to increase safety during mobility and transfers. Pt continues to be limited by fatigue.  Patient continues to demonstrate the following deficits: muscle weakness,decreased cardiorespiratoy endurance,decreased visual motor skills,decreased safety awareness and decreased memoryand decreased sitting balance, decreased standing balance, decreased postural control and decreased balance strategies and therefore will continue to benefit from skilled OT intervention to enhance overall performance with BADL and Reduce care partner burden.  Patient progressing toward long term goals..  Continue plan of care.  OT Short Term Goals Week 2:  OT Short Term Goal 1 (Week 2): Pt will don shirt with mod A OT Short Term Goal 1 - Progress (Week 2): Met OT Short Term Goal 2 (Week 2): Pt will maintain EOB sitting balance with mod A OT Short Term Goal 2 - Progress (Week 2): Met OT Short Term Goal 3 (Week 2): Pt will complete LB dressing with max A OT Short Term Goal 3 - Progress (Week 2): Progressing toward goal OT Short Term Goal 4 (Week 2): Pt will complete transfer bed > roll in shower chair with max assist of one caregiver OT Short Term Goal 4 - Progress (Week 2): Progressing  toward goal Week 3:  OT Short Term Goal 1 (Week 3): Pt will complete LB dressing with max A OT Short Term Goal 2 (Week 3): Pt will complete transfer bed > roll in shower chair/wheelchair with max assist of one caregiver OT Short Term Goal 3 (Week 3): Pt will complete UB dressing with min A OT Short Term Goal 4 (Week 3): Pt will maintain dynamic sitting balance with Mod assist during self-care tasks  Skilled Therapeutic Interventions/Progress Updates:    Treatment session with focus on functional mobility and participation in self-care tasks.  Pt irritated upon arrival as RN and 2 therapists arriving in room about same time and phone ringing.  Pt stating he had just woken up and it was too much.  Therapist utilized calming voice and asked pt if another time would be preferred.  Pt agreeable to therapy at this time, but asked RN to return later.  Pt asked to have ice chips.  Engaged in oral care with setup while upright in bed.  Pt took ice chips by spoon with no issues.  Completed bed mobility with Max assist and +2 for safety.  Engaged in static sitting at EOB with focus on obtaining and maintaining position.  Pt able to maintain in semi-flexed position with elbows on knees with min assist for ~15 seconds, overall improved sitting balance with mod assist and decreased posterior and Rt lean this session.  Completed slide board transfer bed > w/c with Max assist +2 with focus on maintaining anterior weight shift. Engaged in oral care and brushing hair in sitting at sink to increase OOB tolerance.    Pt reports feeling that he had had BM, therefore transferred back to bed as above, Max assist +2 with slide board.  Engaged in [place] with min assist with use of bed rails and 2nd person to assist pt in maintaining sidelying position to allow therapist to complete hygiene.  Donned clean brief and pants and positioned pt semi-reclined in bed with all needs in reach.  Therapy Documentation Precautions:   Precautions Precautions: Fall Precaution Comments: hx MS (R LE deficits > L LE, spastic diplegia) Restrictions Weight Bearing Restrictions: No Pain:  Pt with no c/o pain   Therapy/Group: Individual Therapy  Simonne Come 12/22/2019, 9:37 AM

## 2019-12-22 NOTE — Progress Notes (Signed)
Orthopedic Tech Progress Note Patient Details:  NIGUEL MOURE November 09, 1951 [id number] Called in order to [facility] for BILATERAL PRAFOs Patient ID: CASSIUS CULLINANE, male   DOB: 10-13-1951, 67 y.o.   MRN: [id number]   Janit Pagan 12/22/2019, 11:50 AM

## 2019-12-22 NOTE — Progress Notes (Signed)
Social Work Patient ID: Stephen Wade, male   DOB: June 11, 1951, 68 y.o.   MRN: [id number]  Met with pt and wife to discuss team conference progress this week and still targeting discharge of 1/13. Wife wants to be checked off to give him water, spoke with Erin-SP who will do this today with wife while in her session today. Wife aware and will stay for PT and SP sessions this afternoon. Pt's leg tone is bothering him and MD is suppose to increase his baclofen today. Work toward discharge 1/13.

## 2019-12-22 NOTE — Progress Notes (Signed)
Physical Therapy Weekly Progress Note  Patient Details  Name: Stephen Wade MRN: [id number] Date of Birth: Nov 20, 1951  Beginning of progress report period: December 08, 2019 End of progress report period: December 22, 2019  Today's Date: 12/22/2019 PT Individual Time: 1300-1400 PT Individual Time Calculation (min): 60 min   Patient has met 0 of 4 short term goals. Pt continues to demonstrate increase in R LE extensor tone, specifically in PF and ER. Pt overall depressed with current level of function, and fluctuates in motivation levels during therapy; however denies any pain with activity. Pt currently requires min/mod A to roll in bed using bedrails, +2 assist to transfer supine<>sitting EOB, and +2 to perform slideboard transfer into TIS WC. Wife very engaged and present during most therapy sessions.   Patient continues to demonstrate the following deficits muscle weakness, impaired timing and sequencing, abnormal tone, unbalanced muscle activation, decreased coordination and decreased motor planning and decreased sitting balance, decreased standing balance, decreased postural control and decreased balance strategies and therefore will continue to benefit from skilled PT intervention to increase functional independence with mobility.  Patient progressing toward long term goals..  Continue plan of care.  PT Short Term Goals Week 2:  PT Short Term Goal 1 (Week 2): Pt will perform bed mobility with mod assist PT Short Term Goal 1 - Progress (Week 2): Progressing toward goal PT Short Term Goal 2 (Week 2): Pt will transfer to [facility] with mod assist PT Short Term Goal 2 - Progress (Week 2): Progressing toward goal PT Short Term Goal 3 (Week 2): Pt will perform sit<>stand with mod assist and BUE support on rail or in stedy PT Short Term Goal 3 - Progress (Week 2): Progressing toward goal PT Short Term Goal 4 (Week 2): Pt will propel WC >164f with supevision assist PT Short Term Goal 4 - Progress  (Week 2): Progressing toward goal Week 3:  PT Short Term Goal 1 (Week 3): Pt will perform bed mobility with mod assist PT Short Term Goal 2 (Week 3): [facility] Short Term Goal 3 (Week 3): Pt will perform sit<>stand with mod assist and BUE support on rail or in stedy PT Short Term Goal 4 (Week 3): Pt will propel WC >1541fwith supevision assist  Skilled Therapeutic Interventions/Progress Updates:  Discharge planning;DME/adaptive equipment instruction;Functional mobility training;Pain management;Psychosocial support;Therapeutic Activities;UE/LE Strength taining/ROM;Balance/vestibular training;Community reintegration;Disease management/prevention;Functional electrical stimulation;Neuromuscular re-education;Patient/family education;Ambulation/gait training;Therapeutic Exercise;UE/LE Coordination activities;Wheelchair propulsion/positioning   Today's Interventions:  Received pt supine in bed, pt agreeable to therapy, and denied any pain throughout session. Wife present at bedside. Session focused on functional mobility/transfers, postural alignment and trunk control, LE strength, and improved endurance with activity. Pt initially not agreeable to transfer OOB, but after extensive convicting agreed to get into [facility] provided ice chips/water. Pt transferred supine<>sit +2 helpers and performed slideboard transfer +2 with verbal and tactile cues for head/hips relationship and anterior weight shifting. [facility] performed oral hygiene with swab and therapist provided 4 spoonfuls of water and provided verbal cues to tuck chin. Pt coughed. Pt requested ice chips. Therapst provided 5 ice chips with verbal cues to eat slowly. Pt reported urge to have BM and transferred TIS WC<>bed via slideboard +2 assist. Pt rolled to L and R with min A and bedrails for therapist to place bedpan. Pt unsuccessful. Therapist educated wife on transfer techniques and importance of  anterior weight shifting prior to intiating transfer for  future practice. PT performed bilateral PROM into hip flexion/extension and adduction. Pt with initial face grimacing but reported ease with continued stretch. RN present administering medication in pudding. Pt requested water afterwards and therapist provided 1 spoonful of water. Pt performed oral care afterwards. Concluded session with pt supine in bed, needs within reach, and bed alarm on.   Therapy Documentation Precautions:  Precautions Precautions: Fall Precaution Comments: hx MS (R LE deficits > L LE, spastic diplegia) Restrictions Weight Bearing Restrictions: No  Therapy/Group: Individual Therapy  Alfonse Alpers PT, DPT  12/22/2019, 7:47 AM

## 2019-12-22 NOTE — Progress Notes (Signed)
Speech Language Pathology Weekly Progress and Session Note  Patient Details  Name: Stephen Wade MRN: [id number] Date of Birth: 08-31-1951  Beginning of progress report period: December 15, 2019 End of progress report period: December 22, 2019  Today's Date: 12/22/2019 SLP Individual Time: 0201-0230 SLP Individual Time Calculation (min): 29 min  Short Term Goals: Week 2: SLP Short Term Goal 1 (Week 2): Pt will consume Dys 1/nectar diet with Min overt s/sx aspiration and efficient mastication and oral clearance with Supervision A verbal cues for use of swallow strtaegies. SLP Short Term Goal 1 - Progress (Week 2): Met SLP Short Term Goal 2 (Week 2): Pt will consume trials of thin H2O via teaspoon with Min A verbal cues for use of swallow strategies and Min overt s/sx aspiration. SLP Short Term Goal 2 - Progress (Week 2): Met SLP Short Term Goal 3 (Week 2): Pt will demonstrate ability to manage secretions via throat clear/cough and use of suction with Min A verbal cues. SLP Short Term Goal 3 - Progress (Week 2): Met SLP Short Term Goal 4 (Week 2): Pt will demonstrate recall of new/day to day information with Min A verbal/visual cues for use of compensatory strategies and/or external aids. SLP Short Term Goal 4 - Progress (Week 2): Progressing toward goal SLP Short Term Goal 5 (Week 2): Pt will demonstrate ability to problem solve during functional mildly complex tasks with Mod A verbal/visual cues. SLP Short Term Goal 5 - Progress (Week 2): Met SLP Short Term Goal 6 (Week 2): Pt will use speech intelligibility strategies with Min A verbal cues to achieve 80% intelligibility at the sentence level. SLP Short Term Goal 6 - Progress (Week 2): Met    New Short Term Goals: Week 3: SLP Short Term Goal 1 (Week 3): Pt will consume least restrictive diet with minimal overt s/sx aspiration, efficient mastication and oral clearance, and Supervision A for use of swallow strategies. SLP Short Term Goal  2 (Week 3): Pt will demonstrate efficient mastication and oral clearnace of Dys 3 (mech soft) solids X2 prior to upgrade. SLP Short Term Goal 3 (Week 3): Pt will participate in instrumental MBSS to reassess oropharyngeal swallow function and assess readiness for liquid advancement. SLP Short Term Goal 4 (Week 3): Pt will demonstrate recall of new/day to day information with Min A verbal/visual cues for use of compensatory strategies and/or external aids. SLP Short Term Goal 5 (Week 3): Pt will demonstrate ability to problem solve during functional mildly complex tasks with Min A verbal/visual cues. SLP Short Term Goal 6 (Week 3): Pt will use speech intelligibility strategies with Supervision A verbal cues to achieve 80% intelligibility at the sentence level.  Weekly Progress Updates: Pt has made functional gains and met 5 out of 6 short term goals this reporting period. Pt is currently overall Min assist due to dysarthria, dysphagia, and cognitive impairments impacting short term memory and problem solving. Pt is consuming recently upgraded Dys 2 diet with nectar thick liquids and is on the free water protocol. Pt has demonstrated significantly improved management of his secretions, bedside presentation during thin liquid trials (minimal overt s/sx aspiration), implementation of speech intelligibility strategies, and basic problem solving. Pt and family education is ongoing. Pt would continue to benefit from skilled ST while inpatient in order to maximize functional independence and reduce burden of care prior to discharge. Anticipate that pt will need 24/7 supervision at discharge in addition to [place] follow up at next level of care.  Intensity: Minumum of 1-2 x/day, 30 to 90 minutes Frequency: 3 to 5 out of 7 days Duration/Length of Stay: 01/04/19 Treatment/Interventions: Cognitive remediation/compensation;Cueing hierarchy;Dysphagia/aspiration precaution training;Functional tasks;Patient/family  education;Therapeutic Activities;Speech/Language facilitation;Internal/external aids   Daily Session Skilled Therapeutic Interventions: Pt was seen for skilled ST targeting dysphagia and speech goals. SLP provided training for pt's wife to perform oral care and ice chips/thin water in compliance with free water protocol. Pt's wife administered teaspoons and thin H2O as well as ice chips with 1 immediate throat clear, 1 immediate cough noted throughout his intake. Pt then consumed bites of Dys 2 chili lunch (brough by wife and determined to be of correct consistency in compliance with Dys 2). Pt exhibited 1 delayed throat clear throughout solid intake. Wife was signed off to provide water protocol and supervision of meals (signs in room updated). Recommend continue current diet, MBSS scheduled for tomorrow to assess potential for advancement. During sentence level tasks, pt utilized slow rate and increased vocal intensity with Min A verbal cues (during repetition for clarification of messages) to achieve ~80% intelligibility. Pt utilizing increased vocal intensity with greater independence than last ST session, although would benefit from further intervention to further increase intelligibility. Pt left laying in bed with alarm set and needs within reach, wife still present. Continue per current plan of care.         Pain Pain Assessment Pain Scale: 0-10 Pain Score: 0-No pain  Therapy/Group: Individual Therapy  Arbutus Leas 12/22/2019, 12:38 PM

## 2019-12-22 NOTE — Progress Notes (Signed)
Patient refused CHG bath indicated for foley catheter. Patient educated on importance of infection prevention. Patient still refused.

## 2019-12-22 NOTE — Progress Notes (Signed)
[facility] PHYSICAL MEDICINE & REHABILITATION PROGRESS NOTE  Subjective/Complaints: Patient seen laying in bed this AM.  He states he slept well overnight.  He is flat.   ROS: Denies CP, SOB, N/V/D  Objective: Vital Signs: Blood pressure 119/68, pulse 80, temperature 98.3 F (36.8 C), temperature source Oral, resp. rate 17, height 6' (1.829 m), weight 88.5 kg, SpO2 95 %. No results found. No results for input(s): WBC, HGB, HCT, PLT in the last 72 hours. Recent Labs    12/21/19 0816  CREATININE 0.80    Physical Exam: BP 119/68 (BP Location: Right Arm)   Pulse 80   Temp 98.3 F (36.8 C) (Oral)   Resp 17   Ht 6' (1.829 m)   Wt 88.5 kg   SpO2 95%   BMI 26.46 kg/m   Constitutional: No distress . Vital signs reviewed. HENT: Normocephalic.  Atraumatic. Eyes: EOMI. No discharge. Cardiovascular: No JVD. Respiratory: Normal effort.  No stridor. GI: Non-distended. Skin: Warm and dry.  Intact. Psych: Flat. Musc: Lower extremity edema, unchanged GU: + Foley Neuro: Alert Dysarthria, stable Motor:  Right lower extremity: 1/5 proximal distal, stable Left lower extremity: Hip flexion, knee extension 1+/5, ankle dorsiflexion 1+/5, stable Extensor tone b/l LE R>L, stable  Assessment/Plan: 1. Functional deficits secondary to debility which require 3+ hours per day of interdisciplinary therapy in a comprehensive inpatient rehab setting.  Physiatrist is providing close team supervision and 24 hour management of active medical problems listed below.  Physiatrist and rehab team continue to assess barriers to discharge/monitor patient progress toward functional and medical goals  Care Tool:  Bathing    Body parts bathed by patient: Right arm, Left arm, Chest, Abdomen, Front perineal area, Right upper leg, Left upper leg, Face   Body parts bathed by helper: Buttocks, Right lower leg, Left lower leg     Bathing assist Assist Level: Moderate Assistance - Patient 50 - 74%      Upper Body Dressing/Undressing Upper body dressing   What is the patient wearing?: Pull over shirt    Upper body assist Assist Level: Maximal Assistance - Patient 25 - 49%    Lower Body Dressing/Undressing Lower body dressing      What is the patient wearing?: Pants, Incontinence brief     Lower body assist Assist for lower body dressing: 2 Helpers     Toileting Toileting    Toileting assist Assist for toileting: Dependent - Patient 0%     Transfers Chair/bed transfer  Transfers assist  Chair/bed transfer activity did not occur: Safety/medical concerns  Chair/bed transfer assist level: 2 Helpers     Locomotion Ambulation   Ambulation assist   Ambulation activity did not occur: Safety/medical concerns          Walk 10 feet activity   Assist  Walk 10 feet activity did not occur: Safety/medical concerns        Walk 50 feet activity   Assist Walk 50 feet with 2 turns activity did not occur: Safety/medical concerns         Walk 150 feet activity   Assist Walk 150 feet activity did not occur: Safety/medical concerns         Walk 10 feet on uneven surface  activity   Assist Walk 10 feet on uneven surfaces activity did not occur: Safety/medical concerns         Wheelchair     Assist Will patient use wheelchair at discharge?: Yes Type of Wheelchair: Agricultural engineer  assist level: Supervision/Verbal cueing Max wheelchair distance: 39ft    Wheelchair 50 feet with 2 turns activity    Assist        Assist Level: Supervision/Verbal cueing   Wheelchair 150 feet activity     Assist Wheelchair 150 feet activity did not occur: Safety/medical concerns          Medical Problem List and Plan: 1.  Decreased functional mobility secondary to acute respiratory distress with hypoxic respiratory failure secondary to multi focal pneumonia in the setting of MS.    Follow-up chest x-ray 12/07/2019 bibasilar pulmonary  infiltrates improved from prior exams, repeat chest x-ray on 12/21 personally reviewed showing?  Persistence of infiltrate- monitor at present per Pulm.   Patient completed 10-day course of Augmentin on 12/17  Completed 5-day course of IV Solu-Medrol.    Continue CIR   Palliative care consulted, continue to await recs  Team conference today to discuss current and goals and coordination of care, home and environmental barriers, and discharge planning with nursing, case manager, and therapies.  2.  Antithrombotics: DVT/anticoagulation: Subcutaneous Lovenox.  CT angiogram of chest negative for pulmonary emboli  Creatinine WNL on 12/29  CBC within acceptable range on 12/21, labs pending             Antiplatelet therapy: N/A 3. Pain Management/spasticity: Baclofen 5 mg 3 times daily 4. Mood: Provide emotional support  Appreciate neuropsych eval  Fluoxetine started on 12/18, increased on 12/26             -antipsychotic agents: N/A 5. Neuropsych: This patient is not capable of making decisions on his own behalf. 6. Skin/Wound Care: Routine skin checks/pressure injury to buttocks.  Follow-up skin care checks 7. Fluids/Electrolytes/Nutrition: Routine in and outs 8.  Atrial fibrillation with RVR.  IV Cardizem discontinued due to hypotension.  No current plan for anticoagulation  Monitor with increased mobility. 9.  Neurogenic bowel bladder with recurrent UTIs.  Indwelling Foley catheter tube to remain in place at the recommendations of urology services per plan outpatient evaluation for possible need for suprapubic tube  UA on 12/21 suggestive of infection, urine culture showing Pseudomonas, pansensitive  Ciprofloxacin started on 12/25-1/2, Continue foley  10.  Dysphagia.  Advanced to Dysphagia #2 nectar liquids.  Follow-up speech therapy.    Advance diet as tolerated. 11.AKI:   Creatinine within normal limits on 12/28  Will trial d/c IVF, plan to order labs later this week 12. Spasticity in  setting of MS  Baclofen increased to home dose of 10 mg 3 times daily on 12/17, decreased back to 5 mg 3 times daily on 12/19 due to lethargy, will consider further increase back to home 10 mg as lethargy may be related to UTI  ITB may be an option if patient amenable. 13. Steroid induced hyperglycemia: ?Resolved  Cont to monitor 14.  Hypernatremia  Sodium 142 on 12/28  Cont to monitor 15. Hypoalbuminemia  Supplement initiated on 12/16 16.  Transaminitis  LFTs elevated, but improving on 12/21  Continue to monitor 17. ABLA: Resolved  Hb 13.9 on 12/21  Cont to monitor 18. Constipation  Bowel meds increased on 12/27  Improving  LOS: 15 days A FACE TO FACE EVALUATION WAS PERFORMED  Malikai Gut Karis Juba 12/22/2019, 8:50 AM

## 2019-12-22 NOTE — Progress Notes (Signed)
Patient currently receiving therapy. Will attempt to see patient at a later time today for continued goals of care discussion.   Stephen Wade, AGPCNP-BC Palliative Medicine Team  Phone: [phone]   NO CHARGE

## 2019-12-22 NOTE — Consult Note (Signed)
Neuropsychological Consultation   Patient:   Stephen Wade   DOB:   1951-10-26  MR Number:  [id number]  Location:  [facility] [address] CENTER A [address] [id number] [place] [address] Dept: [phone] Loc: [phone]           Date of Service:   12/22/2019  Start Time:   11 AM End Time:   12 PM  Provider/Observer:  Arley Phenix, Psy.D.       Clinical Neuropsychologist       Billing Code/Service: [id number]/[id number]  Chief Complaint:    Stephen Wade is a 68 year old male with history of MS.  Presented 11/27/2019 with increased weakness decreased functional mobility.  CT/MRI no new intracranial abnormality and stable since 2018 with changes consistent with chronic demyelination.  Pneumonia developed but COVID 19 negative.  Patient with debility, history of depression.  Reason for Service:  The patient was referred for neuropsychological consultation due to coping and adjustment issues with a history of depression.  Below is the HPI for the current admission.  HPI: Stephen Wade is a 62 year old right-handed male with history of multiple sclerosis followed by [name] maintained on Ocrevus 300 mg every six months.  Recurrent lower urinary tract symptoms/self caths followed by [name], remote tobacco abuse.  Per chart review patient lives with spouse.  Two-level home with bed and bath main level with ramped entrance.  He did have a home health aide for some ADLs however aide was recently COVID-19 positive.  Patient primarily used a power scooter prior to admission he was able to do scoot pivot transfers.  He has been essentially nonambulatory for 8 years.  Presented 11/27/2019 with increased weakness decreased functional mobility.   Cranial CT/MRI no new intracranial abnormality stable since 2018 with changes compatible with chronic demyelination.  Chest x-ray did show infiltrate right base representing pneumonia versus  aspiration.  CT angiogram of the chest negative for pulmonary emboli.  Patient was placed on a 5-day course of IV Solu-Medrol for pseudoflare of multiple sclerosis.  Admission chemistries hemoglobin 13.8/hematocrit 42.1, sodium 146, BUN 30, SARS coronavirus negative, urine culture greater 100,000 group B strep ( S.Agalactiae).  EKG showed atrial fibrillation RVR heart rates 120s to 140s he was started on IV Cardizem initially did require nonrebreather mask.  He was transferred to stepdown telemetry 12/01/2019.  His Cardizem was later discontinued due to bouts of hypotension no current plan for anticoagulation at this time.  Initially maintained on IV Rocephin transition to vancomycin and Zosyn.  MRSA negative and Zosyn was changed to Augmentin completing a 10-day course.  A follow-up chest x-ray was completed 12/07/2019 showed bibasilar pulmonary infiltrates improved from prior exams.  In regards to patient's recurrent UTIs now with urinary tract infection a Foley catheter tube was placed plan was to follow-up outpatient urology services for consideration of suprapubic catheter.FOLEY TUBE IS not to be removed.  Subcutaneous Lovenox for DVT prophylaxis.  Noted pressure injury blistered area mid bilateral buttocks with generalized wound care.  Palliative care was consulted to establish goals of care.  Patient is currently on dysphagia #1 nectar thick liquid diet and close monitoring of hydration with lattest BUN 34 up from 31-27-21  and received a fluid bolus 12/07/2019.  Therapy evaluations completed and patient was admitted for a comprehensive rehab program.  Current Status:  Patient more alert today and reports that he has been doing better and is getting better with transfer.  Still very dejected and dysphoric/introspective and life and what his situation is going forward with MS.  He reports that he has struggled for 20 years not and recent worsening has challenged his prior coping resources.  Denies severe  depressive symptoms but endorces feelings of helplessness and hopelessness.    Behavioral Observation: [name]  presents as a 68 y.o.-year-old Right Caucasian Male who appeared his stated age. his dress was Appropriate and he was Well Groomed and his manners were Appropriate to the situation.  his participation was indicative of Appropriate and Redirectable behaviors.  There were any physical disabilities noted.  he displayed an appropriate level of cooperation and motivation.     Interactions:    Active Redirectable  Attention:   abnormal and attention span appeared shorter than expected for age  Memory:   abnormal; global memory impairment noted  Visuo-spatial:  not examined  Speech (Volume):  low  Speech:   slurred; garbled  Thought Process:  Coherent and Relevant  Though Content:  WNL; not suicidal and not homicidal  Orientation:   person, place, time/date and situation  Judgment:   Good  Planning:   Fair  Affect:    Depressed and Lethargic  Mood:    Dysphoric  Insight:   Fair  Intelligence:   normal  Medical History:   Past Medical History:  Diagnosis Date  . GERD (gastroesophageal reflux disease)    had on- off chest pain, symptoms resolved with Prilosec 11/2010  . Lower urinary tract symptoms (LUTS)    Dr Gaynelle Arabian  . MS (multiple sclerosis) ([place]) 2005  . Testicular hypogonadism   . Urge incontinence of urine   . Urolithiasis 2011    Psychiatric History:  Past history of depression  Family Med/Psych History:  Family History  Problem Relation Age of Onset  . Heart attack Mother   . Brain cancer Father   . Colon cancer Neg Hx   . Prostate cancer Neg Hx     Impression/DX:  Stephen Wade is a 68 year old male with history of MS.  Presented 11/27/2019 with increased weakness decreased functional mobility.  CT/MRI no new intracranial abnormality and stable since 2018 with changes consistent with chronic demyelination.  Pneumonia developed but COVID  19 negative.  Patient with debility, history of depression.  Patient more alert today and reports that he has been doing better and is getting better with transfer.  Still very dejected and dysphoric/introspective and life and what his situation is going forward with MS.  He reports that he has struggled for 20 years not and recent worsening has challenged his prior coping resources.  Denies severe depressive symptoms but endorces feelings of helplessness and hopelessness.  Patient is already taking Prozac and trazodone at night.    Will follow-up next week.     Diagnosis:    Debility  Depressive Disorder.        Electronically Signed   _______________________ Ilean Skill, Psy.D.

## 2019-12-22 NOTE — Patient Care Conference (Signed)
Inpatient RehabilitationTeam Conference and Plan of Care Update Date: 12/22/2019   Time: 11:30 AM    Patient Name: Stephen Wade      Medical Record Number: [id number]  Date of Birth: 31-Jul-1951 Sex: Male         Room/Bed: 4W01C/4W01C-01 Payor Info: Payor: MEDICARE / Plan: MEDICARE PART A AND B / Product Type: *No Product type* /    Admit Date/Time:  12/07/2019  3:31 PM  Primary Diagnosis:  Debility  Patient Active Problem List   Diagnosis Date Noted  . Slow transit constipation   . Acute lower UTI   . Neurogenic bladder   . Spasticity   . Depression   . Debility 12/08/2019  . Acute blood loss anemia   . Transaminitis   . Hypoalbuminemia due to protein-calorie malnutrition ([place])   . Hypernatremia   . Steroid-induced hyperglycemia   . AKI (acute kidney injury) ([place])   . Dysphagia   . Acute respiratory failure ([place]) 12/07/2019  . Chronic indwelling Foley catheter 12/07/2019  . Acute respiratory failure with hypoxia ([place]) 11/28/2019  . Pneumonia 11/28/2019  . Atrial fibrillation with RVR ([place]) 11/28/2019  . UTI (urinary tract infection) 11/27/2019  . Acute metabolic encephalopathy 67/34/1937  . Urinary retention 06/22/2019  . PCP NOTES >>>>>>> 05/24/2018  . Depression, recurrent ([place]) 05/24/2018  . History of recurrent UTIs 05/22/2018  . Pressure injury of skin 11/06/2017  . Multiple sclerosis ([place]) 11/04/2017  . Skin ulcer of sacral region ([place]) 05/26/2017  . Vitamin D deficiency 01/16/2016  . Spastic gait 02/10/2015  . Spastic diplegia ([place]) 02/10/2015  . Other fatigue 02/10/2015  . Disturbed cognition 02/10/2015  . Edema 05/12/2014  . Hypogonadism male 03/11/2013  . BPH - self caths 12/26/2011  . GERD 11/30/2010  . URINARY CALCULUS 10/05/2010    Expected Discharge Date: Expected Discharge Date: 01/05/20  Team Members Present: Physician leading conference: Dr. Delice Lesch Social Worker Present: Ovidio Kin, LCSW Nurse Present: Blair Heys, RN Case Manager:  Karene Fry, RN PT Present: Becky Sax, PT OT Present: Simonne Come, OT SLP Present: Jettie Booze, CF-SLP PPS Coordinator present : Gunnar Fusi, SLP     Current Status/Progress Goal Weekly Team Focus  Bowel/Bladder   incontinent of bowel. LBM 12/29. Foley cath in place  less incontinent episodes  assess toileting needs q shift/ prn   Swallow/Nutrition/ Hydration   Dys 2/nectar, improved secretion management, Min A EMST and swallow exercises  Supervision A  tolerance D2/nectar, repeat MBSS, EMST, swallow exercises, water protocol   ADL's   Increased participation on 12/29! Completed slide board transfer Max assist +2, bathed at shower level 12/29 with mod assist from roll in shower chair, +2 LB dressing at bed level. Continues to require mod assist for sitting balance at EOB  Min A overall - will have to downgrade if pt does not begin to show improvement  ADL retraining, transfers, sitting balance, activity tolerance, cogntion, pt/family education   Mobility   rolling max A, supine<>sit +2, slideboard transfers +2  min A transfers, mod A sit<>stand and bed mobility, WC mobility supervision  bed mobility, LE strength, postural control/sitting balance, transfers   Communication   Min-Mod A speech intelligibility strategies  Supervision A  carryover speech intelligibility strategies   Safety/Cognition/ Behavioral Observations  Min-Mod A  Min A  short term recall, basic to semi-complex problem solving   Pain   no c/o of pain  remain free of pain  assess pain q shift/ prn   Skin  MASD to bottom treated with gerhards cream  no new areas of skin break down  assess skin q shift and prn      *See Care Plan and progress notes for long and short-term goals.     Barriers to Discharge  Current Status/Progress Possible Resolutions Date Resolved   Nursing                  PT  Medical stability  Increase in RLE tone              OT                  SLP                SW                 Discharge Planning/Teaching Needs:  Wife here daily and working on hiring assist. Pt able to participate more due to more medical stable this week      Team Discussion: Cipro for UTI, increased tone, adjusting baclofen, stopped IV fluids, checking labs later this week, palliative care consulted.  RN has foley, inc BM, D2nectar, hates nectar thick, water protocol, rash on bottom.  OT +2 slide board max A, shower yesterday min/mod bathing, mod A UB dressing, +2 LB dressing, fatigue, min A goals, downgrade goals to mod A, has significant tone in legs.  PT max roll, +2 EOB, +2 slide board, downgrade to mod A overall goals, ?need a PRAFO for r leg?  SLP D2nectar, MBS for tomorrow, confusion better, decreased STM, goals min A cognition, S otherwise.  Wife to hire help.   Revisions to Treatment Plan: N/A     Medical Summary Current Status: Decreased functional mobility secondary to acute respiratory distress with hypoxic respiratory failure secondary to multi focal pneumonia in the setting of MS. Weekly Focus/Goal: Improve mobility, swallowing, UTI, depression, AKI, spasticity  Barriers to Discharge: Medical stability;Neurogenic Bowel & Bladder;Nutrition means;Wound care   Possible Resolutions to Barriers: Therapies, continue to advance diet as tolerated, follow labs, optimize antispasticity meds in conjunction with lethargy   Continued Need for Acute Rehabilitation Level of Care: The patient requires daily medical management by a physician with specialized training in physical medicine and rehabilitation for the following reasons: Direction of a multidisciplinary physical rehabilitation program to maximize functional independence : Yes Medical management of patient stability for increased activity during participation in an intensive rehabilitation regime.: Yes Analysis of laboratory values and/or radiology reports with any subsequent need for medication adjustment and/or medical intervention. :  Yes   I attest that I was present, lead the team conference, and concur with the assessment and plan of the team.   Lelon Frohlich M 12/22/2019, 9:26 PM  Team conference was held via web/ teleconference due to COVID - 19

## 2019-12-22 NOTE — Plan of Care (Signed)
  Problem: Consults Goal: RH GENERAL PATIENT EDUCATION Description: See Patient Education module for education specifics. Outcome: Progressing   Problem: RH BOWEL ELIMINATION Goal: RH STG MANAGE BOWEL WITH ASSISTANCE Description: STG Manage Bowel with Min Assistance. Outcome: Progressing Flowsheets (Taken 12/22/2019 1731) STG: Pt will manage bowels with assistance: 2-Maximum assistance Goal: RH STG MANAGE BOWEL W/MEDICATION W/ASSISTANCE Description: STG Manage Bowel with Medication with [place]. Outcome: Progressing Flowsheets (Taken 12/22/2019 1731) STG: Pt will manage bowels with medication with assistance: 2-Maximum assistance   Problem: RH BLADDER ELIMINATION Goal: RH STG MANAGE BLADDER WITH ASSISTANCE Description: STG Manage Bladder With max Assistance Outcome: Progressing Flowsheets (Taken 12/22/2019 1731) STG: Pt will manage bladder with assistance: 1-Total assistance Note: Foley cath   Problem: RH SKIN INTEGRITY Goal: RH STG SKIN FREE OF INFECTION/BREAKDOWN Description: Min assist Outcome: Progressing Goal: RH STG MAINTAIN SKIN INTEGRITY WITH ASSISTANCE Description: STG Maintain Skin Integrity With [place]. Outcome: Progressing Flowsheets (Taken 12/22/2019 1731) STG: Maintain skin integrity with assistance: 2-Maximum assistance   Problem: RH SAFETY Goal: RH STG DECREASED RISK OF FALL WITH ASSISTANCE Description: STG Decreased Risk of Fall With [employer]. Outcome: Progressing   Problem: Consults Goal: RH GENERAL PATIENT EDUCATION Description: See Patient Education module for education specifics. Outcome: Progressing   Problem: RH BOWEL ELIMINATION Goal: RH STG MANAGE BOWEL WITH ASSISTANCE Description: STG Manage Bowel with Min Assistance. Outcome: Progressing Flowsheets (Taken 12/22/2019 1731) STG: Pt will manage bowels with assistance: 2-Maximum assistance Goal: RH STG MANAGE BOWEL W/MEDICATION W/ASSISTANCE Description: STG Manage Bowel  with Medication with [place]. Outcome: Progressing Flowsheets (Taken 12/22/2019 1731) STG: Pt will manage bowels with medication with assistance: 2-Maximum assistance   Problem: RH BLADDER ELIMINATION Goal: RH STG MANAGE BLADDER WITH ASSISTANCE Description: STG Manage Bladder With max Assistance Outcome: Progressing Flowsheets (Taken 12/22/2019 1731) STG: Pt will manage bladder with assistance: 1-Total assistance Note: Foley cath   Problem: RH SKIN INTEGRITY Goal: RH STG SKIN FREE OF INFECTION/BREAKDOWN Description: Min assist Outcome: Progressing Goal: RH STG MAINTAIN SKIN INTEGRITY WITH ASSISTANCE Description: STG Maintain Skin Integrity With [place]. Outcome: Progressing Flowsheets (Taken 12/22/2019 1731) STG: Maintain skin integrity with assistance: 2-Maximum assistance   Problem: RH SAFETY Goal: RH STG DECREASED RISK OF FALL WITH ASSISTANCE Description: STG Decreased Risk of Fall With [employer]. Outcome: Progressing

## 2019-12-23 ENCOUNTER — Inpatient Hospital Stay (HOSPITAL_COMMUNITY): Payer: Medicare Other

## 2019-12-23 ENCOUNTER — Inpatient Hospital Stay (HOSPITAL_COMMUNITY): Payer: Medicare Other | Admitting: Occupational Therapy

## 2019-12-23 ENCOUNTER — Encounter (HOSPITAL_COMMUNITY): Payer: Medicare Other | Admitting: Speech Pathology

## 2019-12-23 DIAGNOSIS — Z66 Do not resuscitate: Secondary | ICD-10-CM

## 2019-12-23 NOTE — Progress Notes (Signed)
Physical Therapy Session Note  Patient Details  Name: Stephen Wade MRN: [id number] Date of Birth: March 02, 1951  Today's Date: 12/23/2019 PT Individual Time: 1100-1201 PT Individual Time Calculation (min): 61 min   Short Term Goals: Week 2:  PT Short Term Goal 1 (Week 2): Pt will perform bed mobility with mod assist PT Short Term Goal 1 - Progress (Week 2): Progressing toward goal PT Short Term Goal 2 (Week 2): Pt will transfer to [facility] with mod assist PT Short Term Goal 2 - Progress (Week 2): Progressing toward goal PT Short Term Goal 3 (Week 2): Pt will perform sit<>stand with mod assist and BUE support on rail or in stedy PT Short Term Goal 3 - Progress (Week 2): Progressing toward goal PT Short Term Goal 4 (Week 2): Pt will propel WC >162ft with supevision assist PT Short Term Goal 4 - Progress (Week 2): Progressing toward goal Week 3:  PT Short Term Goal 1 (Week 3): Pt will perform bed mobility with mod assist PT Short Term Goal 2 (Week 3): Pt will transfer to [facility] with mod assist PT Short Term Goal 3 (Week 3): Pt will perform sit<>stand with mod assist and BUE support on rail or in stedy PT Short Term Goal 4 (Week 3): Pt will propel WC >144ft with supevision assist  Skilled Therapeutic Interventions/Progress Updates:   Received pt supine in bed, pt agreeable to therapy, and denied any pain throughout session. Recreational therapy present for session today. Session focused on functional mobility/transfers, postural alignment and trunk control, dynamic sitting balance, discussion regarding goals and motivating factors in life, and improved endurance with activity. Recreational therapy discussed hobbies pt enjoys, and strategies for incorporating meaningful activities into everyday life and strategies to adjust tasks that may be more difficult now based on impairments. Pt engaged in discussion and receptive to information. Pt agreeable to participate in OOB mobility. Pt performed bed mobility  supine<>sit +2 assist. Pt performed slideboard transfer from bed<>WC +2 assist and verbal and tactile cues for head/hips relationship and anterior weight shifting. Pt transported to therapy gym in [place]. Pt worked on dynamic sitting balance and trunk control while constructing images with PVC pipes with mod A for trunk control. Pt required max verbal cues for anterior weight shift. Pt extremely fatigued at end of session and required multiple rest breaks throughout session. Concluded session with pt sitting in TIS WC, needs within reach, no chair alarm on but wife present in room.   Therapy Documentation Precautions:  Precautions Precautions: Fall Precaution Comments: hx MS (R LE deficits > L LE, spastic diplegia) Restrictions Weight Bearing Restrictions: No   Therapy/Group: Individual Therapy Alfonse Alpers PT, DPT   12/23/2019, 7:29 AM

## 2019-12-23 NOTE — Progress Notes (Signed)
Patient refused Protonix. Patient educated.

## 2019-12-23 NOTE — Progress Notes (Signed)
PALLIATIVE NOTE:  Patient awake and alert. Wife is at the bedside. He is eating soup and macaroni and cheese from [employer]. Expressed his continued frustration with not being able to tolerate thin liquids such as water and having to have medications crushed which taste "awful" to him.   He reports he is tired today. Wife is somewhat tearful sharing his differences in enthusiasm each day (some days in more happier spirits than others). Support given.   We discussed goals of care. Stephen Wade reports he is receiving great care and he feel he is doing a little better but worries that it will be enough. He states he wants to keep taking it one day at a time but is ready to go home when it is time. Space and opportunity created as he looks down when he makes this statement. I encouraged him to further express his feelings asking where is home. He responds by saying "home is on the other side!" Wife is tearful in discussion. She shares he has suffered many years with his disease and has been fighting hard and it is hard for her to see him like this. Support given.   Patient states he wishes to continue with therapy and hopes to go home but shares "my time is limited and I am ok with that!" Therapeutic listening and support given. He is tearful in stating as he looks at his wife "you can buy many things but you just can't buy time!" She verbalizes understanding and showing support by touch. Both wife and patient is tearful. Wife validates she understands his struggles. Stephen Wade begins to name family members tearfully that have passed away stating they are waiting on him when it is his time.   He reports he knows he will not leave until God says it is his time, but he is ready when it comes. Until then he states he wishes to take it one day at a time and live his life the best that he can.   I used this opportunity to ask if he wished to continue with aggressive measures and interventions such as therapy. He verbalizes  he wishes to continue again acknowledging "if no improvement or decline he is ready for what is to come!"   Space created to discuss his full code status with consideration to his current illness, co-morbidities, and expressed statements. Stephen Wade states he would not want any heroic measures at this point and would like to pass of a natural death when it occurs. Wife offering support of his statement but is tearful. I educated patient and wife on what a DNR/DNI means. They both verbalized understanding and patient again confirmed wishes. Patient and wife aware code status will be changed in computer and his RN would place a DNR bracelet on him acknowledging his wishes. They verbalized understanding.   Plan -DNR/DNI-as requested by patient/wife at bedside -Continue with current plan of care per attending -Patient verbalizes he is ready for end-of-life when it occurs but wishes to continue with therapy sessions with a goal of getting home.  -Outpatient palliative support with awareness hospice will become most appropriate at some point in the future.  -PMT will continue to support and follow as needed.   Total Time: 50 min.   Greater than 50%  of this time was spent counseling and coordinating care related to the above assessment and plan.  [name], AGPCNP-BC Palliative Medicine Team  Amion: N. Cousar

## 2019-12-23 NOTE — Progress Notes (Signed)
Modified Barium Swallow Progress Note  Patient Details  Name: Stephen Wade MRN: [id number] Date of Birth: 12-Feb-1951  Today's Date: 12/23/2019  Modified Barium Swallow completed.  Full report located under Chart Review in the Imaging Section.  Brief recommendations include the following:  Clinical Impression Pt presents with severe oropharyngeal swallow function largely unchanged since last MBSS 11/30/19, characterized primarily by consistent silent aspiration of thin liquids. Although pt's swallow initiation was mostly timely with thin liquids, his epiglottis does not fully invert and laryngeal vestibule closure is incomplete, resulting in aspiration which was never sensed by the pt. Unfortunately, chin tuck compensatory strategy was ineffective to provide any additional airway protection with thin. Volitional cough remains mostly weak and congested and inefficient to completely clear aspirates. Pt's airway protection significantly improved with trials of nectar thick barium; aspiration prior to swallow initiation noted in only 1 out of 4 trials, due to delayed initiation of swallow sequence to the level of the pyriform sinuses. However, nectar aspirate was sensed by pt, evidenced by reflexive cough response. One additional instance of penetration of nectar into laryngeal vestibule noted without ejection noted. Pt's oral phase was remarkable for some oral holding and reduced bolus cohesion resulting in intermittent piecemeal swallowing of thin and nectar thick liquids. Pt demonstrated Min lingual pumping and slightly delayed oral transit of Dys 3 (mech soft) solids, however airway protection intact. Recommend pt upgrade to Dys 3 texture diet, continue nectar thick liquids, medications crushed in puree. Please ensure full supervision to ensure use of swallow strategies - slow rate, small bites/sips, intermittent dry swallow and cough/throat clear. ST will continue to follow to provide skilled  interventions for dysphagia and ensure diet safety and efficiency.      Swallow Evaluation Recommendations       SLP Diet Recommendations: Dysphagia 3 (Mech soft) solids;Nectar thick liquid   Liquid Administration via: Cup;Spoon   Medication Administration: Crushed with puree   Supervision: Full supervision/cueing for compensatory strategies   Compensations: Slow rate;Small sips/bites;Other (Comment)(intermittent dry swallow and volitional cough/throat clear)   Postural Changes: Seated upright at 90 degrees;Remain semi-upright after after feeds/meals (Comment)   Oral Care Recommendations: Oral care BID;Oral care before and after PO;Staff/trained caregiver to provide oral care   Other Recommendations: Order thickener from pharmacy;Have oral suction available    Arbutus Leas 12/23/2019,11:24 AM

## 2019-12-23 NOTE — Plan of Care (Signed)
  Problem: RH BLADDER ELIMINATION Goal: RH STG MANAGE BLADDER WITH ASSISTANCE Description: STG Manage Bladder With max Assistance Outcome: Not Progressing; foley    

## 2019-12-23 NOTE — Evaluation (Signed)
Recreational Therapy Assessment and Plan  Patient Details  Name: Stephen Wade MRN: [id number] Date of Birth: February 18, 1951 Today's Date: 12/23/2019  Rehab Potential: Good ELOS: d/c 1/13   Assessment  Problem List:      Patient Active Problem List   Diagnosis Date Noted  . Acute blood loss anemia   . Transaminitis   . Hypoalbuminemia due to protein-calorie malnutrition ([place])   . Hypernatremia   . Steroid-induced hyperglycemia   . AKI (acute kidney injury) ([place])   . Dysphagia   . Acute respiratory failure ([place]) 12/07/2019  . Chronic indwelling Foley catheter 12/07/2019  . Acute respiratory failure with hypoxia ([place]) 11/28/2019  . Pneumonia 11/28/2019  . Atrial fibrillation with RVR ([place]) 11/28/2019  . UTI (urinary tract infection) 11/27/2019  . Acute metabolic encephalopathy 34/19/6222  . Urinary retention 06/22/2019  . PCP NOTES >>>>>>> 05/24/2018  . Depression, recurrent ([place]) 05/24/2018  . History of recurrent UTIs 05/22/2018  . Pressure injury of skin 11/06/2017  . Multiple sclerosis ([place]) 11/04/2017  . Skin ulcer of sacral region ([place]) 05/26/2017  . Vitamin D deficiency 01/16/2016  . Spastic gait 02/10/2015  . Spastic diplegia ([place]) 02/10/2015  . Other fatigue 02/10/2015  . Disturbed cognition 02/10/2015  . Edema 05/12/2014  . Hypogonadism male 03/11/2013  . BPH - self caths 12/26/2011  . GERD 11/30/2010  . URINARY CALCULUS 10/05/2010    Past Medical History:      Past Medical History:  Diagnosis Date  . GERD (gastroesophageal reflux disease)    had on- off chest pain, symptoms resolved with Prilosec 11/2010  . Lower urinary tract symptoms (LUTS)    Dr Gaynelle Arabian  . MS (multiple sclerosis) ([place]) 2005  . Testicular hypogonadism   . Urge incontinence of urine   . Urolithiasis 2011   Past Surgical History:  Past Surgical History:  Procedure Laterality Date  . APPENDECTOMY      ruptured 03-2010  . VASECTOMY      Assessment &  Plan Clinical Impression: Patient is a  68 year old right-handed male with history of multiple sclerosis followed by [name] maintained on Ocrevus 300 mg every six months. Recurrent lower urinary tract symptoms/self caths followed by Dr. Gaynelle Arabian, remote tobacco abuse. Per chart review patient lives with spouse. Two-level home with bed and bath main level with ramped entrance. He did have a home health aide for some ADLs however aide was recently COVID-19 positive. Patient primarily used a power scooter prior to admission he was able to do scoot pivot transfers. He has been essentially nonambulatory for 8 years. Presented 11/27/2019 with increased weakness decreased functional mobility. Cranial CT/MRI no new intracranial abnormality stable since 2018 with changes compatible with chronic demyelination. Chest x-ray did show infiltrate right base representing pneumonia versus aspiration. CT angiogram of the chest negative for pulmonary emboli. Patient was placed on a 5-day course of IV Solu-Medrol for pseudoflare of multiple sclerosis. Admission chemistries hemoglobin 13.8/hematocrit 42.1, sodium 146, BUN 30, SARS coronavirus negative, urine culture greater 100,000 group B strep ( S.Agalactiae). EKG showed atrial fibrillation RVR heart rates 120s to 140s he was started on IV Cardizem initially did require nonrebreather mask. He was transferred to stepdown telemetry 12/01/2019. His Cardizem was later discontinued due to bouts of hypotension no current plan for anticoagulation at this time. Initially maintained on IV Rocephin transition to vancomycin and Zosyn. MRSA negative and Zosyn was changed to Augmentin completing a 10-day course. A follow-up chest x-ray was completed 12/07/2019 showed bibasilar pulmonary infiltrates improved from prior  exams. In regards to patient's recurrent UTIs now with urinary tract infection a Foley catheter tube was placed plan was to follow-up outpatient urology  services for consideration of suprapubic catheter.FOLEY TUBE IS not to be removed. Subcutaneous Lovenox for DVT prophylaxis. Noted pressure injury blistered area mid bilateral buttocks with generalized wound care. Palliative care was consulted to establish goals of care. Patient is currently on dysphagia #1 nectar thick liquid diet and close monitoring of hydration with lattest BUN 34 up from 31-27-21 and received a fluid bolus 12/07/2019.   Patient transferred to CIR on 12/07/2019 .   Pt referred by treatment team to assist with stress management and leisure awareness/adaptations.  Pt presents with decreased activity tolerance, decreased functional mobility, decreased balance, decreased coordination, decreased problem solving, decreased safety awareness, decreased awareness of leisure adaptations Limiting pt's independence with leisure/community pursuits.  Leisure History/Participation Premorbid leisure interest/current participation: Sports - Exercise (Comment);Sports - Golf;Games - Other (Comment)(tennis, loves being outside) Leisure Participation Style: With Family/Friends Awareness of Community Resources: Good-identify 3 post discharge leisure resources Psychosocial / Spiritual Does patient have pets?: No Social interaction - Mood/Behavior: Cooperative Strengths/Weaknesses Patient Strengths/Abilities: Willingness to participate Patient weaknesses: Physical limitations;Minimal Premorbid Leisure Activity TR Patient demonstrates impairments in the following area(s): Endurance;Motor;Safety;Skin Integrity  Plan Rec Therapy Plan Is patient appropriate for Therapeutic Recreation?: Yes Rehab Potential: Good Treatment times per week: Min 1 TR session >20 minutes during LOS Estimated Length of Stay: d/c 1/13 TR Treatment/Interventions: Adaptive equipment instruction;Patient/family education;Therapeutic exercise;1:1 session;Functional mobility training;UE/LE Coordination  activities;Balance/vestibular training;Recreation/leisure participation;Leisure education;Therapeutic activities;Wheelchair propulsion/positioning Recommendations for other services: Neuropsych  Recommendations for other services: Neuropsych  Discharge Criteria: Patient will be discharged from TR if patient refuses treatment 3 consecutive times without medical reason.  If treatment goals not met, if there is a change in medical status, if patient makes no progress towards goals or if patient is discharged from hospital.  The above assessment, treatment plan, treatment alternatives and goals were discussed and mutually agreed upon: by patient  [place] 12/23/2019, 12:30 PM

## 2019-12-23 NOTE — Progress Notes (Signed)
Speech Language Pathology Daily Session Note  Patient Details  Name: Stephen Wade MRN: [id number] Date of Birth: 1951-10-14  Today's Date: 12/23/2019 SLP Individual Time: 1030-1100 SLP Individual Time Calculation (min): 30 min  Short Term Goals: Week 3: SLP Short Term Goal 1 (Week 3): Pt will consume least restrictive diet with minimal overt s/sx aspiration, efficient mastication and oral clearance, and Supervision A for use of swallow strategies. SLP Short Term Goal 2 (Week 3): Pt will demonstrate efficient mastication and oral clearnace of Dys 3 (mech soft) solids X2 prior to upgrade. SLP Short Term Goal 3 (Week 3): Pt will participate in instrumental MBSS to reassess oropharyngeal swallow function and assess readiness for liquid advancement. SLP Short Term Goal 4 (Week 3): Pt will demonstrate recall of new/day to day information with Min A verbal/visual cues for use of compensatory strategies and/or external aids. SLP Short Term Goal 5 (Week 3): Pt will demonstrate ability to problem solve during functional mildly complex tasks with Min A verbal/visual cues. SLP Short Term Goal 6 (Week 3): Pt will use speech intelligibility strategies with Supervision A verbal cues to achieve 80% intelligibility at the sentence level.  Skilled Therapeutic Interventions: Pt was seen for skilled ST targeting education with pt and his wife regarding swallow function and strategies. SLP provided detailed review of pt's MBSS video as well as verbal explanation regarding swallow anatomy and pt's specific impairments, including silent aspiration of thin liquids regardless of compensatory strategies. SLP further educated pt and wife regarding risks of aspiration and potential for development of aspiration pneumonia. Given pt's recent PNA, limited mobility, MS dx, he is at a moderate-severe risk for aspiration. Reviewed recommendations to continue nectar thick liquids, upgrading to Dys 3 based on improvements in oral  phase mastication, lingual manipulation, and oral clearance. Pt's wife expressed desire for pt to continue nectar thick liquid as safest option at this time, however pt is extremely reluctant and expressed discontent with this recommendation. He wishes to continue the water protocol despite risks and likely aspiration of thin. SLP emphasized the importance of strict oral care prior to PO consumption, waiting at least 30 minutes after last PO consumption, need for intermittent throat clear and extra dry swallows, teaspoon bolus presentation. Also spoke with RN regarding diet recommendations. Pt left laying in bed with alarm set and wife still present. Continue per current plan of care.       Pain Pain Assessment Pain Scale: Faces Faces Pain Scale: No hurt  Therapy/Group: Individual Therapy  Arbutus Leas 12/23/2019, 11:38 AM

## 2019-12-23 NOTE — Progress Notes (Signed)
[place] PHYSICAL MEDICINE & REHABILITATION PROGRESS NOTE  Subjective/Complaints: Patient seen laying in bed this morning.  He states he slept well overnight.  He states he was seen by palliative care yesterday.  He denies increasing lethargy.  ROS: Denies CP, SOB, N/V/D  Objective: Vital Signs: Blood pressure 118/83, pulse 90, temperature 97.8 F (36.6 C), temperature source Oral, resp. rate 16, height 6' (1.829 m), weight 88.5 kg, SpO2 95 %. No results found. Recent Labs    12/22/19 1103  WBC 6.3  HGB 13.7  HCT 41.4  PLT 367   Recent Labs    12/21/19 0816  CREATININE 0.80    Physical Exam: BP 118/83   Pulse 90   Temp 97.8 F (36.6 C) (Oral)   Resp 16   Ht 6' (1.829 m)   Wt 88.5 kg   SpO2 95%   BMI 26.46 kg/m   Constitutional: No distress . Vital signs reviewed. HENT: Normocephalic.  Atraumatic. Eyes: EOMI. No discharge. Cardiovascular: No JVD. Respiratory: Normal effort.  No stridor. GI: Non-distended. Skin: Warm and dry.  Intact. Psych: Flat. Musc: Lower extremity edema unchanged GU: Foley. Neuro: Alert Dysarthria, unchanged Motor:  Right lower extremity: 1/5 proximal distal, stable Left lower extremity: Hip flexion, knee extension 1+/5, ankle dorsiflexion 1+/5, stable Extensor tone b/l LE R>L, unchanged  Assessment/Plan: 1. Functional deficits secondary to debility which require 3+ hours per day of interdisciplinary therapy in a comprehensive inpatient rehab setting.  Physiatrist is providing close team supervision and 24 hour management of active medical problems listed below.  Physiatrist and rehab team continue to assess barriers to discharge/monitor patient progress toward functional and medical goals  Care Tool:  Bathing    Body parts bathed by patient: Right arm, Left arm, Chest, Abdomen, Front perineal area, Right upper leg, Left upper leg, Face   Body parts bathed by helper: Buttocks, Right lower leg, Left lower leg     Bathing  assist Assist Level: Moderate Assistance - Patient 50 - 74%     Upper Body Dressing/Undressing Upper body dressing   What is the patient wearing?: Pull over shirt    Upper body assist Assist Level: Maximal Assistance - Patient 25 - 49%    Lower Body Dressing/Undressing Lower body dressing      What is the patient wearing?: Pants, Incontinence brief     Lower body assist Assist for lower body dressing: 2 Helpers     Toileting Toileting    Toileting assist Assist for toileting: Dependent - Patient 0%     Transfers Chair/bed transfer  Transfers assist  Chair/bed transfer activity did not occur: Safety/medical concerns  Chair/bed transfer assist level: 2 Helpers     Locomotion Ambulation   Ambulation assist   Ambulation activity did not occur: Safety/medical concerns          Walk 10 feet activity   Assist  Walk 10 feet activity did not occur: Safety/medical concerns        Walk 50 feet activity   Assist Walk 50 feet with 2 turns activity did not occur: Safety/medical concerns         Walk 150 feet activity   Assist Walk 150 feet activity did not occur: Safety/medical concerns         Walk 10 feet on uneven surface  activity   Assist Walk 10 feet on uneven surfaces activity did not occur: Safety/medical concerns         Wheelchair     Assist Will patient  use wheelchair at discharge?: Yes Type of Wheelchair: Manual    Wheelchair assist level: Supervision/Verbal cueing Max wheelchair distance: 76ft    Wheelchair 50 feet with 2 turns activity    Assist        Assist Level: Supervision/Verbal cueing   Wheelchair 150 feet activity     Assist Wheelchair 150 feet activity did not occur: Safety/medical concerns          Medical Problem List and Plan: 1.  Decreased functional mobility secondary to acute respiratory distress with hypoxic respiratory failure secondary to multi focal pneumonia in the setting of  MS.    Follow-up chest x-ray 12/07/2019 bibasilar pulmonary infiltrates improved from prior exams, repeat chest x-ray on 12/21 personally reviewed showing?  Persistence of infiltrate- monitor at present per Pulm.   Patient completed 10-day course of Augmentin on 12/17  Completed 5-day course of IV Solu-Medrol.    Continue CIR   Palliative care consulted, limited visit on 12/30, await further follow-up 2.  Antithrombotics: DVT/anticoagulation: Subcutaneous Lovenox.  CT angiogram of chest negative for pulmonary emboli  Creatinine WNL on 12/30             Antiplatelet therapy: N/A 3. Pain Management/spasticity: Baclofen 5 mg 3 times daily 4. Mood: Provide emotional support  Appreciate neuropsych eval  Fluoxetine started on 12/18, increased on 12/26   Continues to be depressed             -antipsychotic agents: N/A 5. Neuropsych: This patient is not capable of making decisions on his own behalf. 6. Skin/Wound Care: Routine skin checks/pressure injury to buttocks.  Follow-up skin care checks 7. Fluids/Electrolytes/Nutrition: Routine in and outs 8.  Atrial fibrillation with RVR.  IV Cardizem discontinued due to hypotension.  No current plan for anticoagulation  Monitor with increased mobility. 9.  Neurogenic bowel bladder with recurrent UTIs.  Indwelling Foley catheter tube to remain in place at the recommendations of urology services per plan outpatient evaluation for possible need for suprapubic tube  UA on 12/21 suggestive of infection, urine culture showing Pseudomonas, pansensitive  Ciprofloxacin started on 12/25-1/2, Continue foley  10.  Dysphagia.  Advanced to Dysphagia #2 nectar liquids.    MBS planned for today   Advance diet as tolerated. 11.AKI:   Creatinine within normal limits on 12/28, labs ordered for tomorrow  Will trial d/c IVF 12. Spasticity in setting of MS  Baclofen increased to home dose of 10 mg 3 times daily on 12/17, decreased back to 5 mg 3 times daily on 12/19 due  to lethargy, increased back to 10 mg on 12/30.    Monitor for lethargy  ITB may be an option if patient amenable. 13. Steroid induced hyperglycemia: ?Resolved  Cont to monitor 14.  Hypernatremia  Sodium 142 on 12/28  Cont to monitor 15. Hypoalbuminemia  Supplement initiated on 12/16 16.  Transaminitis  LFTs elevated, but improving on 12/21  Continue to monitor 17. ABLA: Resolved  Hb 13.9 on 12/21  Cont to monitor 18. Constipation  Bowel meds increased on 12/27  Improving  LOS: 16 days A FACE TO FACE EVALUATION WAS PERFORMED  Orvile Corona Lorie Phenix 12/23/2019, 8:30 AM

## 2019-12-23 NOTE — Progress Notes (Signed)
Occupational Therapy Session Note  Patient Details  Name: Stephen Wade MRN: [id number] Date of Birth: 18-Apr-1951  Today's Date: 12/23/2019 OT Individual Time: 1305-1400 OT Individual Time Calculation (min): 55 min    Short Term Goals: Week 1:  OT Short Term Goal 1 (Week 1): Pt will don shirt with mod A OT Short Term Goal 1 - Progress (Week 1): Not met OT Short Term Goal 2 (Week 1): Pt will maintain EOB sitting balance with mod A OT Short Term Goal 2 - Progress (Week 1): Not met OT Short Term Goal 3 (Week 1): Pt will complete LB dressing with max A OT Short Term Goal 3 - Progress (Week 1): Not met Week 2:  OT Short Term Goal 1 (Week 2): Pt will don shirt with mod A OT Short Term Goal 1 - Progress (Week 2): Met OT Short Term Goal 2 (Week 2): Pt will maintain EOB sitting balance with mod A OT Short Term Goal 2 - Progress (Week 2): Met OT Short Term Goal 3 (Week 2): Pt will complete LB dressing with max A OT Short Term Goal 3 - Progress (Week 2): Progressing toward goal OT Short Term Goal 4 (Week 2): Pt will complete transfer bed > roll in shower chair with max assist of one caregiver OT Short Term Goal 4 - Progress (Week 2): Progressing toward goal  Skilled Therapeutic Interventions/Progress Updates:    Pt received in bed with spouse in the room with patient.  Pt requested to "wash up" but was too tired to shower.  See ADL documentation below for details. Focused on UB strength and trunk control with rolling in bed using LLE as much as possible to assist him with rolling. Worked on LB bathing and dressing bed level with rolling with pt helping to wash perineal area and tops of thighs and he assisted with pulling pants up.   Sat to EOB with total A of 2 but then needed total A of 2 to sit as he was in trunk extension leaning back with his hips pushing forward.  Placed feet on stedy and pt pulled to stand with max A of 2 (NT changing sheets) but R leg went into flexor synergy with foot coming  completely off stedy pad. Unable to reposition even with multiple attempts.  Quickly got pt back to bed and positioned for UB self care.   Discussed with wife and pt the need for nursing to use maxilift as when his spasticity increased it is not a safe transfers.   Pt very tired at end of session, but tolerated it well.   Therapy Documentation Precautions:  Precautions Precautions: Fall Precaution Comments: hx MS (R LE deficits > L LE, spastic diplegia) Restrictions Weight Bearing Restrictions: No      Pain: Pain Assessment Pain Scale: Faces Pain Score: 0-No pain Faces Pain Scale: No hurt ADL: ADL Eating: Minimal assistance Where Assessed-Eating: Bed level Grooming: Minimal assistance Where Assessed-Grooming: Bed level Upper Body Bathing: Minimal assistance Where Assessed-Upper Body Bathing: Edge of bed Lower Body Bathing: Maximal assistance Where Assessed-Lower Body Bathing: Bed level Upper Body Dressing: Moderate assistance Where Assessed-Upper Body Dressing: Edge of bed Lower Body Dressing: Dependent Where Assessed-Lower Body Dressing: Bed level Toileting: Dependent Where Assessed-Toileting: Bed level Toilet Transfer: Unable to assess Toilet Transfer Method: Unable to assess Tub/Shower Transfer: Unable to assess   Therapy/Group: Individual Therapy  [place] 12/23/2019, 2:06 PM

## 2019-12-24 ENCOUNTER — Inpatient Hospital Stay (HOSPITAL_COMMUNITY): Payer: Medicare Other | Admitting: Occupational Therapy

## 2019-12-24 ENCOUNTER — Ambulatory Visit (HOSPITAL_COMMUNITY): Payer: Medicare Other | Admitting: *Deleted

## 2019-12-24 LAB — BASIC METABOLIC PANEL
Anion gap: 10 (ref 5–15)
BUN: 21 mg/dL (ref 8–23)
CO2: 25 mmol/L (ref 22–32)
Calcium: 9.5 mg/dL (ref 8.9–10.3)
Chloride: 107 mmol/L (ref 98–111)
Creatinine, Ser: 0.71 mg/dL (ref 0.61–1.24)
GFR calc Af Amer: >60 mL/min (ref >60)
GFR calc non Af Amer: >60 mL/min (ref >60)
Glucose, Bld: 109 mg/dL — ABNORMAL HIGH (ref 70–99)
Potassium: 3.8 mmol/L (ref 3.5–5.1)
Sodium: 142 mmol/L (ref 135–145)

## 2019-12-24 NOTE — Progress Notes (Signed)
Occupational Therapy Session Note  Patient Details  Name: Stephen Wade MRN: [id number] Date of Birth: 08-16-1951  Today's Date: 12/24/2019 OT Individual Time: 1300-1400 OT Individual Time Calculation (min): 60 min    Short Term Goals: Week 3:  OT Short Term Goal 1 (Week 3): Pt will complete LB dressing with max A OT Short Term Goal 2 (Week 3): Pt will complete transfer bed > roll in shower chair/wheelchair with max assist of one caregiver OT Short Term Goal 3 (Week 3): Pt will complete UB dressing with min A OT Short Term Goal 4 (Week 3): Pt will maintain dynamic sitting balance with Mod assist during self-care tasks  Skilled Therapeutic Interventions/Progress Updates:    Treatment session with focus on functional transfers, dynamic sitting balance, and bed mobility during self-care tasks.  Pt agreeable to shower this session.  Completed bed mobility with mod assist with bed rails. Max assist +2 for sidelying to sitting at EOB.  Pt with improved sitting balance this session, still requiring mod assist but with decreased Rt lean in unsupported sitting at EOB.  Completed slide board transfer Max assist +2 on to roll in shower chair.  Engaged in mini sit > stands with mod assist with pt able to push up from arm rests to clear buttocks to allow 2nd person to pull pants down over hips.  Pt completed bathing with assist to wash buttocks and lower legs.  Mod assist for weight shifting to come to sitting upright, pt continues to posterior pelvic tilt when seated back in chair.  Transferred back to bed sit > stand with Stedy, mod assist +2 for sit > stand.  Engaged in LB dressing from bed level with rolling Rt and Lt with mod assist with use of bed rails.  Continues to require +2 for LB dressing due to tone in BLE and inability to lift and maintain legs lifted for clothing management.  Pt remained in sidelying to allow for pressure relief on buttocks.  Therapy Documentation Precautions:   Precautions Precautions: Fall Precaution Comments: hx MS (R LE deficits > L LE, spastic diplegia) Restrictions Weight Bearing Restrictions: No General:   Vital Signs: Therapy Vitals Temp: (!) 96.8 F (36 C) Pulse Rate: 71 Resp: 20 BP: 121/72 Patient Position (if appropriate): Lying Oxygen Therapy SpO2: 100 % O2 Device: Room Air Pain:  Pt with no c/o pain   Therapy/Group: Individual Therapy  Rosalio Loud 12/24/2019, 3:32 PM

## 2019-12-24 NOTE — Progress Notes (Addendum)
Patient refused CHG bath @ 0500. Patient also refused vital signs, stating it was "too early." Patient re-asked around 06:30 by Nurse Tech and then again by RN. Refused both times.

## 2019-12-24 NOTE — Progress Notes (Signed)
Physical Therapy Session Note  Patient Details  Name: Stephen Wade MRN: [id number] Date of Birth: November 30, 1951  Today's Date: 12/24/2019 PT Individual Time: 0800-0900 PT Individual Time Calculation (min): 60 min   Short Term Goals: Week 2:  PT Short Term Goal 1 (Week 2): Pt will perform bed mobility with mod assist PT Short Term Goal 1 - Progress (Week 2): Progressing toward goal PT Short Term Goal 2 (Week 2): Pt will transfer to [facility] with mod assist PT Short Term Goal 2 - Progress (Week 2): Progressing toward goal PT Short Term Goal 3 (Week 2): Pt will perform sit<>stand with mod assist and BUE support on rail or in stedy PT Short Term Goal 3 - Progress (Week 2): Progressing toward goal PT Short Term Goal 4 (Week 2): Pt will propel WC >132ft with supevision assist PT Short Term Goal 4 - Progress (Week 2): Progressing toward goal Week 3:  PT Short Term Goal 1 (Week 3): Pt will perform bed mobility with mod assist PT Short Term Goal 2 (Week 3): Pt will transfer to [facility] with mod assist PT Short Term Goal 3 (Week 3): Pt will perform sit<>stand with mod assist and BUE support on rail or in stedy PT Short Term Goal 4 (Week 3): Pt will propel WC >182ft with supevision assist  Skilled Therapeutic Interventions/Progress Updates:   Received pt supine in bed with NT and RN present administering medication and providing ice chips, pt agreeable to therapy, and denied any pain throughout session. Recreational therapy present during today's session. Session focused on functional mobility/transfers, dynamic sitting balance, postural alignment and trunk control, and improved endurance to upright activity. Pt rolled to L and R with mod A and bedrails and +2 to position sling. Pt transferred supine<>sit and bed<>TIS WC via Maximove lift +2 for improved pt comfort with lift and for energy conservation purposes. Pt required +2 to reposition in chair. Therapist provided pt with water from teaspoon and ice chips; pt  coughing. Pt performed oral care afterwards. Pt transported to therapy gym in TIS for time management purposes. Pt transferred TIS WC<>mat and mat<>TIS WC via slideboard +2 x 2 trials throughout session. Pt continues to require max cues for anterior weight shift and for head/hips relationship when scooting and demonstrated poor carry over with task. Seated on mat pt performed static and dynamic sitting balance with and without UE support mod A. Pt able to lean forward onto elbows x 5 reps however unable to remain there long due to poor trunk control and fear of leaning forwards. Pt performed dynamic reaching outside BOS towards therapist hands x 5 bilaterally with mod A for balance. Pt required multiple rest breaks throughout session. Pt threw horseshoes x 2 trials seated without UE support mod A. Pt reported multiple times throughout session that he was exhausted, PT educated pt on importance of OOB mobility and pt receptive. Pt agreed to remain in chair for at least an hour at end of session. Concluded session with pt sitting in TIS, needs within reach, and seatbelt alarm on.   Therapy Documentation Precautions:  Precautions Precautions: Fall Precaution Comments: hx MS (R LE deficits > L LE, spastic diplegia) Restrictions Weight Bearing Restrictions: No   Therapy/Group: Individual Therapy Martin Majestic PT, DPT   12/24/2019, 7:34 AM

## 2019-12-24 NOTE — Progress Notes (Signed)
Recreational Therapy Session Note  Patient Details  Name: Stephen Wade MRN: [id number] Date of Birth: 06-19-1951 Today's Date: 12/24/2019 Time:  804-9 Pain: no c/o but indicates discomfort with moving legs during mobility tasks due to tightness, relieved with repositioning. Skilled Therapeutic Interventions/Progress Updates: session focused on bed mobility, transfers, dynamic sitting balance & activity tolerance during co-treat with PT.  Pt required mod assist for bed mobility & +2 assist to place sling in preparation for lift transfer.  Pt transferred from bed to w/c with Maxi move, +2 asistance for energy conservation.  Pt taken to the therapy gym & performed slide board transfers x2 with +2 assistance, max cues for technique and safe positioning.  Once seated EOM, pt tossed horseshoes with a focus on anterior weight shifting.  Pt required mod assist for balance seated EOM reaching outside of BOS for horseshoes.  Pt fatigues quickly needing frequent rest reclining onto therapy ball while seated EOM.  Therapy/Group: Co-Treatment   Valli Randol 12/24/2019, 10:54 AM

## 2019-12-24 NOTE — Progress Notes (Signed)
[facility] PHYSICAL MEDICINE & REHABILITATION PROGRESS NOTE  Subjective/Complaints: Patient seen laying in bed this morning.  He states he slept well overnight.  Denies complaints.  He was seen by palliative care yesterday, notes reviewed.  ROS: Denies CP, SOB, N/V/D  Objective: Vital Signs: Blood pressure 132/72, pulse 90, temperature 97.7 F (36.5 C), resp. rate 17, height 6' (1.829 m), weight 88.5 kg, SpO2 95 %. DG Swallowing Func-Speech Pathology  Result Date: 12/23/2019 Objective Swallowing Evaluation: Type of Study: MBS-Modified Barium Swallow Study  Patient Details Name: Stephen Wade MRN: [id number] Date of Birth: 27-Sep-1951 Today's Date: 12/23/2019 Time: SLP Start Time (ACUTE ONLY): 1000 -SLP Stop Time (ACUTE ONLY): 1015 SLP Time Calculation (min) (ACUTE ONLY): 15 min Past Medical History: Past Medical History: Diagnosis Date . GERD (gastroesophageal reflux disease)   had on- off chest pain, symptoms resolved with Prilosec 11/2010 . Lower urinary tract symptoms (LUTS)   Dr Patsi Sears . MS (multiple sclerosis) (HCC) 2005 . Testicular hypogonadism  . Urge incontinence of urine  . Urolithiasis 2011 Past Surgical History: Past Surgical History: Procedure Laterality Date . APPENDECTOMY     ruptured 03-2010 . VASECTOMY   HPI: HPI: Stephen Wade is a 69 year old right-handed male with history of multiple sclerosis followed by [name] maintained on Ocrevus 300 mg every six months.  Recurrent lower urinary tract symptoms/self caths followed by [name], remote tobacco abuse.  Per chart review patient lives with spouse.  Two-level home with bed and bath main level with ramped entrance.  He did have a home health aide for some ADLs however aide was recently COVID-19 positive.  Patient primarily used a power scooter prior to admission he was able to do scoot pivot transfers.  He has been essentially nonambulatory for 8 years.  Presented 11/27/2019 with increased weakness decreased functional  mobility.   Cranial CT/MRI no new intracranial abnormality stable since 2018 with changes compatible with chronic demyelination.  Chest x-ray did show infiltrate right base representing pneumonia versus aspiration.  CT angiogram of the chest negative for pulmonary emboli.  Patient was placed on a 5-day course of IV Solu-Medrol for pseudoflare of multiple sclerosis.  Admission chemistries hemoglobin 13.8/hematocrit 42.1, sodium 146, BUN 30, SARS coronavirus negative, urine culture greater 100,000 group B strep ( S.Agalactiae).  EKG showed atrial fibrillation RVR heart rates 120s to 140s he was started on IV Cardizem initially did require nonrebreather mask.  He was transferred to stepdown telemetry 12/01/2019.  His Cardizem was later discontinued due to bouts of hypotension no current plan for anticoagulation at this time.  Initially maintained on IV Rocephin transition to vancomycin and Zosyn.  MRSA negative and Zosyn was changed to Augmentin completing a 10-day course.  A follow-up chest x-ray was completed 12/07/2019 showed bibasilar pulmonary infiltrates improved from prior exams.  In regards to patient's recurrent UTIs now with urinary tract infection a Foley catheter tube was placed plan was to follow-up outpatient urology services for consideration of suprapubic catheter.FOLEY TUBE IS not to be removed.  Subcutaneous Lovenox for DVT prophylaxis.  Noted pressure injury blistered area mid bilateral buttocks with generalized wound care.  Palliative care was consulted to establish goals of care.  Patient is currently on dysphagia #1 nectar thick liquid diet and close monitoring of hydration with lattest BUN 34 up from 31-27-21  and received a fluid bolus 12/07/2019.  Assessment / Plan / Recommendation CHL IP CLINICAL IMPRESSIONS 12/23/2019 Clinical Impression -- Pt presents with severe oropharyngeal swallow function largely unchanged since last  MBSS 11/30/19, characterized primarily by consistent silent aspiration  of thin liquids. Although pt's swallow initiation was mostly timely with thin liquids, his epiglottis does not fully invert and laryngeal vestibule closure is incomplete, resulting in aspiration which was never sensed by the pt. Unfortunately, chin tuck compensatory strategy was ineffective to provide any additional airway protection with thin. Volitional cough remains mostly weak and congested and inefficient to completely clear aspirates. Pt's airway protection significantly improved with trials of nectar thick barium; aspiration prior to swallow initiation noted in only 1 out of 4 trials, due to delayed initiation of swallow sequence to the level of the pyriform sinuses. However, nectar aspirate was sensed by pt, evidenced by reflexive cough response. One additional instance of penetration of nectar into laryngeal vestibule noted without ejection noted. Pt's oral phase was remarkable for some oral holding and reduced bolus cohesion resulting in intermittent piecemeal swallowing of thin and nectar thick liquids. Pt demonstrated Min lingual pumping and slightly delayed oral transit of Dys 3 (mech soft) solids, however airway protection intact. Recommend pt upgrade to Dys 3 texture diet, continue nectar thick liquids, medications crushed in puree. Please ensure full supervision to ensure use of swallow strategies - slow rate, small bites/sips, intermittent dry swallow and cough/throat clear. ST will continue to follow to provide skilled interventions for dysphagia and ensure diet safety and efficiency. SLP Visit Diagnosis Dysphagia, oropharyngeal phase (R13.12) Attention and concentration deficit following -- Frontal lobe and executive function deficit following -- Impact on safety and function Risk for inadequate nutrition/hydration;Moderate aspiration risk   CHL IP TREATMENT RECOMMENDATION 11/30/2019 Treatment Recommendations Therapy as outlined in treatment plan below   Prognosis 11/30/2019 Prognosis for Safe Diet  Advancement Fair Barriers to Reach Goals Severity of deficits;Time post onset Barriers/Prognosis Comment -- CHL IP DIET RECOMMENDATION 12/23/2019 SLP Diet Recommendations Dysphagia 3 (Mech soft) solids;Nectar thick liquid Liquid Administration via Cup;Spoon Medication Administration Crushed with puree Compensations Slow rate;Small sips/bites;Other (Comment) Postural Changes Seated upright at 90 degrees;Remain semi-upright after after feeds/meals (Comment)   CHL IP OTHER RECOMMENDATIONS 12/23/2019 Recommended Consults -- Oral Care Recommendations Oral care BID;Oral care before and after PO;Staff/trained caregiver to provide oral care Other Recommendations Order thickener from pharmacy;Have oral suction available   CHL IP FOLLOW UP RECOMMENDATIONS 12/23/2019 Follow up Recommendations Home health SLP;Outpatient SLP;24 hour supervision/assistance   CHL IP FREQUENCY AND DURATION 11/30/2019 Speech Therapy Frequency (ACUTE ONLY) min 2x/week Treatment Duration 2 weeks      CHL IP ORAL PHASE 12/23/2019 Oral Phase Impaired Oral - Pudding Teaspoon -- Oral - Pudding Cup -- Oral - Honey Teaspoon NT Oral - Honey Cup -- Oral - Nectar Teaspoon NT Oral - Nectar Cup Holding of bolus;Piecemeal swallowing;Decreased bolus cohesion Oral - Nectar Straw -- Oral - Thin Teaspoon NT Oral - Thin Cup Decreased bolus cohesion;Holding of bolus;Right anterior bolus loss Oral - Thin Straw -- Oral - Puree NT Oral - Mech Soft Lingual pumping;Delayed oral transit Oral - Regular -- Oral - Multi-Consistency -- Oral - Pill -- Oral Phase - Comment --  CHL IP PHARYNGEAL PHASE 12/23/2019 Pharyngeal Phase Impaired Pharyngeal- Pudding Teaspoon -- Pharyngeal -- Pharyngeal- Pudding Cup -- Pharyngeal -- Pharyngeal- Honey Teaspoon NT Pharyngeal -- Pharyngeal- Honey Cup -- Pharyngeal -- Pharyngeal- Nectar Teaspoon NT Pharyngeal -- Pharyngeal- Nectar Cup Penetration/Aspiration before swallow;Penetration/Aspiration during swallow Pharyngeal Material enters airway,  passes BELOW cords and not ejected out despite cough attempt by patient;Material enters airway, CONTACTS cords and not ejected out Pharyngeal- Nectar Straw -- Pharyngeal -- Pharyngeal-  Thin Teaspoon NT Pharyngeal -- Pharyngeal- Thin Cup Delayed swallow initiation-pyriform sinuses;Reduced epiglottic inversion;Penetration/Aspiration during swallow Pharyngeal Material enters airway, passes BELOW cords without attempt by patient to eject out (silent aspiration) Pharyngeal- Thin Straw -- Pharyngeal -- Pharyngeal- Puree NT Pharyngeal -- Pharyngeal- Mechanical Soft Delayed swallow initiation-vallecula Pharyngeal -- Pharyngeal- Regular -- Pharyngeal -- Pharyngeal- Multi-consistency -- Pharyngeal -- Pharyngeal- Pill -- Pharyngeal -- Pharyngeal Comment --  CHL IP CERVICAL ESOPHAGEAL PHASE 12/23/2019 Cervical Esophageal Phase WFL Pudding Teaspoon -- Pudding Cup -- Honey Teaspoon -- Honey Cup -- Nectar Teaspoon -- Nectar Cup -- Nectar Straw -- Thin Teaspoon -- Thin Cup -- Thin Straw -- Puree -- Mechanical Soft -- Regular -- Multi-consistency -- Pill -- Cervical Esophageal Comment -- Arbutus Leas 12/23/2019, 11:35 AM              Recent Labs    12/22/19 1103  WBC 6.3  HGB 13.7  HCT 41.4  PLT 367   Recent Labs    12/21/19 0816 12/24/19 0450  NA  --  142  K  --  3.8  CL  --  107  CO2  --  25  GLUCOSE  --  109*  BUN  --  21  CREATININE 0.80 0.71  CALCIUM  --  9.5    Physical Exam: BP 132/72   Pulse 90   Temp 97.7 F (36.5 C)   Resp 17   Ht 6' (1.829 m)   Wt 88.5 kg   SpO2 95%   BMI 26.46 kg/m   Constitutional: No distress . Vital signs reviewed. HENT: Normocephalic.  Atraumatic. Eyes: EOMI. No discharge. Cardiovascular: No JVD. Respiratory: Normal effort.  No stridor. GI: Non-distended. Skin: Warm and dry.  Intact. Psych: Flat. Musc: Lower extremity edema unchanged GU: + Foley Neuro: Alert Dysarthria, stable Motor:  Right lower extremity: 1/5 proximal distal, stable Left lower  extremity: Hip flexion, knee extension 1+/5, ankle dorsiflexion 1+/5, stable Extensor tone b/l LE R>L, stable  Assessment/Plan: 1. Functional deficits secondary to debility which require 3+ hours per day of interdisciplinary therapy in a comprehensive inpatient rehab setting.  Physiatrist is providing close team supervision and 24 hour management of active medical problems listed below.  Physiatrist and rehab team continue to assess barriers to discharge/monitor patient progress toward functional and medical goals  Care Tool:  Bathing    Body parts bathed by patient: Right arm, Left arm, Chest, Abdomen, Front perineal area, Right upper leg, Left upper leg, Face   Body parts bathed by helper: Buttocks, Right lower leg, Left lower leg     Bathing assist Assist Level: Moderate Assistance - Patient 50 - 74%     Upper Body Dressing/Undressing Upper body dressing   What is the patient wearing?: Pull over shirt    Upper body assist Assist Level: Maximal Assistance - Patient 25 - 49%    Lower Body Dressing/Undressing Lower body dressing      What is the patient wearing?: Pants, Incontinence brief     Lower body assist Assist for lower body dressing: 2 Helpers     Toileting Toileting    Toileting assist Assist for toileting: Dependent - Patient 0%     Transfers Chair/bed transfer  Transfers assist  Chair/bed transfer activity did not occur: Safety/medical concerns  Chair/bed transfer assist level: 2 Helpers     Locomotion Ambulation   Ambulation assist   Ambulation activity did not occur: Safety/medical concerns          Walk 10 feet  activity   Assist  Walk 10 feet activity did not occur: Safety/medical concerns        Walk 50 feet activity   Assist Walk 50 feet with 2 turns activity did not occur: Safety/medical concerns         Walk 150 feet activity   Assist Walk 150 feet activity did not occur: Safety/medical concerns         Walk  10 feet on uneven surface  activity   Assist Walk 10 feet on uneven surfaces activity did not occur: Safety/medical concerns         Wheelchair     Assist Will patient use wheelchair at discharge?: Yes Type of Wheelchair: Manual    Wheelchair assist level: Supervision/Verbal cueing Max wheelchair distance: 25ft    Wheelchair 50 feet with 2 turns activity    Assist        Assist Level: Supervision/Verbal cueing   Wheelchair 150 feet activity     Assist Wheelchair 150 feet activity did not occur: Safety/medical concerns          Medical Problem List and Plan: 1.  Decreased functional mobility secondary to acute respiratory distress with hypoxic respiratory failure secondary to multi focal pneumonia in the setting of MS.    Follow-up chest x-ray 12/07/2019 bibasilar pulmonary infiltrates improved from prior exams, repeat chest x-ray on 12/21 personally reviewed showing?  Persistence of infiltrate- monitor at present per Pulm.   Patient completed 10-day course of Augmentin on 12/17  Completed 5-day course of IV Solu-Medrol.    Continue CIR   Palliative care consulted, appreciate recs-DNR/DNI.  No changes in current management while in rehab 2.  Antithrombotics: DVT/anticoagulation: Subcutaneous Lovenox.  CT angiogram of chest negative for pulmonary emboli  Creatinine WNL on 12/30             Antiplatelet therapy: N/A 3. Pain Management/spasticity: Baclofen 5 mg 3 times daily 4. Mood: Provide emotional support  Appreciate neuropsych eval  Fluoxetine started on 12/18, increased on 12/26   Continues to be depressed, but appears to be coming to terms with his situation             -antipsychotic agents: N/A 5. Neuropsych: This patient is not capable of making decisions on his own behalf. 6. Skin/Wound Care: Routine skin checks/pressure injury to buttocks.  Follow-up skin care checks 7. Fluids/Electrolytes/Nutrition: Routine in and outs 8.  Atrial  fibrillation with RVR.  IV Cardizem discontinued due to hypotension.  No current plan for anticoagulation  Monitor with increased mobility. 9.  Neurogenic bowel bladder with recurrent UTIs.  Indwelling Foley catheter tube to remain in place at the recommendations of urology services per plan outpatient evaluation for possible need for suprapubic tube  UA on 12/21 suggestive of infection, urine culture showing Pseudomonas, pansensitive  Ciprofloxacin started on 12/25-1/2, Continue foley  10.  Dysphagia.  Advanced to Dysphagia # 3, nectars  Continue to advance diet as tolerated. 11.AKI: Resolved  Creatinine within normal limits on 1/1  D/ced IVF 12. Spasticity in setting of MS  Baclofen increased to home dose of 10 mg 3 times daily on 12/17, decreased back to 5 mg 3 times daily on 12/19 due to lethargy, increased back to 10 mg on 12/30.    Monitor for lethargy  Patient does not want any further aggressive management at present  13. Steroid induced hyperglycemia: ?Resolved  Cont to monitor 14.  Hypernatremia: Resolved  Sodium 142 on 1/1  Cont to monitor 15. Hypoalbuminemia  Supplement initiated on 12/16 16.  Transaminitis  LFTs elevated, but improving on 12/21, labs ordered for Monday  Continue to monitor 17. ABLA: Resolved  Hb 13.9 on 12/21  Cont to monitor 18. Constipation  Bowel meds increased on 12/27  Improving  LOS: 17 days A FACE TO FACE EVALUATION WAS PERFORMED  Ankit Karis Juba 12/24/2019, 7:49 AM

## 2019-12-25 ENCOUNTER — Inpatient Hospital Stay (HOSPITAL_COMMUNITY): Payer: Medicare Other | Admitting: Speech Pathology

## 2019-12-25 ENCOUNTER — Inpatient Hospital Stay (HOSPITAL_COMMUNITY): Payer: Medicare Other | Admitting: Physical Therapy

## 2019-12-25 ENCOUNTER — Inpatient Hospital Stay (HOSPITAL_COMMUNITY): Payer: Medicare Other | Admitting: Occupational Therapy

## 2019-12-25 NOTE — Progress Notes (Signed)
Physical Therapy Session Note  Patient Details  Name: Stephen Wade MRN: [id number] Date of Birth: 12-Apr-1951  Today's Date: 12/25/2019 PT Individual Time: 0905-0930 PT Individual Time Calculation (min): 25 min   Short Term Goals: Week 3:  PT Short Term Goal 1 (Week 3): Pt will perform bed mobility with mod assist PT Short Term Goal 2 (Week 3): [facility] with mod assist PT Short Term Goal 3 (Week 3): Pt will perform sit<>stand with mod assist and BUE support on rail or in stedy PT Short Term Goal 4 (Week 3): Pt will propel WC >139f with supevision assist  Skilled Therapeutic Interventions/Progress Updates:   Pt received supine in bed and agreeable to PT. RN requesting to assess skin break down on glueal surface. Rolling R and L with min assist and BUE support on rails to assess skin and then place maxi move pad. [facility]  Use of Nustep. SB transfer to mat table with mod assist initialluy, then fading to max assist + 2 for safety due to poor truynk control and UE placement. sitting balance EOB x 5 minutes with mod assist throughout. Pt actively attempting to llie down, despite repeated encouragement form PT remain sitting EOB. [facility] overall to the R. [facility] returned to [facility] and [facility] and all needs met.          Therapy Documentation Precautions:  Precautions Precautions: Fall Precaution Comments: hx MS (R LE deficits > L LE, spastic diplegia) Restrictions Weight Bearing Restrictions: No Pain:   denies   Therapy/Group: Individual Therapy  ALorie Phenix1/01/2020, 11:36 AM

## 2019-12-25 NOTE — Plan of Care (Signed)
  Problem: RH BOWEL ELIMINATION Goal: RH STG MANAGE BOWEL WITH ASSISTANCE Description: STG Manage Bowel with Min Assistance. Outcome: Not Progressing; LBM 12/29; poor appetite; laxatives given Problem: RH BLADDER ELIMINATION Goal: RH STG MANAGE BLADDER WITH ASSISTANCE Description: STG Manage Bladder With max Assistance Outcome: Not Progressing; foley

## 2019-12-25 NOTE — Progress Notes (Addendum)
Occupational Therapy Session Note  Patient Details  Name: Stephen Wade MRN: [id number] Date of Birth: 04-07-1951  Today's Date: 12/25/2019 OT Individual Time: 1300-1400 OT Individual Time Calculation (min): 60 min    Short Term Goals: Week 3:  OT Short Term Goal 1 (Week 3): Pt will complete LB dressing with max A OT Short Term Goal 2 (Week 3): Pt will complete transfer bed > roll in shower chair/wheelchair with max assist of one caregiver OT Short Term Goal 3 (Week 3): Pt will complete UB dressing with min A OT Short Term Goal 4 (Week 3): Pt will maintain dynamic sitting balance with Mod assist during self-care tasks  Skilled Therapeutic Interventions/Progress Updates:    Upon entering the room, pt supine in bed with HOB elevated finishing lunch. Pt requesting ice water. Pt performed oral care with suction toothbrush and pt consuming 10 sips of water with 2 coughs noted. Min cuing needed for chin tuck with swallow. Pt refusing OOB intervention this session secondary to fatigue and having just returned to bed prior to OT arrival. OT placed B LE PRAFO's on pt and assisted with positioning for comfort. Pt engaged in grooming task of washing and shaving face with set up A to obtain all needed materials. Pt requiring increased time to complete task. Pt remained in wheelchair with call bell and all needed items within reach upon exiting the room.   Therapy Documentation Precautions:  Precautions Precautions: Fall Precaution Comments: hx MS (R LE deficits > L LE, spastic diplegia) Restrictions Weight Bearing Restrictions: No General:   Vital Signs: Therapy Vitals Temp: 97.9 F (36.6 C) Temp Source: Oral Pulse Rate: 78 BP: 121/69 Oxygen Therapy SpO2: 98 % ADL: ADL Eating: Minimal assistance Where Assessed-Eating: Bed level Grooming: Minimal assistance Where Assessed-Grooming: Bed level Upper Body Bathing: Minimal assistance Where Assessed-Upper Body Bathing: Edge of bed Lower Body  Bathing: Maximal assistance Where Assessed-Lower Body Bathing: Bed level Upper Body Dressing: Moderate assistance Where Assessed-Upper Body Dressing: Edge of bed Lower Body Dressing: Dependent Where Assessed-Lower Body Dressing: Bed level Toileting: Dependent Where Assessed-Toileting: Bed level Toilet Transfer: Unable to assess Toilet Transfer Method: Unable to assess Tub/Shower Transfer: Unable to assess   Therapy/Group: Individual Therapy  Alen Bleacher 12/25/2019, 3:50 PM

## 2019-12-25 NOTE — Progress Notes (Signed)
[place] PHYSICAL MEDICINE & REHABILITATION PROGRESS NOTE  Subjective/Complaints:   Pt reports no issues- up in W/C heading to gym with PT/OT; says feels pretty well.  ROS: Denies CP, SOB, N/V/D  Objective: Vital Signs: Blood pressure 113/78, pulse 87, temperature 98.4 F (36.9 C), temperature source Oral, resp. rate 18, height 6' (1.829 m), weight 88.5 kg, SpO2 94 %. No results found. No results for input(s): WBC, HGB, HCT, PLT in the last 72 hours. Recent Labs    12/24/19 0450  NA 142  K 3.8  CL 107  CO2 25  GLUCOSE 109*  BUN 21  CREATININE 0.71  CALCIUM 9.5    Physical Exam: BP 113/78 (BP Location: Right Arm)   Pulse 87   Temp 98.4 F (36.9 C) (Oral)   Resp 18   Ht 6' (1.829 m)   Wt 88.5 kg   SpO2 94%   BMI 26.46 kg/m   Constitutional: No distress . Vital signs reviewed. HENT: Normocephalic.  Atraumatic. Eyes: EOMI. No discharge. Cardiovascular: No JVD. Respiratory: Normal effort.  No stridor. GI: Non-distended. Skin: Warm and dry.  Intact. Psych: very Flat. Musc: Lower extremity edema unchanged GU: + Foley Neuro: Alert Dysarthria, stable Motor:  Right lower extremity: 1/5 proximal distal, stable Left lower extremity: Hip flexion, knee extension 1+/5, ankle dorsiflexion 1+/5, stable Extensor tone b/l LE R>L, stable  Assessment/Plan: 1. Functional deficits secondary to debility which require 3+ hours per day of interdisciplinary therapy in a comprehensive inpatient rehab setting.  Physiatrist is providing close team supervision and 24 hour management of active medical problems listed below.  Physiatrist and rehab team continue to assess barriers to discharge/monitor patient progress toward functional and medical goals  Care Tool:  Bathing    Body parts bathed by patient: Right arm, Left arm, Chest, Abdomen, Front perineal area, Right upper leg, Left upper leg, Face   Body parts bathed by helper: Buttocks, Right lower leg, Left lower leg      Bathing assist Assist Level: Moderate Assistance - Patient 50 - 74%     Upper Body Dressing/Undressing Upper body dressing   What is the patient wearing?: Pull over shirt    Upper body assist Assist Level: Moderate Assistance - Patient 50 - 74%    Lower Body Dressing/Undressing Lower body dressing      What is the patient wearing?: Pants, Incontinence brief     Lower body assist Assist for lower body dressing: 2 Helpers     Toileting Toileting    Toileting assist Assist for toileting: Dependent - Patient 0%     Transfers Chair/bed transfer  Transfers assist  Chair/bed transfer activity did not occur: Safety/medical concerns  Chair/bed transfer assist level: 2 Helpers     Locomotion Ambulation   Ambulation assist   Ambulation activity did not occur: Safety/medical concerns          Walk 10 feet activity   Assist  Walk 10 feet activity did not occur: Safety/medical concerns        Walk 50 feet activity   Assist Walk 50 feet with 2 turns activity did not occur: Safety/medical concerns         Walk 150 feet activity   Assist Walk 150 feet activity did not occur: Safety/medical concerns         Walk 10 feet on uneven surface  activity   Assist Walk 10 feet on uneven surfaces activity did not occur: Safety/medical concerns  Wheelchair     Assist Will patient use wheelchair at discharge?: Yes Type of Wheelchair: Manual    Wheelchair assist level: Supervision/Verbal cueing Max wheelchair distance: 51ft    Wheelchair 50 feet with 2 turns activity    Assist        Assist Level: Supervision/Verbal cueing   Wheelchair 150 feet activity     Assist Wheelchair 150 feet activity did not occur: Safety/medical concerns          Medical Problem List and Plan: 1.  Decreased functional mobility secondary to acute respiratory distress with hypoxic respiratory failure secondary to multi focal pneumonia in the  setting of MS.    Follow-up chest x-ray 12/07/2019 bibasilar pulmonary infiltrates improved from prior exams, repeat chest x-ray on 12/21 personally reviewed showing?  Persistence of infiltrate- monitor at present per Pulm.   Patient completed 10-day course of Augmentin on 12/17  Completed 5-day course of IV Solu-Medrol.    Continue CIR   Palliative care consulted, appreciate recs-DNR/DNI.  No changes in current management while in rehab 2.  Antithrombotics: DVT/anticoagulation: Subcutaneous Lovenox.  CT angiogram of chest negative for pulmonary emboli  Creatinine WNL on 12/30             Antiplatelet therapy: N/A 3. Pain Management/spasticity: Baclofen 5 mg 3 times daily 4. Mood: Provide emotional support  Appreciate neuropsych eval  Fluoxetine started on 12/18, increased on 12/26   Continues to be depressed, but appears to be coming to terms with his situation             -antipsychotic agents: N/A 5. Neuropsych: This patient is not capable of making decisions on his own behalf. 6. Skin/Wound Care: Routine skin checks/pressure injury to buttocks.  Follow-up skin care checks 7. Fluids/Electrolytes/Nutrition: Routine in and outs 8.  Atrial fibrillation with RVR.  IV Cardizem discontinued due to hypotension.  No current plan for anticoagulation  Monitor with increased mobility. 9.  Neurogenic bowel bladder with recurrent UTIs.  Indwelling Foley catheter tube to remain in place at the recommendations of urology services per plan outpatient evaluation for possible need for suprapubic tube  UA on 12/21 suggestive of infection, urine culture showing Pseudomonas, pansensitive  Ciprofloxacin started on 12/25-1/2, Continue foley  10.  Dysphagia.  Advanced to Dysphagia # 3, nectars  Continue to advance diet as tolerated. 11.AKI: Resolved  Creatinine within normal limits on 1/1  D/ced IVF  BUN 21- on edge of elevated- will monitor- labs Monday AM 12. Spasticity in setting of MS  Baclofen  increased to home dose of 10 mg 3 times daily on 12/17, decreased back to 5 mg 3 times daily on 12/19 due to lethargy, increased back to 10 mg on 12/30.    Monitor for lethargy  Patient does not want any further aggressive management at present  13. Steroid induced hyperglycemia: ?Resolved  Cont to monitor 14.  Hypernatremia: Resolved  Sodium 142 on 1/1  Cont to monitor 15. Hypoalbuminemia  Supplement initiated on 12/16 16.  Transaminitis  LFTs elevated, but improving on 12/21, labs ordered for Monday  Continue to monitor 17. ABLA: Resolved  Hb 13.9 on 12/21  Cont to monitor 18. Constipation  Bowel meds increased on 12/27  Improving  LOS: 18 days A FACE TO FACE EVALUATION WAS PERFORMED  Siona Coulston 12/25/2019, 11:10 AM

## 2019-12-25 NOTE — Progress Notes (Signed)
Speech Language Pathology Daily Session Note  Patient Details  Name: Stephen Wade MRN: [id number] Date of Birth: 1951-11-18  Today's Date: 12/25/2019 SLP Individual Time: 1015-1055 SLP Individual Time Calculation (min): 40 min  Short Term Goals: Week 3: SLP Short Term Goal 1 (Week 3): Pt will consume least restrictive diet with minimal overt s/sx aspiration, efficient mastication and oral clearance, and Supervision A for use of swallow strategies. SLP Short Term Goal 2 (Week 3): Pt will demonstrate efficient mastication and oral clearnace of Dys 3 (mech soft) solids X2 prior to upgrade. SLP Short Term Goal 3 (Week 3): Pt will participate in instrumental MBSS to reassess oropharyngeal swallow function and assess readiness for liquid advancement. SLP Short Term Goal 4 (Week 3): Pt will demonstrate recall of new/day to day information with Min A verbal/visual cues for use of compensatory strategies and/or external aids. SLP Short Term Goal 5 (Week 3): Pt will demonstrate ability to problem solve during functional mildly complex tasks with Min A verbal/visual cues. SLP Short Term Goal 6 (Week 3): Pt will use speech intelligibility strategies with Supervision A verbal cues to achieve 80% intelligibility at the sentence level.  Skilled Therapeutic Interventions: Skilled treatment session focused on dysphagia and cognitive goals. Upon arrival, patient was upright in the wheelchair. Patient performed 25 repetitions of his EMST device with overall supervision and a self-perceived effort level of 7/10. SLP facilitated session by providing set-up assist with oral care via the suction toothbrush. Patient consumed trials of thin liquids via tsp with use of a chin tuck per the water protocol with overt s/s of aspiration in 25% of trials. SLP also facilitated session by providing Min A verbal cues for recall of events from previous therapy sessions as well as recall of goals of each discipline. Patient became  restless in the chair and requested to get back to bed at the end of session. Patient was transferred via the [facility] with +2 assist for safety. Patient left upright in bed with alarm on and all needs within reach. Continue with current plan of care.      Pain No/Denies Pain   Therapy/Group: Individual Therapy  Niccole Witthuhn 12/25/2019, 2:03 PM

## 2019-12-25 NOTE — Progress Notes (Signed)
Patient noted laying in bed, alert & responsive. He refused a few medications including protonix, prostat & eye drops. He requested ice water & was reminded that he had to brush his teeth to be able to follow the water protocol. He also asked for water with his oral meds & his ensure. Patient is on a dysphagia 3 diet & nectar thick liquids. He refused thickener to be added to his ensure, so it was not given. Reasoning was explained & he was educated on aspiration risk. No c/o pain or discomfort. Will continue to monitor for changes.

## 2019-12-26 ENCOUNTER — Inpatient Hospital Stay (HOSPITAL_COMMUNITY): Payer: Medicare Other | Admitting: Occupational Therapy

## 2019-12-26 ENCOUNTER — Inpatient Hospital Stay (HOSPITAL_COMMUNITY): Payer: Medicare Other

## 2019-12-26 NOTE — Progress Notes (Signed)
Pt refused all oral medications. Agrees to lovenox only after discussion. Sleepy, depressed appearance. Refusing prn dig stim at this time.

## 2019-12-26 NOTE — Progress Notes (Addendum)
Physical Therapy Session Note  Patient Details  Name: Stephen Wade MRN: [id number] Date of Birth: 09/29/1951  Today's Date: 12/26/2019 PT Individual Time: 0815-0825 PT Individual Time Calculation (min): 10 min   Short Term Goals: Week 3:  PT Short Term Goal 1 (Week 3): Pt will perform bed mobility with mod assist PT Short Term Goal 2 (Week 3): Pt will transfer to [facility] with mod assist PT Short Term Goal 3 (Week 3): Pt will perform sit<>stand with mod assist and BUE support on rail or in stedy PT Short Term Goal 4 (Week 3): Pt will propel WC >139ft with supevision assist  Skilled Therapeutic Interventions/Progress Updates:     Patient in bed with NT assisting while patient performed bed level toileting upon PT arrival. Patient missed 15 min of skilled PT due to toileting. Patient in bed with NT and RN in room applying new foam sacrad dressing. Patient alert and agreeable to PT session. PT doffed B PRAFOs and donned non-skid socks with total A. Patient performed rolling in bed R/L with min A with use of bed rails to don pants with max-total A and scooting up in bed x2 with manual facilitation for bending B LEs to push up in the bed with max A for scooting. Began LE knee and hip flexion PROM and patient requested that PT stop and reported increased fatigue from having a BM this morning. Requested to rest and end PT session. Patient missed 35 min of skilled PT due to patient fatigue, RN made aware. Will attempt to make up missed time as able.  Patient in bed at end of session with breaks locked, bed alarm set, and all needs within reach. Provided patient with foam/Roho cushion for TIS w/c during session for improved pressure relief in sitting.    Therapy Documentation Precautions:  Precautions Precautions: Fall Precaution Comments: hx MS (R LE deficits > L LE, spastic diplegia) Restrictions Weight Bearing Restrictions: No General: PT Amount of Missed Time (min): 50 Minutes PT Missed Treatment  Reason: Toileting;Patient fatigue    Therapy/Group: Individual Therapy  Dara Camargo L Damiya Sandefur PT, DPT  12/26/2019, 8:31 AM

## 2019-12-26 NOTE — Progress Notes (Signed)
Patient continues to refuse his medications. Wife in room and aware.

## 2019-12-26 NOTE — Progress Notes (Signed)
Patient had a bowel movement after digital stimulation and manual disimpaction today. Patient refused medications this morning claims he is giving up. Patient encouraged and support given.

## 2019-12-26 NOTE — Progress Notes (Signed)
Occupational Therapy Session Note  Patient Details  Name: Stephen Wade MRN: [id number] Date of Birth: Jan 25, 1951  Today's Date: 12/26/2019 OT Individual Time: 1000-1026 OT Individual Time Calculation (min): 26 min  and Today's Date: 12/26/2019 OT Missed Time: 30 Minutes Missed Time Reason: Patient unwilling/refused to participate without medical reason;Patient fatigue   Short Term Goals: Week 3:  OT Short Term Goal 1 (Week 3): Pt will complete LB dressing with max A OT Short Term Goal 2 (Week 3): Pt will complete transfer bed > roll in shower chair/wheelchair with max assist of one caregiver OT Short Term Goal 3 (Week 3): Pt will complete UB dressing with min A OT Short Term Goal 4 (Week 3): Pt will maintain dynamic sitting balance with Mod assist during self-care tasks  Skilled Therapeutic Interventions/Progress Updates:    Upon entering the room, pt supine and sleeping soundly in bed. OT arousing pt for participation and he reports, " No! No! Please just leave me alone". OT encouraged pt for participation and he was agreeable to oral care with set up A for water protocol. Pt having increased difficulty holding cup this session and required assistance. He continues to have flat affect but appears to be very fatigued. Pt reports politely, " I have just had enough of all of this. It is time for me to go home with my wife. I just can't keep doing this." Pt verbalized wishing to talk to wife privately about his wishes involving his care and would like to return home with caregivers to assist him as needed. RN is aware of pt's statement as he verbalized request to her as well. Pt declines further OT intervention, self care, and transfer into wheelchair. 30 missed minutes of skilled OT intervention.   Therapy Documentation Precautions:  Precautions Precautions: Fall Precaution Comments: hx MS (R LE deficits > L LE, spastic diplegia) Restrictions Weight Bearing Restrictions:  No General: General OT Amount of Missed Time: 30 Minutes PT Missed Treatment Reason: Toileting;Patient fatigue   ADL: ADL Eating: Minimal assistance Where Assessed-Eating: Bed level Grooming: Minimal assistance Where Assessed-Grooming: Bed level Upper Body Bathing: Minimal assistance Where Assessed-Upper Body Bathing: Edge of bed Lower Body Bathing: Maximal assistance Where Assessed-Lower Body Bathing: Bed level Upper Body Dressing: Moderate assistance Where Assessed-Upper Body Dressing: Edge of bed Lower Body Dressing: Dependent Where Assessed-Lower Body Dressing: Bed level Toileting: Dependent Where Assessed-Toileting: Bed level Toilet Transfer: Unable to assess Toilet Transfer Method: Unable to assess Tub/Shower Transfer: Unable to assess   Therapy/Group: Individual Therapy  Alen Bleacher 12/26/2019, 10:41 AM

## 2019-12-26 NOTE — Progress Notes (Signed)
[name] PHYSICAL MEDICINE & REHABILITATION PROGRESS NOTE  Subjective/Complaints:   Pt reports no issues- per nurse, needed some dig stim/manual disimpaction- stool in rectum but couldn't get it out-also needs order for foam dressing for blister coverage- was orders. ROS: Denies CP, SOB, N/V/D  Objective: Vital Signs: Blood pressure 121/87, pulse 82, temperature 98 F (36.7 C), temperature source Oral, resp. rate 18, height 6' (1.829 m), weight 88.5 kg, SpO2 94 %. No results found. No results for input(s): WBC, HGB, HCT, PLT in the last 72 hours. Recent Labs    12/24/19 0450  NA 142  K 3.8  CL 107  CO2 25  GLUCOSE 109*  BUN 21  CREATININE 0.71  CALCIUM 9.5    Physical Exam: BP 121/87 (BP Location: Right Arm)   Pulse 82   Temp 98 F (36.7 C) (Oral)   Resp 18   Ht 6' (1.829 m)   Wt 88.5 kg   SpO2 94%   BMI 26.46 kg/m   Constitutional: No distress . Vital signs reviewed. Lying in bed; very flat affect HENT: Normocephalic.  Atraumatic. Eyes: EOMI. No discharge. Cardiovascular: No JVD. Respiratory: Normal effort.  No stridor. GI: Non-distended. Skin: Warm and dry.  Intact. Psych: very Flat. Musc: Lower extremity edema unchanged GU: + Foley Neuro: Alert Dysarthria, stable Motor:  Right lower extremity: 1/5 proximal distal, stable Left lower extremity: Hip flexion, knee extension 1+/5, ankle dorsiflexion 1+/5, stable Extensor tone b/l LE R>L, stable  Assessment/Plan: 1. Functional deficits secondary to debility which require 3+ hours per day of interdisciplinary therapy in a comprehensive inpatient rehab setting.  Physiatrist is providing close team supervision and 24 hour management of active medical problems listed below.  Physiatrist and rehab team continue to assess barriers to discharge/monitor patient progress toward functional and medical goals  Care Tool:  Bathing    Body parts bathed by patient: Right arm, Left arm, Chest, Abdomen, Front perineal  area, Right upper leg, Left upper leg, Face   Body parts bathed by helper: Buttocks, Right lower leg, Left lower leg     Bathing assist Assist Level: Moderate Assistance - Patient 50 - 74%     Upper Body Dressing/Undressing Upper body dressing   What is the patient wearing?: Pull over shirt    Upper body assist Assist Level: Moderate Assistance - Patient 50 - 74%    Lower Body Dressing/Undressing Lower body dressing      What is the patient wearing?: Pants, Incontinence brief     Lower body assist Assist for lower body dressing: 2 Helpers     Toileting Toileting    Toileting assist Assist for toileting: Dependent - Patient 0%     Transfers Chair/bed transfer  Transfers assist  Chair/bed transfer activity did not occur: Safety/medical concerns  Chair/bed transfer assist level: 2 Helpers     Locomotion Ambulation   Ambulation assist   Ambulation activity did not occur: Safety/medical concerns          Walk 10 feet activity   Assist  Walk 10 feet activity did not occur: Safety/medical concerns        Walk 50 feet activity   Assist Walk 50 feet with 2 turns activity did not occur: Safety/medical concerns         Walk 150 feet activity   Assist Walk 150 feet activity did not occur: Safety/medical concerns         Walk 10 feet on uneven surface  activity   Assist Walk 10  feet on uneven surfaces activity did not occur: Safety/medical concerns         Wheelchair     Assist Will patient use wheelchair at discharge?: Yes Type of Wheelchair: Manual    Wheelchair assist level: Supervision/Verbal cueing Max wheelchair distance: 80ft    Wheelchair 50 feet with 2 turns activity    Assist        Assist Level: Supervision/Verbal cueing   Wheelchair 150 feet activity     Assist Wheelchair 150 feet activity did not occur: Safety/medical concerns          Medical Problem List and Plan: 1.  Decreased functional  mobility secondary to acute respiratory distress with hypoxic respiratory failure secondary to multi focal pneumonia in the setting of MS.    Follow-up chest x-ray 12/07/2019 bibasilar pulmonary infiltrates improved from prior exams, repeat chest x-ray on 12/21 personally reviewed showing?  Persistence of infiltrate- monitor at present per Pulm.   Patient completed 10-day course of Augmentin on 12/17  Completed 5-day course of IV Solu-Medrol.    Continue CIR   Palliative care consulted, appreciate recs-DNR/DNI.  No changes in current management while in rehab 2.  Antithrombotics: DVT/anticoagulation: Subcutaneous Lovenox.  CT angiogram of chest negative for pulmonary emboli  Creatinine WNL on 12/30             Antiplatelet therapy: N/A 3. Pain Management/spasticity: Baclofen 5 mg 3 times daily 4. Mood: Provide emotional support  Appreciate neuropsych eval  Fluoxetine started on 12/18, increased on 12/26   Continues to be depressed, but appears to be coming to terms with his situation             -antipsychotic agents: N/A 5. Neuropsych: This patient is not capable of making decisions on his own behalf. 6. Skin/Wound Care: Routine skin checks/pressure injury to buttocks.  Follow-up skin care checks 7. Fluids/Electrolytes/Nutrition: Routine in and outs 8.  Atrial fibrillation with RVR.  IV Cardizem discontinued due to hypotension.  No current plan for anticoagulation  Monitor with increased mobility. 9.  Neurogenic bowel bladder with recurrent UTIs.  Indwelling Foley catheter tube to remain in place at the recommendations of urology services per plan outpatient evaluation for possible need for suprapubic tube  UA on 12/21 suggestive of infection, urine culture showing Pseudomonas, pansensitive  Ciprofloxacin started on 12/25-1/2, Continue foley  10.  Dysphagia.  Advanced to Dysphagia # 3, nectars  Continue to advance diet as tolerated. 11.AKI: Resolved  Creatinine within normal limits on  1/1  D/ced IVF  BUN 21- on edge of elevated- will monitor- labs Monday AM 12. Spasticity in setting of MS  Baclofen increased to home dose of 10 mg 3 times daily on 12/17, decreased back to 5 mg 3 times daily on 12/19 due to lethargy, increased back to 10 mg on 12/30.    Monitor for lethargy  Patient does not want any further aggressive management at present  13. Steroid induced hyperglycemia: ?Resolved  Cont to monitor 14.  Hypernatremia: Resolved  Sodium 142 on 1/1  Cont to monitor 15. Hypoalbuminemia  Supplement initiated on 12/16 16.  Transaminitis  LFTs elevated, but improving on 12/21, labs ordered for Monday  Continue to monitor 17. ABLA: Resolved  Hb 13.9 on 12/21  Cont to monitor 18. Constipation  Bowel meds increased on 12/27  Improving  -ordered dig stim PRN if needed for stool that pt can't push out of rectum  LOS: 19 days A FACE TO FACE EVALUATION WAS PERFORMED  Stephen Wade 12/26/2019, 10:40 AM

## 2019-12-26 NOTE — Plan of Care (Signed)
  Problem: RH BOWEL ELIMINATION Goal: RH STG MANAGE BOWEL WITH ASSISTANCE Description: STG Manage Bowel with Min Assistance. Outcome: Progressing; LBM 1/3 Problem: RH BLADDER ELIMINATION Goal: RH STG MANAGE BLADDER WITH ASSISTANCE Description: STG Manage Bladder With max Assistance Outcome: Not Progressing; continuous foley Problem: RH SAFETY Goal: RH STG ADHERE TO SAFETY PRECAUTIONS W/ASSISTANCE/DEVICE Description: STG Adhere to Safety Precautions With Min Assistance/Device. Outcome: Progressing   Problem: RH SAFETY Goal: RH STG ADHERE TO SAFETY PRECAUTIONS W/ASSISTANCE/DEVICE Description: STG Adhere to Safety Precautions With Min Assistance/Device. Outcome: Progressing   Problem: RH SKIN INTEGRITY Goal: RH STG SKIN FREE OF INFECTION/BREAKDOWN Description: Min assist Outcome: Not Progressing; noted blisters bottom right and left with MASD; cleansed and foam dressing placed.

## 2019-12-26 NOTE — Progress Notes (Signed)
Physical Therapy Session Note  Patient Details  Name: Stephen Wade MRN: [id number] Date of Birth: 1951/05/19  Today's Date: 12/26/2019 PT Individual Time: 1445-1520 PT Individual Time Calculation (min): 35 min   Short Term Goals: Week 3:  PT Short Term Goal 1 (Week 3): Pt will perform bed mobility with mod assist PT Short Term Goal 2 (Week 3): Pt will transfer to [facility] with mod assist PT Short Term Goal 3 (Week 3): Pt will perform sit<>stand with mod assist and BUE support on rail or in stedy PT Short Term Goal 4 (Week 3): Pt will propel WC >149ft with supevision assist  Skilled Therapeutic Interventions/Progress Updates:     Patient in bed with his wife at bedside upon PT arrival to make up missed time from this morning. Patient alert and initially refused working with PT due to increased fatigue today, however patient was agreeable to LE exercise/stretching in the bed with encouragement. Discussed patient current level of function with him and his wife and that he requires +2 assist for transfers and that a Hoyer lift may be a good option for transfers at home due to patient's current level of function. Patient and his wife open to trying the lift this week. Provided education on benefits of energy conservation, reduced caregiver burden, and decreased fall risk with use of a Hoyer lift at home. Also discussed patient using hospital bed versus a recliner to sleep in at home for improved pressure relief and positioning at night, patient receptive, however, may need continued reinforcement for this education throughout stay. Patient and his wife receptive to all education.   Therapeutic Exercise: Patient performed the following exercises with verbal and tactile cues for proper technique. -B DF stretch 2x30 sec -B AAROM knee and hip flexion/extension 2x5 -B AAROM hip abduction/adduction  -R knee extension stretch with towel roll under ankle 2x30 sec  Patient in bed with his wife at bedside at end  of session with breaks locked, bed alarm set, and all needs within reach. Placed B PRAFOs on educated on benefit for positioning in bed, especially since he declined getting out of the bed today. Educated patient's wife on proper donning technique and positioning.    Therapy Documentation Precautions:  Precautions Precautions: Fall Precaution Comments: hx MS (R LE deficits > L LE, spastic diplegia) Restrictions Weight Bearing Restrictions: No    Therapy/Group: Individual Therapy  Kris No L Somya Jauregui PT, DPT  12/26/2019, 3:30 PM

## 2019-12-27 ENCOUNTER — Inpatient Hospital Stay (HOSPITAL_COMMUNITY): Payer: Medicare Other

## 2019-12-27 ENCOUNTER — Inpatient Hospital Stay (HOSPITAL_COMMUNITY): Payer: Medicare Other | Admitting: Speech Pathology

## 2019-12-27 ENCOUNTER — Inpatient Hospital Stay (HOSPITAL_COMMUNITY): Payer: Medicare Other | Admitting: Occupational Therapy

## 2019-12-27 DIAGNOSIS — K592 Neurogenic bowel, not elsewhere classified: Secondary | ICD-10-CM

## 2019-12-27 LAB — CBC WITH DIFFERENTIAL/PLATELET
Abs Immature Granulocytes: 0.06 10*3/uL (ref 0.00–0.07)
Basophils Absolute: 0.1 10*3/uL (ref 0.0–0.1)
Basophils Relative: 1 %
Eosinophils Absolute: 0.3 10*3/uL (ref 0.0–0.5)
Eosinophils Relative: 5 %
HCT: 44.2 % (ref 39.0–52.0)
Hemoglobin: 14.3 g/dL (ref 13.0–17.0)
Immature Granulocytes: 1 %
Lymphocytes Relative: 19 %
Lymphs Abs: 1 10*3/uL (ref 0.7–4.0)
MCH: 30 pg (ref 26.0–34.0)
MCHC: 32.4 g/dL (ref 30.0–36.0)
MCV: 92.9 fL (ref 80.0–100.0)
Monocytes Absolute: 0.7 10*3/uL (ref 0.1–1.0)
Monocytes Relative: 13 %
Neutro Abs: 3.3 10*3/uL (ref 1.7–7.7)
Neutrophils Relative %: 61 %
Platelets: 331 10*3/uL (ref 150–400)
RBC: 4.76 MIL/uL (ref 4.22–5.81)
RDW: 14.4 % (ref 11.5–15.5)
WBC: 5.5 10*3/uL (ref 4.0–10.5)
nRBC: 0 % (ref 0.0–0.2)

## 2019-12-27 LAB — COMPREHENSIVE METABOLIC PANEL
ALT: 54 U/L — ABNORMAL HIGH (ref 0–44)
AST: 38 U/L (ref 15–41)
Albumin: 3.5 g/dL (ref 3.5–5.0)
Alkaline Phosphatase: 137 U/L — ABNORMAL HIGH (ref 38–126)
Anion gap: 10 (ref 5–15)
BUN: 22 mg/dL (ref 8–23)
CO2: 23 mmol/L (ref 22–32)
Calcium: 9.6 mg/dL (ref 8.9–10.3)
Chloride: 108 mmol/L (ref 98–111)
Creatinine, Ser: 0.87 mg/dL (ref 0.61–1.24)
GFR calc Af Amer: >60 mL/min (ref >60)
GFR calc non Af Amer: >60 mL/min (ref >60)
Glucose, Bld: 95 mg/dL (ref 70–99)
Potassium: 4.1 mmol/L (ref 3.5–5.1)
Sodium: 141 mmol/L (ref 135–145)
Total Bilirubin: 1.1 mg/dL (ref 0.3–1.2)
Total Protein: 6.7 g/dL (ref 6.5–8.1)

## 2019-12-27 NOTE — Progress Notes (Signed)
SLP Cancellation Note  Patient Details Name: Stephen Wade MRN: [id number] DOB: December 05, 1951   Cancelled treatment:         Pt stated "I am done" and refused therapy. No reason provided, pt's wife present.   Pt missed 30 minutes of skilled ST.                                                                                           Rebekah Sprinkle 12/27/2019, 3:01 PM

## 2019-12-27 NOTE — Progress Notes (Addendum)
[facility] PHYSICAL MEDICINE & REHABILITATION PROGRESS NOTE  Subjective/Complaints: Patient seen laying in bed this morning.  He states he slept well overnight.  He states "I am done".  He states he wants to go home.  Will follow up with social work and family.  ROS: Denies CP, SOB, N/V/D  Objective: Vital Signs: Blood pressure 118/87, pulse 91, temperature 97.9 F (36.6 C), temperature source Oral, resp. rate 18, height 6' (1.829 m), weight 88.5 kg, SpO2 94 %. No results found. Recent Labs    12/27/19 0638  WBC 5.5  HGB 14.3  HCT 44.2  PLT 331   No results for input(s): NA, K, CL, CO2, GLUCOSE, BUN, CREATININE, CALCIUM in the last 72 hours.  Physical Exam: BP 118/87 (BP Location: Right Arm)   Pulse 91   Temp 97.9 F (36.6 C) (Oral)   Resp 18   Ht 6' (1.829 m)   Wt 88.5 kg   SpO2 94%   BMI 26.46 kg/m   Constitutional: No distress . Vital signs reviewed. HENT: Normocephalic.  Atraumatic. Eyes: EOMI. No discharge. Cardiovascular: No JVD. Respiratory: Normal effort.  No stridor. GI: Non-distended. Skin: Warm and dry.  Intact. Psych: Flat. Musc: Lower extremity edema GU: + Foley Neuro: Alert Dysarthria, unchanged Motor:  Right lower extremity: 1/5 proximal distal, stable Left lower extremity: Hip flexion, knee extension 1+/5, ankle dorsiflexion 1+/5, stable Extensor tone b/l LE R>L, stable  Assessment/Plan: 1. Functional deficits secondary to debility which require 3+ hours per day of interdisciplinary therapy in a comprehensive inpatient rehab setting.  Physiatrist is providing close team supervision and 24 hour management of active medical problems listed below.  Physiatrist and rehab team continue to assess barriers to discharge/monitor patient progress toward functional and medical goals  Care Tool:  Bathing    Body parts bathed by patient: Right arm, Left arm, Chest, Abdomen, Front perineal area, Right upper leg, Left upper leg, Face   Body parts bathed  by helper: Buttocks, Right lower leg, Left lower leg     Bathing assist Assist Level: Moderate Assistance - Patient 50 - 74%     Upper Body Dressing/Undressing Upper body dressing   What is the patient wearing?: Pull over shirt    Upper body assist Assist Level: Moderate Assistance - Patient 50 - 74%    Lower Body Dressing/Undressing Lower body dressing      What is the patient wearing?: Pants, Incontinence brief     Lower body assist Assist for lower body dressing: 2 Helpers     Toileting Toileting    Toileting assist Assist for toileting: Dependent - Patient 0%     Transfers Chair/bed transfer  Transfers assist  Chair/bed transfer activity did not occur: Safety/medical concerns  Chair/bed transfer assist level: 2 Helpers     Locomotion Ambulation   Ambulation assist   Ambulation activity did not occur: Safety/medical concerns          Walk 10 feet activity   Assist  Walk 10 feet activity did not occur: Safety/medical concerns        Walk 50 feet activity   Assist Walk 50 feet with 2 turns activity did not occur: Safety/medical concerns         Walk 150 feet activity   Assist Walk 150 feet activity did not occur: Safety/medical concerns         Walk 10 feet on uneven surface  activity   Assist Walk 10 feet on uneven surfaces activity did not occur: Safety/medical  concerns         Wheelchair     Assist Will patient use wheelchair at discharge?: Yes Type of Wheelchair: Manual    Wheelchair assist level: Supervision/Verbal cueing Max wheelchair distance: 22ft    Wheelchair 50 feet with 2 turns activity    Assist        Assist Level: Supervision/Verbal cueing   Wheelchair 150 feet activity     Assist Wheelchair 150 feet activity did not occur: Safety/medical concerns          Medical Problem List and Plan: 1.  Decreased functional mobility secondary to acute respiratory distress with hypoxic  respiratory failure secondary to multi focal pneumonia in the setting of MS.    Follow-up chest x-ray 12/07/2019 bibasilar pulmonary infiltrates improved from prior exams, repeat chest x-ray on 12/21 personally reviewed showing?  Persistence of infiltrate- monitor at present per Pulm.   Patient completed 10-day course of Augmentin on 12/17  Completed 5-day course of IV Solu-Medrol.    Continue CIR   Palliative care consulted, appreciate recs-DNR/DNI.  No changes in current management while in rehab 2.  Antithrombotics: DVT/anticoagulation: Subcutaneous Lovenox.  CT angiogram of chest negative for pulmonary emboli  CBC within normal limits on 1/4             Antiplatelet therapy: N/A 3. Pain Management/spasticity: Baclofen 5 mg 3 times daily 4. Mood: Provide emotional support  Appreciate neuropsych eval  Fluoxetine started on 12/18, increased on 12/26   Continues to be depressed, not completely unrealistic             -antipsychotic agents: N/A 5. Neuropsych: This patient is not capable of making decisions on his own behalf. 6. Skin/Wound Care: Routine skin checks/pressure injury to buttocks.  Follow-up skin care checks 7. Fluids/Electrolytes/Nutrition: Routine in and outs 8.  Atrial fibrillation with RVR.  IV Cardizem discontinued due to hypotension.  No current plan for anticoagulation  Monitor with increased mobility. 9.  Neurogenic bowel bladder with recurrent UTIs.  Indwelling Foley catheter tube to remain in place at the recommendations of urology services per plan outpatient evaluation for possible need for suprapubic tube  UA on 12/21 suggestive of infection, urine culture showing Pseudomonas, pansensitive  Ciprofloxacin course completed on 1/2  Continue foley  10.  Dysphagia.  Advanced to Dysphagia # 3, nectars  Continue to advance diet as tolerated. 11.AKI: Resolved  Creatinine within normal limits on 1/1, labs pending  D/ced IVF 12. Spasticity in setting of MS  Baclofen  increased to home dose of 10 mg 3 times daily on 12/17, decreased back to 5 mg 3 times daily on 12/19 due to lethargy, increased back to 10 mg on 12/30.    Monitor for lethargy  Patient does not want any further aggressive management at present  13. Steroid induced hyperglycemia: ?Resolved  Cont to monitor 14.  Hypernatremia: Resolved  Sodium 142 on 1/1  Cont to monitor 15. Hypoalbuminemia  Supplement initiated on 12/16 16.  Transaminitis  LFTs elevated, but improving on 12/21, labs pending  Continue to monitor 17. ABLA: Resolved  Hb 13.9 on 12/21  Cont to monitor 18.  Neurogenic bowel  Bowel meds increased on 12/27  Overall improving  LOS: 20 days A FACE TO FACE EVALUATION WAS PERFORMED  Idonna Heeren Lorie Phenix 12/27/2019, 8:23 AM

## 2019-12-27 NOTE — Progress Notes (Signed)
Physical Therapy Session Note  Patient Details  Name: Stephen Wade MRN: [id number] Date of Birth: 21-Dec-1951  Today's Date: 12/27/2019 PT Individual Time: 0800-0814 PT Individual Time Calculation (min): 14 min  and Today's Date: 12/27/2019 PT Missed Time: 46 Minutes Missed Time Reason: Patient fatigue;Patient unwilling to participate  Short Term Goals: Week 2:  PT Short Term Goal 1 (Week 2): Pt will perform bed mobility with mod assist PT Short Term Goal 1 - Progress (Week 2): Progressing toward goal PT Short Term Goal 2 (Week 2): Pt will transfer to [facility] with mod assist PT Short Term Goal 2 - Progress (Week 2): Progressing toward goal PT Short Term Goal 3 (Week 2): Pt will perform sit<>stand with mod assist and BUE support on rail or in stedy PT Short Term Goal 3 - Progress (Week 2): Progressing toward goal PT Short Term Goal 4 (Week 2): Pt will propel WC >137ft with supevision assist PT Short Term Goal 4 - Progress (Week 2): Progressing toward goal Week 3:  PT Short Term Goal 1 (Week 3): Pt will perform bed mobility with mod assist PT Short Term Goal 2 (Week 3): Pt will transfer to [facility] with mod assist PT Short Term Goal 3 (Week 3): Pt will perform sit<>stand with mod assist and BUE support on rail or in stedy PT Short Term Goal 4 (Week 3): Pt will propel WC >113ft with supevision assist  Skilled Therapeutic Interventions/Progress Updates:   Received pt supine in bed asleep, upon awakening pt unwilling to participate in therapy stating "I'm done with this". Pt encouraged to participate however pt continued to state "I'm ready to go". Therapist discussed goals for getting pt back home to be comfortable and educated pt on importance of Hoyer lift and use of hospital bed at home to decrease caregiver burden. Pt nodded in agreeance however did not seem motivated to participate in discussion, let alone therapy. Pt stated "please, just let me go, I have nothing left". PT attempted to proved emotional  support and encouragement however, pt unreceptive. Concluded session with pt supine in bed, needs within reach, bed alarm on. 46 minutes missed of skilled physical therapy.  Therapy Documentation Precautions:  Precautions Precautions: Fall Precaution Comments: hx MS (R LE deficits > L LE, spastic diplegia) Restrictions Weight Bearing Restrictions: No   Therapy/Group: Individual Therapy Martin Majestic PT, DPT   12/27/2019, 7:29 AM

## 2019-12-27 NOTE — Progress Notes (Signed)
Pt continues to refuse his oral meds. Participates in water protocol.

## 2019-12-27 NOTE — Progress Notes (Signed)
Occupational Therapy Note  Patient Details  Name: Stephen Wade MRN: [id number] Date of Birth: 01/05/1951  Today's Date: 12/27/2019 OT Missed Time: 60 Minutes Missed Time Reason: Patient unwilling/refused to participate without medical reason;Patient fatigue  Pt missed 60 mins scheduled OT treatment session secondary to fatigue. Per discussion with primary PT, pt refusing therapy this AM.  Upon arrival, nurse tech exiting after assisting with bedlevel bath.  Pt appearing fatigued and when encouraged to participate in therapy session, pt just looked at therapist.  Provided encouragement, to which pt continued to just have flat stare.  Pt did stated "tired" in a whisper and when asked if he would prefer to rest, he nodded slightly.  Rosalio Loud 12/27/2019, 12:21 PM

## 2019-12-27 NOTE — Progress Notes (Signed)
Pt continues to refuse all medications today.

## 2019-12-28 ENCOUNTER — Inpatient Hospital Stay (HOSPITAL_COMMUNITY): Payer: Medicare Other | Admitting: Occupational Therapy

## 2019-12-28 ENCOUNTER — Ambulatory Visit (HOSPITAL_COMMUNITY): Payer: Medicare Other

## 2019-12-28 ENCOUNTER — Inpatient Hospital Stay (HOSPITAL_COMMUNITY): Payer: Medicare Other | Admitting: Speech Pathology

## 2019-12-28 ENCOUNTER — Encounter (HOSPITAL_COMMUNITY): Payer: Medicare Other | Admitting: Psychology

## 2019-12-28 DIAGNOSIS — Z7189 Other specified counseling: Secondary | ICD-10-CM

## 2019-12-28 DIAGNOSIS — Z515 Encounter for palliative care: Secondary | ICD-10-CM

## 2019-12-28 DIAGNOSIS — K592 Neurogenic bowel, not elsewhere classified: Secondary | ICD-10-CM

## 2019-12-28 NOTE — Progress Notes (Signed)
Pt has refused therapies this am and all medications.  While this nurse was in room his personal phone was ringing and seemed upset that it was ringing. Asked pt multiple times if there was anything I could do for him and he shut his eyes and said NO. Asked if he was going to go to therapy today and he replied NO. All needs met, call bell in reach and bed in low position.

## 2019-12-28 NOTE — Progress Notes (Signed)
Speech Language Pathology Daily Session Note  Patient Details  Name: Stephen Wade MRN: [id number] Date of Birth: 12/13/1951  Today's Date: 12/28/2019 SLP Individual Time: 3299-2426 SLP Individual Time Calculation (min): 46 min  Short Term Goals: Week 3: SLP Short Term Goal 1 (Week 3): Pt will consume least restrictive diet with minimal overt s/sx aspiration, efficient mastication and oral clearance, and Supervision A for use of swallow strategies. SLP Short Term Goal 2 (Week 3): Pt will demonstrate efficient mastication and oral clearnace of Dys 3 (mech soft) solids X2 prior to upgrade. SLP Short Term Goal 3 (Week 3): Pt will participate in instrumental MBSS to reassess oropharyngeal swallow function and assess readiness for liquid advancement. SLP Short Term Goal 4 (Week 3): Pt will demonstrate recall of new/day to day information with Min A verbal/visual cues for use of compensatory strategies and/or external aids. SLP Short Term Goal 5 (Week 3): Pt will demonstrate ability to problem solve during functional mildly complex tasks with Min A verbal/visual cues. SLP Short Term Goal 6 (Week 3): Pt will use speech intelligibility strategies with Supervision A verbal cues to achieve 80% intelligibility at the sentence level.  Skilled Therapeutic Interventions: Pt was seen for skilled ST targeting dysphagia and cognitive goals. Pt was very hesitant to engage in therapy this morning, and after 2 bites of soft peaches pt refused any additional PO intake despite Max encouragement and education from SLP. Pt only requesting ice, however had to be informed he would need to wait at least 30 minutes until he could consume it (in accordance with water protocol). He did verbally recall need to perform oral care prior to ice chips. Pt appeared very discouraged and stated "I'm giving up". SLP continued to encourage pt and reinforcement of working toward therapy goals and d/c date. Focus shifted to functional recall  of information and problem solving. Pt reported recognizing a familiar semi-complex card task, however required Mod A verbal cues to recall 3/3 rules (Blink). Pt only required Min A verbal cues for problem solving, Mod A verbal cues for endurance and initiation throughout card task. After 30 minutes and pt's completion of oral care via suction toothbrush, pt consumed ice chips X5 with 1 immediate cough and wet vocal quality X1 noted. Mod A verbal cues for accuracy in timing of implementation of chin tuck strategy required. Pt pushed tray away and eventually refused to participate any further, therefore pt missed 14 minutes of skilled ST. Pt left laying in bed with alarm set and all needs within reach. Continue per current plan of care.       Pain Pain Assessment Pain Scale: Faces Faces Pain Scale: No hurt  Therapy/Group: Individual Therapy  Little Ishikawa 12/28/2019, 8:21 AM

## 2019-12-28 NOTE — Progress Notes (Signed)
Occupational Therapy Session Note  Patient Details  Name: Stephen Wade MRN: [id number] Date of Birth: 06-12-1951  Today's Date: 12/28/2019 OT Individual Time: 6503-5465 OT Individual Time Calculation (min): 24 min  and Today's Date: 12/28/2019 OT Missed Time: 21 Minutes Missed Time Reason: Patient unwilling/refused to participate without medical reason;Patient fatigue   Short Term Goals: Week 3:  OT Short Term Goal 1 (Week 3): Pt will complete LB dressing with max A OT Short Term Goal 2 (Week 3): Pt will complete transfer bed > roll in shower chair/wheelchair with max assist of one caregiver OT Short Term Goal 3 (Week 3): Pt will complete UB dressing with min A OT Short Term Goal 4 (Week 3): Pt will maintain dynamic sitting balance with Mod assist during self-care tasks  Skilled Therapeutic Interventions/Progress Updates:  Treatment session with focus on encouragement to participate in treatment session and goals of care.  Pt received asleep upon arrival, but aroused easily.  Pt reporting "I'm just so tired" when encouraged to participate in any activity.  When asked further about pt limited participate in therapy sessions, pt stating "I'm ready to go" and "they are going to have to carry me out." Discussed shifting focus of therapy to focus on hands on family education and use of lift for transfers in/out of bed.  Pt with minimal interaction, but shaking head in agreement.  Pt again stating "I'm just so tired".  Pt remained supine in bed with all needs in reach.  Therapy Documentation Precautions:  Precautions Precautions: Fall Precaution Comments: hx MS (R LE deficits > L LE, spastic diplegia) Restrictions Weight Bearing Restrictions: No Pain: Pain Assessment Pain Scale: Faces Faces Pain Scale: No hurt   Therapy/Group: Individual Therapy  Rosalio Loud 12/28/2019, 10:11 AM

## 2019-12-28 NOTE — Progress Notes (Signed)
NT came to this nurse after noting family member Verlon Au asked for ice.  Entered room and Verlon Au was feeding pt a cream soup, verified that she does have permission to feed pt. Was also giving water. Educated her on risk for aspiration PNU. She informed me that is aware but he isn't going to be doing therapy any longer so what is the point? Encouraged her to try a magic cup to see if likes it.

## 2019-12-28 NOTE — Consult Note (Signed)
Was scheduled to see patient today, but the patient has visit with palliative care tomorrow at 2 and patient and wife are to talk before and meet with them tomorrow.  It was determined that my visit will be more effective tomorrow after team conference and meeting with palliative care.

## 2019-12-28 NOTE — Progress Notes (Signed)
[facility] PHYSICAL MEDICINE & REHABILITATION PROGRESS NOTE  Subjective/Complaints: Patient seen sitting up in bed this morning.  He states he slept well overnight.  He denies complaints.  ROS: Denies CP, SOB, N/V/D  Objective: Vital Signs: Blood pressure 119/83, pulse (!) 103, temperature (!) 97.5 F (36.4 C), temperature source Oral, resp. rate 18, height 6' (1.829 m), weight 88.5 kg, SpO2 96 %. No results found. Recent Labs    12/27/19 0638  WBC 5.5  HGB 14.3  HCT 44.2  PLT 331   Recent Labs    12/27/19 0638  NA 141  K 4.1  CL 108  CO2 23  GLUCOSE 95  BUN 22  CREATININE 0.87  CALCIUM 9.6    Physical Exam: BP 119/83 (BP Location: Right Arm)   Pulse (!) 103   Temp (!) 97.5 F (36.4 C) (Oral)   Resp 18   Ht 6' (1.829 m)   Wt 88.5 kg   SpO2 96%   BMI 26.46 kg/m   Constitutional: No distress . Vital signs reviewed. HENT: Normocephalic.  Atraumatic. Eyes: EOMI. No discharge. Cardiovascular: No JVD. Respiratory: Normal effort.  No stridor. GI: Non-distended. Skin: Warm and dry.  Intact. Psych: Flat. Musc: Lower extremity edema. GU: + Foley Neuro: Alert Dysarthria, stable Motor:  Right lower extremity: 1/5 proximal distal, unchanged Left lower extremity: Hip flexion, knee extension 1+/5, ankle dorsiflexion 1+/5, unchanged Extensor tone b/l LE R>L, unchanged  Assessment/Plan: 1. Functional deficits secondary to debility which require 3+ hours per day of interdisciplinary therapy in a comprehensive inpatient rehab setting.  Physiatrist is providing close team supervision and 24 hour management of active medical problems listed below.  Physiatrist and rehab team continue to assess barriers to discharge/monitor patient progress toward functional and medical goals  Care Tool:  Bathing    Body parts bathed by patient: Right arm, Left arm, Chest, Abdomen, Front perineal area, Right upper leg, Left upper leg, Face   Body parts bathed by helper: Buttocks,  Right lower leg, Left lower leg     Bathing assist Assist Level: Moderate Assistance - Patient 50 - 74%     Upper Body Dressing/Undressing Upper body dressing   What is the patient wearing?: Pull over shirt    Upper body assist Assist Level: Moderate Assistance - Patient 50 - 74%    Lower Body Dressing/Undressing Lower body dressing      What is the patient wearing?: Pants, Incontinence brief     Lower body assist Assist for lower body dressing: 2 Helpers     Toileting Toileting    Toileting assist Assist for toileting: Dependent - Patient 0%     Transfers Chair/bed transfer  Transfers assist  Chair/bed transfer activity did not occur: Safety/medical concerns  Chair/bed transfer assist level: 2 Helpers     Locomotion Ambulation   Ambulation assist   Ambulation activity did not occur: Safety/medical concerns          Walk 10 feet activity   Assist  Walk 10 feet activity did not occur: Safety/medical concerns        Walk 50 feet activity   Assist Walk 50 feet with 2 turns activity did not occur: Safety/medical concerns         Walk 150 feet activity   Assist Walk 150 feet activity did not occur: Safety/medical concerns         Walk 10 feet on uneven surface  activity   Assist Walk 10 feet on uneven surfaces activity did not  occur: Safety/medical concerns         Wheelchair     Assist Will patient use wheelchair at discharge?: Yes Type of Wheelchair: Manual    Wheelchair assist level: Supervision/Verbal cueing Max wheelchair distance: 51ft    Wheelchair 50 feet with 2 turns activity    Assist        Assist Level: Supervision/Verbal cueing   Wheelchair 150 feet activity     Assist Wheelchair 150 feet activity did not occur: Safety/medical concerns          Medical Problem List and Plan: 1.  Decreased functional mobility secondary to acute respiratory distress with hypoxic respiratory failure  secondary to multi focal pneumonia in the setting of MS.    Follow-up chest x-ray 12/07/2019 bibasilar pulmonary infiltrates improved from prior exams, repeat chest x-ray on 12/21 personally reviewed showing?  Persistence of infiltrate- monitor at present per Pulm.   Patient completed 10-day course of Augmentin on 12/17  Completed 5-day course of IV Solu-Medrol.    Continue CIR   Palliative care consulted, appreciate recs-DNR/DNI.  No changes in current management while in rehab 2.  Antithrombotics: DVT/anticoagulation: Subcutaneous Lovenox.  CT angiogram of chest negative for pulmonary emboli  CBC within normal limits on 1/4             Antiplatelet therapy: N/A 3. Pain Management/spasticity: Continue meds, see #12 4. Mood: Provide emotional support  Appreciate neuropsych eval  Fluoxetine started on 12/18, increased on 12/26   Continues to be depressed, not completely unrealistic-refusing meds             -antipsychotic agents: N/A 5. Neuropsych: This patient is not capable of making decisions on his own behalf. 6. Skin/Wound Care: Routine skin checks/pressure injury to buttocks.  Follow-up skin care checks 7. Fluids/Electrolytes/Nutrition: Routine in and outs 8.  Atrial fibrillation with RVR.  IV Cardizem discontinued due to hypotension.  No current plan for anticoagulation  Monitor with increased mobility. 9.  Neurogenic bowel bladder with recurrent UTIs.  Indwelling Foley catheter tube to remain in place at the recommendations of urology services per plan outpatient evaluation for possible need for suprapubic tube  UA on 12/21 suggestive of infection, urine culture showing Pseudomonas, pansensitive  Ciprofloxacin course completed on 1/2  Continue foley  10.  Dysphagia.  Advanced to Dysphagia # 3, nectars  Continue to advance diet as tolerated. 11.AKI: Resolved  Creatinine within normal limits on 1/4  D/ced IVF 12. Spasticity in setting of MS  Baclofen increased to home dose of 10  mg 3 times daily on 12/17, decreased back to 5 mg 3 times daily on 12/19 due to lethargy, increased back to 10 mg on 12/30.    Monitor for lethargy  Patient does not want any further aggressive management at present, refusing baclofen at present 13. Steroid induced hyperglycemia: ?Resolved  Cont to monitor 14.  Hypernatremia: Resolved  Sodium 142 on 1/1  Cont to monitor 15. Hypoalbuminemia  Supplement initiated on 12/16 16.  Transaminitis  ALT elevated, but improving on 1/4  Continue to monitor 17. ABLA: Resolved  Hb 13.9 on 12/21  Cont to monitor 18.  Neurogenic bowel  Bowel meds increased on 12/27  Overall improving  LOS: 21 days A FACE TO FACE EVALUATION WAS PERFORMED  Stephen Wade Stephen Wade 12/28/2019, 8:48 AM

## 2019-12-28 NOTE — Progress Notes (Signed)
Physical Therapy Weekly Progress Note  Patient Details  Name: Stephen Wade MRN: [id number] Date of Birth: 1951/10/29  Beginning of progress report period: December 08, 2019 End of progress report period: December 28, 2019  Today's Date: 12/28/2019 PT Individual Time: 8372-9021 PT Individual Time Calculation (min): 22 min  and Today's Date: 12/28/2019 PT Missed Time: 38 Minutes Missed Time Reason: Patient fatigue;Patient unwilling to participate  Patient has met 0 of 4 short term goals. Pt continues to demonstrate decreased participation in therapy consistently stating "i'm done". Therapist adjusted and set new goals. Pt and wife currently discussing discharge option of home vs another facility. Therapy will continue to work on dynamic sitting balance, bed mobility, and bed<>chair transfers with slideboard. If pt and wife decide to discharge home, therapist will assist with family education for [facility] lift transfers.   Patient continues to demonstrate the following deficits muscle weakness, impaired timing and sequencing, abnormal tone, decreased coordination and decreased motor planning and decreased sitting balance, decreased standing balance, decreased postural control and decreased balance strategies and therefore will continue to benefit from skilled PT intervention to increase functional independence with mobility.  Patient progressing toward long term goals..  Continue plan of care.  PT Short Term Goals Week 3:  PT Short Term Goal 1 (Week 3): Pt will perform bed mobility with mod assist PT Short Term Goal 1 - Progress (Week 3): Not met PT Short Term Goal 2 (Week 3): Pt will transfer to [facility] with mod assist PT Short Term Goal 2 - Progress (Week 3): Not met PT Short Term Goal 3 (Week 3): Pt will perform sit<>stand with mod assist and BUE support on rail or in stedy PT Short Term Goal 3 - Progress (Week 3): Not met PT Short Term Goal 4 (Week 3): Pt will propel WC >140f with supevision  assist PT Short Term Goal 4 - Progress (Week 3): Not met Week 4:  PT Short Term Goal 1 (Week 4): STG= LTG due to LOS  Skilled Therapeutic Interventions/Progress Updates:  Discharge planning;DME/adaptive equipment instruction;Functional mobility training;Pain management;Psychosocial support;Therapeutic Activities;UE/LE Strength taining/ROM;Balance/vestibular training;Community reintegration;Disease management/prevention;Functional electrical stimulation;Neuromuscular re-education;Patient/family education;Ambulation/gait training;Therapeutic Exercise;UE/LE Coordination activities;Wheelchair propulsion/positioning   Today's Interventions: Received pt supine in bed, wife present at bedside. Pt refused to participate in therapy stating "I'm done". Pt and wife educated on options for potential discharge but pt stated he needed time to discuss with wife what he wanted to do. Therapist educated pt and wife on need for family education training with [facility] if wanting to discharge home; pt and wife verbalized understanding and agreement. Therapist suggested LE stretching, however pt again refused stating "I appreciate what you're doing but I'm done." Therapist notified social work of pt's current status and that wife and pt were discussing potential options for discharge. 38 minutes missed of skilled physical therapy.   Therapy Documentation Precautions:  Precautions Precautions: Fall Precaution Comments: hx MS (R LE deficits > L LE, spastic diplegia) Restrictions Weight Bearing Restrictions: No  Therapy/Group: Individual Therapy AAlfonse AlpersPT, DPT   12/28/2019, 7:41 AM

## 2019-12-28 NOTE — Plan of Care (Signed)
  Problem: RH Eating Goal: LTG Patient will perform eating w/assist, cues/equip (OT) Description: LTG: Patient will perform eating with assist, with/without cues using equipment (OT) Outcome: Not Applicable Flowsheets (Taken 12/28/2019 1521) LTG: Pt will perform eating with assistance level of: (D/C) -- Note: Defer to SLP at this time   Problem: RH Toileting Goal: LTG Patient will perform toileting task (3/3 steps) with assistance level (OT) Description: LTG: Patient will perform toileting task (3/3 steps) with assistance level (OT)  Outcome: Not Applicable Flowsheets (Taken 12/28/2019 1521) LTG: Pt will perform toileting task (3/3 steps) with assistance level: (D/C) -- Note: D/C as pt using foley and frequently incontinent will require total assist for toileting   Problem: RH Tub/Shower Transfers Goal: LTG Patient will perform tub/shower transfers w/assist (OT) Description: LTG: Patient will perform tub/shower transfers with assist, with/without cues using equipment (OT) Outcome: Not Applicable Flowsheets (Taken 12/28/2019 1521) LTG: Pt will perform tub/shower stall transfers with assistance level of: (D/C as pt will not be able to access home shower) -- Note: D/C as pt will not be able to access home shower   Problem: RH Awareness Goal: LTG: Patient will demonstrate awareness during functional activites type of (OT) Description: LTG: Patient will demonstrate awareness during functional activites type of (OT) Outcome: Not Applicable Flowsheets (Taken 12/28/2019 1521) LTG: Patient will demonstrate awareness during functional activites type of (OT): (D/C as not appropriate) -- Note: D/C as not appropriate   Problem: RH Balance Goal: LTG: Patient will maintain dynamic sitting balance (OT) Description: LTG:  Patient will maintain dynamic sitting balance with assistance during activities of daily living (OT) Flowsheets (Taken 12/28/2019 1521) LTG: Pt will maintain dynamic sitting balance during  ADLs with: (downgraded) Moderate Assistance - Patient 50 - 74% Note: Downgraded due to decreased activity tolerance and motivation   Problem: RH Bathing Goal: LTG Patient will bathe all body parts with assist levels (OT) Description: LTG: Patient will bathe all body parts with assist levels (OT) Flowsheets (Taken 12/28/2019 1521) LTG: Pt will perform bathing with assistance level/cueing: (downgraded) Moderate Assistance - Patient 50 - 74% Note: Downgraded due to decreased activity tolerance and motivation   Problem: RH Dressing Goal: LTG Patient will perform upper body dressing (OT) Description: LTG Patient will perform upper body dressing with assist, with/without cues (OT). Flowsheets (Taken 12/28/2019 1521) LTG: Pt will perform upper body dressing with assistance level of: (Downgraded due to decreased activity tolerance and motivation) Moderate Assistance - Patient 50 - 74% Note: Downgraded due to decreased activity tolerance and motivation Goal: LTG Patient will perform lower body dressing w/assist (OT) Description: LTG: Patient will perform lower body dressing with assist, with/without cues in positioning using equipment (OT) Flowsheets (Taken 12/28/2019 1521) LTG: Pt will perform lower body dressing with assistance level of: (Downgraded due to decreased activity tolerance and motivation) Maximal Assistance - Patient 25 - 49% Note: Downgraded due to decreased activity tolerance and motivation   Problem: RH Toilet Transfers Goal: LTG Patient will perform toilet transfers w/assist (OT) Description: LTG: Patient will perform toilet transfers with assist, with/without cues using equipment (OT) Flowsheets (Taken 12/28/2019 1521) LTG: Pt will perform toilet transfers with assistance level of: Maximal Assistance - Patient 25 - 49%

## 2019-12-28 NOTE — Plan of Care (Signed)
  Problem: RH Balance Goal: LTG Patient will maintain dynamic sitting balance (PT) Description: LTG:  Patient will maintain dynamic sitting balance with assistance during mobility activities (PT) Outcome: Not Applicable Flowsheets (Taken 12/28/2019 1452) LTG: Pt will maintain dynamic sitting balance during mobility activities with:: (D/C) -- Note: D/C Goal: LTG Patient will maintain dynamic standing balance (PT) Description: LTG:  Patient will maintain dynamic standing balance with assistance during mobility activities (PT) Outcome: Not Applicable Flowsheets (Taken 12/28/2019 1452) LTG: Pt will maintain dynamic standing balance during mobility activities with:: (D/C) -- Note: D/C   Problem: Sit to Stand Goal: LTG:  Patient will perform sit to stand with assistance level (PT) Description: LTG:  Patient will perform sit to stand with assistance level (PT) Outcome: Not Applicable Flowsheets (Taken 12/28/2019 1452) LTG: PT will perform sit to stand in preparation for functional mobility with assistance level: (D/C) -- Note: D/C   Problem: RH Bed Mobility Goal: LTG Patient will perform bed mobility with assist (PT) Description: LTG: Patient will perform bed mobility with assistance, with/without cues (PT). Outcome: Not Applicable Flowsheets (Taken 12/28/2019 1452) LTG: Pt will perform bed mobility with assistance level of: (D/C) -- Note: D/C   Problem: RH Bed to Chair Transfers Goal: LTG Patient will perform bed/chair transfers w/assist (PT) Description: LTG: Patient will perform bed to chair transfers with assistance (PT). Outcome: Not Applicable Flowsheets (Taken 12/28/2019 1452) LTG: Pt will perform Bed to Chair Transfers with assistance level: (D/C) -- Note: D/C   Problem: RH Wheelchair Mobility Goal: LTG Patient will propel w/c in controlled environment (PT) Description: LTG: Patient will propel wheelchair in controlled environment, # of feet with assist (PT) Outcome: Not  Applicable Flowsheets (Taken 12/28/2019 1452) LTG: Pt will propel w/c in controlled environ  assist needed:: (D/C) -- Note: D/C Goal: LTG Patient will propel w/c in home environment (PT) Description: LTG: Patient will propel wheelchair in home environment, # of feet with assistance (PT). Outcome: Not Applicable Flowsheets (Taken 12/28/2019 1452) LTG: Pt will propel w/c in home environ  assist needed:: (D/C) -- Note: D/C

## 2019-12-28 NOTE — Plan of Care (Signed)
  Problem: RH Swallowing Goal: LTG Patient will consume least restrictive diet using compensatory strategies with assistance (SLP) Description: LTG:  Patient will consume least restrictive diet using compensatory strategies with assistance (SLP) Flowsheets (Taken 12/28/2019 1527) LTG: Pt Patient will consume least restrictive diet using compensatory strategies with assistance of (SLP): Minimal Assistance - Patient > 75%  Downgraded due to lack of progress

## 2019-12-28 NOTE — Progress Notes (Signed)
Palliative:  HPI: 69 yo gentleman with history of MS diagnosed 2005 admitted 11/27/19 with confusion and weakness from home where he lives with his wife. Baseline is wheelchair bound and has R sided weakness. Hospitalization was complicated by hypothermia, resp distress, hypotension with septic shock, UTI, aspiration pneumonia. He and his wife have previously met with palliative care and have elected for DNR and trial of rehab. Transition to CIR 12/07/19.   I met today initially with Stephen Wade who was sleeping comfortably when I came to visit. Stephen Wade expresses to me that he is "tired." He is tired of feeling sick and tired of therapy and taking medications and working so hard for a quality of life that he no longer feels is acceptable. His wife joins [place] and we discuss the frustration in wanting so badly to be stronger and get better and this becomes extremely frustrating when the physical body is not cooperating. We discussed the options of hospice if he truly is tired and no longer wants to continue with current care. He does not wish to take his medications, work with therapy, and appetite is very poor. Wife, Stephen Wade, is appropriately tearful as this has been such roller coaster of emotions for them. She is supportive of whatever Stephen Wade decides. I encouraged them to continue to speak privately with each other and that there is no need for any rash decisions. Stephen Wade shares how she wants him to improve but also admits to seeing him suffer for the past 15 years with MS. The comfort to them is that he does not really have any symptoms causing discomfort at current time. Stephen Wade shares that he has expressed NO desire for artificial feeding as well. Stephen Wade continues to endorse desire for full comfort care and hospice by the end of our conversation.   I left Stephen Wade and Stephen Wade to speak privately amongst themselves. I educated that I am available by phone for any questions/concerns and will check in with them tomorrow to  see how they feel about our discussion. I did speak with my colleague, Lexine Baton, who has previously met with Stephen Wade and Stephen Wade and she does note that he has previously expressed he may be nearing the point of giving all that he can but had previously been resistant and wishing to continue on before reaching the point of hospice. Will follow up tomorrow to determine conversation and consistency of goals of care.   Exam: Generalized weakness. Overall flat affect with bouts of tearfulness appropriate during this discussion. Not very conversational. No distress. Breathing regular unlabored. Abd soft, not distended.   Plan: - Family discussing goals of care and considering hospice options.  - Has refused medications since 1/2 including fluoxetine.  - I will follow up tomorrow.   [place], NP Palliative Medicine Team Pager [phone] (Please see [email or web address] for schedule) Team Phone [phone]    Greater than 50%  of this time was spent counseling and coordinating care related to the above assessment and plan

## 2019-12-28 NOTE — Progress Notes (Signed)
Pt continues to refuse all meds but lovenox. Does well with water protocol. Was pleasant and conversational during that time of monitoring of protocol. Slept well throughout night tonight

## 2019-12-28 NOTE — Plan of Care (Signed)
  Problem: RH Pre-functional/Other (Specify) Goal: RH LTG SLP (Specify) 1 Description: RH LTG SLP (Specify) 1 Flowsheets (Taken 12/28/2019 1533) LTG: Other SLP (Specify) 1: Pt will verbally recall risk of aspiration with thin liquids and current diet recommendations with Mod A verbal cues.  Goal added due to changes in pt and family focus

## 2019-12-29 ENCOUNTER — Inpatient Hospital Stay (HOSPITAL_COMMUNITY): Payer: Medicare Other | Admitting: Speech Pathology

## 2019-12-29 ENCOUNTER — Inpatient Hospital Stay (HOSPITAL_COMMUNITY): Payer: Medicare Other | Admitting: Occupational Therapy

## 2019-12-29 ENCOUNTER — Inpatient Hospital Stay (HOSPITAL_COMMUNITY): Payer: Medicare Other

## 2019-12-29 ENCOUNTER — Encounter (HOSPITAL_COMMUNITY): Payer: Medicare Other | Admitting: Psychology

## 2019-12-29 DIAGNOSIS — F329 Major depressive disorder, single episode, unspecified: Secondary | ICD-10-CM

## 2019-12-29 MED ORDER — FLUOXETINE HCL 20 MG PO CAPS
20.0000 mg | ORAL_CAPSULE | Freq: Every day | ORAL | Status: DC
  Filled 2019-12-29 (×3): qty 1

## 2019-12-29 NOTE — Progress Notes (Signed)
Physical Therapy Session Note  Patient Details  Name: Stephen Wade MRN: [id number] Date of Birth: 1951-02-08  Today's Date: 12/29/2019 PT Individual Time: 1300-1339 PT Individual Time Calculation (min): 39 min  PT Missed Time: 21 minutes due to fatigue/unwillingness to participate   Short Term Goals: Week 3:  PT Short Term Goal 1 (Week 3): Pt will perform bed mobility with mod assist PT Short Term Goal 1 - Progress (Week 3): Not met PT Short Term Goal 2 (Week 3): Pt will transfer to [facility] with mod assist PT Short Term Goal 2 - Progress (Week 3): Not met PT Short Term Goal 3 (Week 3): Pt will perform sit<>stand with mod assist and BUE support on rail or in stedy PT Short Term Goal 3 - Progress (Week 3): Not met PT Short Term Goal 4 (Week 3): Pt will propel WC >166f with supevision assist PT Short Term Goal 4 - Progress (Week 3): Not met Week 4:  PT Short Term Goal 1 (Week 4): STG= LTG due to LOS  Skilled Therapeutic Interventions/Progress Updates:   Received pt supine in bed, pt more awake today and engaged in conversation. Wife present during session. Pt agreeable to therapy and denied any pain throughout session. Recreational therapy present during session. Pt fatigued from previous OT session and from getting out of bed, but agreeable to activity in room. Discussion had with wife and pt regarding plan for discharge home. Therapist educated wife that if the decision was to return home, wife would need to participate in [facility]. Pt and wife verbalized understanding, but stated they still have not come to a decision regarding discharge location. Pt requested LE stretching at start of session. Therapist performed passive bilateral LE stretching into hip flexion, knee flexion, abduction, and adduction. Pt with increased facial grimacing with stretching but when asked if stretch was painful pt shook head no and stated "tight". Pt with increased fatigue throughout  session, requiring max verbal and tactile stimulation to remain awake. Pt with eyes closed towards end of session. Pt able to actively hold single knee to chest stretch for approximately 15 seconds, but unable to sustain hold due to increased effort and difficulty remaining engaged. Therapist recommended activity sitting EOB however, pt refused despite multiple attempts by therapist and wife at convincing. Pt stated he wanted to rest and requested therapist to cover him up. Therapist provided pressure relief and repositioning in bed with pillows for pt comfort. Concluded session with pt supine in bed, needs within reach, bed alarm on, and wife present at bedside. 21 minutes missed of skilled physical therapy due to fatigue and unwillingness to participate.   Therapy Documentation Precautions:  Precautions Precautions: Fall Precaution Comments: hx MS (R LE deficits > L LE, spastic diplegia) Restrictions Weight Bearing Restrictions: No   Therapy/Group: Individual Therapy AAlfonse AlpersPT, DPT   12/29/2019, 7:40 AM

## 2019-12-29 NOTE — Progress Notes (Signed)
Pt reports feeling much better today. He also reports that may go to some therapy today. Asked for therapy sheet to review. Also refused morning medications but reported that if he changes his mind that will let me know.

## 2019-12-29 NOTE — Progress Notes (Signed)
Pt continues to refuse all meds but lovenox. Digital disimpaction with +medium stool results, slept well over night

## 2019-12-29 NOTE — Patient Care Conference (Signed)
Inpatient RehabilitationTeam Conference and Plan of Care Update Date: 12/29/2019   Time: 11:30 AM    Patient Name: Stephen Wade      Medical Record Number: [id number]  Date of Birth: 12/17/1951 Sex: Male         Room/Bed: 4W01C/4W01C-01 Payor Info: Payor: MEDICARE / Plan: MEDICARE PART A AND B / Product Type: *No Product type* /    Admit Date/Time:  12/07/2019  3:31 PM  Primary Diagnosis:  Debility  Patient Active Problem List   Diagnosis Date Noted  . Depressive reaction   . Goals of care, counseling/discussion   . Encounter for hospice care discussion   . Palliative care encounter   . Neurogenic bowel   . Slow transit constipation   . Acute lower UTI   . Neurogenic bladder   . Spasticity   . Depression   . Debility 12/08/2019  . Acute blood loss anemia   . Transaminitis   . Hypoalbuminemia due to protein-calorie malnutrition ([place])   . Hypernatremia   . Steroid-induced hyperglycemia   . AKI (acute kidney injury) ([place])   . Dysphagia   . Acute respiratory failure ([place]) 12/07/2019  . Chronic indwelling Foley catheter 12/07/2019  . Acute respiratory failure with hypoxia ([place]) 11/28/2019  . Pneumonia 11/28/2019  . Atrial fibrillation with RVR ([place]) 11/28/2019  . UTI (urinary tract infection) 11/27/2019  . Acute metabolic encephalopathy 84/69/6295  . Urinary retention 06/22/2019  . PCP NOTES >>>>>>> 05/24/2018  . Depression, recurrent ([place]) 05/24/2018  . History of recurrent UTIs 05/22/2018  . Pressure injury of skin 11/06/2017  . Multiple sclerosis ([place]) 11/04/2017  . Skin ulcer of sacral region ([place]) 05/26/2017  . Vitamin D deficiency 01/16/2016  . Spastic gait 02/10/2015  . Spastic diplegia ([place]) 02/10/2015  . Other fatigue 02/10/2015  . Disturbed cognition 02/10/2015  . Edema 05/12/2014  . Hypogonadism male 03/11/2013  . BPH - self caths 12/26/2011  . GERD 11/30/2010  . URINARY CALCULUS 10/05/2010    Expected Discharge Date: Expected Discharge Date:  (TBD)  Team Members Present: Physician leading conference: Dr. Delice Lesch Social Worker Present: Lennart Pall, LCSW Nurse Present: Isla Pence, RN Case Manager: Karene Fry, RN PT Present: Becky Sax, PT OT Present: Simonne Come, OT SLP Present: Jettie Booze, CF-SLP PPS Coordinator present : Gunnar Fusi, Novella Olive, PT     Current Status/Progress Goal Weekly Team Focus  Bowel/Bladder   BM requiring stimulation, chronic foley cath  bowel regimen, foley care, avoid UTI  assess q shift and prn   Swallow/Nutrition/ Hydration   Dys 3/nectar, water protocol  Supervision A  tolerance D3/nectar, water protocol, education with pt and his wife   ADL's   Limited participation again with pt reporting tired of therapy and tired of feeling tired.  Continues to require max assist +2 for transfers with slide board when willing to get OOB.  Mod assist bathing at shower level when upright in shower.  have downgraded goals to mod assist sitting balance, mod assist bathing, max assist transfers with focus on family education and most likely lift training for transfers and bathing/dressing at bed level as shower is not roll-in shower  ADL retraining, sitting balance, transfers, pt/family education, d/c planning   Mobility   Pt refusing to participate in therapy stating "i'm done". Able to roll mod/min A, supine<>sit +2, slideboard transfers +2  Pt will be able to direct caregiver to assist with transfers and pt will remain OOB for at least 1 hr each day  Participation in therapy, fmaily education, discharge planning, transfers, bed mobility, endurance   Communication   Supervision A  Supervision A  speech goals met, carryover of skills and education   Safety/Cognition/ Behavioral Observations  Min-Mod A  Min A  education, short term recall   Pain   no c/o pain, refusing scheduled meds  remain free of pain  assess q shift and prn   Skin   MASD to buttock, areas of stage II aquacel in place  no  new areas of skin break down, improve MASD buy allowing areas to dry, enc q2 turn  assess q shift and prn    Rehab Goals Patient on target to meet rehab goals: No Rehab Goals Revised: Pt has been refusing sessions this week - Palliative Care to follow up today. *See Care Plan and progress notes for long and short-term goals.     Barriers to Discharge  Current Status/Progress Possible Resolutions Date Resolved   Nursing                  PT  Medical stability  Decreased participation and increase in R LE tone              OT                  SLP                SW                Discharge Planning/Teaching Needs:  Wife here daily and working on hiring assist. Pt and wife to meet with Palliative Care this afternoon and make decisions on d/c of home vs SNF (leaning toward home d/c still)  Harrel Lemon education still to be done.   Team Discussion: Refusing meds, wants to go home, palliative care to meet at 2 pm today, wife involved.  RN better mood, has been resistant. OT wanted to shower, but not enough time, got new chair, max/tot +2 slide board, brushed teeth, goals bed level self care, feels confused.  PT refused yesterday and day before, mod/max goals, fam ed with hoyer lift?  SLP water protocol, ed about oral care.   Revisions to Treatment Plan: N/A     Medical Summary Current Status: Decreased functional mobility secondary to acute respiratory distress with hypoxic respiratory failure secondary to multi focal pneumonia in the setting of MS Weekly Focus/Goal: Improve mobility, swallowing, depression, neurogenic bowel/bladder, dysphagia  Barriers to Discharge: Medical stability;Neurogenic Bowel & Bladder;Nutrition means;Wound care;Medication compliance   Possible Resolutions to Barriers: Therapies, continue to advance diet as tolerated, follow labs, encourage compliance   Continued Need for Acute Rehabilitation Level of Care: The patient requires daily medical management by a physician  with specialized training in physical medicine and rehabilitation for the following reasons: Direction of a multidisciplinary physical rehabilitation program to maximize functional independence : Yes Medical management of patient stability for increased activity during participation in an intensive rehabilitation regime.: Yes Analysis of laboratory values and/or radiology reports with any subsequent need for medication adjustment and/or medical intervention. : Yes   I attest that I was present, lead the team conference, and concur with the assessment and plan of the team.   Retta Diones 12/29/2019, 8:25 PM  Team conference was held via web/ teleconference due to [place] - 19

## 2019-12-29 NOTE — Progress Notes (Signed)
Recreational Therapy Session Note  Patient Details  Name: ALOYSIUS Wade MRN: [id number] Date of Birth: 11-26-1951 Today's Date: 12/29/2019 Time:  0165-5374 Pain: no c/o Skilled Therapeutic Interventions/Progress Updates: Pt in bed upon arrival agreeable to get OOB but requesting to remain in the room for therapeutic activities.  Wife also present and observing.  Discussed that if plan was to discharge home with wife providing care, therapy sessions would need transition to a focus on family training/education.  Both stated understanding. PT provided LE stretching prior to OOB attempts.  Once stretching completed, encouraged OOB activities (even sitting EOB) but pt refused requesting to rest and wanting to be covered up.  Pt required tactile and verbal cues for arousal after stretching.  Therapists and wife encouraged EOB/OOB activities but pt continued to refuse.  Repositioned pt including pressure relief measures with +2 assist. Therapy/Group: Co-Treatment   Adriahna Shearman 12/29/2019, 2:17 PM

## 2019-12-29 NOTE — Progress Notes (Signed)
Pt refused foley care and CHG at this time, said "maybe later".

## 2019-12-29 NOTE — Progress Notes (Signed)
Occupational Therapy Weekly Progress Note  Patient Details  Name: Stephen Wade MRN: [id number] Date of Birth: 1951/05/14  Beginning of progress report period: December 22, 2019 End of progress report period: December 29, 2019  Today's Date: 12/29/2019 OT Individual Time: 3428-7681 OT Individual Time Calculation (min): 32 min    Patient has met 0 of 4 short term goals.  Pt has made little to no progress towards goals.  Pt continues to demonstrate fluctuating participation in therapy sessions.  Pt had reported repeatedly that therapy is "too much" and he is "ready to go".  Palliative care has been consulted and is involved in pt care and goal planning.  Therapy is awaiting family decision about d/c disposition.  Pt continues to require max-total assist +2 for transfers with slide board or manual lift.  Pt has demonstrated ability to complete bed mobility with mod assist and sidelying to sitting max assist.  Mod assist for static sitting balance and bathing when in supported sitting position (roll in shower chair).  Continues to require +2 for LB dressing at bed level.  Patient continues to demonstrate the following deficits: muscle weakness,decreased cardiorespiratoy endurance,decreased visual motor skills,decreased safety awareness and decreased memoryand decreased sitting balance, decreased standing balance, decreased postural control and decreased balance strategies and therefore will continue to benefit from skilled OT intervention to enhance overall performance with BADL and Reduce care partner burden.  Patient not progressing toward long term goals.  See goal revision..  Plan of care revisions: downgraded to mod assist bathing and UB dressing and max assist LB dressing at bed level.  Focus on transfers with therapy with max assist goal.  OT Short Term Goals Week 3:  OT Short Term Goal 1 (Week 3): Pt will complete LB dressing with max A OT Short Term Goal 1 - Progress (Week 3): Not met OT  Short Term Goal 2 (Week 3): Pt will complete transfer bed > roll in shower chair/wheelchair with max assist of one caregiver OT Short Term Goal 2 - Progress (Week 3): Not met OT Short Term Goal 3 (Week 3): Pt will complete UB dressing with min A OT Short Term Goal 3 - Progress (Week 3): Not met OT Short Term Goal 4 (Week 3): Pt will maintain dynamic sitting balance with Mod assist during self-care tasks OT Short Term Goal 4 - Progress (Week 3): Not met Week 4:  OT Short Term Goal 1 (Week 4): STG = LTG due to remaining LOS  Skilled Therapeutic Interventions/Progress Updates:    Treatment session with focus on participation in functional mobility and transfers.  Pt received supine in bed reporting "confused".  Pt reporting that he is frequently unsure if things happen or if they are dreams.  Reminded pt of scenarios from yesterday and provided encouragement for participation in therapy session this date.  Pt agreeable to getting OOB.  Completed bed mobility with mod-max assist with pt initiating rolling and weight shift during sidelying to sitting, but requiring up to max assist due to trunk instability.  Completed slide board transfer bed > w/c requiring max assist +2.  Pt able to follow one step commands and assist with positioning to allow for transfer.  Engaged in oral care and changing shirt while seated upright at sink.  Mod assist for weight shifting to spit in sink during oral care and to allow for shirt to be pulled down over torso.  Pt agreeable to remaining OOB until 1200.  Left tilted back in tilt in space  w/c with seat belt alarm fastened and all needs in reach.  Therapy Documentation Precautions:  Precautions Precautions: Fall Precaution Comments: hx MS (R LE deficits > L LE, spastic diplegia) Restrictions Weight Bearing Restrictions: No Pain: Pain Assessment Pain Scale: Faces Faces Pain Scale: No hurt   Therapy/Group: Individual Therapy  Simonne Come 12/29/2019, 12:27 PM

## 2019-12-29 NOTE — Progress Notes (Addendum)
Palliative:  HPI: 69 yo gentleman with history of MS diagnosed 2005 admitted 11/27/19 with confusion and weakness from home where he lives with his wife. Baseline is wheelchair bound and has R sided weakness. Hospitalization was complicated by hypothermia, resp distress, hypotension with septic shock, UTI, aspiration pneumonia. He and his wife have previously met with palliative care and have elected for DNR and trial of rehab. Transition to CIR 12/07/19. Goals of care continue to fluctuate with requests for full comfort care some days.   I met again today at [name] bedside with his wife, Stephen Wade. He is sleeping during most of my visit but does arouse and acknowledge me. Stephen Wade tells me how he is in better spirits today and telling her that he will take some of his medications and try to work with therapy. He confirms that he is feeling better today but then goes back to sleep. I offered supportive listening to [place]. We discussed that it is good to know our options (previously discussed hospice options) but until he consistently expresses desire for comfort we will continue to support him the best we are able.   Stephen Wade also talks about working with [facility] towards getting him home. She tells me that they have discussed nursing facility/SNF as well. We discussed the reality that he would decline very quickly in SNF as she would not be able to be with him and I fear he would likely lose all his motivation at that point. Stephen Wade agrees. She also understands the challenges of taking him home but feels this will be best for their family. She knows she will need lift and they have a hospital bed already. I also encouraged self care for her.   All questions/concerns addressed. Emotional support provided.   Exam: Sleepy. No distress. Breathing regular, unlabored.   Plan: - Continue with therapy.  - Consider medications in honey to try and hide the taste. He does not like the taste of pills crushed.  - Will  need outpatient palliative referral to follow at home. Please recommend in d/c summary. Would benefit from MOST form but I think this will be better to complete once he is home with hopes of more consistent goals. Still may desire transition to hospice at home in the near future.   [place], NP Palliative Medicine Team Pager [phone] (Please see [email or web address] for schedule) Team Phone [phone]    Greater than 50%  of this time was spent counseling and coordinating care related to the above assessment and plan

## 2019-12-29 NOTE — Consult Note (Signed)
Neuropsychological Consultation   Patient:   Stephen Wade   DOB:   [birth year]  MR Number:  [id number]  Location:  [facility] [address] CENTER A [address] [id number] [place] [address] Dept: [phone] Loc: [phone]           Date of Service:   12/29/2019  Start Time:   3 PM End Time:   3:45 PM  Provider/Observer:  Arley Phenix, Psy.D.       Clinical Neuropsychologist       Billing Code/Service: 96158/[id number]  Chief Complaint:    Stephen Wade is a 69 year old male with history of MS.  Presented 11/27/2019 with increased weakness decreased functional mobility.  CT/MRI no new intracranial abnormality and stable since 2018 with changes consistent with chronic demyelination.  Pneumonia developed but COVID 19 negative.  Patient with debility, history of depression.  Reason for Service:  The patient was referred for neuropsychological consultation due to coping and adjustment issues with a history of depression.  Below is the HPI for the current admission.  HPI: Stephen Wade is a 23 year old right-handed male with history of multiple sclerosis followed by [name] maintained on Ocrevus 300 mg every six months.  Recurrent lower urinary tract symptoms/self caths followed by [name], remote tobacco abuse.  Per chart review patient lives with spouse.  Two-level home with bed and bath main level with ramped entrance.  He did have a home health aide for some ADLs however aide was recently COVID-19 positive.  Patient primarily used a power scooter prior to admission he was able to do scoot pivot transfers.  He has been essentially nonambulatory for 8 years.  Presented 11/27/2019 with increased weakness decreased functional mobility.   Cranial CT/MRI no new intracranial abnormality stable since 2018 with changes compatible with chronic demyelination.  Chest x-ray did show infiltrate right base representing pneumonia versus  aspiration.  CT angiogram of the chest negative for pulmonary emboli.  Patient was placed on a 5-day course of IV Solu-Medrol for pseudoflare of multiple sclerosis.  Admission chemistries hemoglobin 13.8/hematocrit 42.1, sodium 146, BUN 30, SARS coronavirus negative, urine culture greater 100,000 group B strep ( S.Agalactiae).  EKG showed atrial fibrillation RVR heart rates 120s to 140s he was started on IV Cardizem initially did require nonrebreather mask.  He was transferred to stepdown telemetry 12/01/2019.  His Cardizem was later discontinued due to bouts of hypotension no current plan for anticoagulation at this time.  Initially maintained on IV Rocephin transition to vancomycin and Zosyn.  MRSA negative and Zosyn was changed to Augmentin completing a 10-day course.  A follow-up chest x-ray was completed 12/07/2019 showed bibasilar pulmonary infiltrates improved from prior exams.  In regards to patient's recurrent UTIs now with urinary tract infection a Foley catheter tube was placed plan was to follow-up outpatient urology services for consideration of suprapubic catheter.FOLEY TUBE IS not to be removed.  Subcutaneous Lovenox for DVT prophylaxis.  Noted pressure injury blistered area mid bilateral buttocks with generalized wound care.  Palliative care was consulted to establish goals of care.  Patient is currently on dysphagia #1 nectar thick liquid diet and close monitoring of hydration with lattest BUN 34 up from 31-27-21  and received a fluid bolus 12/07/2019.  Therapy evaluations completed and patient was admitted for a comprehensive rehab program.  Current Status:  The patient along with consultation with wife and having had discussions with palliative care etc has decided to  finish up with rehab and have finial therapy sessions with Speech, OT, PT, and visit with attending to go over what needs to be done at home and discharge once these appointments can be finished.  Patient does appear to be competent  to make decision and denies this is attempt to end his life but that he wants to be home even if there is risk and that he may not make the functional gains he could make with continued CIR.     Behavioral Observation: Stephen Wade  presents as a 66 y.o.-year-old Right Caucasian Male who appeared his stated age. his dress was Appropriate and he was Well Groomed and his manners were Appropriate to the situation.  his participation was indicative of Appropriate and Redirectable behaviors.  There were any physical disabilities noted.  he displayed an appropriate level of cooperation and motivation.     Interactions:    Active Redirectable  Attention:   abnormal and attention span appeared shorter than expected for age  Memory:   abnormal; global memory impairment noted  Visuo-spatial:  not examined  Speech (Volume):  low  Speech:   slurred; garbled  Thought Process:  Coherent and Relevant  Though Content:  WNL; not suicidal and not homicidal  Orientation:   person, place, time/date and situation  Judgment:   Good  Planning:   Fair  Affect:    Depressed and Lethargic  Mood:    Dysphoric  Insight:   Fair  Intelligence:   normal  Medical History:   Past Medical History:  Diagnosis Date  . GERD (gastroesophageal reflux disease)    had on- off chest pain, symptoms resolved with Prilosec 11/2010  . Lower urinary tract symptoms (LUTS)    Dr Gaynelle Arabian  . MS (multiple sclerosis) ([place]) 2005  . Testicular hypogonadism   . Urge incontinence of urine   . Urolithiasis 2011    Psychiatric History:  Past history of depression  Family Med/Psych History:  Family History  Problem Relation Age of Onset  . Heart attack Mother   . Brain cancer Father   . Colon cancer Neg Hx   . Prostate cancer Neg Hx     Impression/DX:  Stephen Wade is a 69 year old male with history of MS.  Presented 11/27/2019 with increased weakness decreased functional mobility.  CT/MRI no new intracranial  abnormality and stable since 2018 with changes consistent with chronic demyelination.  Pneumonia developed but COVID 19 negative.  Patient with debility, history of depression.  The patient along with consultation with wife and having had discussions with palliative care etc has decided to finish up with rehab and have finial therapy sessions with Speech, OT, PT, and visit with attending to go over what needs to be done at home and discharge once these appointments can be finished.  Patient does appear to be competent to make decision and denies this is attempt to end his life but that he wants to be home even if there is risk and that he may not make the functional gains he could make with continued CIR.    Diagnosis:    Debility  Depressive Disorder.        Electronically Signed   _______________________ Ilean Skill, Psy.D.

## 2019-12-29 NOTE — Progress Notes (Signed)
Stephen Wade PHYSICAL MEDICINE & REHABILITATION PROGRESS NOTE  Subjective/Complaints: Patient seen sitting up in bed this morning.  Overnight.  He states he is improving.  Engaged in leg reposition.  Patient states he feels better after repositioning.  ROS: Denies CP, SOB, N/V/D  Objective: Vital Signs: Blood pressure 133/78, pulse 98, temperature 97.9 F (36.6 C), temperature source Oral, resp. rate 16, height 6' (1.829 m), weight 88.5 kg, SpO2 95 %. No results found. Recent Labs    12/27/19 0638  WBC 5.5  HGB 14.3  HCT 44.2  PLT 331   Recent Labs    12/27/19 0638  NA 141  K 4.1  CL 108  CO2 23  GLUCOSE 95  BUN 22  CREATININE 0.87  CALCIUM 9.6    Physical Exam: BP 133/78 (BP Location: Right Arm)   Pulse 98   Temp 97.9 F (36.6 C) (Oral)   Resp 16   Ht 6' (1.829 m)   Wt 88.5 kg   SpO2 95%   BMI 26.46 kg/m   Constitutional: No distress . Vital signs reviewed. HENT: Normocephalic.  Atraumatic. Eyes: EOMI. No discharge. Cardiovascular: No JVD. Respiratory: Normal effort.  No stridor. GI: Non-distended. Skin: Warm and dry.  Intact. Psych: Flat. Musc: Lower extremity edema. GU: + Foley Neuro: Alert Dysarthria, unchanged Motor:  Right lower extremity: 1/5 proximal distal, stable Left lower extremity: Hip flexion, knee extension 1+/5, ankle dorsiflexion 1+/5, unchanged Extensor tone b/l LE R>L, stable  Assessment/Plan: 1. Functional deficits secondary to debility which require 3+ hours per day of interdisciplinary therapy in a comprehensive inpatient rehab setting.  Physiatrist is providing close team supervision and 24 hour management of active medical problems listed below.  Physiatrist and rehab team continue to assess barriers to discharge/monitor patient progress toward functional and medical goals  Care Tool:  Bathing    Body parts bathed by patient: Right arm, Left arm, Chest, Abdomen, Front perineal area, Right upper leg, Left upper leg, Face    Body parts bathed by helper: Buttocks, Right lower leg, Left lower leg     Bathing assist Assist Level: Moderate Assistance - Patient 50 - 74%     Upper Body Dressing/Undressing Upper body dressing   What is the patient wearing?: Pull over shirt    Upper body assist Assist Level: Moderate Assistance - Patient 50 - 74%    Lower Body Dressing/Undressing Lower body dressing      What is the patient wearing?: Pants, Incontinence brief     Lower body assist Assist for lower body dressing: 2 Helpers     Toileting Toileting    Toileting assist Assist for toileting: Dependent - Patient 0%     Transfers Chair/bed transfer  Transfers assist  Chair/bed transfer activity did not occur: Safety/medical concerns  Chair/bed transfer assist level: 2 Helpers     Locomotion Ambulation   Ambulation assist   Ambulation activity did not occur: Safety/medical concerns          Walk 10 feet activity   Assist  Walk 10 feet activity did not occur: Safety/medical concerns        Walk 50 feet activity   Assist Walk 50 feet with 2 turns activity did not occur: Safety/medical concerns         Walk 150 feet activity   Assist Walk 150 feet activity did not occur: Safety/medical concerns         Walk 10 feet on uneven surface  activity   Assist Walk 10 feet  on uneven surfaces activity did not occur: Safety/medical concerns         Wheelchair     Assist Will patient use wheelchair at discharge?: Yes Type of Wheelchair: Manual    Wheelchair assist level: Supervision/Verbal cueing Max wheelchair distance: 73ft    Wheelchair 50 feet with 2 turns activity    Assist        Assist Level: Supervision/Verbal cueing   Wheelchair 150 feet activity     Assist Wheelchair 150 feet activity did not occur: Safety/medical concerns          Medical Problem List and Plan: 1.  Decreased functional mobility secondary to acute respiratory distress  with hypoxic respiratory failure secondary to multi focal pneumonia in the setting of MS.    Follow-up chest x-ray 12/07/2019 bibasilar pulmonary infiltrates improved from prior exams, repeat chest x-ray on 12/21 personally reviewed showing?  Persistence of infiltrate- monitor at present per Pulm.   Patient completed 10-day course of Augmentin on 12/17  Completed 5-day course of IV Solu-Medrol.    Continue CIR   Palliative care consulted, appreciate recs-DNR/DNI.  No changes in current management while in rehab  Team conference today to discuss current and goals and coordination of care, home and environmental barriers, and discharge planning with nursing, case manager, and therapies.  2.  Antithrombotics: DVT/anticoagulation: Subcutaneous Lovenox.  CT angiogram of chest negative for pulmonary emboli  CBC within normal limits on 1/4             Antiplatelet therapy: N/A 3. Pain Management/spasticity: Continue meds, see #12 4. Mood: Provide emotional support  Appreciate neuropsych eval  Fluoxetine started on 12/18, increased on 12/26   Continues to be depressed, not completely unrealistic    Continues to refuse meds             -antipsychotic agents: N/A 5. Neuropsych: This patient is not capable of making decisions on his own behalf. 6. Skin/Wound Care: Routine skin checks/pressure injury to buttocks.  Follow-up skin care checks 7. Fluids/Electrolytes/Nutrition: Routine in and outs 8.  Atrial fibrillation with RVR.  IV Cardizem discontinued due to hypotension.  No current plan for anticoagulation  Monitor with increased mobility. 9.  Neurogenic bowel bladder with recurrent UTIs.  Indwelling Foley catheter tube to remain in place at the recommendations of urology services per plan outpatient evaluation for possible need for suprapubic tube  UA on 12/21 suggestive of infection, urine culture showing Pseudomonas, pansensitive  Ciprofloxacin course completed on 1/2  Continue foley  10.   Dysphagia.  Advanced to Dysphagia # 3, nectar  Continue to advance diet as tolerated. 11.AKI: Resolved  Creatinine within normal limits on 1/4  D/ced IVF 12. Spasticity in setting of MS  Baclofen increased to home dose of 10 mg 3 times daily on 12/17, decreased back to 5 mg 3 times daily on 12/19 due to lethargy, increased back to 10 mg on 12/30.    Monitor for lethargy  Patient does not want any further aggressive management at present, refusing medications at present 13. Steroid induced hyperglycemia: ?Resolved  Cont to monitor 14.  Hypernatremia: Resolved  Sodium 142 on 1/1  Cont to monitor 15. Hypoalbuminemia  Supplement initiated on 12/16 16.  Transaminitis  ALT elevated, but improving on 1/4  Continue to monitor 17. ABLA: Resolved  Hb 13.9 on 12/21  Cont to monitor 18.  Neurogenic bowel  Bowel meds increased on 12/27  Overall stable  LOS: 22 days A FACE TO FACE EVALUATION WAS  PERFORMED  Mette Southgate Karis Juba 12/29/2019, 8:17 AM

## 2019-12-29 NOTE — Progress Notes (Signed)
Speech Language Pathology Weekly Progress and Session Note  Patient Details  Name: Stephen Wade MRN: [id number] Date of Birth: Nov 17, 1951  Beginning of progress report period: December 22, 2019 End of progress report period: December 29, 2019  Today's Date: 12/29/2019 SLP Individual Time: 3202-3343 SLP Individual Time Calculation (min): 30 min  Short Term Goals: Week 3: SLP Short Term Goal 1 (Week 3): Pt will consume least restrictive diet with minimal overt s/sx aspiration, efficient mastication and oral clearance, and Supervision A for use of swallow strategies. SLP Short Term Goal 1 - Progress (Week 3): Progressing toward goal SLP Short Term Goal 2 (Week 3): Pt will demonstrate efficient mastication and oral clearnace of Dys 3 (mech soft) solids X2 prior to upgrade. SLP Short Term Goal 2 - Progress (Week 3): Met SLP Short Term Goal 3 (Week 3): Pt will participate in instrumental MBSS to reassess oropharyngeal swallow function and assess readiness for liquid advancement. SLP Short Term Goal 3 - Progress (Week 3): Met SLP Short Term Goal 4 (Week 3): Pt will demonstrate recall of new/day to day information with Min A verbal/visual cues for use of compensatory strategies and/or external aids. SLP Short Term Goal 4 - Progress (Week 3): Progressing toward goal SLP Short Term Goal 5 (Week 3): Pt will demonstrate ability to problem solve during functional mildly complex tasks with Min A verbal/visual cues. SLP Short Term Goal 5 - Progress (Week 3): Met SLP Short Term Goal 6 (Week 3): Pt will use speech intelligibility strategies with Supervision A verbal cues to achieve 80% intelligibility at the sentence level. SLP Short Term Goal 6 - Progress (Week 3): Met    New Short Term Goals: Week 4: SLP Short Term Goal 1 (Week 4): STG=LTG due to remaining LOS  Weekly Progress Updates: Despite pt's fluctuating fatigue and mixed participation, he made relatively functional gains this reporting period and  met 4 out of 6 short term goals. Pt is currently ~Min assist due to oropharyngeal dysphagia, dysarthria, and cognitive impairments impacting short term recall and problem solving. Pt's current recommended diet is Dysphagia 3 (mech soft) and nectar thick liquids, although overall PO intake has been very poor and pt continues to report decreased quality of life on current diet recommendations. Pt is also on free water protocol and his wife is an approved/trained caregiver to provide the otocol at pt's request; he and his wife have verbally acknowledged aspiration risk as well as history of silent aspiration (detected on previous MBSS) and still wish to continue water protocol. Pt has demonstrated improved verbal recall of aspiration risk, swallow precautions, use speech intelligibility strategies, as well as basic and mildly complex problem solving. Pt and family education is ongoing. Pt would continue to benefit from skilled ST while inpatient in order to maximize functional independence and reduce burden of care prior to discharge. Some goals have been adjusted due to recent change in focus in regards to pt's goals of care and d/c plans. Anticipate that pt will need 24/7 supervision at discharge in addition to [place] follow up at next level of care if he wishes to work toward greater comfort and independence (although pt and his wife may wish not to pursue follow up ST if they decide to move toward comfort care, still TBD).      Intensity: Minumum of 1-2 x/day, 30 to 90 minutes Frequency: 3 to 5 out of 7 days Duration/Length of Stay: 01/04/19, however may be sooner due to pt and family shifting focus,  contemplating home with comfort care goals vs SNF Treatment/Interventions: Cognitive remediation/compensation;Cueing hierarchy;Dysphagia/aspiration precaution training;Functional tasks;Patient/family education;Therapeutic Activities;Speech/Language facilitation;Internal/external aids   Daily Session  Skilled  Therapeutic Interventions: Pt was seen for skilled ST targeting dysphagia goals and extensive education and d/c/goal planning with pt. Pt verbally recalled risks of aspiration of thin liquids with Min A question cues from clinician. He specifically stated "it can go the wrong way into my lungs" and "I can aspirate and get pneumonia". SLP also reinforced recommendation for thorough oral care prior to PO intake, educated regarding the pillars of aspiration pneumonia and impact of poor oral hygiene. Pt made SLP aware he never wants to pursue a PEG (despite limited PO intake), and stated he and his wife were still contemplating goals of care and whether he would like to d/c with goals that are targeting comfort as opposed to current track, vs SNF, etc. Treatment team will continue to engage in conversations with pt and his wife regarding goals of care to direct goals/efforts during his remaining time in CIR.  Following thorough oral care performed by pt via suction (with set up assist), pt consumed ~6oz thin H2O with immediate cough X1, immediate throat clear X1, followed by wet vocal quality and delayed throat clearing throughout remainder of session. When provided with rest breaks, pt agreeable to perform 5 sets of 5 EMST exercises (total of 25) with device set to 16 cm H2O resistance. He only required Supervision A verbal cues for accuracy in use of device today.      Pain Pain Assessment Pain Scale: Faces Faces Pain Scale: No hurt  Therapy/Group: Individual Therapy  Arbutus Leas 12/29/2019, 10:33 AM

## 2019-12-30 ENCOUNTER — Inpatient Hospital Stay (HOSPITAL_COMMUNITY): Payer: Medicare Other

## 2019-12-30 ENCOUNTER — Encounter (HOSPITAL_COMMUNITY): Payer: Medicare Other | Admitting: Occupational Therapy

## 2019-12-30 ENCOUNTER — Inpatient Hospital Stay (HOSPITAL_COMMUNITY): Payer: Medicare Other | Admitting: Speech Pathology

## 2019-12-30 MED ORDER — OMEPRAZOLE 20 MG PO CPDR: 20.0000 mg | DELAYED_RELEASE_CAPSULE | Freq: Every day | ORAL | 0 refills | Status: DC | PRN

## 2019-12-30 MED ORDER — BACLOFEN 10 MG PO TABS: 10.0000 mg | ORAL_TABLET | Freq: Three times a day (TID) | ORAL | 1 refills | Status: AC

## 2019-12-30 MED ORDER — ACETAMINOPHEN 325 MG PO TABS: 650.0000 mg | ORAL_TABLET | Freq: Four times a day (QID) | ORAL | Status: DC | PRN

## 2019-12-30 MED ORDER — FLUOXETINE HCL 20 MG PO CAPS: 20.0000 mg | ORAL_CAPSULE | Freq: Every day | ORAL | 3 refills | Status: DC

## 2019-12-30 NOTE — Discharge Summary (Addendum)
Physician Discharge Summary  Patient ID: Stephen Wade MRN: [id number] DOB/AGE: [birth year] 69 y.o.  Admit date: 12/07/2019 Discharge date: 12/31/2019  Discharge Diagnoses:  Principal Problem:   Debility Active Problems:   Skin ulcer of sacral region [facility])   Multiple sclerosis (HCC)   Acute metabolic encephalopathy   Acute respiratory failure (HCC)   Chronic indwelling Foley catheter   Acute blood loss anemia   Transaminitis   Hypoalbuminemia due to protein-calorie malnutrition (HCC)   Hypernatremia   Steroid-induced hyperglycemia   AKI (acute kidney injury) (HCC)   Dysphagia   Depression   Spasticity   Neurogenic bladder   Acute lower UTI   Slow transit constipation   Neurogenic bowel   Goals of care, counseling/discussion   Encounter for hospice care discussion   Palliative care encounter   Depressive reaction   Noncompliance   Discharged Condition: Stable  Significant Diagnostic Studies: DG Chest 2 View  Result Date: 12/13/2019 CLINICAL DATA:  Sepsis. EXAM: CHEST - 2 VIEW COMPARISON:  12/07/2019 FINDINGS: The cardiac silhouette, mediastinal and hilar contours are within normal limits and stable. Low lung volumes with vascular crowding and streaky bibasilar atelectasis. Persistent patchy left basilar density could reflect persistent infiltrate. No pleural effusions. IMPRESSION: Low lung volumes with vascular crowding and streaky bibasilar atelectasis. Persistent right basilar density, likely persistent infiltrate. Electronically Signed   By: Rudie Meyer M.D.   On: 12/13/2019 11:41   DG CHEST PORT 1 VIEW  Result Date: 12/07/2019 CLINICAL DATA:  Cephalopathy. EXAM: PORTABLE CHEST 1 VIEW COMPARISON:  CT 11/29/2019.  Chest x-ray 11/28/2019. FINDINGS: Heart size stable. No pulmonary venous congestion. Bibasilar infiltrates, improved from prior exams. No pleural effusion or pneumothorax. IMPRESSION: Bibasilar pulmonary infiltrates, improved from prior exams. Continued  follow-up exams to demonstrate complete clearing suggested. Electronically Signed   By: Maisie Fus  Register   On: 12/07/2019 13:47   DG Swallowing Func-Speech Pathology  Result Date: 12/23/2019 Objective Swallowing Evaluation: Type of Study: MBS-Modified Barium Swallow Study  Patient Details Name: Stephen Wade MRN: [id number] Date of Birth: [birth year] Today's Date: 12/23/2019 Time: SLP Start Time (ACUTE ONLY): 1000 -SLP Stop Time (ACUTE ONLY): 1015 SLP Time Calculation (min) (ACUTE ONLY): 15 min Past Medical History: Past Medical History: Diagnosis Date  GERD (gastroesophageal reflux disease)   had on- off chest pain, symptoms resolved with Prilosec 11/2010  Lower urinary tract symptoms (LUTS)   Dr Patsi Sears  MS (multiple sclerosis) [facility]) 2005  Testicular hypogonadism   Urge incontinence of urine   Urolithiasis 2011 Past Surgical History: Past Surgical History: Procedure Laterality Date  APPENDECTOMY     ruptured 03-2010  VASECTOMY   HPI: HPI: Stephen Wade is a 69 year old right-handed male with history of multiple sclerosis followed by [name] maintained on Ocrevus 300 mg every six months.  Recurrent lower urinary tract symptoms/self caths followed by [name], remote tobacco abuse.  Per chart review patient lives with spouse.  Two-level home with bed and bath main level with ramped entrance.  He did have a home health aide for some ADLs however aide was recently COVID-19 positive.  Patient primarily used a power scooter prior to admission he was able to do scoot pivot transfers.  He has been essentially nonambulatory for 8 years.  Presented 11/27/2019 with increased weakness decreased functional mobility.   Cranial CT/MRI no new intracranial abnormality stable since 2018 with changes compatible with chronic demyelination.  Chest x-ray did show infiltrate right base representing pneumonia versus aspiration.  CT angiogram of the chest  negative for pulmonary emboli.  Patient was placed on a 5-day course of  IV Solu-Medrol for pseudoflare of multiple sclerosis.  Admission chemistries hemoglobin 13.8/hematocrit 42.1, sodium 146, BUN 30, SARS coronavirus negative, urine culture greater 100,000 group B strep ( S.Agalactiae).  EKG showed atrial fibrillation RVR heart rates 120s to 140s he was started on IV Cardizem initially did require nonrebreather mask.  He was transferred to stepdown telemetry 12/01/2019.  His Cardizem was later discontinued due to bouts of hypotension no current plan for anticoagulation at this time.  Initially maintained on IV Rocephin transition to vancomycin and Zosyn.  MRSA negative and Zosyn was changed to Augmentin completing a 10-day course.  A follow-up chest x-ray was completed 12/07/2019 showed bibasilar pulmonary infiltrates improved from prior exams.  In regards to patient's recurrent UTIs now with urinary tract infection a Foley catheter tube was placed plan was to follow-up outpatient urology services for consideration of suprapubic catheter.FOLEY TUBE IS not to be removed.  Subcutaneous Lovenox for DVT prophylaxis.  Noted pressure injury blistered area mid bilateral buttocks with generalized wound care.  Palliative care was consulted to establish goals of care.  Patient is currently on dysphagia #1 nectar thick liquid diet and close monitoring of hydration with lattest BUN 34 up from 31-27-21  and received a fluid bolus 12/07/2019.  Assessment / Plan / Recommendation CHL IP CLINICAL IMPRESSIONS 12/23/2019 Clinical Impression -- Pt presents with severe oropharyngeal swallow function largely unchanged since last MBSS 11/30/19, characterized primarily by consistent silent aspiration of thin liquids. Although pt's swallow initiation was mostly timely with thin liquids, his epiglottis does not fully invert and laryngeal vestibule closure is incomplete, resulting in aspiration which was never sensed by the pt. Unfortunately, chin tuck compensatory strategy was ineffective to provide any  additional airway protection with thin. Volitional cough remains mostly weak and congested and inefficient to completely clear aspirates. Pt's airway protection significantly improved with trials of nectar thick barium; aspiration prior to swallow initiation noted in only 1 out of 4 trials, due to delayed initiation of swallow sequence to the level of the pyriform sinuses. However, nectar aspirate was sensed by pt, evidenced by reflexive cough response. One additional instance of penetration of nectar into laryngeal vestibule noted without ejection noted. Pt's oral phase was remarkable for some oral holding and reduced bolus cohesion resulting in intermittent piecemeal swallowing of thin and nectar thick liquids. Pt demonstrated Min lingual pumping and slightly delayed oral transit of Dys 3 (mech soft) solids, however airway protection intact. Recommend pt upgrade to Dys 3 texture diet, continue nectar thick liquids, medications crushed in puree. Please ensure full supervision to ensure use of swallow strategies - slow rate, small bites/sips, intermittent dry swallow and cough/throat clear. ST will continue to follow to provide skilled interventions for dysphagia and ensure diet safety and efficiency. SLP Visit Diagnosis Dysphagia, oropharyngeal phase (R13.12) Attention and concentration deficit following -- Frontal lobe and executive function deficit following -- Impact on safety and function Risk for inadequate nutrition/hydration;Moderate aspiration risk   CHL IP TREATMENT RECOMMENDATION 11/30/2019 Treatment Recommendations Therapy as outlined in treatment plan below   Prognosis 11/30/2019 Prognosis for Safe Diet Advancement Fair Barriers to Reach Goals Severity of deficits;Time post onset Barriers/Prognosis Comment -- CHL IP DIET RECOMMENDATION 12/23/2019 SLP Diet Recommendations Dysphagia 3 (Mech soft) solids;Nectar thick liquid Liquid Administration via Cup;Spoon Medication Administration Crushed with puree  Compensations Slow rate;Small sips/bites;Other (Comment) Postural Changes Seated upright at 90 degrees;Remain semi-upright after after feeds/meals (Comment)  CHL IP OTHER RECOMMENDATIONS 12/23/2019 Recommended Consults -- Oral Care Recommendations Oral care BID;Oral care before and after PO;Staff/trained caregiver to provide oral care Other Recommendations Order thickener from pharmacy;Have oral suction available   CHL IP FOLLOW UP RECOMMENDATIONS 12/23/2019 Follow up Recommendations Home health SLP;Outpatient SLP;24 hour supervision/assistance   CHL IP FREQUENCY AND DURATION 11/30/2019 Speech Therapy Frequency (ACUTE ONLY) min 2x/week Treatment Duration 2 weeks      CHL IP ORAL PHASE 12/23/2019 Oral Phase Impaired Oral - Pudding Teaspoon -- Oral - Pudding Cup -- Oral - Honey Teaspoon NT Oral - Honey Cup -- Oral - Nectar Teaspoon NT Oral - Nectar Cup Holding of bolus;Piecemeal swallowing;Decreased bolus cohesion Oral - Nectar Straw -- Oral - Thin Teaspoon NT Oral - Thin Cup Decreased bolus cohesion;Holding of bolus;Right anterior bolus loss Oral - Thin Straw -- Oral - Puree NT Oral - Mech Soft Lingual pumping;Delayed oral transit Oral - Regular -- Oral - Multi-Consistency -- Oral - Pill -- Oral Phase - Comment --  CHL IP PHARYNGEAL PHASE 12/23/2019 Pharyngeal Phase Impaired Pharyngeal- Pudding Teaspoon -- Pharyngeal -- Pharyngeal- Pudding Cup -- Pharyngeal -- Pharyngeal- Honey Teaspoon NT Pharyngeal -- Pharyngeal- Honey Cup -- Pharyngeal -- Pharyngeal- Nectar Teaspoon NT Pharyngeal -- Pharyngeal- Nectar Cup Penetration/Aspiration before swallow;Penetration/Aspiration during swallow Pharyngeal Material enters airway, passes BELOW cords and not ejected out despite cough attempt by patient;Material enters airway, CONTACTS cords and not ejected out Pharyngeal- Nectar Straw -- Pharyngeal -- Pharyngeal- Thin Teaspoon NT Pharyngeal -- Pharyngeal- Thin Cup Delayed swallow initiation-pyriform sinuses;Reduced epiglottic  inversion;Penetration/Aspiration during swallow Pharyngeal Material enters airway, passes BELOW cords without attempt by patient to eject out (silent aspiration) Pharyngeal- Thin Straw -- Pharyngeal -- Pharyngeal- Puree NT Pharyngeal -- Pharyngeal- Mechanical Soft Delayed swallow initiation-vallecula Pharyngeal -- Pharyngeal- Regular -- Pharyngeal -- Pharyngeal- Multi-consistency -- Pharyngeal -- Pharyngeal- Pill -- Pharyngeal -- Pharyngeal Comment --  CHL IP CERVICAL ESOPHAGEAL PHASE 12/23/2019 Cervical Esophageal Phase WFL Pudding Teaspoon -- Pudding Cup -- Honey Teaspoon -- Honey Cup -- Nectar Teaspoon -- Nectar Cup -- Nectar Straw -- Thin Teaspoon -- Thin Cup -- Thin Straw -- Puree -- Mechanical Soft -- Regular -- Multi-consistency -- Pill -- Cervical Esophageal Comment -- Little Ishikawa 12/23/2019, 11:35 AM               Labs:  Basic Metabolic Panel: Recent Labs  Lab 12/27/19 0638  NA 141  K 4.1  CL 108  CO2 23  GLUCOSE 95  BUN 22  CREATININE 0.87  CALCIUM 9.6    CBC: Recent Labs  Lab 12/27/19 0638  WBC 5.5  NEUTROABS 3.3  HGB 14.3  HCT 44.2  MCV 92.9  PLT 331    CBG: No results for input(s): GLUCAP in the last 168 hours.  Family history.  Mother with myocardial infarction.  Father brain cancer.  Negative colon cancer prostate cancer  Brief HPI:   Stephen Wade is a 18 y.o. right-handed male with history of multiple sclerosis followed by Dr. Epimenio Foot of neurology services.  Recurrent UTIs self catheterizations followed by [name], remote tobacco abuse.  Per chart review lives with spouse 2 level home.  He did have a home health aide for some ADLs however aide was recently Covid positive.  Patient primarily used a power scooter prior to admission when he was able to do scoot pivot transfers.  He has been essentially nonambulatory for 8 years.  Presented 11/27/2019 with increased weakness decreased functional mobility.  Cranial CT scan and MRI  showed no intracranial abnormality  stable since 2018 with changes compatible with chronic demyelination.  Chest x-ray did show infiltrate right base representing pneumonia versus aspiration.  CT angiogram of the chest negative for pulmonary emboli.  Patient was placed on 5-day course of IV Solu-Medrol for pseudoflare of multiple sclerosis.  Admission chemistries hemoglobin 13.8 hematocrit 42 1, sodium 146, BUN 30, SARS coronavirus negative, urine culture greater than 100,000 group B strep.  EKG showed atrial fibrillation RVR rate 120s to 140s he was placed on intravenous Cardizem initially but did require nonrebreather mask also.  He was transferred to stepdown telemetry 12/01/2019.  His Cardizem was later discontinued due to bouts of hypotension no current plan for anticoagulation at that time.  Initially maintained on intravenous Rocephin transition to vancomycin and Zosyn.  MRSA negative and Zosyn was changed to Augmentin completing a 10-day course.  A follow-up chest x-ray was completed 12/07/2019 showing bibasilar pulmonary infiltrates improved from prior exams.  In regards to patient's recurrent UTIs now with urinary tract infection a Foley catheter tube was placed plan was to follow-up outpatient urology services for consideration of suprapubic catheter.  Foley tube remained in place.  Subcutaneous Lovenox for DVT prophylaxis.  Noted pressure injury blistered area mid bilateral buttocks with generalized wound care.  Palliative care consulted to establish goals of care.  Patient was currently maintained on a dysphagia #1 nectar thick liquid diet and close monitoring of hydration.  Patient was admitted for a comprehensive rehab program   Hospital Course: Stephen Wade was admitted to rehab 12/07/2019 for inpatient therapies to consist of PT, ST and OT at least three hours five days a week. Past admission physiatrist, therapy team and rehab RN have worked together to provide customized collaborative inpatient rehab.  Pertaining to patient's  decreased functional mobility secondary to acute respiratory distress with hypoxic respiratory failure secondary to multifocal pneumonia in the setting of multiple sclerosis.  He was attending therapies with ongoing encouragement.  Patient continued ongoing request for discharge to home and was refusing meds on most days.  Follow-up chest x-ray 12/07/2019 bibasilar pulmonary infiltrates improved from prior exam repeat scans 12/13/2019 reviewed showing persistent infiltrate however patient afebrile he completed 10-day course of Augmentin 12/09/2019 as well as a 5-day course of IV Solu-Medrol for pseudoflare of multiple sclerosis.  Palliative care had been consulted to establish goals of care patient was made DNR.  Subcutaneous Lovenox for DVT prophylaxis CT angiogram of the chest negative.  Pain management with spasticity related to MS baclofen initiated 10 mg 3 times daily decreased to 5 mg 3 times daily due to lethargy and later titrated back to 10 mg 3 times daily the patient tolerated well.  Mood stabilization with the use of Prozac initiated 12/10/2019 increased on 12/18/2019 as well as follow-up per neuropsychology.  Cardiac rate remained controlled patient initially had been on intravenous Cardizem discontinued due to hypotension.  No current other plan for anticoagulation.  Patient with neurogenic bowel bladder recurrent UTIs indwelling Foley catheter tube to been in place he would follow-up urology services there was consideration of possible need for suprapubic tube.  Latest urinalysis showing Pseudomonas pansensitive patient completed course of Cipro 12/24/2018.  Diet was advanced to mechanical soft nectar thick liquid diet close monitoring of hydration.  Close monitoring of skin maintaining skin integrity foam dressing to buttocks as needed.   Blood pressures were monitored on TID basis and soft and monitored   He/ has made gains during rehab stay and is attending  therapies with encouragement  He/  will continue to receive follow up therapies   after discharge  Rehab course: During patient's stay in rehab weekly team conferences were held to monitor patient's progress, set goals and discuss barriers to discharge. At admission, patient required total assist sit to supine with +2 physical assistance, max assist supine to sit.  Minimal assist for eating and feeding, total assist lower body bathing max assist upper body dressing total assist lower body dressing.  Physical exam.  Blood pressure 143/73 pulse 72 respirations 14 oxygen saturations 92% room air Constitutional.  No distress slightly confused slow to process and answer questions HEENT Head.  Normocephalic and atraumatic Mouth and throat no oropharyngeal exudate no facial asymmetry seen Eyes.  Pupils round and reactive to light no discharge without nystagmus Neck.  Supple nontender no tracheal deviation Cardiac regular rate rhythm no murmurs or extra sounds Respiratory.  Fair inspiratory effort no wheezes or rails Abdomen.  Soft nontender positive bowel sounds without rebound Genitourinary Foley tube in place Musculoskeletal.  Upper extremities 4+ out of 5 deltoids biceps triceps grip finger abduction Left lower extremity 1 out of 5 hip flexors knee extension plantar flexion Right lower extremity 0 out of 5 no movement  He/  has had improvement in activity tolerance, balance, postural control as well as ability to compensate for deficits. He/ has had improvement in functional use RUE/LUE  and RLE/LLE as well as improvement in awareness.  Working with energy conservation techniques and ongoing education with wife.  Patient would easily fatigued needing frequent rest breaks.  Ongoing education with wife for need for [facility] lift.  Therapist performed passive bilateral lower extremity stretching into hip flexion, knee flexion, abduction and abduction.  Patient able to actively hold single knee-to-chest stretch for approximately 15 seconds.   Therapy would recommend activity sitting edge of bed however patient would continue to refuse.  Patient continues to require max to total assist +2 for transfers with sliding boards and/or manual lifts.  Patient has demonstrated ability to complete bed mobility with moderate assist in side-lying to sitting max assist.  Moderate assist for static balance and bathing when in supported position.  Family teaching was completed plan was discharged to home       Disposition: Discharge to home There are no questions and answers to display.         Diet: Mechanical soft nectar liquids  Special Instructions: Patient should follow-up with urology services for consideration of suprapubic tube  Medications at discharge. 1.  Tylenol as needed 2.  Baclofen 10 mg p.o. 3 times daily 3.  Prozac 20 mg p.o. nightly 4.  Prilosec as needed 5.  MiraLAX daily hold for loose stools 6.  Artificial tears 1 drop right eye 3 times daily 7.  Senokot S1 tablet p.o. nightly hold for loose stools 8.  Continue ocrevus 300 mg every 6 months as per neurology services  Discharge Instructions     Ambulatory referral to Physical Medicine Rehab   Complete by: As directed    Moderate complexity follow-up 2 weeks debilitation related to acute respiratory failure as well as multiple sclerosis   Ambulatory referral to Urology   Complete by: As directed    Ambulatory referral urology for neurogenic bladder related to multiple sclerosis former patient of [name].  Foley catheter tube in place request evaluation for possible need for suprapubic tube       Follow-up Information     Lovorn, Aundra Millet, MD Follow up.  Specialty: Physical Medicine and Rehabilitation Why: As directed Contact information: [address] [place] [address] [phone]         Asa Lente, MD Follow up.   Specialty: Neurology Why: Call for appointment Contact information: [address] [place]  [address] [phone]            Signed: Charlton Amor 01/03/2020, 5:23 AM Patient was seen, face-face, and physical exam performed by me on day of discharge, greater than 30 minutes of total time spent.. Please see progress note from day of discharge as well.  Maryla Morrow, MD, ABPMR

## 2019-12-30 NOTE — Progress Notes (Signed)
  Patient ID: Stephen Wade, male   DOB: [birth year], 69 y.o.   MRN: [id number]    Diagnosis code:  G35; R53.81; R13.0; A41.9  Height:  6'  Weight:  211 lbs   Patient has end stage MS, debility and s/p sepsis which requires a hoyer lift for all transfers.  Pt is completely dependent on caregiver for transfers in and out of bed and wheelchair.  Pt does not have adequate trunk control or strength to attempt any other type of caregiver transfers.  Pt has a trained, 24 hour per day caregiver who can complete these hoyer transfers with the patient.  Mariam Dollar, PA-C

## 2019-12-30 NOTE — Progress Notes (Signed)
Speech Language Pathology Daily Session Note  Patient Details  Name: Stephen Wade MRN: [id number] Date of Birth: 07-06-1951  Today's Date: 12/30/2019 SLP Individual Time: 1430-1458 SLP Individual Time Calculation (min): 28 min  Short Term Goals: Week 4: SLP Short Term Goal 1 (Week 4): STG=LTG due to remaining LOS  Skilled Therapeutic Interventions: Pt was seen for skilled ST targeting education with pt and his wife. Upon arrival pt and his wife reported they wish to d/c home on Saturday 01/01/20. Provided Min A question cues, pt able to identify "aspirate" and "pneumonia" as potential consequences of drinking thin liquids at home, as opposed to following directions for nectar thick liquids. Pt reported he is currently not drinking the nectar thick liquids brought to his room and mostly gets liquid intake from requesting water via the free water protocol currently. He stated he is very unlikely to follow recommendations for thickened liquids upon return home. Therefore, SLP reinforced importance of oral hygiene and pillars of aspiration pneumonia. Pt and wife verbally acknowledged the risks of drinking thin liquids and need to perform oral care prior to PO intake. SLP and wife also discussed appropriate solid food items in compliance with Dys 3 mechanical soft recommendations, as well as crushing pills and adding honey for better flavor. SLP continues to recommend follow up therapy primarily to address dysphagia, but he would also benefit from continued interventions targeting short term memory and speech intelligibility (although greatly improved since admission). Pt refused any oral care of H2O from SLP. Pt left laying in bed with wife still at bedside. Continue per current plan of care.       Pain Pain Assessment Pain Scale: 0-10 Pain Score: 0-No pain  Therapy/Group: Individual Therapy  Stephen Wade 12/30/2019, 3:13 PM

## 2019-12-30 NOTE — Progress Notes (Signed)
Pt slept throughout night without issue, took all meds

## 2019-12-30 NOTE — Progress Notes (Signed)
PT refused AM meds, baclofen, pro-stat, miralax, and eye gtts. RN attempted encouragement multiple times. Pt has been refusing meds from previous days. Pt states "I'm done". MD aware of pt refusal. Will cont to monitor.   Ross Ludwig, RN

## 2019-12-30 NOTE — Plan of Care (Signed)
  Problem: Consults Goal: RH GENERAL PATIENT EDUCATION Description: See Patient Education module for education specifics. Outcome: Progressing   Problem: RH BLADDER ELIMINATION Goal: RH STG MANAGE BLADDER WITH ASSISTANCE Description: STG Manage Bladder With max Assistance Outcome: Progressing   Problem: RH SKIN INTEGRITY Goal: RH STG SKIN FREE OF INFECTION/BREAKDOWN Description: Min assist Outcome: Progressing Goal: RH STG MAINTAIN SKIN INTEGRITY WITH ASSISTANCE Description: STG Maintain Skin Integrity With Min Assistance. Outcome: Progressing   Problem: RH SAFETY Goal: RH STG ADHERE TO SAFETY PRECAUTIONS W/ASSISTANCE/DEVICE Description: STG Adhere to Safety Precautions With Min Assistance/Device. Outcome: Progressing Goal: RH STG DECREASED RISK OF FALL WITH ASSISTANCE Description: STG Decreased Risk of Fall With [employer]. Outcome: Progressing   Problem: RH BOWEL ELIMINATION Goal: RH STG MANAGE BOWEL WITH ASSISTANCE Description: STG Manage Bowel with Min Assistance. Outcome: Not Progressing Goal: RH STG MANAGE BOWEL W/MEDICATION W/ASSISTANCE Description: STG Manage Bowel with Medication with Min Assistance. Outcome: Not Progressing  Pt refused morning miralax. MD aware.

## 2019-12-30 NOTE — Progress Notes (Addendum)
[place] PHYSICAL MEDICINE & REHABILITATION PROGRESS NOTE  Subjective/Complaints: Patient seen sitting up in bed this morning.  He states he slept well overnight.  He is appreciative of the possibility of being discharged tomorrow.  Discussed with palliative care as well.  ROS: Denies CP, SOB, N/V/D  Objective: Vital Signs: Blood pressure 122/69, pulse (!) 108, temperature 97.7 F (36.5 C), resp. rate 20, height 6' (1.829 m), weight 88.5 kg, SpO2 95 %. No results found. No results for input(s): WBC, HGB, HCT, PLT in the last 72 hours. No results for input(s): NA, K, CL, CO2, GLUCOSE, BUN, CREATININE, CALCIUM in the last 72 hours.  Physical Exam: BP 122/69 (BP Location: Right Arm)   Pulse (!) 108   Temp 97.7 F (36.5 C)   Resp 20   Ht 6' (1.829 m)   Wt 88.5 kg   SpO2 95%   BMI 26.46 kg/m   Constitutional: No distress . Vital signs reviewed. HENT: Normocephalic.  Atraumatic. Eyes: EOMI. No discharge. Cardiovascular: No JVD. Respiratory: Normal effort.  No stridor. GI: Non-distended. Skin: Warm and dry.  Intact. Psych: Flat. Musc: Lower extremity edema. GU: + Foley. Neuro: Alert Dysarthria, stable Motor:  Right lower extremity: 1/5 proximal distal, stable Left lower extremity: Hip flexion, knee extension 1+/5, ankle dorsiflexion 1+/5, unchanged Extensor tone b/l LE R>L, unchanged  Assessment/Plan: 1. Functional deficits secondary to debility which require 3+ hours per day of interdisciplinary therapy in a comprehensive inpatient rehab setting.  Physiatrist is providing close team supervision and 24 hour management of active medical problems listed below.  Physiatrist and rehab team continue to assess barriers to discharge/monitor patient progress toward functional and medical goals  Care Tool:  Bathing    Body parts bathed by patient: Right arm, Left arm, Chest, Abdomen, Front perineal area, Right upper leg, Left upper leg, Face   Body parts bathed by helper:  Buttocks, Right lower leg, Left lower leg     Bathing assist Assist Level: Moderate Assistance - Patient 50 - 74%     Upper Body Dressing/Undressing Upper body dressing   What is the patient wearing?: Pull over shirt    Upper body assist Assist Level: Moderate Assistance - Patient 50 - 74%    Lower Body Dressing/Undressing Lower body dressing      What is the patient wearing?: Pants, Incontinence brief     Lower body assist Assist for lower body dressing: 2 Helpers     Toileting Toileting    Toileting assist Assist for toileting: Dependent - Patient 0%     Transfers Chair/bed transfer  Transfers assist  Chair/bed transfer activity did not occur: Safety/medical concerns  Chair/bed transfer assist level: 2 Helpers     Locomotion Ambulation   Ambulation assist   Ambulation activity did not occur: Safety/medical concerns          Walk 10 feet activity   Assist  Walk 10 feet activity did not occur: Safety/medical concerns        Walk 50 feet activity   Assist Walk 50 feet with 2 turns activity did not occur: Safety/medical concerns         Walk 150 feet activity   Assist Walk 150 feet activity did not occur: Safety/medical concerns         Walk 10 feet on uneven surface  activity   Assist Walk 10 feet on uneven surfaces activity did not occur: Safety/medical concerns         Wheelchair  Assist Will patient use wheelchair at discharge?: Yes Type of Wheelchair: Manual    Wheelchair assist level: Supervision/Verbal cueing Max wheelchair distance: 106ft    Wheelchair 50 feet with 2 turns activity    Assist        Assist Level: Supervision/Verbal cueing   Wheelchair 150 feet activity     Assist Wheelchair 150 feet activity did not occur: Safety/medical concerns          Medical Problem List and Plan: 1.  Decreased functional mobility secondary to acute respiratory distress with hypoxic respiratory  failure secondary to multi focal pneumonia in the setting of MS.    Follow-up chest x-ray 12/07/2019 bibasilar pulmonary infiltrates improved from prior exams, repeat chest x-ray on 12/21 personally reviewed showing?  Persistence of infiltrate- monitor at present per Pulm.   Patient completed 10-day course of Augmentin on 12/17  Completed 5-day course of IV Solu-Medrol.    Continue CIR   Palliative care consulted, appreciate recs-DNR/DNI.  No changes in current management while in rehab.  Discussed with palliative care-appreciate recs, will plan for discharge home tomorrow versus the day after  Patient continues to refuse medications and therapies 2.  Antithrombotics: DVT/anticoagulation: Subcutaneous Lovenox.  CT angiogram of chest negative for pulmonary emboli  CBC within normal limits on 1/4             Antiplatelet therapy: N/A 3. Pain Management/spasticity: Continue meds, see #12 4. Mood: Provide emotional support  Appreciate neuropsych eval  Fluoxetine started on 12/18, increased on 12/26   Continues to be depressed, not completely unrealistic    Continues to refuse meds             -antipsychotic agents: N/A 5. Neuropsych: This patient is not capable of making decisions on his own behalf. 6. Skin/Wound Care: Routine skin checks/pressure injury to buttocks.  Follow-up skin care checks 7. Fluids/Electrolytes/Nutrition: Routine in and outs 8.  Atrial fibrillation with RVR.  IV Cardizem discontinued due to hypotension.  No current plan for anticoagulation  Monitor with increased mobility. 9.  Neurogenic bowel bladder with recurrent UTIs.  Indwelling Foley catheter tube to remain in place at the recommendations of urology services per plan outpatient evaluation for possible need for suprapubic tube  UA on 12/21 suggestive of infection, urine culture showing Pseudomonas, pansensitive  Ciprofloxacin course completed on 1/2  Continue foley  10.  Dysphagia.  Advanced to Dysphagia # 3,  nectar-patient insistent on wanting to drink water  Continue to advance diet as tolerated. 11.AKI: Resolved  Creatinine within normal limits on 1/4  D/ced IVF 12. Spasticity in setting of MS  Baclofen increased to home dose of 10 mg 3 times daily on 12/17, decreased back to 5 mg 3 times daily on 12/19 due to lethargy, increased back to 10 mg on 12/30.    Monitor for lethargy  Patient does not want any further aggressive management at present, refusing medications at present 13. Steroid induced hyperglycemia: ?Resolved  Cont to monitor 14.  Hypernatremia: Resolved  Sodium 142 on 1/1  Cont to monitor 15. Hypoalbuminemia  Supplement initiated on 12/16 16.  Transaminitis  ALT elevated, but improving on 1/4  Continue to monitor 17. ABLA: Resolved  Hb 13.9 on 12/21  Cont to monitor 18.  Neurogenic bowel  Bowel meds increased on 12/27  Stable  LOS: 23 days A FACE TO FACE EVALUATION WAS PERFORMED  Tawnee Clegg Lorie Phenix 12/30/2019, 8:12 AM

## 2019-12-30 NOTE — Progress Notes (Signed)
Physical Therapy Session Note  Patient Details  Name: Stephen Wade MRN: [id number] Date of Birth: 05-23-1951  Today's Date: 12/30/2019 PT Individual Time: 1300-1400 PT Individual Time Calculation (min): 60 min   Short Term Goals: Week 3:  PT Short Term Goal 1 (Week 3): Pt will perform bed mobility with mod assist PT Short Term Goal 1 - Progress (Week 3): Not met PT Short Term Goal 2 (Week 3): Pt will transfer to [facility] with mod assist PT Short Term Goal 2 - Progress (Week 3): Not met PT Short Term Goal 3 (Week 3): Pt will perform sit<>stand with mod assist and BUE support on rail or in stedy PT Short Term Goal 3 - Progress (Week 3): Not met PT Short Term Goal 4 (Week 3): Pt will propel WC >139f with supevision assist PT Short Term Goal 4 - Progress (Week 3): Not met Week 4:  PT Short Term Goal 1 (Week 4): STG= LTG due to LOS  Skilled Therapeutic Interventions/Progress Updates:   Received pt supine in bed, pt agreeable to therapy, and denied any pain during session. Wife present for family education training. Session focused on functional mobility, Hoyer lift transfers, and family education regarding discharge planning. Discussion had regarding wife hiring more care to assist with at home. Wife educated on need for second person to assist with HHarrel Lemontransfers and mobility. Wife was educated on use of bedpan as safest way for pt to toilet due to poor trunk control, RLE tone, and bilateral LE weakness. Therapist educated wife on donning U sling. Pt rolled to L and R with mod A and use of bedrails and required +2 assist to don U sling. Therapist transferred pt from bed<>TIS WC via [place] while educating wife on each step including appropriate sling set up, handing of lift, and set up to get pt into WC. Therapist educated wife on how to doff and don U sling with pt sitting in [place] Wife demonstrated teach back education by transferring pt from WC<>bed via HErskinewith +2 assist. Pt's wife required  verbal cues for steps and verbalized that she wasn't fully confident with HHarrel Lemonyet. Pt reported urge to have BM and required +2 assist to position bedpan with wife's assistance. Pt unsuccessful on bedpan and falling asleep after a few minutes. Noted Foley catheter came out at some point today, RN aware and attending to situation. Pt fatigued at end of session and refused to be repositioned in bed. Concluded session with pt supine in bed, needs within reach, bed alarm on, and RN aware of Foley coming out.   Therapy Documentation Precautions:  Precautions Precautions: Fall Precaution Comments: hx MS (R LE deficits > L LE, spastic diplegia) Restrictions Weight Bearing Restrictions: No   Therapy/Group: Individual Therapy AAlfonse AlpersPT, DPT   12/30/2019, 7:51 AM

## 2019-12-30 NOTE — Progress Notes (Signed)
RN returned from lunch, got report from CN and PT that pt foley was out. Went to assess pt and foley catheter was present but out with balloon disinflated. PA notified. Order to replace foley. Supplies ordered.  Ross Ludwig, RN

## 2019-12-30 NOTE — Progress Notes (Signed)
Recreational Therapy Discharge Summary Patient Details  Name: Stephen Wade MRN: [id number] Date of Birth: 1951/10/31 Today's Date: 12/30/2019  Long term goals set: 1  Long term goals met: 0  Comments on progress toward goals: Pt is being discharged from TR services due to pt and family's desire to go home.  Remainder of therapy sessions will be focused on family education.  Education provided to pt and wife during TR evaluation on importance of leisure/life satisfiers in routine. Also discussed activity analysis with potential modifications.   Both stated understanding.  TR goal not met.  Pt required mod assist for balance during TR tasks when OOB.   Reasons for discharge: discharge from hospital  Patient/family agrees with progress made and goals achieved: Yes  Tearra Ouk 12/30/2019, 2:19 PM

## 2019-12-30 NOTE — Progress Notes (Signed)
Occupational Therapy Session Note  Patient Details  Name: Stephen Wade MRN: [id number] Date of Birth: 01/06/1951  Today's Date: 12/30/2019 OT Individual Time: 1515-1600 OT Individual Time Calculation (min): 45 min    Short Term Goals: Week 1:  OT Short Term Goal 1 (Week 1): Pt will don shirt with mod A OT Short Term Goal 1 - Progress (Week 1): Not met OT Short Term Goal 2 (Week 1): Pt will maintain EOB sitting balance with mod A OT Short Term Goal 2 - Progress (Week 1): Not met OT Short Term Goal 3 (Week 1): Pt will complete LB dressing with max A OT Short Term Goal 3 - Progress (Week 1): Not met Week 2:  OT Short Term Goal 1 (Week 2): Pt will don shirt with mod A OT Short Term Goal 1 - Progress (Week 2): Met OT Short Term Goal 2 (Week 2): Pt will maintain EOB sitting balance with mod A OT Short Term Goal 2 - Progress (Week 2): Met OT Short Term Goal 3 (Week 2): Pt will complete LB dressing with max A OT Short Term Goal 3 - Progress (Week 2): Progressing toward goal OT Short Term Goal 4 (Week 2): Pt will complete transfer bed > roll in shower chair with max assist of one caregiver OT Short Term Goal 4 - Progress (Week 2): Progressing toward goal Week 3:  OT Short Term Goal 1 (Week 3): Pt will complete LB dressing with max A OT Short Term Goal 1 - Progress (Week 3): Not met OT Short Term Goal 2 (Week 3): Pt will complete transfer bed > roll in shower chair/wheelchair with max assist of one caregiver OT Short Term Goal 2 - Progress (Week 3): Not met OT Short Term Goal 3 (Week 3): Pt will complete UB dressing with min A OT Short Term Goal 3 - Progress (Week 3): Not met OT Short Term Goal 4 (Week 3): Pt will maintain dynamic sitting balance with Mod assist during self-care tasks OT Short Term Goal 4 - Progress (Week 3): Not met  Skilled Therapeutic Interventions/Progress Updates:    Wife present for family education,. Despite encouragement pt declined to participate in active therapy/  family education. Pt reported they would figure it out at home. Wife reports CNA that was assisting prior this hospitalization was performing total A transfers; lifting the patient up to transfer him into the shower. Recommended that pt not be lifted without lift and safest options for transferring is a slide board  With 2 helpers or hoyer lift with 2 people. Recommended they do not perform shower transfer the first few days and there is a concern for his sitting balance and requiring A to maintain sitting balance. Pt declined to come to EOB for wife to assess and witness. Recommend toileting and use of bed pan due to decr sitting balance and urgency. Took wife down to apartment and demonstrated slide board transfers w/c <>recliner with recommendations for positioning of board and helpers and how to assist pt. Instructions for hoyer lift printed out for her. Hoyer lift and sling to be delivered today- she went home to meet the delivery. Discussed with wife and pt that tomorrow goal was to try again a hoyer lift and for the pt to perform a slide board transfer with one of us for her to help educate caregiver to maintain safety.  Therapy Documentation Precautions:  Precautions Precautions: Fall Precaution Comments: hx MS (R LE deficits > L LE, spastic diplegia) Restrictions Weight   Bearing Restrictions: No General:   Vital Signs: Therapy Vitals Temp: 98 F (36.7 C) Pulse Rate: 91 Resp: 16 BP: 126/80 Patient Position (if appropriate): Lying Oxygen Therapy SpO2: 95 % O2 Device: Room Air Pain:  no c/o pain but declined to participate in any activity   Therapy/Group: Individual Therapy  Smith, Jennifer Lynsey 12/30/2019, 7:30 PM  

## 2019-12-31 ENCOUNTER — Inpatient Hospital Stay (HOSPITAL_COMMUNITY): Payer: Medicare Other

## 2019-12-31 ENCOUNTER — Ambulatory Visit (HOSPITAL_COMMUNITY): Payer: Medicare Other

## 2019-12-31 ENCOUNTER — Encounter (HOSPITAL_COMMUNITY): Payer: Medicare Other | Admitting: Occupational Therapy

## 2019-12-31 ENCOUNTER — Inpatient Hospital Stay (HOSPITAL_COMMUNITY): Payer: Medicare Other | Admitting: Speech Pathology

## 2019-12-31 DIAGNOSIS — Z91199 Patient's noncompliance with other medical treatment and regimen due to unspecified reason: Secondary | ICD-10-CM

## 2019-12-31 DIAGNOSIS — Z9119 Patient's noncompliance with other medical treatment and regimen: Secondary | ICD-10-CM

## 2019-12-31 NOTE — Progress Notes (Signed)
Speech Language Pathology Daily Session Note  Patient Details  Name: Stephen Wade MRN: [id number] Date of Birth: May 29, 1951  Today's Date: 12/31/2019 SLP Individual Time: 1350-1413 SLP Individual Time Calculation (min): 23 min  Short Term Goals: Week 4: SLP Short Term Goal 1 (Week 4): STG=LTG due to remaining LOS  Skilled Therapeutic Interventions: Pt was seen for skilled ST targeting pt and family education regarding dysphagia and cognitive impairments with an emphasis on short term recall. SLP provided verbal explanation and handouts regarding pt's current diet (dys 3/nectar/water protocol). SLP emphasized importance of oral care prior to PO intake. Verbal explanation and handout provided for short term memory strategies as well with specific examples. Reinforced recommendations for 24/7 supervision at home. Pt refused any PO intake. Pt left sitting in wheelchair with needs within reach and wife still present.        Pain Pain Assessment Pain Scale: Faces Faces Pain Scale: No hurt  Therapy/Group: Individual Therapy  Little Ishikawa 12/31/2019, 3:15 PM

## 2019-12-31 NOTE — Progress Notes (Signed)
Social Work Patient ID: Stephen Wade, male   DOB: 1951-11-11, 69 y.o.   MRN: [id number]  Have spoken with pt and wife today who are agreed to target d/c for Monday to allow for completion of family ed and home environment set up.  Wife will have additional caregiver support on that day as well.  Continue to follow.  Harlon Kutner, LCSW

## 2019-12-31 NOTE — Progress Notes (Signed)
Speech Language Pathology Discharge Summary  Patient Details  Name: Stephen Wade MRN: [id number] Date of Birth: 08/30/1951   Patient has met 8 of 8 long term goals.  Patient to discharge at overall Min;Supervision level.  Reasons goals not met: n/a   Clinical Impression/Discharge Summary:   Pt made functional gains and met 8 out of 8 long term goals this admission. Pt currently requires Min assist for basic tasks and due to cognitive impairments and dysphagia and will require 24/7 supervision. Pt is consuming Dysphagia 3 diet with nectar liquids in addition to free water protocol. Pt has voiced that he is very unlikely to follow diet recommendations, therefore extensive education regarding aspiration risk, aspiration pneumonia, and mportance of oral hygiene has been emphasized over last 3 visits. Wife and pt both verbally acknowledge aspiration risk with thin liquids. Pt has demonstrated improved oral phase swallow function, basic problem solving and daily recall, and speech intelligibility. However, given dysphagia, short term memory deficits, and dysarthria still present, recommend pt continue to receive skilled ST services upon discharge. Pt and family education is complete at this time.    Care Partner:  Caregiver Able to Provide Assistance: Yes  Type of Caregiver Assistance: Cognitive  Recommendation:  24 hour supervision/assistance;Home Health SLP  Rationale for SLP Follow Up: Maximize cognitive function and independence;Reduce caregiver burden;Maximize swallowing safety;Maximize functional communication   Equipment: none   Reasons for discharge: Discharged from hospital   Patient/Family Agrees with Progress Made and Goals Achieved: Yes    Arbutus Leas 12/31/2019, 7:32 AM

## 2019-12-31 NOTE — Progress Notes (Signed)
Occupational Therapy Discharge Summary  Patient Details  Name: Stephen Wade MRN: [id number] Date of Birth: 06-15-1951  Patient has met 4 of 6 long term goals due to improved activity tolerance, improved balance, ability to compensate for deficits and improved awareness.  Patient to discharge at overall Mod-Max assist for self-care tasks at bed level for LB and seated in supported position for UB level.  Patient's care partner requires assistance to provide the necessary physical and cognitive assistance at discharge.  Patient's wife has been present for hands on family education, however requires assistance to provide the level of physical assistance that pt requires.  This therapist has recommended that family and caregiver perform self-care tasks and toileting at bed level and have trained wife in use of hoyer.  Due to pt impulsivity and tone, not recommending use of slide board at this time as he requires skilled +2 assistance.  Reasons goals not met: Continues to require skilled +2 for transfers to toilet and +2 for LB dressing at bed level.  Recommendation:  Patient will benefit from ongoing skilled OT services in home health setting to continue to advance functional skills in the area of BADL and Reduce care partner burden.  Equipment: hoyer lift  Reasons for discharge: treatment goals met and discharge from hospital  Patient/family agrees with progress made and goals achieved: Yes  OT Discharge Precautions/Restrictions  Precautions Precautions: Fall Precaution Comments: hx MS (R LE deficits > L LE, spastic diplegia) Restrictions Weight Bearing Restrictions: No ADL ADL Eating: Minimal assistance Where Assessed-Eating: Bed level Grooming: Supervision/safety, Setup Where Assessed-Grooming: Sitting at sink Upper Body Bathing: Minimal assistance Where Assessed-Upper Body Bathing: Shower, Sitting at sink Lower Body Bathing: Moderate assistance Where Assessed-Lower Body Bathing:  Bed level, Shower(can complete at shower level with roll-in shower chair, but this is not recommended at home due to home setup) Upper Body Dressing: Moderate assistance Where Assessed-Upper Body Dressing: Sitting at sink Lower Body Dressing: Dependent Where Assessed-Lower Body Dressing: Bed level Toileting: Dependent Where Assessed-Toileting: Bed level Toilet Transfer: Dependent(hoyer vs +2 slide board) Toilet Transfer Method: Unable to assess Toilet Transfer Equipment: Drop arm bedside commode(slide board) Tub/Shower Transfer: Unable to assess [employer] Transfer: Maximal assistance(+2 to roll in shower chair) Youth worker: (roll-in shower chair) Vision Baseline Vision/History: Wears glasses Wears Glasses: At all times Patient Visual Report: Diplopia("comes and goes") Vision Assessment?: Yes Ocular Range of Motion: Within Functional Limits Alignment/Gaze Preference: Within Defined Limits Tracking/Visual Pursuits: Able to track stimulus in all quads without difficulty Saccades: Within functional limits Perception  Perception: Impaired [place]: per pt report Praxis Praxis: Intact Cognition Overall Cognitive Status: Impaired/Different from baseline Arousal/Alertness: Awake/alert(fluctuates between alert and lethargic) Orientation Level: Oriented X4 Attention: Sustained Sustained Attention: Appears intact Memory: Impaired Memory Impairment: Decreased short term memory;Decreased recall of new information Decreased Short Term Memory: Verbal basic;Functional basic Awareness: Appears intact Problem Solving: Impaired Problem Solving Impairment: Verbal complex;Functional complex Executive Function: Decision Making Decision Making: Impaired Decision Making Impairment: Functional complex Safety/Judgment: Impaired Sensation Sensation Light Touch: Impaired by gross assessment Light Touch Impaired Details: Impaired LLE Additional Comments: Decreased LLE>RLE  below ankles. Absent light touch bilateral great toe Coordination Gross Motor Movements are Fluid and Coordinated: No Fine Motor Movements are Fluid and Coordinated: No Coordination and Movement Description: Grossly uncoordinated, ataxia, generalized weakness Finger Nose Finger Test: Dysmetria RUE Heel Shin Test: Unable to perform Motor  Motor Motor: Ataxia;Abnormal tone;Abnormal postural alignment and control Motor - Skilled Clinical Observations: Grossly uncoordinated due to ataxia,  LE weakness, and Hx of MS Mobility  Bed Mobility Bed Mobility: Rolling Right;Rolling Left;Sit to Supine;Supine to Sit Rolling Right: Moderate Assistance - Patient 50-74%(bedrails) Rolling Left: Moderate Assistance - Patient 50-74%(bedrails) Supine to Sit: 2 Helpers Sit to Supine: 2 Helpers  Trunk/Postural Assessment  Cervical Assessment Cervical Assessment: Exceptions to WFL(forward head) Thoracic Assessment Thoracic Assessment: Exceptions to WFL(rounded shoulders) Lumbar Assessment Lumbar Assessment: Exceptions to WFL(posterior pelvic tilt) Postural Control Postural Control: Deficits on evaluation Trunk Control: Able to maintain sitting balance EOB with bilateral UE support 75% of time and with UEs in lap 25% of time  Balance Balance Balance Assessed: Yes Static Sitting Balance Static Sitting - Balance Support: Bilateral upper extremity supported Static Sitting - Level of Assistance: 4: Min assist Dynamic Sitting Balance Dynamic Sitting - Balance Support: Bilateral upper extremity supported Dynamic Sitting - Level of Assistance: 3: Mod assist Sitting balance - Comments: Initally requires mod A with bilateral UEs to maintain static sitting balance, however able to maintain sitting balance min A with bilateral UE support 75% of time and without UE support 25% of time min A Extremity/Trunk Assessment RUE Assessment RUE Assessment: Exceptions to [facility] General Strength Comments: Full AROM, generalized  weakness, 3+/5 LUE Assessment LUE Assessment: Exceptions to [facility] General Strength Comments: Full AROM, generalized weakness, 3+/5   Stephen Wade 12/31/2019, 1:01 PM

## 2019-12-31 NOTE — Progress Notes (Signed)
[place] PHYSICAL MEDICINE & REHABILITATION PROGRESS NOTE  Subjective/Complaints: Patient seen laying in bed this AM.  He states he slept well overnight. He states he is looking forward to going home tomorrow.  He is appreciative of his care.  Discussed with therapies transfers during family education.    ROS: Denies CP, SOB, N/V/D  Objective: Vital Signs: Blood pressure 136/80, pulse 95, temperature 97.9 F (36.6 C), temperature source Oral, resp. rate 18, height 6' (1.829 m), weight 88.5 kg, SpO2 93 %. No results found. No results for input(s): WBC, HGB, HCT, PLT in the last 72 hours. No results for input(s): NA, K, CL, CO2, GLUCOSE, BUN, CREATININE, CALCIUM in the last 72 hours.  Physical Exam: BP 136/80 (BP Location: Right Arm)   Pulse 95   Temp 97.9 F (36.6 C) (Oral)   Resp 18   Ht 6' (1.829 m)   Wt 88.5 kg   SpO2 93%   BMI 26.46 kg/m   Constitutional: No distress . Vital signs reviewed. HENT: Normocephalic.  Atraumatic. Eyes: EOMI. No discharge. Cardiovascular: No JVD. Respiratory: Normal effort.  No stridor. GI: Non-distended. Skin: Warm and dry.  Intact. Psych: Flat Musc: Lower extremity edema. GU: + Foley. Neuro: Alert Dysarthria, unchanged Motor:  Right lower extremity: 1/5 proximal distal, stable Left lower extremity: Hip flexion, knee extension 1+/5, ankle dorsiflexion 1+/5, unchanged Extensor tone b/l LE R>L, stable  Assessment/Plan: 1. Functional deficits secondary to debility which require 3+ hours per day of interdisciplinary therapy in a comprehensive inpatient rehab setting.  Physiatrist is providing close team supervision and 24 hour management of active medical problems listed below.  Physiatrist and rehab team continue to assess barriers to discharge/monitor patient progress toward functional and medical goals  Care Tool:  Bathing    Body parts bathed by patient: Right arm, Left arm, Chest, Abdomen, Front perineal area, Right upper leg,  Left upper leg, Face   Body parts bathed by helper: Buttocks, Right lower leg, Left lower leg     Bathing assist Assist Level: Moderate Assistance - Patient 50 - 74%     Upper Body Dressing/Undressing Upper body dressing   What is the patient wearing?: Pull over shirt    Upper body assist Assist Level: Moderate Assistance - Patient 50 - 74%    Lower Body Dressing/Undressing Lower body dressing      What is the patient wearing?: Pants, Incontinence brief     Lower body assist Assist for lower body dressing: 2 Helpers     Toileting Toileting    Toileting assist Assist for toileting: Dependent - Patient 0%     Transfers Chair/bed transfer  Transfers assist  Chair/bed transfer activity did not occur: Safety/medical concerns  Chair/bed transfer assist level: Dependent - mechanical lift     Locomotion Ambulation   Ambulation assist   Ambulation activity did not occur: Safety/medical concerns          Walk 10 feet activity   Assist  Walk 10 feet activity did not occur: Safety/medical concerns        Walk 50 feet activity   Assist Walk 50 feet with 2 turns activity did not occur: Safety/medical concerns         Walk 150 feet activity   Assist Walk 150 feet activity did not occur: Safety/medical concerns         Walk 10 feet on uneven surface  activity   Assist Walk 10 feet on uneven surfaces activity did not occur: Safety/medical concerns  Wheelchair     Assist Will patient use wheelchair at discharge?: Yes Type of Wheelchair: Manual    Wheelchair assist level: Supervision/Verbal cueing Max wheelchair distance: 8ft    Wheelchair 50 feet with 2 turns activity    Assist        Assist Level: Supervision/Verbal cueing   Wheelchair 150 feet activity     Assist Wheelchair 150 feet activity did not occur: Safety/medical concerns          Medical Problem List and Plan: 1.  Decreased functional  mobility secondary to acute respiratory distress with hypoxic respiratory failure secondary to multi focal pneumonia in the setting of MS.    Follow-up chest x-ray 12/07/2019 bibasilar pulmonary infiltrates improved from prior exams, repeat chest x-ray on 12/21 personally reviewed showing?  Persistence of infiltrate- monitor at present per Pulm.   Patient completed 10-day course of Augmentin on 12/17  Completed 5-day course of IV Solu-Medrol.    Continue CIR-plan was for patient to DC tomorrow to home by ambulance, however family education did not go well as patient refused to participate in a [place] transfer.  Like to come in again today for education.  Depending on how patient/wife do patient may not be able to be discharged tomorrow.  Palliative care consulted, appreciate recs-DNR/DNI.  No changes in current management while in rehab.  Discussed with palliative care previously -appreciate recs.  Patient continues to refuse medications and therapies 2.  Antithrombotics: DVT/anticoagulation: Subcutaneous Lovenox.  CT angiogram of chest negative for pulmonary emboli  CBC within normal limits on 1/4             Antiplatelet therapy: N/A 3. Pain Management/spasticity: Continue meds, see #12 4. Mood: Provide emotional support  Appreciate neuropsych eval  Fluoxetine started on 12/18, increased on 12/26   Continues to be depressed    Continues to refuse meds             -antipsychotic agents: N/A 5. Neuropsych: This patient is not capable of making decisions on his own behalf. 6. Skin/Wound Care: Routine skin checks/pressure injury to buttocks.  Follow-up skin care checks 7. Fluids/Electrolytes/Nutrition: Routine in and outs 8.  Atrial fibrillation with RVR.  IV Cardizem discontinued due to hypotension.  No current plan for anticoagulation  Monitor with increased mobility. 9.  Neurogenic bowel bladder with recurrent UTIs.  Indwelling Foley catheter tube to remain in place at the recommendations of  urology services per plan outpatient evaluation for possible need for suprapubic tube  UA on 12/21 suggestive of infection, urine culture showing Pseudomonas, pansensitive  Ciprofloxacin course completed on 1/2  Continue foley  10.  Dysphagia.  Advanced to Dysphagia # 3, nectars -patient insistent on wanting to drink water  Continue to advance diet as tolerated. 11.AKI: Resolved  Creatinine within normal limits on 1/4  D/ced IVF 12. Spasticity in setting of MS  Baclofen increased to home dose of 10 mg 3 times daily on 12/17, decreased back to 5 mg 3 times daily on 12/19 due to lethargy, increased back to 10 mg on 12/30.    Monitor for lethargy  Patient does not want any further aggressive management at present, refusing medications at present 13. Steroid induced hyperglycemia: ?Resolved  Cont to monitor 14.  Hypernatremia: Resolved  Sodium 142 on 1/1  Cont to monitor 15. Hypoalbuminemia  Supplement initiated on 12/16 16.  Transaminitis  ALT elevated, but improving on 1/4  Continue to monitor 17. ABLA: Resolved  Hb 13.9 on 12/21  Cont  to monitor 18.  Neurogenic bowel  Bowel meds increased on 12/27  Overall stable  LOS: 24 days A FACE TO FACE EVALUATION WAS PERFORMED  Stephen Wade Stephen Wade 12/31/2019, 9:18 AM

## 2019-12-31 NOTE — Progress Notes (Signed)
Occupational Therapy Session Note  Patient Details  Name: Stephen Wade MRN: [id number] Date of Birth: Nov 04, 1951  Today's Date: 12/31/2019 OT Individual Time: 7062-3762 OT Individual Time Calculation (min): 40 min    Short Term Goals: Week 4:  OT Short Term Goal 1 (Week 4): STG = LTG due to remaining LOS  Skilled Therapeutic Interventions/Progress Updates:    Treatment session with focus on family education with wife.  Pt received upright in w/c reporting need to get back to bed.  Discussed plan to complete transfer with slideboard during this session.  While therapist explaining positioning of w/c and setup of slide board, pt becoming increasingly impatient/hurried with therapist.  Completed slide board transfer max A +2 with therapist setting up slide board and verbalizing positioning of body (pt and therapist).  Halfway through transfer, pt stopped participating requiring total assist to bring BLE in to bed and then position appropriately in bed.    Engaged in lengthy discussion with wife on safety for bathing, dressing, and toileting.  Strongly recommending LB bathing, dressing, and toileting being completed at bed level due to pt decreased trunk control, impulsivity, and fluctuating levels of participation/motivation.  Discussed using hospital bed to her advantage and use of hoyer for transfers due to pt decreased participation and safety with transfer (especially this session).  Pt's wife appearing overwhelmed, but stating that she feels she will be able to do this with the caregiver.  Strongly encouraged she have caregiver more hours during the day as he will require +2 for all transfers (lift or slideboard).   Therapy Documentation Precautions:  Precautions Precautions: Fall Precaution Comments: hx MS (R LE deficits > L LE, spastic diplegia) Restrictions Weight Bearing Restrictions: No General:   Vital Signs: Therapy Vitals Temp: 97.6 F (36.4 C) Pulse Rate: 83 Resp: 18 BP:  122/74 Patient Position (if appropriate): Lying Oxygen Therapy SpO2: 98 % O2 Device: Room Air Pain:  Pt with no c/o pain   Therapy/Group: Individual Therapy  Rosalio Loud 12/31/2019, 3:07 PM

## 2019-12-31 NOTE — Progress Notes (Signed)
Physical Therapy Session Note  Patient Details  Name: Stephen Wade MRN: [id number] Date of Birth: [birth year]  Today's Date: 12/31/2019 PT Individual Time: 0900-0929 and 1300-1350 PT Individual Time Calculation (min): 29 min and 50 min  Short Term Goals: Week 3:  PT Short Term Goal 1 (Week 3): Pt will perform bed mobility with mod assist PT Short Term Goal 1 - Progress (Week 3): Not met PT Short Term Goal 2 (Week 3): Pt will transfer to [facility] with mod assist PT Short Term Goal 2 - Progress (Week 3): Not met PT Short Term Goal 3 (Week 3): Pt will perform sit<>stand with mod assist and BUE support on rail or in stedy PT Short Term Goal 3 - Progress (Week 3): Not met PT Short Term Goal 4 (Week 3): Pt will propel WC >14f with supevision assist PT Short Term Goal 4 - Progress (Week 3): Not met Week 4:  PT Short Term Goal 1 (Week 4): STG= LTG due to LOS  Skilled Therapeutic Interventions/Progress Updates:    Treatment Session 1: [id number] min Received pt supine in bed, pt agreeable to therapy, and denied any pain during session. Pt emotional throughout session stating "I finally realized why I've been here all this time". Therapist provided emotional support and encouragement. Pt verbalized excitement about returning home and therapist educated pt on transfer techniques at home for energy conservation; pt in agreement. Therapist performed bilateral passive ROM into hip flexion, knee flexion, and hip abduction/adduction. Pt able to actively perform bilateral single knee to chest stretch and hold for approximately 20 seconds. Pt with dry skin around ankles/feet; therapist applied body lotion for skin relief. Concluded session with pt supine in bed, needs within reach, and bed alarm on.  Treatment Session 2: 1300-1350 50 min Received pt supine in bed, pt agreeable to therapy, and denied any pain during session. Wife present for family education training. Session focused on functional mobility, HLibrarian, academiceducation, and education regarding discharge planning home. Pt rolled to L and R with mod A and use of bedrail to position U sling. Therapist educated wife on proper placement and positioning of sling. Wife practiced [facility] pt from bed<>TIS WC +2 assist with verbal cues for use of lift and proper positioning of pt. Upon positioning pt in TIS WC pt's hips slid forward and pt required +2 assist to scoot hips back in chair. Pt with increased verbal frustration and agitation stating "get me back to bed". Therapist educated pt on remaining OOB for wife to continue practicing with HHarrel Lemon Required +2 assist to doff sling from pt while seated in TIS. Pt continued to slide forward in TIS due to poor tilt feature of specific chair. Therapist applied leg rest to better position pt in chair. Pt with increased agitation stating he wanted to shower and get back to bed. Therapist encouraged pt to sit upright until upcoming OT session for energy conservation with transfers. Pt finally agreed after wife convinced him. Con[facility], and wife present in room.   Therapy Documentation Precautions:  Precautions Precautions: Fall Precaution Comments: hx MS (R LE deficits > L LE, spastic diplegia) Restrictions Weight Bearing Restrictions: No   Therapy/Group: Individual Therapy AAlfonse AlpersPT, DPT   12/31/2019, 9:59 AM

## 2019-12-31 NOTE — Progress Notes (Signed)
Physical Therapy Discharge Summary  Patient Details  Name: Stephen Wade MRN: [id number] Date of Birth: 1951-07-01  Patient has met 2 of 5 long term goals due to improved postural control and improved awareness. Patient to discharge at a wheelchair level Total Assist. Patient's care partner requires assistance to provide the necessary physical assistance at discharge. Wife present for family education training with [facility] lift, pt able to provide minimal assist verbally with transfer setup. Wife with plans to hire more assistance at home upon discharge.   Reasons goals not met: Discharge plan changed due to lack of progress towards goals and pt requesting to return home. Pt has fluctuated with motivation to participate in therapy. Pt continues to state "i'm done". Pt initially with +2 assistance for bed mobility and transfers and continues to require +2 assistance upon discharge. Pt requires continuous convincing to participate in OOB mobility. Pt and wife extensively educated on importance of OOB mobility and movement in general. Wife and pt verbalized understanding, however, pt demonstrates poor carry over with education provided by therapy. Wife and pt educated on using Hoyer lift as primary method for transfers, as slideboard transfers are unsafe for wife to perform. Wife and pt verbalized understanding.   Recommendation:  Patient will benefit from ongoing skilled PT services in home health setting to continue to advance safe functional mobility, address ongoing impairments in bed mobility, dynamic sitting balance, transfers, UE/LE strength, and to minimize fall risk.  Equipment: Harrel Lemon and U sling  Reasons for discharge: lack of progress toward goals and discharge from hospital  Patient/family agrees with progress made and goals achieved: Yes  PT Discharge Precautions/Restrictions Precautions Precautions: Fall Precaution Comments: hx MS (R LE deficits > L LE, spastic  diplegia) Restrictions Weight Bearing Restrictions: No Cognition Overall Cognitive Status: Impaired/Different from baseline Arousal/Alertness: Awake/alert Orientation Level: Oriented X4 Memory: Impaired Awareness: Appears intact Problem Solving: Impaired Decision Making: Impaired Safety/Judgment: Impaired Sensation Sensation Light Touch: Impaired by gross assessment Additional Comments: Decreased LLE>RLE below ankles. Absent light touch bilateral great toe Coordination Gross Motor Movements are Fluid and Coordinated: No Fine Motor Movements are Fluid and Coordinated: No Coordination and Movement Description: Grossly uncoordinated, ataxia, generalized weakness Finger Nose Finger Test: Dysmetria RUE Heel Shin Test: Unable to perform Motor  Motor Motor: Ataxia;Abnormal tone;Abnormal postural alignment and control Motor - Skilled Clinical Observations: Grossly uncoordinated due to ataxia, LE weakness, and Hx of MS  Mobility Bed Mobility Bed Mobility: Rolling Right;Rolling Left;Sit to Supine;Supine to Sit Rolling Right: Moderate Assistance - Patient 50-74%(bedrails) Rolling Left: Moderate Assistance - Patient 50-74%(bedrails) Supine to Sit: 2 Helpers Sit to Supine: 2 Helpers Transfers Transfers: Lateral/Scoot Transfers Lateral/Scoot Transfers: 2 Press photographer (Assistive device): Other (Comment)(slideboard) Trunk/Postural Assessment  Cervical Assessment Cervical Assessment: Exceptions to WFL(forward head) Thoracic Assessment Thoracic Assessment: Exceptions to WFL(rounded shoulders) Lumbar Assessment Lumbar Assessment: Exceptions to WFL(posterior pelvic tilt) Postural Control Postural Control: Deficits on evaluation Trunk Control: Able to maintain sitting balance EOB with bilateral UE support 75% of time and with UEs in lap 25% of time  Balance Balance Balance Assessed: Yes Static Sitting Balance Static Sitting - Balance Support: Bilateral upper extremity  supported Static Sitting - Level of Assistance: 4: Min assist Dynamic Sitting Balance Dynamic Sitting - Balance Support: Bilateral upper extremity supported Dynamic Sitting - Level of Assistance: 3: Mod assist Sitting balance - Comments: Initally requires mod A with bilateral UEs to maintain static sitting balance, however able to maintain sitting balance min A with bilateral UE support 75% of  time and without UE support 25% of time min A Extremity Assessment  RLE Assessment RLE Assessment: Exceptions to [facility] Strength Comments: Increased tone in RLE limiting ROM. Ankle PF/DF 3+/5 and knee extension 2+/5 otherwise unremarkable LLE Assessment LLE Assessment: Exceptions to [facility] Strength Comments: Ankle PF/DF 3+/5 and knee extension 3/5 otherwise unremarkable   Alfonse Alpers PT, DPT  12/31/2019, 7:50 AM

## 2019-12-31 NOTE — Progress Notes (Signed)
Provided oral care and offered water via water protocol.

## 2020-01-01 ENCOUNTER — Inpatient Hospital Stay (HOSPITAL_COMMUNITY): Payer: Medicare Other | Admitting: Occupational Therapy

## 2020-01-01 ENCOUNTER — Inpatient Hospital Stay (HOSPITAL_COMMUNITY): Payer: Medicare Other | Admitting: Physical Therapy

## 2020-01-01 NOTE — Progress Notes (Addendum)
[place] PHYSICAL MEDICINE & REHABILITATION PROGRESS NOTE  Subjective/Complaints:   Oriented x 3 , ataxic dysarthria, family training scheduled today, per pt wife has D/C papers    ROS: Denies CP, SOB, N/V/D  Objective: Vital Signs: Blood pressure 110/67, pulse 90, temperature 98 F (36.7 C), resp. rate 18, height 6' (1.829 m), weight 88.5 kg, SpO2 97 %. No results found. No results for input(s): WBC, HGB, HCT, PLT in the last 72 hours. No results for input(s): NA, K, CL, CO2, GLUCOSE, BUN, CREATININE, CALCIUM in the last 72 hours.  Physical Exam: BP 110/67 (BP Location: Right Arm)   Pulse 90   Temp 98 F (36.7 C)   Resp 18   Ht 6' (1.829 m)   Wt 88.5 kg   SpO2 97%   BMI 26.46 kg/m   Constitutional: No distress . Vital signs reviewed. HENT: Normocephalic.  Atraumatic. Eyes: EOMI. No discharge. Cardiovascular: No JVD. Respiratory: Normal effort.  No stridor. GI: Non-distended. Skin: Warm and dry.  Intact. Psych: Flat Musc: Lower extremity edema. GU: + Foley. Neuro: Alert Dysarthria, unchanged Motor:  Right lower extremity: 1/5 proximal distal, stable Left lower extremity: Hip flexion, knee extension 1+/5, ankle dorsiflexion 1+/5, unchanged Ataxia R FNF  Assessment/Plan: 1. Functional deficits secondary to debility which require 3+ hours per day of interdisciplinary therapy in a comprehensive inpatient rehab setting.  Physiatrist is providing close team supervision and 24 hour management of active medical problems listed below.  Physiatrist and rehab team continue to assess barriers to discharge/monitor patient progress toward functional and medical goals  Care Tool:  Bathing    Body parts bathed by patient: Right arm, Left arm, Chest, Abdomen, Front perineal area, Right upper leg, Left upper leg, Face   Body parts bathed by helper: Buttocks, Right lower leg, Left lower leg     Bathing assist Assist Level: Moderate Assistance - Patient 50 - 74%      Upper Body Dressing/Undressing Upper body dressing   What is the patient wearing?: Pull over shirt    Upper body assist Assist Level: Moderate Assistance - Patient 50 - 74%    Lower Body Dressing/Undressing Lower body dressing      What is the patient wearing?: Pants, Incontinence brief     Lower body assist Assist for lower body dressing: 2 Helpers     Toileting Toileting    Toileting assist Assist for toileting: Dependent - Patient 0%     Transfers Chair/bed transfer  Transfers assist  Chair/bed transfer activity did not occur: Safety/medical concerns  Chair/bed transfer assist level: 2 Helpers     Locomotion Ambulation   Ambulation assist   Ambulation activity did not occur: Safety/medical concerns(Poor trunk control, bilateral LE weakness, fatigue)          Walk 10 feet activity   Assist  Walk 10 feet activity did not occur: Safety/medical concerns(Poor trunk control, bilateral LE weakness, fatigue)        Walk 50 feet activity   Assist Walk 50 feet with 2 turns activity did not occur: Safety/medical concerns(Poor trunk control, bilateral LE weakness, fatigue)         Walk 150 feet activity   Assist Walk 150 feet activity did not occur: Safety/medical concerns(Poor trunk control, bilateral LE weakness, fatigue)         Walk 10 feet on uneven surface  activity   Assist Walk 10 feet on uneven surfaces activity did not occur: Safety/medical concerns(Poor trunk control, bilateral LE weakness, fatigue)  Wheelchair     Assist Will patient use wheelchair at discharge?: Yes Type of Wheelchair: Manual Wheelchair activity did not occur: Safety/medical concerns(fatigue, poor trunk control, decreased UE strength)  Wheelchair assist level: Supervision/Verbal cueing Max wheelchair distance: 53ft    Wheelchair 50 feet with 2 turns activity    Assist    Wheelchair 50 feet with 2 turns activity did not occur:  Safety/medical concerns(fatigue, poor trunk control, decreased UE strength)   Assist Level: Supervision/Verbal cueing   Wheelchair 150 feet activity     Assist Wheelchair 150 feet activity did not occur: Safety/medical concerns(fatigue, poor trunk control, decreased UE strength)          Medical Problem List and Plan: 1.  Decreased functional mobility secondary to acute respiratory distress with hypoxic respiratory failure secondary to multi focal pneumonia in the setting of MS.    Follow-up chest x-ray 12/07/2019 bibasilar pulmonary infiltrates improved from prior exams, repeat chest x-ray on 12/21 personally reviewed showing?  Persistence of infiltrate- monitor at present per Pulm.   Patient completed 10-day course of Augmentin on 12/17  Completed 5-day course of IV Solu-Medrol.    May d/c once family training is completed   Palliative care consulted, appreciate recs-DNR/DNI.  No changes in current management while in rehab.  Discussed with palliative care previously -appreciate recs.  Patient continues to refuse medications and therapies 2.  Antithrombotics: DVT/anticoagulation: Subcutaneous Lovenox.  CT angiogram of chest negative for pulmonary emboli  CBC within normal limits on 1/4             Antiplatelet therapy: N/A 3. Pain Management/spasticity: Continue meds, see #12 4. Mood: Provide emotional support  Appreciate neuropsych eval  Fluoxetine started on 12/18, increased on 12/26- refused fluoxetine    depressed    Continues to refuse meds             -antipsychotic agents: N/A 5. Neuropsych: This patient is not capable of making decisions on his own behalf. 6. Skin/Wound Care: Routine skin checks/pressure injury to buttocks.  Follow-up skin care checks 7. Fluids/Electrolytes/Nutrition: Routine in and outs 8.  Atrial fibrillation with RVR.  IV Cardizem discontinued due to hypotension.  No current plan for anticoagulation  Monitor with increased mobility. 9.   Neurogenic bowel bladder with recurrent UTIs.  Indwelling Foley catheter tube to remain in place at the recommendations of urology services per plan outpatient evaluation for possible need for suprapubic tube  UA on 12/21 suggestive of infection, urine culture showing Pseudomonas, pansensitive  Ciprofloxacin course completed on 1/2  Continue foley  10.  Dysphagia.  Advanced to Dysphagia # 3, nectars -patient insistent on wanting to drink water  Continue to advance diet as tolerated. 11.AKI: Resolved  Creatinine within normal limits on 1/4  D/ced IVF 12. Spasticity in setting of MS  Baclofen refused tone is ok    Monitor for lethargy  Patient does not want any further aggressive management at present, refusing medications at present 13. Steroid induced hyperglycemia: ?Resolved  Cont to monitor 14.  Hypernatremia: Resolved  Sodium 142 on 1/1  Cont to monitor 15. Hypoalbuminemia  Supplement initiated on 12/16 16.  Transaminitis  ALT elevated, but improving on 1/4  Continue to monitor 17. ABLA: Resolved  Hb 13.9 on 12/21  Cont to monitor 18.  Neurogenic bowel  Bowel meds increased on 12/27  Overall stable  LOS: 25 days A FACE TO FACE EVALUATION WAS PERFORMED  Charlett Blake 01/01/2020, 10:45 AM

## 2020-01-01 NOTE — Progress Notes (Signed)
Patients wounds were visualized tonight. He has a stage 2 pressure injury to the left buttock that is tender to the touch. The wound bed is red & pink, minimal drainage noted. The right buttock looks like MASD, the coccyx/sacral area is red but blanchable. The area was covered with a foam drsg. The nurse in charge of the patient was present at the time. Left in care of his nurse.

## 2020-01-01 NOTE — Progress Notes (Signed)
Occupational Therapy Session Note  Patient Details  Name: Stephen Wade MRN: [id number] Date of Birth: 08-29-1951  Today's Date: 01/01/2020 OT Individual Time: 0165-5374 OT Individual Time Calculation (min): 13 min  17 minutes missed   Skilled Therapeutic Interventions/Progress Updates:    Pt greeted in bed and denying pain. He requested for bedlevel tx and self directed B LE stretches he wanted to be completed. OT assisted him with hip extension, hip abduction, and knee extension exercises. He was then boosted up in bed with Mod A and max encouragement to assist using headboard and his Lt UE. Pt repositioned for comfort and left with all needs within reach, 4 bedrails up per request, and bed alarm set. Time missed due to pt refusal.   Therapy Documentation Precautions:  Precautions Precautions: Fall Precaution Comments: hx MS (R LE deficits > L LE, spastic diplegia) Restrictions Weight Bearing Restrictions: No Vital Signs: Therapy Vitals Temp: 97.6 F (36.4 C) Pulse Rate: 83 Resp: 16 BP: 121/69 Patient Position (if appropriate): Sitting Oxygen Therapy SpO2: 96 % O2 Device: Room Air ADL: ADL Eating: Minimal assistance Where Assessed-Eating: Bed level Grooming: Supervision/safety, Setup Where Assessed-Grooming: Sitting at sink Upper Body Bathing: Minimal assistance Where Assessed-Upper Body Bathing: Shower, Sitting at sink Lower Body Bathing: Moderate assistance Where Assessed-Lower Body Bathing: Bed level, Shower(can complete at shower level with roll-in shower chair, but this is not recommended at home due to home setup) Upper Body Dressing: Moderate assistance Where Assessed-Upper Body Dressing: Sitting at sink Lower Body Dressing: Dependent Where Assessed-Lower Body Dressing: Bed level Toileting: Dependent Where Assessed-Toileting: Bed level Toilet Transfer: Dependent(hoyer vs +2 slide board) Toilet Transfer Method: Unable to assess Toilet Transfer Equipment: Drop  arm bedside commode(slide board) Tub/Shower Transfer: Unable to assess [employer] Transfer: Maximal assistance(+2 to roll in shower chair) Astronomer: (roll-in shower chair)      Therapy/Group: Individual Therapy  Jeimy Bickert A Jibreel Fedewa 01/01/2020, 4:07 PM

## 2020-01-01 NOTE — Discharge Instructions (Signed)
Inpatient Rehab Discharge Instructions  Stephen Wade Discharge date and time: No discharge date for patient encounter.   Activities/Precautions/ Functional Status: Activity: activity as tolerated Diet:  Wound Care: keep wound clean and dry Functional status:  ___ No restrictions     ___ Walk up steps independently ___ 24/7 supervision/assistance   ___ Walk up steps with assistance ___ Intermittent supervision/assistance  ___ Bathe/dress independently ___ Walk with walker     _x__ Bathe/dress with assistance ___ Walk Independently    ___ Shower independently ___ Walk with assistance    ___ Shower with assistance ___ No alcohol     ___ Return to work/school ________    COMMUNITY REFERRALS UPON DISCHARGE:    Home Health:   PT     OT     ST    RN                      Agency:  [facility] Phone: [phone]   Medical Equipment/Items Ordered:  Michiel Sites lift                                                       Agency/Supplier:  [facility] @ [phone]      Special Instructions: No driving smoking or alcohol  Foley catheter tube to remain in place with follow-up urology services to establish possible need for suprapubic tube versus voiding trial for neurogenic bladder   My questions have been answered and I understand these instructions. I will adhere to these goals and the provided educational materials after my discharge from the hospital.  Patient/Caregiver Signature _______________________________ Date __________  Clinician Signature _______________________________________ Date __________  Please bring this form and your medication list with you to all your follow-up doctor's appointments.

## 2020-01-01 NOTE — Progress Notes (Addendum)
Patient refused all medications and dietary supplements this morning. MD made aware.   Water protocol done per order following oral care, patient tolerated well, approx 6 oz with one cough. Water protocol done 2 more times with spouse, one more with RN, 8 oz no cough

## 2020-01-01 NOTE — Progress Notes (Signed)
Physical Therapy Session Note  Patient Details  Name: Stephen Wade MRN: [id number] Date of Birth: Feb 25, 1951  Today's Date: 01/01/2020 PT Individual Time: 1100-1115 PT Individual Time Calculation (min): 15 min   Short Term Goals: Week 1:  PT Short Term Goal 1 (Week 1): Pt will perform bed mobility with mod assist PT Short Term Goal 1 - Progress (Week 1): Progressing toward goal PT Short Term Goal 2 (Week 1): Pt will transfer to [facility] with mod assist PT Short Term Goal 2 - Progress (Week 1): Progressing toward goal PT Short Term Goal 3 (Week 1): Pt will perform sit<>stand with mod assist and BUE support on rail or in stedy PT Short Term Goal 3 - Progress (Week 1): Progressing toward goal PT Short Term Goal 4 (Week 1): Pt will propell WC >129f with supevision assist PT Short Term Goal 4 - Progress (Week 1): Progressing toward goal PT Short Term Goal 5 (Week 1): Pt will maintain sittng balance EOB with mod assist up to 5 min to prepare for use of power WC/scooter. PT Short Term Goal 5 - Progress (Week 1): Met  Skilled Therapeutic Interventions/Progress Updates:  Pt was seen bedside in the am. Pt unwilling to get edge of bed or out of bed with therapy. Attempted to encourage participation with therapy. Pt stating he just wants to go home. I am not going to get up today. Pt agreed to allow for ROM and stretching B LEs only. Performed ROM B LEs and static stretching as tolerated. Pt left sitting up in bed with call bell within reach.   Therapy Documentation Precautions:  Precautions Precautions: Fall Precaution Comments: hx MS (R LE deficits > L LE, spastic diplegia) Restrictions Weight Bearing Restrictions: No General: PT Amount of Missed Time (min): 45 Minutes PT Missed Treatment Reason: Patient unwilling to participate Vital Signs:   Pain: Pain Assessment Pain Scale: 0-10 Pain Score: 0-No pain   Therapy/Group: Individual Therapy  MDeng, Kemler1/08/2020, 12:37 PM

## 2020-01-02 ENCOUNTER — Inpatient Hospital Stay (HOSPITAL_COMMUNITY): Payer: Medicare Other | Admitting: Physical Therapy

## 2020-01-02 ENCOUNTER — Inpatient Hospital Stay (HOSPITAL_COMMUNITY): Payer: Medicare Other | Admitting: Occupational Therapy

## 2020-01-02 NOTE — Progress Notes (Signed)
Occupational Therapy Session Note  Patient Details  Name: Stephen Wade MRN: [id number] Date of Birth: [birth year]  Today's Date: 01/02/2020 30 minutes missed    Short Term Goals: Week 4:  OT Short Term Goal 1 (Week 4): STG = LTG due to remaining LOS  Skilled Therapeutic Interventions/Progress Updates:    Pt greeted in bed with no c/o pain. Refusing therapy participation because he was leaving tomorrow and at this point he just wants "to go home." Encouraged him to engage in tx however pt still refused. Left him with all needs and bed alarm set. 30 minutes missed.    Therapy Documentation Precautions:  Precautions Precautions: Fall Precaution Comments: hx MS (R LE deficits > L LE, spastic diplegia) Restrictions Weight Bearing Restrictions: No Vital Signs: Therapy Vitals Temp: 98.1 F (36.7 C) Pulse Rate: 88 Resp: 16 BP: 103/64 Patient Position (if appropriate): Lying Oxygen Therapy SpO2: 97 % O2 Device: Room Air ADL: ADL Eating: Minimal assistance Where Assessed-Eating: Bed level Grooming: Supervision/safety, Setup Where Assessed-Grooming: Sitting at sink Upper Body Bathing: Minimal assistance Where Assessed-Upper Body Bathing: Shower, Sitting at sink Lower Body Bathing: Moderate assistance Where Assessed-Lower Body Bathing: Bed level, Shower(can complete at shower level with roll-in shower chair, but this is not recommended at home due to home setup) Upper Body Dressing: Moderate assistance Where Assessed-Upper Body Dressing: Sitting at sink Lower Body Dressing: Dependent Where Assessed-Lower Body Dressing: Bed level Toileting: Dependent Where Assessed-Toileting: Bed level Toilet Transfer: Dependent(hoyer vs +2 slide board) Toilet Transfer Method: Unable to assess Toilet Transfer Equipment: Drop arm bedside commode(slide board) Tub/Shower Transfer: Unable to assess [employer] Transfer: Maximal assistance(+2 to roll in shower chair) Astronomer:  (roll-in shower chair)      Therapy/Group: Individual Therapy  Jackee Glasner A Angelin Cutrone 01/02/2020, 7:32 AM

## 2020-01-02 NOTE — Progress Notes (Signed)
Physical Therapy Session Note  Patient Details  Name: Stephen Wade MRN: [id number] Date of Birth: [birth year]  Today's Date: 01/02/2020 PT Individual Time: 0930-0955 PT Individual Time Calculation (min): 25 min   Short Term Goals: Week 1:  PT Short Term Goal 1 (Week 1): Pt will perform bed mobility with mod assist PT Short Term Goal 1 - Progress (Week 1): Progressing toward goal PT Short Term Goal 2 (Week 1): Pt will transfer to [facility] with mod assist PT Short Term Goal 2 - Progress (Week 1): Progressing toward goal PT Short Term Goal 3 (Week 1): Pt will perform sit<>stand with mod assist and BUE support on rail or in stedy PT Short Term Goal 3 - Progress (Week 1): Progressing toward goal PT Short Term Goal 4 (Week 1): Pt will propell WC >149f with supevision assist PT Short Term Goal 4 - Progress (Week 1): Progressing toward goal PT Short Term Goal 5 (Week 1): Pt will maintain sittng balance EOB with mod assist up to 5 min to prepare for use of power WC/scooter. PT Short Term Goal 5 - Progress (Week 1): Met  Skilled Therapeutic Interventions/Progress Updates:  Pt was seen bedside in the am. Initially performed LE exercises and static stretching as tolerated. Pt transferred supine to edge of bed with +2 assist for safety. Pt tolerated edge of bed about 5 minutes with min to mod A and UE support. Pt transferred edge of bed to supine with +2 assist for safety. Pt rolled R/L with side rails and mod A with verbal cues. Pt moved up in bed with +2 assist. Pt left sitting up in bed with call bell within reach and bed alarm on.   Therapy Documentation Precautions:  Precautions Precautions: Fall Precaution Comments: hx MS (R LE deficits > L LE, spastic diplegia) Restrictions Weight Bearing Restrictions: No General:   Pain: No c/o pain.   Therapy/Group: Individual Therapy  MNorm, Wray1/09/2020, 10:52 AM

## 2020-01-02 NOTE — Progress Notes (Signed)
Provided foley care education

## 2020-01-02 NOTE — Progress Notes (Addendum)
[place] PHYSICAL MEDICINE & REHABILITATION PROGRESS NOTE  Subjective/Complaints:  Wife is at bedside, feels like she has had good training for upcoming discharge to home  Patient was not discharged yesterday because hired caregivers could not get started until 01/03/2020.  Wife is unable to care for patient by herself Oriented x 3 , ataxic dysarthria, family training scheduled today, per pt wife has D/C papers    ROS: Denies CP, SOB, N/V/D  Objective: Vital Signs: Blood pressure 110/66, pulse 92, temperature 97.9 F (36.6 C), temperature source Oral, resp. rate 18, height 6' (1.829 m), weight 88.5 kg, SpO2 97 %. No results found. No results for input(s): WBC, HGB, HCT, PLT in the last 72 hours. No results for input(s): NA, K, CL, CO2, GLUCOSE, BUN, CREATININE, CALCIUM in the last 72 hours.  Physical Exam: BP 110/66 (BP Location: Right Arm)   Pulse 92   Temp 97.9 F (36.6 C) (Oral)   Resp 18   Ht 6' (1.829 m)   Wt 88.5 kg   SpO2 97%   BMI 26.46 kg/m   Constitutional: No distress . Vital signs reviewed. HENT: Normocephalic.  Atraumatic. Eyes: EOMI. No discharge. Cardiovascular: No JVD. Respiratory: Normal effort.  No stridor. GI: Non-distended. Skin: Warm and dry.  Intact. Psych: Flat Musc: Lower extremity edema. GU: + Foley. Neuro: Alert Dysarthria, unchanged Motor:  Right lower extremity: 1/5 proximal distal, stable Left lower extremity: Hip flexion, knee extension 1+/5, ankle dorsiflexion 1+/5, unchanged Ataxia R FNF, neuro exam stable  Assessment/Plan: 1. Functional deficits secondary to debility which require 3+ hours per day of interdisciplinary therapy in a comprehensive inpatient rehab setting.  Physiatrist is providing close team supervision and 24 hour management of active medical problems listed below.  Physiatrist and rehab team continue to assess barriers to discharge/monitor patient progress toward functional and medical goals  Care  Tool:  Bathing    Body parts bathed by patient: Right arm, Left arm, Chest, Abdomen, Front perineal area, Right upper leg, Left upper leg, Face   Body parts bathed by helper: Buttocks, Right lower leg, Left lower leg     Bathing assist Assist Level: Moderate Assistance - Patient 50 - 74%     Upper Body Dressing/Undressing Upper body dressing   What is the patient wearing?: Pull over shirt    Upper body assist Assist Level: Moderate Assistance - Patient 50 - 74%    Lower Body Dressing/Undressing Lower body dressing      What is the patient wearing?: Pants, Incontinence brief     Lower body assist Assist for lower body dressing: 2 Helpers     Toileting Toileting    Toileting assist Assist for toileting: Dependent - Patient 0%     Transfers Chair/bed transfer  Transfers assist  Chair/bed transfer activity did not occur: Safety/medical concerns  Chair/bed transfer assist level: 2 Helpers     Locomotion Ambulation   Ambulation assist   Ambulation activity did not occur: Safety/medical concerns(Poor trunk control, bilateral LE weakness, fatigue)          Walk 10 feet activity   Assist  Walk 10 feet activity did not occur: Safety/medical concerns(Poor trunk control, bilateral LE weakness, fatigue)        Walk 50 feet activity   Assist Walk 50 feet with 2 turns activity did not occur: Safety/medical concerns(Poor trunk control, bilateral LE weakness, fatigue)         Walk 150 feet activity   Assist Walk 150 feet activity did not  occur: Safety/medical concerns(Poor trunk control, bilateral LE weakness, fatigue)         Walk 10 feet on uneven surface  activity   Assist Walk 10 feet on uneven surfaces activity did not occur: Safety/medical concerns(Poor trunk control, bilateral LE weakness, fatigue)         Wheelchair     Assist Will patient use wheelchair at discharge?: Yes Type of Wheelchair: Manual Wheelchair activity did not  occur: Safety/medical concerns(fatigue, poor trunk control, decreased UE strength)  Wheelchair assist level: Supervision/Verbal cueing Max wheelchair distance: 13ft    Wheelchair 50 feet with 2 turns activity    Assist    Wheelchair 50 feet with 2 turns activity did not occur: Safety/medical concerns(fatigue, poor trunk control, decreased UE strength)   Assist Level: Supervision/Verbal cueing   Wheelchair 150 feet activity     Assist Wheelchair 150 feet activity did not occur: Safety/medical concerns(fatigue, poor trunk control, decreased UE strength)          Medical Problem List and Plan: 1.  Decreased functional mobility secondary to acute respiratory distress with hypoxic respiratory failure secondary to multi focal pneumonia in the setting of MS.    Follow-up chest x-ray 12/07/2019 bibasilar pulmonary infiltrates improved from prior exams, repeat chest x-ray on 12/21 personally reviewed showing?  Persistence of infiltrate- monitor at present per Pulm.   Patient completed 10-day course of Augmentin on 12/17  Completed 5-day course of IV Solu-Medrol.    May d/c once family training is completed, will still need hired caregivers and they will be available on 01/03/2020  Palliative care consulted, appreciate recs-DNR/DNI.  No changes in current management while in rehab.  Discussed with palliative care previously -appreciate recs.  Patient continues to refuse medications and therapies 2.  Antithrombotics: DVT/anticoagulation: Subcutaneous Lovenox.  CT angiogram of chest negative for pulmonary emboli  CBC within normal limits on 1/4             Antiplatelet therapy: N/A 3. Pain Management/spasticity: Continue meds, see #12 4. Mood: Provide emotional support  Appreciate neuropsych eval  Fluoxetine started on 12/18, increased on 12/26- refused fluoxetine    depressed    Continues to refuse meds             -antipsychotic agents: N/A 5. Neuropsych: This patient is not  capable of making decisions on his own behalf. 6. Skin/Wound Care: Routine skin checks/pressure injury to buttocks.  Follow-up skin care checks 7. Fluids/Electrolytes/Nutrition: Routine in and outs 8.  Atrial fibrillation with RVR.  IV Cardizem discontinued due to hypotension.  No current plan for anticoagulation  Monitor with increased mobility. 9.  Neurogenic bowel bladder with recurrent UTIs.  Indwelling Foley catheter tube to remain in place at the recommendations of urology services per plan outpatient evaluation for possible need for suprapubic tube  UA on 12/21 suggestive of infection, urine culture showing Pseudomonas, pansensitive  Ciprofloxacin course completed on 1/2  Continue foley  10.  Dysphagia.  Advanced to Dysphagia # 3, nectars -patient insistent on wanting to drink water  Continue to advance diet as tolerated. 11.AKI: Resolved  Creatinine within normal limits on 1/4  D/ced IVF 12. Spasticity in setting of MS  Baclofen refused tone is ok     Patient does not want any further aggressive management at present, refusing medications at present 13. Steroid induced hyperglycemia: ?Resolved  Cont to monitor 14.  Hypernatremia: Resolved  Sodium 142 on 1/1  Cont to monitor 15. Hypoalbuminemia  Supplement initiated on 12/16 16.  Transaminitis  ALT elevated, but improving on 1/4  Continue to monitor 17. ABLA: Resolved  Hb 13.9 on 12/21  Cont to monitor 18.  Neurogenic bowel  Bowel meds increased on 12/27  Overall stable  LOS: 26 days A FACE TO FACE EVALUATION WAS PERFORMED  Erick Colace 01/02/2020, 2:32 PM

## 2020-01-03 ENCOUNTER — Telehealth: Payer: Self-pay | Admitting: *Deleted

## 2020-01-03 LAB — CBC WITH DIFFERENTIAL/PLATELET
Abs Immature Granulocytes: 0.06 10*3/uL (ref 0.00–0.07)
Basophils Absolute: 0.1 10*3/uL (ref 0.0–0.1)
Basophils Relative: 1 %
Eosinophils Absolute: 0.2 10*3/uL (ref 0.0–0.5)
Eosinophils Relative: 3 %
HCT: 44.1 % (ref 39.0–52.0)
Hemoglobin: 14 g/dL (ref 13.0–17.0)
Immature Granulocytes: 1 %
Lymphocytes Relative: 18 %
Lymphs Abs: 1 10*3/uL (ref 0.7–4.0)
MCH: 29.5 pg (ref 26.0–34.0)
MCHC: 31.7 g/dL (ref 30.0–36.0)
MCV: 92.8 fL (ref 80.0–100.0)
Monocytes Absolute: 0.7 10*3/uL (ref 0.1–1.0)
Monocytes Relative: 13 %
Neutro Abs: 3.5 10*3/uL (ref 1.7–7.7)
Neutrophils Relative %: 64 %
Platelets: 306 10*3/uL (ref 150–400)
RBC: 4.75 MIL/uL (ref 4.22–5.81)
RDW: 14 % (ref 11.5–15.5)
WBC: 5.5 10*3/uL (ref 4.0–10.5)
nRBC: 0 % (ref 0.0–0.2)

## 2020-01-03 NOTE — Progress Notes (Signed)
[place] PHYSICAL MEDICINE & REHABILITATION PROGRESS NOTE  Subjective/Complaints: Patient seen sitting up in bed this morning.  He states he slept well overnight.  He states he had a good weekend.  He states he is ready for discharge.  ROS: Denies CP, SOB, N/V/D  Objective: Vital Signs: Blood pressure 129/87, pulse 89, temperature 97.6 F (36.4 C), temperature source Axillary, resp. rate 16, height 6' (1.829 m), weight 88.5 kg, SpO2 96 %. No results found. No results for input(s): WBC, HGB, HCT, PLT in the last 72 hours. No results for input(s): NA, K, CL, CO2, GLUCOSE, BUN, CREATININE, CALCIUM in the last 72 hours.  Physical Exam: BP 129/87 (BP Location: Right Arm)   Pulse 89   Temp 97.6 F (36.4 C) (Axillary)   Resp 16   Ht 6' (1.829 m)   Wt 88.5 kg   SpO2 96%   BMI 26.46 kg/m   Constitutional: No distress . Vital signs reviewed. HENT: Normocephalic.  Atraumatic. Eyes: EOMI. No discharge. Cardiovascular: No JVD. Respiratory: Normal effort.  No stridor. GI: Non-distended. Skin: Warm and dry.  Intact. Psych: Flat. Musc: Lower extremity edema. GU: + Foley. Neuro: Alert Dysarthria, stable Motor:  Right lower extremity: 1/5 proximal distal, stable Left lower extremity: Hip flexion, knee extension 1+/5, ankle dorsiflexion 1+/5, unchanged Increased tone bilateral lower extremities Right upper extremity ataxia  Assessment/Plan: 1. Functional deficits secondary to debility which require 3+ hours per day of interdisciplinary therapy in a comprehensive inpatient rehab setting.  Physiatrist is providing close team supervision and 24 hour management of active medical problems listed below.  Physiatrist and rehab team continue to assess barriers to discharge/monitor patient progress toward functional and medical goals  Care Tool:  Bathing    Body parts bathed by patient: Right arm, Left arm, Chest, Abdomen, Front perineal area, Right upper leg, Left upper leg, Face    Body parts bathed by helper: Buttocks, Right lower leg, Left lower leg     Bathing assist Assist Level: Moderate Assistance - Patient 50 - 74%     Upper Body Dressing/Undressing Upper body dressing   What is the patient wearing?: Pull over shirt    Upper body assist Assist Level: Moderate Assistance - Patient 50 - 74%    Lower Body Dressing/Undressing Lower body dressing      What is the patient wearing?: Pants, Incontinence brief     Lower body assist Assist for lower body dressing: 2 Helpers     Toileting Toileting    Toileting assist Assist for toileting: Dependent - Patient 0%     Transfers Chair/bed transfer  Transfers assist  Chair/bed transfer activity did not occur: Safety/medical concerns  Chair/bed transfer assist level: 2 Helpers     Locomotion Ambulation   Ambulation assist   Ambulation activity did not occur: Safety/medical concerns(Poor trunk control, bilateral LE weakness, fatigue)          Walk 10 feet activity   Assist  Walk 10 feet activity did not occur: Safety/medical concerns(Poor trunk control, bilateral LE weakness, fatigue)        Walk 50 feet activity   Assist Walk 50 feet with 2 turns activity did not occur: Safety/medical concerns(Poor trunk control, bilateral LE weakness, fatigue)         Walk 150 feet activity   Assist Walk 150 feet activity did not occur: Safety/medical concerns(Poor trunk control, bilateral LE weakness, fatigue)         Walk 10 feet on uneven surface  activity  Assist Walk 10 feet on uneven surfaces activity did not occur: Safety/medical concerns(Poor trunk control, bilateral LE weakness, fatigue)         Wheelchair     Assist Will patient use wheelchair at discharge?: Yes Type of Wheelchair: Manual Wheelchair activity did not occur: Safety/medical concerns(fatigue, poor trunk control, decreased UE strength)  Wheelchair assist level: Supervision/Verbal cueing Max  wheelchair distance: 15ft    Wheelchair 50 feet with 2 turns activity    Assist    Wheelchair 50 feet with 2 turns activity did not occur: Safety/medical concerns(fatigue, poor trunk control, decreased UE strength)   Assist Level: Supervision/Verbal cueing   Wheelchair 150 feet activity     Assist Wheelchair 150 feet activity did not occur: Safety/medical concerns(fatigue, poor trunk control, decreased UE strength)          Medical Problem List and Plan: 1.  Decreased functional mobility secondary to acute respiratory distress with hypoxic respiratory failure secondary to multi focal pneumonia in the setting of MS.    Follow-up chest x-ray 12/07/2019 bibasilar pulmonary infiltrates improved from prior exams, repeat chest x-ray on 12/21 personally reviewed showing?  Persistence of infiltrate- monitor at present per Pulm.   Patient completed 10-day course of Augmentin on 12/17  Completed 5-day course of IV Solu-Medrol.    Palliative care consulted, appreciate recs-DNR/DNI.  No changes in current management while in rehab.  Discussed with palliative care previously -appreciate recs.  Patient continues to refuse medications and therapies  DC today.  Wife has been trained in caregivers are said to be available today.  Will see patient for transitional care management in 1-2 weeks post-discharge 2.  Antithrombotics: DVT/anticoagulation: Subcutaneous Lovenox.  CT angiogram of chest negative for pulmonary emboli  CBC within normal limits on 1/4             Antiplatelet therapy: N/A 3. Pain Management/spasticity: Continue meds, see #12 4. Mood: Provide emotional support  Appreciate neuropsych eval  Fluoxetine started on 12/18, increased on 12/26-refusing   Continues to refuse meds             -antipsychotic agents: N/A 5. Neuropsych: This patient is not capable of making decisions on his own behalf. 6. Skin/Wound Care: Routine skin checks/pressure injury to buttocks.  Follow-up  skin care checks 7. Fluids/Electrolytes/Nutrition: Routine in and outs 8.  Atrial fibrillation with RVR.  IV Cardizem discontinued due to hypotension.  No current plan for anticoagulation  Monitor with increased mobility. 9.  Neurogenic bowel bladder with recurrent UTIs.  Indwelling Foley catheter tube to remain in place at the recommendations of urology services per plan outpatient evaluation for possible need for suprapubic tube  UA on 12/21 suggestive of infection, urine culture showing Pseudomonas, pansensitive  Ciprofloxacin course completed on 1/2  Continue foley  10.  Dysphagia.  Advanced to Dysphagia # 3, nectars -patient insistent on wanting to drink water  Continue to advance diet as tolerated. 11.AKI: Resolved  Creatinine within normal limits on 1/4  D/ced IVF 12. Spasticity in setting of MS  Refusing baclofen  Patient does not want any further aggressive management at present, refusing medications at present 13. Steroid induced hyperglycemia: ?Resolved  Cont to monitor 14.  Hypernatremia: Resolved  Sodium 142 on 1/1  Cont to monitor 15. Hypoalbuminemia  Supplement initiated on 12/16 16.  Transaminitis  ALT elevated, but improving on 1/4  Continue to monitor 17. ABLA: Resolved  Hb 13.9 on 12/21  Cont to monitor 18.  Neurogenic bowel  Bowel meds  increased on 12/27  Stable  > 30 minutes spent in discharge planning between myself and PA in counseling and coordination of care regarding discharge related to ability of wife to care for patient, hired caregiver support, [employer] lift, transfers, patient encouragement, etc.  LOS: 27 days A FACE TO FACE EVALUATION WAS PERFORMED  Rodrigo Mcgranahan Lorie Phenix 01/03/2020, 8:18 AM

## 2020-01-03 NOTE — Plan of Care (Signed)
  Problem: Consults Goal: RH GENERAL PATIENT EDUCATION Description: See Patient Education module for education specifics. Outcome: Progressing   Problem: RH BOWEL ELIMINATION Goal: RH STG MANAGE BOWEL WITH ASSISTANCE Description: STG Manage Bowel with Min Assistance. Outcome: Progressing Goal: RH STG MANAGE BOWEL W/MEDICATION W/ASSISTANCE Description: STG Manage Bowel with Medication with Min Assistance. Outcome: Progressing   Problem: RH BLADDER ELIMINATION Goal: RH STG MANAGE BLADDER WITH ASSISTANCE Description: STG Manage Bladder With max Assistance Outcome: Progressing   Problem: RH SKIN INTEGRITY Goal: RH STG SKIN FREE OF INFECTION/BREAKDOWN Description: Min assist Outcome: Progressing Goal: RH STG MAINTAIN SKIN INTEGRITY WITH ASSISTANCE Description: STG Maintain Skin Integrity With Min Assistance. Outcome: Progressing   Problem: RH SAFETY Goal: RH STG ADHERE TO SAFETY PRECAUTIONS W/ASSISTANCE/DEVICE Description: STG Adhere to Safety Precautions With Min Assistance/Device. Outcome: Progressing Goal: RH STG DECREASED RISK OF FALL WITH ASSISTANCE Description: STG Decreased Risk of Fall With [employer]. Outcome: Progressing   Problem: RH Pre-functional/Other (Specify) Goal: RH LTG Interdisciplinary (Specify) 1 Outcome: Progressing

## 2020-01-03 NOTE — Progress Notes (Signed)
Social Work Discharge Note   The overall goal for the admission was met for:   Discharge location: Yes - home with wife to provide 24/7 assistnace  Length of Stay: Yes - 27 days  Discharge activity level: No - not all goals met.  D/c at mod/ max assist level overall  Home/community participation: No  Services provided included: MD, RD, PT, OT, SLP, RN, TR, Pharmacy, [place]: Medicare and Private Insurance: [place]  Follow-up services arranged: Home Health: RN, PT, OT, ST via [employer], DME: hoyer lift with [employer] via [employer] and Patient/Family has no preference for HH/DME agencies  Comments (or additional information):    Contact person, wife, Magda Paganini @ [phone]  Patient/Family verbalized understanding of follow-up arrangements: Yes  Individual responsible for coordination of the follow-up plan: wife  Confirmed correct DME delivered: Lennart Pall 01/03/2020    Noell Lorensen

## 2020-01-03 NOTE — Telephone Encounter (Signed)
LVM for pt to call office and schedule appt if needed.

## 2020-01-03 NOTE — Plan of Care (Signed)
  Problem: Consults Goal: RH GENERAL PATIENT EDUCATION Description: See Patient Education module for education specifics. 01/03/2020 1139 by Ellison Carwin A, LPN Outcome: Completed/Met 01/03/2020 1139 by Ellison Carwin A, LPN Outcome: Progressing   Problem: RH BOWEL ELIMINATION Goal: RH STG MANAGE BOWEL WITH ASSISTANCE Description: STG Manage Bowel with Min Assistance. 01/03/2020 1139 by Adria Devon, LPN Outcome: Completed/Met 01/03/2020 1139 by Adria Devon, LPN Outcome: Progressing Goal: RH STG MANAGE BOWEL W/MEDICATION W/ASSISTANCE Description: STG Manage Bowel with Medication with [place]. 01/03/2020 1139 by Ellison Carwin A, LPN Outcome: Completed/Met 01/03/2020 1139 by Ellison Carwin A, LPN Outcome: Progressing   Problem: RH BLADDER ELIMINATION Goal: RH STG MANAGE BLADDER WITH ASSISTANCE Description: STG Manage Bladder With max Assistance 01/03/2020 1139 by Ellison Carwin A, LPN Outcome: Completed/Met 01/03/2020 1139 by Ellison Carwin A, LPN Outcome: Progressing   Problem: RH SKIN INTEGRITY Goal: RH STG SKIN FREE OF INFECTION/BREAKDOWN Description: Min assist 01/03/2020 1139 by Ellison Carwin A, LPN Outcome: Completed/Met 01/03/2020 1139 by Adria Devon, LPN Outcome: Progressing Goal: RH STG MAINTAIN SKIN INTEGRITY WITH ASSISTANCE Description: STG Maintain Skin Integrity With [place]. 01/03/2020 1139 by Ellison Carwin A, LPN Outcome: Completed/Met 01/03/2020 1139 by Ellison Carwin A, LPN Outcome: Progressing   Problem: RH SAFETY Goal: RH STG ADHERE TO SAFETY PRECAUTIONS W/ASSISTANCE/DEVICE Description: STG Adhere to Safety Precautions With Min Assistance/Device. 01/03/2020 1139 by Ellison Carwin A, LPN Outcome: Completed/Met 01/03/2020 1139 by Adria Devon, LPN Outcome: Progressing Goal: RH STG DECREASED RISK OF FALL WITH ASSISTANCE Description: STG Decreased Risk of Fall With [employer]. 01/03/2020 1139 by  Ellison Carwin A, LPN Outcome: Completed/Met 01/03/2020 1139 by Ellison Carwin A, LPN Outcome: Progressing   Problem: RH Dressing Goal: LTG Patient will perform lower body dressing w/assist (OT) Description: LTG: Patient will perform lower body dressing with assist, with/without cues in positioning using equipment (OT) Outcome: Completed/Met   Problem: RH Toilet Transfers Goal: LTG Patient will perform toilet transfers w/assist (OT) Description: LTG: Patient will perform toilet transfers with assist, with/without cues using equipment (OT) Outcome: Completed/Met   Problem: RH Bed Mobility Goal: LTG Patient will perform bed mobility with assist (PT) Description: LTG: Patient will perform bed mobility with assistance, with/without cues (PT). Outcome: Completed/Met   Problem: RH Bed to Chair Transfers Goal: LTG Patient will perform bed/chair transfers w/assist (PT) Description: LTG: Patient will perform bed to chair transfers with assistance (PT). Outcome: Completed/Met   Problem: RH Leisure Awareness Goal: LTG: Patient will participate in leisure activities (TR) Description: LTG: Patient will participate in leisure activities (simple/moderate/difficult) to increase ability to functionally perform activity, identify and utilize resources, identify new leisure interests, utilize Research scientist (life sciences) at specific level  (TR) Outcome: Completed/Met   Problem: RH Pre-functional/Other (Specify) Goal: RH LTG PT (Specify) 2 Description: Pt will remain OOB for at least 1 hour each day  Outcome: Completed/Met   Problem: RH Pre-functional/Other (Specify) Goal: RH LTG Interdisciplinary (Specify) 1 01/03/2020 1139 by Ellison Carwin A, LPN Outcome: Completed/Met 01/03/2020 1139 by Ellison Carwin A, LPN Outcome: Progressing Goal: RH LTG OT (Specify) 1 Description: RH LTG OT (Specify) 1 Outcome: Completed/Met Goal: RH LTG OT (Specify) 2 Description: RH LTG OT (Specify) 2  Outcome:  Completed/Met

## 2020-01-04 ENCOUNTER — Telehealth: Payer: Self-pay | Admitting: Registered Nurse

## 2020-01-04 DIAGNOSIS — I4891 Unspecified atrial fibrillation: Secondary | ICD-10-CM | POA: Diagnosis not present

## 2020-01-04 DIAGNOSIS — Z8701 Personal history of pneumonia (recurrent): Secondary | ICD-10-CM | POA: Diagnosis not present

## 2020-01-04 DIAGNOSIS — R1310 Dysphagia, unspecified: Secondary | ICD-10-CM | POA: Diagnosis not present

## 2020-01-04 DIAGNOSIS — Z87440 Personal history of urinary (tract) infections: Secondary | ICD-10-CM | POA: Diagnosis not present

## 2020-01-04 DIAGNOSIS — L89151 Pressure ulcer of sacral region, stage 1: Secondary | ICD-10-CM | POA: Diagnosis not present

## 2020-01-04 DIAGNOSIS — E46 Unspecified protein-calorie malnutrition: Secondary | ICD-10-CM | POA: Diagnosis not present

## 2020-01-04 DIAGNOSIS — N319 Neuromuscular dysfunction of bladder, unspecified: Secondary | ICD-10-CM | POA: Diagnosis not present

## 2020-01-04 DIAGNOSIS — Z466 Encounter for fitting and adjustment of urinary device: Secondary | ICD-10-CM | POA: Diagnosis not present

## 2020-01-04 DIAGNOSIS — R5381 Other malaise: Secondary | ICD-10-CM | POA: Diagnosis not present

## 2020-01-04 DIAGNOSIS — G35 Multiple sclerosis: Secondary | ICD-10-CM | POA: Diagnosis not present

## 2020-01-04 DIAGNOSIS — F329 Major depressive disorder, single episode, unspecified: Secondary | ICD-10-CM | POA: Diagnosis not present

## 2020-01-04 NOTE — Telephone Encounter (Signed)
Transitional Care call Transitional Questions answered by [name]   Patient name: Stephen Wade  DOB: 07-19-1951 1. Are you/is patient experiencing any problems since coming home? No a. Are there any questions regarding any aspect of care? No 2. Are there any questions regarding medications administration/dosing? No a. Are meds being taken as prescribed? No. Mr. Human refusing baclofe, [name] will be picking up his Prozac and omeprazole today or tomorrow she states.  b. "Patient should review meds with caller to confirm" Medication List Reviewed. 3. Have there been any falls? No 4. Has Home Health been to the house and/or have they contacted you? Yes. [facility] a. If not, have you tried to contact them? NA b. Can we help you contact them? NA 5. Are bowels and bladder emptying properly? He has a foley draining urine, he hasn't moved his bowels since discharge on 01/03/2020. Wife will continue to monitor.  a. Are there any unexpected incontinence issues? No b. If applicable, is patient following bowel/bladder programs? NA 6. Any fevers, problems with breathing, unexpected pain? No 7. Are there any skin problems or new areas of breakdown? No 8. Has the patient/family member arranged specialty MD follow up (ie cardiology/neurology/renal/surgical/etc.)?  [name] states she will call to schedule appointment with Urology and Neurology. He has an appointment with his PCP, she reports.  a. Can we help arrange? No 9. Does the patient need any other services or support that we can help arrange? No 10. Are caregivers following through as expected in assisting the patient? Yes. 11. Has the patient quit smoking, drinking alcohol, or using drugs as recommended? (                        )  Appointment date/time 01/18/2020  arrival time 10:20 for 10:40 appointment with Dr. Allena Katz. At [address] [facility] [address]

## 2020-01-05 ENCOUNTER — Telehealth: Payer: Self-pay | Admitting: Internal Medicine

## 2020-01-05 NOTE — Telephone Encounter (Signed)
Copied from CRM [id number]. Topic: Quick Communication - Home Health Verbal Orders >> Jan 05, 2020  4:16 PM Jaquita Rector A wrote: Caller/Agency: Velva Harman, [facility]  Callback Number: [phone] ok to LM  Requesting OT/PT/Skilled Nursing/Social Work/Speech Therapy: Skilled nursing OT, PT, and Speech Therapy  Frequency: 2 week 3 weeks, 1 week 6 for skilled nursing

## 2020-01-05 NOTE — Telephone Encounter (Signed)
Unable to reach pt. Pt has hospital follow up scheduled 01/06/20.

## 2020-01-06 ENCOUNTER — Telehealth: Payer: Self-pay | Admitting: Internal Medicine

## 2020-01-06 ENCOUNTER — Other Ambulatory Visit: Payer: Self-pay

## 2020-01-06 ENCOUNTER — Ambulatory Visit (INDEPENDENT_AMBULATORY_CARE_PROVIDER_SITE_OTHER): Payer: Medicare Other | Admitting: Internal Medicine

## 2020-01-06 DIAGNOSIS — N390 Urinary tract infection, site not specified: Secondary | ICD-10-CM | POA: Diagnosis not present

## 2020-01-06 DIAGNOSIS — Z978 Presence of other specified devices: Secondary | ICD-10-CM

## 2020-01-06 DIAGNOSIS — I4891 Unspecified atrial fibrillation: Secondary | ICD-10-CM

## 2020-01-06 DIAGNOSIS — F331 Major depressive disorder, recurrent, moderate: Secondary | ICD-10-CM

## 2020-01-06 MED ORDER — ONDANSETRON HCL 4 MG PO TABS: 4.0000 mg | ORAL_TABLET | Freq: Three times a day (TID) | ORAL | 0 refills | Status: DC | PRN

## 2020-01-06 MED ORDER — DICYCLOMINE HCL 10 MG PO CAPS: 10.0000 mg | ORAL_CAPSULE | Freq: Three times a day (TID) | ORAL | 0 refills | Status: DC | PRN

## 2020-01-06 NOTE — Telephone Encounter (Signed)
Yes

## 2020-01-06 NOTE — Progress Notes (Signed)
Subjective:    Patient ID: Stephen Wade, male    DOB: 1951-02-22, 69 y.o.   MRN: [id number]  DOS:  01/06/2020 Type of visit - description: Virtual Visit via Video Note  I connected with the above patient  by a video enabled telemedicine application and verified that I am speaking with the correct person using two identifiers.   THIS ENCOUNTER IS A VIRTUAL VISIT DUE TO COVID-19 - PATIENT WAS NOT SEEN IN THE OFFICE. PATIENT HAS CONSENTED TO VIRTUAL VISIT / TELEMEDICINE VISIT   Location of patient: home  Location of provider: office  I discussed the limitations of evaluation and management by telemedicine and the availability of in person appointments. The patient expressed understanding and agreed to proceed.    TCM The patient was admitted to the hospital 11/27/2019 discharge 10 days later He had several problems: -Acute respiratory distress, acute hypoxia and respiratory failure due to multifocal pneumonia. Covid test negative Patient was DNR/DNI. CT negative for PE. Eventually recovered with antibiotics -UTI: Was treated on discharge with a Foley catheter   -A. fib with RVR: Treated, back on NSR, not anticoagulated -History of MS. -Multiple all issues addressed, eventually discharged to rehab.  He was discharged from the rehab service on 12/31/2019 He received PT, ST and OT. Also he participated in therapies, he frequently request discharge home.  He refused medications most days. Palliative care consulted, goals of care discussed.  He was made DNR. He was seen by neuropsychology. Eventually, he was felt to be stable to go home after extensive therapy family education.  Last labs reviewed: They were satisfactory.    Review of Systems Since he left the hospital he is at home, under the care of his wife. No fever chills History of pressure ulcers but at this point no acute skin issues reported. Ambulatory blood pressures okay. Appetite is not very good. Last night had an  episode of abdominal pain associated with a single episode of vomiting. There is no diarrhea. This morning he feels better   Past Medical History:  Diagnosis Date  . GERD (gastroesophageal reflux disease)    had on- off chest pain, symptoms resolved with Prilosec 11/2010  . Lower urinary tract symptoms (LUTS)    Dr Gaynelle Arabian  . MS (multiple sclerosis) ([place]) 2005  . Testicular hypogonadism   . Urge incontinence of urine   . Urolithiasis 2011    Past Surgical History:  Procedure Laterality Date  . APPENDECTOMY      ruptured 03-2010  . VASECTOMY      Social History   Socioeconomic History  . Marital status: Married    Spouse name: Not on file  . Number of children: 3  . Years of education: Not on file  . Highest education level: Not on file  Occupational History  . Occupation: still works some from home    Employer: [employer]  Tobacco Use  . Smoking status: Former Research scientist (life sciences)  . Smokeless tobacco: Never Used  . Tobacco comment: 3 ppd, quit 1999  Substance and Sexual Activity  . Alcohol use: No    Alcohol/week: 0.0 standard drinks    Comment: rare  . Drug use: No  . Sexual activity: Not on file  Other Topics Concern  . Not on file  Social History Narrative   Lives w/ wife    Social Determinants of Health   Financial Resource Strain:   . Difficulty of Paying Living Expenses: Not on file  Food Insecurity:   . Worried  About Running Out of Food in the Last Year: Not on file  . Ran Out of Food in the Last Year: Not on file  Transportation Needs:   . Lack of Transportation (Medical): Not on file  . Lack of Transportation (Non-Medical): Not on file  Physical Activity:   . Days of Exercise per Week: Not on file  . Minutes of Exercise per Session: Not on file  Stress:   . Feeling of Stress : Not on file  Social Connections:   . Frequency of Communication with Friends and Family: Not on file  . Frequency of Social Gatherings with Friends and Family: Not on file    . Attends Religious Services: Not on file  . Active Member of Clubs or Organizations: Not on file  . Attends Banker Meetings: Not on file  . Marital Status: Not on file  Intimate Partner Violence:   . Fear of Current or Ex-Partner: Not on file  . Emotionally Abused: Not on file  . Physically Abused: Not on file  . Sexually Abused: Not on file      Allergies as of 01/06/2020   No Known Allergies     Medication List       Accurate as of January 06, 2020 11:39 AM. If you have any questions, ask your nurse or doctor.        acetaminophen 325 MG tablet Commonly known as: TYLENOL Take 2 tablets (650 mg total) by mouth every 6 (six) hours as needed for mild pain (or Fever >/= 101).   baclofen 10 MG tablet Commonly known as: LIORESAL Take 1 tablet (10 mg total) by mouth 3 (three) times daily.   FLUoxetine 20 MG capsule Commonly known as: PROZAC Take 1 capsule (20 mg total) by mouth at bedtime.   Ocrevus 300 MG/10ML injection Generic drug: ocrelizumab Inject 300 mg every 6 (six) months into the vein.   omeprazole 20 MG capsule Commonly known as: PRILOSEC Take 1 capsule (20 mg total) by mouth daily as needed (for acid reflex).   polyethylene glycol 17 g packet Commonly known as: MIRALAX / GLYCOLAX Take 17 g by mouth daily as needed for mild constipation or moderate constipation.   senna-docusate 8.6-50 MG tablet Commonly known as: Senokot-S Take 1 tablet by mouth at bedtime.           Objective:   Physical Exam There were no vitals taken for this visit. This is a virtual video visit, he is sitting in a wheelchair, he is alert, oriented x3, speech is consistent with MS and somewhat difficult to understand. His wife is seen today as well, she is very cooperative and caring.    Assessment       Assessment Multiple sclerosis, Dr Epimenio Foot  Decubitus ulcer on and off, buttock  Depression Recurrent UTIs, history of urolithiasis, BPH: Self cath Vitamin  D deficiency Thyroid nodules per ultrasound, benign biopsy 05/2018,  Plan: Multilobar pneumonia, recurrent UTI with exacerbation currently with an indwelling Foley, general debility, depression, atrial fibrillation transient, MS: The patient is recovering from multilobar pneumonia and respiratory hypoxic failure, status post in-house rehab, now at home under the care of his wife. The only medication he is taking currently is baclofen. He refuses fluoxetine, benefits of SSRIs discussed, he simply stated "I won't do it". He had an episode of abdominal pain and vomiting, see HPI, recent CT showed cholelithiasis, they understand what I can do through a virtual visit is very limited, last night he declined  his wife to call EMS because he does not want to go back to the hospital. Plan: Continue baclofen, stop fluoxetine per patient wishes. Good hydration Prescription for bentnyl and Zofran sent, to take as needed for nausea and abdominal pain He plans to keep his appointment with physical medicine and neurology. I encouraged him to accept the home health agency sending PT OT.  He states he will.      I discussed the assessment and treatment plan with the patient. The patient was provided an opportunity to ask questions and all were answered. The patient agreed with the plan and demonstrated an understanding of the instructions.   The patient was advised to call back or seek an in-person evaluation if the symptoms worsen or if the condition fails to improve as anticipated.

## 2020-01-06 NOTE — Telephone Encounter (Signed)
Meredith from [facility] wanting to chg her OT eval to next wk as pt is not happy about all of the commotion and wants to slow things down a bit for him to be more receptive CB at [phone]

## 2020-01-06 NOTE — Telephone Encounter (Signed)
Okay, noted

## 2020-01-06 NOTE — Telephone Encounter (Signed)
I just spoke with the patient, I encouraged him to accept help from PT and OT and he said he will.

## 2020-01-06 NOTE — Telephone Encounter (Signed)
Verbal orders given  

## 2020-01-06 NOTE — Telephone Encounter (Signed)
Patient has an appointment today for hospital follow up at 1120. Are you ok with verbal orders?

## 2020-01-07 ENCOUNTER — Telehealth: Payer: Self-pay | Admitting: Internal Medicine

## 2020-01-07 NOTE — Telephone Encounter (Signed)
Stephen Wade, from [facility], called and is requesting to move pts speech therapy until next week. Please advise .    [phone] secure line

## 2020-01-07 NOTE — Telephone Encounter (Signed)
Home Health Verbal Orders - Caller/AgencyGregary Signs Pt Magda Paganini Number: [phone] Requesting OT/PT/Skilled Nursing/Social Work/Speech Therapy: pt  Frequency: pt requested to move eval to next week  Week of 01/09/20  Call back for verbal

## 2020-01-07 NOTE — Telephone Encounter (Signed)
Verbal given to move to next week.

## 2020-01-07 NOTE — Telephone Encounter (Signed)
Gave verbal

## 2020-01-08 ENCOUNTER — Encounter: Payer: Self-pay | Admitting: Internal Medicine

## 2020-01-08 NOTE — Assessment & Plan Note (Signed)
Multilobar pneumonia, recurrent UTI with exacerbation currently with an indwelling Foley, general debility, depression, atrial fibrillation transient, MS: The patient is recovering from multilobar pneumonia and respiratory hypoxic failure, status post in-house rehab, now at home under the care of his wife. The only medication he is taking currently is baclofen. He refuses fluoxetine, benefits of SSRIs discussed, he simply stated "I won't do it". He had an episode of abdominal pain and vomiting, see HPI, recent CT showed cholelithiasis, they understand what I can do through a virtual visit is very limited, last night he declined his wife to call EMS because he does not want to go back to the hospital. Plan: Continue baclofen, stop fluoxetine per patient wishes. Good hydration Prescription for bentnyl and Zofran sent, to take as needed for nausea and abdominal pain He plans to keep his appointment with physical medicine and neurology. I encouraged him to accept the home health agency sending PT OT.  He states he will.

## 2020-01-10 DIAGNOSIS — I4891 Unspecified atrial fibrillation: Secondary | ICD-10-CM | POA: Diagnosis not present

## 2020-01-10 DIAGNOSIS — F329 Major depressive disorder, single episode, unspecified: Secondary | ICD-10-CM | POA: Diagnosis not present

## 2020-01-10 DIAGNOSIS — G35 Multiple sclerosis: Secondary | ICD-10-CM | POA: Diagnosis not present

## 2020-01-10 DIAGNOSIS — N319 Neuromuscular dysfunction of bladder, unspecified: Secondary | ICD-10-CM | POA: Diagnosis not present

## 2020-01-10 DIAGNOSIS — L89151 Pressure ulcer of sacral region, stage 1: Secondary | ICD-10-CM | POA: Diagnosis not present

## 2020-01-10 DIAGNOSIS — R1310 Dysphagia, unspecified: Secondary | ICD-10-CM | POA: Diagnosis not present

## 2020-01-11 ENCOUNTER — Telehealth: Payer: Self-pay | Admitting: Internal Medicine

## 2020-01-11 ENCOUNTER — Telehealth: Payer: Self-pay

## 2020-01-11 DIAGNOSIS — I4891 Unspecified atrial fibrillation: Secondary | ICD-10-CM | POA: Diagnosis not present

## 2020-01-11 DIAGNOSIS — N319 Neuromuscular dysfunction of bladder, unspecified: Secondary | ICD-10-CM | POA: Diagnosis not present

## 2020-01-11 DIAGNOSIS — G35 Multiple sclerosis: Secondary | ICD-10-CM | POA: Diagnosis not present

## 2020-01-11 DIAGNOSIS — L89151 Pressure ulcer of sacral region, stage 1: Secondary | ICD-10-CM | POA: Diagnosis not present

## 2020-01-11 DIAGNOSIS — R1310 Dysphagia, unspecified: Secondary | ICD-10-CM | POA: Diagnosis not present

## 2020-01-11 DIAGNOSIS — F329 Major depressive disorder, single episode, unspecified: Secondary | ICD-10-CM | POA: Diagnosis not present

## 2020-01-11 NOTE — Telephone Encounter (Signed)
Yes, I will support palliative care

## 2020-01-11 NOTE — Telephone Encounter (Signed)
Spoke w/ Jonette Eva, verbal given.

## 2020-01-11 NOTE — Telephone Encounter (Signed)
Please advise 

## 2020-01-11 NOTE — Telephone Encounter (Signed)
Spoke w/ Sharyl Nimrod- OT verbal orders given.

## 2020-01-11 NOTE — Telephone Encounter (Signed)
Caller/Agency: Colette Ribas with [facility] Callback Number: [phone] Requesting: Palliative Care (referral was sent by the hospital)  Please advise if [name] will support adding palliative care.

## 2020-01-11 NOTE — Telephone Encounter (Signed)
Stephen Wade w/[facility] called requesting verbal order regarding  plan of care contact  number [phone]

## 2020-01-12 ENCOUNTER — Telehealth: Payer: Self-pay | Admitting: Internal Medicine

## 2020-01-12 ENCOUNTER — Telehealth: Payer: Self-pay

## 2020-01-12 ENCOUNTER — Telehealth: Payer: Self-pay | Admitting: *Deleted

## 2020-01-12 NOTE — Telephone Encounter (Signed)
We can make a referral. Thanks.

## 2020-01-12 NOTE — Telephone Encounter (Signed)
Patients wife left a message stating that her husband has developed really bad sores on his buttocks. Patient's wife was told by the wound care center that the only way the patient can get in is from a physician referral. She is asking if Dr. Allena Katz would please provide a referral to wound care

## 2020-01-12 NOTE — Telephone Encounter (Signed)
Plan of care received, and multiple physician orders received, signed and faxed back to [facility] at [phone]. Forms sent for scanning.

## 2020-01-12 NOTE — Telephone Encounter (Signed)
Sean,PT with [facility], states called last week to get a verbal order for PT home health eval for week of 01/10/2020. [phone].

## 2020-01-12 NOTE — Telephone Encounter (Signed)
Multiple physician orders and plan of care faxed back to [facility] this morning.

## 2020-01-13 ENCOUNTER — Encounter: Payer: Self-pay | Admitting: Internal Medicine

## 2020-01-13 ENCOUNTER — Telehealth: Payer: Self-pay

## 2020-01-13 DIAGNOSIS — R338 Other retention of urine: Secondary | ICD-10-CM | POA: Diagnosis not present

## 2020-01-13 DIAGNOSIS — N3020 Other chronic cystitis without hematuria: Secondary | ICD-10-CM | POA: Diagnosis not present

## 2020-01-13 NOTE — Telephone Encounter (Signed)
Patient nurse Deirdre called in needing a  verbal prescription. The nurse is requesting that the speach therapy be put on Delay until next week. Please give the patient nurse a call at [phone] the nurse Deirdre stated that it is safe to leave her a voicemail if she does not answer thanks.

## 2020-01-13 NOTE — Telephone Encounter (Signed)
Error. See other telephone notes.

## 2020-01-13 NOTE — Telephone Encounter (Signed)
Wannetta Sender, Corrie Dandy from [facility] called stated she don't have the order for PT that you sent over yesterday morning. She is requesting a call back for a verbal. Please Thanks

## 2020-01-13 NOTE — Telephone Encounter (Signed)
Stephen Wade's call back number is  [phone].    Spoke w/ Stephen Wade- verbal order given.

## 2020-01-13 NOTE — Telephone Encounter (Signed)
Mary's call back number is  [phone].

## 2020-01-13 NOTE — Telephone Encounter (Signed)
Spoke w/ Deirdre- verbal orders given.

## 2020-01-14 ENCOUNTER — Telehealth: Payer: Self-pay | Admitting: Internal Medicine

## 2020-01-14 DIAGNOSIS — F329 Major depressive disorder, single episode, unspecified: Secondary | ICD-10-CM | POA: Diagnosis not present

## 2020-01-14 DIAGNOSIS — I4891 Unspecified atrial fibrillation: Secondary | ICD-10-CM | POA: Diagnosis not present

## 2020-01-14 DIAGNOSIS — R1310 Dysphagia, unspecified: Secondary | ICD-10-CM | POA: Diagnosis not present

## 2020-01-14 DIAGNOSIS — L89151 Pressure ulcer of sacral region, stage 1: Secondary | ICD-10-CM | POA: Diagnosis not present

## 2020-01-14 DIAGNOSIS — N319 Neuromuscular dysfunction of bladder, unspecified: Secondary | ICD-10-CM | POA: Diagnosis not present

## 2020-01-14 DIAGNOSIS — G35 Multiple sclerosis: Secondary | ICD-10-CM | POA: Diagnosis not present

## 2020-01-14 NOTE — Telephone Encounter (Signed)
Caller/Agency: Stephen Wade w/[facility] Callback Number: [phone] Requesting: Wound Care Frequency: 3x week for 4 weeks - wound cleanser/aquacell AG/Allevyn foam  Please call with verbal order

## 2020-01-14 NOTE — Telephone Encounter (Signed)
Agree, thank you

## 2020-01-14 NOTE — Telephone Encounter (Signed)
Please advise 

## 2020-01-14 NOTE — Telephone Encounter (Signed)
Spoke w/ Kathy-verbal order given.

## 2020-01-17 ENCOUNTER — Telehealth: Payer: Self-pay

## 2020-01-17 ENCOUNTER — Telehealth: Payer: Self-pay | Admitting: Internal Medicine

## 2020-01-17 DIAGNOSIS — I4891 Unspecified atrial fibrillation: Secondary | ICD-10-CM | POA: Diagnosis not present

## 2020-01-17 DIAGNOSIS — L89151 Pressure ulcer of sacral region, stage 1: Secondary | ICD-10-CM | POA: Diagnosis not present

## 2020-01-17 DIAGNOSIS — G35 Multiple sclerosis: Secondary | ICD-10-CM | POA: Diagnosis not present

## 2020-01-17 DIAGNOSIS — N319 Neuromuscular dysfunction of bladder, unspecified: Secondary | ICD-10-CM | POA: Diagnosis not present

## 2020-01-17 DIAGNOSIS — R1310 Dysphagia, unspecified: Secondary | ICD-10-CM | POA: Diagnosis not present

## 2020-01-17 DIAGNOSIS — F329 Major depressive disorder, single episode, unspecified: Secondary | ICD-10-CM | POA: Diagnosis not present

## 2020-01-17 NOTE — Telephone Encounter (Signed)
Caller/Agency: Gregary Signs w/[facility] Callback Number: [phone] Requesting OT/PT/Skilled Nursing/Social Work/Speech Therapy: PT Eval Frequency: pt wife has cancelled the past 2 weeks - Gregary Signs is requesting verbal order to have PT eval the week of 01/17/2020.

## 2020-01-17 NOTE — Telephone Encounter (Signed)
Spoke w/ Theora Gianotti- verbal orders given.

## 2020-01-17 NOTE — Telephone Encounter (Signed)
LMOM for [facility] w/ verbal orders.

## 2020-01-17 NOTE — Telephone Encounter (Signed)
Theora Gianotti from [facility] (Speech therapy) called in to get verbal order to treat the patient [name]. Please give Theora Gianotti a call back as soon as possible at [phone]. Theora Gianotti needs to treat the patient [name] twice a week.

## 2020-01-18 ENCOUNTER — Other Ambulatory Visit: Payer: Self-pay

## 2020-01-18 ENCOUNTER — Encounter: Payer: Medicare Other | Attending: Physical Medicine & Rehabilitation | Admitting: Physical Medicine & Rehabilitation

## 2020-01-18 ENCOUNTER — Encounter: Payer: Self-pay | Admitting: Physical Medicine & Rehabilitation

## 2020-01-18 VITALS — BP 106/68 | HR 89 | Temp 97.5°F

## 2020-01-18 DIAGNOSIS — R5381 Other malaise: Secondary | ICD-10-CM | POA: Insufficient documentation

## 2020-01-18 DIAGNOSIS — R1310 Dysphagia, unspecified: Secondary | ICD-10-CM | POA: Diagnosis not present

## 2020-01-18 DIAGNOSIS — K592 Neurogenic bowel, not elsewhere classified: Secondary | ICD-10-CM | POA: Diagnosis not present

## 2020-01-18 DIAGNOSIS — R269 Unspecified abnormalities of gait and mobility: Secondary | ICD-10-CM | POA: Diagnosis not present

## 2020-01-18 DIAGNOSIS — Z978 Presence of other specified devices: Secondary | ICD-10-CM | POA: Insufficient documentation

## 2020-01-18 DIAGNOSIS — N319 Neuromuscular dysfunction of bladder, unspecified: Secondary | ICD-10-CM | POA: Diagnosis not present

## 2020-01-18 NOTE — Progress Notes (Signed)
Subjective:    Patient ID: Stephen Wade, male    DOB: 1951/11/06, 69 y.o.   MRN: [id number]  HPI Right-handed male with history of multiple sclerosis followed by Dr. Epimenio Foot of neurology services, recurrent UTIs self catheterizations followed by [name], remote tobacco abuse presents for transitional care management after receiving  CIR for debility.  Admit date: 12/07/2019 Discharge date: 12/31/2019  Wife provides history due to cognition/processing. At discharge, he was instructed to follow up with Neuro, who he sees in 2 months. He saw PCP and Urology, with plans to continue indwelling foley. He enjoys being at home.  He is only taking Baclofen. Bowel movements have improved.  He has been advanced to regular diet for the most part.  Therapies: 2/week DME: Previously owned Mobility: Wheelchair at all times  Pain Inventory Average Pain 4 Pain Right Now 4 My pain is intermittent and burning  In the last 24 hours, has pain interfered with the following? General activity 4 Relation with others 4 Enjoyment of life 4 What TIME of day is your pain at its worst? all Sleep (in general) Good  Pain is worse with: . Pain improves with: . Relief from Meds: .  Mobility use a wheelchair needs help with transfers  Function I need assistance with the following:  bathing, toileting, meal prep, household duties and shopping  Neuro/Psych bladder control problems bowel control problems weakness tremor tingling trouble walking spasms  Prior Studies Any changes since last visit?  no  Physicians involved in your care Primary care [facility]   Family History  Problem Relation Age of Onset  . Heart attack Mother   . Brain cancer Father   . Colon cancer Neg Hx   . Prostate cancer Neg Hx    Social History   Socioeconomic History  . Marital status: Married    Spouse name: Not on file  . Number of children: 3  . Years of education: Not on file  . Highest  education level: Not on file  Occupational History  . Occupation: still works some from home    Employer: [employer]  Tobacco Use  . Smoking status: Former Games developer  . Smokeless tobacco: Never Used  . Tobacco comment: 3 ppd, quit 1999  Substance and Sexual Activity  . Alcohol use: No    Alcohol/week: 0.0 standard drinks    Comment: rare  . Drug use: No  . Sexual activity: Not on file  Other Topics Concern  . Not on file  Social History Narrative   Lives w/ wife    Social Determinants of Health   Financial Resource Strain:   . Difficulty of Paying Living Expenses: Not on file  Food Insecurity:   . Worried About Programme researcher, broadcasting/film/video in the Last Year: Not on file  . Ran Out of Food in the Last Year: Not on file  Transportation Needs:   . Lack of Transportation (Medical): Not on file  . Lack of Transportation (Non-Medical): Not on file  Physical Activity:   . Days of Exercise per Week: Not on file  . Minutes of Exercise per Session: Not on file  Stress:   . Feeling of Stress : Not on file  Social Connections:   . Frequency of Communication with Friends and Family: Not on file  . Frequency of Social Gatherings with Friends and Family: Not on file  . Attends Religious Services: Not on file  . Active Member of Clubs or Organizations: Not on file  .  Attends Archivist Meetings: Not on file  . Marital Status: Not on file   Past Surgical History:  Procedure Laterality Date  . APPENDECTOMY      ruptured 03-2010  . VASECTOMY     Past Medical History:  Diagnosis Date  . GERD (gastroesophageal reflux disease)    had on- off chest pain, symptoms resolved with Prilosec 11/2010  . Lower urinary tract symptoms (LUTS)    Dr Gaynelle Arabian  . MS (multiple sclerosis) ([place]) 2005  . Testicular hypogonadism   . Urge incontinence of urine   . Urolithiasis 2011   BP 106/68   Pulse 89   Temp (!) 97.5 F (36.4 C)   SpO2 96%   Opioid Risk Score:   Fall Risk Score:   `1  Depression screen PHQ 2/9  Depression screen PHQ 2/9 01/18/2020  Decreased Interest 1  Down, Depressed, Hopeless 1  PHQ - 2 Score 2  Altered sleeping 0  Tired, decreased energy 0  Change in appetite 0  Feeling bad or failure about yourself  0  Trouble concentrating 0  Moving slowly or fidgety/restless 0  Suicidal thoughts 0  PHQ-9 Score 2  Difficult doing work/chores Not difficult at all     Review of Systems  Constitutional: Positive for unexpected weight change.  Gastrointestinal: Positive for constipation.  Neurological: Positive for tremors and weakness.  All other systems reviewed and are negative.      Objective:   Physical Exam  Constitutional: No distress . Vital signs reviewed. HENT: Normocephalic.  Atraumatic. Eyes: EOMI. No discharge. Cardiovascular: No JVD. Respiratory: Normal effort.  No stridor. GI: Non-distended. Skin: Warm and dry.  Intact. Psych: Normal mood. Normal affect. Musc: Lower extremity edema improving GU: +Foley. Neuro: Alert Dysarthria, unchanged Motor:  Right lower extremity: 1/5 proximal distal Left lower extremity: Hip flexion, knee extension 1+/5, ankle dorsiflexion 1+/5 Increased tone bilateral lower extremities Right upper extremity ataxia    Assessment & Plan:  Right-handed male with history of multiple sclerosis followed by Dr. Felecia Shelling of neurology services, recurrent UTIs self catheterizations followed by Dr. Gaynelle Arabian, remote tobacco abuse presents for transitional care management after receiving  CIR for debility.  1.  Decreased functional mobility secondary to acute respiratory distress with hypoxic respiratory failure secondary to multi focal pneumonia in the setting of MS.    Cont therapies  Follow up with Neuro  2.  Atrial fibrillation   Rate controlled at present  3.  Neurogenic bowel bladder with recurrent UTIs.    Cont indwelling foley  Cont follow up with Urology  4.  Dysphagia.  Advanced to regular  diet/thins for the most part, some limitations with types of food remain, tolerating  5. Spasticity in setting of MS             Cont baclofen  6.  Neurogenic bowel             Improving  7. Gait abnormality  Cont therapies  Cont wheelchair for safety   Meds reviewed  Referrals reviewed All questions answered

## 2020-01-19 ENCOUNTER — Telehealth: Payer: Self-pay | Admitting: Internal Medicine

## 2020-01-19 DIAGNOSIS — G35 Multiple sclerosis: Secondary | ICD-10-CM | POA: Diagnosis not present

## 2020-01-19 DIAGNOSIS — R1310 Dysphagia, unspecified: Secondary | ICD-10-CM | POA: Diagnosis not present

## 2020-01-19 DIAGNOSIS — L89151 Pressure ulcer of sacral region, stage 1: Secondary | ICD-10-CM | POA: Diagnosis not present

## 2020-01-19 DIAGNOSIS — I4891 Unspecified atrial fibrillation: Secondary | ICD-10-CM | POA: Diagnosis not present

## 2020-01-19 DIAGNOSIS — F329 Major depressive disorder, single episode, unspecified: Secondary | ICD-10-CM | POA: Diagnosis not present

## 2020-01-19 DIAGNOSIS — N319 Neuromuscular dysfunction of bladder, unspecified: Secondary | ICD-10-CM | POA: Diagnosis not present

## 2020-01-19 NOTE — Telephone Encounter (Signed)
LMOM for Stephen Wade w/ verbal orders.  

## 2020-01-19 NOTE — Telephone Encounter (Signed)
Therapist saw patient for initial consult today. Would like an order for 2x a week for 7 weeks PT transfer training and therax and for balance training.

## 2020-01-20 DIAGNOSIS — G35 Multiple sclerosis: Secondary | ICD-10-CM | POA: Diagnosis not present

## 2020-01-20 DIAGNOSIS — F329 Major depressive disorder, single episode, unspecified: Secondary | ICD-10-CM | POA: Diagnosis not present

## 2020-01-20 DIAGNOSIS — R1310 Dysphagia, unspecified: Secondary | ICD-10-CM | POA: Diagnosis not present

## 2020-01-20 DIAGNOSIS — I4891 Unspecified atrial fibrillation: Secondary | ICD-10-CM | POA: Diagnosis not present

## 2020-01-20 DIAGNOSIS — N319 Neuromuscular dysfunction of bladder, unspecified: Secondary | ICD-10-CM | POA: Diagnosis not present

## 2020-01-20 DIAGNOSIS — L89151 Pressure ulcer of sacral region, stage 1: Secondary | ICD-10-CM | POA: Diagnosis not present

## 2020-01-21 ENCOUNTER — Encounter (HOSPITAL_BASED_OUTPATIENT_CLINIC_OR_DEPARTMENT_OTHER): Payer: Medicare Other | Admitting: Internal Medicine

## 2020-01-21 DIAGNOSIS — G35 Multiple sclerosis: Secondary | ICD-10-CM | POA: Diagnosis not present

## 2020-01-21 DIAGNOSIS — F329 Major depressive disorder, single episode, unspecified: Secondary | ICD-10-CM | POA: Diagnosis not present

## 2020-01-21 DIAGNOSIS — N319 Neuromuscular dysfunction of bladder, unspecified: Secondary | ICD-10-CM | POA: Diagnosis not present

## 2020-01-21 DIAGNOSIS — I4891 Unspecified atrial fibrillation: Secondary | ICD-10-CM | POA: Diagnosis not present

## 2020-01-21 DIAGNOSIS — R1310 Dysphagia, unspecified: Secondary | ICD-10-CM | POA: Diagnosis not present

## 2020-01-21 DIAGNOSIS — L89151 Pressure ulcer of sacral region, stage 1: Secondary | ICD-10-CM | POA: Diagnosis not present

## 2020-01-24 ENCOUNTER — Telehealth: Payer: Self-pay

## 2020-01-24 ENCOUNTER — Telehealth: Payer: Self-pay | Admitting: Internal Medicine

## 2020-01-24 DIAGNOSIS — N319 Neuromuscular dysfunction of bladder, unspecified: Secondary | ICD-10-CM | POA: Diagnosis not present

## 2020-01-24 DIAGNOSIS — G35 Multiple sclerosis: Secondary | ICD-10-CM | POA: Diagnosis not present

## 2020-01-24 DIAGNOSIS — F329 Major depressive disorder, single episode, unspecified: Secondary | ICD-10-CM | POA: Diagnosis not present

## 2020-01-24 DIAGNOSIS — I4891 Unspecified atrial fibrillation: Secondary | ICD-10-CM | POA: Diagnosis not present

## 2020-01-24 DIAGNOSIS — R1310 Dysphagia, unspecified: Secondary | ICD-10-CM | POA: Diagnosis not present

## 2020-01-24 DIAGNOSIS — L89151 Pressure ulcer of sacral region, stage 1: Secondary | ICD-10-CM | POA: Diagnosis not present

## 2020-01-24 NOTE — Telephone Encounter (Signed)
Just an FYI

## 2020-01-24 NOTE — Telephone Encounter (Signed)
Thank you, noted.

## 2020-01-24 NOTE — Telephone Encounter (Signed)
Update: [facility] enrolled Stephen Wade on 1/27. He is also getting in home health thru [facility] PT/OT/ST. [facility] will start to see pt as well.   If anything needed call Colette Ribas ph# [phone]

## 2020-01-24 NOTE — Telephone Encounter (Signed)
Multiple physician orders received from [facility]. Forms signed and faxed back to [phone]. Forms sent for scanning.

## 2020-01-25 ENCOUNTER — Telehealth: Payer: Self-pay | Admitting: Internal Medicine

## 2020-01-25 DIAGNOSIS — R1310 Dysphagia, unspecified: Secondary | ICD-10-CM | POA: Diagnosis not present

## 2020-01-25 DIAGNOSIS — I4891 Unspecified atrial fibrillation: Secondary | ICD-10-CM | POA: Diagnosis not present

## 2020-01-25 DIAGNOSIS — G35 Multiple sclerosis: Secondary | ICD-10-CM | POA: Diagnosis not present

## 2020-01-25 DIAGNOSIS — F329 Major depressive disorder, single episode, unspecified: Secondary | ICD-10-CM | POA: Diagnosis not present

## 2020-01-25 DIAGNOSIS — N319 Neuromuscular dysfunction of bladder, unspecified: Secondary | ICD-10-CM | POA: Diagnosis not present

## 2020-01-25 DIAGNOSIS — L89151 Pressure ulcer of sacral region, stage 1: Secondary | ICD-10-CM | POA: Diagnosis not present

## 2020-01-25 NOTE — Telephone Encounter (Signed)
Stephen Wade with Home Health would like   Verbal orders for patient to continue to have home health care for OT to come in once a week for 4weeks starting 02/07/2020

## 2020-01-26 DIAGNOSIS — L89151 Pressure ulcer of sacral region, stage 1: Secondary | ICD-10-CM | POA: Diagnosis not present

## 2020-01-26 DIAGNOSIS — R1310 Dysphagia, unspecified: Secondary | ICD-10-CM | POA: Diagnosis not present

## 2020-01-26 DIAGNOSIS — F329 Major depressive disorder, single episode, unspecified: Secondary | ICD-10-CM | POA: Diagnosis not present

## 2020-01-26 DIAGNOSIS — N319 Neuromuscular dysfunction of bladder, unspecified: Secondary | ICD-10-CM | POA: Diagnosis not present

## 2020-01-26 DIAGNOSIS — I4891 Unspecified atrial fibrillation: Secondary | ICD-10-CM | POA: Diagnosis not present

## 2020-01-26 DIAGNOSIS — G35 Multiple sclerosis: Secondary | ICD-10-CM | POA: Diagnosis not present

## 2020-01-26 NOTE — Telephone Encounter (Signed)
Spoke w/ Meredith- verbal orders given.  

## 2020-01-27 ENCOUNTER — Telehealth: Payer: Self-pay | Admitting: Internal Medicine

## 2020-01-27 DIAGNOSIS — F329 Major depressive disorder, single episode, unspecified: Secondary | ICD-10-CM | POA: Diagnosis not present

## 2020-01-27 DIAGNOSIS — R1310 Dysphagia, unspecified: Secondary | ICD-10-CM | POA: Diagnosis not present

## 2020-01-27 DIAGNOSIS — L89151 Pressure ulcer of sacral region, stage 1: Secondary | ICD-10-CM | POA: Diagnosis not present

## 2020-01-27 DIAGNOSIS — N319 Neuromuscular dysfunction of bladder, unspecified: Secondary | ICD-10-CM | POA: Diagnosis not present

## 2020-01-27 DIAGNOSIS — G35 Multiple sclerosis: Secondary | ICD-10-CM | POA: Diagnosis not present

## 2020-01-27 DIAGNOSIS — I4891 Unspecified atrial fibrillation: Secondary | ICD-10-CM | POA: Diagnosis not present

## 2020-01-27 NOTE — Telephone Encounter (Signed)
Lorie- PT w/ [facility] calling to inform that Pt and caregiver wishing to cancel visit today and move to next week- verbal given.

## 2020-01-27 NOTE — Telephone Encounter (Signed)
LMOM w/ verbal orders.  

## 2020-01-27 NOTE — Telephone Encounter (Signed)
Per [facility], patient's order need to be changed  To being seen twice a  week for 2weeks.  Verbal order

## 2020-01-28 DIAGNOSIS — I4891 Unspecified atrial fibrillation: Secondary | ICD-10-CM | POA: Diagnosis not present

## 2020-01-28 DIAGNOSIS — N319 Neuromuscular dysfunction of bladder, unspecified: Secondary | ICD-10-CM | POA: Diagnosis not present

## 2020-01-28 DIAGNOSIS — R1310 Dysphagia, unspecified: Secondary | ICD-10-CM | POA: Diagnosis not present

## 2020-01-28 DIAGNOSIS — F329 Major depressive disorder, single episode, unspecified: Secondary | ICD-10-CM | POA: Diagnosis not present

## 2020-01-28 DIAGNOSIS — L89151 Pressure ulcer of sacral region, stage 1: Secondary | ICD-10-CM | POA: Diagnosis not present

## 2020-01-28 DIAGNOSIS — G35 Multiple sclerosis: Secondary | ICD-10-CM | POA: Diagnosis not present

## 2020-01-31 ENCOUNTER — Telehealth: Payer: Self-pay | Admitting: Neurology

## 2020-01-31 ENCOUNTER — Encounter (HOSPITAL_BASED_OUTPATIENT_CLINIC_OR_DEPARTMENT_OTHER): Payer: Medicare Other | Admitting: Internal Medicine

## 2020-01-31 DIAGNOSIS — L89151 Pressure ulcer of sacral region, stage 1: Secondary | ICD-10-CM | POA: Diagnosis not present

## 2020-01-31 DIAGNOSIS — I4891 Unspecified atrial fibrillation: Secondary | ICD-10-CM | POA: Diagnosis not present

## 2020-01-31 DIAGNOSIS — G35 Multiple sclerosis: Secondary | ICD-10-CM | POA: Diagnosis not present

## 2020-01-31 DIAGNOSIS — F329 Major depressive disorder, single episode, unspecified: Secondary | ICD-10-CM | POA: Diagnosis not present

## 2020-01-31 DIAGNOSIS — R1310 Dysphagia, unspecified: Secondary | ICD-10-CM | POA: Diagnosis not present

## 2020-01-31 DIAGNOSIS — N319 Neuromuscular dysfunction of bladder, unspecified: Secondary | ICD-10-CM | POA: Diagnosis not present

## 2020-01-31 NOTE — Telephone Encounter (Signed)
Called Stephen Wade back. He just got out of the hospital for UTI. Was there for 5 weeks. Advised he should also follow up with PCP and urologist to make sure rx have resolved. She reports he is very lethargic and urine dark still. He is trying to increase water intake. She will have him f/u with urologist to make sure no active infection is going on.   I relayed per Dr. Epimenio Foot that he is ok to get either Pfizer or Gala Murdoch covid-19 vaccine but he should make sure his other sx are cleared up prior to getting vaccine. She verbalized understanding.

## 2020-01-31 NOTE — Telephone Encounter (Signed)
Patients wife Verlon Au is calling in wanting to know if it is safe for patient to receive COVID Vaccine

## 2020-02-01 ENCOUNTER — Telehealth: Payer: Self-pay | Admitting: Internal Medicine

## 2020-02-01 DIAGNOSIS — I4891 Unspecified atrial fibrillation: Secondary | ICD-10-CM | POA: Diagnosis not present

## 2020-02-01 DIAGNOSIS — N319 Neuromuscular dysfunction of bladder, unspecified: Secondary | ICD-10-CM | POA: Diagnosis not present

## 2020-02-01 DIAGNOSIS — F329 Major depressive disorder, single episode, unspecified: Secondary | ICD-10-CM | POA: Diagnosis not present

## 2020-02-01 DIAGNOSIS — N390 Urinary tract infection, site not specified: Secondary | ICD-10-CM | POA: Diagnosis not present

## 2020-02-01 DIAGNOSIS — K8050 Calculus of bile duct without cholangitis or cholecystitis without obstruction: Secondary | ICD-10-CM

## 2020-02-01 DIAGNOSIS — N400 Enlarged prostate without lower urinary tract symptoms: Secondary | ICD-10-CM | POA: Diagnosis not present

## 2020-02-01 DIAGNOSIS — G35 Multiple sclerosis: Secondary | ICD-10-CM | POA: Diagnosis not present

## 2020-02-01 DIAGNOSIS — K831 Obstruction of bile duct: Secondary | ICD-10-CM | POA: Diagnosis not present

## 2020-02-01 DIAGNOSIS — L89151 Pressure ulcer of sacral region, stage 1: Secondary | ICD-10-CM | POA: Diagnosis not present

## 2020-02-01 DIAGNOSIS — R1310 Dysphagia, unspecified: Secondary | ICD-10-CM | POA: Diagnosis not present

## 2020-02-01 LAB — CBC AND DIFFERENTIAL
HCT: 37 — AB (ref 41–53)
Hemoglobin: 12.7 — AB (ref 13.5–17.5)
Neutrophils Absolute: 6467
Platelets: 308 (ref 150–399)
WBC: 8.6

## 2020-02-01 LAB — BASIC METABOLIC PANEL
BUN: 20 (ref 4–21)
CO2: 24 — AB (ref 13–22)
Chloride: 105 (ref 99–108)
Creatinine: 0.6 (ref 0.6–1.3)
Glucose: 139
Potassium: 3.8 (ref 3.4–5.3)
Sodium: 137 (ref 137–147)

## 2020-02-01 LAB — COMPREHENSIVE METABOLIC PANEL
Albumin: 3.8 (ref 3.5–5.0)
Calcium: 9.1 (ref 8.7–10.7)
GFR calc Af Amer: 118
GFR calc non Af Amer: 102
Globulin: 2.6

## 2020-02-01 LAB — HEPATIC FUNCTION PANEL
ALT: 134 — AB (ref 10–40)
AST: 69 — AB (ref 14–40)
Alkaline Phosphatase: 375 — AB (ref 25–125)
Bilirubin, Total: 1.7

## 2020-02-01 LAB — CBC: RBC: 4.27 (ref 3.87–5.11)

## 2020-02-01 NOTE — Telephone Encounter (Signed)
Raynelle Fanning calling form [facility] stating [facility] called her stating Mr Lampkins has dark urine & pale stool. She needs to know what labs needs to be done. She's headed out to the patients home today. Please Advise

## 2020-02-01 NOTE — Telephone Encounter (Signed)
Could be many things, first thing that  comes to mind is a biliary obstruction. If he has fever, chills, abdominal pain, looks pale or in distress, needs to be seen . Otherwise get a CMP, CBC, UA, urine culture. DX: Jaundice

## 2020-02-01 NOTE — Telephone Encounter (Signed)
Spoke w/ Stephen Wade- she received voicemail from [facility] this morning regarding symptoms- she is going to see Pt today at 12pm- informed of PCP recommendations and orders. Stephen Wade verbalized understanding. Lab results to be faxed to [place] at [phone].

## 2020-02-02 DIAGNOSIS — I4891 Unspecified atrial fibrillation: Secondary | ICD-10-CM | POA: Diagnosis not present

## 2020-02-02 DIAGNOSIS — R1310 Dysphagia, unspecified: Secondary | ICD-10-CM | POA: Diagnosis not present

## 2020-02-02 DIAGNOSIS — F329 Major depressive disorder, single episode, unspecified: Secondary | ICD-10-CM | POA: Diagnosis not present

## 2020-02-02 DIAGNOSIS — N319 Neuromuscular dysfunction of bladder, unspecified: Secondary | ICD-10-CM | POA: Diagnosis not present

## 2020-02-02 DIAGNOSIS — G35 Multiple sclerosis: Secondary | ICD-10-CM | POA: Diagnosis not present

## 2020-02-02 DIAGNOSIS — L89151 Pressure ulcer of sacral region, stage 1: Secondary | ICD-10-CM | POA: Diagnosis not present

## 2020-02-02 NOTE — Telephone Encounter (Signed)
Labs abstracted and sent for scanning.  

## 2020-02-02 NOTE — Telephone Encounter (Signed)
Labs from 02/01/2020: Blood sugar 139, creatinine 0.3, potassium 3.8. alkaline phosphate 375, AST 69, ALT 134. CBC normal except for hemoglobin of 12.7 slightly low. Urine test show nitrates, WBCs.  Many bacteria.  Spoke with the patient and his wife: About 4 days ago had  pale stools, dark urine. Had abdominal pain, nausea vomiting x 2 . No fever chills  Since then he is feeling better and today reportedly he feels great. Has eating without any problems.  Most likely he has a biliary obstruction (resolving?).  CT of the chest done during recent admission showed Cholelithiasis.  Plan: Prompt outpatient surgical evaluation ER if fever, chills or GI symptoms return They understand the potential seriousness of a biliary obstruction.

## 2020-02-02 NOTE — Telephone Encounter (Signed)
Referral placed urgently to [facility].

## 2020-02-02 NOTE — Telephone Encounter (Signed)
Labs received from [facility]. Urine culture pending. Placed in PCP red folder for review.

## 2020-02-03 DIAGNOSIS — L89151 Pressure ulcer of sacral region, stage 1: Secondary | ICD-10-CM | POA: Diagnosis not present

## 2020-02-03 DIAGNOSIS — Z87440 Personal history of urinary (tract) infections: Secondary | ICD-10-CM | POA: Diagnosis not present

## 2020-02-03 DIAGNOSIS — R1310 Dysphagia, unspecified: Secondary | ICD-10-CM | POA: Diagnosis not present

## 2020-02-03 DIAGNOSIS — E46 Unspecified protein-calorie malnutrition: Secondary | ICD-10-CM | POA: Diagnosis not present

## 2020-02-03 DIAGNOSIS — G35 Multiple sclerosis: Secondary | ICD-10-CM | POA: Diagnosis not present

## 2020-02-03 DIAGNOSIS — Z8701 Personal history of pneumonia (recurrent): Secondary | ICD-10-CM | POA: Diagnosis not present

## 2020-02-03 DIAGNOSIS — R5381 Other malaise: Secondary | ICD-10-CM | POA: Diagnosis not present

## 2020-02-03 DIAGNOSIS — I4891 Unspecified atrial fibrillation: Secondary | ICD-10-CM | POA: Diagnosis not present

## 2020-02-03 DIAGNOSIS — F329 Major depressive disorder, single episode, unspecified: Secondary | ICD-10-CM | POA: Diagnosis not present

## 2020-02-03 DIAGNOSIS — Z466 Encounter for fitting and adjustment of urinary device: Secondary | ICD-10-CM | POA: Diagnosis not present

## 2020-02-03 DIAGNOSIS — N319 Neuromuscular dysfunction of bladder, unspecified: Secondary | ICD-10-CM | POA: Diagnosis not present

## 2020-02-04 DIAGNOSIS — L89151 Pressure ulcer of sacral region, stage 1: Secondary | ICD-10-CM | POA: Diagnosis not present

## 2020-02-04 DIAGNOSIS — R1310 Dysphagia, unspecified: Secondary | ICD-10-CM | POA: Diagnosis not present

## 2020-02-04 DIAGNOSIS — I4891 Unspecified atrial fibrillation: Secondary | ICD-10-CM | POA: Diagnosis not present

## 2020-02-04 DIAGNOSIS — N319 Neuromuscular dysfunction of bladder, unspecified: Secondary | ICD-10-CM | POA: Diagnosis not present

## 2020-02-04 DIAGNOSIS — F329 Major depressive disorder, single episode, unspecified: Secondary | ICD-10-CM | POA: Diagnosis not present

## 2020-02-04 DIAGNOSIS — G35 Multiple sclerosis: Secondary | ICD-10-CM | POA: Diagnosis not present

## 2020-02-04 NOTE — Telephone Encounter (Signed)
Addendum: Urine culture likely contaminated.

## 2020-02-07 DIAGNOSIS — G35 Multiple sclerosis: Secondary | ICD-10-CM | POA: Diagnosis not present

## 2020-02-07 DIAGNOSIS — L89151 Pressure ulcer of sacral region, stage 1: Secondary | ICD-10-CM | POA: Diagnosis not present

## 2020-02-07 DIAGNOSIS — R1310 Dysphagia, unspecified: Secondary | ICD-10-CM | POA: Diagnosis not present

## 2020-02-07 DIAGNOSIS — I4891 Unspecified atrial fibrillation: Secondary | ICD-10-CM | POA: Diagnosis not present

## 2020-02-07 DIAGNOSIS — F329 Major depressive disorder, single episode, unspecified: Secondary | ICD-10-CM | POA: Diagnosis not present

## 2020-02-07 DIAGNOSIS — N319 Neuromuscular dysfunction of bladder, unspecified: Secondary | ICD-10-CM | POA: Diagnosis not present

## 2020-02-08 DIAGNOSIS — R1310 Dysphagia, unspecified: Secondary | ICD-10-CM | POA: Diagnosis not present

## 2020-02-08 DIAGNOSIS — I4891 Unspecified atrial fibrillation: Secondary | ICD-10-CM | POA: Diagnosis not present

## 2020-02-08 DIAGNOSIS — N319 Neuromuscular dysfunction of bladder, unspecified: Secondary | ICD-10-CM | POA: Diagnosis not present

## 2020-02-08 DIAGNOSIS — F329 Major depressive disorder, single episode, unspecified: Secondary | ICD-10-CM | POA: Diagnosis not present

## 2020-02-08 DIAGNOSIS — L89151 Pressure ulcer of sacral region, stage 1: Secondary | ICD-10-CM | POA: Diagnosis not present

## 2020-02-08 DIAGNOSIS — G35 Multiple sclerosis: Secondary | ICD-10-CM | POA: Diagnosis not present

## 2020-02-09 DIAGNOSIS — R1310 Dysphagia, unspecified: Secondary | ICD-10-CM | POA: Diagnosis not present

## 2020-02-09 DIAGNOSIS — N319 Neuromuscular dysfunction of bladder, unspecified: Secondary | ICD-10-CM | POA: Diagnosis not present

## 2020-02-09 DIAGNOSIS — L89151 Pressure ulcer of sacral region, stage 1: Secondary | ICD-10-CM | POA: Diagnosis not present

## 2020-02-09 DIAGNOSIS — G35 Multiple sclerosis: Secondary | ICD-10-CM | POA: Diagnosis not present

## 2020-02-09 DIAGNOSIS — F329 Major depressive disorder, single episode, unspecified: Secondary | ICD-10-CM | POA: Diagnosis not present

## 2020-02-09 DIAGNOSIS — I4891 Unspecified atrial fibrillation: Secondary | ICD-10-CM | POA: Diagnosis not present

## 2020-02-11 DIAGNOSIS — F329 Major depressive disorder, single episode, unspecified: Secondary | ICD-10-CM | POA: Diagnosis not present

## 2020-02-11 DIAGNOSIS — L89151 Pressure ulcer of sacral region, stage 1: Secondary | ICD-10-CM | POA: Diagnosis not present

## 2020-02-11 DIAGNOSIS — G35 Multiple sclerosis: Secondary | ICD-10-CM | POA: Diagnosis not present

## 2020-02-11 DIAGNOSIS — N319 Neuromuscular dysfunction of bladder, unspecified: Secondary | ICD-10-CM | POA: Diagnosis not present

## 2020-02-11 DIAGNOSIS — I4891 Unspecified atrial fibrillation: Secondary | ICD-10-CM | POA: Diagnosis not present

## 2020-02-11 DIAGNOSIS — R1310 Dysphagia, unspecified: Secondary | ICD-10-CM | POA: Diagnosis not present

## 2020-02-15 ENCOUNTER — Other Ambulatory Visit: Payer: Self-pay | Admitting: Surgery

## 2020-02-15 DIAGNOSIS — K8020 Calculus of gallbladder without cholecystitis without obstruction: Secondary | ICD-10-CM | POA: Diagnosis not present

## 2020-02-16 ENCOUNTER — Telehealth: Payer: Self-pay | Admitting: Internal Medicine

## 2020-02-16 DIAGNOSIS — N319 Neuromuscular dysfunction of bladder, unspecified: Secondary | ICD-10-CM | POA: Diagnosis not present

## 2020-02-16 DIAGNOSIS — F329 Major depressive disorder, single episode, unspecified: Secondary | ICD-10-CM | POA: Diagnosis not present

## 2020-02-16 DIAGNOSIS — G35 Multiple sclerosis: Secondary | ICD-10-CM | POA: Diagnosis not present

## 2020-02-16 DIAGNOSIS — L89151 Pressure ulcer of sacral region, stage 1: Secondary | ICD-10-CM | POA: Diagnosis not present

## 2020-02-16 DIAGNOSIS — R1310 Dysphagia, unspecified: Secondary | ICD-10-CM | POA: Diagnosis not present

## 2020-02-16 DIAGNOSIS — I4891 Unspecified atrial fibrillation: Secondary | ICD-10-CM | POA: Diagnosis not present

## 2020-02-16 NOTE — Telephone Encounter (Signed)
He is home, sorry- this is for home health orders to d/c early.

## 2020-02-16 NOTE — Telephone Encounter (Signed)
Please clarify, is he at home or at [facility] living?

## 2020-02-16 NOTE — Telephone Encounter (Signed)
Per Ines Bloomer with [facility], patient is requesting a really discharge.   Just FYI per [employer]

## 2020-02-16 NOTE — Telephone Encounter (Signed)
LMOM for Stephen Wade w/ verbal orders.

## 2020-02-16 NOTE — Telephone Encounter (Signed)
Okay to DC early if patient is unwilling to continue with the program

## 2020-02-16 NOTE — Telephone Encounter (Signed)
Please advise 

## 2020-02-17 ENCOUNTER — Telehealth: Payer: Self-pay

## 2020-02-17 DIAGNOSIS — G35 Multiple sclerosis: Secondary | ICD-10-CM | POA: Diagnosis not present

## 2020-02-17 DIAGNOSIS — N319 Neuromuscular dysfunction of bladder, unspecified: Secondary | ICD-10-CM | POA: Diagnosis not present

## 2020-02-17 DIAGNOSIS — I4891 Unspecified atrial fibrillation: Secondary | ICD-10-CM | POA: Diagnosis not present

## 2020-02-17 DIAGNOSIS — L89151 Pressure ulcer of sacral region, stage 1: Secondary | ICD-10-CM | POA: Diagnosis not present

## 2020-02-17 DIAGNOSIS — F329 Major depressive disorder, single episode, unspecified: Secondary | ICD-10-CM | POA: Diagnosis not present

## 2020-02-17 DIAGNOSIS — R1310 Dysphagia, unspecified: Secondary | ICD-10-CM | POA: Diagnosis not present

## 2020-02-17 NOTE — Telephone Encounter (Signed)
Spoke w/ Sharyl Nimrod- okay to d/c OT home health early.

## 2020-02-17 NOTE — Telephone Encounter (Signed)
Vic Blackbird called from [facility] to get a verbal order to discharge the patient today Per the patient request.   Please give Sharyl Nimrod a call back at [phone]  Thanks,

## 2020-02-18 DIAGNOSIS — I4891 Unspecified atrial fibrillation: Secondary | ICD-10-CM | POA: Diagnosis not present

## 2020-02-18 DIAGNOSIS — R1310 Dysphagia, unspecified: Secondary | ICD-10-CM | POA: Diagnosis not present

## 2020-02-18 DIAGNOSIS — L89151 Pressure ulcer of sacral region, stage 1: Secondary | ICD-10-CM | POA: Diagnosis not present

## 2020-02-18 DIAGNOSIS — F329 Major depressive disorder, single episode, unspecified: Secondary | ICD-10-CM | POA: Diagnosis not present

## 2020-02-18 DIAGNOSIS — G35 Multiple sclerosis: Secondary | ICD-10-CM | POA: Diagnosis not present

## 2020-02-18 DIAGNOSIS — N319 Neuromuscular dysfunction of bladder, unspecified: Secondary | ICD-10-CM | POA: Diagnosis not present

## 2020-02-21 DIAGNOSIS — Z23 Encounter for immunization: Secondary | ICD-10-CM | POA: Diagnosis not present

## 2020-02-23 ENCOUNTER — Telehealth: Payer: Self-pay | Admitting: Internal Medicine

## 2020-02-23 ENCOUNTER — Telehealth: Payer: Self-pay

## 2020-02-23 NOTE — Telephone Encounter (Signed)
FYI

## 2020-02-23 NOTE — Telephone Encounter (Signed)
noted 

## 2020-02-23 NOTE — Telephone Encounter (Signed)
Spoke w/ Nedra Hai- verbal orders given.

## 2020-02-23 NOTE — Telephone Encounter (Signed)
Tried calling Nedra Hai back- her son actually answered her phone informing she isn't available- no further information given other than my name and asking for a call back.

## 2020-02-23 NOTE — Telephone Encounter (Signed)
Patient's wife refused Skill Nursing for this week, wound is healed per wife.  Nedra Hai with [facility] is going to try  back next week to monitor the wound.

## 2020-02-23 NOTE — Telephone Encounter (Signed)
Nedra Hai from [facility] called in to get verbal orders from [name] for canceling 2 visit for this week per the patients request. Please follow up with Nedra Hai at [phone] thanks,

## 2020-02-25 ENCOUNTER — Telehealth: Payer: Self-pay

## 2020-02-25 NOTE — Telephone Encounter (Signed)
Plan of care from [facility] received. Form signed and mailed back in envelope provided. Copy of forms sent for scanning.

## 2020-03-02 ENCOUNTER — Telehealth: Payer: Self-pay

## 2020-03-02 DIAGNOSIS — F329 Major depressive disorder, single episode, unspecified: Secondary | ICD-10-CM | POA: Diagnosis not present

## 2020-03-02 DIAGNOSIS — R1310 Dysphagia, unspecified: Secondary | ICD-10-CM | POA: Diagnosis not present

## 2020-03-02 DIAGNOSIS — G35 Multiple sclerosis: Secondary | ICD-10-CM | POA: Diagnosis not present

## 2020-03-02 DIAGNOSIS — N319 Neuromuscular dysfunction of bladder, unspecified: Secondary | ICD-10-CM | POA: Diagnosis not present

## 2020-03-02 DIAGNOSIS — L89151 Pressure ulcer of sacral region, stage 1: Secondary | ICD-10-CM | POA: Diagnosis not present

## 2020-03-02 DIAGNOSIS — I4891 Unspecified atrial fibrillation: Secondary | ICD-10-CM | POA: Diagnosis not present

## 2020-03-02 NOTE — Telephone Encounter (Signed)
Received message from after hours call center. Message states "patient has a burning sensation and can not urinate. Needs an order to change his catheter."  Call center attempted to reach on call Dr. 4 separate times with no response.   I have called stephanie with [facility] to see if she still needs an order as we can get this to her asap, or if she does not need anything to disregard. I have left detailed message with call back number.

## 2020-03-02 NOTE — Telephone Encounter (Signed)
Judeth Cornfield called back, stated she no longer needed an order for his catheter to be changed. She states she waited an hour for Dr. Shirlee Latch on the phone with no answer. She finally reached another doctor to obtain the order. She explained unacceptable this was to not be answered as the patient was in pain. I apologized to her and assured she had everything she needed.

## 2020-03-04 DIAGNOSIS — Z466 Encounter for fitting and adjustment of urinary device: Secondary | ICD-10-CM | POA: Diagnosis not present

## 2020-03-04 DIAGNOSIS — Z87440 Personal history of urinary (tract) infections: Secondary | ICD-10-CM | POA: Diagnosis not present

## 2020-03-04 DIAGNOSIS — Z8701 Personal history of pneumonia (recurrent): Secondary | ICD-10-CM | POA: Diagnosis not present

## 2020-03-04 DIAGNOSIS — F329 Major depressive disorder, single episode, unspecified: Secondary | ICD-10-CM | POA: Diagnosis not present

## 2020-03-04 DIAGNOSIS — G35 Multiple sclerosis: Secondary | ICD-10-CM | POA: Diagnosis not present

## 2020-03-04 DIAGNOSIS — R5381 Other malaise: Secondary | ICD-10-CM | POA: Diagnosis not present

## 2020-03-04 DIAGNOSIS — N319 Neuromuscular dysfunction of bladder, unspecified: Secondary | ICD-10-CM | POA: Diagnosis not present

## 2020-03-04 DIAGNOSIS — E46 Unspecified protein-calorie malnutrition: Secondary | ICD-10-CM | POA: Diagnosis not present

## 2020-03-04 DIAGNOSIS — I4891 Unspecified atrial fibrillation: Secondary | ICD-10-CM | POA: Diagnosis not present

## 2020-03-07 ENCOUNTER — Telehealth: Payer: Self-pay | Admitting: Internal Medicine

## 2020-03-07 DIAGNOSIS — I4891 Unspecified atrial fibrillation: Secondary | ICD-10-CM | POA: Diagnosis not present

## 2020-03-07 DIAGNOSIS — E46 Unspecified protein-calorie malnutrition: Secondary | ICD-10-CM | POA: Diagnosis not present

## 2020-03-07 DIAGNOSIS — N319 Neuromuscular dysfunction of bladder, unspecified: Secondary | ICD-10-CM | POA: Diagnosis not present

## 2020-03-07 DIAGNOSIS — F329 Major depressive disorder, single episode, unspecified: Secondary | ICD-10-CM | POA: Diagnosis not present

## 2020-03-07 DIAGNOSIS — Z466 Encounter for fitting and adjustment of urinary device: Secondary | ICD-10-CM | POA: Diagnosis not present

## 2020-03-07 DIAGNOSIS — G35 Multiple sclerosis: Secondary | ICD-10-CM | POA: Diagnosis not present

## 2020-03-07 NOTE — Telephone Encounter (Signed)
Okay to provide orders as above.  Thank you

## 2020-03-07 NOTE — Telephone Encounter (Signed)
No number or name given for callback- Pt w/ [facility]- called them at [phone]- informed we received request for orders last night however no name/call back number given-was transferred to [facility], [employer]- I left this information and verbals on voicemail- however informed her to call back if questions/concerns.

## 2020-03-07 NOTE — Telephone Encounter (Signed)
Hospice/palliative care pt due to MS called d/t burning and unable to urinate by hospice home health nuse though has [facility]  800 cc urinary retention and hospice RN changed cath with clear sediment  Has had cath since 11/2019  She is asking  -family be educated on urinary catheter use and changing and how to flush  -periodically flush bladder cath daily to 2x per week    TMS

## 2020-03-08 ENCOUNTER — Encounter: Payer: Self-pay | Admitting: Neurology

## 2020-03-08 ENCOUNTER — Ambulatory Visit (INDEPENDENT_AMBULATORY_CARE_PROVIDER_SITE_OTHER): Payer: Medicare Other | Admitting: Neurology

## 2020-03-08 ENCOUNTER — Other Ambulatory Visit: Payer: Self-pay

## 2020-03-08 VITALS — BP 109/61 | HR 62 | Temp 95.9°F | Ht 72.0 in

## 2020-03-08 DIAGNOSIS — G801 Spastic diplegic cerebral palsy: Secondary | ICD-10-CM | POA: Diagnosis not present

## 2020-03-08 DIAGNOSIS — Z79899 Other long term (current) drug therapy: Secondary | ICD-10-CM | POA: Diagnosis not present

## 2020-03-08 DIAGNOSIS — R5383 Other fatigue: Secondary | ICD-10-CM

## 2020-03-08 DIAGNOSIS — G35 Multiple sclerosis: Secondary | ICD-10-CM | POA: Diagnosis not present

## 2020-03-08 DIAGNOSIS — R4189 Other symptoms and signs involving cognitive functions and awareness: Secondary | ICD-10-CM | POA: Diagnosis not present

## 2020-03-08 NOTE — Progress Notes (Signed)
[facility]  PATIENT: Stephen Wade DOB: 03/08/1951  REFERRING DOCTOR OR PCP:  Kathlene November SOURCE: Patient and wife  _________________________________   HISTORICAL  CHIEF COMPLAINT:  Chief Complaint  Patient presents with  . Follow-up    RM 12 with wife (temp: 97.5). Last seen 06/22/2019.   . Multiple Sclerosis    Did not want Ocrevus d/t SE. Has been off of this. In electric scooter in office today. Unable to stand to get weight.    HISTORY OF PRESENT ILLNESS:  Stephen Wade is a 69 y.o. man who was diagnosed with MS in 2005.    Update 03/08/2020: He had urosepsis and was hospitalized (he did 1 month Rehab afterwards).   He had major cognitive issues and then got pneumonia.  He felt weaker but is close to how he felt before the infections.   His right hand has a slow tremor when he tries to write.   He sometimes notes diplopia.   This comes and goes.    He has a foley catheter.   He has needed urgent flushes twice due to retention.    He only took one dose of Ocrevus July/August 2019.   He opted not to do the dose 6 months later.  Extensive notes from his hospitalization were reviewed including personal review of the MRI of the brain images which showed atrophy and white matter changes but no acute findings.  We had a long discussion about his multiple sclerosis and disease modifying therapies.  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  He might receive some benefit from disease modifying therapies though at a 43 and with a history of pneumonia we need to also consider the risk of medications.  I have elected to place her back on Ocrevus due to his recent hospitalization with infection including pneumonia.  He has not had definite exacerbations or follow-up Ocrevus.  I think a better course of action would be to begin a safer disease modifying therapy but consider Ocrevus if he has breakthrough activity.       Update 06/22/2019 (video) He had  his first split dose of Ocrevus July/August 2019.  He tolerated the first infusion well but did have mild reactions to the second infusions.  He was scheduled for Ocrevus in late January but opted not to be infused as he had a reaction to the medication.   We discussed treatment options.   He was diagnosed with MS in 2005.  His last exacerbation was at least 10 years ago.   I personally reviewed MRI from 2007, 2010 and 2018.  He did have some new lesions between 2007 and 2010 but there were no definite new lesions in the brain or cervical spine between 2010 and 2018.  He needed a cane after his first exacerbation in 2005 and has progressively worsened, to standard walker, rolling walker and now scooter.Marland Kitchen  He now spends most of the time in the scooter and can transfer independently unless more tired.     He has had multiple urinary tract infections.  He needs to self-cath 6 times a day due to urinary retention.   He also has constipation and takes Metamucil without enough benefit.  He reports fatigue especially when hot or later in the day.  He sleeps well most nights.  He denies too much problems with cognition.  His voice is slurred.  Mood is currently doing well.   Update 01/20/2018: He was hospitalized for  a week in November after a UTI due to urinary retention (he now cath's).   He had weakness and cognitive dysfunction.I personally reviewed the MRI's and he does not appear to have any new lesions with resolution of the he slowly returned back to his baseline. Currently, he is cathing himself 4 or 5 times a day and wears a condom cath at night in case there is any overflow.  In July, he switched to ocrelizumab from Tecfidera. He tolerated the first infusion very well.  He uses a scooter to get around that time. He has bilateral leg weakness, worse on the right.There is also mild numbness on the right. Muscle tone is increased, right greater than left. He denies any significant problems with his vision.   He has had some dysphagia and sometimes coughs up food.    He is often tired during the day.  Sleep is poor and he wakes up 3-4 times nightly.    He wakes up sometimes due to spasticity and leg pain.    Mood is about the same.    He is occasionlly irritable.   He feels better if he exercises some.    From 05/26/2017:                                                                                                                                                    MS:   He is currently on Tecfidera. He tolerates it well. We have previously discussed ocrelizumab as he had some interest in switching therapies.    We discussed the pros and cons of staying on Tecfidera versus switching to ocrelizumab and also reviewed with the clinical study data for ocrelizumab.  Gait, strength, sensation are about the same.   He is in a scooter much of the day.   He notes more more leg weakness the past 2 weeks.    The right leg is always worse.   He continues to exercise daily with a reclining elliptical.  Bladder/bowel:   He is now having much more urinary frequency and urgency.    He has more incontinence.      He sees [name] and he has recommended urodynamics and maybe a transurethral prostate procedure.      Fatigue/sleep:   Fatigue is tolerable most days.   Worse in heat.  He sleeps worse due to his bladder.     Mood and cognition have been stable.  He has mild skin breakdown in the sacral area.  His wife applies silver pads.   He has seen wound care in the past and we discussed him getting back in.   Pat[facility]).      MS History:  In 2005, he woke up with right leg weakness and clumsiness.  He had MRI's of the brain and spine consistent with MS.  He was originally seeing Dr. Kathrynn Ducking at [facility].   A repeat MRI showed progression allowing the diagnosis of MS to be made.    He started on Betaseron.  Since his first exacerbation, he has had progressive gait and cognitive dysfunction but these changes have mostly  been gradual.     Due to worsening, he was placed on Tysabri but felt much worse while on it.   He was also JCV Ab positive so stopped after 4-6 do7ses.   He went back to Betaseron and a couple years later transferred care to me.   We changed to Tecfidera.   He tolerates tecfidera well with mild flushing but no GI symptoms.    He feels mostly stable on Tecfidera but has had some progression of the poor gait.  He now spends much of his time in a scooter.    He had one flare-up last year, though, where he felt gait changed  A few days of IV Solu-Medrol helped to get back to a baseline.  July 2018 he switched to ocrelizumab      REVIEW OF SYSTEMS: Constitutional: No fevers, chills, sweats, or change in appetite Eyes: No visual changes, double vision, eye pain Ear, nose and throat: No hearing loss, ear pain, nasal congestion, sore throat Cardiovascular: No chest pain, palpitations.   Mild edema in legs. Respiratory: No shortness of breath at rest or with exertion.   No wheezes GastrointestinaI: No nausea, vomiting, diarrhea, abdominal pain, fecal incontinence Genitourinary: No dysuria, urinary retention or frequency.  No nocturia. Musculoskeletal: No neck pain, back pain Integumentary: No rash, pruritus, skin lesions Neurological: as above Psychiatric: as above Endocrine: No palpitations, diaphoresis, change in appetite, change in weigh or increased thirst Hematologic/Lymphatic: No anemia, purpura, petechiae. Allergic/Immunologic: No itchy/runny eyes, nasal congestion, recent allergic reactions, rashes  ALLERGIES: No Known Allergies  HOME MEDICATIONS:  Current Outpatient Medications:  .  baclofen (LIORESAL) 10 MG tablet, Take 1 tablet (10 mg total) by mouth 3 (three) times daily. (Patient taking differently: Take 10 mg by mouth 3 (three) times daily as needed. ), Disp: 90 tablet, Rfl: 1 .  ocrelizumab (OCREVUS) 300 MG/10ML injection, Inject 300 mg every 6 (six) months into the vein., Disp:  , Rfl:   PAST MEDICAL HISTORY: Past Medical History:  Diagnosis Date  . GERD (gastroesophageal reflux disease)    had on- off chest pain, symptoms resolved with Prilosec 11/2010  . Lower urinary tract symptoms (LUTS)    Dr Patsi Sears  . MS (multiple sclerosis) (HCC) 2005  . Testicular hypogonadism   . Urge incontinence of urine   . Urolithiasis 2011    PAST SURGICAL HISTORY: Past Surgical History:  Procedure Laterality Date  . APPENDECTOMY      ruptured 03-2010  . VASECTOMY      FAMILY HISTORY: Family History  Problem Relation Age of Onset  . Heart attack Mother   . Brain cancer Father   . Colon cancer Neg Hx   . Prostate cancer Neg Hx     SOCIAL HISTORY:  Social History   Socioeconomic History  . Marital status: Married    Spouse name: Not on file  . Number of children: 3  . Years of education: Not on file  . Highest education level: Not on file  Occupational History  . Occupation: still works some from home    Employer: [employer]  Tobacco Use  . Smoking status: Former Games developer  . Smokeless tobacco: Never Used  . Tobacco comment: 3 ppd,  quit 1999  Substance and Sexual Activity  . Alcohol use: No    Alcohol/week: 0.0 standard drinks    Comment: rare  . Drug use: No  . Sexual activity: Not on file  Other Topics Concern  . Not on file  Social History Narrative   Lives w/ wife    Social Determinants of Health   Financial Resource Strain:   . Difficulty of Paying Living Expenses:   Food Insecurity:   . Worried About Programme researcher, broadcasting/film/video in the Last Year:   . Barista in the Last Year:   Transportation Needs:   . Freight forwarder (Medical):   Marland Kitchen Lack of Transportation (Non-Medical):   Physical Activity:   . Days of Exercise per Week:   . Minutes of Exercise per Session:   Stress:   . Feeling of Stress :   Social Connections:   . Frequency of Communication with Friends and Family:   . Frequency of Social Gatherings with Friends and  Family:   . Attends Religious Services:   . Active Member of Clubs or Organizations:   . Attends Banker Meetings:   Marland Kitchen Marital Status:   Intimate Partner Violence:   . Fear of Current or Ex-Partner:   . Emotionally Abused:   Marland Kitchen Physically Abused:   . Sexually Abused:      PHYSICAL EXAM  Vitals:   03/08/20 1528  BP: 109/61  Pulse: 62  Temp: (!) 95.9 F (35.5 C)  SpO2: 98%  Height: 6' (1.829 m)    Body mass index is 26.46 kg/m.   General: The patient is well-developed and well-nourished  Skin: Extremities are without rash and he has mild pedal edema.   He has redness in the sacral area with mild ulceration.  Neurologic Exam  Mental status: The patient is alert and oriented x 3 at the time of the examination. The patient has apparent normal recent and remote memory, with a reduced  attention span and concentration ability.   Speech is mildly dysarthric.  Cranial nerves: Extraocular movements are full. .  Facial symmetry is present. Facial strength is normal.  Trapezius and sternocleidomastoid strength is normal. No dysarthria is noted.  Hearing appears normal.  Motor: He has a slow tremor in the right hand.  Muscle bulk is normal.   Tone is increased, legs > arms, right > left (normal in left arm). Strength is  4+ / 5 in left arm, 4/5 in right arm prox and 4+/5 grip, 2/5 in left leg hip extensors and 3/5 distally, 2-/5 in right leg.   Sensory: Intact sensation to touch and vibration. Coordination: Cerebellar testing reveals good finger-nose-finger on the left, reduced on the right  Gait and station:  Can not walk  Reflexes: Deep tendon reflexes are increased, more on the right.       DIAGNOSTIC DATA (LABS, IMAGING, TESTING) - I reviewed patient records, labs, notes, testing and imaging myself where available.  Lab Results  Component Value Date   WBC 8.6 02/01/2020   HGB 12.7 (A) 02/01/2020   HCT 37 (A) 02/01/2020   MCV 92.8 01/03/2020   PLT 308  02/01/2020      Component Value Date/Time   NA 137 02/01/2020 0000   K 3.8 02/01/2020 0000   CL 105 02/01/2020 0000   CO2 24 (A) 02/01/2020 0000   GLUCOSE 95 12/27/2019 0638   BUN 20 02/01/2020 0000   CREATININE 0.6 02/01/2020 0000   CREATININE 0.87  12/27/2019 0638   CALCIUM 9.1 02/01/2020 0000   PROT 6.7 12/27/2019 0638   PROT 7.2 05/26/2017 1351   ALBUMIN 3.8 02/01/2020 0000   ALBUMIN 4.4 05/26/2017 1351   AST 69 (A) 02/01/2020 0000   ALT 134 (A) 02/01/2020 0000   ALKPHOS 375 (A) 02/01/2020 0000   BILITOT 1.1 12/27/2019 0638   BILITOT 0.4 05/26/2017 1351   GFRNONAA 102 02/01/2020 0000   GFRAA 118 02/01/2020 0000    No results found for: VITAMINB12 Lab Results  Component Value Date   TSH 6.555 (H) 12/02/2019    Assessment and Plan:  Multiple sclerosis (HCC) - Plan: CBC with Differential/Platelet, Hepatic function panel, QuantiFERON-TB Gold Plus  Spastic diplegia (HCC)  Disturbed cognition  Other fatigue  High risk medication use - Plan: CBC with Differential/Platelet, Hepatic function panel, QuantiFERON-TB Gold Plus  1.    We had a long discussion about disease modifying therapies.  The risk of Ocrevus may not be worth the possible benefit.  Fortunately, most phase 3 studies include patients of his age or disability status.  Other options would be Aubagio, Tecfidera or Mayzent.  I went over the risks and benefits.  He is interested in starting Aubagio and I will check some blood work today. 2.     Try to stay active and exercise as tolerated. 3.     He will return to see me in 6 months or sooner if there are new or worsening neurologic symptoms.  45 minutes interaction with the majority of the time face-to-face discussing his recent hospitalization and risk to benefit considerations for his active form of secondary progressive MS.  Additional time was spent with extensive record review and documentation.  Avynn Klassen A. Epimenio Foot, MD, PhD 03/08/2020, 8:43 PM Certified in  Neurology, Clinical Neurophysiology, Sleep Medicine, Pain Medicine and Neuroimaging  [facility] [address], [address] [place], [place] [address] [phone]

## 2020-03-09 ENCOUNTER — Telehealth: Payer: Self-pay | Admitting: *Deleted

## 2020-03-09 NOTE — Telephone Encounter (Addendum)
Faxed completed/signed Aubagio start form to [facility] at [phone]. Received fax confirmation.  Submitted PA on CMM. Key: [id number] - PA Case ID: [id number]. Waiting on determination from [employer] Medicare Part D.  Request Reference Number: [id number]. AUBAGIO TAB 14MG  is approved through 12/22/2020. Your patient may now fill this prescription and it will be covered.

## 2020-03-10 ENCOUNTER — Other Ambulatory Visit: Payer: Self-pay | Admitting: Neurology

## 2020-03-10 ENCOUNTER — Telehealth: Payer: Self-pay

## 2020-03-10 DIAGNOSIS — Z79899 Other long term (current) drug therapy: Secondary | ICD-10-CM

## 2020-03-10 DIAGNOSIS — I4891 Unspecified atrial fibrillation: Secondary | ICD-10-CM | POA: Diagnosis not present

## 2020-03-10 DIAGNOSIS — F329 Major depressive disorder, single episode, unspecified: Secondary | ICD-10-CM

## 2020-03-10 DIAGNOSIS — Z87440 Personal history of urinary (tract) infections: Secondary | ICD-10-CM

## 2020-03-10 DIAGNOSIS — R5381 Other malaise: Secondary | ICD-10-CM

## 2020-03-10 DIAGNOSIS — G35 Multiple sclerosis: Secondary | ICD-10-CM

## 2020-03-10 DIAGNOSIS — E46 Unspecified protein-calorie malnutrition: Secondary | ICD-10-CM

## 2020-03-10 DIAGNOSIS — Z466 Encounter for fitting and adjustment of urinary device: Secondary | ICD-10-CM | POA: Diagnosis not present

## 2020-03-10 DIAGNOSIS — Z8701 Personal history of pneumonia (recurrent): Secondary | ICD-10-CM

## 2020-03-10 DIAGNOSIS — N319 Neuromuscular dysfunction of bladder, unspecified: Secondary | ICD-10-CM | POA: Diagnosis not present

## 2020-03-10 LAB — HEPATIC FUNCTION PANEL
ALT: 60 IU/L — ABNORMAL HIGH (ref 0–44)
AST: 44 IU/L — ABNORMAL HIGH (ref 0–40)
Albumin: 4.4 g/dL (ref 3.8–4.8)
Alkaline Phosphatase: 182 IU/L — ABNORMAL HIGH (ref 39–117)
Bilirubin Total: 0.4 mg/dL (ref 0.0–1.2)
Bilirubin, Direct: 0.15 mg/dL (ref 0.00–0.40)
Total Protein: 7.3 g/dL (ref 6.0–8.5)

## 2020-03-10 LAB — CBC WITH DIFFERENTIAL/PLATELET
Basophils Absolute: 0.1 10*3/uL (ref 0.0–0.2)
Basos: 1 %
EOS (ABSOLUTE): 0.2 10*3/uL (ref 0.0–0.4)
Eos: 2 %
Hematocrit: 39.7 % (ref 37.5–51.0)
Hemoglobin: 13.9 g/dL (ref 13.0–17.7)
Immature Grans (Abs): 0 10*3/uL (ref 0.0–0.1)
Immature Granulocytes: 0 %
Lymphocytes Absolute: 1 10*3/uL (ref 0.7–3.1)
Lymphs: 16 %
MCH: 30 pg (ref 26.6–33.0)
MCHC: 35 g/dL (ref 31.5–35.7)
MCV: 86 fL (ref 79–97)
Monocytes Absolute: 0.6 10*3/uL (ref 0.1–0.9)
Monocytes: 9 %
Neutrophils Absolute: 4.7 10*3/uL (ref 1.4–7.0)
Neutrophils: 72 %
Platelets: 205 10*3/uL (ref 150–450)
RBC: 4.64 x10E6/uL (ref 4.14–5.80)
RDW: 14.3 % (ref 11.6–15.4)
WBC: 6.6 10*3/uL (ref 3.4–10.8)

## 2020-03-10 LAB — QUANTIFERON-TB GOLD PLUS
QuantiFERON Mitogen Value: 2.99 IU/mL
QuantiFERON Nil Value: 0.03 IU/mL
QuantiFERON TB1 Ag Value: 0.02 IU/mL
QuantiFERON TB2 Ag Value: 0.02 IU/mL
QuantiFERON-TB Gold Plus: NEGATIVE

## 2020-03-10 NOTE — Telephone Encounter (Signed)
Plan of care signed and faxed back to [facility] at [phone]. Form sent for scanning.  

## 2020-03-13 ENCOUNTER — Telehealth: Payer: Self-pay | Admitting: *Deleted

## 2020-03-13 NOTE — Telephone Encounter (Signed)
Called and spoke with pt. Relayed Dr. Bonnita Hollow message. He verbalized understanding.  He will have Verlon Au call back to discuss further.

## 2020-03-13 NOTE — Telephone Encounter (Signed)
-----   Message from Asa Lente, MD sent at 03/10/2020 10:19 AM EDT ----- I placed an order for monthly liver tests to start after he begins Aubagio --- will be important to do these as baseline liver test was mildly elevated.   If they increase much, we will need to switch med's

## 2020-03-14 ENCOUNTER — Telehealth: Payer: Self-pay

## 2020-03-14 NOTE — Telephone Encounter (Signed)
Physician orders received from [facility]. Orders signed and faxed back to [phone]. Form sent for scanning.

## 2020-03-15 ENCOUNTER — Telehealth: Payer: Self-pay | Admitting: *Deleted

## 2020-03-15 NOTE — Telephone Encounter (Signed)
Called and spoke with wife, Stephen Wade (on [place]). Cort does not want to start the medication right now. He is concerned about potential SE for Aubagio. He wants to think about it longer and will call [facility] back if he decides to proceed with therapy. Asked her to call back if he would like to discuss further with me or Dr. Epimenio Foot. She was not home at time of call. She verbalized understanding.

## 2020-03-15 NOTE — Telephone Encounter (Signed)
Took call from phone staff and spoke with Angelique w/ [facility]. She reports that they spoke with wife informing them they still need signed HIPAA from pt for Auabgio. They were waiting on online consent to be completed. They never received and had nurse f/u. When nurse spoke with pt/wife they informed them they no longer wanted to start on Aubagio therapy after listening to their recorded IFA (product info line). They did tell them to f/u with our office about therapy, we have not received call. Advised we will f/u to see how they would like to proceed. She would like update after we speak with them. Angelique phone: [phone] ext [phone] Fax: [phone]

## 2020-03-15 NOTE — Telephone Encounter (Signed)
Pt's wife called back stating that the pt would like the provider to call him when available. [phone]

## 2020-03-20 ENCOUNTER — Telehealth: Payer: Self-pay | Admitting: Neurology

## 2020-03-20 DIAGNOSIS — E46 Unspecified protein-calorie malnutrition: Secondary | ICD-10-CM | POA: Diagnosis not present

## 2020-03-20 DIAGNOSIS — F329 Major depressive disorder, single episode, unspecified: Secondary | ICD-10-CM | POA: Diagnosis not present

## 2020-03-20 DIAGNOSIS — I4891 Unspecified atrial fibrillation: Secondary | ICD-10-CM | POA: Diagnosis not present

## 2020-03-20 DIAGNOSIS — Z23 Encounter for immunization: Secondary | ICD-10-CM | POA: Diagnosis not present

## 2020-03-20 DIAGNOSIS — N319 Neuromuscular dysfunction of bladder, unspecified: Secondary | ICD-10-CM | POA: Diagnosis not present

## 2020-03-20 DIAGNOSIS — Z466 Encounter for fitting and adjustment of urinary device: Secondary | ICD-10-CM | POA: Diagnosis not present

## 2020-03-20 DIAGNOSIS — G35 Multiple sclerosis: Secondary | ICD-10-CM | POA: Diagnosis not present

## 2020-03-20 NOTE — Telephone Encounter (Signed)
I called and got voicemail.  I left a message and will try to call later this week.  Mr. Beavers wife Verlon Au) called last week about concerns about Aubagio.

## 2020-03-21 ENCOUNTER — Encounter: Payer: Medicare Other | Admitting: Physical Medicine & Rehabilitation

## 2020-03-22 ENCOUNTER — Telehealth: Payer: Self-pay

## 2020-03-22 NOTE — Telephone Encounter (Signed)
Hospice orders received, signed and mailed back in envelope provided. Copy of orders sent for scanning.

## 2020-03-22 NOTE — Telephone Encounter (Signed)
I spoke with Stephen Wade to discuss the medications.  He had been on Ocrevus and had urosepsis.  He has been off of any disease modifying therapy for the last year.  He has had his first COVID-19 vaccination and will be getting the second 1 in a couple weeks.  We had discussed other medications and decided on having him try Aubagio.  He has given this more thought and would prefer to go back on the Ocrevus.  I discussed this further.  Due to his urosepsis while on Ocrevus and him having more disability, I am concerned that the risks of Ocrevus may not justify the benefits.  Aubagio would be much safer and I would like him to try that first.  If he does have significant progression we could also consider Mayzent.

## 2020-03-23 NOTE — Telephone Encounter (Signed)
Took call from phone staff and spoke with Angelique with [facility]. She wanted update on pt. Advised Dr. Epimenio Foot spoke with pt last night. Agreed to proceed with Aubagio. They will have nurse reach back out to the pt to get him started on therapy. Nothing further needed at this time.

## 2020-03-27 NOTE — Telephone Encounter (Signed)
Received fax from [facility] that no PA required for Aubagio. Pt not eligible for one start program or for copay assistance program due to having Medicare.

## 2020-03-31 DIAGNOSIS — G35 Multiple sclerosis: Secondary | ICD-10-CM | POA: Diagnosis not present

## 2020-03-31 DIAGNOSIS — I4891 Unspecified atrial fibrillation: Secondary | ICD-10-CM | POA: Diagnosis not present

## 2020-03-31 DIAGNOSIS — F329 Major depressive disorder, single episode, unspecified: Secondary | ICD-10-CM | POA: Diagnosis not present

## 2020-03-31 DIAGNOSIS — Z466 Encounter for fitting and adjustment of urinary device: Secondary | ICD-10-CM | POA: Diagnosis not present

## 2020-03-31 DIAGNOSIS — E46 Unspecified protein-calorie malnutrition: Secondary | ICD-10-CM | POA: Diagnosis not present

## 2020-03-31 DIAGNOSIS — N319 Neuromuscular dysfunction of bladder, unspecified: Secondary | ICD-10-CM | POA: Diagnosis not present

## 2020-04-03 ENCOUNTER — Other Ambulatory Visit: Payer: Self-pay | Admitting: Surgery

## 2020-04-03 ENCOUNTER — Telehealth: Payer: Self-pay

## 2020-04-03 DIAGNOSIS — K8020 Calculus of gallbladder without cholecystitis without obstruction: Secondary | ICD-10-CM | POA: Diagnosis not present

## 2020-04-03 DIAGNOSIS — Z466 Encounter for fitting and adjustment of urinary device: Secondary | ICD-10-CM | POA: Diagnosis not present

## 2020-04-03 NOTE — Telephone Encounter (Signed)
Please advise 

## 2020-04-03 NOTE — Telephone Encounter (Signed)
Spoke w/ [facility]- verbal orders given. Also, MSW eval.

## 2020-04-03 NOTE — Telephone Encounter (Signed)
Agree with recommendations.  

## 2020-04-03 NOTE — Telephone Encounter (Signed)
Pt fell yesterday, no injuries.  He has an open area to his L buttocks.  Can HH do duoderm to that area?  Can visits be increased to 3x a week to monitor the wound?  Call [facility] at [phone].

## 2020-04-12 NOTE — Telephone Encounter (Addendum)
Called pt to see if he was able to get started on Aubagio. He gave phone to wife. She states he has not started medication yet. They have paperwork from [employer] for PAP. Advised they should fill this out at their earliest convenience and turn in because they will need time to process the form. I called [employer] at [phone]. Spoke with Annice Pih. She transferred me to site coordinator. Spoke with Marylene Land. Relayed I spoke with pt/wife and advised they have PAP application and will work on this tonight and get turned in asap. She verified nothing further is needed at this time from our office.

## 2020-04-17 ENCOUNTER — Encounter (HOSPITAL_COMMUNITY): Payer: Self-pay | Admitting: *Deleted

## 2020-04-17 ENCOUNTER — Emergency Department (HOSPITAL_COMMUNITY): Payer: Medicare Other

## 2020-04-17 ENCOUNTER — Inpatient Hospital Stay (HOSPITAL_COMMUNITY)
Admission: EM | Admit: 2020-04-17 | Discharge: 2020-04-27 | DRG: 417 | Disposition: A | Discharge status: Hospice | Payer: Medicare Other | Attending: Internal Medicine | Admitting: Internal Medicine

## 2020-04-17 ENCOUNTER — Other Ambulatory Visit: Payer: Self-pay

## 2020-04-17 DIAGNOSIS — T83518A Infection and inflammatory reaction due to other urinary catheter, initial encounter: Secondary | ICD-10-CM | POA: Diagnosis present

## 2020-04-17 DIAGNOSIS — E161 Other hypoglycemia: Secondary | ICD-10-CM | POA: Diagnosis not present

## 2020-04-17 DIAGNOSIS — K5989 Other specified functional intestinal disorders: Secondary | ICD-10-CM | POA: Diagnosis not present

## 2020-04-17 DIAGNOSIS — I4891 Unspecified atrial fibrillation: Secondary | ICD-10-CM | POA: Diagnosis not present

## 2020-04-17 DIAGNOSIS — Z515 Encounter for palliative care: Secondary | ICD-10-CM

## 2020-04-17 DIAGNOSIS — Q441 Other congenital malformations of gallbladder: Secondary | ICD-10-CM

## 2020-04-17 DIAGNOSIS — J690 Pneumonitis due to inhalation of food and vomit: Secondary | ICD-10-CM | POA: Diagnosis not present

## 2020-04-17 DIAGNOSIS — K859 Acute pancreatitis without necrosis or infection, unspecified: Secondary | ICD-10-CM

## 2020-04-17 DIAGNOSIS — K8012 Calculus of gallbladder with acute and chronic cholecystitis without obstruction: Secondary | ICD-10-CM | POA: Diagnosis present

## 2020-04-17 DIAGNOSIS — Z993 Dependence on wheelchair: Secondary | ICD-10-CM

## 2020-04-17 DIAGNOSIS — I480 Paroxysmal atrial fibrillation: Secondary | ICD-10-CM | POA: Diagnosis present

## 2020-04-17 DIAGNOSIS — Z419 Encounter for procedure for purposes other than remedying health state, unspecified: Secondary | ICD-10-CM

## 2020-04-17 DIAGNOSIS — R1111 Vomiting without nausea: Secondary | ICD-10-CM | POA: Diagnosis not present

## 2020-04-17 DIAGNOSIS — F5000 Anorexia nervosa, unspecified: Secondary | ICD-10-CM

## 2020-04-17 DIAGNOSIS — Z87440 Personal history of urinary (tract) infections: Secondary | ICD-10-CM

## 2020-04-17 DIAGNOSIS — K5900 Constipation, unspecified: Secondary | ICD-10-CM | POA: Diagnosis not present

## 2020-04-17 DIAGNOSIS — Z20822 Contact with and (suspected) exposure to covid-19: Secondary | ICD-10-CM | POA: Diagnosis present

## 2020-04-17 DIAGNOSIS — E440 Moderate protein-calorie malnutrition: Secondary | ICD-10-CM | POA: Diagnosis present

## 2020-04-17 DIAGNOSIS — N319 Neuromuscular dysfunction of bladder, unspecified: Secondary | ICD-10-CM | POA: Diagnosis present

## 2020-04-17 DIAGNOSIS — N3941 Urge incontinence: Secondary | ICD-10-CM | POA: Diagnosis present

## 2020-04-17 DIAGNOSIS — J90 Pleural effusion, not elsewhere classified: Secondary | ICD-10-CM

## 2020-04-17 DIAGNOSIS — N218 Other lower urinary tract calculus: Secondary | ICD-10-CM | POA: Diagnosis not present

## 2020-04-17 DIAGNOSIS — K567 Ileus, unspecified: Secondary | ICD-10-CM | POA: Diagnosis not present

## 2020-04-17 DIAGNOSIS — R1310 Dysphagia, unspecified: Secondary | ICD-10-CM | POA: Diagnosis present

## 2020-04-17 DIAGNOSIS — Z6826 Body mass index (BMI) 26.0-26.9, adult: Secondary | ICD-10-CM | POA: Diagnosis not present

## 2020-04-17 DIAGNOSIS — Z79899 Other long term (current) drug therapy: Secondary | ICD-10-CM

## 2020-04-17 DIAGNOSIS — R0902 Hypoxemia: Secondary | ICD-10-CM | POA: Diagnosis not present

## 2020-04-17 DIAGNOSIS — Z4682 Encounter for fitting and adjustment of non-vascular catheter: Secondary | ICD-10-CM | POA: Diagnosis not present

## 2020-04-17 DIAGNOSIS — N200 Calculus of kidney: Secondary | ICD-10-CM | POA: Diagnosis not present

## 2020-04-17 DIAGNOSIS — M2550 Pain in unspecified joint: Secondary | ICD-10-CM | POA: Diagnosis not present

## 2020-04-17 DIAGNOSIS — K219 Gastro-esophageal reflux disease without esophagitis: Secondary | ICD-10-CM | POA: Diagnosis present

## 2020-04-17 DIAGNOSIS — Z0189 Encounter for other specified special examinations: Secondary | ICD-10-CM

## 2020-04-17 DIAGNOSIS — N209 Urinary calculus, unspecified: Secondary | ICD-10-CM | POA: Diagnosis present

## 2020-04-17 DIAGNOSIS — R7401 Elevation of levels of liver transaminase levels: Secondary | ICD-10-CM | POA: Diagnosis present

## 2020-04-17 DIAGNOSIS — E291 Testicular hypofunction: Secondary | ICD-10-CM | POA: Diagnosis present

## 2020-04-17 DIAGNOSIS — N210 Calculus in bladder: Secondary | ICD-10-CM | POA: Diagnosis present

## 2020-04-17 DIAGNOSIS — K851 Biliary acute pancreatitis without necrosis or infection: Secondary | ICD-10-CM | POA: Diagnosis not present

## 2020-04-17 DIAGNOSIS — L8990 Pressure ulcer of unspecified site, unspecified stage: Secondary | ICD-10-CM | POA: Diagnosis present

## 2020-04-17 DIAGNOSIS — J9601 Acute respiratory failure with hypoxia: Secondary | ICD-10-CM | POA: Diagnosis not present

## 2020-04-17 DIAGNOSIS — R1010 Upper abdominal pain, unspecified: Secondary | ICD-10-CM | POA: Diagnosis not present

## 2020-04-17 DIAGNOSIS — R7989 Other specified abnormal findings of blood chemistry: Secondary | ICD-10-CM | POA: Diagnosis present

## 2020-04-17 DIAGNOSIS — K8010 Calculus of gallbladder with chronic cholecystitis without obstruction: Secondary | ICD-10-CM | POA: Diagnosis not present

## 2020-04-17 DIAGNOSIS — Z978 Presence of other specified devices: Secondary | ICD-10-CM

## 2020-04-17 DIAGNOSIS — Z8701 Personal history of pneumonia (recurrent): Secondary | ICD-10-CM

## 2020-04-17 DIAGNOSIS — Z03818 Encounter for observation for suspected exposure to other biological agents ruled out: Secondary | ICD-10-CM | POA: Diagnosis not present

## 2020-04-17 DIAGNOSIS — R9431 Abnormal electrocardiogram [ECG] [EKG]: Secondary | ICD-10-CM | POA: Diagnosis not present

## 2020-04-17 DIAGNOSIS — Z66 Do not resuscitate: Secondary | ICD-10-CM | POA: Diagnosis not present

## 2020-04-17 DIAGNOSIS — Z87891 Personal history of nicotine dependence: Secondary | ICD-10-CM

## 2020-04-17 DIAGNOSIS — E162 Hypoglycemia, unspecified: Secondary | ICD-10-CM | POA: Diagnosis not present

## 2020-04-17 DIAGNOSIS — Z7189 Other specified counseling: Secondary | ICD-10-CM | POA: Diagnosis not present

## 2020-04-17 DIAGNOSIS — L89152 Pressure ulcer of sacral region, stage 2: Secondary | ICD-10-CM | POA: Diagnosis present

## 2020-04-17 DIAGNOSIS — Y846 Urinary catheterization as the cause of abnormal reaction of the patient, or of later complication, without mention of misadventure at the time of the procedure: Secondary | ICD-10-CM | POA: Diagnosis present

## 2020-04-17 DIAGNOSIS — E559 Vitamin D deficiency, unspecified: Secondary | ICD-10-CM | POA: Diagnosis not present

## 2020-04-17 DIAGNOSIS — R1084 Generalized abdominal pain: Secondary | ICD-10-CM | POA: Diagnosis not present

## 2020-04-17 DIAGNOSIS — R05 Cough: Secondary | ICD-10-CM | POA: Diagnosis not present

## 2020-04-17 DIAGNOSIS — K819 Cholecystitis, unspecified: Secondary | ICD-10-CM | POA: Diagnosis not present

## 2020-04-17 DIAGNOSIS — Z7401 Bed confinement status: Secondary | ICD-10-CM

## 2020-04-17 DIAGNOSIS — N390 Urinary tract infection, site not specified: Secondary | ICD-10-CM | POA: Diagnosis present

## 2020-04-17 DIAGNOSIS — R261 Paralytic gait: Secondary | ICD-10-CM | POA: Diagnosis not present

## 2020-04-17 DIAGNOSIS — G35 Multiple sclerosis: Secondary | ICD-10-CM | POA: Diagnosis present

## 2020-04-17 DIAGNOSIS — R109 Unspecified abdominal pain: Secondary | ICD-10-CM | POA: Diagnosis not present

## 2020-04-17 DIAGNOSIS — D72829 Elevated white blood cell count, unspecified: Secondary | ICD-10-CM

## 2020-04-17 DIAGNOSIS — Z87442 Personal history of urinary calculi: Secondary | ICD-10-CM

## 2020-04-17 DIAGNOSIS — K8020 Calculus of gallbladder without cholecystitis without obstruction: Secondary | ICD-10-CM | POA: Diagnosis not present

## 2020-04-17 LAB — COMPREHENSIVE METABOLIC PANEL
ALT: 67 U/L — ABNORMAL HIGH (ref 0–44)
AST: 59 U/L — ABNORMAL HIGH (ref 15–41)
Albumin: 3.4 g/dL — ABNORMAL LOW (ref 3.5–5.0)
Alkaline Phosphatase: 197 U/L — ABNORMAL HIGH (ref 38–126)
Anion gap: 11 (ref 5–15)
BUN: 23 mg/dL (ref 8–23)
CO2: 27 mmol/L (ref 22–32)
Calcium: 10.4 mg/dL — ABNORMAL HIGH (ref 8.9–10.3)
Chloride: 107 mmol/L (ref 98–111)
Creatinine, Ser: 0.82 mg/dL (ref 0.61–1.24)
GFR calc Af Amer: >60 mL/min (ref >60)
GFR calc non Af Amer: >60 mL/min (ref >60)
Glucose, Bld: 74 mg/dL (ref 70–99)
Potassium: 4.1 mmol/L (ref 3.5–5.1)
Sodium: 145 mmol/L (ref 135–145)
Total Bilirubin: 0.8 mg/dL (ref 0.3–1.2)
Total Protein: 7.1 g/dL (ref 6.5–8.1)

## 2020-04-17 LAB — LIPASE, BLOOD: Lipase: 68 U/L — ABNORMAL HIGH (ref 11–51)

## 2020-04-17 LAB — CBC
HCT: 41.7 % (ref 39.0–52.0)
Hemoglobin: 13.4 g/dL (ref 13.0–17.0)
MCH: 29.6 pg (ref 26.0–34.0)
MCHC: 32.1 g/dL (ref 30.0–36.0)
MCV: 92.1 fL (ref 80.0–100.0)
Platelets: 226 10*3/uL (ref 150–400)
RBC: 4.53 MIL/uL (ref 4.22–5.81)
RDW: 16.2 % — ABNORMAL HIGH (ref 11.5–15.5)
WBC: 5.1 10*3/uL (ref 4.0–10.5)
nRBC: 0 % (ref 0.0–0.2)

## 2020-04-17 LAB — URINALYSIS, ROUTINE W REFLEX MICROSCOPIC
Bilirubin Urine: NEGATIVE
Glucose, UA: NEGATIVE mg/dL
Ketones, ur: NEGATIVE mg/dL
Nitrite: POSITIVE — AB
Protein, ur: NEGATIVE mg/dL
Specific Gravity, Urine: <1.005 — ABNORMAL LOW (ref 1.005–1.030)
pH: 7.5 (ref 5.0–8.0)

## 2020-04-17 LAB — URINALYSIS, MICROSCOPIC (REFLEX): Squamous Epithelial / HPF: NONE SEEN (ref 0–5)

## 2020-04-17 LAB — RESPIRATORY PANEL BY RT PCR (FLU A&B, COVID)
Influenza A by PCR: NEGATIVE
Influenza B by PCR: NEGATIVE
SARS Coronavirus 2 by RT PCR: NEGATIVE

## 2020-04-17 MED ORDER — ONDANSETRON HCL 4 MG/2ML IJ SOLN
4.0000 mg | Freq: Four times a day (QID) | INTRAMUSCULAR | Status: DC | PRN
  Administered 2020-04-19 – 2020-04-20 (×5): 4 mg via INTRAVENOUS
  Filled 2020-04-17 (×6): qty 2

## 2020-04-17 MED ORDER — SODIUM CHLORIDE 0.9% FLUSH
3.0000 mL | Freq: Once | INTRAVENOUS | Status: AC
  Administered 2020-04-17: 3 mL via INTRAVENOUS

## 2020-04-17 MED ORDER — SODIUM CHLORIDE 0.9% FLUSH
3.0000 mL | Freq: Two times a day (BID) | INTRAVENOUS | Status: DC
  Administered 2020-04-17 – 2020-04-26 (×11): 3 mL via INTRAVENOUS

## 2020-04-17 MED ORDER — ALBUTEROL SULFATE (2.5 MG/3ML) 0.083% IN NEBU: 2.5000 mg | INHALATION_SOLUTION | Freq: Four times a day (QID) | RESPIRATORY_TRACT | Status: DC | PRN

## 2020-04-17 MED ORDER — SODIUM CHLORIDE 0.9 % IV SOLN
2.0000 g | INTRAVENOUS | Status: DC
  Administered 2020-04-18 – 2020-04-19 (×2): 2 g via INTRAVENOUS
  Filled 2020-04-17 (×2): qty 20

## 2020-04-17 MED ORDER — FENTANYL CITRATE (PF) 100 MCG/2ML IJ SOLN: 12.5000 ug | INTRAMUSCULAR | Status: DC

## 2020-04-17 MED ORDER — IOHEXOL 300 MG/ML  SOLN
100.0000 mL | Freq: Once | INTRAMUSCULAR | Status: AC | PRN
  Administered 2020-04-17: 100 mL via INTRAVENOUS

## 2020-04-17 MED ORDER — ENOXAPARIN SODIUM 40 MG/0.4ML ~~LOC~~ SOLN
40.0000 mg | SUBCUTANEOUS | Status: DC
  Administered 2020-04-17 – 2020-04-27 (×11): 40 mg via SUBCUTANEOUS
  Filled 2020-04-17 (×11): qty 0.4

## 2020-04-17 MED ORDER — SODIUM CHLORIDE 0.9 % IV SOLN
500.0000 mg | INTRAVENOUS | Status: DC
  Administered 2020-04-18 – 2020-04-19 (×2): 500 mg via INTRAVENOUS
  Filled 2020-04-17 (×2): qty 500

## 2020-04-17 MED ORDER — IOHEXOL 350 MG/ML SOLN: 75.0000 mL | Freq: Once | INTRAVENOUS | Status: DC | PRN

## 2020-04-17 MED ORDER — ONDANSETRON HCL 4 MG PO TABS
4.0000 mg | ORAL_TABLET | Freq: Four times a day (QID) | ORAL | Status: DC | PRN
  Filled 2020-04-17: qty 1

## 2020-04-17 MED ORDER — CHLORHEXIDINE GLUCONATE CLOTH 2 % EX PADS
6.0000 | MEDICATED_PAD | Freq: Every day | CUTANEOUS | Status: DC
  Administered 2020-04-19 – 2020-04-27 (×9): 6 via TOPICAL

## 2020-04-17 MED ORDER — LACTATED RINGERS IV BOLUS
1000.0000 mL | Freq: Once | INTRAVENOUS | Status: AC
  Administered 2020-04-17: 1000 mL via INTRAVENOUS

## 2020-04-17 MED ORDER — FENTANYL CITRATE (PF) 100 MCG/2ML IJ SOLN: 12.5000 ug | INTRAMUSCULAR | Status: DC | PRN

## 2020-04-17 MED ORDER — ACETAMINOPHEN 325 MG PO TABS: 650.0000 mg | ORAL_TABLET | Freq: Four times a day (QID) | ORAL | Status: DC | PRN

## 2020-04-17 MED ORDER — SODIUM CHLORIDE 0.9 % IV SOLN
1.0000 g | Freq: Once | INTRAVENOUS | Status: AC
  Administered 2020-04-17: 1 g via INTRAVENOUS
  Filled 2020-04-17: qty 10

## 2020-04-17 MED ORDER — LACTATED RINGERS IV SOLN: INTRAVENOUS | Status: DC

## 2020-04-17 MED ORDER — ACETAMINOPHEN 650 MG RE SUPP: 650.0000 mg | Freq: Four times a day (QID) | RECTAL | Status: DC | PRN

## 2020-04-17 MED ORDER — SODIUM CHLORIDE 0.9 % IV SOLN
500.0000 mg | Freq: Once | INTRAVENOUS | Status: AC
  Administered 2020-04-17: 500 mg via INTRAVENOUS
  Filled 2020-04-17: qty 500

## 2020-04-17 NOTE — Progress Notes (Signed)
New Admission Note:  Arrival Method: Stretcher Mental Orientation: Alert and oriented x 3 Telemetry: Box 18 NSR Assessment: Completed Skin: MSAD to sacrum IV: NSLs Pain: Denies Tubes: Foley Safety Measures: Safety Fall Prevention Plan initiated.  Admission: Completed 5 M  Orientation: Patient has been orientated to the room, unit and the staff. Welcome booklet given.  Family: None  Orders have been reviewed and implemented. Will continue to monitor the patient. Call light has been placed within reach and bed alarm has been activated.   Guilford Shi BSN, RN  Phone Number: [phone]

## 2020-04-17 NOTE — Progress Notes (Signed)
[facility]--The [facility]Based [facility] of [facility]--Pt has been active with [facility] services since 01/19/2020.  He is seen in the home by the [facility] Nurse and Social Worker for support.  Pt has a chronic indwelling foley and lives at home with wife.  Will continue to follow hospital course and plan to resume services when pt returns home.  Please call [facility] if we can be of assistance with d/c planning. Julius Bowels, RN Palma Holter

## 2020-04-17 NOTE — ED Provider Notes (Addendum)
[facility] EMERGENCY DEPARTMENT Provider Note   CSN: [id number] Arrival date & time: 04/17/20  9326     History No chief complaint on file.   Stephen Wade is a 69 y.o. male.  Patient is a 69 year old male with a history of advanced MS which has left him wheelchair and bedbound with chronic Foley catheter and recent urosepsis and pneumonia who presents today with approximately 1 month of intermittent abdominal pain with occasional vomiting.  Wife reports over the last few days patient has refused to eat and has been more lethargic.  She does report he has had a cough and does intermittently seem to choke when he eats.  He has had no fever and he denies any abdominal pain at this time.  They did see Dr. Magnus Ivan and at that time because he was not having significant findings for cholecystitis and his underlying medical problems that he was not a great current surgical candidate.  Wife reports that the urine has had a lot more sediment in it and she is now flushing at home with vinegar and water.  He does have hospice services and they are changing his Foley catheter weekly.  He has received both doses of his Covid vaccine the second dose was approximately a month ago.  Patient reports no change in bowel movements.  He is always constipated and it seems to be persistent.  He denies any shortness of breath at this time.  The history is provided by the patient and the spouse.       Past Medical History:  Diagnosis Date  . GERD (gastroesophageal reflux disease)    had on- off chest pain, symptoms resolved with Prilosec 11/2010  . Lower urinary tract symptoms (LUTS)    Dr Patsi Sears  . MS (multiple sclerosis) (HCC) 2005  . Testicular hypogonadism   . Urge incontinence of urine   . Urolithiasis 2011    Patient Active Problem List   Diagnosis Date Noted  . High risk medication use 03/10/2020  . Abnormality of gait 01/18/2020  . Noncompliance   . Depressive reaction     . Goals of care, counseling/discussion   . Encounter for hospice care discussion   . Palliative care encounter   . Neurogenic bowel   . Slow transit constipation   . Acute lower UTI   . Neurogenic bladder   . Spasticity   . Depression   . Debility 12/08/2019  . Acute blood loss anemia   . Transaminitis   . Hypoalbuminemia due to protein-calorie malnutrition (HCC)   . Hypernatremia   . Steroid-induced hyperglycemia   . AKI (acute kidney injury) (HCC)   . Dysphagia   . Acute respiratory failure (HCC) 12/07/2019  . Chronic indwelling Foley catheter 12/07/2019  . Acute respiratory failure with hypoxia (HCC) 11/28/2019  . Pneumonia 11/28/2019  . Atrial fibrillation with RVR (HCC) 11/28/2019  . UTI (urinary tract infection) 11/27/2019  . Acute metabolic encephalopathy 11/27/2019  . Urinary retention 06/22/2019  . PCP NOTES >>>>>>> 05/24/2018  . Depression, recurrent (HCC) 05/24/2018  . History of recurrent UTIs 05/22/2018  . Pressure injury of skin 11/06/2017  . Multiple sclerosis (HCC) 11/04/2017  . Skin ulcer of sacral region (HCC) 05/26/2017  . Vitamin D deficiency 01/16/2016  . Spastic gait 02/10/2015  . Spastic diplegia (HCC) 02/10/2015  . Other fatigue 02/10/2015  . Disturbed cognition 02/10/2015  . Edema 05/12/2014  . Hypogonadism male 03/11/2013  . BPH - self caths 12/26/2011  . GERD  11/30/2010  . URINARY CALCULUS 10/05/2010    Past Surgical History:  Procedure Laterality Date  . APPENDECTOMY      ruptured 03-2010  . VASECTOMY         Family History  Problem Relation Age of Onset  . Heart attack Mother   . Brain cancer Father   . Colon cancer Neg Hx   . Prostate cancer Neg Hx     Social History   Tobacco Use  . Smoking status: Former Games developer  . Smokeless tobacco: Never Used  . Tobacco comment: 3 ppd, quit 1999  Substance Use Topics  . Alcohol use: No    Alcohol/week: 0.0 standard drinks    Comment: rare  . Drug use: No    Home  Medications Prior to Admission medications   Medication Sig Start Date End Date Taking? Authorizing Provider  baclofen (LIORESAL) 10 MG tablet Take 1 tablet (10 mg total) by mouth 3 (three) times daily. Patient taking differently: Take 10 mg by mouth 3 (three) times daily as needed.  12/30/19 12/29/20  Angiulli, Mcarthur Rossetti, PA-C  Teriflunomide (AUBAGIO) 14 MG TABS Take by mouth.    [provider]    Allergies    Patient has no known allergies.  Review of Systems   Review of Systems  All other systems reviewed and are negative.   Physical Exam Updated Vital Signs BP 123/74 (BP Location: Right Arm)   Pulse 88   Temp (!) 96.1 F (35.6 C) (Axillary)   Resp 16   Ht 6' (1.829 m)   Wt 88.5 kg   SpO2 97%   BMI 26.46 kg/m   Physical Exam Vitals and nursing note reviewed.  Constitutional:      General: He is not in acute distress.    Appearance: He is well-developed and normal weight.  HENT:     Head: Normocephalic and atraumatic.     Mouth/Throat:     Mouth: Mucous membranes are dry.  Eyes:     Conjunctiva/sclera: Conjunctivae normal.     Pupils: Pupils are equal, round, and reactive to light.  Cardiovascular:     Rate and Rhythm: Normal rate and regular rhythm.     Pulses: Normal pulses.     Heart sounds: No murmur.  Pulmonary:     Effort: Pulmonary effort is normal. No respiratory distress.     Breath sounds: Normal breath sounds. No wheezing or rales.  Abdominal:     General: There is no distension.     Palpations: Abdomen is soft.     Tenderness: There is abdominal tenderness in the left upper quadrant. There is no guarding or rebound.  Musculoskeletal:        General: No tenderness. Normal range of motion.     Cervical back: Normal range of motion and neck supple.     Right lower leg: No edema.     Left lower leg: No edema.  Skin:    General: Skin is warm and dry.     Findings: No erythema or rash.  Neurological:     Mental Status: He is alert and oriented  to person, place, and time.     Comments: Weakness present in bilateral lower extremities with mild contractures noted.  Speech is slow and slightly slurred but appropriate  Psychiatric:        Mood and Affect: Mood normal.        Behavior: Behavior normal.        Thought Content: Thought content  normal.     ED Results / Procedures / Treatments   Labs (all labs ordered are listed, but only abnormal results are displayed) Labs Reviewed  LIPASE, BLOOD - Abnormal; Notable for the following components:      Result Value   Lipase 68 (*)    All other components within normal limits  COMPREHENSIVE METABOLIC PANEL - Abnormal; Notable for the following components:   Calcium 10.4 (*)    Albumin 3.4 (*)    AST 59 (*)    ALT 67 (*)    Alkaline Phosphatase 197 (*)    All other components within normal limits  CBC - Abnormal; Notable for the following components:   RDW 16.2 (*)    All other components within normal limits  URINALYSIS, ROUTINE W REFLEX MICROSCOPIC    EKG EKG Interpretation  Date/Time:  Monday April 17 2020 07:35:42 EDT Ventricular Rate:  82 PR Interval:    QRS Duration: 111 QT Interval:  389 QTC Calculation: 455 R Axis:   19 Text Interpretation: Sinus rhythm Abnormal R-wave progression, early transition No significant change since last tracing Confirmed by Gwyneth Sprout ([id number]) on 04/17/2020 8:33:01 AM   Radiology CT ABDOMEN PELVIS W CONTRAST  Result Date: 04/17/2020 CLINICAL DATA:  Intermittent upper abdominal pain for 1 month, epigastric pain for 3 weeks, history of gallstones EXAM: CT ABDOMEN AND PELVIS WITH CONTRAST TECHNIQUE: Multidetector CT imaging of the abdomen and pelvis was performed using the standard protocol following bolus administration of intravenous contrast. CONTRAST:  <See Chart> OMNIPAQUE IOHEXOL 350 MG/ML SOLN, OMNIPAQUE IOHEXOL 300 MG/ML SOLN COMPARISON:  04/20/2013 FINDINGS: Lower chest: Scattered areas of airspace disease are seen at  the lung bases, right greater than left. Findings could reflect infection or aspiration. Hepatobiliary: Gallbladder is decompressed, with nonspecific gallbladder wall thickening. Multiple calcified gallstones are identified. The liver is grossly unremarkable without focal abnormality. Pancreas: Pancreatic parenchyma enhances normally. There is mild fat stranding surrounding the pancreatic tail which could reflect acute uncomplicated pancreatitis. No fluid collection, pseudocyst, or abscess. Please correlate with laboratory analysis. Spleen: Normal in size without focal abnormality. Adrenals/Urinary Tract: There are bilateral nonobstructing renal calculi, measuring up to 5 mm in the upper pole left kidney and 7 mm lower pole right kidney. Scattered bilateral renal cortical cysts are noted. The adrenals are normal. No obstructive uropathy. The bladder is decompressed with a Foley catheter, limiting evaluation. There are 3 distinct large bladder calculi, largest measuring 3.1 cm. Stomach/Bowel: No bowel obstruction or ileus. The appendix is surgically absent. No bowel wall thickening or inflammatory change. Moderate retained stool. Vascular/Lymphatic: Aortic atherosclerosis. Approximately 50% stenosis at the origin of the right common iliac artery. Please correlate with any symptoms of right lower extremity claudication. No pathologic adenopathy. Reproductive: Prostate is unremarkable. Other: No abdominal wall hernia or abnormality. No abdominopelvic ascites. Musculoskeletal: No acute or destructive bony lesions. Reconstructed images demonstrate no additional findings. IMPRESSION: 1. Mild fat stranding surrounding the pancreatic tail could reflect acute uncomplicated pancreatitis. Please correlate with laboratory analysis. 2. Cholelithiasis, with nonspecific gallbladder wall thickening given incomplete distension. No pericholecystic inflammatory changes to suggest cholecystitis. 3. Bilateral nonobstructing renal  calculi. 4. Bladder calculi. 5. Approximately 50% stenosis at the origin of the right common iliac artery. Please correlate with any symptoms of right lower extremity claudication. 6. Scattered areas of airspace disease at the lung bases, right greater than left, could reflect infection or aspiration. 7. Aortic Atherosclerosis (ICD10-I70.0). Electronically Signed   By: Maxwell Caul.D.  On: 04/17/2020 10:05    Procedures Procedures (including critical care time)  Medications Ordered in ED Medications  sodium chloride flush (NS) 0.9 % injection 3 mL (3 mLs Intravenous Given 04/17/20 1009)  lactated ringers bolus 1,000 mL (1,000 mLs Intravenous New Bag/Given 04/17/20 1009)  iohexol (OMNIPAQUE) 300 MG/ML solution 100 mL (100 mLs Intravenous Contrast Given 04/17/20 0940)    ED Course  I have reviewed the triage vital signs and the nursing notes.  Pertinent labs & imaging results that were available during my care of the patient were reviewed by me and considered in my medical decision making (see chart for details).    MDM Rules/Calculators/A&P                      Patient is a 69 year old male presenting with intermittent abdominal pain over the last month but decreased oral intake and more increasing pain in the last 2 to 3 days.  Patient has known cholelithiasis but was not felt to be a good surgical candidate when he saw Dr. Ninfa Linden in the office.  There were also no signs of cholecystitis at the time.  Patient also recently had urosepsis and pneumonia and continues to have symptoms concerning for aspiration intermittently with eating.  Patient has had a cough per wife but he denies any chest pain or shortness of breath.  Patient's lipase today is mildly elevated at 68 and CMP with elevation of LFTs 59 and 67 slightly higher than readings in March of this year.  CBC with normal white blood cell count and hemoglobin.  Concern for possible pneumonia versus acute gallbladder pathology versus  pancreatitis versus electrolyte abnormality.  Given patient has normal renal function and electrolytes that is not the cause.  He does have mild elevated calcium but do not feel that that is the cause of his symptoms today.  CT was ordered and showed mild fat stranding surrounding the pancreatic tail which could reflect uncomplicated pancreatitis and patient's lipase today is elevated.  It also shows cholelithiasis with some nonspecific gallbladder wall thickening but no pericholecystic inflammatory changes.  Also scattered areas of airspace disease in the lung bases on the right greater than left which could also be a source of his symptoms.  Concerned that maybe has a stone in the duct that could be causing the mild pancreatitis.  He was given IV fluids and will get a right upper quadrant ultrasound to evaluate the bile duct to ensure no concern for retained stone.  11:41 AM Urine is nitrite and leukocyte positive with greater than 50 white blood cells but few bacteria and a culture was sent.  X-ray and CT both are consistent with pneumonia which is most likely aspiration pneumonia.  This could be the source of his decreased appetite generalized weakness however also patient has mild pancreatitis today but ultrasound shows a normal common bile duct diameter and no significant findings for cholecystitis.  Patient was covered with broad-spectrum antibiotics and given IV fluids.  Will admit for further care.  MDM Number of Diagnoses or Management Options Upper abdominal pain: new, needed workup   Amount and/or Complexity of Data Reviewed Clinical lab tests: ordered and reviewed Tests in the radiology section of CPT: ordered and reviewed Tests in the medicine section of CPT: ordered and reviewed Decide to obtain previous medical records or to obtain history from someone other than the patient: yes Obtain history from someone other than the patient: yes Review and summarize past medical records:  yes Discuss the patient with other providers: yes Independent visualization of images, tracings, or specimens: yes  Risk of Complications, Morbidity, and/or Mortality Presenting problems: moderate Diagnostic procedures: low Management options: low  Patient Progress Patient progress: stable    Final Clinical Impression(s) / ED Diagnoses Final diagnoses:  Upper abdominal pain  Acute pancreatitis without infection or necrosis, unspecified pancreatitis type  Aspiration pneumonia of both lower lobes, unspecified aspiration pneumonia type [facility])    Rx / DC Orders ED Discharge Orders    None       Gwyneth Sprout, MD 04/17/20 1143    Gwyneth Sprout, MD 04/17/20 1157

## 2020-04-17 NOTE — ED Triage Notes (Addendum)
Pt here via [facility] for epigastric pain x 3 weeks.  States pain is same as previous gallstone exacerbations.  Pt bedridden d/t MS.  AO x 4.  Foley in place.  States has not been able to eat solid foods for quite some time d/t pain.  States loss of taste x 1 week.

## 2020-04-17 NOTE — Consult Note (Addendum)
Stephen Wade 10/06/1951  [id number].    Requesting MD: Dr. Tamala Julian Chief Complaint/Reason for Consult: Epigastric Abdominal Pain/Gallstones  HPI: Stephen Wade is a 69 y.o. male with a history of MS with chronic indwelling Foley who presented to the emergency department for abdominal pain.  Patient is well-known to our office and followed by Dr. Ninfa Linden.  He was last seen in the office on 04/03/2020 by Dr. Ninfa Linden for gallstones.  At that time, patient and family had elected to hold off on surgery given patient's underlying MS and follow-up in 1 month to rediscuss, or sooner if patient symptoms worsen.    Patient reports for the last 1 week his symptoms have worsened.  He reports that previously his abdominal pain occurred after dinner and at night when lying down but now has pain throughout the day.  He reports his pain now waxes/wanes and is located in his epigastrium and right upper quadrant without further radiation.  He has associated nausea as well as vomiting.  He reports that he has not been able to eat much over the last 1 week secondary to the pain and nausea. His pain has improved   He was seen in the emergency department for this where he was found to have pancreatitis with mild fat stranding surrounding the pancreatic tail as well as cholelithiasis on CT.  Right upper quadrant ultrasound with cholelithiasis and CBD 4 mm.  WBC 5.1.  AST 59, ALT 67, alk phos 197, T bili 0.8.  Lipase 68.  We are asked to see.  Patient also complains of cough and phlegm.  He was noted to have possible aspiration pneumonia changes on chest x-ray.  Also has questionable UTI.  Urine culture pending.  He has not noted any changes in the characteristics of his urine.  He is currently on Rocephin and azithromycin. TRH admitted.  Patient is not on blood thinners at home.  Prior abdominal surgeries include laparoscopic appendectomy.  Patient lives at home with his wife.  He primarily mobilizes using a  scooter.  He is retired.  No alcohol, tobacco or illicit drug use.  ROS: Review of Systems  Constitutional: Negative for chills and fever.  Respiratory: Positive for cough. Negative for shortness of breath.   Cardiovascular: Negative for chest pain and leg swelling.  Gastrointestinal: Positive for abdominal pain, nausea and vomiting. Negative for constipation and diarrhea.  Genitourinary: Negative for hematuria.  Psychiatric/Behavioral: Negative for substance abuse.  All other systems reviewed and are negative.   Family History  Problem Relation Age of Onset  . Heart attack Mother   . Brain cancer Father   . Colon cancer Neg Hx   . Prostate cancer Neg Hx     Past Medical History:  Diagnosis Date  . GERD (gastroesophageal reflux disease)    had on- off chest pain, symptoms resolved with Prilosec 11/2010  . Lower urinary tract symptoms (LUTS)    Dr Gaynelle Arabian  . MS (multiple sclerosis) ([place]) 2005  . Testicular hypogonadism   . Urge incontinence of urine   . Urolithiasis 2011    Past Surgical History:  Procedure Laterality Date  . APPENDECTOMY      ruptured 03-2010  . VASECTOMY      Social History:  reports that he has quit smoking. He has never used smokeless tobacco. He reports that he does not drink alcohol or use drugs.  Allergies: No Known Allergies  (Not in a hospital admission)    Physical Exam:  Blood pressure 119/73, pulse 79, temperature (!) 96.1 F (35.6 C), temperature source Axillary, resp. rate 16, height 6' (1.829 m), weight 88.5 kg, SpO2 95 %. General: pleasant, WD/WN white male who is laying in bed in NAD HEENT: head is normocephalic, atraumatic.  Sclera are noninjected.  PERRL.  Ears and nose without any masses or lesions.  Mouth is pink and moist. Dentition fair Neck: Supple, no thyromegaly, no LAD, no stridor.  Heart: regular, rate, and rhythm.  Normal s1,s2. No obvious murmurs, gallops, or rubs noted.  Palpable radial and DP pulses bilaterally.  Distal extremities are warm.  Lungs: Rhonchi noted at the bases. Upper lung fields clear.  Respiratory effort nonlabored Abd: Soft, ND, tenderness of the epigastrium and RUQ without r/r/g. +BS, no masses, hernias, or organomegaly. Prior laparoscopic scars noted and well healed.  MS: Moves upper extremities. Weakness is present in the bilateral lower extremities b/l. Able to move toes of the lower extremities. no BUE/BLE edema, calves soft and nontender.  Skin: warm and dry with no masses, lesions, or rashes Psych: A&Ox4 with an appropriate affect Neuro: Speech is slow and slightly slurred. Thought process intact. Unable to assess gait.    Results for orders placed or performed during the hospital encounter of 04/17/20 (from the past 48 hour(s))  Lipase, blood     Status: Abnormal   Collection Time: 04/17/20  7:33 AM  Result Value Ref Range   Lipase 68 (H) 11 - 51 U/L    Comment: Performed at [facility] [address] [place], [place] [address]  Comprehensive metabolic panel     Status: Abnormal   Collection Time: 04/17/20  7:33 AM  Result Value Ref Range   Sodium 145 135 - 145 mmol/L   Potassium 4.1 3.5 - 5.1 mmol/L   Chloride 107 98 - 111 mmol/L   CO2 27 22 - 32 mmol/L   Glucose, Bld 74 70 - 99 mg/dL    Comment: Glucose reference range applies only to samples taken after fasting for at least 8 hours.   BUN 23 8 - 23 mg/dL   Creatinine, Ser 0.82 0.61 - 1.24 mg/dL   Calcium 10.4 (H) 8.9 - 10.3 mg/dL   Total Protein 7.1 6.5 - 8.1 g/dL   Albumin 3.4 (L) 3.5 - 5.0 g/dL   AST 59 (H) 15 - 41 U/L   ALT 67 (H) 0 - 44 U/L   Alkaline Phosphatase 197 (H) 38 - 126 U/L   Total Bilirubin 0.8 0.3 - 1.2 mg/dL   GFR calc non Af Amer >60 >60 mL/min   GFR calc Af Amer >60 >60 mL/min   Anion gap 11 5 - 15    Comment: Performed at [facility] [address], [place], [place] [address]  CBC     Status: Abnormal   Collection Time: 04/17/20  7:33 AM  Result Value Ref Range   WBC 5.1  4.0 - 10.5 K/uL   RBC 4.53 4.22 - 5.81 MIL/uL   Hemoglobin 13.4 13.0 - 17.0 g/dL   HCT 41.7 39.0 - 52.0 %   MCV 92.1 80.0 - 100.0 fL   MCH 29.6 26.0 - 34.0 pg   MCHC 32.1 30.0 - 36.0 g/dL   RDW 16.2 (H) 11.5 - 15.5 %   Platelets 226 150 - 400 K/uL   nRBC 0.0 0.0 - 0.2 %    Comment: Performed at [facility], [place] [address], [place], [place] [address]  Urinalysis,  Routine w reflex microscopic     Status: Abnormal   Collection Time: 04/17/20 10:24 AM  Result Value Ref Range   Color, Urine YELLOW YELLOW   APPearance CLOUDY (A) CLEAR   Specific Gravity, Urine <1.005 (L) 1.005 - 1.030   pH 7.5 5.0 - 8.0   Glucose, UA NEGATIVE NEGATIVE mg/dL   Hgb urine dipstick SMALL (A) NEGATIVE   Bilirubin Urine NEGATIVE NEGATIVE   Ketones, ur NEGATIVE NEGATIVE mg/dL   Protein, ur NEGATIVE NEGATIVE mg/dL   Nitrite POSITIVE (A) NEGATIVE   Leukocytes,Ua MODERATE (A) NEGATIVE    Comment: Performed at [facility] [address] [place], [place] [address]  Urinalysis, Microscopic (reflex)     Status: Abnormal   Collection Time: 04/17/20 10:24 AM  Result Value Ref Range   RBC / HPF 6-10 0 - 5 RBC/hpf   WBC, UA 11-20 0 - 5 WBC/hpf   Bacteria, UA FEW (A) NONE SEEN   Squamous Epithelial / LPF NONE SEEN 0 - 5    Comment: Performed at [facility], [place] [address] [place], [place] [address]  Respiratory Panel by RT PCR (Flu A&B, Covid) - Nasopharyngeal Swab     Status: None   Collection Time: 04/17/20  1:56 PM   Specimen: Nasopharyngeal Swab  Result Value Ref Range   SARS Coronavirus 2 by RT PCR NEGATIVE NEGATIVE    Comment: (NOTE) SARS-CoV-2 target nucleic acids are NOT DETECTED. The SARS-CoV-2 RNA is generally detectable in upper respiratoy specimens during the acute phase of infection. The lowest concentration of SARS-CoV-2 viral copies this assay can detect is 131 copies/mL. A negative result does not preclude SARS-Cov-2 infection and should not be used as the sole basis for  treatment or other patient management decisions. A negative result may occur with  improper specimen collection/handling, submission of specimen other than nasopharyngeal swab, presence of viral mutation(s) within the areas targeted by this assay, and inadequate number of viral copies (<131 copies/mL). A negative result must be combined with clinical observations, patient history, and epidemiological information. The expected result is Negative. Fact Sheet for Patients:  PinkCheek.be Fact Sheet for Healthcare Providers:  GravelBags.it This test is not yet ap proved or cleared by the [place] FDA and  has been authorized for detection and/or diagnosis of SARS-CoV-2 by FDA under an Emergency Use Authorization (EUA). This EUA will remain  in effect (meaning this test can be used) for the duration of the COVID-19 declaration under Section 564(b)(1) of the Act, 21 U.S.C. section 360bbb-3(b)(1), unless the authorization is terminated or revoked sooner.    Influenza A by PCR NEGATIVE NEGATIVE   Influenza B by PCR NEGATIVE NEGATIVE    Comment: (NOTE) The Xpert Xpress SARS-CoV-2/FLU/RSV assay is intended as an aid in  the diagnosis of influenza from Nasopharyngeal swab specimens and  should not be used as a sole basis for treatment. Nasal washings and  aspirates are unacceptable for Xpert Xpress SARS-CoV-2/FLU/RSV  testing. Fact Sheet for Patients: PinkCheek.be Fact Sheet for Healthcare Providers: GravelBags.it This test is not yet approved or cleared by the [place] FDA and  has been authorized for detection and/or diagnosis of SARS-CoV-2 by  FDA under an Emergency Use Authorization (EUA). This EUA will remain  in effect (meaning this test can be used) for the duration of the  Covid-19 declaration under Section 564(b)(1) of the Act, 21  U.S.C. section  360bbb-3(b)(1), unless the authorization is  terminated or revoked. Performed at [facility], [place] [address] [place], [place] [address]    CT ABDOMEN PELVIS W CONTRAST  Result Date: 04/17/2020 CLINICAL DATA:  Intermittent upper abdominal pain for 1 month, epigastric pain for 3 weeks, history of gallstones EXAM: CT ABDOMEN AND PELVIS WITH CONTRAST TECHNIQUE: Multidetector CT imaging of the abdomen and pelvis was performed using the standard protocol following bolus administration of intravenous contrast. CONTRAST:  <See Chart> OMNIPAQUE IOHEXOL 350 MG/ML SOLN, 148m OMNIPAQUE IOHEXOL 300 MG/ML SOLN COMPARISON:  04/20/2013 FINDINGS: Lower chest: Scattered areas of airspace disease are seen at the lung bases, right greater than left. Findings could reflect infection or aspiration. Hepatobiliary: Gallbladder is decompressed, with nonspecific gallbladder wall thickening. Multiple calcified gallstones are identified. The liver is grossly unremarkable without focal abnormality. Pancreas: Pancreatic parenchyma enhances normally. There is mild fat stranding surrounding the pancreatic tail which could reflect acute uncomplicated pancreatitis. No fluid collection, pseudocyst, or abscess. Please correlate with laboratory analysis. Spleen: Normal in size without focal abnormality. Adrenals/Urinary Tract: There are bilateral nonobstructing renal calculi, measuring up to 5 mm in the upper pole left kidney and 7 mm lower pole right kidney. Scattered bilateral renal cortical cysts are noted. The adrenals are normal. No obstructive uropathy. The bladder is decompressed with a Foley catheter, limiting evaluation. There are 3 distinct large bladder calculi, largest measuring 3.1 cm. Stomach/Bowel: No bowel obstruction or ileus. The appendix is surgically absent. No bowel wall thickening or inflammatory change. Moderate retained stool. Vascular/Lymphatic: Aortic atherosclerosis. Approximately 50% stenosis at the origin  of the right common iliac artery. Please correlate with any symptoms of right lower extremity claudication. No pathologic adenopathy. Reproductive: Prostate is unremarkable. Other: No abdominal wall hernia or abnormality. No abdominopelvic ascites. Musculoskeletal: No acute or destructive bony lesions. Reconstructed images demonstrate no additional findings. IMPRESSION: 1. Mild fat stranding surrounding the pancreatic tail could reflect acute uncomplicated pancreatitis. Please correlate with laboratory analysis. 2. Cholelithiasis, with nonspecific gallbladder wall thickening given incomplete distension. No pericholecystic inflammatory changes to suggest cholecystitis. 3. Bilateral nonobstructing renal calculi. 4. Bladder calculi. 5. Approximately 50% stenosis at the origin of the right common iliac artery. Please correlate with any symptoms of right lower extremity claudication. 6. Scattered areas of airspace disease at the lung bases, right greater than left, could reflect infection or aspiration. 7. Aortic Atherosclerosis (ICD10-I70.0). Electronically Signed   By: MRanda NgoM.D.   On: 04/17/2020 10:05   DG Chest Port 1 View  Result Date: 04/17/2020 CLINICAL DATA:  Cough EXAM: PORTABLE CHEST 1 VIEW COMPARISON:  12/13/2019 FINDINGS: Low volume chest with interstitial coarsening at the bases that was also seen previously but correlates with airspace disease by CT from earlier today. Normal heart size and mediastinal contours. No visible effusion or pneumothorax. Artifact from EKG leads. IMPRESSION: Low volume chest with subtle infiltrates at the bases. This correlates with atelectasis and airspace disease by abdominal CT earlier today. A similar pattern was seen December 2020, question recurrent aspiration. Electronically Signed   By: JMonte FantasiaM.D.   On: 04/17/2020 11:24   [place]Abdomen Limited RUQ  Result Date: 04/17/2020 CLINICAL DATA:  Upper abdominal pain, cholelithiasis EXAM: ULTRASOUND ABDOMEN  LIMITED RIGHT UPPER QUADRANT COMPARISON:  04/17/2020 FINDINGS: Gallbladder: Multiple shadowing gallstones are identified. Gallbladder is incompletely distended which limits evaluation. No evidence of gallbladder wall thickening or pericholecystic fluid. Common bile duct: Diameter: 4 mm Liver: No focal lesion identified. Within normal limits in parenchymal echogenicity. Portal vein is patent on color Doppler imaging with normal direction of  blood flow towards the liver. Other: None. IMPRESSION: 1. Cholelithiasis without cholecystitis. Electronically Signed   By: Randa Ngo M.D.   On: 04/17/2020 11:07   Anti-infectives (From admission, onward)   Start     Dose/Rate Route Frequency Ordered Stop   04/18/20 1000  cefTRIAXone (ROCEPHIN) 2 g in sodium chloride 0.9 % 100 mL IVPB     2 g 200 mL/hr over 30 Minutes Intravenous Every 24 hours 04/17/20 1437     04/18/20 1000  azithromycin (ZITHROMAX) 500 mg in sodium chloride 0.9 % 250 mL IVPB     500 mg 250 mL/hr over 60 Minutes Intravenous Every 24 hours 04/17/20 1437     04/17/20 1145  cefTRIAXone (ROCEPHIN) 1 g in sodium chloride 0.9 % 100 mL IVPB     1 g 200 mL/hr over 30 Minutes Intravenous  Once 04/17/20 1141 04/17/20 1415   04/17/20 1145  azithromycin (ZITHROMAX) 500 mg in sodium chloride 0.9 % 250 mL IVPB     500 mg 250 mL/hr over 60 Minutes Intravenous  Once 04/17/20 1141       Assessment/Plan MS Stenosis of right common iliac noted on CT ? UTI Aspiration PNA - Above per TRH -   Biliary Pancreatitis  -Patient with no alcohol use/history. Lipase today only mildly elevated at 68.  T bili and CBD within normal limits.  No indication for MRCP at this time.   -  I suspect source of patients pain/pancreatitis is from his gallstones.  I have explained the procedure, risks, and aftercare of cholecystectomy.  Risks include but are not limited to bleeding, infection, wound problems, anesthesia (MI, CVA, demise), diarrhea, bile leak, or injury to  common bile duct/liver/intestine.  He seems to understand and agrees to proceed. Will plan for OR in the AM. - Trend Lipase and LFT's   FEN - CLD now, NPO at midnight VTE - SCDs, Lovenox ID - On Rocephin/Azithromycin   Jillyn Ledger, [facility] Surgery 04/17/2020, 3:44 PM Please see Amion for pager number during day hours 7:00am-4:30pm

## 2020-04-17 NOTE — H&P (Signed)
History and Physical    Stephen Wade:[id number] DOB: [birth year] DOA: 04/17/2020  Referring MD/NP/PA: Elna Breslow, MD PCP: Wanda Plump, MD  Patient coming from: Home via EMS  Chief Complaint: Abdominal pain  I have personally briefly reviewed patient's old medical records in [facility]   HPI: Stephen Wade is a 69 y.o. male with medical history significant of severe MS bedbound with chronic indwelling Foley catheter, cholelithiasis, paroxysmal atrial fibrillation not on anticoagulation, and GERD presents with complaints of abdominal pain. History is obtained from the patient with the assistance of his wife.  Last hospitalized with sepsis secondary to group B strep UTI and aspiration pneumonia in December 2020.  During that hospitalization patient was noted to have slurred speech and drooling, but MRI imaging showed no acute changes.  He was discharged to inpatient rehab until going home on 12/31/2019.  Wife makes notes he has had intermittent abdominal pain since that time.  For the last week he has been not eating much of anything and more lethargic.  Pain is located in the epigastric and left upper quadrant region.  Symptoms usually occurred after attempting to eat, but now appear to be constant.  Associated symptoms include nausea, vomiting, increased urine sediment, and cough.  She has been flushing his Foley catheter twice daily with water and vinegar solution.  Patient has also been getting Foley catheter changed out every week.  He had been evaluated by Dr. Magnus Ivan of general surgery as he was seen during that previous hospitalization to have cholelithiasis without signs of cholecystitis.  Wife makes note that they decided not to undergo surgery at that time due to his poor overall functional status.  In regards to patient's overall CODE STATUS records note that patient was previously DNR.  Husband and wife agreed that they would like to DNR status taken away and to make him a full  code. Patient has been noted to aspirate, but he does not want to be on a modified diet even though this could put him at increased risks.  He is also followed in the outpatient setting by palliative care.  ED Course: Upon admission into the emergency department patient was noted to be afebrile with vital signs relatively within normal limits.  Labs significant for calcium 10.4, alkaline phosphatase 197, lipase 68, AST 59, and ALT 67.  Imaging studies significant for signs of pancreatitis, cholelithiasis, and bilateral lower lobe infiltrates right lung worse than left.  Patient had been given 1 L of lactated Ringer's, Rocephin, and azithromycin.  TRH called to admit.  Review of Systems  Constitutional: Negative for chills and fever.  Eyes: Negative for photophobia and pain.  Respiratory: Positive for cough.   Gastrointestinal: Positive for abdominal pain, nausea and vomiting.  Neurological: Positive for weakness.  Otherwise a complete 10 point review of systems was performed and negative except for as noted here or above in the HPI.  Past Medical History:  Diagnosis Date  . GERD (gastroesophageal reflux disease)    had on- off chest pain, symptoms resolved with Prilosec 11/2010  . Lower urinary tract symptoms (LUTS)    Dr Patsi Sears  . MS (multiple sclerosis) (HCC) 2005  . Testicular hypogonadism   . Urge incontinence of urine   . Urolithiasis 2011    Past Surgical History:  Procedure Laterality Date  . APPENDECTOMY      ruptured 03-2010  . VASECTOMY       reports that he has quit smoking. He has  never used smokeless tobacco. He reports that he does not drink alcohol or use drugs.  No Known Allergies  Family History  Problem Relation Age of Onset  . Heart attack Mother   . Brain cancer Father   . Colon cancer Neg Hx   . Prostate cancer Neg Hx     Prior to Admission medications   Medication Sig Start Date End Date Taking? Authorizing Provider  baclofen (LIORESAL) 10 MG  tablet Take 1 tablet (10 mg total) by mouth 3 (three) times daily. Patient taking differently: Take 10 mg by mouth 3 (three) times daily as needed.  12/30/19 12/29/20  Angiulli, Mcarthur Rossetti, PA-C  Teriflunomide (AUBAGIO) 14 MG TABS Take by mouth.    [provider]    Physical Exam:  Constitutional: Elderly male who appears to be in no acute distress at this time Vitals:   04/17/20 0725 04/17/20 0726  BP: 123/74   Pulse: 88   Resp: 16   Temp: (!) 96.1 F (35.6 C)   TempSrc: Axillary   SpO2: 97%   Weight:  88.5 kg  Height:  6' (1.829 m)   Eyes: PERRL, lids and conjunctivae normal ENMT: Mucous membranes are dry . Posterior pharynx clear of any exudate or lesions.  Neck: normal, supple, no masses, no thyromegaly Respiratory: Bilateral rhonchi appreciated in the lower lung fields.  Patient O2 saturations maintained on room air. Cardiovascular: Regular rate and rhythm, no murmurs / rubs / gallops. No extremity edema. 2+ pedal pulses. No carotid bruits.  Abdomen: Left upper quadrant tenderness to palpation., no masses palpated. No hepatosplenomegaly. Bowel sounds positive.  Musculoskeletal: no clubbing / cyanosis.  Atrophy noted of the bilateral lower extremities Skin: no rashes, lesions, ulcers. No induration Neurologic: CN 2-12 grossly intact.  Decreased strength of lower extremities.  Patient speech is slowed and slurred Psychiatric: Normal judgment and insight. Alert and oriented x 3. Normal mood.     Labs on Admission: I have personally reviewed following labs and imaging studies  CBC: Recent Labs  Lab 04/17/20 0733  WBC 5.1  HGB 13.4  HCT 41.7  MCV 92.1  PLT 226   Basic Metabolic Panel: Recent Labs  Lab 04/17/20 0733  NA 145  K 4.1  CL 107  CO2 27  GLUCOSE 74  BUN 23  CREATININE 0.82  CALCIUM 10.4*   GFR: Estimated Creatinine Clearance: 95.9 mL/min (by C-G formula based on SCr of 0.82 mg/dL). Liver Function Tests: Recent Labs  Lab 04/17/20 0733  AST  59*  ALT 67*  ALKPHOS 197*  BILITOT 0.8  PROT 7.1  ALBUMIN 3.4*   Recent Labs  Lab 04/17/20 0733  LIPASE 68*   No results for input(s): AMMONIA in the last 168 hours. Coagulation Profile: No results for input(s): INR, PROTIME in the last 168 hours. Cardiac Enzymes: No results for input(s): CKTOTAL, CKMB, CKMBINDEX, TROPONINI in the last 168 hours. BNP (last 3 results) No results for input(s): PROBNP in the last 8760 hours. HbA1C: No results for input(s): HGBA1C in the last 72 hours. CBG: No results for input(s): GLUCAP in the last 168 hours. Lipid Profile: No results for input(s): CHOL, HDL, LDLCALC, TRIG, CHOLHDL, LDLDIRECT in the last 72 hours. Thyroid Function Tests: No results for input(s): TSH, T4TOTAL, FREET4, T3FREE, THYROIDAB in the last 72 hours. Anemia Panel: No results for input(s): VITAMINB12, FOLATE, FERRITIN, TIBC, IRON, RETICCTPCT in the last 72 hours. Urine analysis:    Component Value Date/Time   COLORURINE YELLOW 04/17/2020 1024  APPEARANCEUR CLOUDY (A) 04/17/2020 1024   LABSPEC <1.005 (L) 04/17/2020 1024   PHURINE 7.5 04/17/2020 1024   GLUCOSEU NEGATIVE 04/17/2020 1024   GLUCOSEU NEGATIVE 03/11/2013 1106   HGBUR SMALL (A) 04/17/2020 1024   HGBUR large 10/05/2010 1242   BILIRUBINUR NEGATIVE 04/17/2020 1024   BILIRUBINUR neg 03/11/2013 1039   KETONESUR NEGATIVE 04/17/2020 1024   PROTEINUR NEGATIVE 04/17/2020 1024   UROBILINOGEN 0.2 03/17/2013 1005   NITRITE POSITIVE (A) 04/17/2020 1024   LEUKOCYTESUR MODERATE (A) 04/17/2020 1024   Sepsis Labs: No results found for this or any previous visit (from the past 240 hour(s)).   Radiological Exams on Admission: CT ABDOMEN PELVIS W CONTRAST  Result Date: 04/17/2020 CLINICAL DATA:  Intermittent upper abdominal pain for 1 month, epigastric pain for 3 weeks, history of gallstones EXAM: CT ABDOMEN AND PELVIS WITH CONTRAST TECHNIQUE: Multidetector CT imaging of the abdomen and pelvis was performed using the  standard protocol following bolus administration of intravenous contrast. CONTRAST:  <See Chart> OMNIPAQUE IOHEXOL 350 MG/ML SOLN, OMNIPAQUE IOHEXOL 300 MG/ML SOLN COMPARISON:  04/20/2013 FINDINGS: Lower chest: Scattered areas of airspace disease are seen at the lung bases, right greater than left. Findings could reflect infection or aspiration. Hepatobiliary: Gallbladder is decompressed, with nonspecific gallbladder wall thickening. Multiple calcified gallstones are identified. The liver is grossly unremarkable without focal abnormality. Pancreas: Pancreatic parenchyma enhances normally. There is mild fat stranding surrounding the pancreatic tail which could reflect acute uncomplicated pancreatitis. No fluid collection, pseudocyst, or abscess. Please correlate with laboratory analysis. Spleen: Normal in size without focal abnormality. Adrenals/Urinary Tract: There are bilateral nonobstructing renal calculi, measuring up to 5 mm in the upper pole left kidney and 7 mm lower pole right kidney. Scattered bilateral renal cortical cysts are noted. The adrenals are normal. No obstructive uropathy. The bladder is decompressed with a Foley catheter, limiting evaluation. There are 3 distinct large bladder calculi, largest measuring 3.1 cm. Stomach/Bowel: No bowel obstruction or ileus. The appendix is surgically absent. No bowel wall thickening or inflammatory change. Moderate retained stool. Vascular/Lymphatic: Aortic atherosclerosis. Approximately 50% stenosis at the origin of the right common iliac artery. Please correlate with any symptoms of right lower extremity claudication. No pathologic adenopathy. Reproductive: Prostate is unremarkable. Other: No abdominal wall hernia or abnormality. No abdominopelvic ascites. Musculoskeletal: No acute or destructive bony lesions. Reconstructed images demonstrate no additional findings. IMPRESSION: 1. Mild fat stranding surrounding the pancreatic tail could reflect acute  uncomplicated pancreatitis. Please correlate with laboratory analysis. 2. Cholelithiasis, with nonspecific gallbladder wall thickening given incomplete distension. No pericholecystic inflammatory changes to suggest cholecystitis. 3. Bilateral nonobstructing renal calculi. 4. Bladder calculi. 5. Approximately 50% stenosis at the origin of the right common iliac artery. Please correlate with any symptoms of right lower extremity claudication. 6. Scattered areas of airspace disease at the lung bases, right greater than left, could reflect infection or aspiration. 7. Aortic Atherosclerosis (ICD10-I70.0). Electronically Signed   By: Sharlet Salina M.D.   On: 04/17/2020 10:05   DG Chest Port 1 View  Result Date: 04/17/2020 CLINICAL DATA:  Cough EXAM: PORTABLE CHEST 1 VIEW COMPARISON:  12/13/2019 FINDINGS: Low volume chest with interstitial coarsening at the bases that was also seen previously but correlates with airspace disease by CT from earlier today. Normal heart size and mediastinal contours. No visible effusion or pneumothorax. Artifact from EKG leads. IMPRESSION: Low volume chest with subtle infiltrates at the bases. This correlates with atelectasis and airspace disease by abdominal CT earlier today. A similar pattern was  seen December 2020, question recurrent aspiration. Electronically Signed   By: Monte Fantasia M.D.   On: 04/17/2020 11:24   US Abdomen Limited RUQ  Result Date: 04/17/2020 CLINICAL DATA:  Upper abdominal pain, cholelithiasis EXAM: ULTRASOUND ABDOMEN LIMITED RIGHT UPPER QUADRANT COMPARISON:  04/17/2020 FINDINGS: Gallbladder: Multiple shadowing gallstones are identified. Gallbladder is incompletely distended which limits evaluation. No evidence of gallbladder wall thickening or pericholecystic fluid. Common bile duct: Diameter: 4 mm Liver: No focal lesion identified. Within normal limits in parenchymal echogenicity. Portal vein is patent on color Doppler imaging with normal direction of  blood flow towards the liver. Other: None. IMPRESSION: 1. Cholelithiasis without cholecystitis. Electronically Signed   By: Randa Ngo M.D.   On: 04/17/2020 11:07    EKG: Independently reviewed.  Sinus rhythm at 82 bpm  Assessment/Plan Suspected biliary pancreatitis: Acute.  Presents with complaints of epigastric abdominal pain CT imaging of the abdomen and pelvis significant for signs of acute pancreatitis and cholelithiasis without cholecystitis.  Lipase elevated at 68 with elevated LFTs.  Suspect symptoms secondary to possible gallstone pancreatitis.  Family reports that they are willing to undergo cholecystectomy although previously had deferred. -Admit to a medical telemetry bed -Advance diet as tolerated when medically appropriate -Check lipid panel  -IV fentanyl as needed for severe pain control -Lactated Ringer's at 125 mL/h -Dr. Ninfa Linden of general surgery consulted, will follow-up for further recommendation  Aspiration pneumonia, dysphagia: Acute on chronic.  Imaging studies concerning for airspace opacities in the bilateral lower lungs right worse than left.  Patient declines being on a modified diet. -Aspiration precautions with elevation of head of the bed -Empiric antibiotics of Rocephin and azithromycin -De-escalate antibiotics when medically appropriate -May benefit from speech therapy consult on techniques to decrease aspiration risk  Chronic indwelling Foley catheter, possible UTI vs. asymptomatic bacteriuria: Acute on chronic.  Patient has chronic indwelling Foley catheter due to neurogenic bladder with prior history of recurrent UTIs.  His wife has noted increased sediment in his urine.  She has been flushing the catheter twice daily with water and vinegar solution.  He has also been having the catheter changed out weekly. -Follow-up urine culture -Continue twice daily for Foley catheter flushes -Antibiotics as seen above  Hypercalcemia: Acute. Calcium elevated at  10.4.  Patient on calcium carbonate (Tums) as needed for indigestion. -IV fluids as seen above -Hold calcium carbonate  -Recheck calcium levels in a.m. -May need to further discuss his use of calcium carbonate as this may be a likely cause of his elevated calcium levels in addition to decreased p.o. intake  Transaminitis: Acute.  Liver enzymes mildly elevated including alkaline phosphatase 197, AST 59, and ALT 67.  Suspect secondary to gallstone. -Recheck CMP in a.m.  Multiple sclerosis with spastic gait: Patient is significantly debilitated and known to be wheelchair/bedbound at baseline.  Home medications include baclofen 10 mg 3 times daily as needed for muscle spasms and Teriflunomide 14 mg daily. -Continue home medication regimen  Pressure ulcer of the sacrum: Acute on chronic.  Stage II pressure ulcer of the sacrum. -Low-air-loss bed replacement -Wound care consult  Paroxysmal atrial fibrillation: Patient currently in sinus rhythm.CHA2DS2-VASc score = 1. -Continue to monitor  Palliative care patient: On patient's chart notes that he is on hospice, but wife reports that he is under palliative care.  Nephrolithiasis, bladder calculi: Incidental finding seen on CT that are nonobstructing.    DVT prophylaxis: Lovenox Code Status: DNR Family Communication: Discussed plan of care with the patient's wife  present at bedside Disposition Plan: Likely discharge back home once medically stable Consults called: Surgery Admission status: Inpatient  Clydie Braun MD [facility] Pager [phone]   If 7PM-7AM, please contact night-coverage [email or web address] Password [facility]  04/17/2020, 11:51 AM

## 2020-04-17 NOTE — Progress Notes (Signed)
Report received from ER RN. Room is ready. 

## 2020-04-17 NOTE — ED Notes (Signed)
Patient transported to CT at this time. 

## 2020-04-18 ENCOUNTER — Encounter (HOSPITAL_COMMUNITY): Payer: Self-pay | Admitting: Internal Medicine

## 2020-04-18 ENCOUNTER — Encounter (HOSPITAL_COMMUNITY)
Admission: EM | Disposition: A | Discharge status: Hospice | Payer: Self-pay | Source: Home / Self Care | Attending: Family Medicine

## 2020-04-18 ENCOUNTER — Inpatient Hospital Stay (HOSPITAL_COMMUNITY): Payer: Medicare Other | Admitting: Anesthesiology

## 2020-04-18 DIAGNOSIS — K851 Biliary acute pancreatitis without necrosis or infection: Secondary | ICD-10-CM | POA: Diagnosis not present

## 2020-04-18 HISTORY — PX: CHOLECYSTECTOMY: SHX55

## 2020-04-18 LAB — COMPREHENSIVE METABOLIC PANEL
ALT: 63 U/L — ABNORMAL HIGH (ref 0–44)
AST: 60 U/L — ABNORMAL HIGH (ref 15–41)
Albumin: 3.1 g/dL — ABNORMAL LOW (ref 3.5–5.0)
Alkaline Phosphatase: 173 U/L — ABNORMAL HIGH (ref 38–126)
Anion gap: 8 (ref 5–15)
BUN: 16 mg/dL (ref 8–23)
CO2: 25 mmol/L (ref 22–32)
Calcium: 9.9 mg/dL (ref 8.9–10.3)
Chloride: 112 mmol/L — ABNORMAL HIGH (ref 98–111)
Creatinine, Ser: 0.82 mg/dL (ref 0.61–1.24)
GFR calc Af Amer: >60 mL/min (ref >60)
GFR calc non Af Amer: >60 mL/min (ref >60)
Glucose, Bld: 69 mg/dL — ABNORMAL LOW (ref 70–99)
Potassium: 4.1 mmol/L (ref 3.5–5.1)
Sodium: 145 mmol/L (ref 135–145)
Total Bilirubin: 0.7 mg/dL (ref 0.3–1.2)
Total Protein: 6.2 g/dL — ABNORMAL LOW (ref 6.5–8.1)

## 2020-04-18 LAB — CBC
HCT: 38.8 % — ABNORMAL LOW (ref 39.0–52.0)
Hemoglobin: 12.4 g/dL — ABNORMAL LOW (ref 13.0–17.0)
MCH: 29 pg (ref 26.0–34.0)
MCHC: 32 g/dL (ref 30.0–36.0)
MCV: 90.7 fL (ref 80.0–100.0)
Platelets: 199 10*3/uL (ref 150–400)
RBC: 4.28 MIL/uL (ref 4.22–5.81)
RDW: 16.1 % — ABNORMAL HIGH (ref 11.5–15.5)
WBC: 5.7 10*3/uL (ref 4.0–10.5)
nRBC: 0 % (ref 0.0–0.2)

## 2020-04-18 LAB — LIPID PANEL
Cholesterol: 197 mg/dL (ref 0–200)
HDL: 35 mg/dL — ABNORMAL LOW (ref >40)
LDL Cholesterol: 136 mg/dL — ABNORMAL HIGH (ref 0–99)
Total CHOL/HDL Ratio: 5.6 RATIO
Triglycerides: 128 mg/dL (ref <150)
VLDL: 26 mg/dL (ref 0–40)

## 2020-04-18 LAB — SURGICAL PCR SCREEN
MRSA, PCR: NEGATIVE
Staphylococcus aureus: POSITIVE — AB

## 2020-04-18 LAB — LIPASE, BLOOD: Lipase: 49 U/L (ref 11–51)

## 2020-04-18 SURGERY — LAPAROSCOPIC CHOLECYSTECTOMY WITH INTRAOPERATIVE CHOLANGIOGRAM
Anesthesia: General

## 2020-04-18 MED ORDER — FENTANYL CITRATE (PF) 100 MCG/2ML IJ SOLN
12.5000 ug | INTRAMUSCULAR | Status: DC | PRN
  Administered 2020-04-20: 12.5 ug via INTRAVENOUS
  Filled 2020-04-18: qty 2

## 2020-04-18 MED ORDER — DEXAMETHASONE SODIUM PHOSPHATE 10 MG/ML IJ SOLN
INTRAMUSCULAR | Status: AC
  Filled 2020-04-18: qty 1

## 2020-04-18 MED ORDER — FENTANYL CITRATE (PF) 100 MCG/2ML IJ SOLN
12.5000 ug | INTRAMUSCULAR | Status: DC | PRN
  Administered 2020-04-18: 09:00:00 50 ug via INTRAVENOUS

## 2020-04-18 MED ORDER — IBUPROFEN 400 MG PO TABS: 400.0000 mg | ORAL_TABLET | Freq: Four times a day (QID) | ORAL | Status: DC | PRN

## 2020-04-18 MED ORDER — LIDOCAINE 2% (20 MG/ML) 5 ML SYRINGE
INTRAMUSCULAR | Status: AC
  Filled 2020-04-18: qty 5

## 2020-04-18 MED ORDER — BUPIVACAINE-EPINEPHRINE 0.25% -1:200000 IJ SOLN
INTRAMUSCULAR | Status: DC | PRN
  Administered 2020-04-18: 20 mL

## 2020-04-18 MED ORDER — PROPOFOL 10 MG/ML IV BOLUS
INTRAVENOUS | Status: AC
  Filled 2020-04-18: qty 20

## 2020-04-18 MED ORDER — ROCURONIUM BROMIDE 10 MG/ML (PF) SYRINGE
PREFILLED_SYRINGE | INTRAVENOUS | Status: AC
  Filled 2020-04-18: qty 10

## 2020-04-18 MED ORDER — ROCURONIUM BROMIDE 10 MG/ML (PF) SYRINGE
PREFILLED_SYRINGE | INTRAVENOUS | Status: DC | PRN
  Administered 2020-04-18: 60 mg via INTRAVENOUS
  Administered 2020-04-18: 10 mg via INTRAVENOUS

## 2020-04-18 MED ORDER — ONDANSETRON HCL 4 MG/2ML IJ SOLN
4.0000 mg | Freq: Once | INTRAMUSCULAR | Status: AC
  Administered 2020-04-18: 4 mg via INTRAVENOUS

## 2020-04-18 MED ORDER — PHENYLEPHRINE 40 MCG/ML (10ML) SYRINGE FOR IV PUSH (FOR BLOOD PRESSURE SUPPORT)
PREFILLED_SYRINGE | INTRAVENOUS | Status: DC | PRN
  Administered 2020-04-18: 120 ug via INTRAVENOUS
  Administered 2020-04-18 (×2): 80 ug via INTRAVENOUS
  Administered 2020-04-18: 120 ug via INTRAVENOUS

## 2020-04-18 MED ORDER — CALCIUM CARBONATE ANTACID 500 MG PO CHEW: 2.0000 | CHEWABLE_TABLET | ORAL | Status: DC | PRN

## 2020-04-18 MED ORDER — MUPIROCIN 2 % EX OINT
1.0000 "application " | TOPICAL_OINTMENT | Freq: Two times a day (BID) | CUTANEOUS | Status: AC
  Administered 2020-04-18 – 2020-04-21 (×9): 1 via NASAL
  Filled 2020-04-18: qty 22

## 2020-04-18 MED ORDER — ONDANSETRON HCL 4 MG/2ML IJ SOLN
INTRAMUSCULAR | Status: DC | PRN
  Administered 2020-04-18: 4 mg via INTRAVENOUS

## 2020-04-18 MED ORDER — CEFAZOLIN SODIUM-DEXTROSE 2-3 GM-%(50ML) IV SOLR
INTRAVENOUS | Status: DC | PRN
  Administered 2020-04-18: 2 g via INTRAVENOUS

## 2020-04-18 MED ORDER — DEXAMETHASONE SODIUM PHOSPHATE 10 MG/ML IJ SOLN
INTRAMUSCULAR | Status: DC | PRN
  Administered 2020-04-18: 4 mg via INTRAVENOUS

## 2020-04-18 MED ORDER — ONDANSETRON HCL 4 MG/2ML IJ SOLN
INTRAMUSCULAR | Status: AC
  Filled 2020-04-18: qty 2

## 2020-04-18 MED ORDER — LIDOCAINE 2% (20 MG/ML) 5 ML SYRINGE
INTRAMUSCULAR | Status: DC | PRN
  Administered 2020-04-18: 60 mg via INTRAVENOUS

## 2020-04-18 MED ORDER — 0.9 % SODIUM CHLORIDE (POUR BTL) OPTIME
TOPICAL | Status: DC | PRN
  Administered 2020-04-18: 1000 mL

## 2020-04-18 MED ORDER — BACLOFEN 10 MG PO TABS: 10.0000 mg | ORAL_TABLET | Freq: Three times a day (TID) | ORAL | Status: DC | PRN

## 2020-04-18 MED ORDER — PHENYLEPHRINE HCL (PRESSORS) 10 MG/ML IV SOLN
INTRAVENOUS | Status: DC | PRN
Start: 2020-04-18 — End: 2020-04-18

## 2020-04-18 MED ORDER — BUPIVACAINE HCL (PF) 0.25 % IJ SOLN
INTRAMUSCULAR | Status: AC
  Filled 2020-04-18: qty 30

## 2020-04-18 MED ORDER — MIDAZOLAM HCL 2 MG/2ML IJ SOLN
INTRAMUSCULAR | Status: AC
  Filled 2020-04-18: qty 2

## 2020-04-18 MED ORDER — TRAMADOL HCL 50 MG PO TABS
50.0000 mg | ORAL_TABLET | Freq: Four times a day (QID) | ORAL | Status: DC | PRN
  Administered 2020-04-19 – 2020-04-22 (×3): 50 mg via ORAL
  Filled 2020-04-18 (×3): qty 1

## 2020-04-18 MED ORDER — FENTANYL CITRATE (PF) 250 MCG/5ML IJ SOLN
INTRAMUSCULAR | Status: AC
  Filled 2020-04-18: qty 5

## 2020-04-18 MED ORDER — TERIFLUNOMIDE 14 MG PO TABS: 1.0000 | ORAL_TABLET | Freq: Every day | ORAL | Status: DC

## 2020-04-18 MED ORDER — GERHARDT'S BUTT CREAM
TOPICAL_CREAM | Freq: Three times a day (TID) | CUTANEOUS | Status: DC
  Filled 2020-04-18: qty 1

## 2020-04-18 MED ORDER — LACTATED RINGERS IV SOLN: INTRAVENOUS | Status: DC

## 2020-04-18 MED ORDER — ALBUMIN HUMAN 5 % IV SOLN: INTRAVENOUS | Status: DC | PRN

## 2020-04-18 MED ORDER — PROPOFOL 10 MG/ML IV BOLUS
INTRAVENOUS | Status: DC | PRN
  Administered 2020-04-18: 150 mg via INTRAVENOUS

## 2020-04-18 MED ORDER — SODIUM CHLORIDE 0.9 % IR SOLN
Status: DC | PRN
  Administered 2020-04-18: 1000 mL

## 2020-04-18 SURGICAL SUPPLY — 47 items
APPLIER CLIP 5 13 M/L LIGAMAX5 (MISCELLANEOUS) ×3
BIOPATCH RED 1 DISK 7.0 (GAUZE/BANDAGES/DRESSINGS) ×2 IMPLANT
BIOPATCH RED 1IN DISK 7.0MM (GAUZE/BANDAGES/DRESSINGS) ×1
BLADE CLIPPER SURG (BLADE) IMPLANT
CANISTER SUCT 3000ML PPV (MISCELLANEOUS) ×3 IMPLANT
CHLORAPREP W/TINT 26 (MISCELLANEOUS) ×3 IMPLANT
CLIP APPLIE 5 13 M/L LIGAMAX5 (MISCELLANEOUS) ×1 IMPLANT
COVER MAYO STAND STRL (DRAPES) IMPLANT
COVER SURGICAL LIGHT HANDLE (MISCELLANEOUS) ×3 IMPLANT
COVER WAND RF STERILE (DRAPES) ×3 IMPLANT
DERMABOND ADHESIVE PROPEN (GAUZE/BANDAGES/DRESSINGS) ×2
DERMABOND ADVANCED (GAUZE/BANDAGES/DRESSINGS) ×2
DERMABOND ADVANCED .7 DNX12 (GAUZE/BANDAGES/DRESSINGS) ×1 IMPLANT
DERMABOND ADVANCED .7 DNX6 (GAUZE/BANDAGES/DRESSINGS) ×1 IMPLANT
DRAIN CHANNEL 19F RND (DRAIN) ×3 IMPLANT
DRAPE C-ARM 42X120 X-RAY (DRAPES) IMPLANT
DRSG TEGADERM 2-3/8X2-3/4 SM (GAUZE/BANDAGES/DRESSINGS) ×3 IMPLANT
ELECT REM PT RETURN 9FT ADLT (ELECTROSURGICAL) ×3
ELECTRODE REM PT RTRN 9FT ADLT (ELECTROSURGICAL) ×1 IMPLANT
ENDOLOOP SUT PDS II  0 18 (SUTURE) ×6
ENDOLOOP SUT PDS II 0 18 (SUTURE) ×2 IMPLANT
EVACUATOR SILICONE 100CC (DRAIN) ×3 IMPLANT
GLOVE SURG SIGNA 7.5 PF LTX (GLOVE) ×3 IMPLANT
GOWN STRL REUS W/ TWL LRG LVL3 (GOWN DISPOSABLE) ×2 IMPLANT
GOWN STRL REUS W/ TWL XL LVL3 (GOWN DISPOSABLE) ×1 IMPLANT
GOWN STRL REUS W/TWL LRG LVL3 (GOWN DISPOSABLE) ×6
GOWN STRL REUS W/TWL XL LVL3 (GOWN DISPOSABLE) ×3
HEMOSTAT SNOW SURGICEL 2X4 (HEMOSTASIS) ×3 IMPLANT
KIT BASIN OR (CUSTOM PROCEDURE TRAY) ×3 IMPLANT
KIT TURNOVER KIT B (KITS) ×3 IMPLANT
NS IRRIG 1000ML POUR BTL (IV SOLUTION) ×3 IMPLANT
PAD ARMBOARD 7.5X6 YLW CONV (MISCELLANEOUS) ×3 IMPLANT
POUCH SPECIMEN RETRIEVAL 10MM (ENDOMECHANICALS) ×3 IMPLANT
SCISSORS LAP 5X35 DISP (ENDOMECHANICALS) ×3 IMPLANT
SET CHOLANGIOGRAPH 5 50 .035 (SET/KITS/TRAYS/PACK) IMPLANT
SET IRRIG TUBING LAPAROSCOPIC (IRRIGATION / IRRIGATOR) ×3 IMPLANT
SET TUBE SMOKE EVAC HIGH FLOW (TUBING) ×3 IMPLANT
SLEEVE ENDOPATH XCEL 5M (ENDOMECHANICALS) ×6 IMPLANT
SPECIMEN JAR SMALL (MISCELLANEOUS) ×3 IMPLANT
SUT ETHILON 3 0 PS 1 (SUTURE) ×3 IMPLANT
SUT MNCRL AB 4-0 PS2 18 (SUTURE) ×3 IMPLANT
TOWEL GREEN STERILE (TOWEL DISPOSABLE) ×3 IMPLANT
TOWEL GREEN STERILE FF (TOWEL DISPOSABLE) ×3 IMPLANT
TRAY LAPAROSCOPIC MC (CUSTOM PROCEDURE TRAY) ×3 IMPLANT
TROCAR XCEL BLUNT TIP 100MML (ENDOMECHANICALS) ×3 IMPLANT
TROCAR XCEL NON-BLD 5MMX100MML (ENDOMECHANICALS) ×3 IMPLANT
WATER STERILE IRR 1000ML POUR (IV SOLUTION) ×3 IMPLANT

## 2020-04-18 NOTE — Progress Notes (Signed)
Patient ID: AKSHAR STARNES, male   DOB: 29-Apr-1951, 69 y.o.   MRN: [id number]   Pre Procedure note for inpatients:   Stephen Wade has been scheduled for Procedure(s): LAPAROSCOPIC CHOLECYSTECTOMY WITH INTRAOPERATIVE CHOLANGIOGRAM (N/A) today. The various methods of treatment have been discussed with the patient. After consideration of the risks, benefits and treatment options the patient has consented to the planned procedure.   The patient has been seen and labs reviewed. There are no changes in the patient's condition to prevent proceeding with the planned procedure today.  Recent labs:  Lab Results  Component Value Date   WBC 5.7 04/18/2020   HGB 12.4 (L) 04/18/2020   HCT 38.8 (L) 04/18/2020   PLT 199 04/18/2020   GLUCOSE 69 (L) 04/18/2020   CHOL 197 04/17/2020   TRIG 128 04/17/2020   HDL 35 (L) 04/17/2020   LDLDIRECT 158.6 01/18/2011   LDLCALC 136 (H) 04/17/2020   ALT 63 (H) 04/18/2020   AST 60 (H) 04/18/2020   NA 145 04/18/2020   K 4.1 04/18/2020   CL 112 (H) 04/18/2020   CREATININE 0.82 04/18/2020   BUN 16 04/18/2020   CO2 25 04/18/2020   TSH 6.555 (H) 12/02/2019   PSA 0.77 09/06/2016   HGBA1C 5.2 05/12/2014    [name], MD 04/18/2020 8:49 AM

## 2020-04-18 NOTE — Progress Notes (Signed)
PROGRESS NOTE    Stephen Wade  TWS:[id number] DOB: 1951/12/06 DOA: 04/17/2020 PCP: Wanda Plump, MD   Brief Narrative:  HPI: Stephen Wade is a 69 y.o. male with medical history significant of severe MS bedbound with chronic indwelling Foley catheter, cholelithiasis, paroxysmal atrial fibrillation not on anticoagulation, and GERD presents with complaints of abdominal pain. History is obtained from the patient with the assistance of his wife.  Last hospitalized with sepsis secondary to group B strep UTI and aspiration pneumonia in December 2020.  During that hospitalization patient was noted to have slurred speech and drooling, but MRI imaging showed no acute changes.  He was discharged to inpatient rehab until going home on 12/31/2019.  Wife makes notes he has had intermittent abdominal pain since that time.  For the last week he has been not eating much of anything and more lethargic.  Pain is located in the epigastric and left upper quadrant region.  Symptoms usually occurred after attempting to eat, but now appear to be constant.  Associated symptoms include nausea, vomiting, increased urine sediment, and cough.  She has been flushing his Foley catheter twice daily with water and vinegar solution.  Patient has also been getting Foley catheter changed out every week.  He had been evaluated by Dr. Magnus Ivan of general surgery as he was seen during that previous hospitalization to have cholelithiasis without signs of cholecystitis.  Wife makes note that they decided not to undergo surgery at that time due to his poor overall functional status.  In regards to patient's overall CODE STATUS records note that patient was previously DNR.  Husband and wife agreed that they would like to DNR status taken away and to make him a full code. Patient has been noted to aspirate, but he does not want to be on a modified diet even though this could put him at increased risks.  He is also followed in the outpatient setting by  palliative care.  ED Course: Upon admission into the emergency department patient was noted to be afebrile with vital signs relatively within normal limits.  Labs significant for calcium 10.4, alkaline phosphatase 197, lipase 68, AST 59, and ALT 67.  Imaging studies significant for signs of pancreatitis, cholelithiasis, and bilateral lower lobe infiltrates right lung worse than left.  Patient had been given 1 L of lactated Ringer's, Rocephin, and azithromycin.  TRH called to admit.  Assessment & Plan:   Principal Problem:   Pancreatitis Active Problems:   URINARY CALCULUS   Spastic gait   Multiple sclerosis (HCC)   Pressure injury of skin   Chronic indwelling Foley catheter   Transaminitis   Dysphagia   Neurogenic bladder   Palliative care patient   Hypercalcemia   Acute biliary pancreatitis: Lipase slightly elevated but CT confirms pancreatitis. Cholelithiasis but no cholecystitis. General surgery on board. Status post laparoscopic cholecystectomy by Dr. Magnus Ivan today. Doing well postoperatively. On clear liquid diet. Appreciate general surgery help.  History of dysphagia and aspiration pneumonia:  Imaging studies concerning for airspace opacities in the bilateral lower lungs right worse than left.  Patient declines being on a modified diet despite of understanding the risk of aspiration. Continue Rocephin and Zithromax.  Chronic indwelling Foley catheter, possible UTI vs. asymptomatic bacteriuria: Acute on chronic.  Patient has chronic indwelling Foley catheter due to neurogenic bladder with prior history of recurrent UTIs.  His wife has noted increased sediment in his urine.  She has been flushing the catheter twice daily with water  and vinegar solution.  He has also been having the catheter changed out weekly. -Follow-up urine culture -Continue twice daily for Foley catheter flushes -Antibiotics as seen above  Hypercalcemia: Acute. Resolved.  Elevated LFTs: Liver enzymes  mildly elevated including alkaline phosphatase 197, AST 59, and ALT 67.  Suspect secondary to gallstone. -Recheck CMP in a.m.  Multiple sclerosis with spastic gait: Patient is significantly debilitated and known to be wheelchair/bedbound at baseline.  Home medications include baclofen 10 mg 3 times daily as needed for muscle spasms and Teriflunomide 14 mg daily. -Continue home medication regimen  Pressure ulcer of the sacrum. poa: Acute on chronic.  Stage II pressure ulcer of the sacrum. -Low-air-loss bed replacement -Wound care consult  Paroxysmal atrial fibrillation: Patient currently in sinus rhythm.CHA2DS2-VASc score = 1. -Continue to monitor. Not on any anticoagulation.  Palliative care patient: On patient's chart notes that he is on hospice, but wife reports that he is under palliative care.  Nephrolithiasis, bladder calculi: Incidental finding seen on CT that are nonobstructing.  DVT prophylaxis: Lovenox   Code Status: Full Code  Family Communication:  None present at bedside. Patient is from: Home Disposition Plan: Likely home in next 1 to 2 days Barriers to discharge: Just had the surgery today  Status is: Inpatient  Remains inpatient appropriate because:IV treatments appropriate due to intensity of illness or inability to take PO   Dispo: The patient is from: Home              Anticipated d/c is to: Home              Anticipated d/c date is: 2 days              Patient currently is not medically stable to d/c.         Estimated body mass index is 26.46 kg/m as calculated from the following:   Height as of this encounter: 6' (1.829 m).   Weight as of this encounter: 88.5 kg.      Nutritional status:               Consultants:   General surgery  Procedures:   Laparoscopic cholecystectomy 04/18/2020  Antimicrobials:  Anti-infectives (From admission, onward)   Start     Dose/Rate Route Frequency Ordered Stop   04/18/20 1000   cefTRIAXone (ROCEPHIN) 2 g in sodium chloride 0.9 % 100 mL IVPB     2 g 200 mL/hr over 30 Minutes Intravenous Every 24 hours 04/17/20 1437     04/18/20 1000  azithromycin (ZITHROMAX) 500 mg in sodium chloride 0.9 % 250 mL IVPB     500 mg 250 mL/hr over 60 Minutes Intravenous Every 24 hours 04/17/20 1437     04/17/20 1145  cefTRIAXone (ROCEPHIN) 1 g in sodium chloride 0.9 % 100 mL IVPB     1 g 200 mL/hr over 30 Minutes Intravenous  Once 04/17/20 1141 04/17/20 1415   04/17/20 1145  azithromycin (ZITHROMAX) 500 mg in sodium chloride 0.9 % 250 mL IVPB     500 mg 250 mL/hr over 60 Minutes Intravenous  Once 04/17/20 1141 04/17/20 1902         Subjective: Patient seen and examined postoperatively. He did not have any complaint.  Objective: Vitals:   04/18/20 1044 04/18/20 1045 04/18/20 1100 04/18/20 1115  BP: 128/82  126/63 122/72  Pulse: 93 96 92 90  Resp: 18 (!) 8 18 18   Temp: 97.6 F (36.4 C)   (!) 97.4 F (  36.3 C)  TempSrc:      SpO2: 95% 95% 95% 95%  Weight:      Height:        Intake/Output Summary (Last 24 hours) at 04/18/2020 1448 Last data filed at 04/18/2020 1115 Gross per 24 hour  Intake 3351.83 ml  Output 2380 ml  Net 971.83 ml   Filed Weights   04/17/20 0726  Weight: 88.5 kg    Examination:  General exam: Appears calm and comfortable but slow speech, at his baseline Respiratory system: Clear to auscultation. Respiratory effort normal. Cardiovascular system: S1 & S2 heard, RRR. No JVD, murmurs, rubs, gallops or clicks. No pedal edema. Gastrointestinal system: Abdomen is nondistended, soft and nontender. No organomegaly or masses felt. Normal bowel sounds heard. Intra-abdominal drain with biliary drainage. Central nervous system: Alert and oriented. No focal neurological deficits. Extremities: Symmetric 5 x 5 power. Skin: No rashes, lesions or ulcers Psychiatry: Judgement and insight appear normal. Mood & affect appropriate.    Data Reviewed: I have  personally reviewed following labs and imaging studies  CBC: Recent Labs  Lab 04/17/20 0733 04/18/20 0514  WBC 5.1 5.7  HGB 13.4 12.4*  HCT 41.7 38.8*  MCV 92.1 90.7  PLT 226 199   Basic Metabolic Panel: Recent Labs  Lab 04/17/20 0733 04/18/20 0514  NA 145 145  K 4.1 4.1  CL 107 112*  CO2 27 25  GLUCOSE 74 69*  BUN 23 16  CREATININE 0.82 0.82  CALCIUM 10.4* 9.9   GFR: Estimated Creatinine Clearance: 95.9 mL/min (by C-G formula based on SCr of 0.82 mg/dL). Liver Function Tests: Recent Labs  Lab 04/17/20 0733 04/18/20 0514  AST 59* 60*  ALT 67* 63*  ALKPHOS 197* 173*  BILITOT 0.8 0.7  PROT 7.1 6.2*  ALBUMIN 3.4* 3.1*   Recent Labs  Lab 04/17/20 0733 04/18/20 0514  LIPASE 68* 49   No results for input(s): AMMONIA in the last 168 hours. Coagulation Profile: No results for input(s): INR, PROTIME in the last 168 hours. Cardiac Enzymes: No results for input(s): CKTOTAL, CKMB, CKMBINDEX, TROPONINI in the last 168 hours. BNP (last 3 results) No results for input(s): PROBNP in the last 8760 hours. HbA1C: No results for input(s): HGBA1C in the last 72 hours. CBG: No results for input(s): GLUCAP in the last 168 hours. Lipid Profile: Recent Labs    04/17/20 2257  CHOL 197  HDL 35*  LDLCALC 136*  TRIG 128  CHOLHDL 5.6   Thyroid Function Tests: No results for input(s): TSH, T4TOTAL, FREET4, T3FREE, THYROIDAB in the last 72 hours. Anemia Panel: No results for input(s): VITAMINB12, FOLATE, FERRITIN, TIBC, IRON, RETICCTPCT in the last 72 hours. Sepsis Labs: No results for input(s): PROCALCITON, LATICACIDVEN in the last 168 hours.  Recent Results (from the past 240 hour(s))  Urine Culture     Status: Abnormal (Preliminary result)   Collection Time: 04/17/20 11:42 AM   Specimen: Urine, Catheterized  Result Value Ref Range Status   Specimen Description URINE, CATHETERIZED  Final   Special Requests   Final    NONE Performed at [facility], [address], [place], [place] [address]    Culture >=100,000 COLONIES/mL STAPHYLOCOCCUS AUREUS (A)  Final   Report Status PENDING  Incomplete  Respiratory Panel by RT PCR (Flu A&B, Covid) - Nasopharyngeal Swab     Status: None   Collection Time: 04/17/20  1:56 PM   Specimen: Nasopharyngeal Swab  Result Value Ref Range Status   SARS  Coronavirus 2 by RT PCR NEGATIVE NEGATIVE Final    Comment: (NOTE) SARS-CoV-2 target nucleic acids are NOT DETECTED. The SARS-CoV-2 RNA is generally detectable in upper respiratoy specimens during the acute phase of infection. The lowest concentration of SARS-CoV-2 viral copies this assay can detect is 131 copies/mL. A negative result does not preclude SARS-Cov-2 infection and should not be used as the sole basis for treatment or other patient management decisions. A negative result may occur with  improper specimen collection/handling, submission of specimen other than nasopharyngeal swab, presence of viral mutation(s) within the areas targeted by this assay, and inadequate number of viral copies (<131 copies/mL). A negative result must be combined with clinical observations, patient history, and epidemiological information. The expected result is Negative. Fact Sheet for Patients:  https://www.moore.com/ Fact Sheet for Healthcare Providers:  https://www.young.biz/ This test is not yet ap proved or cleared by the [place] FDA and  has been authorized for detection and/or diagnosis of SARS-CoV-2 by FDA under an Emergency Use Authorization (EUA). This EUA will remain  in effect (meaning this test can be used) for the duration of the COVID-19 declaration under Section 564(b)(1) of the Act, 21 U.S.C. section 360bbb-3(b)(1), unless the authorization is terminated or revoked sooner.    Influenza A by PCR NEGATIVE NEGATIVE Final   Influenza B by PCR NEGATIVE NEGATIVE Final    Comment: (NOTE) The Xpert Xpress  SARS-CoV-2/FLU/RSV assay is intended as an aid in  the diagnosis of influenza from Nasopharyngeal swab specimens and  should not be used as a sole basis for treatment. Nasal washings and  aspirates are unacceptable for Xpert Xpress SARS-CoV-2/FLU/RSV  testing. Fact Sheet for Patients: https://www.moore.com/ Fact Sheet for Healthcare Providers: https://www.young.biz/ This test is not yet approved or cleared by the [place] FDA and  has been authorized for detection and/or diagnosis of SARS-CoV-2 by  FDA under an Emergency Use Authorization (EUA). This EUA will remain  in effect (meaning this test can be used) for the duration of the  Covid-19 declaration under Section 564(b)(1) of the Act, 21  U.S.C. section 360bbb-3(b)(1), unless the authorization is  terminated or revoked. Performed at [facility], [address], [place], [place] [address]   Surgical pcr screen     Status: Abnormal   Collection Time: 04/17/20 11:11 PM   Specimen: Nasal Mucosa; Nasal Swab  Result Value Ref Range Status   MRSA, PCR NEGATIVE NEGATIVE Final   Staphylococcus aureus POSITIVE (A) NEGATIVE Final    Comment: (NOTE) The Xpert SA Assay (FDA approved for NASAL specimens in patients 88 years of age and older), is one component of a comprehensive surveillance program. It is not intended to diagnose infection nor to guide or monitor treatment. Performed at [facility], [address] [place], [place] [address]       Radiology Studies: CT ABDOMEN PELVIS W CONTRAST  Result Date: 04/17/2020 CLINICAL DATA:  Intermittent upper abdominal pain for 1 month, epigastric pain for 3 weeks, history of gallstones EXAM: CT ABDOMEN AND PELVIS WITH CONTRAST TECHNIQUE: Multidetector CT imaging of the abdomen and pelvis was performed using the standard protocol following bolus administration of intravenous contrast. CONTRAST:  <See Chart> OMNIPAQUE IOHEXOL 350 MG/ML  SOLN, OMNIPAQUE IOHEXOL 300 MG/ML SOLN COMPARISON:  04/20/2013 FINDINGS: Lower chest: Scattered areas of airspace disease are seen at the lung bases, right greater than left. Findings could reflect infection or aspiration. Hepatobiliary: Gallbladder is decompressed, with nonspecific gallbladder wall thickening. Multiple calcified gallstones  are identified. The liver is grossly unremarkable without focal abnormality. Pancreas: Pancreatic parenchyma enhances normally. There is mild fat stranding surrounding the pancreatic tail which could reflect acute uncomplicated pancreatitis. No fluid collection, pseudocyst, or abscess. Please correlate with laboratory analysis. Spleen: Normal in size without focal abnormality. Adrenals/Urinary Tract: There are bilateral nonobstructing renal calculi, measuring up to 5 mm in the upper pole left kidney and 7 mm lower pole right kidney. Scattered bilateral renal cortical cysts are noted. The adrenals are normal. No obstructive uropathy. The bladder is decompressed with a Foley catheter, limiting evaluation. There are 3 distinct large bladder calculi, largest measuring 3.1 cm. Stomach/Bowel: No bowel obstruction or ileus. The appendix is surgically absent. No bowel wall thickening or inflammatory change. Moderate retained stool. Vascular/Lymphatic: Aortic atherosclerosis. Approximately 50% stenosis at the origin of the right common iliac artery. Please correlate with any symptoms of right lower extremity claudication. No pathologic adenopathy. Reproductive: Prostate is unremarkable. Other: No abdominal wall hernia or abnormality. No abdominopelvic ascites. Musculoskeletal: No acute or destructive bony lesions. Reconstructed images demonstrate no additional findings. IMPRESSION: 1. Mild fat stranding surrounding the pancreatic tail could reflect acute uncomplicated pancreatitis. Please correlate with laboratory analysis. 2. Cholelithiasis, with nonspecific gallbladder wall  thickening given incomplete distension. No pericholecystic inflammatory changes to suggest cholecystitis. 3. Bilateral nonobstructing renal calculi. 4. Bladder calculi. 5. Approximately 50% stenosis at the origin of the right common iliac artery. Please correlate with any symptoms of right lower extremity claudication. 6. Scattered areas of airspace disease at the lung bases, right greater than left, could reflect infection or aspiration. 7. Aortic Atherosclerosis (ICD10-I70.0). Electronically Signed   By: Sharlet Salina M.D.   On: 04/17/2020 10:05   DG Chest Port 1 View  Result Date: 04/17/2020 CLINICAL DATA:  Cough EXAM: PORTABLE CHEST 1 VIEW COMPARISON:  12/13/2019 FINDINGS: Low volume chest with interstitial coarsening at the bases that was also seen previously but correlates with airspace disease by CT from earlier today. Normal heart size and mediastinal contours. No visible effusion or pneumothorax. Artifact from EKG leads. IMPRESSION: Low volume chest with subtle infiltrates at the bases. This correlates with atelectasis and airspace disease by abdominal CT earlier today. A similar pattern was seen December 2020, question recurrent aspiration. Electronically Signed   By: Marnee Spring M.D.   On: 04/17/2020 11:24   US Abdomen Limited RUQ  Result Date: 04/17/2020 CLINICAL DATA:  Upper abdominal pain, cholelithiasis EXAM: ULTRASOUND ABDOMEN LIMITED RIGHT UPPER QUADRANT COMPARISON:  04/17/2020 FINDINGS: Gallbladder: Multiple shadowing gallstones are identified. Gallbladder is incompletely distended which limits evaluation. No evidence of gallbladder wall thickening or pericholecystic fluid. Common bile duct: Diameter: 4 mm Liver: No focal lesion identified. Within normal limits in parenchymal echogenicity. Portal vein is patent on color Doppler imaging with normal direction of blood flow towards the liver. Other: None. IMPRESSION: 1. Cholelithiasis without cholecystitis. Electronically Signed   By:  Sharlet Salina M.D.   On: 04/17/2020 11:07    Scheduled Meds: . Chlorhexidine Gluconate Cloth  6 each Topical Daily  . enoxaparin (LOVENOX) injection  40 mg Subcutaneous Q24H  . Gerhardt's butt cream   Topical TID  . mupirocin ointment  1 application Nasal BID  . sodium chloride flush  3 mL Intravenous Q12H   Continuous Infusions: . azithromycin 500 mg (04/18/20 1219)  . cefTRIAXone (ROCEPHIN)  IV 2 g (04/18/20 1409)  . lactated ringers 125 mL/hr at 04/18/20 0443  . lactated ringers Stopped (04/18/20 1046)     LOS: 1 day  Time spent: 35 minutes   Darliss Cheney, MD [facility]  04/18/2020, 2:48 PM   To contact the attending provider between 7A-7P or the covering provider during after hours 7P-7A, please log into the web site [email or web address].

## 2020-04-18 NOTE — Op Note (Signed)
Stephen Wade 04/18/2020   Pre-op Diagnosis: cholecystitis with cholethiasis     Post-op Diagnosis: same  Procedure(s): LAPAROSCOPIC CHOLECYSTECTOMY  Surgeon(s): Coralie Keens, MD  Anesthesia: General  Staff:  Circulator: Illene Labrador, RN Scrub Person: Kevan Rosebush; Teschner, Burman Foster, Immunologist: Philomena Doheny, RN  Estimated Blood Loss: less than 100 mL               Specimens: sent to path  Indications: This is a 69 year old gentleman with severe MS and known gallstones who presents with cholecystitis and gallstone pancreatitis.  The decision was made to proceed to the operating room for cholecystectomy  Findings: The patient was found of a very inflamed gallbladder with gallstones and it partial intrahepatic gallbladder.  I could not pass a cholangiocatheter down the cystic duct well enough to perform a cholangiogram.  Procedure: The patient was brought to the operating room identifies correct patient.  He is placed upon the operating room table and general anesthesia was induced.  His abdomen was then prepped and draped in usual sterile fashion.  I made a small incision just below umbilicus with a scalpel.  I carried this down to the fascia which was then opened the scalpel.  Hemostat was then used to pass to the peritoneal cavity under direct vision.  A 0 Vicryl pursestring suture was placed around the fascial opening.  The [facility] port was placed through the opening and insufflation of the abdomen was begun.  I next placed a 5 mm trocar in the patient epigastrium and 2 more in the right upper quadrant all under direct vision.  The gallbladder is found to be acutely inflamed and covered with omentum.  Using the cautery I was able to free the omentum up off the gallbladder.  The top portion of the gallbladder was intrahepatic.  I was finally able to grasp it and tract above the liver bed and then dissect out the base the gallbladder.  The cystic duct was  very woody.  I was able to dissected out circumferentially as well as the cystic artery.  I clipped the artery proximally distally and transected it.  I was then able to make an opening in the cystic duct with the scissors.  I then placed a cholangiocatheter through the right upper quadrant under direct vision.  After multiple attempts, I was unable to thread the catheter far enough down the cystic duct to perform a cholangiogram.  At this point decision was made to forego cholangiogram.  The cystic duct itself again was woody it would not hold clips well.  I transected it next to the gallbladder and then closed the cystic stump with 2 separate Endoloop PDS sutures.  The gallbladder was then dissected free from liver bed with electrocautery.  I did clip 1 bleeding vessel in the gallbladder fossa with cautery.  The gallbladder was then finally completely removed including the intrahepatic component near the dome of the liver.  I then placed the gallbladder in an Endosac and removed that the incision at the umbilicus.  At this point, hemostasis appeared to be achieved.  I did place a piece of surgical snow in the gallbladder fossa.  I then decided to place a 56 [place] Blake drain into the gallbladder fossa.  I was able to place the drain through the epigastric trocar and pulled out of the most lateral trocar site.  The drain was cut an appropriate length and placed underneath the liver bed.  I then sutured  in place with nylon suture.  We then irrigated the abdomen with saline.  Again, hemostasis appeared to be achieved.  All ports were removed under direct vision the abdomen was deflated.  The 0 Vicryl at the umbilicus was tied in place closing the fascial defect.  All incisions were then anesthetized with Marcaine and closed with 4-0 Monocryl sutures.  Dermabond was then applied.  The patient tolerated the procedure well.  All the counts were correct at the end of the procedure.  Patient was then extubated in the  operating room and taken in a stable condition to the recovery room.          Stephen Wade   Date: 04/18/2020  Time: 10:34 AM

## 2020-04-18 NOTE — Consult Note (Signed)
WOC Nurse Consult Note: Reason for Consult: MASD to coccygeal area.  Some evidence of scattered, dried blood. Family states that  Areas have been responding to collagen and silver.  No evidence of open wounds at this time. Mattress replacement in place. Wound type: Moisture plus pressure Pressure Injury POA: Yes/No/NA Measurement: 8cm x 12cm area of blanching erythema. Light purple hue is not new, according to family. Three places where a scant amount of dried blood is note, but when cleansed, there is no wound noted. Wound bed: N/A Drainage (amount, consistency, odor) N/A Periwound: as noted above, purple discoloration  Dressing procedure/placement/frequency: Patient will be turned from side to side.  Silicone foam dressing is in place.  I will add application of a 1:1:1 compounded lotrimin/hydrocortisone/zinc oxide cream to the coccyx three times daily.  WOC nursing team will not follow, but will remain available to this patient, the nursing and medical teams.  Please re-consult if needed. Thanks, Ladona Mow, MSN, RN, GNP, [place]  Pager# [phone]

## 2020-04-18 NOTE — Transfer of Care (Signed)
Immediate Anesthesia Transfer of Care Note  Patient: Stephen Wade  Procedure(s) Performed: LAPAROSCOPIC CHOLECYSTECTOMY WITH attempted INTRAOPERATIVE CHOLANGIOGRAM (N/A )  Patient Location: PACU  Anesthesia Type:General  Level of Consciousness: drowsy  Airway & Oxygen Therapy: Patient Spontanous Breathing  Post-op Assessment: Report given to RN and Post -op Vital signs reviewed and stable  Post vital signs: Reviewed and stable  Last Vitals:  Vitals Value Taken Time  BP 128/82 04/18/20 1044  Temp 36.4 C 04/18/20 1044  Pulse 96 04/18/20 1048  Resp 17 04/18/20 1048  SpO2 95 % 04/18/20 1048  Vitals shown include unvalidated device data.  Last Pain:  Vitals:   04/18/20 0800  TempSrc:   PainSc: 0-No pain         Complications: No apparent anesthesia complications

## 2020-04-18 NOTE — Anesthesia Procedure Notes (Signed)
Procedure Name: Intubation Performed by: Ezekiel Ina, CRNA Pre-anesthesia Checklist: Patient identified, Emergency Drugs available, Suction available and Patient being monitored Patient Re-evaluated:Patient Re-evaluated prior to induction Oxygen Delivery Method: Circle System Utilized Preoxygenation: Pre-oxygenation with 100% oxygen Induction Type: IV induction, Rapid sequence and Cricoid Pressure applied Laryngoscope Size: Miller and 2 Grade View: Grade I Tube type: Oral Tube size: 7.5 mm Number of attempts: 1 Airway Equipment and Method: Stylet and Oral airway Placement Confirmation: ETT inserted through vocal cords under direct vision,  positive ETCO2 and breath sounds checked- equal and bilateral Secured at: 24 cm Tube secured with: Tape Dental Injury: Teeth and Oropharynx as per pre-operative assessment

## 2020-04-18 NOTE — Consult Note (Signed)
WOC Nurse Consult Note: Reason for Consult: MASD to sacrum Wound type: Moisture  Patient is currently in surgery.  Will attempt to see later today.  Thank you for this consult. Ladona Mow, MSN, RN, GNP, [place]  Pager# [phone]

## 2020-04-18 NOTE — Anesthesia Preprocedure Evaluation (Addendum)
Anesthesia Evaluation  Patient identified by MRN, date of birth, ID band Patient awake    Reviewed: Allergy & Precautions, NPO status , Patient's Chart, lab work & pertinent test results  History of Anesthesia Complications Negative for: history of anesthetic complications  Airway Mallampati: II  TM Distance: >3 FB Neck ROM: Full    Dental  (+) Dental Advisory Given, Teeth Intact   Pulmonary former smoker,    Pulmonary exam normal        Cardiovascular + dysrhythmias Atrial Fibrillation  Rhythm:Irregular Rate:Normal   '20 TTE - EF 55 to 60%. Trivial TR    Neuro/Psych PSYCHIATRIC DISORDERS Depression  Neuromuscular disease (MS with spasticity, minimally ambulatory)    GI/Hepatic Neg liver ROS, GERD  Medicated and Controlled,  Endo/Other  negative endocrine ROS  Renal/GU negative Renal ROS Bladder dysfunction      Musculoskeletal negative musculoskeletal ROS (+)   Abdominal   Peds  Hematology negative hematology ROS (+)   Anesthesia Other Findings   Reproductive/Obstetrics                            Anesthesia Physical Anesthesia Plan  ASA: III  Anesthesia Plan: General   Post-op Pain Management:    Induction: Intravenous, Rapid sequence and Cricoid pressure planned  PONV Risk Score and Plan: 3 and Treatment may vary due to age or medical condition, Ondansetron and Dexamethasone  Airway Management Planned: Oral ETT  Additional Equipment: None  Intra-op Plan:   Post-operative Plan: Extubation in OR  Informed Consent: I have reviewed the patients History and Physical, chart, labs and discussed the procedure including the risks, benefits and alternatives for the proposed anesthesia with the patient or authorized representative who has indicated his/her understanding and acceptance.     Dental advisory given  Plan Discussed with: CRNA and Anesthesiologist  Anesthesia Plan  Comments: (Patient vomited in preop. Will plan for RSI )       Anesthesia Quick Evaluation

## 2020-04-19 DIAGNOSIS — K851 Biliary acute pancreatitis without necrosis or infection: Secondary | ICD-10-CM | POA: Diagnosis not present

## 2020-04-19 LAB — COMPREHENSIVE METABOLIC PANEL
ALT: 59 U/L — ABNORMAL HIGH (ref 0–44)
AST: 60 U/L — ABNORMAL HIGH (ref 15–41)
Albumin: 3.2 g/dL — ABNORMAL LOW (ref 3.5–5.0)
Alkaline Phosphatase: 158 U/L — ABNORMAL HIGH (ref 38–126)
Anion gap: 13 (ref 5–15)
BUN: 19 mg/dL (ref 8–23)
CO2: 25 mmol/L (ref 22–32)
Calcium: 10.1 mg/dL (ref 8.9–10.3)
Chloride: 108 mmol/L (ref 98–111)
Creatinine, Ser: 0.96 mg/dL (ref 0.61–1.24)
GFR calc Af Amer: >60 mL/min (ref >60)
GFR calc non Af Amer: >60 mL/min (ref >60)
Glucose, Bld: 100 mg/dL — ABNORMAL HIGH (ref 70–99)
Potassium: 4.5 mmol/L (ref 3.5–5.1)
Sodium: 146 mmol/L — ABNORMAL HIGH (ref 135–145)
Total Bilirubin: 0.6 mg/dL (ref 0.3–1.2)
Total Protein: 6.6 g/dL (ref 6.5–8.1)

## 2020-04-19 LAB — CBC
HCT: 39.6 % (ref 39.0–52.0)
Hemoglobin: 12.8 g/dL — ABNORMAL LOW (ref 13.0–17.0)
MCH: 29.5 pg (ref 26.0–34.0)
MCHC: 32.3 g/dL (ref 30.0–36.0)
MCV: 91.2 fL (ref 80.0–100.0)
Platelets: 227 10*3/uL (ref 150–400)
RBC: 4.34 MIL/uL (ref 4.22–5.81)
RDW: 16 % — ABNORMAL HIGH (ref 11.5–15.5)
WBC: 11.6 10*3/uL — ABNORMAL HIGH (ref 4.0–10.5)
nRBC: 0 % (ref 0.0–0.2)

## 2020-04-19 LAB — SURGICAL PATHOLOGY

## 2020-04-19 MED ORDER — SENNOSIDES-DOCUSATE SODIUM 8.6-50 MG PO TABS
1.0000 | ORAL_TABLET | Freq: Two times a day (BID) | ORAL | Status: DC
  Administered 2020-04-19 – 2020-04-26 (×6): 1 via ORAL
  Filled 2020-04-19 (×9): qty 1

## 2020-04-19 MED ORDER — SODIUM CHLORIDE 0.9 % IV SOLN
3.0000 g | Freq: Four times a day (QID) | INTRAVENOUS | Status: AC
  Administered 2020-04-20 – 2020-04-23 (×15): 3 g via INTRAVENOUS
  Filled 2020-04-19 (×2): qty 3
  Filled 2020-04-19 (×2): qty 8
  Filled 2020-04-19 (×4): qty 3
  Filled 2020-04-19: qty 8
  Filled 2020-04-19 (×6): qty 3

## 2020-04-19 MED ORDER — SODIUM CHLORIDE 0.9 % IV SOLN
3.0000 g | Freq: Four times a day (QID) | INTRAVENOUS | Status: DC
  Filled 2020-04-19 (×2): qty 8

## 2020-04-19 NOTE — Progress Notes (Signed)
1 Day Post-Op   Subjective/Chief Complaint: Nausea last night and this morning Had emesis last night   Objective: Vital signs in last 24 hours: Temp:  [97.4 F (36.3 C)-97.6 F (36.4 C)] 97.5 F (36.4 C) (04/28 0433) Pulse Rate:  [90-108] 108 (04/28 0433) Resp:  [8-20] 20 (04/28 0433) BP: (116-128)/(63-88) 122/75 (04/28 0433) SpO2:  [95 %-96 %] 96 % (04/28 0433) Last BM Date: 04/17/20  Intake/Output from previous day: 04/27 0701 - 04/28 0700 In: 2414.4 [I.V.:1814.4; IV Piggyback:600] Out: 2410 [Urine:1700; Emesis/NG output:300; Drains:110; Blood:50] Intake/Output this shift: No intake/output data recorded.  Exam: Awake and alert Looks comfortable Abdomen soft, drain serosang  Lab Results:  Recent Labs    04/18/20 0514 04/19/20 0434  WBC 5.7 11.6*  HGB 12.4* 12.8*  HCT 38.8* 39.6  PLT 199 227   BMET Recent Labs    04/18/20 0514 04/19/20 0434  NA 145 146*  K 4.1 4.5  CL 112* 108  CO2 25 25  GLUCOSE 69* 100*  BUN 16 19  CREATININE 0.82 0.96  CALCIUM 9.9 10.1   PT/INR No results for input(s): LABPROT, INR in the last 72 hours. ABG No results for input(s): PHART, HCO3 in the last 72 hours.  Invalid input(s): PCO2, PO2  Studies/Results: CT ABDOMEN PELVIS W CONTRAST  Result Date: 04/17/2020 CLINICAL DATA:  Intermittent upper abdominal pain for 1 month, epigastric pain for 3 weeks, history of gallstones EXAM: CT ABDOMEN AND PELVIS WITH CONTRAST TECHNIQUE: Multidetector CT imaging of the abdomen and pelvis was performed using the standard protocol following bolus administration of intravenous contrast. CONTRAST:  <See Chart> OMNIPAQUE IOHEXOL 350 MG/ML SOLN, OMNIPAQUE IOHEXOL 300 MG/ML SOLN COMPARISON:  04/20/2013 FINDINGS: Lower chest: Scattered areas of airspace disease are seen at the lung bases, right greater than left. Findings could reflect infection or aspiration. Hepatobiliary: Gallbladder is decompressed, with nonspecific gallbladder wall  thickening. Multiple calcified gallstones are identified. The liver is grossly unremarkable without focal abnormality. Pancreas: Pancreatic parenchyma enhances normally. There is mild fat stranding surrounding the pancreatic tail which could reflect acute uncomplicated pancreatitis. No fluid collection, pseudocyst, or abscess. Please correlate with laboratory analysis. Spleen: Normal in size without focal abnormality. Adrenals/Urinary Tract: There are bilateral nonobstructing renal calculi, measuring up to 5 mm in the upper pole left kidney and 7 mm lower pole right kidney. Scattered bilateral renal cortical cysts are noted. The adrenals are normal. No obstructive uropathy. The bladder is decompressed with a Foley catheter, limiting evaluation. There are 3 distinct large bladder calculi, largest measuring 3.1 cm. Stomach/Bowel: No bowel obstruction or ileus. The appendix is surgically absent. No bowel wall thickening or inflammatory change. Moderate retained stool. Vascular/Lymphatic: Aortic atherosclerosis. Approximately 50% stenosis at the origin of the right common iliac artery. Please correlate with any symptoms of right lower extremity claudication. No pathologic adenopathy. Reproductive: Prostate is unremarkable. Other: No abdominal wall hernia or abnormality. No abdominopelvic ascites. Musculoskeletal: No acute or destructive bony lesions. Reconstructed images demonstrate no additional findings. IMPRESSION: 1. Mild fat stranding surrounding the pancreatic tail could reflect acute uncomplicated pancreatitis. Please correlate with laboratory analysis. 2. Cholelithiasis, with nonspecific gallbladder wall thickening given incomplete distension. No pericholecystic inflammatory changes to suggest cholecystitis. 3. Bilateral nonobstructing renal calculi. 4. Bladder calculi. 5. Approximately 50% stenosis at the origin of the right common iliac artery. Please correlate with any symptoms of right lower extremity  claudication. 6. Scattered areas of airspace disease at the lung bases, right greater than left, could reflect infection or aspiration.  7. Aortic Atherosclerosis (ICD10-I70.0). Electronically Signed   By: Randa Ngo M.D.   On: 04/17/2020 10:05   DG Chest Port 1 View  Result Date: 04/17/2020 CLINICAL DATA:  Cough EXAM: PORTABLE CHEST 1 VIEW COMPARISON:  12/13/2019 FINDINGS: Low volume chest with interstitial coarsening at the bases that was also seen previously but correlates with airspace disease by CT from earlier today. Normal heart size and mediastinal contours. No visible effusion or pneumothorax. Artifact from EKG leads. IMPRESSION: Low volume chest with subtle infiltrates at the bases. This correlates with atelectasis and airspace disease by abdominal CT earlier today. A similar pattern was seen December 2020, question recurrent aspiration. Electronically Signed   By: Monte Fantasia M.D.   On: 04/17/2020 11:24   US Abdomen Limited RUQ  Result Date: 04/17/2020 CLINICAL DATA:  Upper abdominal pain, cholelithiasis EXAM: ULTRASOUND ABDOMEN LIMITED RIGHT UPPER QUADRANT COMPARISON:  04/17/2020 FINDINGS: Gallbladder: Multiple shadowing gallstones are identified. Gallbladder is incompletely distended which limits evaluation. No evidence of gallbladder wall thickening or pericholecystic fluid. Common bile duct: Diameter: 4 mm Liver: No focal lesion identified. Within normal limits in parenchymal echogenicity. Portal vein is patent on color Doppler imaging with normal direction of blood flow towards the liver. Other: None. IMPRESSION: 1. Cholelithiasis without cholecystitis. Electronically Signed   By: Randa Ngo M.D.   On: 04/17/2020 11:07    Anti-infectives: Anti-infectives (From admission, onward)   Start     Dose/Rate Route Frequency Ordered Stop   04/18/20 1000  cefTRIAXone (ROCEPHIN) 2 g in sodium chloride 0.9 % 100 mL IVPB     2 g 200 mL/hr over 30 Minutes Intravenous Every 24 hours  04/17/20 1437     04/18/20 1000  azithromycin (ZITHROMAX) 500 mg in sodium chloride 0.9 % 250 mL IVPB     500 mg 250 mL/hr over 60 Minutes Intravenous Every 24 hours 04/17/20 1437     04/17/20 1145  cefTRIAXone (ROCEPHIN) 1 g in sodium chloride 0.9 % 100 mL IVPB     1 g 200 mL/hr over 30 Minutes Intravenous  Once 04/17/20 1141 04/17/20 1415   04/17/20 1145  azithromycin (ZITHROMAX) 500 mg in sodium chloride 0.9 % 250 mL IVPB     500 mg 250 mL/hr over 60 Minutes Intravenous  Once 04/17/20 1141 04/17/20 1902      Assessment/Plan: s/p Procedure(s): LAPAROSCOPIC CHOLECYSTECTOMY WITH attempted INTRAOPERATIVE CHOLANGIOGRAM (N/A)  Patient s/p lap chole for cholecystitis and gallstones Severe MS  Will go slow with po for risk of aspiration Leave drain for now.  Currently no evidence of bile leak. Repeat labs in the morning. He may need several days here to recover post op   LOS: 2 days    Coralie Keens MD 04/19/2020

## 2020-04-19 NOTE — Progress Notes (Signed)
Progress Note    Stephen Wade  OQH:[id number] DOB: Dec 14, 1951  DOA: 04/17/2020 PCP: Wanda Plump, MD    Brief Narrative:     Medical records reviewed and are as summarized below:  Stephen Wade is an 69 y.o. male with a past medical history that includes severe MS bedbound, dysphagia, chronic indwelling Foley catheter, cholelithiasis, paroxysmal atrial fibrillation not on anticoagulation, GERD admitted April 26 with acute biliary pancreatitis status post laparoscopic cholecystectomy on April 27.  Assessment/Plan:   Principal Problem:   Pancreatitis Active Problems:   Multiple sclerosis (HCC)   Transaminitis   Dysphagia   URINARY CALCULUS   Spastic gait   Pressure injury of skin   Chronic indwelling Foley catheter   Hypercalcemia   Neurogenic bladder   Palliative care patient   #1.  Acute biliary pancreatitis.  Status post laparoscopic cholecystectomy with attempted intraoperative cholangiogram.  Patient continues with nausea and one episode of emesis during the night.  Evaluated by general surgery who opine patient will likely need several days here to recover postop and recommend advancing slowly with p.o.'s given his high risk for aspiration.  LFTs trending down slowly.  Of note total bilirubin within the limits of normal -Supportive therapy -Further management per general surgery  #2.  History of dysphagia and aspiration pneumonia.  Imaging concerning for airspace opacities in bilateral lower lungs.  Patient aware of risks and declines a modified diet.  Appetite poor.  Continuing on clears for now.  Currently he is afebrile hemodynamically stable and nontoxic-appearing -Continue Rocephin and Zithromax -Monitor  #3.  Chronic indwelling Foley catheter, possible UTI versus asymptomatic bacteriuria.  Acute on chronic.  Urine culture with staph aureus.  Of note patient has chronic indwelling Foley catheter due to neurogenic bladder with history of recurrent UTIs.  Currently  his wife flushes catheter twice daily with water and vinegar and catheter is changed out weekly. -Continue home cath care -Continue antibiotics as noted above  #4.  Hypercalcemia.  Acute.  Resolved  #5.  Elevated LFTs.  See #1.  Likely related to #1 -Monitor  #6.  Multiple sclerosis with spastic gait.  Patient mostly wheelchair/bedbound at baseline.  He does have frequent spasms of lower extremities.  Home medications include baclofen and Teriflunonmide -Continue home meds  #7.  Pressure ulcer of sacrum.  Present on admission.  Acute on chronic.  Evaluated by [facility].  Stage II. -Dressing changes per [facility] recommendations -Low air loss bed replacement  #8.  Paroxysmal atrial fibrillation.  Currently in sinus rhythm.  CHA2DS2-VASc score 1.  Not on anticoagulation -Monitor  #10.  Nephrolithiasis/bladder calculi.  Incidental finding seen on CT.  Nonobstructive.   Family Communication/Anticipated D/C date and plan/Code Status   DVT prophylaxis: Lovenox ordered. Code Status: Full Code.  Family Communication: patient with questions answered Disposition Plan: Status is: Inpatient  Remains inpatient appropriate because:Inpatient level of care appropriate due to severity of illness   Dispo: The patient is from: Home              Anticipated d/c is to: Home              Anticipated d/c date is: 3 days              Patient currently is not medically stable to d/c.          Medical Consultants:    Magnus Ivan general surgery   Anti-Infectives:    Rocephin  azithromycin  Subjective:  Reports emesis "all night long". Nursing reports one episode of bile colored emesis during night.  Continues with intermittent nausea denies pain  Objective:    Vitals:   04/18/20 1726 04/18/20 2027 04/19/20 0433 04/19/20 1010  BP: 116/70 124/88 122/75 121/74  Pulse: 91 98 (!) 108 84  Resp: 16 20 20 16   Temp: (!) 97.4 F (36.3 C) (!) 97.5 F (36.4 C) (!) 97.5 F (36.4 C) (!) 97.4 F  (36.3 C)  TempSrc:  Oral Oral Axillary  SpO2: 95% 96% 96% 96%  Weight:      Height:        Intake/Output Summary (Last 24 hours) at 04/19/2020 1100 Last data filed at 04/19/2020 1000 Gross per 24 hour  Intake 1364.43 ml  Output 2010 ml  Net -645.57 ml   Filed Weights   04/17/20 0726  Weight: 88.5 kg    Exam: General: Awake alert chronically ill no acute distress Respiratory: Respirations slightly shallow breath sounds are clear bilaterally to auscultation I hear no wheeze no crackles Cardiovascular: Regular rate and rhythm no murmur gallop or rubs no lower extremity edema GI: Abdomen is soft nondistended positive bowel sounds throughout intra-abdominal drain intact with small amount serous drainage Neuro: Awake alert oriented x3 speech is slow but clear spasms of his lower extremities with involuntary contractions  Data Reviewed:   I have personally reviewed following labs and imaging studies:  Labs: Labs show the following:   Basic Metabolic Panel: Recent Labs  Lab 04/17/20 0733 04/17/20 0733 04/18/20 0514 04/19/20 0434  NA 145  --  145 146*  K 4.1   < > 4.1 4.5  CL 107  --  112* 108  CO2 27  --  25 25  GLUCOSE 74  --  69* 100*  BUN 23  --  16 19  CREATININE 0.82  --  0.82 0.96  CALCIUM 10.4*  --  9.9 10.1   < > = values in this interval not displayed.   GFR Estimated Creatinine Clearance: 82 mL/min (by C-G formula based on SCr of 0.96 mg/dL). Liver Function Tests: Recent Labs  Lab 04/17/20 0733 04/18/20 0514 04/19/20 0434  AST 59* 60* 60*  ALT 67* 63* 59*  ALKPHOS 197* 173* 158*  BILITOT 0.8 0.7 0.6  PROT 7.1 6.2* 6.6  ALBUMIN 3.4* 3.1* 3.2*   Recent Labs  Lab 04/17/20 0733 04/18/20 0514  LIPASE 68* 49   No results for input(s): AMMONIA in the last 168 hours. Coagulation profile No results for input(s): INR, PROTIME in the last 168 hours.  CBC: Recent Labs  Lab 04/17/20 0733 04/18/20 0514 04/19/20 0434  WBC 5.1 5.7 11.6*  HGB 13.4  12.4* 12.8*  HCT 41.7 38.8* 39.6  MCV 92.1 90.7 91.2  PLT 226 199 227   Cardiac Enzymes: No results for input(s): CKTOTAL, CKMB, CKMBINDEX, TROPONINI in the last 168 hours. BNP (last 3 results) No results for input(s): PROBNP in the last 8760 hours. CBG: No results for input(s): GLUCAP in the last 168 hours. D-Dimer: No results for input(s): DDIMER in the last 72 hours. Hgb A1c: No results for input(s): HGBA1C in the last 72 hours. Lipid Profile: Recent Labs    04/17/20 2257  CHOL 197  HDL 35*  LDLCALC 136*  TRIG 128  CHOLHDL 5.6   Thyroid function studies: No results for input(s): TSH, T4TOTAL, T3FREE, THYROIDAB in the last 72 hours.  Invalid input(s): FREET3 Anemia work up: No results for input(s): VITAMINB12, FOLATE, FERRITIN, TIBC, IRON,  RETICCTPCT in the last 72 hours. Sepsis Labs: Recent Labs  Lab 04/17/20 0733 04/18/20 0514 04/19/20 0434  WBC 5.1 5.7 11.6*    Microbiology Recent Results (from the past 240 hour(s))  Urine Culture     Status: Abnormal (Preliminary result)   Collection Time: 04/17/20 11:42 AM   Specimen: Urine, Catheterized  Result Value Ref Range Status   Specimen Description URINE, CATHETERIZED  Final   Special Requests NONE  Final   Culture (A)  Final    >=100,000 COLONIES/mL STAPHYLOCOCCUS AUREUS 20,000 COLONIES/mL ENTEROCOCCUS FAECALIS    Report Status PENDING  Incomplete   Organism ID, Bacteria STAPHYLOCOCCUS AUREUS (A)  Final      Susceptibility   Staphylococcus aureus - MIC*    CIPROFLOXACIN >=8 RESISTANT Resistant     GENTAMICIN <=0.5 SENSITIVE Sensitive     NITROFURANTOIN <=16 SENSITIVE Sensitive     OXACILLIN <=0.25 SENSITIVE Sensitive     TETRACYCLINE <=1 SENSITIVE Sensitive     VANCOMYCIN <=0.5 SENSITIVE Sensitive     TRIMETH/SULFA <=10 SENSITIVE Sensitive     CLINDAMYCIN <=0.25 SENSITIVE Sensitive     RIFAMPIN <=0.5 SENSITIVE Sensitive     Inducible Clindamycin Value in next row Sensitive      NEGATIVEPerformed at  [facility], [address], [place], [place] [address]    * >=100,000 COLONIES/mL STAPHYLOCOCCUS AUREUS  Respiratory Panel by RT PCR (Flu A&B, Covid) - Nasopharyngeal Swab     Status: None   Collection Time: 04/17/20  1:56 PM   Specimen: Nasopharyngeal Swab  Result Value Ref Range Status   SARS Coronavirus 2 by RT PCR NEGATIVE NEGATIVE Final    Comment: (NOTE) SARS-CoV-2 target nucleic acids are NOT DETECTED. The SARS-CoV-2 RNA is generally detectable in upper respiratoy specimens during the acute phase of infection. The lowest concentration of SARS-CoV-2 viral copies this assay can detect is 131 copies/mL. A negative result does not preclude SARS-Cov-2 infection and should not be used as the sole basis for treatment or other patient management decisions. A negative result may occur with  improper specimen collection/handling, submission of specimen other than nasopharyngeal swab, presence of viral mutation(s) within the areas targeted by this assay, and inadequate number of viral copies (<131 copies/mL). A negative result must be combined with clinical observations, patient history, and epidemiological information. The expected result is Negative. Fact Sheet for Patients:  https://www.moore.com/ Fact Sheet for Healthcare Providers:  https://www.young.biz/ This test is not yet ap proved or cleared by the [place] FDA and  has been authorized for detection and/or diagnosis of SARS-CoV-2 by FDA under an Emergency Use Authorization (EUA). This EUA will remain  in effect (meaning this test can be used) for the duration of the COVID-19 declaration under Section 564(b)(1) of the Act, 21 U.S.C. section 360bbb-3(b)(1), unless the authorization is terminated or revoked sooner.    Influenza A by PCR NEGATIVE NEGATIVE Final   Influenza B by PCR NEGATIVE NEGATIVE Final    Comment: (NOTE) The Xpert Xpress SARS-CoV-2/FLU/RSV assay is intended  as an aid in  the diagnosis of influenza from Nasopharyngeal swab specimens and  should not be used as a sole basis for treatment. Nasal washings and  aspirates are unacceptable for Xpert Xpress SARS-CoV-2/FLU/RSV  testing. Fact Sheet for Patients: https://www.moore.com/ Fact Sheet for Healthcare Providers: https://www.young.biz/ This test is not yet approved or cleared by the [place] FDA and  has been authorized for detection and/or diagnosis of SARS-CoV-2 by  FDA under an Emergency Use Authorization (  EUA). This EUA will remain  in effect (meaning this test can be used) for the duration of the  Covid-19 declaration under Section 564(b)(1) of the Act, 21  U.S.C. section 360bbb-3(b)(1), unless the authorization is  terminated or revoked. Performed at [facility], [address] [place], [place] [address]   Surgical pcr screen     Status: Abnormal   Collection Time: 04/17/20 11:11 PM   Specimen: Nasal Mucosa; Nasal Swab  Result Value Ref Range Status   MRSA, PCR NEGATIVE NEGATIVE Final   Staphylococcus aureus POSITIVE (A) NEGATIVE Final    Comment: (NOTE) The Xpert SA Assay (FDA approved for NASAL specimens in patients 56 years of age and older), is one component of a comprehensive surveillance program. It is not intended to diagnose infection nor to guide or monitor treatment. Performed at [facility], [address], [place], [place] [address]     Procedures and diagnostic studies:  DG Chest Port 1 View  Result Date: 04/17/2020 CLINICAL DATA:  Cough EXAM: PORTABLE CHEST 1 VIEW COMPARISON:  12/13/2019 FINDINGS: Low volume chest with interstitial coarsening at the bases that was also seen previously but correlates with airspace disease by CT from earlier today. Normal heart size and mediastinal contours. No visible effusion or pneumothorax. Artifact from EKG leads. IMPRESSION: Low volume chest with subtle infiltrates at  the bases. This correlates with atelectasis and airspace disease by abdominal CT earlier today. A similar pattern was seen December 2020, question recurrent aspiration. Electronically Signed   By: Marnee Spring M.D.   On: 04/17/2020 11:24   US Abdomen Limited RUQ  Result Date: 04/17/2020 CLINICAL DATA:  Upper abdominal pain, cholelithiasis EXAM: ULTRASOUND ABDOMEN LIMITED RIGHT UPPER QUADRANT COMPARISON:  04/17/2020 FINDINGS: Gallbladder: Multiple shadowing gallstones are identified. Gallbladder is incompletely distended which limits evaluation. No evidence of gallbladder wall thickening or pericholecystic fluid. Common bile duct: Diameter: 4 mm Liver: No focal lesion identified. Within normal limits in parenchymal echogenicity. Portal vein is patent on color Doppler imaging with normal direction of blood flow towards the liver. Other: None. IMPRESSION: 1. Cholelithiasis without cholecystitis. Electronically Signed   By: Sharlet Salina M.D.   On: 04/17/2020 11:07    Medications:   . Chlorhexidine Gluconate Cloth  6 each Topical Daily  . enoxaparin (LOVENOX) injection  40 mg Subcutaneous Q24H  . Gerhardt's butt cream   Topical TID  . mupirocin ointment  1 application Nasal BID  . sodium chloride flush  3 mL Intravenous Q12H   Continuous Infusions: . azithromycin 500 mg (04/19/20 0853)  . cefTRIAXone (ROCEPHIN)  IV 2 g (04/19/20 0802)  . lactated ringers 125 mL/hr at 04/19/20 0006  . lactated ringers Stopped (04/18/20 1046)     LOS: 2 days   Gwenyth Bender NP [facility]   How to contact the [facility] Attending or Consulting provider 7A - 7P or covering provider during after hours 7P -7A, for this patient?  1. Check the care team in [facility] and look for a) attending/consulting [facility] provider listed and b) the [facility] team listed 2. Log into [email or web address] and use [facility] universal password to access. If you do not have the password, please contact the hospital operator. 3. Locate the [facility]  provider you are looking for under [facility] and page to a number that you can be directly reached. 4. If you still have difficulty reaching the provider, please page the [facility] (Director on Call) for the Hospitalists listed on amion for assistance.  04/19/2020, 11:00 AM

## 2020-04-19 NOTE — Anesthesia Postprocedure Evaluation (Signed)
Anesthesia Post Note  Patient: Stephen Wade  Procedure(s) Performed: LAPAROSCOPIC CHOLECYSTECTOMY WITH attempted INTRAOPERATIVE CHOLANGIOGRAM (N/A )     Patient location during evaluation: PACU Anesthesia Type: General Level of consciousness: awake and alert Pain management: pain level controlled Vital Signs Assessment: post-procedure vital signs reviewed and stable Respiratory status: spontaneous breathing, nonlabored ventilation and respiratory function stable Cardiovascular status: blood pressure returned to baseline and stable Postop Assessment: no apparent nausea or vomiting Anesthetic complications: no    Last Vitals:  Vitals:   04/19/20 0433 04/19/20 1010  BP: 122/75 121/74  Pulse: (!) 108 84  Resp: 20 16  Temp: (!) 36.4 C (!) 36.3 C  SpO2: 96% 96%    Last Pain:  Vitals:   04/19/20 1010  TempSrc: Axillary  PainSc:                  Beryle Lathe

## 2020-04-19 NOTE — Plan of Care (Signed)
  Problem: Education: Goal: Knowledge of General Education information will improve Description: Including pain rating scale, medication(s)/side effects and non-pharmacologic comfort measures Outcome: Completed/Met   Problem: Clinical Measurements: Goal: Ability to maintain clinical measurements within normal limits will improve Outcome: Completed/Met Goal: Diagnostic test results will improve Outcome: Completed/Met

## 2020-04-20 ENCOUNTER — Inpatient Hospital Stay (HOSPITAL_COMMUNITY): Payer: Medicare Other

## 2020-04-20 DIAGNOSIS — K851 Biliary acute pancreatitis without necrosis or infection: Secondary | ICD-10-CM | POA: Diagnosis not present

## 2020-04-20 LAB — CBC
HCT: 40.5 % (ref 39.0–52.0)
Hemoglobin: 13.1 g/dL (ref 13.0–17.0)
MCH: 29.9 pg (ref 26.0–34.0)
MCHC: 32.3 g/dL (ref 30.0–36.0)
MCV: 92.5 fL (ref 80.0–100.0)
Platelets: 217 10*3/uL (ref 150–400)
RBC: 4.38 MIL/uL (ref 4.22–5.81)
RDW: 16.2 % — ABNORMAL HIGH (ref 11.5–15.5)
WBC: 11.7 10*3/uL — ABNORMAL HIGH (ref 4.0–10.5)
nRBC: 0 % (ref 0.0–0.2)

## 2020-04-20 LAB — COMPREHENSIVE METABOLIC PANEL
ALT: 44 U/L (ref 0–44)
AST: 46 U/L — ABNORMAL HIGH (ref 15–41)
Albumin: 3 g/dL — ABNORMAL LOW (ref 3.5–5.0)
Alkaline Phosphatase: 140 U/L — ABNORMAL HIGH (ref 38–126)
Anion gap: 12 (ref 5–15)
BUN: 18 mg/dL (ref 8–23)
CO2: 28 mmol/L (ref 22–32)
Calcium: 9.8 mg/dL (ref 8.9–10.3)
Chloride: 107 mmol/L (ref 98–111)
Creatinine, Ser: 0.93 mg/dL (ref 0.61–1.24)
GFR calc Af Amer: >60 mL/min (ref >60)
GFR calc non Af Amer: >60 mL/min (ref >60)
Glucose, Bld: 108 mg/dL — ABNORMAL HIGH (ref 70–99)
Potassium: 4.1 mmol/L (ref 3.5–5.1)
Sodium: 147 mmol/L — ABNORMAL HIGH (ref 135–145)
Total Bilirubin: 1.4 mg/dL — ABNORMAL HIGH (ref 0.3–1.2)
Total Protein: 6.2 g/dL — ABNORMAL LOW (ref 6.5–8.1)

## 2020-04-20 LAB — URINE CULTURE: Culture: >=100000 — AB

## 2020-04-20 MED ORDER — LOPERAMIDE HCL 2 MG PO CAPS: 2.0000 mg | ORAL_CAPSULE | ORAL | Status: DC | PRN

## 2020-04-20 NOTE — Progress Notes (Addendum)
2 Days Post-Op  Subjective: CC: Nausea, emesis  Patient noted to be nauseated and vomiting.  He reports he has vomited several times over the last several hours.  He has bilious output and is suctioning from his mouth in a canister.  He reports his abdomen feels distended.  He is having some right upper quadrant abdominal pain.  He reports no real intake over the last 24 hours despite being offered clear liquids.  Objective: Vital signs in last 24 hours: Temp:  [98.2 F (36.8 C)-98.5 F (36.9 C)] 98.4 F (36.9 C) (04/29 0910) Pulse Rate:  [74-99] 98 (04/29 0910) Resp:  [18-19] 18 (04/29 0910) BP: (132-150)/(72-74) 132/74 (04/29 0910) SpO2:  [93 %-94 %] 94 % (04/29 0910) Last BM Date: 04/17/20  Intake/Output from previous day: 04/28 0701 - 04/29 0700 In: 2975.1 [P.O.:60; I.V.:2915.1] Out: 1101 [Urine:1000; Emesis/NG output:1; Drains:100] Intake/Output this shift: Total I/O In: 120 [P.O.:120] Out: 0   PE: Gen:  Alert, NAD, pleasant Abd: Moderate distention but soft.  Tenderness in the right upper quadrant without rigidity or guarding.  Hypoactive bowel sounds.  Incisions with glue intact appears well and are without drainage, bleeding, or signs of infection. Drain SS w/ 100cc/24 hours.  Ext:  No LE edema Skin: no rashes noted, warm and dry  Lab Results:  Recent Labs    04/19/20 0434 04/20/20 0520  WBC 11.6* 11.7*  HGB 12.8* 13.1  HCT 39.6 40.5  PLT 227 217   BMET Recent Labs    04/19/20 0434 04/20/20 0520  NA 146* 147*  K 4.5 4.1  CL 108 107  CO2 25 28  GLUCOSE 100* 108*  BUN 19 18  CREATININE 0.96 0.93  CALCIUM 10.1 9.8   PT/INR No results for input(s): LABPROT, INR in the last 72 hours. CMP     Component Value Date/Time   NA 147 (H) 04/20/2020 0520   NA 137 02/01/2020 0000   K 4.1 04/20/2020 0520   CL 107 04/20/2020 0520   CO2 28 04/20/2020 0520   GLUCOSE 108 (H) 04/20/2020 0520   BUN 18 04/20/2020 0520   BUN 20 02/01/2020 0000   CREATININE  0.93 04/20/2020 0520   CALCIUM 9.8 04/20/2020 0520   PROT 6.2 (L) 04/20/2020 0520   PROT 7.3 03/08/2020 1628   ALBUMIN 3.0 (L) 04/20/2020 0520   ALBUMIN 4.4 03/08/2020 1628   AST 46 (H) 04/20/2020 0520   ALT 44 04/20/2020 0520   ALKPHOS 140 (H) 04/20/2020 0520   BILITOT 1.4 (H) 04/20/2020 0520   BILITOT 0.4 03/08/2020 1628   GFRNONAA >60 04/20/2020 0520   GFRAA >60 04/20/2020 0520   Lipase     Component Value Date/Time   LIPASE 49 04/18/2020 0514       Studies/Results: DG Abd 1 View  Result Date: 04/20/2020 CLINICAL DATA:  Vomiting.  History of pancreatitis EXAM: ABDOMEN - 1 VIEW COMPARISON:  April 08, 2010 FINDINGS: There are loops of borderline distended small bowel in the mid abdomen. No appreciable air-fluid levels. No free air. There is moderate gastric distension of the stomach. There are surgical clips in the right upper abdomen. Moderate stool noted in the colon. IMPRESSION: Loops of borderline dilated small bowel in the mid abdomen. This appearance raises question of a degree of ileus or enteritis. Bowel obstruction felt to be less likely. No evident free air. Surgical clips right upper abdomen. Electronically Signed   By: Bretta Bang III M.D.   On: 04/20/2020 10:14  Anti-infectives: Anti-infectives (From admission, onward)   Start     Dose/Rate Route Frequency Ordered Stop   04/20/20 0800  Ampicillin-Sulbactam (UNASYN) 3 g in sodium chloride 0.9 % 100 mL IVPB     3 g 200 mL/hr over 30 Minutes Intravenous Every 6 hours 04/19/20 1118     04/19/20 1130  Ampicillin-Sulbactam (UNASYN) 3 g in sodium chloride 0.9 % 100 mL IVPB  Status:  Discontinued     3 g 200 mL/hr over 30 Minutes Intravenous Every 6 hours 04/19/20 1117 04/19/20 1118   04/18/20 1000  cefTRIAXone (ROCEPHIN) 2 g in sodium chloride 0.9 % 100 mL IVPB  Status:  Discontinued     2 g 200 mL/hr over 30 Minutes Intravenous Every 24 hours 04/17/20 1437 04/19/20 1117   04/18/20 1000  azithromycin  (ZITHROMAX) 500 mg in sodium chloride 0.9 % 250 mL IVPB  Status:  Discontinued     500 mg 250 mL/hr over 60 Minutes Intravenous Every 24 hours 04/17/20 1437 04/19/20 1117   04/17/20 1145  cefTRIAXone (ROCEPHIN) 1 g in sodium chloride 0.9 % 100 mL IVPB     1 g 200 mL/hr over 30 Minutes Intravenous  Once 04/17/20 1141 04/17/20 1415   04/17/20 1145  azithromycin (ZITHROMAX) 500 mg in sodium chloride 0.9 % 250 mL IVPB     500 mg 250 mL/hr over 60 Minutes Intravenous  Once 04/17/20 1141 04/17/20 1902       Assessment/Plan MS Stenosis of right common iliac noted on CT UTI vs asymptomatic bacteriuria in setting of chronic indwelling Foley Aspiration PNA - Above per TRH -   Biliary Pancreatitis  Acute on Chronic Cholecystitis  S/p Lap Chole, Dr. Ninfa Linden, 04/18/2020, POD #2 -Nausea, emesis this morning with dilated loops of bowel on x-ray.  NG tube inserted with bilious return.  Continue on LIWS - Maintain Drain  - LFT downtrending. T bili up slightly at 1.4 from 0.6.  - Path with Chronic cholecystitis with multinucleated giant cells and Cholelithiasis.  No evidence of malignancy.   FEN - N.p.o., IV fluids, NG tube VTE - SCDs, Lovenox ID - Rocephin/Azithromycin 4/26 - 4/28. Unasyn 4/28 (for asp PNA - Per [facility]) >> WBC 11.7 Foley - chronic indwelling  Follow-Up - TBD   LOS: 3 days    Jillyn Ledger , [facility] Surgery 04/20/2020, 10:58 AM Please see Amion for pager number during day hours 7:00am-4:30pm

## 2020-04-20 NOTE — Plan of Care (Signed)
  Problem: Health Behavior/Discharge Planning: Goal: Ability to manage health-related needs will improve Outcome: Progressing   Problem: Activity: Goal: Risk for activity intolerance will decrease Outcome: Progressing   Problem: Pain Managment: Goal: General experience of comfort will improve Outcome: Progressing   

## 2020-04-20 NOTE — Progress Notes (Signed)
Patient continues to vomit dark emesis. Toya Smothers, NP aware. Abdominal Xray obtained and results posted. Toya Smothers, NP made aware and General Surgery was paged. General Surgery, PA and Toya Smothers, NP at bedside.  Patient has Yankauer at bedside and is using. Patient is sitting up in high position to decrease risk of aspiration. PA placed orders for NG tube to be placed. Orders followed. Chauncy Lean, RN at bedside. Will continue to monitor.

## 2020-04-20 NOTE — Progress Notes (Signed)
Progress Note    Stephen Wade  LNL:[id number] DOB: 03/11/1951  DOA: 04/17/2020 PCP: Wanda Plump, MD    Brief Narrative:     Medical records reviewed and are as summarized below:  Stephen Wade is an 69 y.o. male Stephen Wade is an 69 y.o. male with a past medical history that includes severe MS bedbound, dysphagia, chronic indwelling Foley catheter, cholelithiasis, paroxysmal atrial fibrillation not on anticoagulation, GERD admitted April 26 with acute biliary pancreatitis status post laparoscopic cholecystectomy on April 27. Continues with intermittent emesis, unable to tolerate clear liquids. Progressing slowly as GS predicted.   Assessment/Plan:   Principal Problem:   Pancreatitis Active Problems:   Multiple sclerosis (HCC)   Transaminitis   Dysphagia   URINARY CALCULUS   Spastic gait   Pressure injury of skin   Chronic indwelling Foley catheter   Hypercalcemia   Neurogenic bladder   Palliative care patient  #1.  Acute biliary pancreatitis.  Status post laparoscopic cholecystectomy with attempted intraoperative cholangiogram.  Patient continues with nausea and one episode of emesis during the night. Emesis appears dark green. Small amount last night with night before. Managed by general surgery who opine patient will likely need several days to recover postop and recommend advancing slowly with p.o.'s given his high risk for aspiration.  LFTs continue to trend down slowly with uptick of total bili.   -Supportive therapy -monitor intake and output -Further management per general surgery  #2.  History of dysphagia and aspiration pneumonia.  Imaging concerning for airspace opacities in bilateral lower lungs.  Patient aware of risks and declines a modified diet.  Appetite remains  poor.  Continuing on clears for now. See #1.  He remains afebrile hemodynamically stable and nontoxic-appearing -Continue Rocephin and Zithromax for now -Monitor  #3.  Chronic  indwelling Foley catheter, possible UTI versus asymptomatic bacteriuria.  Acute on chronic.  Urine culture with staph aureus.  Of note patient has chronic indwelling Foley catheter due to neurogenic bladder with history of recurrent UTIs.  Currently his wife flushes catheter twice daily with water and vinegar and catheter is changed out weekly. -Continue home cath care -Continue antibiotics as noted above  #4.  Hypercalcemia.  Acute.  Resolved  #5.  Elevated LFTs.  See #1.  Likely related to #1 -Monitor  #6.  Multiple sclerosis with spastic gait.  Patient mostly wheelchair/bedbound at baseline.  He does have frequent spasms of lower extremities.  Home medications include baclofen and Teriflunonmide -Continue home meds  #7.  Pressure ulcer of sacrum.  Present on admission.  Acute on chronic.  Evaluated by [facility].  Stage II. -Dressing changes per [facility] recommendations -Low air loss bed replacement  #8.  Paroxysmal atrial fibrillation.  Currently in sinus rhythm.  CHA2DS2-VASc score 1.  Not on anticoagulation -Monitor  #10.  Nephrolithiasis/bladder calculi.  Incidental finding seen on CT.  Nonobstructive.   Family Communication/Anticipated D/C date and plan/Code Status   DVT prophylaxis: Lovenox ordered. Code Status: Full Code.  Family Communication: patient Disposition Plan: Status is: Inpatient  Remains inpatient appropriate because:Inpatient level of care appropriate due to severity of illness   Dispo: The patient is from: Home              Anticipated d/c is to: Home              Anticipated d/c date is: 3 days  Patient currently is not medically stable to d/c.          Medical Consultants:   Magnus Ivan general surgure   Anti-Infectives:    Rocephin  azithromycin  Subjective:   Eyes close. Awakens to verbal stimuli. Complains continued nausea  Objective:    Vitals:   04/19/20 0433 04/19/20 1010 04/19/20 2035 04/20/20 0457  BP: 122/75  121/74 135/72 (!) 150/72  Pulse: (!) 108 84 74 99  Resp: 20 16 18 19   Temp: (!) 97.5 F (36.4 C) (!) 97.4 F (36.3 C) 98.5 F (36.9 C) 98.2 F (36.8 C)  TempSrc: Oral Axillary  Oral  SpO2: 96% 96% 93% 93%  Weight:      Height:        Intake/Output Summary (Last 24 hours) at 04/20/2020 0910 Last data filed at 04/20/2020 0700 Gross per 24 hour  Intake 2975.12 ml  Output 1101 ml  Net 1874.12 ml   Filed Weights   04/17/20 0726  Weight: 88.5 kg    Exam: General: Awake alert chronically ill no acute distress Respiratory: Respirations slightly shallow breath sounds are clear bilaterally to auscultation I hear no wheeze no crackles Cardiovascular: Regular rate and rhythm no murmur gallop or rubs no lower extremity edema GI: Abdomen is soft nondistended positive bowel sounds throughout intra-abdominal drain intact with small amount serous drainage Neuro: Awake alert oriented x3 speech is slow but clear spasms of his lower extremities with involuntary contractions  Data Reviewed:   I have personally reviewed following labs and imaging studies:  Labs: Labs show the following:   Basic Metabolic Panel: Recent Labs  Lab 04/17/20 0733 04/17/20 0733 04/18/20 0514 04/18/20 0514 04/19/20 0434 04/20/20 0520  NA 145  --  145  --  146* 147*  K 4.1   < > 4.1   < > 4.5 4.1  CL 107  --  112*  --  108 107  CO2 27  --  25  --  25 28  GLUCOSE 74  --  69*  --  100* 108*  BUN 23  --  16  --  19 18  CREATININE 0.82  --  0.82  --  0.96 0.93  CALCIUM 10.4*  --  9.9  --  10.1 9.8   < > = values in this interval not displayed.   GFR Estimated Creatinine Clearance: 84.6 mL/min (by C-G formula based on SCr of 0.93 mg/dL). Liver Function Tests: Recent Labs  Lab 04/17/20 0733 04/18/20 0514 04/19/20 0434 04/20/20 0520  AST 59* 60* 60* 46*  ALT 67* 63* 59* 44  ALKPHOS 197* 173* 158* 140*  BILITOT 0.8 0.7 0.6 1.4*  PROT 7.1 6.2* 6.6 6.2*  ALBUMIN 3.4* 3.1* 3.2* 3.0*   Recent Labs    Lab 04/17/20 0733 04/18/20 0514  LIPASE 68* 49   No results for input(s): AMMONIA in the last 168 hours. Coagulation profile No results for input(s): INR, PROTIME in the last 168 hours.  CBC: Recent Labs  Lab 04/17/20 0733 04/18/20 0514 04/19/20 0434 04/20/20 0520  WBC 5.1 5.7 11.6* 11.7*  HGB 13.4 12.4* 12.8* 13.1  HCT 41.7 38.8* 39.6 40.5  MCV 92.1 90.7 91.2 92.5  PLT 226 199 227 217   Cardiac Enzymes: No results for input(s): CKTOTAL, CKMB, CKMBINDEX, TROPONINI in the last 168 hours. BNP (last 3 results) No results for input(s): PROBNP in the last 8760 hours. CBG: No results for input(s): GLUCAP in the last 168 hours. D-Dimer: No results for input(s):  DDIMER in the last 72 hours. Hgb A1c: No results for input(s): HGBA1C in the last 72 hours. Lipid Profile: Recent Labs    04/17/20 2257  CHOL 197  HDL 35*  LDLCALC 136*  TRIG 128  CHOLHDL 5.6   Thyroid function studies: No results for input(s): TSH, T4TOTAL, T3FREE, THYROIDAB in the last 72 hours.  Invalid input(s): FREET3 Anemia work up: No results for input(s): VITAMINB12, FOLATE, FERRITIN, TIBC, IRON, RETICCTPCT in the last 72 hours. Sepsis Labs: Recent Labs  Lab 04/17/20 0733 04/18/20 0514 04/19/20 0434 04/20/20 0520  WBC 5.1 5.7 11.6* 11.7*    Microbiology Recent Results (from the past 240 hour(s))  Urine Culture     Status: Abnormal   Collection Time: 04/17/20 11:42 AM   Specimen: Urine, Catheterized  Result Value Ref Range Status   Specimen Description URINE, CATHETERIZED  Final   Special Requests   Final    NONE Performed at [facility], [address], [place], [place] [address]    Culture (A)  Final    >=100,000 COLONIES/mL STAPHYLOCOCCUS AUREUS 20,000 COLONIES/mL ENTEROCOCCUS FAECALIS VANCOMYCIN RESISTANT ENTEROCOCCUS ISOLATED    Report Status 04/20/2020 FINAL  Final   Organism ID, Bacteria STAPHYLOCOCCUS AUREUS (A)  Final   Organism ID, Bacteria ENTEROCOCCUS FAECALIS (A)   Final      Susceptibility   Enterococcus faecalis - MIC*    AMPICILLIN <=2 SENSITIVE Sensitive     NITROFURANTOIN <=16 SENSITIVE Sensitive     VANCOMYCIN >=32 RESISTANT Resistant     LINEZOLID 2 SENSITIVE Sensitive     * 20,000 COLONIES/mL ENTEROCOCCUS FAECALIS   Staphylococcus aureus - MIC*    CIPROFLOXACIN >=8 RESISTANT Resistant     GENTAMICIN <=0.5 SENSITIVE Sensitive     NITROFURANTOIN <=16 SENSITIVE Sensitive     OXACILLIN <=0.25 SENSITIVE Sensitive     TETRACYCLINE <=1 SENSITIVE Sensitive     VANCOMYCIN <=0.5 SENSITIVE Sensitive     TRIMETH/SULFA <=10 SENSITIVE Sensitive     CLINDAMYCIN <=0.25 SENSITIVE Sensitive     RIFAMPIN <=0.5 SENSITIVE Sensitive     Inducible Clindamycin NEGATIVE Sensitive     * >=100,000 COLONIES/mL STAPHYLOCOCCUS AUREUS  Respiratory Panel by RT PCR (Flu A&B, Covid) - Nasopharyngeal Swab     Status: None   Collection Time: 04/17/20  1:56 PM   Specimen: Nasopharyngeal Swab  Result Value Ref Range Status   SARS Coronavirus 2 by RT PCR NEGATIVE NEGATIVE Final    Comment: (NOTE) SARS-CoV-2 target nucleic acids are NOT DETECTED. The SARS-CoV-2 RNA is generally detectable in upper respiratoy specimens during the acute phase of infection. The lowest concentration of SARS-CoV-2 viral copies this assay can detect is 131 copies/mL. A negative result does not preclude SARS-Cov-2 infection and should not be used as the sole basis for treatment or other patient management decisions. A negative result may occur with  improper specimen collection/handling, submission of specimen other than nasopharyngeal swab, presence of viral mutation(s) within the areas targeted by this assay, and inadequate number of viral copies (<131 copies/mL). A negative result must be combined with clinical observations, patient history, and epidemiological information. The expected result is Negative. Fact Sheet for Patients:  PinkCheek.be Fact Sheet for  Healthcare Providers:  GravelBags.it This test is not yet ap proved or cleared by the [place] FDA and  has been authorized for detection and/or diagnosis of SARS-CoV-2 by FDA under an Emergency Use Authorization (EUA). This EUA will remain  in effect (meaning this test can be used) for  the duration of the COVID-19 declaration under Section 564(b)(1) of the Act, 21 U.S.C. section 360bbb-3(b)(1), unless the authorization is terminated or revoked sooner.    Influenza A by PCR NEGATIVE NEGATIVE Final   Influenza B by PCR NEGATIVE NEGATIVE Final    Comment: (NOTE) The Xpert Xpress SARS-CoV-2/FLU/RSV assay is intended as an aid in  the diagnosis of influenza from Nasopharyngeal swab specimens and  should not be used as a sole basis for treatment. Nasal washings and  aspirates are unacceptable for Xpert Xpress SARS-CoV-2/FLU/RSV  testing. Fact Sheet for Patients: https://www.moore.com/ Fact Sheet for Healthcare Providers: https://www.young.biz/ This test is not yet approved or cleared by the [place] FDA and  has been authorized for detection and/or diagnosis of SARS-CoV-2 by  FDA under an Emergency Use Authorization (EUA). This EUA will remain  in effect (meaning this test can be used) for the duration of the  Covid-19 declaration under Section 564(b)(1) of the Act, 21  U.S.C. section 360bbb-3(b)(1), unless the authorization is  terminated or revoked. Performed at [facility], [address] [place], [place] [address]   Surgical pcr screen     Status: Abnormal   Collection Time: 04/17/20 11:11 PM   Specimen: Nasal Mucosa; Nasal Swab  Result Value Ref Range Status   MRSA, PCR NEGATIVE NEGATIVE Final   Staphylococcus aureus POSITIVE (A) NEGATIVE Final    Comment: (NOTE) The Xpert SA Assay (FDA approved for NASAL specimens in patients 51 years of age and older), is one component of a  comprehensive surveillance program. It is not intended to diagnose infection nor to guide or monitor treatment. Performed at [facility], [address] [place], [place] [address]     Procedures and diagnostic studies:  No results found.  Medications:   . Chlorhexidine Gluconate Cloth  6 each Topical Daily  . enoxaparin (LOVENOX) injection  40 mg Subcutaneous Q24H  . Gerhardt's butt cream   Topical TID  . mupirocin ointment  1 application Nasal BID  . senna-docusate  1 tablet Oral BID  . sodium chloride flush  3 mL Intravenous Q12H   Continuous Infusions: . ampicillin-sulbactam (UNASYN) IV 3 g (04/20/20 0832)  . lactated ringers 125 mL/hr at 04/20/20 0827  . lactated ringers Stopped (04/18/20 1046)     LOS: 3 days   Gwenyth Bender NP [facility]   How to contact the [facility] Attending or Consulting provider 7A - 7P or covering provider during after hours 7P -7A, for this patient?  1. Check the care team in [facility] and look for a) attending/consulting [facility] provider listed and b) the [facility] team listed 2. Log into [email or web address] and use [facility]'s universal password to access. If you do not have the password, please contact the hospital operator. 3. Locate the [facility] provider you are looking for under [facility] and page to a number that you can be directly reached. 4. If you still have difficulty reaching the provider, please page the [facility] (Director on Call) for the Hospitalists listed on amion for assistance.  04/20/2020, 9:10 AM

## 2020-04-21 ENCOUNTER — Inpatient Hospital Stay (HOSPITAL_COMMUNITY): Payer: Medicare Other

## 2020-04-21 DIAGNOSIS — K851 Biliary acute pancreatitis without necrosis or infection: Secondary | ICD-10-CM | POA: Diagnosis not present

## 2020-04-21 LAB — COMPREHENSIVE METABOLIC PANEL
ALT: 30 U/L (ref 0–44)
AST: 21 U/L (ref 15–41)
Albumin: 2.4 g/dL — ABNORMAL LOW (ref 3.5–5.0)
Alkaline Phosphatase: 117 U/L (ref 38–126)
Anion gap: 13 (ref 5–15)
BUN: 21 mg/dL (ref 8–23)
CO2: 27 mmol/L (ref 22–32)
Calcium: 9.3 mg/dL (ref 8.9–10.3)
Chloride: 108 mmol/L (ref 98–111)
Creatinine, Ser: 0.9 mg/dL (ref 0.61–1.24)
GFR calc Af Amer: >60 mL/min (ref >60)
GFR calc non Af Amer: >60 mL/min (ref >60)
Glucose, Bld: 92 mg/dL (ref 70–99)
Potassium: 3.6 mmol/L (ref 3.5–5.1)
Sodium: 148 mmol/L — ABNORMAL HIGH (ref 135–145)
Total Bilirubin: 0.9 mg/dL (ref 0.3–1.2)
Total Protein: 5.5 g/dL — ABNORMAL LOW (ref 6.5–8.1)

## 2020-04-21 LAB — CBC
HCT: 40.4 % (ref 39.0–52.0)
Hemoglobin: 12.9 g/dL — ABNORMAL LOW (ref 13.0–17.0)
MCH: 29.3 pg (ref 26.0–34.0)
MCHC: 31.9 g/dL (ref 30.0–36.0)
MCV: 91.8 fL (ref 80.0–100.0)
Platelets: 230 10*3/uL (ref 150–400)
RBC: 4.4 MIL/uL (ref 4.22–5.81)
RDW: 16.5 % — ABNORMAL HIGH (ref 11.5–15.5)
WBC: 14.2 10*3/uL — ABNORMAL HIGH (ref 4.0–10.5)
nRBC: 0 % (ref 0.0–0.2)

## 2020-04-21 LAB — LIPASE, BLOOD: Lipase: 20 U/L (ref 11–51)

## 2020-04-21 MED ORDER — WHITE PETROLATUM EX OINT
TOPICAL_OINTMENT | CUTANEOUS | Status: AC
  Administered 2020-04-21: 0.2
  Filled 2020-04-21: qty 28.35

## 2020-04-21 NOTE — Progress Notes (Signed)
Progress Note    Stephen Wade  TKZ:[id number] DOB: 05/05/1951  DOA: 04/17/2020 PCP: Wanda Plump, MD    Brief Narrative:    Medical records reviewed and are as summarized below:  Stephen Wade is an 69 y.o. male with a past medical history that includes severe MS bedbound, dysphagia, chronic indwelling Foley catheter, cholelithiasis, paroxysmal atrial fibrillation not on anticoagulation, GERD who was admitted April 26 with acute biliary pancreatitis status post laparoscopic cholecystectomy on April 27.  Patient had persistent nausea with vomiting x-ray concerning for an ileus and an NG tube was placed on April 29.  Assessment/Plan:   Principal Problem:   Pancreatitis Active Problems:   Multiple sclerosis (HCC)   Transaminitis   Dysphagia   URINARY CALCULUS   Spastic gait   Pressure injury of skin   Chronic indwelling Foley catheter   Hypercalcemia   Neurogenic bladder   Palliative care patient   #1.  Biliary pancreatitis.  Status post laparoscopic cholecystectomy as noted above.  Patient tolerating an NG tube.  NG drainage greater than a liter over the last 24 hours.  X-ray yesterday concerning for an ileus.  Managed by general surgery who opined patient will likely need several days to recover postop.  LFTs within the limits of normal today. -Management per general surgery -Supportive therapy -Monitor intake and output  #2.  History of dysphagia and aspiration pneumonia.  Imaging on admission concerning for airspace opacities in bilateral lower lungs.  Patient aware of risks.  Currently is n.p.o. with an NG tube.  He remains afebrile hemodynamically stable and nontoxic-appearing -Continue Rocephin and Zithromax -Monitor  #3.  Chronic indwelling Foley catheter possible UTI versus asymptomatic bacteriuria.  Urine culture with staph aureus and enterococcus faecalis -Antibiotics as noted above -Continue Foley care  #4.  Hypercalcemia.  Resolved  #5.  Elevated LFTs.  See  #1.  #6.  Multiple sclerosis with spastic gait.  Patient mostly wheelchair/bedbound at baseline.  Home medications include baclofen and Teriflunonmide -Continue home meds  #7.  Pressure ulcer of sacrum.  Present on admission.  Acute on chronic.  Stage II -Wound care per WOC recommendations -Low air loss bed replacement  #8.  Paroxysmal A. fib.  Remains in sinus rhythm.  [place] Vascor 1.  Not on anticoagulation  #9 nephrolithiasis/bladder calculi.  Incidental finding on CT.  Nonobstructive   Family Communication/Anticipated D/C date and plan/Code Status   DVT prophylaxis: Lovenox ordered. Code Status: Full Code.  Family Communication: patient  Disposition Plan: Status is: Inpatient  Remains inpatient appropriate because:Inpatient level of care appropriate due to severity of illness   Dispo: The patient is from: Home              Anticipated d/c is to: Home              Anticipated d/c date is: > 3 days              Patient currently is not medically stable to d/c.          Medical Consultants:    Dr. Magnus Ivan general surgery   Anti-Infectives:    Rocephin  Azithromycin  Subjective:   Lying in bed with eyes closed.  Awakens to verbal stimuli.  Reports feeling much more comfortable.  Objective:    Vitals:   04/20/20 0910 04/20/20 2249 04/21/20 0500 04/21/20 0910  BP: 132/74 (!) 145/81 125/76 123/70  Pulse: 98 (!) 103 95 91  Resp: 18 16 18  18  Temp: 98.4 F (36.9 C)  97.8 F (36.6 C) (!) 97.4 F (36.3 C)  TempSrc: Oral  Axillary Axillary  SpO2: 94% 93% 94% 92%  Weight:      Height:        Intake/Output Summary (Last 24 hours) at 04/21/2020 1013 Last data filed at 04/21/2020 0800 Gross per 24 hour  Intake 2997.78 ml  Output 3217 ml  Net -219.22 ml   Filed Weights   04/17/20 0726  Weight: 88.5 kg    Exam: General: Somewhat sleepy but in no acute distress CV: Regular rate and rhythm no murmur gallop or rub Respiratory: No increased work of  breathing breath sounds are distant but clear I hear no crackles no wheezes Abdomen: Soft less distended today very sluggish bowel sounds JP drain intact draining moderate amount serous drainage NG tube in place draining large amount of dark greenish liquid Neuro: Oriented x3 speech is slow but clear  Data Reviewed:   I have personally reviewed following labs and imaging studies:  Labs: Labs show the following:   Basic Metabolic Panel: Recent Labs  Lab 04/17/20 0733 04/17/20 0733 04/18/20 0514 04/18/20 0514 04/19/20 0434 04/19/20 0434 04/20/20 0520 04/21/20 0625  NA 145  --  145  --  146*  --  147* 148*  K 4.1   < > 4.1   < > 4.5   < > 4.1 3.6  CL 107  --  112*  --  108  --  107 108  CO2 27  --  25  --  25  --  28 27  GLUCOSE 74  --  69*  --  100*  --  108* 92  BUN 23  --  16  --  19  --  18 21  CREATININE 0.82  --  0.82  --  0.96  --  0.93 0.90  CALCIUM 10.4*  --  9.9  --  10.1  --  9.8 9.3   < > = values in this interval not displayed.   GFR Estimated Creatinine Clearance: 87.4 mL/min (by C-G formula based on SCr of 0.9 mg/dL). Liver Function Tests: Recent Labs  Lab 04/17/20 0733 04/18/20 0514 04/19/20 0434 04/20/20 0520 04/21/20 0625  AST 59* 60* 60* 46* 21  ALT 67* 63* 59* 44 30  ALKPHOS 197* 173* 158* 140* 117  BILITOT 0.8 0.7 0.6 1.4* 0.9  PROT 7.1 6.2* 6.6 6.2* 5.5*  ALBUMIN 3.4* 3.1* 3.2* 3.0* 2.4*   Recent Labs  Lab 04/17/20 0733 04/18/20 0514 04/21/20 0625  LIPASE 68* 49 20   No results for input(s): AMMONIA in the last 168 hours. Coagulation profile No results for input(s): INR, PROTIME in the last 168 hours.  CBC: Recent Labs  Lab 04/17/20 0733 04/18/20 0514 04/19/20 0434 04/20/20 0520 04/21/20 0625  WBC 5.1 5.7 11.6* 11.7* 14.2*  HGB 13.4 12.4* 12.8* 13.1 12.9*  HCT 41.7 38.8* 39.6 40.5 40.4  MCV 92.1 90.7 91.2 92.5 91.8  PLT 226 199 227 217 230   Cardiac Enzymes: No results for input(s): CKTOTAL, CKMB, CKMBINDEX, TROPONINI in  the last 168 hours. BNP (last 3 results) No results for input(s): PROBNP in the last 8760 hours. CBG: No results for input(s): GLUCAP in the last 168 hours. D-Dimer: No results for input(s): DDIMER in the last 72 hours. Hgb A1c: No results for input(s): HGBA1C in the last 72 hours. Lipid Profile: No results for input(s): CHOL, HDL, LDLCALC, TRIG, CHOLHDL, LDLDIRECT in the last 72 hours.  Thyroid function studies: No results for input(s): TSH, T4TOTAL, T3FREE, THYROIDAB in the last 72 hours.  Invalid input(s): FREET3 Anemia work up: No results for input(s): VITAMINB12, FOLATE, FERRITIN, TIBC, IRON, RETICCTPCT in the last 72 hours. Sepsis Labs: Recent Labs  Lab 04/18/20 0514 04/19/20 0434 04/20/20 0520 04/21/20 0625  WBC 5.7 11.6* 11.7* 14.2*    Microbiology Recent Results (from the past 240 hour(s))  Urine Culture     Status: Abnormal   Collection Time: 04/17/20 11:42 AM   Specimen: Urine, Catheterized  Result Value Ref Range Status   Specimen Description URINE, CATHETERIZED  Final   Special Requests   Final    NONE Performed at [facility], [address], [place], [place] [address]    Culture (A)  Final    >=100,000 COLONIES/mL STAPHYLOCOCCUS AUREUS 20,000 COLONIES/mL ENTEROCOCCUS FAECALIS VANCOMYCIN RESISTANT ENTEROCOCCUS ISOLATED    Report Status 04/20/2020 FINAL  Final   Organism ID, Bacteria STAPHYLOCOCCUS AUREUS (A)  Final   Organism ID, Bacteria ENTEROCOCCUS FAECALIS (A)  Final      Susceptibility   Enterococcus faecalis - MIC*    AMPICILLIN <=2 SENSITIVE Sensitive     NITROFURANTOIN <=16 SENSITIVE Sensitive     VANCOMYCIN >=32 RESISTANT Resistant     LINEZOLID 2 SENSITIVE Sensitive     * 20,000 COLONIES/mL ENTEROCOCCUS FAECALIS   Staphylococcus aureus - MIC*    CIPROFLOXACIN >=8 RESISTANT Resistant     GENTAMICIN <=0.5 SENSITIVE Sensitive     NITROFURANTOIN <=16 SENSITIVE Sensitive     OXACILLIN <=0.25 SENSITIVE Sensitive     TETRACYCLINE <=1  SENSITIVE Sensitive     VANCOMYCIN <=0.5 SENSITIVE Sensitive     TRIMETH/SULFA <=10 SENSITIVE Sensitive     CLINDAMYCIN <=0.25 SENSITIVE Sensitive     RIFAMPIN <=0.5 SENSITIVE Sensitive     Inducible Clindamycin NEGATIVE Sensitive     * >=100,000 COLONIES/mL STAPHYLOCOCCUS AUREUS  Respiratory Panel by RT PCR (Flu A&B, Covid) - Nasopharyngeal Swab     Status: None   Collection Time: 04/17/20  1:56 PM   Specimen: Nasopharyngeal Swab  Result Value Ref Range Status   SARS Coronavirus 2 by RT PCR NEGATIVE NEGATIVE Final    Comment: (NOTE) SARS-CoV-2 target nucleic acids are NOT DETECTED. The SARS-CoV-2 RNA is generally detectable in upper respiratoy specimens during the acute phase of infection. The lowest concentration of SARS-CoV-2 viral copies this assay can detect is 131 copies/mL. A negative result does not preclude SARS-Cov-2 infection and should not be used as the sole basis for treatment or other patient management decisions. A negative result may occur with  improper specimen collection/handling, submission of specimen other than nasopharyngeal swab, presence of viral mutation(s) within the areas targeted by this assay, and inadequate number of viral copies (<131 copies/mL). A negative result must be combined with clinical observations, patient history, and epidemiological information. The expected result is Negative. Fact Sheet for Patients:  https://www.moore.com/ Fact Sheet for Healthcare Providers:  https://www.young.biz/ This test is not yet ap proved or cleared by the [place] FDA and  has been authorized for detection and/or diagnosis of SARS-CoV-2 by FDA under an Emergency Use Authorization (EUA). This EUA will remain  in effect (meaning this test can be used) for the duration of the COVID-19 declaration under Section 564(b)(1) of the Act, 21 U.S.C. section 360bbb-3(b)(1), unless the authorization is terminated or revoked  sooner.    Influenza A by PCR NEGATIVE NEGATIVE Final   Influenza B by PCR NEGATIVE NEGATIVE Final  Comment: (NOTE) The Xpert Xpress SARS-CoV-2/FLU/RSV assay is intended as an aid in  the diagnosis of influenza from Nasopharyngeal swab specimens and  should not be used as a sole basis for treatment. Nasal washings and  aspirates are unacceptable for Xpert Xpress SARS-CoV-2/FLU/RSV  testing. Fact Sheet for Patients: https://www.moore.com/ Fact Sheet for Healthcare Providers: https://www.young.biz/ This test is not yet approved or cleared by the [place] FDA and  has been authorized for detection and/or diagnosis of SARS-CoV-2 by  FDA under an Emergency Use Authorization (EUA). This EUA will remain  in effect (meaning this test can be used) for the duration of the  Covid-19 declaration under Section 564(b)(1) of the Act, 21  U.S.C. section 360bbb-3(b)(1), unless the authorization is  terminated or revoked. Performed at [facility], [address], [place], [place] [address]   Surgical pcr screen     Status: Abnormal   Collection Time: 04/17/20 11:11 PM   Specimen: Nasal Mucosa; Nasal Swab  Result Value Ref Range Status   MRSA, PCR NEGATIVE NEGATIVE Final   Staphylococcus aureus POSITIVE (A) NEGATIVE Final    Comment: (NOTE) The Xpert SA Assay (FDA approved for NASAL specimens in patients 50 years of age and older), is one component of a comprehensive surveillance program. It is not intended to diagnose infection nor to guide or monitor treatment. Performed at [facility], [address] [place], [place] [address]     Procedures and diagnostic studies:  DG Abd 1 View  Result Date: 04/20/2020 CLINICAL DATA:  Vomiting.  History of pancreatitis EXAM: ABDOMEN - 1 VIEW COMPARISON:  April 08, 2010 FINDINGS: There are loops of borderline distended small bowel in the mid abdomen. No appreciable air-fluid levels. No free air.  There is moderate gastric distension of the stomach. There are surgical clips in the right upper abdomen. Moderate stool noted in the colon. IMPRESSION: Loops of borderline dilated small bowel in the mid abdomen. This appearance raises question of a degree of ileus or enteritis. Bowel obstruction felt to be less likely. No evident free air. Surgical clips right upper abdomen. Electronically Signed   By: Bretta Bang III M.D.   On: 04/20/2020 10:14   DG Abd Portable 1V  Result Date: 04/20/2020 CLINICAL DATA:  69 year old male status post nasogastric tube placement. EXAM: PORTABLE ABDOMEN - 1 VIEW COMPARISON:  Abdominal radiograph 04/20/2020. FINDINGS: New nasogastric tube in position with tip in the body of the stomach. Previously noted gastric distension of the stomach has resolved. There continues to be widespread gaseous distension throughout the small bowel, with small bowel loops measuring up to 4.3 cm in diameter in the left upper quadrant. Gas and stool are also noted throughout the colon and distal rectum. No gross evidence of pneumoperitoneum on this single supine view. IMPRESSION: 1. New nasogastric tube with tip in the body of the stomach with resolution of previously noted gaseous gastric distension. 2. Persistently abnormal bowel gas pattern which may represent a small bowel ileus, although the possibility of partial small bowel obstruction is not excluded. Electronically Signed   By: Trudie Reed M.D.   On: 04/20/2020 11:39    Medications:   . Chlorhexidine Gluconate Cloth  6 each Topical Daily  . enoxaparin (LOVENOX) injection  40 mg Subcutaneous Q24H  . Gerhardt's butt cream   Topical TID  . mupirocin ointment  1 application Nasal BID  . senna-docusate  1 tablet Oral BID  . sodium chloride flush  3 mL Intravenous  Q12H   Continuous Infusions: . ampicillin-sulbactam (UNASYN) IV 3 g (04/21/20 0834)  . lactated ringers 125 mL/hr at 04/21/20 0700  . lactated ringers Stopped  (04/18/20 1046)     LOS: 4 days   Gwenyth Bender NP [facility]   How to contact the [facility] Attending or Consulting provider 7A - 7P or covering provider during after hours 7P -7A, for this patient?  1. Check the care team in [facility] and look for a) attending/consulting [facility] provider listed and b) the [facility] team listed 2. Log into [email or web address] and use [facility]'s universal password to access. If you do not have the password, please contact the hospital operator. 3. Locate the [facility] provider you are looking for under [facility] and page to a number that you can be directly reached. 4. If you still have difficulty reaching the provider, please page the [facility] (Director on Call) for the Hospitalists listed on amion for assistance.  04/21/2020, 10:13 AM

## 2020-04-21 NOTE — Progress Notes (Signed)
3 Days Post-Op  Subjective: CC:  Reports that he is doing better today.  Reports after NG tube placement that his epigastric abdominal pressure slowly subsided until it completely resolved at midnight.  No further abdominal pain or pressure.  He no longer has any nausea.  He is not passing any flatus.  No BM.  Objective: Vital signs in last 24 hours: Temp:  [97.4 F (36.3 C)-97.8 F (36.6 C)] 97.4 F (36.3 C) (04/30 0910) Pulse Rate:  [91-103] 91 (04/30 0910) Resp:  [16-18] 18 (04/30 0910) BP: (123-145)/(70-81) 123/70 (04/30 0910) SpO2:  [92 %-94 %] 92 % (04/30 0910) Last BM Date: 04/17/20  Intake/Output from previous day: 04/29 0701 - 04/30 0700 In: 3117.8 [P.O.:120; I.V.:2597.8; IV Piggyback:400] Out: 3217 [Urine:351; Emesis/NG output:2700; Drains:165; Stool:1] Intake/Output this shift: No intake/output data recorded.  PE: Gen:  Alert, NAD, pleasant HEENT: NG tube in place.  2.7 L / 24 hours-bilious output Abd: Soft, mild distention.  NT and without rigidity or guarding.  Hypoactive bowel sounds.  Incisions with glue intact appears well and are without drainage, bleeding, or signs of infection. Drain SS w/ 165cc/24 hours.  Ext:  No LE edema Skin: no rashes noted, warm and dry  Lab Results:  Recent Labs    04/20/20 0520 04/21/20 0625  WBC 11.7* 14.2*  HGB 13.1 12.9*  HCT 40.5 40.4  PLT 217 230   BMET Recent Labs    04/20/20 0520 04/21/20 0625  NA 147* 148*  K 4.1 3.6  CL 107 108  CO2 28 27  GLUCOSE 108* 92  BUN 18 21  CREATININE 0.93 0.90  CALCIUM 9.8 9.3   PT/INR No results for input(s): LABPROT, INR in the last 72 hours. CMP     Component Value Date/Time   NA 148 (H) 04/21/2020 0625   NA 137 02/01/2020 0000   K 3.6 04/21/2020 0625   CL 108 04/21/2020 0625   CO2 27 04/21/2020 0625   GLUCOSE 92 04/21/2020 0625   BUN 21 04/21/2020 0625   BUN 20 02/01/2020 0000   CREATININE 0.90 04/21/2020 0625   CALCIUM 9.3 04/21/2020 0625   PROT 5.5 (L)  04/21/2020 0625   PROT 7.3 03/08/2020 1628   ALBUMIN 2.4 (L) 04/21/2020 0625   ALBUMIN 4.4 03/08/2020 1628   AST 21 04/21/2020 0625   ALT 30 04/21/2020 0625   ALKPHOS 117 04/21/2020 0625   BILITOT 0.9 04/21/2020 0625   BILITOT 0.4 03/08/2020 1628   GFRNONAA >60 04/21/2020 0625   GFRAA >60 04/21/2020 0625   Lipase     Component Value Date/Time   LIPASE 20 04/21/2020 0625       Studies/Results: DG Abd 1 View  Result Date: 04/20/2020 CLINICAL DATA:  Vomiting.  History of pancreatitis EXAM: ABDOMEN - 1 VIEW COMPARISON:  April 08, 2010 FINDINGS: There are loops of borderline distended small bowel in the mid abdomen. No appreciable air-fluid levels. No free air. There is moderate gastric distension of the stomach. There are surgical clips in the right upper abdomen. Moderate stool noted in the colon. IMPRESSION: Loops of borderline dilated small bowel in the mid abdomen. This appearance raises question of a degree of ileus or enteritis. Bowel obstruction felt to be less likely. No evident free air. Surgical clips right upper abdomen. Electronically Signed   By: Bretta Bang III M.D.   On: 04/20/2020 10:14   DG Abd Portable 1V  Result Date: 04/20/2020 CLINICAL DATA:  69 year old male status post nasogastric tube  placement. EXAM: PORTABLE ABDOMEN - 1 VIEW COMPARISON:  Abdominal radiograph 04/20/2020. FINDINGS: New nasogastric tube in position with tip in the body of the stomach. Previously noted gastric distension of the stomach has resolved. There continues to be widespread gaseous distension throughout the small bowel, with small bowel loops measuring up to 4.3 cm in diameter in the left upper quadrant. Gas and stool are also noted throughout the colon and distal rectum. No gross evidence of pneumoperitoneum on this single supine view. IMPRESSION: 1. New nasogastric tube with tip in the body of the stomach with resolution of previously noted gaseous gastric distension. 2. Persistently  abnormal bowel gas pattern which may represent a small bowel ileus, although the possibility of partial small bowel obstruction is not excluded. Electronically Signed   By: Vinnie Langton M.D.   On: 04/20/2020 11:39    Anti-infectives: Anti-infectives (From admission, onward)   Start     Dose/Rate Route Frequency Ordered Stop   04/20/20 0800  Ampicillin-Sulbactam (UNASYN) 3 g in sodium chloride 0.9 % 100 mL IVPB     3 g 200 mL/hr over 30 Minutes Intravenous Every 6 hours 04/19/20 1118 04/23/20 2359   04/19/20 1130  Ampicillin-Sulbactam (UNASYN) 3 g in sodium chloride 0.9 % 100 mL IVPB  Status:  Discontinued     3 g 200 mL/hr over 30 Minutes Intravenous Every 6 hours 04/19/20 1117 04/19/20 1118   04/18/20 1000  cefTRIAXone (ROCEPHIN) 2 g in sodium chloride 0.9 % 100 mL IVPB  Status:  Discontinued     2 g 200 mL/hr over 30 Minutes Intravenous Every 24 hours 04/17/20 1437 04/19/20 1117   04/18/20 1000  azithromycin (ZITHROMAX) 500 mg in sodium chloride 0.9 % 250 mL IVPB  Status:  Discontinued     500 mg 250 mL/hr over 60 Minutes Intravenous Every 24 hours 04/17/20 1437 04/19/20 1117   04/17/20 1145  cefTRIAXone (ROCEPHIN) 1 g in sodium chloride 0.9 % 100 mL IVPB     1 g 200 mL/hr over 30 Minutes Intravenous  Once 04/17/20 1141 04/17/20 1415   04/17/20 1145  azithromycin (ZITHROMAX) 500 mg in sodium chloride 0.9 % 250 mL IVPB     500 mg 250 mL/hr over 60 Minutes Intravenous  Once 04/17/20 1141 04/17/20 1902       Assessment/Plan 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 Ninfa Linden, 04/18/2020, POD #3 - Ileus. Continue NGT on LIWS - Maintain Drain. No bilious output. No concern for bile leak at this time.  - LFT and T bili wnl - will hold off on MRCP - Lipase wnl - Path with Chronic cholecystitis with multinucleated giant cells  and Cholelithiasis.  No evidence of malignancy.   FEN -N.p.o., IV fluids, NG tube VTE -SCDs, Lovenox ID -Rocephin/Azithromycin4/26 - 4/28. Unasyn 4/28 (for asp PNA - Per TRH) >> WBC 14.2 Foley - chronic indwelling  Follow-Up - TBD   LOS: 4 days    Jillyn Ledger , [facility] 04/21/2020, 10:33 AM Please see Amion for pager number during day hours 7:00am-4:30pm

## 2020-04-21 NOTE — Plan of Care (Signed)
  Problem: Coping: Goal: Level of anxiety will decrease Outcome: Progressing   Problem: Skin Integrity: Goal: Risk for impaired skin integrity will decrease Outcome: Progressing   Problem: Urinary Elimination: Goal: Signs and symptoms of infection will decrease Outcome: Progressing

## 2020-04-22 ENCOUNTER — Inpatient Hospital Stay (HOSPITAL_COMMUNITY): Payer: Medicare Other

## 2020-04-22 DIAGNOSIS — K851 Biliary acute pancreatitis without necrosis or infection: Secondary | ICD-10-CM | POA: Diagnosis not present

## 2020-04-22 LAB — COMPREHENSIVE METABOLIC PANEL
ALT: 26 U/L (ref 0–44)
AST: 22 U/L (ref 15–41)
Albumin: 2.5 g/dL — ABNORMAL LOW (ref 3.5–5.0)
Alkaline Phosphatase: 105 U/L (ref 38–126)
Anion gap: 11 (ref 5–15)
BUN: 19 mg/dL (ref 8–23)
CO2: 37 mmol/L — ABNORMAL HIGH (ref 22–32)
Calcium: 9.4 mg/dL (ref 8.9–10.3)
Chloride: 105 mmol/L (ref 98–111)
Creatinine, Ser: 0.98 mg/dL (ref 0.61–1.24)
GFR calc Af Amer: >60 mL/min (ref >60)
GFR calc non Af Amer: >60 mL/min (ref >60)
Glucose, Bld: 82 mg/dL (ref 70–99)
Potassium: 3.1 mmol/L — ABNORMAL LOW (ref 3.5–5.1)
Sodium: 153 mmol/L — ABNORMAL HIGH (ref 135–145)
Total Bilirubin: 0.6 mg/dL (ref 0.3–1.2)
Total Protein: 5.6 g/dL — ABNORMAL LOW (ref 6.5–8.1)

## 2020-04-22 LAB — CBC
HCT: 38.7 % — ABNORMAL LOW (ref 39.0–52.0)
Hemoglobin: 12.4 g/dL — ABNORMAL LOW (ref 13.0–17.0)
MCH: 29.9 pg (ref 26.0–34.0)
MCHC: 32 g/dL (ref 30.0–36.0)
MCV: 93.3 fL (ref 80.0–100.0)
Platelets: 239 10*3/uL (ref 150–400)
RBC: 4.15 MIL/uL — ABNORMAL LOW (ref 4.22–5.81)
RDW: 16.4 % — ABNORMAL HIGH (ref 11.5–15.5)
WBC: 10.8 10*3/uL — ABNORMAL HIGH (ref 4.0–10.5)
nRBC: 0 % (ref 0.0–0.2)

## 2020-04-22 NOTE — Plan of Care (Signed)
  Problem: Health Behavior/Discharge Planning: Goal: Ability to manage health-related needs will improve Outcome: Progressing   Problem: Pain Managment: Goal: General experience of comfort will improve Outcome: Progressing   Problem: Skin Integrity: Goal: Risk for impaired skin integrity will decrease Outcome: Progressing   Problem: Urinary Elimination: Goal: Signs and symptoms of infection will decrease Outcome: Progressing   Problem: Safety: Goal: Ability to remain free from injury will improve Outcome: Completed/Met

## 2020-04-22 NOTE — Progress Notes (Signed)
PROGRESS NOTE    Stephen Wade  GYJ:[id number] DOB: Jul 10, 1951 DOA: 04/17/2020 PCP: Wanda Plump, MD   Brief Narrative:  Stephen Wade is an 69 y.o. male with a past medical history that includes severe MS bedbound, dysphagia, chronic indwelling Foley catheter, cholelithiasis, paroxysmal atrial fibrillation not on anticoagulation, GERD who was admitted April 26 with acute biliary pancreatitis status post laparoscopic cholecystectomy on April 27.  Patient had persistent nausea with vomiting x-ray concerning for an ileus and an NG tube was placed on April 29.  Assessment & Plan:   Principal Problem:   Pancreatitis Active Problems:   URINARY CALCULUS   Spastic gait   Multiple sclerosis (HCC)   Pressure injury of skin   Chronic indwelling Foley catheter   Transaminitis   Dysphagia   Neurogenic bladder   Palliative care patient   Hypercalcemia   Acute biliary pancreatitis: Lipase slightly elevated but CT confirms pancreatitis. Cholelithiasis but no cholecystitis. General surgery on board. Status post laparoscopic cholecystectomy by Dr. Magnus Ivan.  Developed ileus postoperatively.  NG tube placed.  Suctioning well.  On ice chips.  Passing some gas but no bowel movement.  He is adamant that he should be allowed to have liquids.  I spent significant length of time trying to explain to him that it might not be a good idea while there is still significant amount of suction.  Although not happy but he was ok with that.  Continue with n.p.o. and ice chips.  General surgery repeating x-ray today.  Appreciate their help.  History of dysphagia and aspiration pneumonia:  Imaging studies concerning for airspace opacities in the bilateral lower lungs right worse than left.  At baseline, patient declines being on a modified diet despite of understanding the risk of aspiration.  Continue Unasyn for another day.  Chronic indwelling Foley catheter, possible UTI vs. asymptomatic bacteriuria: Acute on  chronic.  Patient has chronic indwelling Foley catheter due to neurogenic bladder with prior history of recurrent UTIs.  His wife has noted increased sediment in his urine.  She has been flushing the catheter twice daily with water and vinegar solution.  He has also been having the catheter changed out weekly. -Continue twice daily for Foley catheter flushes -Antibiotics as seen above  Hypercalcemia: Acute. Resolved.  Elevated LFTs: Liver enzymes mildly elevated including alkaline phosphatase 197, AST 59, and ALT 67.  This has resolved.  Suspect secondary to gallstone.  Multiple sclerosis with spastic gait: Patient is significantly debilitated and known to be wheelchair/bedbound at baseline.  Home medications include baclofen 10 mg 3 times daily as needed for muscle spasms and Teriflunomide 14 mg daily. -Continue home medication regimen  Pressure ulcer of the sacrum. poa: Acute on chronic.  Stage II pressure ulcer of the sacrum. -Low-air-loss bed replacement -Wound care consult  Paroxysmal atrial fibrillation: Patient currently in sinus rhythm.CHA2DS2-VASc score = 1. -Continue to monitor. Not on any anticoagulation.  Palliative care patient: On patient's chart notes that he is on hospice, but wife reports that he is under palliative care.  Nephrolithiasis, bladder calculi: Incidental finding seen on CT that are nonobstructing.  DVT prophylaxis: Lovenox   Code Status: Full Code  Family Communication:  None present at bedside.  Spoke to wife at the bedside yesterday. Patient is from: Home Disposition Plan: Likely home in next 2 to 3 days. Barriers to discharge: Persistent postoperative ileus.  Status is: Inpatient  Remains inpatient appropriate because:IV treatments appropriate due to intensity of illness or inability to  take PO   Dispo: The patient is from: Home              Anticipated d/c is to: Home              Anticipated d/c date is: 2 days              Patient  currently is not medically stable to d/c.         Estimated body mass index is 26.46 kg/m as calculated from the following:   Height as of this encounter: 6' (1.829 m).   Weight as of this encounter: 88.5 kg.      Nutritional status:               Consultants:   General surgery  Procedures:   Laparoscopic cholecystectomy 04/18/2020  Antimicrobials:  Anti-infectives (From admission, onward)   Start     Dose/Rate Route Frequency Ordered Stop   04/20/20 0800  Ampicillin-Sulbactam (UNASYN) 3 g in sodium chloride 0.9 % 100 mL IVPB     3 g 200 mL/hr over 30 Minutes Intravenous Every 6 hours 04/19/20 1118 04/23/20 2359   04/19/20 1130  Ampicillin-Sulbactam (UNASYN) 3 g in sodium chloride 0.9 % 100 mL IVPB  Status:  Discontinued     3 g 200 mL/hr over 30 Minutes Intravenous Every 6 hours 04/19/20 1117 04/19/20 1118   04/18/20 1000  cefTRIAXone (ROCEPHIN) 2 g in sodium chloride 0.9 % 100 mL IVPB  Status:  Discontinued     2 g 200 mL/hr over 30 Minutes Intravenous Every 24 hours 04/17/20 1437 04/19/20 1117   04/18/20 1000  azithromycin (ZITHROMAX) 500 mg in sodium chloride 0.9 % 250 mL IVPB  Status:  Discontinued     500 mg 250 mL/hr over 60 Minutes Intravenous Every 24 hours 04/17/20 1437 04/19/20 1117   04/17/20 1145  cefTRIAXone (ROCEPHIN) 1 g in sodium chloride 0.9 % 100 mL IVPB     1 g 200 mL/hr over 30 Minutes Intravenous  Once 04/17/20 1141 04/17/20 1415   04/17/20 1145  azithromycin (ZITHROMAX) 500 mg in sodium chloride 0.9 % 250 mL IVPB     500 mg 250 mL/hr over 60 Minutes Intravenous  Once 04/17/20 1141 04/17/20 1902         Subjective: Patient seen and examined.  Feels better.  No complaint.  No nausea or abdominal pain.  Passing some gas but no bowel movement.  Adamant that he should be allowed to have liquid diet.  Full discussion as mentioned above.  Objective: Vitals:   04/21/20 1654 04/21/20 2255 04/22/20 0700 04/22/20 1024  BP: 127/72 (!)  142/78 133/73 128/74  Pulse: 88 87 97 96  Resp: 20 16 15 16   Temp: (!) 97.4 F (36.3 C) (!) 97.5 F (36.4 C) (!) 97.4 F (36.3 C) 97.8 F (36.6 C)  TempSrc: Oral Oral Oral Oral  SpO2: 96% 96% 96% 98%  Weight:      Height:        Intake/Output Summary (Last 24 hours) at 04/22/2020 1059 Last data filed at 04/22/2020 0900 Gross per 24 hour  Intake 2242.23 ml  Output 4085 ml  Net -1842.77 ml   Filed Weights   04/17/20 0726  Weight: 88.5 kg    Examination:  General exam: Appears calm and comfortable, NG tube in place Respiratory system: Clear to auscultation. Respiratory effort normal. Cardiovascular system: S1 & S2 heard, RRR. No JVD, murmurs, rubs, gallops or clicks. No pedal edema. Gastrointestinal  system: Abdomen is nondistended, soft and nontender. No organomegaly or masses felt.  Diminished bowel sounds heard. Central nervous system: Alert and oriented. No focal neurological deficits. Skin: No rashes, lesions or ulcers.  Psychiatry: Judgement and insight appear normal. Mood & affect appropriate.   Data Reviewed: I have personally reviewed following labs and imaging studies  CBC: Recent Labs  Lab 04/18/20 0514 04/19/20 0434 04/20/20 0520 04/21/20 0625 04/22/20 0444  WBC 5.7 11.6* 11.7* 14.2* 10.8*  HGB 12.4* 12.8* 13.1 12.9* 12.4*  HCT 38.8* 39.6 40.5 40.4 38.7*  MCV 90.7 91.2 92.5 91.8 93.3  PLT 199 227 217 230 239   Basic Metabolic Panel: Recent Labs  Lab 04/18/20 0514 04/19/20 0434 04/20/20 0520 04/21/20 0625 04/22/20 0444  NA 145 146* 147* 148* 153*  K 4.1 4.5 4.1 3.6 3.1*  CL 112* 108 107 108 105  CO2 25 25 28 27  37*  GLUCOSE 69* 100* 108* 92 82  BUN 16 19 18 21 19   CREATININE 0.82 0.96 0.93 0.90 0.98  CALCIUM 9.9 10.1 9.8 9.3 9.4   GFR: Estimated Creatinine Clearance: 80.3 mL/min (by C-G formula based on SCr of 0.98 mg/dL). Liver Function Tests: Recent Labs  Lab 04/18/20 0514 04/19/20 0434 04/20/20 0520 04/21/20 0625 04/22/20 0444  AST  60* 60* 46* 21 22  ALT 63* 59* 44 30 26  ALKPHOS 173* 158* 140* 117 105  BILITOT 0.7 0.6 1.4* 0.9 0.6  PROT 6.2* 6.6 6.2* 5.5* 5.6*  ALBUMIN 3.1* 3.2* 3.0* 2.4* 2.5*   Recent Labs  Lab 04/17/20 0733 04/18/20 0514 04/21/20 0625  LIPASE 68* 49 20   No results for input(s): AMMONIA in the last 168 hours. Coagulation Profile: No results for input(s): INR, PROTIME in the last 168 hours. Cardiac Enzymes: No results for input(s): CKTOTAL, CKMB, CKMBINDEX, TROPONINI in the last 168 hours. BNP (last 3 results) No results for input(s): PROBNP in the last 8760 hours. HbA1C: No results for input(s): HGBA1C in the last 72 hours. CBG: No results for input(s): GLUCAP in the last 168 hours. Lipid Profile: No results for input(s): CHOL, HDL, LDLCALC, TRIG, CHOLHDL, LDLDIRECT in the last 72 hours. Thyroid Function Tests: No results for input(s): TSH, T4TOTAL, FREET4, T3FREE, THYROIDAB in the last 72 hours. Anemia Panel: No results for input(s): VITAMINB12, FOLATE, FERRITIN, TIBC, IRON, RETICCTPCT in the last 72 hours. Sepsis Labs: No results for input(s): PROCALCITON, LATICACIDVEN in the last 168 hours.  Recent Results (from the past 240 hour(s))  Urine Culture     Status: Abnormal   Collection Time: 04/17/20 11:42 AM   Specimen: Urine, Catheterized  Result Value Ref Range Status   Specimen Description URINE, CATHETERIZED  Final   Special Requests   Final    NONE Performed at [facility], [address] [place], [address] [place]    Culture (A)  Final    >=100,000 COLONIES/mL STAPHYLOCOCCUS AUREUS 20,000 COLONIES/mL ENTEROCOCCUS FAECALIS VANCOMYCIN RESISTANT ENTEROCOCCUS ISOLATED    Report Status 04/20/2020 FINAL  Final   Organism ID, Bacteria STAPHYLOCOCCUS AUREUS (A)  Final   Organism ID, Bacteria ENTEROCOCCUS FAECALIS (A)  Final      Susceptibility   Enterococcus faecalis - MIC*    AMPICILLIN <=2 SENSITIVE Sensitive     NITROFURANTOIN <=16 SENSITIVE Sensitive      VANCOMYCIN >=32 RESISTANT Resistant     LINEZOLID 2 SENSITIVE Sensitive     * 20,000 COLONIES/mL ENTEROCOCCUS FAECALIS   Staphylococcus aureus - MIC*    CIPROFLOXACIN >=8 RESISTANT  Resistant     GENTAMICIN <=0.5 SENSITIVE Sensitive     NITROFURANTOIN <=16 SENSITIVE Sensitive     OXACILLIN <=0.25 SENSITIVE Sensitive     TETRACYCLINE <=1 SENSITIVE Sensitive     VANCOMYCIN <=0.5 SENSITIVE Sensitive     TRIMETH/SULFA <=10 SENSITIVE Sensitive     CLINDAMYCIN <=0.25 SENSITIVE Sensitive     RIFAMPIN <=0.5 SENSITIVE Sensitive     Inducible Clindamycin NEGATIVE Sensitive     * >=100,000 COLONIES/mL STAPHYLOCOCCUS AUREUS  Respiratory Panel by RT PCR (Flu A&B, Covid) - Nasopharyngeal Swab     Status: None   Collection Time: 04/17/20  1:56 PM   Specimen: Nasopharyngeal Swab  Result Value Ref Range Status   SARS Coronavirus 2 by RT PCR NEGATIVE NEGATIVE Final    Comment: (NOTE) SARS-CoV-2 target nucleic acids are NOT DETECTED. The SARS-CoV-2 RNA is generally detectable in upper respiratoy specimens during the acute phase of infection. The lowest concentration of SARS-CoV-2 viral copies this assay can detect is 131 copies/mL. A negative result does not preclude SARS-Cov-2 infection and should not be used as the sole basis for treatment or other patient management decisions. A negative result may occur with  improper specimen collection/handling, submission of specimen other than nasopharyngeal swab, presence of viral mutation(s) within the areas targeted by this assay, and inadequate number of viral copies (<131 copies/mL). A negative result must be combined with clinical observations, patient history, and epidemiological information. The expected result is Negative. Fact Sheet for Patients:  https://www.moore.com/ Fact Sheet for Healthcare Providers:  https://www.young.biz/ This test is not yet ap proved or cleared by the [place] FDA and  has been  authorized for detection and/or diagnosis of SARS-CoV-2 by FDA under an Emergency Use Authorization (EUA). This EUA will remain  in effect (meaning this test can be used) for the duration of the COVID-19 declaration under Section 564(b)(1) of the Act, 21 U.S.C. section 360bbb-3(b)(1), unless the authorization is terminated or revoked sooner.    Influenza A by PCR NEGATIVE NEGATIVE Final   Influenza B by PCR NEGATIVE NEGATIVE Final    Comment: (NOTE) The Xpert Xpress SARS-CoV-2/FLU/RSV assay is intended as an aid in  the diagnosis of influenza from Nasopharyngeal swab specimens and  should not be used as a sole basis for treatment. Nasal washings and  aspirates are unacceptable for Xpert Xpress SARS-CoV-2/FLU/RSV  testing. Fact Sheet for Patients: https://www.moore.com/ Fact Sheet for Healthcare Providers: https://www.young.biz/ This test is not yet approved or cleared by the [place] FDA and  has been authorized for detection and/or diagnosis of SARS-CoV-2 by  FDA under an Emergency Use Authorization (EUA). This EUA will remain  in effect (meaning this test can be used) for the duration of the  Covid-19 declaration under Section 564(b)(1) of the Act, 21  U.S.C. section 360bbb-3(b)(1), unless the authorization is  terminated or revoked. Performed at [facility], [address], [place], [place] [address]   Surgical pcr screen     Status: Abnormal   Collection Time: 04/17/20 11:11 PM   Specimen: Nasal Mucosa; Nasal Swab  Result Value Ref Range Status   MRSA, PCR NEGATIVE NEGATIVE Final   Staphylococcus aureus POSITIVE (A) NEGATIVE Final    Comment: (NOTE) The Xpert SA Assay (FDA approved for NASAL specimens in patients 5 years of age and older), is one component of a comprehensive surveillance program. It is not intended to diagnose infection nor to guide or monitor treatment. Performed at [facility], [address] [place], [place] [address]       Radiology Studies: DG CHEST PORT 1 VIEW  Result Date: 04/22/2020 CLINICAL DATA:  Follow-up right pleural effusion EXAM: PORTABLE CHEST 1 VIEW COMPARISON:  Chest radiograph from one day prior. FINDINGS: Enteric tube terminates in the proximal stomach with side port in the lower thoracic esophagus. Stable cardiomediastinal silhouette with normal heart size. No pneumothorax. Trace right pleural effusion appears decreased. No left pleural effusion. No pulmonary edema. Curvilinear right lung base opacities, slightly decreased. IMPRESSION: 1. Trace right pleural effusion, decreased. 2. Curvilinear right lung base opacities, slightly decreased, favor atelectasis. 3. Enteric tube terminates in the proximal stomach with side port in the lower thoracic esophagus, consider advancing 5 cm. Electronically Signed   By: Ilona Sorrel M.D.   On: 04/22/2020 07:13   DG CHEST PORT 1 VIEW  Result Date: 04/21/2020 CLINICAL DATA:  OG tube placement EXAM: PORTABLE CHEST 1 VIEW COMPARISON:  04/17/2020 FINDINGS: Interval placement of esophagogastric tube, tip at the gastroesophageal junction, side-port over the lower esophagus. New or increased right pleural effusion with associated atelectasis or consolidation. The left lung is normally aerated. IMPRESSION: 1. Interval placement of esophagogastric tube, tip at the gastroesophageal junction, side-port over the lower esophagus. Recommend advancement. 2. New or increased right pleural effusion with associated atelectasis or consolidation. The left lung is normally aerated. Electronically Signed   By: Eddie Candle M.D.   On: 04/21/2020 11:20   DG Abd Portable 1V  Result Date: 04/21/2020 CLINICAL DATA:  Small bowel dilatation EXAM: PORTABLE ABDOMEN - 1 VIEW COMPARISON:  April 20, 2020 FINDINGS: Nasogastric tube tip is in the proximal stomach with the side port at the gastroesophageal junction. There remain multiple loops of dilated small bowel  without appreciable air-fluid level. No free air evident on this supine examination. There are surgical clips in the right upper quadrant. IMPRESSION: Nasogastric tube tip in proximal body of stomach with side port at gastroesophageal junction. Advise advancing nasogastric tube 5-6 cm to insure that both tube tip and side port are well within the stomach. Persistent small bowel dilatation, likely due to ileus, although a degree bowel obstruction cannot be entirely excluded in this circumstance. Note that there is air in stool within the colon. No free air is appreciable on this supine examination. Electronically Signed   By: Lowella Grip III M.D.   On: 04/21/2020 10:36   DG Abd Portable 1V  Result Date: 04/20/2020 CLINICAL DATA:  69 year old male status post nasogastric tube placement. EXAM: PORTABLE ABDOMEN - 1 VIEW COMPARISON:  Abdominal radiograph 04/20/2020. FINDINGS: New nasogastric tube in position with tip in the body of the stomach. Previously noted gastric distension of the stomach has resolved. There continues to be widespread gaseous distension throughout the small bowel, with small bowel loops measuring up to 4.3 cm in diameter in the left upper quadrant. Gas and stool are also noted throughout the colon and distal rectum. No gross evidence of pneumoperitoneum on this single supine view. IMPRESSION: 1. New nasogastric tube with tip in the body of the stomach with resolution of previously noted gaseous gastric distension. 2. Persistently abnormal bowel gas pattern which may represent a small bowel ileus, although the possibility of partial small bowel obstruction is not excluded. Electronically Signed   By: Vinnie Langton M.D.   On: 04/20/2020 11:39    Scheduled Meds: . Chlorhexidine Gluconate Cloth  6 each Topical Daily  . enoxaparin (LOVENOX) injection  40 mg Subcutaneous Q24H  . Gerhardt's butt cream  Topical TID  . senna-docusate  1 tablet Oral BID  . sodium chloride flush  3 mL  Intravenous Q12H   Continuous Infusions: . ampicillin-sulbactam (UNASYN) IV 3 g (04/22/20 0829)  . lactated ringers 125 mL/hr at 04/21/20 2103  . lactated ringers Stopped (04/18/20 1046)     LOS: 5 days   Time spent: 30 minutes   Hughie Closs, MD [facility]  04/22/2020, 10:59 AM   To contact the attending provider between 7A-7P or the covering provider during after hours 7P-7A, please log into the web site [email or web address].

## 2020-04-22 NOTE — Progress Notes (Signed)
4 Days Post-Op   Subjective/Chief Complaint: Small amt flatus, still with abd pressure   Objective: Vital signs in last 24 hours: Temp:  [97.4 F (36.3 C)-97.5 F (36.4 C)] 97.4 F (36.3 C) (05/01 0700) Pulse Rate:  [87-97] 97 (05/01 0700) Resp:  [15-20] 15 (05/01 0700) BP: (127-142)/(72-78) 133/73 (05/01 0700) SpO2:  [96 %] 96 % (05/01 0700) Last BM Date: 04/17/20  Intake/Output from previous day: 04/30 0701 - 05/01 0700 In: 2242.2 [P.O.:240; I.V.:1702.2; IV Piggyback:300] Out: 3810 [Urine:900; Emesis/NG output:2850; Drains:60] Intake/Output this shift: No intake/output data recorded.  GI: mildly tender, incisoins clean, few bs mild distention  Lab Results:  Recent Labs    04/21/20 0625 04/22/20 0444  WBC 14.2* 10.8*  HGB 12.9* 12.4*  HCT 40.4 38.7*  PLT 230 239   BMET Recent Labs    04/21/20 0625 04/22/20 0444  NA 148* 153*  K 3.6 3.1*  CL 108 105  CO2 27 37*  GLUCOSE 92 82  BUN 21 19  CREATININE 0.90 0.98  CALCIUM 9.3 9.4   PT/INR No results for input(s): LABPROT, INR in the last 72 hours. ABG No results for input(s): PHART, HCO3 in the last 72 hours.  Invalid input(s): PCO2, PO2  Studies/Results: DG Abd 1 View  Result Date: 04/20/2020 CLINICAL DATA:  Vomiting.  History of pancreatitis EXAM: ABDOMEN - 1 VIEW COMPARISON:  April 08, 2010 FINDINGS: There are loops of borderline distended small bowel in the mid abdomen. No appreciable air-fluid levels. No free air. There is moderate gastric distension of the stomach. There are surgical clips in the right upper abdomen. Moderate stool noted in the colon. IMPRESSION: Loops of borderline dilated small bowel in the mid abdomen. This appearance raises question of a degree of ileus or enteritis. Bowel obstruction felt to be less likely. No evident free air. Surgical clips right upper abdomen. Electronically Signed   By: Bretta Bang III M.D.   On: 04/20/2020 10:14   DG CHEST PORT 1 VIEW  Result Date:  04/22/2020 CLINICAL DATA:  Follow-up right pleural effusion EXAM: PORTABLE CHEST 1 VIEW COMPARISON:  Chest radiograph from one day prior. FINDINGS: Enteric tube terminates in the proximal stomach with side port in the lower thoracic esophagus. Stable cardiomediastinal silhouette with normal heart size. No pneumothorax. Trace right pleural effusion appears decreased. No left pleural effusion. No pulmonary edema. Curvilinear right lung base opacities, slightly decreased. IMPRESSION: 1. Trace right pleural effusion, decreased. 2. Curvilinear right lung base opacities, slightly decreased, favor atelectasis. 3. Enteric tube terminates in the proximal stomach with side port in the lower thoracic esophagus, consider advancing 5 cm. Electronically Signed   By: Delbert Phenix M.D.   On: 04/22/2020 07:13   DG CHEST PORT 1 VIEW  Result Date: 04/21/2020 CLINICAL DATA:  OG tube placement EXAM: PORTABLE CHEST 1 VIEW COMPARISON:  04/17/2020 FINDINGS: Interval placement of esophagogastric tube, tip at the gastroesophageal junction, side-port over the lower esophagus. New or increased right pleural effusion with associated atelectasis or consolidation. The left lung is normally aerated. IMPRESSION: 1. Interval placement of esophagogastric tube, tip at the gastroesophageal junction, side-port over the lower esophagus. Recommend advancement. 2. New or increased right pleural effusion with associated atelectasis or consolidation. The left lung is normally aerated. Electronically Signed   By: Lauralyn Primes M.D.   On: 04/21/2020 11:20   DG Abd Portable 1V  Result Date: 04/21/2020 CLINICAL DATA:  Small bowel dilatation EXAM: PORTABLE ABDOMEN - 1 VIEW COMPARISON:  April 20, 2020 FINDINGS:  Nasogastric tube tip is in the proximal stomach with the side port at the gastroesophageal junction. There remain multiple loops of dilated small bowel without appreciable air-fluid level. No free air evident on this supine examination. There are  surgical clips in the right upper quadrant. IMPRESSION: Nasogastric tube tip in proximal body of stomach with side port at gastroesophageal junction. Advise advancing nasogastric tube 5-6 cm to insure that both tube tip and side port are well within the stomach. Persistent small bowel dilatation, likely due to ileus, although a degree bowel obstruction cannot be entirely excluded in this circumstance. Note that there is air in stool within the colon. No free air is appreciable on this supine examination. Electronically Signed   By: Bretta Bang III M.D.   On: 04/21/2020 10:36   DG Abd Portable 1V  Result Date: 04/20/2020 CLINICAL DATA:  69 year old male status post nasogastric tube placement. EXAM: PORTABLE ABDOMEN - 1 VIEW COMPARISON:  Abdominal radiograph 04/20/2020. FINDINGS: New nasogastric tube in position with tip in the body of the stomach. Previously noted gastric distension of the stomach has resolved. There continues to be widespread gaseous distension throughout the small bowel, with small bowel loops measuring up to 4.3 cm in diameter in the left upper quadrant. Gas and stool are also noted throughout the colon and distal rectum. No gross evidence of pneumoperitoneum on this single supine view. IMPRESSION: 1. New nasogastric tube with tip in the body of the stomach with resolution of previously noted gaseous gastric distension. 2. Persistently abnormal bowel gas pattern which may represent a small bowel ileus, although the possibility of partial small bowel obstruction is not excluded. Electronically Signed   By: Trudie Reed M.D.   On: 04/20/2020 11:39    Anti-infectives: Anti-infectives (From admission, onward)   Start     Dose/Rate Route Frequency Ordered Stop   04/20/20 0800  Ampicillin-Sulbactam (UNASYN) 3 g in sodium chloride 0.9 % 100 mL IVPB     3 g 200 mL/hr over 30 Minutes Intravenous Every 6 hours 04/19/20 1118 04/23/20 2359   04/19/20 1130  Ampicillin-Sulbactam (UNASYN)  3 g in sodium chloride 0.9 % 100 mL IVPB  Status:  Discontinued     3 g 200 mL/hr over 30 Minutes Intravenous Every 6 hours 04/19/20 1117 04/19/20 1118   04/18/20 1000  cefTRIAXone (ROCEPHIN) 2 g in sodium chloride 0.9 % 100 mL IVPB  Status:  Discontinued     2 g 200 mL/hr over 30 Minutes Intravenous Every 24 hours 04/17/20 1437 04/19/20 1117   04/18/20 1000  azithromycin (ZITHROMAX) 500 mg in sodium chloride 0.9 % 250 mL IVPB  Status:  Discontinued     500 mg 250 mL/hr over 60 Minutes Intravenous Every 24 hours 04/17/20 1437 04/19/20 1117   04/17/20 1145  cefTRIAXone (ROCEPHIN) 1 g in sodium chloride 0.9 % 100 mL IVPB     1 g 200 mL/hr over 30 Minutes Intravenous  Once 04/17/20 1141 04/17/20 1415   04/17/20 1145  azithromycin (ZITHROMAX) 500 mg in sodium chloride 0.9 % 250 mL IVPB     500 mg 250 mL/hr over 60 Minutes Intravenous  Once 04/17/20 1141 04/17/20 1902      Assessment/Plan: MS Stenosis of right common iliac noted on CT UTI in setting of chronic indwelling Foley Aspiration PNA - Above per TRH -   Biliary Pancreatitis Acute on Chronic Cholecystitis  S/p Lap Chole, Dr. Magnus Ivan, 04/18/2020, POD #4 - Ileus. Continue NGT on LIWS - Maintain drain. No  bilious output. No concern for bile leak at this time.  - LFT and T bili wnl - will hold off on MRCP FEN -ice chips., IV fluids, NG tube will check xray again, hopefully out tomorrow VTE -SCDs, Lovenox ID -Rocephin/Azithromycin4/26- 4/28. Unasyn 4/28 (for asp PNA - Per [facility])>> WBC 14.2 Foley-chronicindwelling  Follow-Up - TBD   Rolm Bookbinder 04/22/2020

## 2020-04-23 DIAGNOSIS — K851 Biliary acute pancreatitis without necrosis or infection: Secondary | ICD-10-CM | POA: Diagnosis not present

## 2020-04-23 LAB — CBC WITH DIFFERENTIAL/PLATELET
Abs Immature Granulocytes: 0.04 10*3/uL (ref 0.00–0.07)
Basophils Absolute: 0 10*3/uL (ref 0.0–0.1)
Basophils Relative: 1 %
Eosinophils Absolute: 0.1 10*3/uL (ref 0.0–0.5)
Eosinophils Relative: 1 %
HCT: 40.7 % (ref 39.0–52.0)
Hemoglobin: 13 g/dL (ref 13.0–17.0)
Immature Granulocytes: 1 %
Lymphocytes Relative: 9 %
Lymphs Abs: 0.8 10*3/uL (ref 0.7–4.0)
MCH: 29.5 pg (ref 26.0–34.0)
MCHC: 31.9 g/dL (ref 30.0–36.0)
MCV: 92.3 fL (ref 80.0–100.0)
Monocytes Absolute: 0.7 10*3/uL (ref 0.1–1.0)
Monocytes Relative: 8 %
Neutro Abs: 7 10*3/uL (ref 1.7–7.7)
Neutrophils Relative %: 80 %
Platelets: 259 10*3/uL (ref 150–400)
RBC: 4.41 MIL/uL (ref 4.22–5.81)
RDW: 16 % — ABNORMAL HIGH (ref 11.5–15.5)
WBC: 8.7 10*3/uL (ref 4.0–10.5)
nRBC: 0 % (ref 0.0–0.2)

## 2020-04-23 LAB — COMPREHENSIVE METABOLIC PANEL
ALT: 25 U/L (ref 0–44)
AST: 25 U/L (ref 15–41)
Albumin: 2.5 g/dL — ABNORMAL LOW (ref 3.5–5.0)
Alkaline Phosphatase: 113 U/L (ref 38–126)
Anion gap: 16 — ABNORMAL HIGH (ref 5–15)
BUN: 18 mg/dL (ref 8–23)
CO2: 36 mmol/L — ABNORMAL HIGH (ref 22–32)
Calcium: 9.5 mg/dL (ref 8.9–10.3)
Chloride: 104 mmol/L (ref 98–111)
Creatinine, Ser: 0.92 mg/dL (ref 0.61–1.24)
GFR calc Af Amer: >60 mL/min (ref >60)
GFR calc non Af Amer: >60 mL/min (ref >60)
Glucose, Bld: 76 mg/dL (ref 70–99)
Potassium: 2.7 mmol/L — CL (ref 3.5–5.1)
Sodium: 156 mmol/L — ABNORMAL HIGH (ref 135–145)
Total Bilirubin: 1.1 mg/dL (ref 0.3–1.2)
Total Protein: 5.8 g/dL — ABNORMAL LOW (ref 6.5–8.1)

## 2020-04-23 LAB — BASIC METABOLIC PANEL
Anion gap: 16 — ABNORMAL HIGH (ref 5–15)
BUN: 16 mg/dL (ref 8–23)
CO2: 36 mmol/L — ABNORMAL HIGH (ref 22–32)
Calcium: 9.5 mg/dL (ref 8.9–10.3)
Chloride: 104 mmol/L (ref 98–111)
Creatinine, Ser: 0.9 mg/dL (ref 0.61–1.24)
GFR calc Af Amer: >60 mL/min (ref >60)
GFR calc non Af Amer: >60 mL/min (ref >60)
Glucose, Bld: 78 mg/dL (ref 70–99)
Potassium: 3.7 mmol/L (ref 3.5–5.1)
Sodium: 156 mmol/L — ABNORMAL HIGH (ref 135–145)

## 2020-04-23 LAB — MAGNESIUM: Magnesium: 1.7 mg/dL (ref 1.7–2.4)

## 2020-04-23 MED ORDER — SODIUM CHLORIDE 0.45 % IV SOLN
INTRAVENOUS | Status: DC
  Filled 2020-04-23 (×14): qty 1000

## 2020-04-23 MED ORDER — POTASSIUM CHLORIDE 10 MEQ/100ML IV SOLN
10.0000 meq | INTRAVENOUS | Status: AC
  Administered 2020-04-23 (×6): 10 meq via INTRAVENOUS
  Filled 2020-04-23 (×4): qty 100

## 2020-04-23 NOTE — Progress Notes (Signed)
[facility] Progress Note  5 Days Post-Op  Subjective: CC-  Feeling much better today. He reports less abdominal distension. He is passing more flatus than yesterday, no BM.  NG tube with 4.1L output last 24 hours, but he is taking in a lot of ice.  Objective: Vital signs in last 24 hours: Temp:  [97.4 F (36.3 C)-98.2 F (36.8 C)] 98.2 F (36.8 C) (05/02 0939) Pulse Rate:  [79-81] 81 (05/02 0910) Resp:  [14-18] 14 (05/02 0910) BP: (139-144)/(73-84) 141/79 (05/02 0910) SpO2:  [93 %-98 %] 93 % (05/02 0910) Last BM Date: 05/20/20  Intake/Output from previous day: 05/01 0701 - 05/02 0700 In: 2235.3 [P.O.:630; I.V.:1405.3; IV Piggyback:200] Out: 5320 [Urine:1175; Emesis/NG output:4100; Drains:45] Intake/Output this shift: Total I/O In: 0  Out: 250 [Urine:250]  PE: Gen:  Alert, NAD, pleasant Pulm:  rate and effort normal Abd: Soft, ND, mild diffuse tenderness without peritonitis, lap incisions cdi, few BS heard, JP drain with serosanguinous fluid in bulb  Lab Results:  Recent Labs    04/22/20 0444 04/23/20 0519  WBC 10.8* 8.7  HGB 12.4* 13.0  HCT 38.7* 40.7  PLT 239 259   BMET Recent Labs    04/22/20 0444 04/23/20 0519  NA 153* 156*  K 3.1* 2.7*  CL 105 104  CO2 37* 36*  GLUCOSE 82 76  BUN 19 18  CREATININE 0.98 0.92  CALCIUM 9.4 9.5   PT/INR No results for input(s): LABPROT, INR in the last 72 hours. CMP     Component Value Date/Time   NA 156 (H) 04/23/2020 0519   NA 137 02/01/2020 0000   K 2.7 (LL) 04/23/2020 0519   CL 104 04/23/2020 0519   CO2 36 (H) 04/23/2020 0519   GLUCOSE 76 04/23/2020 0519   BUN 18 04/23/2020 0519   BUN 20 02/01/2020 0000   CREATININE 0.92 04/23/2020 0519   CALCIUM 9.5 04/23/2020 0519   PROT 5.8 (L) 04/23/2020 0519   PROT 7.3 03/08/2020 1628   ALBUMIN 2.5 (L) 04/23/2020 0519   ALBUMIN 4.4 03/08/2020 1628   AST 25 04/23/2020 0519   ALT 25 04/23/2020 0519   ALKPHOS 113 04/23/2020 0519   BILITOT 1.1  04/23/2020 0519   BILITOT 0.4 03/08/2020 1628   GFRNONAA >60 04/23/2020 0519   GFRAA >60 04/23/2020 0519   Lipase     Component Value Date/Time   LIPASE 20 04/21/2020 0625       Studies/Results: DG CHEST PORT 1 VIEW  Result Date: 04/22/2020 CLINICAL DATA:  Follow-up right pleural effusion EXAM: PORTABLE CHEST 1 VIEW COMPARISON:  Chest radiograph from one day prior. FINDINGS: Enteric tube terminates in the proximal stomach with side port in the lower thoracic esophagus. Stable cardiomediastinal silhouette with normal heart size. No pneumothorax. Trace right pleural effusion appears decreased. No left pleural effusion. No pulmonary edema. Curvilinear right lung base opacities, slightly decreased. IMPRESSION: 1. Trace right pleural effusion, decreased. 2. Curvilinear right lung base opacities, slightly decreased, favor atelectasis. 3. Enteric tube terminates in the proximal stomach with side port in the lower thoracic esophagus, consider advancing 5 cm. Electronically Signed   By: Delbert Phenix M.D.   On: 04/22/2020 07:13   DG CHEST PORT 1 VIEW  Result Date: 04/21/2020 CLINICAL DATA:  OG tube placement EXAM: PORTABLE CHEST 1 VIEW COMPARISON:  04/17/2020 FINDINGS: Interval placement of esophagogastric tube, tip at the gastroesophageal junction, side-port over the lower esophagus. New or increased right pleural effusion with associated atelectasis or consolidation. The left lung  is normally aerated. IMPRESSION: 1. Interval placement of esophagogastric tube, tip at the gastroesophageal junction, side-port over the lower esophagus. Recommend advancement. 2. New or increased right pleural effusion with associated atelectasis or consolidation. The left lung is normally aerated. Electronically Signed   By: Eddie Candle M.D.   On: 04/21/2020 11:20   DG Abd Portable 1V  Result Date: 04/22/2020 CLINICAL DATA:  Ileus. EXAM: PORTABLE ABDOMEN - 1 VIEW COMPARISON:  04/21/2020 FINDINGS: Tip of the nasogastric  tube is present over the stomach. There is continued evidence of several air-filled dilated central small bowel loops with slight interval improvement. Air is present throughout the colon. Remainder the exam is unchanged. IMPRESSION: Slight interval improvement of air-filled dilated small bowel loops likely improving partial obstruction or ileus. Electronically Signed   By: Marin Olp M.D.   On: 04/22/2020 16:15    Anti-infectives: Anti-infectives (From admission, onward)   Start     Dose/Rate Route Frequency Ordered Stop   04/20/20 0800  Ampicillin-Sulbactam (UNASYN) 3 g in sodium chloride 0.9 % 100 mL IVPB     3 g 200 mL/hr over 30 Minutes Intravenous Every 6 hours 04/19/20 1118 04/23/20 2359   04/19/20 1130  Ampicillin-Sulbactam (UNASYN) 3 g in sodium chloride 0.9 % 100 mL IVPB  Status:  Discontinued     3 g 200 mL/hr over 30 Minutes Intravenous Every 6 hours 04/19/20 1117 04/19/20 1118   04/18/20 1000  cefTRIAXone (ROCEPHIN) 2 g in sodium chloride 0.9 % 100 mL IVPB  Status:  Discontinued     2 g 200 mL/hr over 30 Minutes Intravenous Every 24 hours 04/17/20 1437 04/19/20 1117   04/18/20 1000  azithromycin (ZITHROMAX) 500 mg in sodium chloride 0.9 % 250 mL IVPB  Status:  Discontinued     500 mg 250 mL/hr over 60 Minutes Intravenous Every 24 hours 04/17/20 1437 04/19/20 1117   04/17/20 1145  cefTRIAXone (ROCEPHIN) 1 g in sodium chloride 0.9 % 100 mL IVPB     1 g 200 mL/hr over 30 Minutes Intravenous  Once 04/17/20 1141 04/17/20 1415   04/17/20 1145  azithromycin (ZITHROMAX) 500 mg in sodium chloride 0.9 % 250 mL IVPB     500 mg 250 mL/hr over 60 Minutes Intravenous  Once 04/17/20 1141 04/17/20 1902       Assessment/Plan MS Stenosis of right common iliac noted on CT UTI in setting of chronic indwelling Foley Aspiration PNA - Above per TRH -   Biliary Pancreatitis Acute on Chronic Cholecystitis  S/p Lap Chole, Dr. Ninfa Linden, 04/18/2020, POD #5 - Ileus, may be starting to  resolve as he is passing more flatus. Continue NGT to LIWS for now. Record how much he is taking in PO. K replaced by primary. - Continue JP drain and monitor output, currently serosanguinous - LFTand T bili WNL - will hold off on MRCP  FEN -ice chips and icees, IV fluids, NG tube to LIWS VTE -SCDs, Lovenox ID -Rocephin/Azithromycin4/26- 5/2. Unasyn 4/28 (for asp PNA - Per TRH) Foley-chronicindwelling  Follow-Up - TBD   LOS: 6 days    Wellington Hampshire, [facility] 04/23/2020, 10:46 AM Please see Amion for pager number during day hours 7:00am-4:30pm

## 2020-04-23 NOTE — Progress Notes (Signed)
PROGRESS NOTE    Stephen Wade  FMB:[id number] DOB: August 12, 1951 DOA: 04/17/2020 PCP: Wanda Plump, MD   Brief Narrative:  Stephen Wade is an 69 y.o. male with a past medical history that includes severe MS bedbound, dysphagia, chronic indwelling Foley catheter, cholelithiasis, paroxysmal atrial fibrillation not on anticoagulation, GERD who was admitted April 26 with acute biliary pancreatitis status post laparoscopic cholecystectomy on April 27.  Patient had persistent nausea with vomiting x-ray concerning for an ileus and an NG tube was placed on April 29.  Assessment & Plan:   Principal Problem:   Pancreatitis Active Problems:   URINARY CALCULUS   Spastic gait   Multiple sclerosis (HCC)   Pressure injury of skin   Chronic indwelling Foley catheter   Transaminitis   Dysphagia   Neurogenic bladder   Palliative care patient   Hypercalcemia  Acute biliary pancreatitis: Lipase slightly elevated but CT confirms pancreatitis. Cholelithiasis but no cholecystitis. General surgery on board. Status post laparoscopic cholecystectomy by Dr. Magnus Ivan.  Developed ileus postoperatively.  NG tube placed.  Suctioning well.  On ice chips.  Passing some gas but no bowel movement.  He is adamant that he should be allowed to have liquids.  I spent significant length of time trying to explain to him that it might not be a good idea while there is still significant amount of suction.  Although not happy but he was ok with that.  Repeat abdominal x-ray shows improvement in ileus.  He is once again adamant on letting him drink.  Finally he was okay with me allowing him to have pops.  History of dysphagia and aspiration pneumonia:  Imaging studies concerning for airspace opacities in the bilateral lower lungs right worse than left.  At baseline, patient declines being on a modified diet despite of understanding the risk of aspiration.  Continue Unasyn for total 7 days.  Chronic indwelling Foley catheter,  possible UTI vs. asymptomatic bacteriuria: Acute on chronic.  Patient has chronic indwelling Foley catheter due to neurogenic bladder with prior history of recurrent UTIs.  His wife has noted increased sediment in his urine.  She has been flushing the catheter twice daily with water and vinegar solution.  He has also been having the catheter changed out weekly. -Continue twice daily for Foley catheter flushes -Antibiotics as seen above  Hypercalcemia: Acute. Resolved.  Elevated LFTs: Liver enzymes mildly elevated including alkaline phosphatase 197, AST 59, and ALT 67.  This has resolved.  Suspect secondary to gallstone.  Multiple sclerosis with spastic gait: Patient is significantly debilitated and known to be wheelchair/bedbound at baseline.  Home medications include baclofen 10 mg 3 times daily as needed for muscle spasms and Teriflunomide 14 mg daily. -Continue home medication regimen  Pressure ulcer of the sacrum. poa: Acute on chronic.  Stage II pressure ulcer of the sacrum. -Low-air-loss bed replacement -Wound care on board.  Paroxysmal atrial fibrillation: Patient currently in sinus rhythm.CHA2DS2-VASc score = 1. -Continue to monitor. Not on any anticoagulation.  Palliative care patient: On patient's chart notes that he is on hospice, but wife reports that he is under palliative care.  Nephrolithiasis, bladder calculi: Incidental finding seen on CT that are nonobstructing.   DVT prophylaxis: Lovenox   Code Status: Full Code  Family Communication:  None present at bedside.  Spoke to wife at the bedside yesterday. Patient is from: Home Disposition Plan: Likely home in next 2 to 3 days. Barriers to discharge: Persistent postoperative ileus.  Status is: Inpatient  Remains inpatient appropriate because:IV treatments appropriate due to intensity of illness or inability to take PO   Dispo: The patient is from: Home              Anticipated d/c is to: Home               Anticipated d/c date is: 2 days/when cleared by general surgery              Patient currently is not medically stable to d/c.         Estimated body mass index is 26.46 kg/m as calculated from the following:   Height as of this encounter: 6' (1.829 m).   Weight as of this encounter: 88.5 kg.      Nutritional status:               Consultants:   General surgery  Procedures:   Laparoscopic cholecystectomy 04/18/2020  Antimicrobials:  Anti-infectives (From admission, onward)   Start     Dose/Rate Route Frequency Ordered Stop   04/20/20 0800  Ampicillin-Sulbactam (UNASYN) 3 g in sodium chloride 0.9 % 100 mL IVPB     3 g 200 mL/hr over 30 Minutes Intravenous Every 6 hours 04/19/20 1118 04/23/20 2359   04/19/20 1130  Ampicillin-Sulbactam (UNASYN) 3 g in sodium chloride 0.9 % 100 mL IVPB  Status:  Discontinued     3 g 200 mL/hr over 30 Minutes Intravenous Every 6 hours 04/19/20 1117 04/19/20 1118   04/18/20 1000  cefTRIAXone (ROCEPHIN) 2 g in sodium chloride 0.9 % 100 mL IVPB  Status:  Discontinued     2 g 200 mL/hr over 30 Minutes Intravenous Every 24 hours 04/17/20 1437 04/19/20 1117   04/18/20 1000  azithromycin (ZITHROMAX) 500 mg in sodium chloride 0.9 % 250 mL IVPB  Status:  Discontinued     500 mg 250 mL/hr over 60 Minutes Intravenous Every 24 hours 04/17/20 1437 04/19/20 1117   04/17/20 1145  cefTRIAXone (ROCEPHIN) 1 g in sodium chloride 0.9 % 100 mL IVPB     1 g 200 mL/hr over 30 Minutes Intravenous  Once 04/17/20 1141 04/17/20 1415   04/17/20 1145  azithromycin (ZITHROMAX) 500 mg in sodium chloride 0.9 % 250 mL IVPB     500 mg 250 mL/hr over 60 Minutes Intravenous  Once 04/17/20 1141 04/17/20 1902         Subjective: Seen and examined.  Continues to feel well.  No abdominal pain or nausea.  Passing flatus but no bowel movement.  Once again adamant on allowing him to drink.  Objective: Vitals:   04/22/20 2105 04/23/20 0500 04/23/20 0910 04/23/20  0939  BP: 139/80 (!) 141/84 (!) 141/79   Pulse: 80 79 81   Resp: 18 15 14    Temp: 98 F (36.7 C) 98.2 F (36.8 C)  98.2 F (36.8 C)  TempSrc:      SpO2: 93% 96% 93%   Weight:      Height:        Intake/Output Summary (Last 24 hours) at 04/23/2020 1243 Last data filed at 04/23/2020 1059 Gross per 24 hour  Intake 2551.31 ml  Output 5745 ml  Net -3193.69 ml   Filed Weights   04/17/20 0726  Weight: 88.5 kg    Examination:  General exam: Appears calm and comfortable, NG tube in place Respiratory system: Clear to auscultation. Respiratory effort normal. Cardiovascular system: S1 & S2 heard, RRR. No JVD, murmurs, rubs, gallops or clicks. No pedal  edema. Gastrointestinal system: Abdomen is nondistended, soft and nontender. No organomegaly or masses felt.  Diminished bowel sounds.. Central nervous system: Alert and oriented. No focal neurological deficits. Skin: No rashes, lesions or ulcers.  Psychiatry: Judgement and insight appear normal. Mood & affect appropriate.    Data Reviewed: I have personally reviewed following labs and imaging studies  CBC: Recent Labs  Lab 04/19/20 0434 04/20/20 0520 04/21/20 0625 04/22/20 0444 04/23/20 0519  WBC 11.6* 11.7* 14.2* 10.8* 8.7  NEUTROABS  --   --   --   --  7.0  HGB 12.8* 13.1 12.9* 12.4* 13.0  HCT 39.6 40.5 40.4 38.7* 40.7  MCV 91.2 92.5 91.8 93.3 92.3  PLT 227 217 230 239 878   Basic Metabolic Panel: Recent Labs  Lab 04/19/20 0434 04/20/20 0520 04/21/20 0625 04/22/20 0444 04/23/20 0519  NA 146* 147* 148* 153* 156*  K 4.5 4.1 3.6 3.1* 2.7*  CL 108 107 108 105 104  CO2 25 28 27  37* 36*  GLUCOSE 100* 108* 92 82 76  BUN 19 18 21 19 18   CREATININE 0.96 0.93 0.90 0.98 0.92  CALCIUM 10.1 9.8 9.3 9.4 9.5  MG  --   --   --   --  1.7   GFR: Estimated Creatinine Clearance: 85.5 mL/min (by C-G formula based on SCr of 0.92 mg/dL). Liver Function Tests: Recent Labs  Lab 04/19/20 0434 04/20/20 0520 04/21/20 0625  04/22/20 0444 04/23/20 0519  AST 60* 46* 21 22 25   ALT 59* 44 30 26 25   ALKPHOS 158* 140* 117 105 113  BILITOT 0.6 1.4* 0.9 0.6 1.1  PROT 6.6 6.2* 5.5* 5.6* 5.8*  ALBUMIN 3.2* 3.0* 2.4* 2.5* 2.5*   Recent Labs  Lab 04/17/20 0733 04/18/20 0514 04/21/20 0625  LIPASE 68* 49 20   No results for input(s): AMMONIA in the last 168 hours. Coagulation Profile: No results for input(s): INR, PROTIME in the last 168 hours. Cardiac Enzymes: No results for input(s): CKTOTAL, CKMB, CKMBINDEX, TROPONINI in the last 168 hours. BNP (last 3 results) No results for input(s): PROBNP in the last 8760 hours. HbA1C: No results for input(s): HGBA1C in the last 72 hours. CBG: No results for input(s): GLUCAP in the last 168 hours. Lipid Profile: No results for input(s): CHOL, HDL, LDLCALC, TRIG, CHOLHDL, LDLDIRECT in the last 72 hours. Thyroid Function Tests: No results for input(s): TSH, T4TOTAL, FREET4, T3FREE, THYROIDAB in the last 72 hours. Anemia Panel: No results for input(s): VITAMINB12, FOLATE, FERRITIN, TIBC, IRON, RETICCTPCT in the last 72 hours. Sepsis Labs: No results for input(s): PROCALCITON, LATICACIDVEN in the last 168 hours.  Recent Results (from the past 240 hour(s))  Urine Culture     Status: Abnormal   Collection Time: 04/17/20 11:42 AM   Specimen: Urine, Catheterized  Result Value Ref Range Status   Specimen Description URINE, CATHETERIZED  Final   Special Requests   Final    NONE Performed at [facility], [address] [place],  [address]    Culture (A)  Final    >=100,000 COLONIES/mL STAPHYLOCOCCUS AUREUS 20,000 COLONIES/mL ENTEROCOCCUS FAECALIS VANCOMYCIN RESISTANT ENTEROCOCCUS ISOLATED    Report Status 04/20/2020 FINAL  Final   Organism ID, Bacteria STAPHYLOCOCCUS AUREUS (A)  Final   Organism ID, Bacteria ENTEROCOCCUS FAECALIS (A)  Final      Susceptibility   Enterococcus faecalis - MIC*    AMPICILLIN <=2 SENSITIVE Sensitive     NITROFURANTOIN  <=16 SENSITIVE Sensitive     VANCOMYCIN >=  32 RESISTANT Resistant     LINEZOLID 2 SENSITIVE Sensitive     * 20,000 COLONIES/mL ENTEROCOCCUS FAECALIS   Staphylococcus aureus - MIC*    CIPROFLOXACIN >=8 RESISTANT Resistant     GENTAMICIN <=0.5 SENSITIVE Sensitive     NITROFURANTOIN <=16 SENSITIVE Sensitive     OXACILLIN <=0.25 SENSITIVE Sensitive     TETRACYCLINE <=1 SENSITIVE Sensitive     VANCOMYCIN <=0.5 SENSITIVE Sensitive     TRIMETH/SULFA <=10 SENSITIVE Sensitive     CLINDAMYCIN <=0.25 SENSITIVE Sensitive     RIFAMPIN <=0.5 SENSITIVE Sensitive     Inducible Clindamycin NEGATIVE Sensitive     * >=100,000 COLONIES/mL STAPHYLOCOCCUS AUREUS  Respiratory Panel by RT PCR (Flu A&B, Covid) - Nasopharyngeal Swab     Status: None   Collection Time: 04/17/20  1:56 PM   Specimen: Nasopharyngeal Swab  Result Value Ref Range Status   SARS Coronavirus 2 by RT PCR NEGATIVE NEGATIVE Final    Comment: (NOTE) SARS-CoV-2 target nucleic acids are NOT DETECTED. The SARS-CoV-2 RNA is generally detectable in upper respiratoy specimens during the acute phase of infection. The lowest concentration of SARS-CoV-2 viral copies this assay can detect is 131 copies/mL. A negative result does not preclude SARS-Cov-2 infection and should not be used as the sole basis for treatment or other patient management decisions. A negative result may occur with  improper specimen collection/handling, submission of specimen other than nasopharyngeal swab, presence of viral mutation(s) within the areas targeted by this assay, and inadequate number of viral copies (<131 copies/mL). A negative result must be combined with clinical observations, patient history, and epidemiological information. The expected result is Negative. Fact Sheet for Patients:  https://www.moore.com/ Fact Sheet for Healthcare Providers:  https://www.young.biz/ This test is not yet ap proved or cleared by the  [place] FDA and  has been authorized for detection and/or diagnosis of SARS-CoV-2 by FDA under an Emergency Use Authorization (EUA). This EUA will remain  in effect (meaning this test can be used) for the duration of the COVID-19 declaration under Section 564(b)(1) of the Act, 21 U.S.C. section 360bbb-3(b)(1), unless the authorization is terminated or revoked sooner.    Influenza A by PCR NEGATIVE NEGATIVE Final   Influenza B by PCR NEGATIVE NEGATIVE Final    Comment: (NOTE) The Xpert Xpress SARS-CoV-2/FLU/RSV assay is intended as an aid in  the diagnosis of influenza from Nasopharyngeal swab specimens and  should not be used as a sole basis for treatment. Nasal washings and  aspirates are unacceptable for Xpert Xpress SARS-CoV-2/FLU/RSV  testing. Fact Sheet for Patients: https://www.moore.com/ Fact Sheet for Healthcare Providers: https://www.young.biz/ This test is not yet approved or cleared by the [place] FDA and  has been authorized for detection and/or diagnosis of SARS-CoV-2 by  FDA under an Emergency Use Authorization (EUA). This EUA will remain  in effect (meaning this test can be used) for the duration of the  Covid-19 declaration under Section 564(b)(1) of the Act, 21  U.S.C. section 360bbb-3(b)(1), unless the authorization is  terminated or revoked. Performed at [facility], [address], [place], [place] [address]   Surgical pcr screen     Status: Abnormal   Collection Time: 04/17/20 11:11 PM   Specimen: Nasal Mucosa; Nasal Swab  Result Value Ref Range Status   MRSA, PCR NEGATIVE NEGATIVE Final   Staphylococcus aureus POSITIVE (A) NEGATIVE Final    Comment: (NOTE) The Xpert SA Assay (FDA approved for NASAL specimens in patients 26 years of age  and older), is one component of a comprehensive surveillance program. It is not intended to diagnose infection nor to guide or monitor treatment. Performed at [facility], [address], [place], [place] [address]       Radiology Studies: DG CHEST PORT 1 VIEW  Result Date: 04/22/2020 CLINICAL DATA:  Follow-up right pleural effusion EXAM: PORTABLE CHEST 1 VIEW COMPARISON:  Chest radiograph from one day prior. FINDINGS: Enteric tube terminates in the proximal stomach with side port in the lower thoracic esophagus. Stable cardiomediastinal silhouette with normal heart size. No pneumothorax. Trace right pleural effusion appears decreased. No left pleural effusion. No pulmonary edema. Curvilinear right lung base opacities, slightly decreased. IMPRESSION: 1. Trace right pleural effusion, decreased. 2. Curvilinear right lung base opacities, slightly decreased, favor atelectasis. 3. Enteric tube terminates in the proximal stomach with side port in the lower thoracic esophagus, consider advancing 5 cm. Electronically Signed   By: Delbert Phenix M.D.   On: 04/22/2020 07:13   DG Abd Portable 1V  Result Date: 04/22/2020 CLINICAL DATA:  Ileus. EXAM: PORTABLE ABDOMEN - 1 VIEW COMPARISON:  04/21/2020 FINDINGS: Tip of the nasogastric tube is present over the stomach. There is continued evidence of several air-filled dilated central small bowel loops with slight interval improvement. Air is present throughout the colon. Remainder the exam is unchanged. IMPRESSION: Slight interval improvement of air-filled dilated small bowel loops likely improving partial obstruction or ileus. Electronically Signed   By: Elberta Fortis M.D.   On: 04/22/2020 16:15    Scheduled Meds: . Chlorhexidine Gluconate Cloth  6 each Topical Daily  . enoxaparin (LOVENOX) injection  40 mg Subcutaneous Q24H  . Gerhardt's butt cream   Topical TID  . senna-docusate  1 tablet Oral BID  . sodium chloride flush  3 mL Intravenous Q12H   Continuous Infusions: . ampicillin-sulbactam (UNASYN) IV 3 g (04/23/20 0903)  . potassium chloride 10 mEq (04/23/20 1213)  . sodium chloride 0.45 % 1,000 mL with  potassium chloride 40 mEq infusion 125 mL/hr at 04/23/20 0947     LOS: 6 days   Time spent: 29 minutes   Hughie Closs, MD [facility]  04/23/2020, 12:43 PM   To contact the attending provider between 7A-7P or the covering provider during after hours 7P-7A, please log into the web site [email or web address].

## 2020-04-24 DIAGNOSIS — K851 Biliary acute pancreatitis without necrosis or infection: Secondary | ICD-10-CM | POA: Diagnosis not present

## 2020-04-24 LAB — BASIC METABOLIC PANEL
Anion gap: 15 (ref 5–15)
BUN: 18 mg/dL (ref 8–23)
CO2: 37 mmol/L — ABNORMAL HIGH (ref 22–32)
Calcium: 9.3 mg/dL (ref 8.9–10.3)
Chloride: 105 mmol/L (ref 98–111)
Creatinine, Ser: 1.01 mg/dL (ref 0.61–1.24)
GFR calc Af Amer: >60 mL/min (ref >60)
GFR calc non Af Amer: >60 mL/min (ref >60)
Glucose, Bld: 68 mg/dL — ABNORMAL LOW (ref 70–99)
Potassium: 3.8 mmol/L (ref 3.5–5.1)
Sodium: 157 mmol/L — ABNORMAL HIGH (ref 135–145)

## 2020-04-24 LAB — CBC WITH DIFFERENTIAL/PLATELET
Abs Immature Granulocytes: 0.09 10*3/uL — ABNORMAL HIGH (ref 0.00–0.07)
Basophils Absolute: 0.1 10*3/uL (ref 0.0–0.1)
Basophils Relative: 1 %
Eosinophils Absolute: 0.2 10*3/uL (ref 0.0–0.5)
Eosinophils Relative: 2 %
HCT: 41.2 % (ref 39.0–52.0)
Hemoglobin: 12.9 g/dL — ABNORMAL LOW (ref 13.0–17.0)
Immature Granulocytes: 1 %
Lymphocytes Relative: 11 %
Lymphs Abs: 0.9 10*3/uL (ref 0.7–4.0)
MCH: 29.6 pg (ref 26.0–34.0)
MCHC: 31.3 g/dL (ref 30.0–36.0)
MCV: 94.5 fL (ref 80.0–100.0)
Monocytes Absolute: 0.7 10*3/uL (ref 0.1–1.0)
Monocytes Relative: 8 %
Neutro Abs: 6.6 10*3/uL (ref 1.7–7.7)
Neutrophils Relative %: 77 %
Platelets: 281 10*3/uL (ref 150–400)
RBC: 4.36 MIL/uL (ref 4.22–5.81)
RDW: 16.1 % — ABNORMAL HIGH (ref 11.5–15.5)
WBC: 8.6 10*3/uL (ref 4.0–10.5)
nRBC: 0 % (ref 0.0–0.2)

## 2020-04-24 NOTE — Progress Notes (Signed)
Progress Note    [name]  ZOX:[id number] DOB: 1951-05-17  DOA: 04/17/2020 PCP: Wanda Plump, MD    Brief Narrative:     Medical records reviewed and are as summarized below:  Stephen Wade is an 69 y.o. male with a past medical history that includes severe MS bedbound, chronic indwelling Foley catheter, dysphagia, paroxysmal atrial fibrillation not on anticoagulation, GERD, cholelithiasis who was admitted April 26 with acute biliary pancreatitis status post laparoscopic cholecystectomy on April 27.  Patient had persistent nausea and vomiting x-ray concerning for an ileus and an NG tube was placed on April 29.  Assessment/Plan:   Principal Problem:   Pancreatitis Active Problems:   Multiple sclerosis (HCC)   Transaminitis   Dysphagia   URINARY CALCULUS   Spastic gait   Pressure injury of skin   Chronic indwelling Foley catheter   Hypercalcemia   Neurogenic bladder   Palliative care patient   #1. acute biliary pancreatitis.  Lipase was slightly elevated but CT confirmed pancreatitis.  Status post laparoscopic cholecystectomy.  Developed ileus postoperatively.  NG tube placed April 29.  Large amount of drainage but it is unclear how much of that is from his p.o. intake of ice chips and popsicles.  Sluggish bowel sounds abdomen soft.  Reports passing gas.  Of note a repeat abdominal x-ray showed improvement in ileus.  General surgery evaluated and recommended clamping NG tube today. -Monitor consequences of clamped NG -Further management per general surgery  #2.  History of dysphagia and aspiration pneumonia.  Imaging concerning for airspace opacities in the bilateral lower lungs right worse than left.  At baseline patient declined being on modified diet despite understanding risks of aspiration.  Continue Unasyn for total of 7 days.  Patient is afebrile hemodynamically stable and nontoxic-appearing -Unasyn last dose May 4  #3.  Chronic indwelling Foley catheter possible  UTI versus asymptomatic bacteriuria.  Acute on chronic.  Patient has a chronic indwelling Foley catheter due to neurogenic bladder with a history of recurrent UTIs.  On admission his wife noted increased sediment in his urine.  Home regimen includes Foley being flushed twice daily with water and vinegar solution.  In addition he has his catheter changed out weekly -Continue home regimen -Antibiotics as noted above  #4.  Hypercalcemia.  Acute.  Resolved.  #5.  Elevated LFTs.  Likely related to #1.  Resolved  #6.  Multiple sclerosis with spastic gait.  Patient is significantly debilitated and known to be wheelchair/bedbound at baseline.  Home medications include baclofen and Teriflunomide. -Continue home meds  #7.  Pressure ulcer of the sacrum.  Present on admission.  Acute on chronic.  Stage II pressure ulcer of the sacrum -Air mattress -Wound care consult appreciated  #8.  Paroxysmal atrial fibrillation.  Patient has been in sinus rhythm.  CHA2DS2-VASc score 1 -Monitor -Not on anticoagulation  #9.  Palliative care patient.  Chart review indicates he is on hospice but wife reported he is under palliative care  #10.  Nephrolithiasis, bladder calculi: Incidental finding on CT that are nonobstructing    Family Communication/Anticipated D/C date and plan/Code Status   DVT prophylaxis: Lovenox ordered. Code Status: Full Code.  Family Communication: patient with questions answered Disposition Plan: Status is: Inpatient  Remains inpatient appropriate because:Inpatient level of care appropriate due to severity of illness   Dispo: The patient is from: Home              Anticipated d/c is to: Home  Anticipated d/c date is: 3 days              Patient currently is not medically stable to d/c.          Medical Consultants:    General surgery s/p laparoscopic cholecystecomy 4/27   Anti-Infectives:    Rocephin  azithromycin  Subjective:   Sitting up in bed.   Requesting that NG tube be removed.  Denies pain or discomfort except for sore throat from NG tube states "I got a get out of here and get my life back".  Reports passing gas and feels like he may have a BM soon  Objective:    Vitals:   04/23/20 1712 04/23/20 2116 04/24/20 0458 04/24/20 0904  BP: (!) 146/77 (!) 152/84 (!) 153/91 (!) 149/86  Pulse: 82 82 83 89  Resp: 18 17 16 18   Temp:  98.2 F (36.8 C) 98.5 F (36.9 C) (!) 97.3 F (36.3 C)  TempSrc:    Axillary  SpO2: 95% 98% 94% 91%  Weight:      Height:        Intake/Output Summary (Last 24 hours) at 04/24/2020 1128 Last data filed at 04/24/2020 0900 Gross per 24 hour  Intake 3317.13 ml  Output 4780 ml  Net -1462.87 ml   Filed Weights   04/17/20 0726  Weight: 88.5 kg    Exam: General: Appears chronically ill calm cooperative NG tube in place CV: Regular rate and rhythm no murmur gallop or rub no lower extremity edema Respiratory: No increased work of breathing breath sounds are clear bilaterally I hear no wheezes no crackles Abdomen: Nondistended soft sluggish bowel sounds nontender to palpation no guarding or rebounding Musculoskeletal joints without swelling/erythema some spasms in his lower extremities Neuro: Alert and oriented x3 speech is slow but clear  Data Reviewed:   I have personally reviewed following labs and imaging studies:  Labs: Labs show the following:   Basic Metabolic Panel: Recent Labs  Lab 04/21/20 0625 04/21/20 0625 04/22/20 0444 04/22/20 0444 04/23/20 0519 04/23/20 0519 04/23/20 1528 04/24/20 0623  NA 148*  --  153*  --  156*  --  156* 157*  K 3.6   < > 3.1*   < > 2.7*   < > 3.7 3.8  CL 108  --  105  --  104  --  104 105  CO2 27  --  37*  --  36*  --  36* 37*  GLUCOSE 92  --  82  --  76  --  78 68*  BUN 21  --  19  --  18  --  16 18  CREATININE 0.90  --  0.98  --  0.92  --  0.90 1.01  CALCIUM 9.3  --  9.4  --  9.5  --  9.5 9.3  MG  --   --   --   --  1.7  --   --   --    < > =  values in this interval not displayed.   GFR Estimated Creatinine Clearance: 77.9 mL/min (by C-G formula based on SCr of 1.01 mg/dL). Liver Function Tests: Recent Labs  Lab 04/19/20 0434 04/20/20 0520 04/21/20 0625 04/22/20 0444 04/23/20 0519  AST 60* 46* 21 22 25   ALT 59* 44 30 26 25   ALKPHOS 158* 140* 117 105 113  BILITOT 0.6 1.4* 0.9 0.6 1.1  PROT 6.6 6.2* 5.5* 5.6* 5.8*  ALBUMIN 3.2* 3.0* 2.4* 2.5* 2.5*  Recent Labs  Lab 04/18/20 0514 04/21/20 0625  LIPASE 49 20   No results for input(s): AMMONIA in the last 168 hours. Coagulation profile No results for input(s): INR, PROTIME in the last 168 hours.  CBC: Recent Labs  Lab 04/20/20 0520 04/21/20 0625 04/22/20 0444 04/23/20 0519 04/24/20 0623  WBC 11.7* 14.2* 10.8* 8.7 8.6  NEUTROABS  --   --   --  7.0 6.6  HGB 13.1 12.9* 12.4* 13.0 12.9*  HCT 40.5 40.4 38.7* 40.7 41.2  MCV 92.5 91.8 93.3 92.3 94.5  PLT 217 230 239 259 281   Cardiac Enzymes: No results for input(s): CKTOTAL, CKMB, CKMBINDEX, TROPONINI in the last 168 hours. BNP (last 3 results) No results for input(s): PROBNP in the last 8760 hours. CBG: No results for input(s): GLUCAP in the last 168 hours. D-Dimer: No results for input(s): DDIMER in the last 72 hours. Hgb A1c: No results for input(s): HGBA1C in the last 72 hours. Lipid Profile: No results for input(s): CHOL, HDL, LDLCALC, TRIG, CHOLHDL, LDLDIRECT in the last 72 hours. Thyroid function studies: No results for input(s): TSH, T4TOTAL, T3FREE, THYROIDAB in the last 72 hours.  Invalid input(s): FREET3 Anemia work up: No results for input(s): VITAMINB12, FOLATE, FERRITIN, TIBC, IRON, RETICCTPCT in the last 72 hours. Sepsis Labs: Recent Labs  Lab 04/21/20 0625 04/22/20 0444 04/23/20 0519 04/24/20 0623  WBC 14.2* 10.8* 8.7 8.6    Microbiology Recent Results (from the past 240 hour(s))  Urine Culture     Status: Abnormal   Collection Time: 04/17/20 11:42 AM   Specimen: Urine,  Catheterized  Result Value Ref Range Status   Specimen Description URINE, CATHETERIZED  Final   Special Requests   Final    NONE Performed at [facility], [address] [place], [place] [address]    Culture (A)  Final    >=100,000 COLONIES/mL STAPHYLOCOCCUS AUREUS 20,000 COLONIES/mL ENTEROCOCCUS FAECALIS VANCOMYCIN RESISTANT ENTEROCOCCUS ISOLATED    Report Status 04/20/2020 FINAL  Final   Organism ID, Bacteria STAPHYLOCOCCUS AUREUS (A)  Final   Organism ID, Bacteria ENTEROCOCCUS FAECALIS (A)  Final      Susceptibility   Enterococcus faecalis - MIC*    AMPICILLIN <=2 SENSITIVE Sensitive     NITROFURANTOIN <=16 SENSITIVE Sensitive     VANCOMYCIN >=32 RESISTANT Resistant     LINEZOLID 2 SENSITIVE Sensitive     * 20,000 COLONIES/mL ENTEROCOCCUS FAECALIS   Staphylococcus aureus - MIC*    CIPROFLOXACIN >=8 RESISTANT Resistant     GENTAMICIN <=0.5 SENSITIVE Sensitive     NITROFURANTOIN <=16 SENSITIVE Sensitive     OXACILLIN <=0.25 SENSITIVE Sensitive     TETRACYCLINE <=1 SENSITIVE Sensitive     VANCOMYCIN <=0.5 SENSITIVE Sensitive     TRIMETH/SULFA <=10 SENSITIVE Sensitive     CLINDAMYCIN <=0.25 SENSITIVE Sensitive     RIFAMPIN <=0.5 SENSITIVE Sensitive     Inducible Clindamycin NEGATIVE Sensitive     * >=100,000 COLONIES/mL STAPHYLOCOCCUS AUREUS  Respiratory Panel by RT PCR (Flu A&B, Covid) - Nasopharyngeal Swab     Status: None   Collection Time: 04/17/20  1:56 PM   Specimen: Nasopharyngeal Swab  Result Value Ref Range Status   SARS Coronavirus 2 by RT PCR NEGATIVE NEGATIVE Final    Comment: (NOTE) SARS-CoV-2 target nucleic acids are NOT DETECTED. The SARS-CoV-2 RNA is generally detectable in upper respiratoy specimens during the acute phase of infection. The lowest concentration of SARS-CoV-2 viral copies this assay can detect is 131 copies/mL. A  negative result does not preclude SARS-Cov-2 infection and should not be used as the sole basis for treatment or other  patient management decisions. A negative result may occur with  improper specimen collection/handling, submission of specimen other than nasopharyngeal swab, presence of viral mutation(s) within the areas targeted by this assay, and inadequate number of viral copies (<131 copies/mL). A negative result must be combined with clinical observations, patient history, and epidemiological information. The expected result is Negative. Fact Sheet for Patients:  https://www.moore.com/ Fact Sheet for Healthcare Providers:  https://www.young.biz/ This test is not yet ap proved or cleared by the [place] FDA and  has been authorized for detection and/or diagnosis of SARS-CoV-2 by FDA under an Emergency Use Authorization (EUA). This EUA will remain  in effect (meaning this test can be used) for the duration of the COVID-19 declaration under Section 564(b)(1) of the Act, 21 U.S.C. section 360bbb-3(b)(1), unless the authorization is terminated or revoked sooner.    Influenza A by PCR NEGATIVE NEGATIVE Final   Influenza B by PCR NEGATIVE NEGATIVE Final    Comment: (NOTE) The Xpert Xpress SARS-CoV-2/FLU/RSV assay is intended as an aid in  the diagnosis of influenza from Nasopharyngeal swab specimens and  should not be used as a sole basis for treatment. Nasal washings and  aspirates are unacceptable for Xpert Xpress SARS-CoV-2/FLU/RSV  testing. Fact Sheet for Patients: https://www.moore.com/ Fact Sheet for Healthcare Providers: https://www.young.biz/ This test is not yet approved or cleared by the [place] FDA and  has been authorized for detection and/or diagnosis of SARS-CoV-2 by  FDA under an Emergency Use Authorization (EUA). This EUA will remain  in effect (meaning this test can be used) for the duration of the  Covid-19 declaration under Section 564(b)(1) of the Act, 21  U.S.C. section 360bbb-3(b)(1), unless  the authorization is  terminated or revoked. Performed at [facility], [address], [place], [place] [address]   Surgical pcr screen     Status: Abnormal   Collection Time: 04/17/20 11:11 PM   Specimen: Nasal Mucosa; Nasal Swab  Result Value Ref Range Status   MRSA, PCR NEGATIVE NEGATIVE Final   Staphylococcus aureus POSITIVE (A) NEGATIVE Final    Comment: (NOTE) The Xpert SA Assay (FDA approved for NASAL specimens in patients 31 years of age and older), is one component of a comprehensive surveillance program. It is not intended to diagnose infection nor to guide or monitor treatment. Performed at [facility], [address] [place], [place] [address]     Procedures and diagnostic studies:  No results found.  Medications:    Chlorhexidine Gluconate Cloth  6 each Topical Daily   enoxaparin (LOVENOX) injection  40 mg Subcutaneous Q24H   Gerhardt's butt cream   Topical TID   senna-docusate  1 tablet Oral BID   sodium chloride flush  3 mL Intravenous Q12H   Continuous Infusions:  sodium chloride 0.45 % 1,000 mL with potassium chloride 40 mEq infusion 125 mL/hr at 04/24/20 0900     LOS: 7 days   [facility] MNP [facility]   How to contact the [facility] Attending or Consulting provider 7A - 7P or covering provider during after hours 7P -7A, for this patient?  1. Check the care team in [facility] and look for a) attending/consulting TRH provider listed and b) the [facility] team listed 2. Log into [email or web address] and use [place]'s universal password to access. If you do not have the password, please contact the hospital operator.  3. Locate the [facility] provider you are looking for under [facility] and page to a number that you can be directly reached. 4. If you still have difficulty reaching the provider, please page the [facility] (Director on Call) for the Hospitalists listed on amion for assistance.  04/24/2020, 11:28 AM

## 2020-04-24 NOTE — TOC Initial Note (Signed)
Transition of Care [facility]) - Initial/Assessment Note    Patient Details  Name: Stephen Wade MRN: [id number] Date of Birth: Jun 12, 1951  Transition of [facility]) CM/SW Contact:    Bess Kinds, RN Phone Number: [phone] 04/24/2020, 2:31 PM  Clinical Narrative:                  Spoke with patient and spouse at the bedside. PTA patient home with spouse. He has hospital bed, power chair, wheelchair, and wheelchair accessible van. Private pay aide for 2 hours a day. [facility] for RN. PT/OT evals pending. [facility] accepted patient for RN, PT, and OT. Will need HH orders at discharge. Patient will need ambulance transportation at time of discharge. TOC team following for transition needs.    Expected Discharge Plan: Home w Home Health Services Barriers to Discharge: Continued Medical Work up   Patient Goals and CMS Choice Patient states their goals for this hospitalization and ongoing recovery are:: return home with spouse CMS Medicare.gov Compare Post Acute Care list provided to:: Patient Choice offered to / list presented to : Patient, Spouse  Expected Discharge Plan and Services Expected Discharge Plan: Home w Home Health Services   Discharge Planning Services: CM Consult Post Acute Care Choice: Home Health Living arrangements for the past 2 months: Single Family Home                 DME Arranged: N/A DME Agency: NA       HH Arranged: RN, PT, OT HH Agency: [facility] Date HH Agency Contacted: 04/24/20 Time HH Agency Contacted: [phone] Representative spoke with at [facility] Agency: Kathlene November  Prior Living Arrangements/Services Living arrangements for the past 2 months: Single Family Home Lives with:: Self, Spouse Patient language and need for interpreter reviewed:: Yes Do you feel safe going back to the place where you live?: Yes      Need for Family Participation in Patient Care: Yes (Comment) Care giver support system in place?: Yes (comment) Current home services: DME,  Home RN, Homehealth aide(hospital bed; power chair) Criminal Activity/Legal Involvement Pertinent to Current Situation/Hospitalization: No - Comment as needed  Activities of Daily Living Home Assistive Devices/Equipment: Wheelchair, Eyeglasses ADL Screening (condition at time of admission) Patient's cognitive ability adequate to safely complete daily activities?: Yes Is the patient deaf or have difficulty hearing?: No Does the patient have difficulty seeing, even when wearing glasses/contacts?: No Does the patient have difficulty concentrating, remembering, or making decisions?: No Patient able to express need for assistance with ADLs?: Yes Does the patient have difficulty dressing or bathing?: Yes Independently performs ADLs?: No Communication: Independent Dressing (OT): Dependent Is this a change from baseline?: Pre-admission baseline Grooming: Dependent Is this a change from baseline?: Pre-admission baseline Feeding: Needs assistance Is this a change from baseline?: Pre-admission baseline Toileting: Dependent Is this a change from baseline?: Pre-admission baseline In/Out Bed: Dependent Is this a change from baseline?: Pre-admission baseline Walks in Home: Dependent Is this a change from baseline?: Pre-admission baseline Does the patient have difficulty walking or climbing stairs?: Yes Weakness of Legs: Both Weakness of Arms/Hands: None  Permission Sought/Granted Permission sought to share information with : Family Supports Permission granted to share information with : Yes, Verbal Permission Granted  Share Information with NAME: Stephen Wade     Permission granted to share info w Relationship: spouse  Permission granted to share info w Contact Information: [phone]  Emotional Assessment Appearance:: Appears stated age Attitude/Demeanor/Rapport: Engaged Affect (typically observed): Accepting Orientation: :  Oriented to Self, Oriented to  Time, Oriented to Place,  Oriented to Situation Alcohol / Substance Use: Not Applicable Psych Involvement: No (comment)  Admission diagnosis:  Pancreatitis [K85.90] Upper abdominal pain [R10.10] Acute pancreatitis without infection or necrosis, unspecified pancreatitis type [K85.90] Aspiration pneumonia of both lower lobes, unspecified aspiration pneumonia type [facility]) [J69.0] Patient Active Problem List   Diagnosis Date Noted  . Hypercalcemia 04/18/2020  . Pancreatitis 04/17/2020  . High risk medication use 03/10/2020  . Abnormality of gait 01/18/2020  . Noncompliance   . Depressive reaction   . Goals of care, counseling/discussion   . Encounter for hospice care discussion   . Palliative care patient   . Neurogenic bowel   . Slow transit constipation   . Acute lower UTI   . Neurogenic bladder   . Spasticity   . Depression   . Debility 12/08/2019  . Acute blood loss anemia   . Transaminitis   . Hypoalbuminemia due to protein-calorie malnutrition ([place])   . Hypernatremia   . Steroid-induced hyperglycemia   . AKI (acute kidney injury) ([place])   . Dysphagia   . Acute respiratory failure ([place]) 12/07/2019  . Chronic indwelling Foley catheter 12/07/2019  . Acute respiratory failure with hypoxia ([place]) 11/28/2019  . Pneumonia 11/28/2019  . Atrial fibrillation with RVR ([place]) 11/28/2019  . UTI (urinary tract infection) 11/27/2019  . Acute metabolic encephalopathy 76/73/4193  . Urinary retention 06/22/2019  . PCP NOTES >>>>>>> 05/24/2018  . Depression, recurrent ([place]) 05/24/2018  . History of recurrent UTIs 05/22/2018  . Pressure injury of skin 11/06/2017  . Multiple sclerosis ([place]) 11/04/2017  . Skin ulcer of sacral region ([place]) 05/26/2017  . Vitamin D deficiency 01/16/2016  . Spastic gait 02/10/2015  . Spastic diplegia ([place]) 02/10/2015  . Other fatigue 02/10/2015  . Disturbed cognition 02/10/2015  . Edema 05/12/2014  . Hypogonadism male 03/11/2013  . BPH - self caths 12/26/2011  . GERD 11/30/2010  .  URINARY CALCULUS 10/05/2010   PCP:  Colon Branch, MD Pharmacy:   [facility] [phone] [address], [address] AT [facility] [address] [place] [address] Phone: [phone] Fax: [phone]     Social Determinants of Health ([place]) Interventions    Readmission Risk Interventions No flowsheet data found.

## 2020-04-24 NOTE — Progress Notes (Signed)
Patient ID: Stephen Wade, male   DOB: 12-14-1951, 69 y.o.   MRN: [id number]    6 Days Post-Op  Subjective: + flatus, feels like he is getting ready to have a BM.  Denies abdominal pain.  ROS: See above, otherwise other systems negative  Objective: Vital signs in last 24 hours: Temp:  [97.3 F (36.3 C)-98.5 F (36.9 C)] 97.3 F (36.3 C) (05/03 0904) Pulse Rate:  [82-89] 89 (05/03 0904) Resp:  [16-18] 18 (05/03 0904) BP: (146-153)/(77-91) 149/86 (05/03 0904) SpO2:  [91 %-98 %] 91 % (05/03 0904) Last BM Date: 04/20/20  Intake/Output from previous day: 05/02 0701 - 05/03 0700 In: 3383.1 [P.O.:896; I.V.:1559.1] Out: 4950 [Urine:950; Emesis/NG output:4000] Intake/Output this shift: Total I/O In: 250 [I.V.:250] Out: 1030 [Urine:250; Emesis/NG output:750; Drains:30]  PE: Abd: soft, some BS, NGT with minimal output but apparently cannister just changed. Incisions c/d/i.  JP drain with more fresh bloody output. 30cc emptied.  No one sure when the drain was emptied last.  Lab Results:  Recent Labs    04/23/20 0519 04/24/20 0623  WBC 8.7 8.6  HGB 13.0 12.9*  HCT 40.7 41.2  PLT 259 281   BMET Recent Labs    04/23/20 1528 04/24/20 0623  NA 156* 157*  K 3.7 3.8  CL 104 105  CO2 36* 37*  GLUCOSE 78 68*  BUN 16 18  CREATININE 0.90 1.01  CALCIUM 9.5 9.3   PT/INR No results for input(s): LABPROT, INR in the last 72 hours. CMP     Component Value Date/Time   NA 157 (H) 04/24/2020 0623   NA 137 02/01/2020 0000   K 3.8 04/24/2020 0623   CL 105 04/24/2020 0623   CO2 37 (H) 04/24/2020 0623   GLUCOSE 68 (L) 04/24/2020 0623   BUN 18 04/24/2020 0623   BUN 20 02/01/2020 0000   CREATININE 1.01 04/24/2020 0623   CALCIUM 9.3 04/24/2020 0623   PROT 5.8 (L) 04/23/2020 0519   PROT 7.3 03/08/2020 1628   ALBUMIN 2.5 (L) 04/23/2020 0519   ALBUMIN 4.4 03/08/2020 1628   AST 25 04/23/2020 0519   ALT 25 04/23/2020 0519   ALKPHOS 113 04/23/2020 0519   BILITOT 1.1 04/23/2020 0519     BILITOT 0.4 03/08/2020 1628   GFRNONAA >60 04/24/2020 0623   GFRAA >60 04/24/2020 0623   Lipase     Component Value Date/Time   LIPASE 20 04/21/2020 0625       Studies/Results: No results found.  Anti-infectives: Anti-infectives (From admission, onward)   Start     Dose/Rate Route Frequency Ordered Stop   04/20/20 0800  Ampicillin-Sulbactam (UNASYN) 3 g in sodium chloride 0.9 % 100 mL IVPB     3 g 200 mL/hr over 30 Minutes Intravenous Every 6 hours 04/19/20 1118 04/23/20 2137   04/19/20 1130  Ampicillin-Sulbactam (UNASYN) 3 g in sodium chloride 0.9 % 100 mL IVPB  Status:  Discontinued     3 g 200 mL/hr over 30 Minutes Intravenous Every 6 hours 04/19/20 1117 04/19/20 1118   04/18/20 1000  cefTRIAXone (ROCEPHIN) 2 g in sodium chloride 0.9 % 100 mL IVPB  Status:  Discontinued     2 g 200 mL/hr over 30 Minutes Intravenous Every 24 hours 04/17/20 1437 04/19/20 1117   04/18/20 1000  azithromycin (ZITHROMAX) 500 mg in sodium chloride 0.9 % 250 mL IVPB  Status:  Discontinued     500 mg 250 mL/hr over 60 Minutes Intravenous Every 24 hours 04/17/20 1437 04/19/20 1117  04/17/20 1145  cefTRIAXone (ROCEPHIN) 1 g in sodium chloride 0.9 % 100 mL IVPB     1 g 200 mL/hr over 30 Minutes Intravenous  Once 04/17/20 1141 04/17/20 1415   04/17/20 1145  azithromycin (ZITHROMAX) 500 mg in sodium chloride 0.9 % 250 mL IVPB     500 mg 250 mL/hr over 60 Minutes Intravenous  Once 04/17/20 1141 04/17/20 1902       Assessment/Plan MS Stenosis of right common iliac noted on CT UTI in setting of chronic indwelling Foley Aspiration PNA - Above per TRH -   Biliary Pancreatitis Acute on Chronic Cholecystitis  S/p Lap Chole, Dr. Ninfa Linden, 04/18/2020, POD #6 - Ileus, clamp NGT today given he is passing flatus and getting ready to have a BM.  Unclear if NGT output has been so high due to ice chips as he can have all he wants - Continue JP drain and monitor output, currently pretty bloody.  hgb  stable though - LFTand T bili WNL  FEN -clamp NGT today  VTE -SCDs, Lovenox ID -Rocephin/Azithromycin4/26- 5/2. Unasyn 4/28 (for asp PNA - Per TRH) Foley-chronicindwelling  Follow-Up - TBD   LOS: 7 days    Henreitta Cea , [facility] 04/24/2020, 10:48 AM Please see Amion for pager number during day hours 7:00am-4:30pm or 7:00am -11:30am on weekends

## 2020-04-24 NOTE — Progress Notes (Signed)
Noted dried bloody discharge from exit site of JP drainage right mid quadrant. Dressing change and noted blood leaking from exit site.changed dressing with drain gauze and pressure dressing in place with Barnetta Chapel, PA notified with no further orders made.

## 2020-04-24 NOTE — Evaluation (Signed)
Occupational Therapy Evaluation Patient Details Name: Stephen Wade MRN: [id number] DOB: 05/17/1951 Today's Date: 04/24/2020    History of Present Illness 70 y.o. male with a past medical history that includes severe MS bedbound, chronic indwelling Foley catheter, dysphagia, paroxysmal atrial fibrillation not on anticoagulation, GERD, cholelithiasis who was admitted April 26 with acute biliary pancreatitis status post laparoscopic cholecystectomy on April 27.  Patient had persistent nausea and vomiting x-ray concerning for an ileus and an NG tube was placed on April 29.   Clinical Impression   This 69 y/o male presents with the above. PTA pt reports was transferring to power scooter with external assist (from spouse and/or caregiver) and receiving assist for ADL tasks; pt reporting level of assist varied "depending on the day". He has an aide who assists for approx 2hrs/day. Pt would not attempt to mobilize/sit EOB with therapy today despite education/efforts (suspect may require two person assist for safe completion). He currently presents with notable UE and LE weakness (LE>UE); requiring at least modA for UB ADL and totalA for LB ADL. Pt will benefit from continued acute OT services; pt is currently at SNF level for therapy services, however pt very adamant to return home at time of discharge and declining option of post acute rehab. Pt/pt's spouse report they no longer have hoyer lift in the home however given pt having been in bed an increased number of days feel they will likely benefit from hoyer initially if choosing to return home to maximize pt's safety and to reduce overall level of caregiver burden. If pt/pt's spouse opting for home recommend max HH services.     Follow Up Recommendations  Home health OT;Supervision/Assistance - 24 hour;SNF(currently declining SNF)    Equipment Recommendations  Other (comment)(may require hoyer)           Precautions / Restrictions  Precautions Precautions: Fall Precaution Comments: indwelling foley, JP drain Restrictions Weight Bearing Restrictions: No      Mobility Bed Mobility Overal bed mobility: Needs Assistance             General bed mobility comments: pt would not attempt bed mobility with therapist today (declined attempts for bed mobility and for attempts to sit EOB) - suspect may require +2 initially   Transfers                          ADL either performed or assessed with clinical judgement   ADL Overall ADL's : Needs assistance/impaired Eating/Feeding: NPO Eating/Feeding Details (indicate cue type and reason): except ice chips Grooming: Set up;Min guard;Bed level   Upper Body Bathing: Minimal assistance;Bed level   Lower Body Bathing: Total assistance;Bed level   Upper Body Dressing : Moderate assistance;Bed level   Lower Body Dressing: Total assistance;Bed level                 General ADL Comments: pt only agreeable to bed level activity today, presenting with notable weakness, expressive difficulties     Pertinent Vitals/Pain Pain Assessment: No/denies pain     Hand Dominance Right   Extremity/Trunk Assessment Upper Extremity Assessment Upper Extremity Assessment: Generalized weakness;RUE deficits/detail RUE Deficits / Details: RUE grossly weaker than LUE (grossly 3+/5), pt reports strength fluctuates "based on the day"   Lower Extremity Assessment Lower Extremity Assessment: Defer to PT evaluation       Communication Communication Communication: Expressive difficulties   Cognition Arousal/Alertness: Awake/alert Behavior During Therapy: WFL for tasks assessed/performed Overall Cognitive Status:  Within Functional Limits for tasks assessed                                 General Comments: for basic tasks assessed today   General Comments       Exercises Exercises: Other exercises Other Exercises Other Exercises: PROM to bil LEs,  knee flexion, x10 Other Exercises: passive streching to heel cords bil LE   Shoulder Instructions      Home Living Family/patient expects to be discharged to:: Private residence Living Arrangements: Spouse/significant other Available Help at Discharge: Family;Available 24 hours/day Type of Home: House Home Access: Ramped entrance(spouse reports they "have a lift")     Home Layout: Able to live on main level with bedroom/bathroom     Bathroom Shower/Tub: Occupational psychologist: Handicapped height     Home Equipment: Transport planner;Wheelchair - Education officer, community - power;Hospital bed;Tub bench;Bedside commode   Additional Comments: spouse reports have returned hoyer since last d/c home as they were not requiring use of it       Prior Functioning/Environment Level of Independence: Needs assistance  Gait / Transfers Assistance Needed: pt reports he is able to transfer to his power scooter with external assist - level of assist fluctates "based on the day", they have a slide board but pt typically transfers without - with assist of spouse or pt's aide ADL's / Homemaking Assistance Needed: pt receives assist for bathing/dressing. Pt's aide comes for 2hrs/day, 7 days/wk to assist             OT Problem List: Decreased strength;Decreased range of motion;Decreased activity tolerance;Decreased knowledge of use of DME or AE;Impaired UE functional use;Impaired balance (sitting and/or standing)      OT Treatment/Interventions: Self-care/ADL training;Therapeutic exercise;DME and/or AE instruction;Therapeutic activities;Cognitive remediation/compensation;Patient/family education;Balance training;Energy conservation;Manual therapy    OT Goals(Current goals can be found in the care plan section) Acute Rehab OT Goals Patient Stated Goal: "home" OT Goal Formulation: With patient Time For Goal Achievement: 05/08/20 Potential to Achieve Goals: Fair  OT Frequency: Min 2X/week    Barriers to D/C:            Co-evaluation              AM-PAC OT "6 Clicks" Daily Activity     Outcome Measure Help from another person eating meals?: Total(NPO) Help from another person taking care of personal grooming?: A Little Help from another person toileting, which includes using toliet, bedpan, or urinal?: Total Help from another person bathing (including washing, rinsing, drying)?: A Lot Help from another person to put on and taking off regular upper body clothing?: A Lot Help from another person to put on and taking off regular lower body clothing?: Total 6 Click Score: 10   End of Session Nurse Communication: Mobility status;Need for lift equipment  Activity Tolerance: Patient tolerated treatment well;Other (comment)(pt self-limiting ) Patient left: in bed;with call bell/phone within reach;with family/visitor present  OT Visit Diagnosis: Muscle weakness (generalized) (M62.81);Other abnormalities of gait and mobility (R26.89)                Time: 7782-4235 OT Time Calculation (min): 21 min Charges:  OT General Charges $OT Visit: 1 Visit OT Evaluation $OT Eval Moderate Complexity: [place], OT Acute Rehabilitation Services Pager [phone] Office [place] 04/24/2020, 5:17 PM

## 2020-04-24 NOTE — Plan of Care (Signed)
  Problem: Elimination: Goal: Will not experience complications related to urinary retention Outcome: Adequate for Discharge   

## 2020-04-24 NOTE — Plan of Care (Signed)
  Problem: Coping: Goal: Level of anxiety will decrease Outcome: Progressing   Problem: Urinary Elimination: Goal: Signs and symptoms of infection will decrease Outcome: Progressing

## 2020-04-25 DIAGNOSIS — K851 Biliary acute pancreatitis without necrosis or infection: Secondary | ICD-10-CM | POA: Diagnosis not present

## 2020-04-25 LAB — BASIC METABOLIC PANEL
Anion gap: 10 (ref 5–15)
BUN: 17 mg/dL (ref 8–23)
CO2: 33 mmol/L — ABNORMAL HIGH (ref 22–32)
Calcium: 8.9 mg/dL (ref 8.9–10.3)
Chloride: 107 mmol/L (ref 98–111)
Creatinine, Ser: 0.84 mg/dL (ref 0.61–1.24)
GFR calc Af Amer: >60 mL/min (ref >60)
GFR calc non Af Amer: >60 mL/min (ref >60)
Glucose, Bld: 91 mg/dL (ref 70–99)
Potassium: 3.7 mmol/L (ref 3.5–5.1)
Sodium: 150 mmol/L — ABNORMAL HIGH (ref 135–145)

## 2020-04-25 LAB — CBC WITH DIFFERENTIAL/PLATELET
Abs Immature Granulocytes: 0.1 10*3/uL — ABNORMAL HIGH (ref 0.00–0.07)
Basophils Absolute: 0.1 10*3/uL (ref 0.0–0.1)
Basophils Relative: 1 %
Eosinophils Absolute: 0.3 10*3/uL (ref 0.0–0.5)
Eosinophils Relative: 3 %
HCT: 36.6 % — ABNORMAL LOW (ref 39.0–52.0)
Hemoglobin: 11.6 g/dL — ABNORMAL LOW (ref 13.0–17.0)
Immature Granulocytes: 1 %
Lymphocytes Relative: 12 %
Lymphs Abs: 1.1 10*3/uL (ref 0.7–4.0)
MCH: 29.7 pg (ref 26.0–34.0)
MCHC: 31.7 g/dL (ref 30.0–36.0)
MCV: 93.6 fL (ref 80.0–100.0)
Monocytes Absolute: 0.8 10*3/uL (ref 0.1–1.0)
Monocytes Relative: 9 %
Neutro Abs: 6.3 10*3/uL (ref 1.7–7.7)
Neutrophils Relative %: 74 %
Platelets: 245 10*3/uL (ref 150–400)
RBC: 3.91 MIL/uL — ABNORMAL LOW (ref 4.22–5.81)
RDW: 16.2 % — ABNORMAL HIGH (ref 11.5–15.5)
WBC: 8.6 10*3/uL (ref 4.0–10.5)
nRBC: 0 % (ref 0.0–0.2)

## 2020-04-25 MED ORDER — CHLORHEXIDINE GLUCONATE 0.12 % MT SOLN
15.0000 mL | Freq: Two times a day (BID) | OROMUCOSAL | Status: DC
  Administered 2020-04-25 – 2020-04-26 (×4): 15 mL via OROMUCOSAL
  Filled 2020-04-25 (×3): qty 15

## 2020-04-25 MED ORDER — BISACODYL 10 MG RE SUPP
10.0000 mg | Freq: Once | RECTAL | Status: DC
  Filled 2020-04-25 (×2): qty 1

## 2020-04-25 MED ORDER — POTASSIUM CHLORIDE 10 MEQ/100ML IV SOLN
10.0000 meq | INTRAVENOUS | Status: AC
  Administered 2020-04-25 (×4): 10 meq via INTRAVENOUS
  Filled 2020-04-25 (×4): qty 100

## 2020-04-25 MED ORDER — ORAL CARE MOUTH RINSE
15.0000 mL | Freq: Two times a day (BID) | OROMUCOSAL | Status: DC
  Administered 2020-04-25 – 2020-04-26 (×4): 15 mL via OROMUCOSAL

## 2020-04-25 MED ORDER — BOOST / RESOURCE BREEZE PO LIQD CUSTOM
1.0000 | Freq: Three times a day (TID) | ORAL | Status: DC
  Administered 2020-04-25 – 2020-04-26 (×5): 1 via ORAL

## 2020-04-25 NOTE — Plan of Care (Signed)
  Problem: Health Behavior/Discharge Planning: Goal: Ability to manage health-related needs will improve Outcome: Progressing   Problem: Nutrition: Goal: Adequate nutrition will be maintained Outcome: Progressing   Problem: Skin Integrity: Goal: Risk for impaired skin integrity will decrease Outcome: Progressing   

## 2020-04-25 NOTE — Plan of Care (Signed)
  Problem: Elimination: Goal: Will not experience complications related to bowel motility Outcome: Completed/Met Goal: Will not experience complications related to urinary retention Outcome: Completed/Met   Problem: Pain Managment: Goal: General experience of comfort will improve Outcome: Completed/Met   

## 2020-04-25 NOTE — Progress Notes (Signed)
Occupational Therapy Treatment Patient Details Name: Stephen Wade MRN: [id number] DOB: 1951-08-21 Today's Date: 04/25/2020    History of present illness 69 y.o. male with a past medical history that includes severe MS bedbound, chronic indwelling Foley catheter, dysphagia, paroxysmal atrial fibrillation not on anticoagulation, GERD, cholelithiasis who was admitted April 26 with acute biliary pancreatitis status post laparoscopic cholecystectomy on April 27.  Patient had persistent nausea and vomiting x-ray concerning for an ileus and an NG tube was placed on April 29.   OT comments  Pt presents supine in bed agreeable to working with therapies. Focus of session on bed mobility and sitting EOB today. Pt requiring two person assist for safe completion of mobility tasks, requiring up to totalA at times for sitting balance due to heavy posterior lean/posterior pelvic tilt. Pt requiring minA for simple grooming ADL while seated EOB. Given pt adamant to return home vs post acute rehab continue to recommend max HH services at time of discharge. Pt may be a great candidate for [facility] with [facility] if he is eligible. Will continue to follow while pt remains acutely admitted.   Follow Up Recommendations  Home health OT;Supervision/Assistance - 24 hour(may benefit from [facility] with [facility] if eligible)    Equipment Recommendations  Other (comment)(hoyer)          Precautions / Restrictions Precautions Precautions: Fall Precaution Comments: indwelling foley, JP drain Restrictions Weight Bearing Restrictions: No       Mobility Bed Mobility Overal bed mobility: Needs Assistance Bed Mobility: Rolling;Sidelying to Sit;Sit to Sidelying Rolling: Mod assist Sidelying to sit: +2 for physical assistance;Max assist     Sit to sidelying: +2 for safety/equipment;+2 for physical assistance;Max assist General bed mobility comments: Cues for technqiue for rolling; Physical assist and manual  facilitation of technique and optimal positioning of hips and knees  Transfers Overall transfer level: Needs assistance Equipment used: (bed pad) Transfers: Lateral/Scoot Transfers          Lateral/Scoot Transfers: +2 physical assistance;Total assist General transfer comment: Simulated small lateral scoot to L towards HOB in prep for laying back down; knees blocked for satbility    Balance Overall balance assessment: Needs assistance Sitting-balance support: Feet supported;Bilateral upper extremity supported Sitting balance-Leahy Scale: Poor Sitting balance - Comments: inital heavy posterior lean, and needing total assist to scoot hips at EOB for optimal base of support for sitting; briefly able to hold sitting balance a few reps (5 seconds each) Postural control: Posterior lean                                 ADL either performed or assessed with clinical judgement   ADL Overall ADL's : Needs assistance/impaired   Eating/Feeding Details (indicate cue type and reason): noted pt with wet cough after drinking water, encouraged continued coughing/clearing secretions, RN/MD made aware Grooming: Wash/dry face;Set up;Minimal assistance;Sitting Grooming Details (indicate cue type and reason): intermittent assist to wipe mouth                     Toileting- Clothing Manipulation and Hygiene: Total assistance;+2 for physical assistance;Bed level Toileting - Clothing Manipulation Details (indicate cue type and reason): pt requiring use of bed pan end of session, left on bed pan for increased time to have BM and RN in end of session to assist      Functional mobility during ADLs: Maximal assistance;+2 for physical assistance(bed mobility and sitting EOB)  General ADL Comments: pt tolerating sitting EOB with +2 assist, working on sitting balance, trunk rotation, and overall tolerance to being upright                       Cognition Arousal/Alertness:  Awake/alert Behavior During Therapy: WFL for tasks assessed/performed Overall Cognitive Status: Within Functional Limits for tasks assessed                                 General Comments: for basic tasks assessed today        Exercises Other Exercises Other Exercises: PROM to bil LEs, knee flexion, x10 Other Exercises: passive streching to heel cords bil LE Other Exercises: Lower trunk rotation in hooklying Other Exercises: trunk extension and rotation stretches in sitting  Other Exercises: Chest opening stretches in sitting   Shoulder Instructions       General Comments      Pertinent Vitals/ Pain       Pain Assessment: Faces Faces Pain Scale: Hurts a little bit Pain Location: Noted grimacing with trunk and hip stretches; reports it is not painful Pain Descriptors / Indicators: Grimacing Pain Intervention(s): Monitored during session  Home Living Family/patient expects to be discharged to:: Private residence Living Arrangements: Spouse/significant other Available Help at Discharge: Family;Available 24 hours/day Type of Home: House Home Access: Ramped entrance;Other (comment)(or stair lift)     Home Layout: Able to live on main level with bedroom/bathroom     Bathroom Shower/Tub: Occupational psychologist: Handicapped height     Home Equipment: Transport planner;Wheelchair - Education officer, community - power;Hospital bed;Tub bench;Bedside commode   Additional Comments: spouse reports have returned hoyer since last d/c home as they were not requiring use of it       Prior Functioning/Environment Level of Independence: Needs assistance  Gait / Transfers Assistance Needed: pt reports he is able to transfer to his power scooter with external assist - level of assist fluctates "based on the day", they have a slide board but pt typically transfers without - with assist of spouse or pt's aide ADL's / Homemaking Assistance Needed: pt receives assist for  bathing/dressing. Pt's aide comes for 2hrs/day, 7 days/wk to assist  Communication / Swallowing Assistance Needed: Per Hospitalist Team, Mr. Ishaq swallow function has been evaluated, and recommendations given to him multiple times, and that he declines recommendations     Frequency  Min 2X/week        Progress Toward Goals  OT Goals(current goals can now be found in the care plan section)  Progress towards OT goals: Progressing toward goals  Acute Rehab OT Goals Patient Stated Goal: "home" OT Goal Formulation: With patient Time For Goal Achievement: 05/08/20 Potential to Achieve Goals: Fair ADL Goals Pt Will Perform Grooming: with set-up;with supervision;sitting Pt Will Perform Upper Body Dressing: with min assist;sitting;bed level Pt/caregiver will Perform Home Exercise Program: Increased strength;Increased ROM;Both right and left upper extremity;With written HEP provided;With Supervision Additional ADL Goal #1: Pt will perform bed mobility with modA as precursor to EOB/OOB ADL.  Plan Discharge plan remains appropriate    Co-evaluation    PT/OT/SLP Co-Evaluation/Treatment: Yes Reason for Co-Treatment: Complexity of the patient's impairments (multi-system involvement);For patient/therapist safety;To address functional/ADL transfers PT goals addressed during session: Mobility/safety with mobility;Strengthening/ROM OT goals addressed during session: ADL's and self-care;Strengthening/ROM      AM-PAC OT "6 Clicks" Daily Activity     Outcome Measure  Help from another person eating meals?: A Lot Help from another person taking care of personal grooming?: A Little Help from another person toileting, which includes using toliet, bedpan, or urinal?: Total Help from another person bathing (including washing, rinsing, drying)?: A Lot Help from another person to put on and taking off regular upper body clothing?: A Lot Help from another person to put on and taking off regular  lower body clothing?: Total 6 Click Score: 11    End of Session    OT Visit Diagnosis: Muscle weakness (generalized) (M62.81);Other abnormalities of gait and mobility (R26.89)   Activity Tolerance Patient tolerated treatment well   Patient Left in bed;with call bell/phone within reach;with nursing/sitter in room   Nurse Communication Mobility status;Other (comment)(pt on bed pan)        Time: 2831-5176 OT Time Calculation (min): 37 min  Charges: OT General Charges $OT Visit: 1 Visit OT Treatments $Therapeutic Activity: 8-22 mins  Marcy Siren, OT Acute Rehabilitation Services Pager [phone] Office [phone]   Orlando Penner 04/25/2020, 1:50 PM

## 2020-04-25 NOTE — Progress Notes (Signed)
Initial Nutrition Assessment  DOCUMENTATION CODES:   Non-severe (moderate) malnutrition in context of chronic illness  INTERVENTION:  Boost Breeze po TID, each supplement provides 250 kcal and 9 grams of protein  Monitor for diet advancement. Once diet is advanced, recommend Ensure Enlive po TID, each supplement provides 350 kcal and 20 grams of protein.   NUTRITION DIAGNOSIS:   Moderate Malnutrition related to chronic illness(MS) as evidenced by mild fat depletion, mild muscle depletion.    GOAL:   Patient will meet greater than or equal to 90% of their needs    MONITOR:   PO intake, Supplement acceptance, Weight trends, Skin, Diet advancement, Labs  REASON FOR ASSESSMENT:   NPO/Clear Liquid Diet    ASSESSMENT:   Pt with a PMH significant of MS, dysphagia, Afib, GERD, and cholelithiasis presented with acute biliary pancreatitis s/p laparoscopic cholecystectomy and postoperative ileus.  4/29 NG placed (to suction) 5/4 NG removed  Pt reports appetite is improving and denies abdominal pain and nausea. Pt would like to receive Ensure. Discussed difference in clear liquid and full liquid diet with pt. Pt agreeable to trying Boost Breeze until diet is advanced.   Pt with potential 7.5% wt loss x4 months, which is not significant for time frame.   Per MD, pt's diet can be advanced tomorrow if he tolerates the clear liquid diet well.  UOP: 1,493ml x24 hours I/O: -1,427ml since admit  Labs: Na 150 (H) Medications reviewed and include: Dulcolax, Senokot  NUTRITION - FOCUSED PHYSICAL EXAM:    Most Recent Value  Orbital Region  No depletion  Upper Arm Region  Mild depletion  Thoracic and Lumbar Region  No depletion  Buccal Region  Mild depletion  Temple Region  Mild depletion  Clavicle Bone Region  Mild depletion  Clavicle and Acromion Bone Region  Mild depletion  Scapular Bone Region  Mild depletion  Dorsal Hand  No depletion  Patellar Region  Mild depletion   Anterior Thigh Region  Mild depletion  Posterior Calf Region  Mild depletion  Edema (RD Assessment)  None  Hair  Reviewed  Eyes  Reviewed  Mouth  Reviewed  Skin  Reviewed  Nails  Reviewed       Diet Order:   Diet Order            Diet clear liquid Room service appropriate? Yes; Fluid consistency: Thin  Diet effective now              EDUCATION NEEDS:   Education needs have been addressed  Skin:  Skin Assessment: Skin Integrity Issues: Skin Integrity Issues:: Other (Comment), Incisions Incisions: abdomen Other: MASD perineum, buttocks  Last BM:  5/4 type 2  Height:   Ht Readings from Last 1 Encounters:  04/17/20 6' (1.829 m)    Weight:   Wt Readings from Last 6 Encounters:  04/17/20 88.5 kg  12/15/19 88.5 kg  12/01/19 95.9 kg  05/29/19 95.3 kg  04/28/18 87.6 kg  04/24/18 87.5 kg    BMI:  Body mass index is 26.46 kg/m.  Estimated Nutritional Needs:   Kcal:  2000-2200  Protein:  105-115 grams  Fluid:  >/= 2L/d    Eugene Gavia, MS, RD, LDN RD pager number and weekend/on-call pager number located in [facility].

## 2020-04-25 NOTE — Progress Notes (Signed)
Progress Note    Stephen Wade  EZM:[id number] DOB: 09/03/1951  DOA: 04/17/2020 PCP: Wanda Plump, MD    Brief Narrative:    Medical records reviewed and are as summarized below:  Stephen Wade is an 69 y.o. male with a past medical history that includes severe MS bedbound, chronic indwelling Foley catheter, dysphagia, paroxysmal atrial fibrillation not on anticoagulation, GERD, cholelithiasis who was admitted April 26 with acute biliary pancreatitis status post laparoscopic cholecystectomy on April 27.  Patient had persistent nausea and vomiting x-ray concerning for an ileus and an NG tube was placed on April 29.  Assessment/Plan:   Principal Problem:   Pancreatitis Active Problems:   Multiple sclerosis (HCC)   Transaminitis   Dysphagia   URINARY CALCULUS   Spastic gait   Pressure injury of skin   Chronic indwelling Foley catheter   Hypercalcemia   Neurogenic bladder   Palliative care patient  #1.  Acute biliary pancreatitis.  Lipase was slightly elevated but CT confirmed pancreatitis.  Status post laparoscopic cholecystectomy.  Developed ileus postoperatively.  NG tube placed April 29.  NG tube clamped May 3 with minimal nausea and no vomiting.  Plan is to discontinue NG tube today and start clear liquids. -Monitor response to clear liquids -Further management per general surgery  #2.  History of dysphagia and aspiration pneumonia.  Initial imaging concerning for airspace opacities in the bilateral lower lungs right worse than left.  Patient declined being on modified diet in the past despite his understanding risks of aspiration.  Last dose of Unasyn today.  He remains afebrile hemodynamically stable and nontoxic-appearing -Discontinue Unasyn after his dose today  #3.  Chronic indwelling Foley catheter possible UTI versus asymptomatic bacteriuria.  Acute on chronic.  Chronic indwelling Foley catheter due to neurogenic bladder with a history of recurrent UTIs.  Home regimen  includes Foley being flushed twice a day with water and vinegar.  In addition his catheter is changed out weekly. -Continue home regimen -Antibiotics as noted above  #4.  Hypercalcemia.  Acute.  Resolved  #5.  Elevated LFTs.  Likely related to #1.  Resolved  #6.  Multiple sclerosis with spastic gait.  Patient significantly debilitated and known to be wheelchair/bedbound at baseline. -Continue home meds -Reposition every 2 hours  #7.  Pressure ulcer of the sacrum.  Present on admission.  Stage II. -Air mattress -Reposition every 2 hours -Appreciate wound care input  #8.  Paroxysmal atrial fibrillation.  Patient has been in sinus rhythm.  [place] Vascor 1.  Not requiring anticoagulation -Consider discontinuing telemetry  Palliative care patient chart review indicates he is on hospice but wife reports he is under palliative care  #10.  Nephrolithiasis, bladder calculi: Incidental finding on CT not obstructive    Family Communication/Anticipated D/C date and plan/Code Status   DVT prophylaxis: Lovenox ordered. Code Status: Full Code.  Family Communication: patient with questions answered Disposition Plan: Status is: Inpatient  Remains inpatient appropriate because:Inpatient level of care appropriate due to severity of illness   Dispo: The patient is from: Home              Anticipated d/c is to: Home              Anticipated d/c date is: 3 days              Patient currently is not medically stable to d/c.          Medical Consultants:  General surgery   Anti-Infectives:    Rocephin  azithromycin   Subjective:   Sitting up in bed.  reports GS indicated that NG going to be discontinued.   Denies pain/nausea/vomiting.    Objective:    Vitals:   04/24/20 0904 04/24/20 1711 04/24/20 2034 04/25/20 0448  BP: (!) 149/86 127/75 127/79 (!) 142/77  Pulse: 89 84 82 88  Resp: 18 18 18 18   Temp: (!) 97.3 F (36.3 C) (!) 97.4 F (36.3 C) (!) 97.4 F (36.3 C)  (!) 97.4 F (36.3 C)  TempSrc: Axillary Axillary Oral Oral  SpO2: 91% 93% (!) 88% 93%  Weight:      Height:        Intake/Output Summary (Last 24 hours) at 04/25/2020 0900 Last data filed at 04/25/2020 0500 Gross per 24 hour  Intake 3141.21 ml  Output 1555 ml  Net 1586.21 ml   Filed Weights   04/17/20 0726  Weight: 88.5 kg    Exam: General: Appears chronically ill, no acute distress CV: Regular rate and rhythm no murmur gallop or rub no lower extremity edema Respiratory: No increased work of breathing breath sounds are distant but clear I hear no crackles no wheezes Abdomen: Soft nondistended sluggish bowel sounds Musculoskeletal: Joints without swelling/erythema some contractures/spasms in his lower extremities Neuro: Alert oriented x3 speech clear facial symmetry  Data Reviewed:   I have personally reviewed following labs and imaging studies:  Labs: Labs show the following:   Basic Metabolic Panel: Recent Labs  Lab 04/22/20 0444 04/22/20 0444 04/23/20 0519 04/23/20 0519 04/23/20 1528 04/23/20 1528 04/24/20 0623 04/25/20 0437  NA 153*  --  156*  --  156*  --  157* 150*  K 3.1*   < > 2.7*   < > 3.7   < > 3.8 3.7  CL 105  --  104  --  104  --  105 107  CO2 37*  --  36*  --  36*  --  37* 33*  GLUCOSE 82  --  76  --  78  --  68* 91  BUN 19  --  18  --  16  --  18 17  CREATININE 0.98  --  0.92  --  0.90  --  1.01 0.84  CALCIUM 9.4  --  9.5  --  9.5  --  9.3 8.9  MG  --   --  1.7  --   --   --   --   --    < > = values in this interval not displayed.   GFR Estimated Creatinine Clearance: 93.7 mL/min (by C-G formula based on SCr of 0.84 mg/dL). Liver Function Tests: Recent Labs  Lab 04/19/20 0434 04/20/20 0520 04/21/20 0625 04/22/20 0444 04/23/20 0519  AST 60* 46* 21 22 25   ALT 59* 44 30 26 25   ALKPHOS 158* 140* 117 105 113  BILITOT 0.6 1.4* 0.9 0.6 1.1  PROT 6.6 6.2* 5.5* 5.6* 5.8*  ALBUMIN 3.2* 3.0* 2.4* 2.5* 2.5*   Recent Labs  Lab 04/21/20 0625    LIPASE 20   No results for input(s): AMMONIA in the last 168 hours. Coagulation profile No results for input(s): INR, PROTIME in the last 168 hours.  CBC: Recent Labs  Lab 04/21/20 0625 04/22/20 0444 04/23/20 0519 04/24/20 0623 04/25/20 0437  WBC 14.2* 10.8* 8.7 8.6 8.6  NEUTROABS  --   --  7.0 6.6 6.3  HGB 12.9* 12.4* 13.0 12.9* 11.6*  HCT  40.4 38.7* 40.7 41.2 36.6*  MCV 91.8 93.3 92.3 94.5 93.6  PLT 230 239 259 281 245   Cardiac Enzymes: No results for input(s): CKTOTAL, CKMB, CKMBINDEX, TROPONINI in the last 168 hours. BNP (last 3 results) No results for input(s): PROBNP in the last 8760 hours. CBG: No results for input(s): GLUCAP in the last 168 hours. D-Dimer: No results for input(s): DDIMER in the last 72 hours. Hgb A1c: No results for input(s): HGBA1C in the last 72 hours. Lipid Profile: No results for input(s): CHOL, HDL, LDLCALC, TRIG, CHOLHDL, LDLDIRECT in the last 72 hours. Thyroid function studies: No results for input(s): TSH, T4TOTAL, T3FREE, THYROIDAB in the last 72 hours.  Invalid input(s): FREET3 Anemia work up: No results for input(s): VITAMINB12, FOLATE, FERRITIN, TIBC, IRON, RETICCTPCT in the last 72 hours. Sepsis Labs: Recent Labs  Lab 04/22/20 0444 04/23/20 0519 04/24/20 0623 04/25/20 0437  WBC 10.8* 8.7 8.6 8.6    Microbiology Recent Results (from the past 240 hour(s))  Urine Culture     Status: Abnormal   Collection Time: 04/17/20 11:42 AM   Specimen: Urine, Catheterized  Result Value Ref Range Status   Specimen Description URINE, CATHETERIZED  Final   Special Requests   Final    NONE Performed at [facility], [address], [place], [place] [address]    Culture (A)  Final    >=100,000 COLONIES/mL STAPHYLOCOCCUS AUREUS 20,000 COLONIES/mL ENTEROCOCCUS FAECALIS VANCOMYCIN RESISTANT ENTEROCOCCUS ISOLATED    Report Status 04/20/2020 FINAL  Final   Organism ID, Bacteria STAPHYLOCOCCUS AUREUS (A)  Final   Organism ID,  Bacteria ENTEROCOCCUS FAECALIS (A)  Final      Susceptibility   Enterococcus faecalis - MIC*    AMPICILLIN <=2 SENSITIVE Sensitive     NITROFURANTOIN <=16 SENSITIVE Sensitive     VANCOMYCIN >=32 RESISTANT Resistant     LINEZOLID 2 SENSITIVE Sensitive     * 20,000 COLONIES/mL ENTEROCOCCUS FAECALIS   Staphylococcus aureus - MIC*    CIPROFLOXACIN >=8 RESISTANT Resistant     GENTAMICIN <=0.5 SENSITIVE Sensitive     NITROFURANTOIN <=16 SENSITIVE Sensitive     OXACILLIN <=0.25 SENSITIVE Sensitive     TETRACYCLINE <=1 SENSITIVE Sensitive     VANCOMYCIN <=0.5 SENSITIVE Sensitive     TRIMETH/SULFA <=10 SENSITIVE Sensitive     CLINDAMYCIN <=0.25 SENSITIVE Sensitive     RIFAMPIN <=0.5 SENSITIVE Sensitive     Inducible Clindamycin NEGATIVE Sensitive     * >=100,000 COLONIES/mL STAPHYLOCOCCUS AUREUS  Respiratory Panel by RT PCR (Flu A&B, Covid) - Nasopharyngeal Swab     Status: None   Collection Time: 04/17/20  1:56 PM   Specimen: Nasopharyngeal Swab  Result Value Ref Range Status   SARS Coronavirus 2 by RT PCR NEGATIVE NEGATIVE Final    Comment: (NOTE) SARS-CoV-2 target nucleic acids are NOT DETECTED. The SARS-CoV-2 RNA is generally detectable in upper respiratoy specimens during the acute phase of infection. The lowest concentration of SARS-CoV-2 viral copies this assay can detect is 131 copies/mL. A negative result does not preclude SARS-Cov-2 infection and should not be used as the sole basis for treatment or other patient management decisions. A negative result may occur with  improper specimen collection/handling, submission of specimen other than nasopharyngeal swab, presence of viral mutation(s) within the areas targeted by this assay, and inadequate number of viral copies (<131 copies/mL). A negative result must be combined with clinical observations, patient history, and epidemiological information. The expected result is Negative. Fact Sheet for Patients:  PinkCheek.be Fact Sheet for Healthcare Providers:  GravelBags.it This test is not yet ap proved or cleared by the [place] FDA and  has been authorized for detection and/or diagnosis of SARS-CoV-2 by FDA under an Emergency Use Authorization (EUA). This EUA will remain  in effect (meaning this test can be used) for the duration of the COVID-19 declaration under Section 564(b)(1) of the Act, 21 U.S.C. section 360bbb-3(b)(1), unless the authorization is terminated or revoked sooner.    Influenza A by PCR NEGATIVE NEGATIVE Final   Influenza B by PCR NEGATIVE NEGATIVE Final    Comment: (NOTE) The Xpert Xpress SARS-CoV-2/FLU/RSV assay is intended as an aid in  the diagnosis of influenza from Nasopharyngeal swab specimens and  should not be used as a sole basis for treatment. Nasal washings and  aspirates are unacceptable for Xpert Xpress SARS-CoV-2/FLU/RSV  testing. Fact Sheet for Patients: PinkCheek.be Fact Sheet for Healthcare Providers: GravelBags.it This test is not yet approved or cleared by the [place] FDA and  has been authorized for detection and/or diagnosis of SARS-CoV-2 by  FDA under an Emergency Use Authorization (EUA). This EUA will remain  in effect (meaning this test can be used) for the duration of the  Covid-19 declaration under Section 564(b)(1) of the Act, 21  U.S.C. section 360bbb-3(b)(1), unless the authorization is  terminated or revoked. Performed at [facility], [place] [address] [place], [place] [address]   Surgical pcr screen     Status: Abnormal   Collection Time: 04/17/20 11:11 PM   Specimen: Nasal Mucosa; Nasal Swab  Result Value Ref Range Status   MRSA, PCR NEGATIVE NEGATIVE Final   Staphylococcus aureus POSITIVE (A) NEGATIVE Final    Comment: (NOTE) The Xpert SA Assay (FDA approved for NASAL specimens in patients 73 years  of age and older), is one component of a comprehensive surveillance program. It is not intended to diagnose infection nor to guide or monitor treatment. Performed at [facility], [place] [address] [place], [place] [address]     Procedures and diagnostic studies:  No results found.  Medications:   . chlorhexidine  15 mL Mouth Rinse BID  . Chlorhexidine Gluconate Cloth  6 each Topical Daily  . enoxaparin (LOVENOX) injection  40 mg Subcutaneous Q24H  . Gerhardt's butt cream   Topical TID  . mouth rinse  15 mL Mouth Rinse q12n4p  . senna-docusate  1 tablet Oral BID  . sodium chloride flush  3 mL Intravenous Q12H   Continuous Infusions: . potassium chloride    . sodium chloride 0.45 % 1,000 mL with potassium chloride 40 mEq infusion 125 mL/hr at 04/24/20 2140     LOS: 8 days   Radene Gunning NP  [facility]   How to contact the [facility] Attending or Consulting provider [place] or covering provider during after hours [place], for this patient?  1. Check the care team in [facility] and look for a) attending/consulting [facility] provider listed and b) the [facility] team listed 2. Log into [email or web address] and use [place]'s universal password to access. If you do not have the password, please contact the hospital operator. 3. Locate the [facility] provider you are looking for under [facility] and page to a number that you can be directly reached. 4. If you still have difficulty reaching the provider, please page the [facility] (Director on Call) for the Hospitalists listed on amion for assistance.  04/25/2020, 9:00 AM

## 2020-04-25 NOTE — Progress Notes (Signed)
Patient ID: Stephen Wade, male   DOB: August 26, 1951, 69 y.o.   MRN: [id number]    7 Days Post-Op  Subjective: Wanting to be repositioned, but otherwise still passing flatus.  Tolerated NGT clamping over the last 24 hrs with no nausea.  Unsure if he has had a BM or not.  Does not feel bloated.  ROS: See above, otherwise other systems negative  Objective: Vital signs in last 24 hours: Temp:  [97.3 F (36.3 C)-97.4 F (36.3 C)] 97.4 F (36.3 C) (05/04 0448) Pulse Rate:  [82-89] 88 (05/04 0448) Resp:  [18] 18 (05/04 0448) BP: (127-149)/(75-86) 142/77 (05/04 0448) SpO2:  [88 %-93 %] 93 % (05/04 0448) Last BM Date: 04/20/20  Intake/Output from previous day: 05/03 0701 - 05/04 0700 In: 3391.2 [P.O.:60; I.V.:2695.2] Out: 0347 [Urine:1400; Emesis/NG output:1150; Drains:35] Intake/Output this shift: No intake/output data recorded.  PE: Abd: soft, +BS, NT, incisions c/d/i, NGT clamped, JP drain with minimal output  Lab Results:  Recent Labs    04/24/20 0623 04/25/20 0437  WBC 8.6 8.6  HGB 12.9* 11.6*  HCT 41.2 36.6*  PLT 281 245   BMET Recent Labs    04/24/20 0623 04/25/20 0437  NA 157* 150*  K 3.8 3.7  CL 105 107  CO2 37* 33*  GLUCOSE 68* 91  BUN 18 17  CREATININE 1.01 0.84  CALCIUM 9.3 8.9   PT/INR No results for input(s): LABPROT, INR in the last 72 hours. CMP     Component Value Date/Time   NA 150 (H) 04/25/2020 0437   NA 137 02/01/2020 0000   K 3.7 04/25/2020 0437   CL 107 04/25/2020 0437   CO2 33 (H) 04/25/2020 0437   GLUCOSE 91 04/25/2020 0437   BUN 17 04/25/2020 0437   BUN 20 02/01/2020 0000   CREATININE 0.84 04/25/2020 0437   CALCIUM 8.9 04/25/2020 0437   PROT 5.8 (L) 04/23/2020 0519   PROT 7.3 03/08/2020 1628   ALBUMIN 2.5 (L) 04/23/2020 0519   ALBUMIN 4.4 03/08/2020 1628   AST 25 04/23/2020 0519   ALT 25 04/23/2020 0519   ALKPHOS 113 04/23/2020 0519   BILITOT 1.1 04/23/2020 0519   BILITOT 0.4 03/08/2020 1628   GFRNONAA >60 04/25/2020 0437   GFRAA >60 04/25/2020 0437   Lipase     Component Value Date/Time   LIPASE 20 04/21/2020 0625       Studies/Results: No results found.  Anti-infectives: Anti-infectives (From admission, onward)   Start     Dose/Rate Route Frequency Ordered Stop   04/20/20 0800  Ampicillin-Sulbactam (UNASYN) 3 g in sodium chloride 0.9 % 100 mL IVPB     3 g 200 mL/hr over 30 Minutes Intravenous Every 6 hours 04/19/20 1118 04/23/20 2137   04/19/20 1130  Ampicillin-Sulbactam (UNASYN) 3 g in sodium chloride 0.9 % 100 mL IVPB  Status:  Discontinued     3 g 200 mL/hr over 30 Minutes Intravenous Every 6 hours 04/19/20 1117 04/19/20 1118   04/18/20 1000  cefTRIAXone (ROCEPHIN) 2 g in sodium chloride 0.9 % 100 mL IVPB  Status:  Discontinued     2 g 200 mL/hr over 30 Minutes Intravenous Every 24 hours 04/17/20 1437 04/19/20 1117   04/18/20 1000  azithromycin (ZITHROMAX) 500 mg in sodium chloride 0.9 % 250 mL IVPB  Status:  Discontinued     500 mg 250 mL/hr over 60 Minutes Intravenous Every 24 hours 04/17/20 1437 04/19/20 1117   04/17/20 1145  cefTRIAXone (ROCEPHIN) 1 g in sodium  chloride 0.9 % 100 mL IVPB     1 g 200 mL/hr over 30 Minutes Intravenous  Once 04/17/20 1141 04/17/20 1415   04/17/20 1145  azithromycin (ZITHROMAX) 500 mg in sodium chloride 0.9 % 250 mL IVPB     500 mg 250 mL/hr over 60 Minutes Intravenous  Once 04/17/20 1141 04/17/20 1902       Assessment/Plan MS Stenosis of right common iliac noted on CT UTI in setting of chronic indwelling Foley Aspiration PNA - Above per TRH -   Biliary Pancreatitis Acute on Chronic Cholecystitis  S/p Lap Chole, Dr. Magnus Ivan, 04/18/2020, POD #7 -hgb down slightly, but no further significant bleeding from his JP drain -NGT to be DC today and give clear liquids and see how he does -K up to 3.7.  Ideally like it to be closer to 4. -mobilize as able  FEN - DC NGT/CLD VTE -SCDs, Lovenox ID -Rocephin/Azithromycin4/26-5/2. Unasyn 4/28 (for  asp PNA - Per TRH) Foley-chronicindwelling  Follow-Up - TBD   LOS: 8 days    Letha Cape , [facility] 04/25/2020, 8:24 AM Please see Amion for pager number during day hours 7:00am-4:30pm or 7:00am -11:30am on weekends

## 2020-04-25 NOTE — Progress Notes (Signed)
Patient rested last night. Denies pain. Some discomfort in abdomen. JP intact. Old dried drainage noted. On overlay mattress. Reposition every two hours.

## 2020-04-25 NOTE — Evaluation (Signed)
Physical Therapy Evaluation Patient Details Name: Stephen Wade MRN: [id number] DOB: September 27, 1951 Today's Date: 04/25/2020   History of Present Illness  69 y.o. male with a past medical history that includes severe MS bedbound, chronic indwelling Foley catheter, dysphagia, paroxysmal atrial fibrillation not on anticoagulation, GERD, cholelithiasis who was admitted April 26 with acute biliary pancreatitis status post laparoscopic cholecystectomy on April 27.  Patient had persistent nausea and vomiting x-ray concerning for an ileus and an NG tube was placed on April 29.NG tube dc'd 5/4  Clinical Impression   Pt admitted with above diagnosis. Comes from home where he lives with wife in single level home with a stair lift to enter; Uses a scooter or wheelchair for mobility, has assist for trnasfers -- level of assist varies per patient; Presents to PT with significant bil LE weakness; abnormalities of tone, poor trunk mobility, poor balance;  Pt requiring two person assist for safe completion of mobility tasks, requiring up to totalA at times for sitting balance due to heavy posterior lean/posterior pelvic tilt.  Performed trunk rotation and chest opening stretches sitting EOB; Pt currently with functional limitations due to the deficits listed below (see PT Problem List). Pt will benefit from skilled PT to increase their independence and safety with mobility to allow discharge to the venue listed below.       Follow Up Recommendations Home health PT;Supervision/Assistance - 24 hour;Other (comment)(Consider [employer] [facility])    Equipment Recommendations  Other (comment)(hoyer lift; ambulance or medical Zenaida Niece transport home)    Recommendations for Other Services       Precautions / Restrictions Precautions Precautions: Fall Precaution Comments: indwelling foley, JP drain Restrictions Weight Bearing Restrictions: No      Mobility  Bed Mobility Overal bed mobility: Needs Assistance Bed  Mobility: Rolling;Sidelying to Sit;Sit to Sidelying Rolling: Mod assist Sidelying to sit: +2 for physical assistance;Max assist     Sit to sidelying: +2 for safety/equipment;+2 for physical assistance;Max assist General bed mobility comments: Cues for technqiue for rolling; Physical assist and manual facilitation of technique and optimal positioning of hips and knees  Transfers Overall transfer level: Needs assistance Equipment used: (bed pad) Transfers: Lateral/Scoot Transfers          Lateral/Scoot Transfers: +2 physical assistance;Total assist General transfer comment: Simulated small lateral scoot to L towards HOB in prep for laying back down; knees blocked for satbility  Ambulation/Gait                Stairs            Wheelchair Mobility    Modified Rankin (Stroke Patients Only)       Balance Overall balance assessment: Needs assistance Sitting-balance support: Feet supported;Bilateral upper extremity supported Sitting balance-Leahy Scale: Poor Sitting balance - Comments: inital heavy posterior lean, and needing total assist to scoot hips at EOB for optimal base of support for sitting; briefly able to hold sitting balance a few reps (5 seconds each) Postural control: Posterior lean                                   Pertinent Vitals/Pain Pain Assessment: Faces Faces Pain Scale: Hurts a little bit Pain Location: Noted grimacing with trunk and hip stretches; reports it is not painful Pain Descriptors / Indicators: Grimacing Pain Intervention(s): Monitored during session    Home Living Family/patient expects to be discharged to:: Private residence Living Arrangements: Spouse/significant other  Available Help at Discharge: Family;Available 24 hours/day Type of Home: House Home Access: Ramped entrance;Other (comment)(or stair lift)     Home Layout: Able to live on main level with bedroom/bathroom Home Equipment: Doctor, hospital;Wheelchair - Education officer, community - power;Hospital bed;Tub bench;Bedside commode Additional Comments: spouse reports have returned hoyer since last d/c home as they were not requiring use of it     Prior Function Level of Independence: Needs assistance   Gait / Transfers Assistance Needed: pt reports he is able to transfer to his power scooter with external assist - level of assist fluctates "based on the day", they have a slide board but pt typically transfers without - with assist of spouse or pt's aide  ADL's / Homemaking Assistance Needed: pt receives assist for bathing/dressing. Pt's aide comes for 2hrs/day, 7 days/wk to assist         Hand Dominance   Dominant Hand: Right    Extremity/Trunk Assessment   Upper Extremity Assessment Upper Extremity Assessment: Defer to OT evaluation    Lower Extremity Assessment Lower Extremity Assessment: RLE deficits/detail;LLE deficits/detail RLE Deficits / Details: P/ROM hip, knee, ankle grossly WFL for bed mobility; Increased tone, with notable resistance to PROM throughout range LLE Deficits / Details: P/ROM grossly WFL hip, knee, ankle; Voluntary activation and moveemnt noted; hip flexion 3-/5, knee extension 3-/5 (tested in supine)       Communication   Communication: Expressive difficulties  Cognition Arousal/Alertness: Awake/alert Behavior During Therapy: WFL for tasks assessed/performed Overall Cognitive Status: Within Functional Limits for tasks assessed                                 General Comments: for basic tasks assessed today      General Comments      Exercises Other Exercises Other Exercises: PROM to bil LEs, knee flexion, x10 Other Exercises: passive streching to heel cords bil LE Other Exercises: Lower trunk rotation in hooklying Other Exercises: trunk extension and rotation stretches in sitting  Other Exercises: Chest opening stretches in sitting   Assessment/Plan    PT Assessment  Patient needs continued PT services  PT Problem List Decreased strength;Decreased range of motion;Decreased activity tolerance;Decreased balance;Decreased mobility;Decreased coordination;Decreased knowledge of use of DME;Decreased safety awareness;Decreased knowledge of precautions;Impaired sensation;Impaired tone       PT Treatment Interventions DME instruction;Functional mobility training;Therapeutic activities;Therapeutic exercise;Balance training;Neuromuscular re-education;Cognitive remediation;Patient/family education;Wheelchair mobility training    PT Goals (Current goals can be found in the Care Plan section)  Acute Rehab PT Goals Patient Stated Goal: "home" PT Goal Formulation: With patient Time For Goal Achievement: 05/09/20 Potential to Achieve Goals: Fair    Frequency Min 3X/week   Barriers to discharge        Co-evaluation PT/OT/SLP Co-Evaluation/Treatment: Yes Reason for Co-Treatment: Complexity of the patient's impairments (multi-system involvement);For patient/therapist safety;To address functional/ADL transfers PT goals addressed during session: Mobility/safety with mobility;Strengthening/ROM OT goals addressed during session: ADL's and self-care;Strengthening/ROM       AM-PAC PT "6 Clicks" Mobility  Outcome Measure Help needed turning from your back to your side while in a flat bed without using bedrails?: A Lot Help needed moving from lying on your back to sitting on the side of a flat bed without using bedrails?: A Lot Help needed moving to and from a bed to a chair (including a wheelchair)?: Total Help needed standing up from a chair using your arms (e.g., wheelchair or bedside chair)?: Total Help  needed to walk in hospital room?: Total Help needed climbing 3-5 steps with a railing? : Total 6 Click Score: 8    End of Session Equipment Utilized During Treatment: Other (comment)(bed pad) Activity Tolerance: Patient tolerated treatment well Patient left: in  bed;with call bell/phone within reach;with nursing/sitter in room;Other (comment)(pt having BM on bedpan) Nurse Communication: Mobility status;Other (comment);Need for lift equipment(on bedpan) PT Visit Diagnosis: Other abnormalities of gait and mobility (R26.89);Muscle weakness (generalized) (M62.81)    Time: 7782-4235 PT Time Calculation (min) (ACUTE ONLY): 42 min   Charges:   PT Evaluation $PT Eval Moderate Complexity: 1 Mod PT Treatments $Therapeutic Exercise: 8-22 mins        Van Clines, PT  Acute Rehabilitation Services Pager [phone] Office [phone]   Levi Aland 04/25/2020, 2:07 PM

## 2020-04-26 DIAGNOSIS — E440 Moderate protein-calorie malnutrition: Secondary | ICD-10-CM | POA: Diagnosis not present

## 2020-04-26 DIAGNOSIS — K567 Ileus, unspecified: Secondary | ICD-10-CM

## 2020-04-26 DIAGNOSIS — K851 Biliary acute pancreatitis without necrosis or infection: Secondary | ICD-10-CM | POA: Diagnosis not present

## 2020-04-26 DIAGNOSIS — R1310 Dysphagia, unspecified: Secondary | ICD-10-CM | POA: Diagnosis not present

## 2020-04-26 LAB — CBC WITH DIFFERENTIAL/PLATELET
Abs Immature Granulocytes: 0.2 10*3/uL — ABNORMAL HIGH (ref 0.00–0.07)
Basophils Absolute: 0.1 10*3/uL (ref 0.0–0.1)
Basophils Relative: 1 %
Eosinophils Absolute: 0.4 10*3/uL (ref 0.0–0.5)
Eosinophils Relative: 4 %
HCT: 30.2 % — ABNORMAL LOW (ref 39.0–52.0)
Hemoglobin: 9.8 g/dL — ABNORMAL LOW (ref 13.0–17.0)
Immature Granulocytes: 2 %
Lymphocytes Relative: 13 %
Lymphs Abs: 1.2 10*3/uL (ref 0.7–4.0)
MCH: 30 pg (ref 26.0–34.0)
MCHC: 32.5 g/dL (ref 30.0–36.0)
MCV: 92.4 fL (ref 80.0–100.0)
Monocytes Absolute: 0.7 10*3/uL (ref 0.1–1.0)
Monocytes Relative: 8 %
Neutro Abs: 6.8 10*3/uL (ref 1.7–7.7)
Neutrophils Relative %: 72 %
Platelets: 243 10*3/uL (ref 150–400)
RBC: 3.27 MIL/uL — ABNORMAL LOW (ref 4.22–5.81)
RDW: 15.9 % — ABNORMAL HIGH (ref 11.5–15.5)
WBC: 9.3 10*3/uL (ref 4.0–10.5)
nRBC: 0 % (ref 0.0–0.2)

## 2020-04-26 LAB — BASIC METABOLIC PANEL
Anion gap: 8 (ref 5–15)
BUN: 17 mg/dL (ref 8–23)
CO2: 24 mmol/L (ref 22–32)
Calcium: 8.5 mg/dL — ABNORMAL LOW (ref 8.9–10.3)
Chloride: 109 mmol/L (ref 98–111)
Creatinine, Ser: 0.65 mg/dL (ref 0.61–1.24)
GFR calc Af Amer: >60 mL/min (ref >60)
GFR calc non Af Amer: >60 mL/min (ref >60)
Glucose, Bld: 87 mg/dL (ref 70–99)
Potassium: 4.2 mmol/L (ref 3.5–5.1)
Sodium: 141 mmol/L (ref 135–145)

## 2020-04-26 MED ORDER — PANTOPRAZOLE SODIUM 40 MG PO TBEC
40.0000 mg | DELAYED_RELEASE_TABLET | Freq: Every day | ORAL | Status: DC
  Administered 2020-04-26: 40 mg via ORAL
  Filled 2020-04-26 (×2): qty 1

## 2020-04-26 NOTE — Discharge Instructions (Signed)
[facility]  Please arrive at least 30 min before your appointment to complete your check in paperwork.  If you are unable to arrive 30 min prior to your appointment time we may have to cancel or reschedule you. LAPAROSCOPIC SURGERY: POST OP INSTRUCTIONS Always review your discharge instruction sheet given to you by the facility where your surgery was performed. IF YOU HAVE DISABILITY OR FAMILY LEAVE FORMS, YOU MUST BRING THEM TO THE OFFICE FOR PROCESSING.   DO NOT GIVE THEM TO YOUR DOCTOR.  PAIN CONTROL  1. First take acetaminophen (Tylenol) AND/or ibuprofen (Advil) to control your pain after surgery.  Follow directions on package.  Taking acetaminophen (Tylenol) and/or ibuprofen (Advil) regularly after surgery will help to control your pain and lower the amount of prescription pain medication you may need.  You should not take more than 4,000 mg (4 grams) of acetaminophen (Tylenol) in 24 hours.  You should not take ibuprofen (Advil), aleve, motrin, naprosyn or other NSAIDS if you have a history of stomach ulcers or chronic kidney disease.  2. A prescription for pain medication may be given to you upon discharge.  Take your pain medication as prescribed, if you still have uncontrolled pain after taking acetaminophen (Tylenol) or ibuprofen (Advil). 3. Use ice packs to help control pain. 4. If you need a refill on your pain medication, please contact your pharmacy.  They will contact our office to request authorization. Prescriptions will not be filled after 5pm or on week-ends.  HOME MEDICATIONS 5. Take your usually prescribed medications unless otherwise directed.  DIET 6. You should follow a light diet the first few days after arrival home.  Be sure to include lots of fluids daily. Avoid fatty, fried foods.   CONSTIPATION 7. It is common to experience some constipation after surgery and if you are taking pain medication.  Increasing fluid intake and taking a stool  softener (such as Colace) will usually help or prevent this problem from occurring.  A mild laxative (Milk of Magnesia or Miralax) should be taken according to package instructions if there are no bowel movements after 48 hours.  WOUND/INCISION CARE 8. Most patients will experience some swelling and bruising in the area of the incisions.  Ice packs will help.  Swelling and bruising can take several days to resolve.  9. Unless discharge instructions indicate otherwise, follow guidelines below  a. STERI-STRIPS - you may remove your outer bandages 48 hours after surgery, and you may shower at that time.  You have steri-strips (small skin tapes) in place directly over the incision.  These strips should be left on the skin for 7-10 days.   b. DERMABOND/SKIN GLUE - you may shower in 24 hours.  The glue will flake off over the next 2-3 weeks. 10. Any sutures or staples will be removed at the office during your follow-up visit.  ACTIVITIES 11. You may resume regular (light) daily activities beginning the next day--such as daily self-care, walking, climbing stairs--gradually increasing activities as tolerated.  You may have sexual intercourse when it is comfortable.  Refrain from any heavy lifting or straining until approved by your doctor. a. You may drive when you are no longer taking prescription pain medication, you can comfortably wear a seatbelt, and you can safely maneuver your car and apply brakes.  FOLLOW-UP 12. You should see your doctor in the office for a follow-up appointment approximately 2-3 weeks after your surgery.  You should have been given your post-op/follow-up appointment when   your surgery was scheduled.  If you did not receive a post-op/follow-up appointment, make sure that you call for this appointment within a day or two after you arrive home to insure a convenient appointment time.   WHEN TO CALL YOUR DOCTOR: 1. Fever over 101.0 2. Inability to urinate 3. Continued bleeding from  incision. 4. Increased pain, redness, or drainage from the incision. 5. Increasing abdominal pain  The clinic staff is available to answer your questions during regular business hours.  Please don't hesitate to call and ask to speak to one of the nurses for clinical concerns.  If you have a medical emergency, go to the nearest emergency room or call 911.  A surgeon from [facility] is always on call at the hospital. [address], [address], [place], [place]  [address] ? [address] [address], [place], [place]   [address] [phone] ? [phone] ? FAX [phone]  .........   Managing Your Pain After Surgery Without Opioids    Thank you for participating in our program to help patients manage their pain after surgery without opioids. This is part of our effort to provide you with the best care possible, without exposing you or your family to the risk that opioids pose.  What pain can I expect after surgery? You can expect to have some pain after surgery. This is normal. The pain is typically worse the day after surgery, and quickly begins to get better. Many studies have found that many patients are able to manage their pain after surgery with Over-the-Counter (OTC) medications such as Tylenol and Motrin. If you have a condition that does not allow you to take Tylenol or Motrin, notify your surgical team.  How will I manage my pain? The best strategy for controlling your pain after surgery is around the clock pain control with Tylenol (acetaminophen) and Motrin (ibuprofen or Advil). Alternating these medications with each other allows you to maximize your pain control. In addition to Tylenol and Motrin, you can use heating pads or ice packs on your incisions to help reduce your pain.  How will I alternate your regular strength over-the-counter pain medication? You will take a dose of pain medication every three hours. ; Start by taking 650 mg of Tylenol (2 pills of 325  mg) ; 3 hours later take 600 mg of Motrin (3 pills of 200 mg) ; 3 hours after taking the Motrin take 650 mg of Tylenol ; 3 hours after that take 600 mg of Motrin.   - 1 -  See example - if your first dose of Tylenol is at 12:00 PM   12:00 PM Tylenol 650 mg (2 pills of 325 mg)  3:00 PM Motrin 600 mg (3 pills of 200 mg)  6:00 PM Tylenol 650 mg (2 pills of 325 mg)  9:00 PM Motrin 600 mg (3 pills of 200 mg)  Continue alternating every 3 hours   We recommend that you follow this schedule around-the-clock for at least 3 days after surgery, or until you feel that it is no longer needed. Use the table on the last page of this handout to keep track of the medications you are taking. Important: Do not take more than 3000mg of Tylenol or 3200mg of Motrin in a 24-hour period. Do not take ibuprofen/Motrin if you have a history of bleeding stomach ulcers, severe kidney disease, &/or actively taking a blood thinner  What if I still have pain? If you have pain that is not   controlled with the over-the-counter pain medications (Tylenol and Motrin or Advil) you might have what we call "breakthrough" pain. You will receive a prescription for a small amount of an opioid pain medication such as Oxycodone, Tramadol, or Tylenol with Codeine. Use these opioid pills in the first 24 hours after surgery if you have breakthrough pain. Do not take more than 1 pill every 4-6 hours.  If you still have uncontrolled pain after using all opioid pills, don't hesitate to call our staff using the number provided. We will help make sure you are managing your pain in the best way possible, and if necessary, we can provide a prescription for additional pain medication.   Day 1    Time  Name of Medication Number of pills taken  Amount of Acetaminophen  Pain Level   Comments  AM PM       AM PM       AM PM       AM PM       AM PM       AM PM       AM PM       AM PM       Total Daily amount of Acetaminophen Do not  take more than  3,000 mg per day      Day 2    Time  Name of Medication Number of pills taken  Amount of Acetaminophen  Pain Level   Comments  AM PM       AM PM       AM PM       AM PM       AM PM       AM PM       AM PM       AM PM       Total Daily amount of Acetaminophen Do not take more than  3,000 mg per day      Day 3    Time  Name of Medication Number of pills taken  Amount of Acetaminophen  Pain Level   Comments  AM PM       AM PM       AM PM       AM PM          AM PM       AM PM       AM PM       AM PM       Total Daily amount of Acetaminophen Do not take more than  3,000 mg per day      Day 4    Time  Name of Medication Number of pills taken  Amount of Acetaminophen  Pain Level   Comments  AM PM       AM PM       AM PM       AM PM       AM PM       AM PM       AM PM       AM PM       Total Daily amount of Acetaminophen Do not take more than  3,000 mg per day      Day 5    Time  Name of Medication Number of pills taken  Amount of Acetaminophen  Pain Level   Comments  AM PM       AM PM       AM   PM       AM PM       AM PM       AM PM       AM PM       AM PM       Total Daily amount of Acetaminophen Do not take more than  3,000 mg per day       Day 6    Time  Name of Medication Number of pills taken  Amount of Acetaminophen  Pain Level  Comments  AM PM       AM PM       AM PM       AM PM       AM PM       AM PM       AM PM       AM PM       Total Daily amount of Acetaminophen Do not take more than  3,000 mg per day      Day 7    Time  Name of Medication Number of pills taken  Amount of Acetaminophen  Pain Level   Comments  AM PM       AM PM       AM PM       AM PM       AM PM       AM PM       AM PM       AM PM       Total Daily amount of Acetaminophen Do not take more than  3,000 mg per day        For additional information about how and where to safely dispose of unused  opioid medications - https://www.morepowerfulnc.org  Disclaimer: This document contains information and/or instructional materials adapted from [facility] for the typical patient with your condition. It does not replace medical advice from your health care provider because your experience may differ from that of the typical patient. Talk to your health care provider if you have any questions about this document, your condition or your treatment plan. Adapted from [facility]      JP Drain Totals  Bring this sheet to all of your post-operative appointments while you have your drains.  Please measure your drains by CC's or ML's.  Make sure you drain and measure your JP Drains 3 times per day.  At the end of each day, add up totals for the left side and add up totals for the right side.    ( 9 am )     ( 3 pm )        ( 9 pm )                Date L  R  L  R  L  R  Total L/R                                                                                                                                                                                       

## 2020-04-26 NOTE — Progress Notes (Signed)
Progress Note    [name]  QIW:[id number] DOB: 10-04-1951  DOA: 04/17/2020 PCP: Stephen Plump, MD    Brief Narrative:     Medical records reviewed and are as summarized below:  Stephen Wade is an 69 y.o. malewith a past medical history that includes severe MS bedbound, chronic indwelling Foley catheter, dysphagia, paroxysmal atrial fibrillation not on anticoagulation, GERD, cholelithiasis who was admitted April 26 with acute biliary pancreatitis status post laparoscopic cholecystectomy on April 27. Patient had persistent nausea and vomiting x-ray concerning for an ileus and an NG tube was placed on April 29. NG tube removed May 4 provided with clear liquids.  Tolerated well.  Diet advanced to May 5  Assessment/Plan:   Principal Problem:   Pancreatitis Active Problems:   Multiple sclerosis (HCC)   Transaminitis   Dysphagia   URINARY CALCULUS   Spastic gait   Pressure injury of skin   Chronic indwelling Foley catheter   Hypercalcemia   Neurogenic bladder   Palliative care patient   Malnutrition of moderate degree  #1.  Acute biliary pancreatitis.  Status post laparoscopic cholecystectomy.  Developed ileus postoperatively.  NG tube placed April 29.  NG tube clamped May 3 with minimal nausea and no vomiting.  NG removed May 4.  Provided clear liquids which he tolerated well.  Evaluated by general surgery today who opted to advance his diet and remove his JP drain. -Monitor response advancement of diet -Further management per general surgery -Hopeful discharge tomorrow  #2.  History of dysphagia and aspiration pneumonia.  Initial imaging concerning for airspace opacities in the bilateral lower lungs right worse than left.  Patient declined being on modified diet in the past despite his understanding risks of aspiration.  Last dose of Unasyn May 4.  He remains afebrile hemodynamically stable and nontoxic-appearing  #3.  Chronic indwelling Foley catheter possible UTI versus  asymptomatic bacteriuria.  Acute on chronic.  Chronic indwelling Foley catheter due to neurogenic bladder with a history of recurrent UTIs.  Home regimen includes Foley being flushed twice a day with water and vinegar.  In addition his catheter is changed out weekly. -Continue home regimen -Antibiotics as noted above  #4.  Hypercalcemia.  Acute.  Resolved  #5.  Elevated LFTs.  Likely related to #1.  Resolved  #6.  Multiple sclerosis with spastic gait.  Patient significantly debilitated and known to be wheelchair/bedbound at baseline. -Continue home meds -Reposition every 2 hours -Palliative care for review/management of goals of care  #7.  Pressure ulcer of the sacrum.  Present on admission.  Stage II. -Air mattress -Reposition every 2 hours -Appreciate wound care input  #8.  Paroxysmal atrial fibrillation.  Patient has been in sinus rhythm.  [place] Vascor 1.  Not requiring anticoagulation -Consider discontinuing telemetry  Palliative care patient chart review indicates he is on hospice but wife reports he is under palliative care.  Recommend close outpatient follow-up for review of goals of care  #10.  Nephrolithiasis, bladder calculi: Incidental finding on CT not obstructive    Family Communication/Anticipated D/C date and plan/Code Status   DVT prophylaxis: Lovenox ordered. Code Status: Full Code.  Family Communication: wife at bedside Disposition Plan: Status is: Inpatient  Remains inpatient appropriate because:Inpatient level of care appropriate due to severity of illness   Dispo: The patient is from: Home              Anticipated d/c is to: Home  Anticipated d/c date is: 1 day              Patient currently is not medically stable to d/c.          Medical Consultants:    General surgery   Anti-Infectives:    Rocephin  Azithromycin  Unasyn  Subjective:   States he wants to go home. Reports "I am just about finished with all of  this"  Objective:    Vitals:   04/25/20 1715 04/25/20 2050 04/26/20 0519 04/26/20 1004  BP: 132/78 119/71 120/78 133/73  Pulse: 82 88 89 85  Resp: 18 18 18 18   Temp: 97.8 F (36.6 C) 98 F (36.7 C) 98.9 F (37.2 C) (!) 97.5 F (36.4 C)  TempSrc: Oral  Oral Oral  SpO2: 98% 94% 98% 94%  Weight:      Height:        Intake/Output Summary (Last 24 hours) at 04/26/2020 1347 Last data filed at 04/26/2020 1000 Gross per 24 hour  Intake 2985.93 ml  Output 2225 ml  Net 760.93 ml   Filed Weights   04/17/20 0726  Weight: 88.5 kg    Exam: General: Appears chronically ill, no acute distress CV: Regular rate and rhythm no murmur gallop or rub no lower extremity edema Respiratory: No increased work of breathing breath sounds are distant but clear I hear no crackles no wheezes Abdomen: Soft nondistended sluggish bowel sounds Musculoskeletal: Joints without swelling/erythema some contractures/spasms in his lower extremities Neuro: Alert oriented x3 speech clear facial symmetry  Data Reviewed:   I have personally reviewed following labs and imaging studies:  Labs: Labs show the following:   Basic Metabolic Panel: Recent Labs  Lab 04/23/20 0519 04/23/20 0519 04/23/20 1528 04/23/20 1528 04/24/20 0623 04/24/20 0623 04/25/20 0437 04/26/20 0459  NA 156*  --  156*  --  157*  --  150* 141  K 2.7*   < > 3.7   < > 3.8   < > 3.7 4.2  CL 104  --  104  --  105  --  107 109  CO2 36*  --  36*  --  37*  --  33* 24  GLUCOSE 76  --  78  --  68*  --  91 87  BUN 18  --  16  --  18  --  17 17  CREATININE 0.92  --  0.90  --  1.01  --  0.84 0.65  CALCIUM 9.5  --  9.5  --  9.3  --  8.9 8.5*  MG 1.7  --   --   --   --   --   --   --    < > = values in this interval not displayed.   GFR Estimated Creatinine Clearance: 98.3 mL/min (by C-G formula based on SCr of 0.65 mg/dL). Liver Function Tests: Recent Labs  Lab 04/20/20 0520 04/21/20 0625 04/22/20 0444 04/23/20 0519  AST 46* 21 22 25     ALT 44 30 26 25   ALKPHOS 140* 117 105 113  BILITOT 1.4* 0.9 0.6 1.1  PROT 6.2* 5.5* 5.6* 5.8*  ALBUMIN 3.0* 2.4* 2.5* 2.5*   Recent Labs  Lab 04/21/20 0625  LIPASE 20   No results for input(s): AMMONIA in the last 168 hours. Coagulation profile No results for input(s): INR, PROTIME in the last 168 hours.  CBC: Recent Labs  Lab 04/22/20 0444 04/23/20 0519 04/24/20 0623 04/25/20 0437 04/26/20 0459  WBC 10.8*  8.7 8.6 8.6 9.3  NEUTROABS  --  7.0 6.6 6.3 6.8  HGB 12.4* 13.0 12.9* 11.6* 9.8*  HCT 38.7* 40.7 41.2 36.6* 30.2*  MCV 93.3 92.3 94.5 93.6 92.4  PLT 239 259 281 245 243   Cardiac Enzymes: No results for input(s): CKTOTAL, CKMB, CKMBINDEX, TROPONINI in the last 168 hours. BNP (last 3 results) No results for input(s): PROBNP in the last 8760 hours. CBG: No results for input(s): GLUCAP in the last 168 hours. D-Dimer: No results for input(s): DDIMER in the last 72 hours. Hgb A1c: No results for input(s): HGBA1C in the last 72 hours. Lipid Profile: No results for input(s): CHOL, HDL, LDLCALC, TRIG, CHOLHDL, LDLDIRECT in the last 72 hours. Thyroid function studies: No results for input(s): TSH, T4TOTAL, T3FREE, THYROIDAB in the last 72 hours.  Invalid input(s): FREET3 Anemia work up: No results for input(s): VITAMINB12, FOLATE, FERRITIN, TIBC, IRON, RETICCTPCT in the last 72 hours. Sepsis Labs: Recent Labs  Lab 04/23/20 0519 04/24/20 0623 04/25/20 0437 04/26/20 0459  WBC 8.7 8.6 8.6 9.3    Microbiology Recent Results (from the past 240 hour(s))  Urine Culture     Status: Abnormal   Collection Time: 04/17/20 11:42 AM   Specimen: Urine, Catheterized  Result Value Ref Range Status   Specimen Description URINE, CATHETERIZED  Final   Special Requests   Final    NONE Performed at [facility], [address] [place], [place] [address]    Culture (A)  Final    >=100,000 COLONIES/mL STAPHYLOCOCCUS AUREUS 20,000 COLONIES/mL ENTEROCOCCUS  FAECALIS VANCOMYCIN RESISTANT ENTEROCOCCUS ISOLATED    Report Status 04/20/2020 FINAL  Final   Organism ID, Bacteria STAPHYLOCOCCUS AUREUS (A)  Final   Organism ID, Bacteria ENTEROCOCCUS FAECALIS (A)  Final      Susceptibility   Enterococcus faecalis - MIC*    AMPICILLIN <=2 SENSITIVE Sensitive     NITROFURANTOIN <=16 SENSITIVE Sensitive     VANCOMYCIN >=32 RESISTANT Resistant     LINEZOLID 2 SENSITIVE Sensitive     * 20,000 COLONIES/mL ENTEROCOCCUS FAECALIS   Staphylococcus aureus - MIC*    CIPROFLOXACIN >=8 RESISTANT Resistant     GENTAMICIN <=0.5 SENSITIVE Sensitive     NITROFURANTOIN <=16 SENSITIVE Sensitive     OXACILLIN <=0.25 SENSITIVE Sensitive     TETRACYCLINE <=1 SENSITIVE Sensitive     VANCOMYCIN <=0.5 SENSITIVE Sensitive     TRIMETH/SULFA <=10 SENSITIVE Sensitive     CLINDAMYCIN <=0.25 SENSITIVE Sensitive     RIFAMPIN <=0.5 SENSITIVE Sensitive     Inducible Clindamycin NEGATIVE Sensitive     * >=100,000 COLONIES/mL STAPHYLOCOCCUS AUREUS  Respiratory Panel by RT PCR (Flu A&B, Covid) - Nasopharyngeal Swab     Status: None   Collection Time: 04/17/20  1:56 PM   Specimen: Nasopharyngeal Swab  Result Value Ref Range Status   SARS Coronavirus 2 by RT PCR NEGATIVE NEGATIVE Final    Comment: (NOTE) SARS-CoV-2 target nucleic acids are NOT DETECTED. The SARS-CoV-2 RNA is generally detectable in upper respiratoy specimens during the acute phase of infection. The lowest concentration of SARS-CoV-2 viral copies this assay can detect is 131 copies/mL. A negative result does not preclude SARS-Cov-2 infection and should not be used as the sole basis for treatment or other patient management decisions. A negative result may occur with  improper specimen collection/handling, submission of specimen other than nasopharyngeal swab, presence of viral mutation(s) within the areas targeted by this assay, and inadequate number of viral copies (<131 copies/mL). A  negative result must be  combined with clinical observations, patient history, and epidemiological information. The expected result is Negative. Fact Sheet for Patients:  https://www.moore.com/ Fact Sheet for Healthcare Providers:  https://www.young.biz/ This test is not yet ap proved or cleared by the [place] FDA and  has been authorized for detection and/or diagnosis of SARS-CoV-2 by FDA under an Emergency Use Authorization (EUA). This EUA will remain  in effect (meaning this test can be used) for the duration of the COVID-19 declaration under Section 564(b)(1) of the Act, 21 U.S.C. section 360bbb-3(b)(1), unless the authorization is terminated or revoked sooner.    Influenza A by PCR NEGATIVE NEGATIVE Final   Influenza B by PCR NEGATIVE NEGATIVE Final    Comment: (NOTE) The Xpert Xpress SARS-CoV-2/FLU/RSV assay is intended as an aid in  the diagnosis of influenza from Nasopharyngeal swab specimens and  should not be used as a sole basis for treatment. Nasal washings and  aspirates are unacceptable for Xpert Xpress SARS-CoV-2/FLU/RSV  testing. Fact Sheet for Patients: https://www.moore.com/ Fact Sheet for Healthcare Providers: https://www.young.biz/ This test is not yet approved or cleared by the [place] FDA and  has been authorized for detection and/or diagnosis of SARS-CoV-2 by  FDA under an Emergency Use Authorization (EUA). This EUA will remain  in effect (meaning this test can be used) for the duration of the  Covid-19 declaration under Section 564(b)(1) of the Act, 21  U.S.C. section 360bbb-3(b)(1), unless the authorization is  terminated or revoked. Performed at [facility], [address], [place], [place] [address]   Surgical pcr screen     Status: Abnormal   Collection Time: 04/17/20 11:11 PM   Specimen: Nasal Mucosa; Nasal Swab  Result Value Ref Range Status   MRSA, PCR NEGATIVE NEGATIVE Final    Staphylococcus aureus POSITIVE (A) NEGATIVE Final    Comment: (NOTE) The Xpert SA Assay (FDA approved for NASAL specimens in patients 62 years of age and older), is one component of a comprehensive surveillance program. It is not intended to diagnose infection nor to guide or monitor treatment. Performed at [facility], [address], [place], [place] [address]     Procedures and diagnostic studies:  No results found.  Medications:   . bisacodyl  10 mg Rectal Once  . chlorhexidine  15 mL Mouth Rinse BID  . Chlorhexidine Gluconate Cloth  6 each Topical Daily  . enoxaparin (LOVENOX) injection  40 mg Subcutaneous Q24H  . feeding supplement  1 Container Oral TID BM  . Gerhardt's butt cream   Topical TID  . mouth rinse  15 mL Mouth Rinse q12n4p  . senna-docusate  1 tablet Oral BID  . sodium chloride flush  3 mL Intravenous Q12H   Continuous Infusions: . sodium chloride 0.45 % 1,000 mL with potassium chloride 40 mEq infusion 125 mL/hr at 04/26/20 1100     LOS: 9 days   Gwenyth Bender NP  [facility]   How to contact the [facility] Attending or Consulting provider 7A - 7P or covering provider during after hours 7P -7A, for this patient?  1. Check the care team in [facility] and look for a) attending/consulting TRH provider listed and b) the [facility] team listed 2. Log into [email or web address] and use [place]'s universal password to access. If you do not have the password, please contact the hospital operator. 3. Locate the [facility] provider you are looking for under [facility] and page to a number that you can be  directly reached. 4. If you still have difficulty reaching the provider, please page the [facility] (Director on Call) for the Hospitalists listed on amion for assistance.  04/26/2020, 1:47 PM

## 2020-04-26 NOTE — Progress Notes (Signed)
Orthopedic Tech Progress Note Patient Details:  Stephen Wade [birth year] [id number]  Ortho Devices Type of Ortho Device: Knee Immobilizer Ortho Device/Splint Location: RLE Ortho Device/Splint Interventions: Application   Post Interventions Patient Tolerated: Well Instructions Provided: Adjustment of device   Stephen Wade 04/26/2020, 9:21 AM

## 2020-04-26 NOTE — Telephone Encounter (Signed)
Received fax from [place] one to one that pt has been approved to receive Aubagio from PAP 04/24/2020-12/22/2020.

## 2020-04-26 NOTE — Consult Note (Signed)
   [facility] Inpatient Consult   04/26/2020  Stephen Wade 21-Dec-1951 [id number]  [facility] ACO Patient:  Medicare NextGen  Call received from inpatient Transition of Care [facility], Wendi, regarding patient being active with [facility] and [facility] Management services are needed.  Review of patient's medical record reveals patient is admitted with Acute Pancreatitis with s/p Laparoscopic Cholecystectomy.  Patient has home health as well per TOC RNCM.  Primary Care Provider is Kathlene November, MD with [facility], this office is listed to provide the transition of care follow up call and visit. Plan:  Spoke with Joann with [facility] [CC] who is aware of disposition and needs.  Confirmed that their social worker in the [facility] program can follow.  Patient's needs to be met currently at this level of care assessed.  For questions contact:   Natividad Brood, RN BSN [facility] Liaison  [phone] business mobile phone Toll free office [phone]  Fax number: [phone] [email or web address]

## 2020-04-26 NOTE — TOC Progression Note (Signed)
Transition of Care [facility]) - Progression Note    Patient Details  Name: Stephen Wade MRN: [id number] Date of Birth: April 10, 1951  [facility]) CM/SW Contact  Bess Kinds, RN Phone Number: [phone] 04/26/2020, 12:05 PM  Clinical Narrative:     Spoke with spouse at the bedside to discuss transition planning. Anticipate transition home tomorrow. Will need hoyer lift. Referral sent to [facility]. DME order placed. HH orders placed for RN, PT, OT - [facility] notified. Spoke with liaison for [facility] - patient is active with care connections for palliative care, and [facility] to refer for social work as well. Patient will need ambulance transport at discharge. TOC following for transition needs.   Expected Discharge Plan: Home w Home Health Services Barriers to Discharge: Continued Medical Work up  Expected Discharge Plan and Services Expected Discharge Plan: Home w Home Health Services   Discharge Planning Services: CM Consult Post Acute Care Choice: Home Health Living arrangements for the past 2 months: Single Family Home                 DME Arranged: N/A DME Agency: NA       HH Arranged: RN, PT, OT HH Agency: [facility] Date HH Agency Contacted: 04/24/20 Time HH Agency Contacted: 1429 Representative spoke with at [facility] Agency: Kathlene November   Social Determinants of Health (SDOH) Interventions    Readmission Risk Interventions No flowsheet data found.

## 2020-04-26 NOTE — Progress Notes (Signed)
8 Days Post-Op  Subjective: Patient wants to go home today.  States he "needs" to go home.  Says he has lived a good life and is ready to go home, whether he is medically ready or not.  States that if he has the option to leave AMA that he may if we don't let him.  Had a BM yesterday.  No nausea with CLD.  ROS: See above, otherwise other systems negative  Objective: Vital signs in last 24 hours: Temp:  [97.6 F (36.4 C)-98.9 F (37.2 C)] 98.9 F (37.2 C) (05/05 0519) Pulse Rate:  [82-89] 89 (05/05 0519) Resp:  [18] 18 (05/05 0519) BP: (119-132)/(71-78) 120/78 (05/05 0519) SpO2:  [94 %-98 %] 98 % (05/05 0519) Last BM Date: 04/25/20  Intake/Output from previous day: 05/04 0701 - 05/05 0700 In: 4060.9 [P.O.:954; I.V.:2569.5; IV Piggyback:534.4] Out: 2301 [Urine:2300; Stool:1] Intake/Output this shift: No intake/output data recorded.  PE: Abd: soft, appropriately tender, incisions are c/d/i, ND, +BS, JP drain still with frankly bloody output, but only about 10cc currently.  Unsure how much over the last 24 hrs, hasn't been documented  Lab Results:  Recent Labs    04/25/20 0437 04/26/20 0459  WBC 8.6 9.3  HGB 11.6* 9.8*  HCT 36.6* 30.2*  PLT 245 243   BMET Recent Labs    04/25/20 0437 04/26/20 0459  NA 150* 141  K 3.7 4.2  CL 107 109  CO2 33* 24  GLUCOSE 91 87  BUN 17 17  CREATININE 0.84 0.65  CALCIUM 8.9 8.5*   PT/INR No results for input(s): LABPROT, INR in the last 72 hours. CMP     Component Value Date/Time   NA 141 04/26/2020 0459   NA 137 02/01/2020 0000   K 4.2 04/26/2020 0459   CL 109 04/26/2020 0459   CO2 24 04/26/2020 0459   GLUCOSE 87 04/26/2020 0459   BUN 17 04/26/2020 0459   BUN 20 02/01/2020 0000   CREATININE 0.65 04/26/2020 0459   CALCIUM 8.5 (L) 04/26/2020 0459   PROT 5.8 (L) 04/23/2020 0519   PROT 7.3 03/08/2020 1628   ALBUMIN 2.5 (L) 04/23/2020 0519   ALBUMIN 4.4 03/08/2020 1628   AST 25 04/23/2020 0519   ALT 25 04/23/2020  0519   ALKPHOS 113 04/23/2020 0519   BILITOT 1.1 04/23/2020 0519   BILITOT 0.4 03/08/2020 1628   GFRNONAA >60 04/26/2020 0459   GFRAA >60 04/26/2020 0459   Lipase     Component Value Date/Time   LIPASE 20 04/21/2020 0625       Studies/Results: No results found.  Anti-infectives: Anti-infectives (From admission, onward)   Start     Dose/Rate Route Frequency Ordered Stop   04/20/20 0800  Ampicillin-Sulbactam (UNASYN) 3 g in sodium chloride 0.9 % 100 mL IVPB     3 g 200 mL/hr over 30 Minutes Intravenous Every 6 hours 04/19/20 1118 04/23/20 2137   04/19/20 1130  Ampicillin-Sulbactam (UNASYN) 3 g in sodium chloride 0.9 % 100 mL IVPB  Status:  Discontinued     3 g 200 mL/hr over 30 Minutes Intravenous Every 6 hours 04/19/20 1117 04/19/20 1118   04/18/20 1000  cefTRIAXone (ROCEPHIN) 2 g in sodium chloride 0.9 % 100 mL IVPB  Status:  Discontinued     2 g 200 mL/hr over 30 Minutes Intravenous Every 24 hours 04/17/20 1437 04/19/20 1117   04/18/20 1000  azithromycin (ZITHROMAX) 500 mg in sodium chloride 0.9 % 250 mL IVPB  Status:  Discontinued  500 mg 250 mL/hr over 60 Minutes Intravenous Every 24 hours 04/17/20 1437 04/19/20 1117   04/17/20 1145  cefTRIAXone (ROCEPHIN) 1 g in sodium chloride 0.9 % 100 mL IVPB     1 g 200 mL/hr over 30 Minutes Intravenous  Once 04/17/20 1141 04/17/20 1415   04/17/20 1145  azithromycin (ZITHROMAX) 500 mg in sodium chloride 0.9 % 250 mL IVPB     500 mg 250 mL/hr over 60 Minutes Intravenous  Once 04/17/20 1141 04/17/20 1902       Assessment/Plan MS Stenosis of right common iliac noted on CT UTI in setting of chronic indwelling Foley Aspiration PNA - Above per TRH -   Biliary Pancreatitis Acute on Chronic Cholecystitis  S/p Lap Chole, Dr. Magnus Ivan, 04/18/2020, POD #8 -hgb down again 2 g from yesterday.  JP drain with bloody output, but not enough that would account for a 2 g drop.  Will continue to monitor this.  Had planned to DC drain  today, but may keep and follow hgb one more day if patient stays. -adv to regular diet -K 4.2 today   FEN - regular diet VTE -SCDs, Lovenox ID -Rocephin/Azithromycin4/26-5/2. Unasyn 4/28 (for asp PNA - Per TRH) Foley-chronicindwelling  Follow-Up - TBD   LOS: 9 days    Letha Cape , [facility] Surgery 04/26/2020, 8:28 AM Please see Amion for pager number during day hours 7:00am-4:30pm or 7:00am -11:30am on weekends

## 2020-04-26 NOTE — Progress Notes (Signed)
JP drain removed with no issues.  Dry dressing placed.  Letha Cape 11:52 AM 04/26/2020

## 2020-04-27 DIAGNOSIS — K567 Ileus, unspecified: Secondary | ICD-10-CM

## 2020-04-27 DIAGNOSIS — Z7189 Other specified counseling: Secondary | ICD-10-CM

## 2020-04-27 DIAGNOSIS — E440 Moderate protein-calorie malnutrition: Secondary | ICD-10-CM | POA: Diagnosis not present

## 2020-04-27 DIAGNOSIS — Z515 Encounter for palliative care: Secondary | ICD-10-CM

## 2020-04-27 DIAGNOSIS — F5000 Anorexia nervosa, unspecified: Secondary | ICD-10-CM

## 2020-04-27 DIAGNOSIS — J690 Pneumonitis due to inhalation of food and vomit: Secondary | ICD-10-CM

## 2020-04-27 DIAGNOSIS — G35 Multiple sclerosis: Secondary | ICD-10-CM | POA: Diagnosis not present

## 2020-04-27 DIAGNOSIS — K851 Biliary acute pancreatitis without necrosis or infection: Secondary | ICD-10-CM | POA: Diagnosis not present

## 2020-04-27 DIAGNOSIS — Z66 Do not resuscitate: Secondary | ICD-10-CM

## 2020-04-27 LAB — CBC WITH DIFFERENTIAL/PLATELET
Abs Immature Granulocytes: 0.21 10*3/uL — ABNORMAL HIGH (ref 0.00–0.07)
Basophils Absolute: 0.1 10*3/uL (ref 0.0–0.1)
Basophils Relative: 1 %
Eosinophils Absolute: 0.3 10*3/uL (ref 0.0–0.5)
Eosinophils Relative: 4 %
HCT: 30 % — ABNORMAL LOW (ref 39.0–52.0)
Hemoglobin: 9.7 g/dL — ABNORMAL LOW (ref 13.0–17.0)
Immature Granulocytes: 2 %
Lymphocytes Relative: 9 %
Lymphs Abs: 0.9 10*3/uL (ref 0.7–4.0)
MCH: 29.8 pg (ref 26.0–34.0)
MCHC: 32.3 g/dL (ref 30.0–36.0)
MCV: 92.3 fL (ref 80.0–100.0)
Monocytes Absolute: 0.7 10*3/uL (ref 0.1–1.0)
Monocytes Relative: 7 %
Neutro Abs: 7.4 10*3/uL (ref 1.7–7.7)
Neutrophils Relative %: 77 %
Platelets: 249 10*3/uL (ref 150–400)
RBC: 3.25 MIL/uL — ABNORMAL LOW (ref 4.22–5.81)
RDW: 16 % — ABNORMAL HIGH (ref 11.5–15.5)
WBC: 9.6 10*3/uL (ref 4.0–10.5)
nRBC: 0.2 % (ref 0.0–0.2)

## 2020-04-27 MED ORDER — BOOST / RESOURCE BREEZE PO LIQD CUSTOM: 1.0000 | ORAL | Status: DC

## 2020-04-27 MED ORDER — ENSURE ENLIVE PO LIQD: 237.0000 mL | Freq: Two times a day (BID) | ORAL | Status: DC

## 2020-04-27 NOTE — TOC Transition Note (Addendum)
Transition of Care [facility]) - CM/SW Discharge Note   Patient Details  Name: Stephen Wade MRN: [id number] Date of Birth: 1951-09-13  [facility] Care [facility]) CM/SW Contact:  Bess Kinds, RN Phone Number: [phone] 04/27/2020, 1:00 PM   Clinical Narrative:     Received notification of patient and wife desire to admit to [facility] for home hospice. Spoke with liaison at [facility]. Patient is good for discharge home today. [facility] to admit patient to services at 10 am tomorrow. Provider notified. PTAR arranged. Spouse notified. Medical transport paperwork and DNR on chart. [facility] notified that patient is electing hospice at this time. No further TOC needs.   Final next level of care: Home w Hospice Care Barriers to Discharge: No Barriers Identified   Patient Goals and CMS Choice Patient states their goals for this hospitalization and ongoing recovery are:: home today CMS Medicare.gov Compare Post Acute Care list provided to:: Patient Choice offered to / list presented to : Patient, Spouse  Discharge Placement                       Discharge Plan and Services   Discharge Planning Services: CM Consult Post Acute Care Choice: Home Health          DME Arranged: N/A DME Agency: NA       HH Arranged: RN HH Agency: [facility] Date HH Agency Contacted: 04/27/20 Time HH Agency Contacted: 1230 Representative spoke with at [facility] Agency: Cherie  Social Determinants of Health (SDOH) Interventions     Readmission Risk Interventions No flowsheet data found.

## 2020-04-27 NOTE — Progress Notes (Signed)
[facility]: [employer]  Referral received for hospice care at home. Discussed with pt's wife. There goals are in line with hospice p[hilosphy. He has been approved for hospice care and they are in agreement with proceeding with enrollment when he gets home. He is a DNR.  They have in home a hospital bed which they own. And a hoyer lift has been ordered for delivery today by CM at hospital. She does not feel they need any other equipment.  Norm Parcel [phone]

## 2020-04-27 NOTE — Plan of Care (Signed)
  Problem: Clinical Measurements: Goal: Will remain free from infection Outcome: Completed/Met Goal: Respiratory complications will improve Outcome: Completed/Met Goal: Cardiovascular complication will be avoided Outcome: Completed/Met   

## 2020-04-27 NOTE — Progress Notes (Signed)
PT Cancellation Note  Patient Details Name: Stephen Wade MRN: [id number] DOB: 30-Jan-1951   Cancelled Treatment:    Reason Eval/Treat Not Completed: Patient declined, no reason specified. Pt and pt's wife reporting that they are d/c'ing home today and declining PT at this time.    Alessandra Bevels Elen Acero 04/27/2020, 2:51 PM

## 2020-04-27 NOTE — TOC Progression Note (Signed)
Transition of Care [facility]) - Progression Note    Patient Details  Name: Stephen Wade MRN: [id number] Date of Birth: 1951/02/11  Transition of Care [facility] CM/SW Contact  Bess Kinds, RN Phone Number: [phone] 04/27/2020, 10:36 AM  Clinical Narrative:     Spoke with spouse, Verlon Au, on the phone. She anticipates hoyer lift to be delivered today. Discussed HH services with [facility] to include weekly RN visit, PT 3x week, and OT 2x week. Discussed palliative services through [facility] including social worker. Patient will need ambulance transport home. TOC team following for transition needs.   Expected Discharge Plan: Home w Home Health Services Barriers to Discharge: Continued Medical Work up  Expected Discharge Plan and Services Expected Discharge Plan: Home w Home Health Services   Discharge Planning Services: CM Consult Post Acute Care Choice: Home Health Living arrangements for the past 2 months: Single Family Home                 DME Arranged: N/A DME Agency: NA       HH Arranged: RN, PT, OT HH Agency: [facility] Date HH Agency Contacted: 04/24/20 Time HH Agency Contacted: 1429 Representative spoke with at [facility] Agency: Kathlene November   Social Determinants of Health (SDOH) Interventions    Readmission Risk Interventions No flowsheet data found.

## 2020-04-27 NOTE — Progress Notes (Signed)
DISCHARGE NOTE HOME Stephen DIANA to be discharged to home per MD order. Discussed prescriptions and follow up appointments with the patient's wife.. Prescriptions given to patient's ; medication list explained in detail. Patient's  verbalized understanding.  Skin clean, dry and intact without evidence of skin break down, no evidence of skin tears noted. IV catheter discontinued intact. Site without signs and symptoms of complications. Dressing and pressure applied. Pt denies pain at the site currently. No complaints noted.  Patient free of lines, drains, and wounds.Patient is going home with foley catheter.   An After Visit Summary (AVS) was printed and given to the patient. Patient escorted via EMS, and discharged home.  [place], Kem Kays, RN

## 2020-04-27 NOTE — Consult Note (Signed)
Consultation Note Date: 04/27/2020   Patient Name: Stephen Wade  DOB: 1951-05-20  MRN: [id number]  Age / Sex: 69 y.o., male  PCP: Colon Branch, MD Referring Physician: Geradine Girt, DO  Reason for Consultation: Establishing goals of care  HPI/Patient Profile: 69 y.o. male  with past medical history of MS since 2005, wheelchair bound, chronic foley, a fib, GERD, and dysphagia admitted on 04/17/2020 with abdominal pain. Diagnosed with acute biliary pancreatitis and had cholecystectomy. Patient now making statements that he is "done" and refusing meds and nourishment. PMT consulted by primary team for [place].  Clinical Assessment and Goals of Care: I have reviewed medical records including EPIC notes, labs and imaging, received report from Dr. Eliseo Squires, assessed the patient and then met with patient and his wife  to discuss diagnosis prognosis, GOC, EOL wishes, disposition and options.  I introduced Palliative Medicine as specialized medical care for people living with serious illness. It focuses on providing relief from the symptoms and stress of a serious illness. The goal is to improve quality of life for both the patient and the family.  Patient and his wife have been married over 52 years. He ran a Oceanographer. He has 3 children - 2 in [place] and 1 in [place] - he sees them often.   Patient immediately tells me "I have no quality of life". He tells me he is tired of aggressive medical management - does not want to come to the hospital anymore. He tells me he does not want his life prolonged. He tells me he is ready to pass on. He is tired of being dependent on other for all of his care.   We discuss that we cannot hasten death but we can support his body through the natural dying process and opt for medical treatment that optimizes comfort and quality of life instead of focusing on prolonging life.   We discuss his symptoms - he denies pain,  nausea, agitation, anxiety, dyspnea. Tells me he feels okay other than just being "really tired". He tells me food no longer tastes good and he does not want to eat - we discussed possibility of medication side effect/not enjoying types of foods provided at hospital. He has lost 30 pounds since his hospitalization in Jan.   He tells me he wants to go home today - we discuss types of support at home. He confirms that he does not want to proceed with any therapy - PT/OT/SLP. We discuss options of hospice care. Philosophy of care discussed in detail.   We discuss his code status - wife tells me his thoughts on this fluctuate - he confirms he does not want resuscitation attempts. We discuss this is appropriate even if he were not ready for full comfort care as his chance of surviving resuscitation is small and would worsen his already (self described) poor quality of life.   Discussed with patient/family the importance of continued conversation with family and the medical providers regarding overall plan of care and treatment options, ensuring decisions are within the context of the patient's values and GOCs.    Patient's wife participated in this conversation and agrees with hospice care at home and code status change to DNR.   Questions and concerns were addressed. The family was encouraged to call with questions or concerns.   Primary Decision Maker NEXT OF KIN    SUMMARY OF RECOMMENDATIONS   - home with hospice care (hopeful for today) - code status changed to  DNR - denies symptoms, comfortable other than being tired  Code Status/Advance Care Planning:  DNR  Additional Recommendations (Limitations, Scope, Preferences):  Full Comfort Care  Psycho-social/Spiritual:   Desire for further Chaplaincy support:no  Additional Recommendations: Education on Hospice  Prognosis:   Depends on PO intake - if he continues to refuse all PO intake < 2 weeks  Discharge Planning: Home with Hospice       Primary Diagnoses: Present on Admission: . Pancreatitis . URINARY CALCULUS . Spastic gait . Multiple sclerosis ([place]) . Transaminitis . Neurogenic bladder . Pressure injury of skin . Hypercalcemia   I have reviewed the medical record, interviewed the patient and family, and examined the patient. The following aspects are pertinent.  Past Medical History:  Diagnosis Date  . GERD (gastroesophageal reflux disease)    had on- off chest pain, symptoms resolved with Prilosec 11/2010  . Lower urinary tract symptoms (LUTS)    Dr Gaynelle Arabian  . MS (multiple sclerosis) ([place]) 2005  . Testicular hypogonadism   . Urge incontinence of urine   . Urolithiasis 2011   Social History   Socioeconomic History  . Marital status: Married    Spouse name: Not on file  . Number of children: 3  . Years of education: Not on file  . Highest education level: Not on file  Occupational History  . Occupation: still works some from home    Employer: [employer]  Tobacco Use  . Smoking status: Former Research scientist (life sciences)  . Smokeless tobacco: Never Used  . Tobacco comment: 3 ppd, quit 1999  Substance and Sexual Activity  . Alcohol use: No    Alcohol/week: 0.0 standard drinks    Comment: rare  . Drug use: No  . Sexual activity: Not on file  Other Topics Concern  . Not on file  Social History Narrative   Lives w/ wife    Social Determinants of Health   Financial Resource Strain:   . Difficulty of Paying Living Expenses:   Food Insecurity:   . Worried About Charity fundraiser in the Last Year:   . Arboriculturist in the Last Year:   Transportation Needs:   . Film/video editor (Medical):   Marland Kitchen Lack of Transportation (Non-Medical):   Physical Activity:   . Days of Exercise per Week:   . Minutes of Exercise per Session:   Stress:   . Feeling of Stress :   Social Connections:   . Frequency of Communication with Friends and Family:   . Frequency of Social Gatherings with Friends and  Family:   . Attends Religious Services:   . Active Member of Clubs or Organizations:   . Attends Archivist Meetings:   Marland Kitchen Marital Status:    Family History  Problem Relation Age of Onset  . Heart attack Mother   . Brain cancer Father   . Colon cancer Neg Hx   . Prostate cancer Neg Hx    Scheduled Meds: . bisacodyl  10 mg Rectal Once  . chlorhexidine  15 mL Mouth Rinse BID  . Chlorhexidine Gluconate Cloth  6 each Topical Daily  . enoxaparin (LOVENOX) injection  40 mg Subcutaneous Q24H  . feeding supplement  1 Container Oral TID BM  . Gerhardt's butt cream   Topical TID  . mouth rinse  15 mL Mouth Rinse q12n4p  . pantoprazole  40 mg Oral Daily  . senna-docusate  1 tablet Oral BID  . sodium chloride flush  3 mL Intravenous  Q12H   Continuous Infusions: PRN Meds:.acetaminophen **OR** acetaminophen, albuterol, baclofen, fentaNYL (SUBLIMAZE) injection, ibuprofen, loperamide, ondansetron **OR** ondansetron (ZOFRAN) IV, traMADol No Known Allergies Review of Systems  Constitutional: Positive for activity change, appetite change, fatigue and unexpected weight change.    Physical Exam Constitutional:      General: He is not in acute distress. Pulmonary:     Effort: Pulmonary effort is normal. No respiratory distress.  Skin:    General: Skin is warm and dry.  Neurological:     Mental Status: He is alert and oriented to person, place, and time.     Vital Signs: BP 134/82 (BP Location: Right Arm)   Pulse 97   Temp 97.9 F (36.6 C) (Oral)   Resp 18   Ht 6' (1.829 m)   Wt 88.5 kg   SpO2 95%   BMI 26.46 kg/m  Pain Scale: 0-10   Pain Score: 0-No pain   SpO2: SpO2: 95 % O2 Device:SpO2: 95 % O2 Flow Rate: .   IO: Intake/output summary:   Intake/Output Summary (Last 24 hours) at 04/27/2020 1243 Last data filed at 04/27/2020 1006 Gross per 24 hour  Intake 340 ml  Output 3175 ml  Net -2835 ml    LBM: Last BM Date: 04/25/20 Baseline Weight: Weight: 88.5 kg Most  recent weight: Weight: (Bed scale broken)     Palliative Assessment/Data: PPS 20%    Time Total: 70 minutes Greater than 50%  of this time was spent counseling and coordinating care related to the above assessment and plan.  Juel Burrow, DNP, AGNP-C Palliative Medicine Team [phone] Pager: [phone]

## 2020-04-27 NOTE — Discharge Summary (Addendum)
Physician Discharge Summary  Stephen Wade [id number] DOB: Jul 11, 1951 DOA: 04/17/2020  PCP: Wanda Plump, MD  Admit date: 04/17/2020 Discharge date: 04/27/2020  Admitted From: home Discharge disposition: home   Recommendations for Outpatient Follow-Up:   Discharge to home with hospice.  Follow-up with general surgery 3 weeks.  They will make arrangements.   Discharge Diagnosis:   Principal Problem:   Pancreatitis Active Problems:   Multiple sclerosis (HCC)   Transaminitis   Dysphagia   URINARY CALCULUS   Spastic gait   Pressure injury of skin   Chronic indwelling Foley catheter   Hypercalcemia   Neurogenic bladder   Palliative care patient   Malnutrition of moderate degree   Ileus (HCC)    Discharge Condition: Improved.  Diet recommendation: Regular.  Wound care: None.  Code status: DNR   History of Present Illness:   Stephen Wade is a 69 y.o. male with medical history significant of severe MS bedbound with chronic indwelling Foley catheter, cholelithiasis, paroxysmal atrial fibrillation not on anticoagulation, and GERD presented 4/26 with complaints of abdominal pain. History  obtained from the patient with the assistance of his wife.  Last hospitalized with sepsis secondary to group B strep UTI and aspiration pneumonia in December 2020.  During that hospitalization patient was noted to have slurred speech and drooling, but MRI imaging showed no acute changes.  He was discharged to inpatient rehab until going home on 12/31/2019.  Wife notes he had intermittent abdominal pain since that time.  For the previous week he had been not eating much of anything and more lethargic.  Pain  located in the epigastric and left upper quadrant region.  Symptoms usually occurred after attempting to eat.  Associated symptoms included nausea, vomiting, increased urine sediment, and cough.  She had been flushing his Foley catheter twice daily with water and vinegar solution.   Patient also getting Foley catheter changed out every week.  He had been evaluated by Dr. Magnus Ivan of general surgery as he was seen during that previous hospitalization to have cholelithiasis without signs of cholecystitis.  Wife noted that they decided not to undergo surgery at that time due to his poor overall functional status.  In regards to patient's overall CODE STATUS records note that patient was previously DNR.  Husband and wife agreed that they would like to DNR status taken away and to make him a full code. Patient has been noted to aspirate, but he does not want to be on a modified diet even though this could put him at increased risks.  He is also followed in the outpatient setting by palliative care   Hospital Course by Problem:   #1.  Acute biliary pancreatitis. Received IV antibiotics.  Status post laparoscopic cholecystectomy.  Developed ileus postoperatively.  NG tube placed April 29.  NG tube clamped May 3 with minimal nausea and no vomiting.  NG removed May 4.  Provided clear liquids which he tolerated well. JP drain pulled 5/5.  Evaluated by general surgery today who cleared for discharge with 3 week follow up that they will arrange. Po intake minimal at discharge due to appetite. Instructed to advance diet slowly   #2.  History of dysphagia and aspiration pneumonia.  Initial imaging concerning for airspace opacities in the bilateral lower lungs right worse than left.  Patient declined being on modified diet in the past despite his understanding risks of aspiration.  Last dose of Unasyn May 4.  He remained afebrile  hemodynamically stable and nontoxic-appearing. Of note at time of discharge, patient working a little harder to manage own secretions   #3.  Chronic indwelling Foley catheter possible UTI versus asymptomatic bacteriuria.  Acute on chronic.  Chronic indwelling Foley catheter due to neurogenic bladder with a history of recurrent UTIs.  Home regimen includes Foley being flushed  twice a day with water and vinegar.  In addition his catheter is changed out weekly.   #4.  Hypercalcemia.  Acute.  Resolved   #5.  Elevated LFTs.  Likely related to #1.  Resolved   #6.  Multiple sclerosis with spastic gait.  Patient significantly debilitated and known to be wheelchair/bedbound at baseline. Evaluated by Palliative care for review of goals of care.  Patient with poor appetite. Home with Hospice.    #7.  Pressure ulcer of the sacrum.  Present on admission.  Stage II.   #8.  Paroxysmal atrial fibrillation.  Patient has been in sinus rhythm.  [place] Vascor 1.  Not requiring anticoagulation -Consider discontinuing telemetry   Medical Consultants:   General surgery Palliative care   Discharge Exam:   Vitals:   04/27/20 0507 04/27/20 1006  BP: 130/70 134/82  Pulse: 80 97  Resp: 18 18  Temp: 97.8 F (36.6 C) 97.9 F (36.6 C)  SpO2: 98% 95%   Vitals:   04/26/20 1004 04/26/20 2052 04/27/20 0507 04/27/20 1006  BP: 133/73 132/71 130/70 134/82  Pulse: 85 88 80 97  Resp: 18 18 18 18   Temp: (!) 97.5 F (36.4 C) 97.8 F (36.6 C) 97.8 F (36.6 C) 97.9 F (36.6 C)  TempSrc: Oral Oral Oral Oral  SpO2: 94% 95% 98% 95%  Weight:      Height:        General exam: Appears calm chronically ill slightly pale, frail Respiratory system: Clear to auscultation. Respiratory effort normal. Cardiovascular system: S1 & S2 heard, RRR. No JVD,  rubs, gallops or clicks. No murmurs. Gastrointestinal system: Abdomen is nondistended, soft and nontender. No organomegaly or masses felt. Normal bowel sounds heard. Central nervous system: Alert and oriented. Speech slow, soft. Extremities: No clubbing,  or cyanosis. No edema.frequent spasms LE  Skin: No rashes, lesions or ulcers. Psychiatry: Judgement and insight appear normal. Mood & affect appropriate.    The results of significant diagnostics from this hospitalization (including imaging, microbiology, ancillary and laboratory) are  listed below for reference.     Procedures and Diagnostic Studies:   CT ABDOMEN PELVIS W CONTRAST  Result Date: 04/17/2020 CLINICAL DATA:  Intermittent upper abdominal pain for 1 month, epigastric pain for 3 weeks, history of gallstones EXAM: CT ABDOMEN AND PELVIS WITH CONTRAST TECHNIQUE: Multidetector CT imaging of the abdomen and pelvis was performed using the standard protocol following bolus administration of intravenous contrast. CONTRAST:  <See Chart> OMNIPAQUE IOHEXOL 350 MG/ML SOLN, 04/19/2020 OMNIPAQUE IOHEXOL 300 MG/ML SOLN COMPARISON:  04/20/2013 FINDINGS: Lower chest: Scattered areas of airspace disease are seen at the lung bases, right greater than left. Findings could reflect infection or aspiration. Hepatobiliary: Gallbladder is decompressed, with nonspecific gallbladder wall thickening. Multiple calcified gallstones are identified. The liver is grossly unremarkable without focal abnormality. Pancreas: Pancreatic parenchyma enhances normally. There is mild fat stranding surrounding the pancreatic tail which could reflect acute uncomplicated pancreatitis. No fluid collection, pseudocyst, or abscess. Please correlate with laboratory analysis. Spleen: Normal in size without focal abnormality. Adrenals/Urinary Tract: There are bilateral nonobstructing renal calculi, measuring up to 5 mm in the upper pole left kidney and 7  mm lower pole right kidney. Scattered bilateral renal cortical cysts are noted. The adrenals are normal. No obstructive uropathy. The bladder is decompressed with a Foley catheter, limiting evaluation. There are 3 distinct large bladder calculi, largest measuring 3.1 cm. Stomach/Bowel: No bowel obstruction or ileus. The appendix is surgically absent. No bowel wall thickening or inflammatory change. Moderate retained stool. Vascular/Lymphatic: Aortic atherosclerosis. Approximately 50% stenosis at the origin of the right common iliac artery. Please correlate with any symptoms of right  lower extremity claudication. No pathologic adenopathy. Reproductive: Prostate is unremarkable. Other: No abdominal wall hernia or abnormality. No abdominopelvic ascites. Musculoskeletal: No acute or destructive bony lesions. Reconstructed images demonstrate no additional findings. IMPRESSION: 1. Mild fat stranding surrounding the pancreatic tail could reflect acute uncomplicated pancreatitis. Please correlate with laboratory analysis. 2. Cholelithiasis, with nonspecific gallbladder wall thickening given incomplete distension. No pericholecystic inflammatory changes to suggest cholecystitis. 3. Bilateral nonobstructing renal calculi. 4. Bladder calculi. 5. Approximately 50% stenosis at the origin of the right common iliac artery. Please correlate with any symptoms of right lower extremity claudication. 6. Scattered areas of airspace disease at the lung bases, right greater than left, could reflect infection or aspiration. 7. Aortic Atherosclerosis (ICD10-I70.0). Electronically Signed   By: Sharlet Salina M.D.   On: 04/17/2020 10:05   DG Chest Port 1 View  Result Date: 04/17/2020 CLINICAL DATA:  Cough EXAM: PORTABLE CHEST 1 VIEW COMPARISON:  12/13/2019 FINDINGS: Low volume chest with interstitial coarsening at the bases that was also seen previously but correlates with airspace disease by CT from earlier today. Normal heart size and mediastinal contours. No visible effusion or pneumothorax. Artifact from EKG leads. IMPRESSION: Low volume chest with subtle infiltrates at the bases. This correlates with atelectasis and airspace disease by abdominal CT earlier today. A similar pattern was seen December 2020, question recurrent aspiration. Electronically Signed   By: Marnee Spring M.D.   On: 04/17/2020 11:24   US Abdomen Limited RUQ  Result Date: 04/17/2020 CLINICAL DATA:  Upper abdominal pain, cholelithiasis EXAM: ULTRASOUND ABDOMEN LIMITED RIGHT UPPER QUADRANT COMPARISON:  04/17/2020 FINDINGS: Gallbladder:  Multiple shadowing gallstones are identified. Gallbladder is incompletely distended which limits evaluation. No evidence of gallbladder wall thickening or pericholecystic fluid. Common bile duct: Diameter: 4 mm Liver: No focal lesion identified. Within normal limits in parenchymal echogenicity. Portal vein is patent on color Doppler imaging with normal direction of blood flow towards the liver. Other: None. IMPRESSION: 1. Cholelithiasis without cholecystitis. Electronically Signed   By: Sharlet Salina M.D.   On: 04/17/2020 11:07     Labs:   Basic Metabolic Panel: Recent Labs  Lab 04/23/20 0519 04/23/20 0519 04/23/20 1528 04/23/20 1528 04/24/20 0623 04/24/20 0623 04/25/20 0437 04/26/20 0459  NA 156*  --  156*  --  157*  --  150* 141  K 2.7*   < > 3.7   < > 3.8   < > 3.7 4.2  CL 104  --  104  --  105  --  107 109  CO2 36*  --  36*  --  37*  --  33* 24  GLUCOSE 76  --  78  --  68*  --  91 87  BUN 18  --  16  --  18  --  17 17  CREATININE 0.92  --  0.90  --  1.01  --  0.84 0.65  CALCIUM 9.5  --  9.5  --  9.3  --  8.9 8.5*  MG 1.7  --   --   --   --   --   --   --    < > =  values in this interval not displayed.   GFR Estimated Creatinine Clearance: 98.3 mL/min (by C-G formula based on SCr of 0.65 mg/dL). Liver Function Tests: Recent Labs  Lab 04/21/20 0625 04/22/20 0444 04/23/20 0519  AST 21 22 25   ALT 30 26 25   ALKPHOS 117 105 113  BILITOT 0.9 0.6 1.1  PROT 5.5* 5.6* 5.8*  ALBUMIN 2.4* 2.5* 2.5*   Recent Labs  Lab 04/21/20 0625  LIPASE 20   No results for input(s): AMMONIA in the last 168 hours. Coagulation profile No results for input(s): INR, PROTIME in the last 168 hours.  CBC: Recent Labs  Lab 04/23/20 0519 04/24/20 0623 04/25/20 0437 04/26/20 0459 04/27/20 0600  WBC 8.7 8.6 8.6 9.3 9.6  NEUTROABS 7.0 6.6 6.3 6.8 7.4  HGB 13.0 12.9* 11.6* 9.8* 9.7*  HCT 40.7 41.2 36.6* 30.2* 30.0*  MCV 92.3 94.5 93.6 92.4 92.3  PLT 259 281 245 243 249   Cardiac  Enzymes: No results for input(s): CKTOTAL, CKMB, CKMBINDEX, TROPONINI in the last 168 hours. BNP: Invalid input(s): POCBNP CBG: No results for input(s): GLUCAP in the last 168 hours. D-Dimer No results for input(s): DDIMER in the last 72 hours. Hgb A1c No results for input(s): HGBA1C in the last 72 hours. Lipid Profile No results for input(s): CHOL, HDL, LDLCALC, TRIG, CHOLHDL, LDLDIRECT in the last 72 hours. Thyroid function studies No results for input(s): TSH, T4TOTAL, T3FREE, THYROIDAB in the last 72 hours.  Invalid input(s): FREET3 Anemia work up No results for input(s): VITAMINB12, FOLATE, FERRITIN, TIBC, IRON, RETICCTPCT in the last 72 hours. Microbiology Recent Results (from the past 240 hour(s))  Respiratory Panel by RT PCR (Flu A&B, Covid) - Nasopharyngeal Swab     Status: None   Collection Time: 04/17/20  1:56 PM   Specimen: Nasopharyngeal Swab  Result Value Ref Range Status   SARS Coronavirus 2 by RT PCR NEGATIVE NEGATIVE Final    Comment: (NOTE) SARS-CoV-2 target nucleic acids are NOT DETECTED. The SARS-CoV-2 RNA is generally detectable in upper respiratoy specimens during the acute phase of infection. The lowest concentration of SARS-CoV-2 viral copies this assay can detect is 131 copies/mL. A negative result does not preclude SARS-Cov-2 infection and should not be used as the sole basis for treatment or other patient management decisions. A negative result may occur with  improper specimen collection/handling, submission of specimen other than nasopharyngeal swab, presence of viral mutation(s) within the areas targeted by this assay, and inadequate number of viral copies (<131 copies/mL). A negative result must be combined with clinical observations, patient history, and epidemiological information. The expected result is Negative. Fact Sheet for Patients:  PinkCheek.be Fact Sheet for Healthcare Providers:    GravelBags.it This test is not yet ap proved or cleared by the [place] FDA and  has been authorized for detection and/or diagnosis of SARS-CoV-2 by FDA under an Emergency Use Authorization (EUA). This EUA will remain  in effect (meaning this test can be used) for the duration of the COVID-19 declaration under Section 564(b)(1) of the Act, 21 U.S.C. section 360bbb-3(b)(1), unless the authorization is terminated or revoked sooner.    Influenza A by PCR NEGATIVE NEGATIVE Final   Influenza B by PCR NEGATIVE NEGATIVE Final    Comment: (NOTE) The Xpert Xpress SARS-CoV-2/FLU/RSV assay is intended as an aid in  the diagnosis of influenza from Nasopharyngeal swab specimens and  should not be used as a sole basis for treatment. Nasal washings and  aspirates are unacceptable for Xpert Xpress  SARS-CoV-2/FLU/RSV  testing. Fact Sheet for Patients: https://www.moore.com/ Fact Sheet for Healthcare Providers: https://www.young.biz/ This test is not yet approved or cleared by the [place] FDA and  has been authorized for detection and/or diagnosis of SARS-CoV-2 by  FDA under an Emergency Use Authorization (EUA). This EUA will remain  in effect (meaning this test can be used) for the duration of the  Covid-19 declaration under Section 564(b)(1) of the Act, 21  U.S.C. section 360bbb-3(b)(1), unless the authorization is  terminated or revoked. Performed at [facility], [address] [place], [place] [address]   Surgical pcr screen     Status: Abnormal   Collection Time: 04/17/20 11:11 PM   Specimen: Nasal Mucosa; Nasal Swab  Result Value Ref Range Status   MRSA, PCR NEGATIVE NEGATIVE Final   Staphylococcus aureus POSITIVE (A) NEGATIVE Final    Comment: (NOTE) The Xpert SA Assay (FDA approved for NASAL specimens in patients 61 years of age and older), is one component of a comprehensive surveillance program. It  is not intended to diagnose infection nor to guide or monitor treatment. Performed at [facility], [address] [place], [place] [address]      Discharge Instructions:   Discharge Instructions     Diet - low sodium heart healthy   Complete by: As directed    Discharge instructions   Complete by: As directed    Take medications as prescribed Home with Hospice Follow up with general surgery per schedule (they will arrange)   Increase activity slowly   Complete by: As directed       Allergies as of 04/27/2020   No Known Allergies      Medication List     TAKE these medications    Aubagio 14 MG Tabs Generic drug: Teriflunomide Take by mouth.   baclofen 10 MG tablet Commonly known as: LIORESAL Take 1 tablet (10 mg total) by mouth 3 (three) times daily. What changed:  when to take this reasons to take this   calcium carbonate 500 MG chewable tablet Commonly known as: TUMS - dosed in mg elemental calcium Chew 2 tablets by mouth as needed for indigestion or heartburn.   ibuprofen 200 MG tablet Commonly known as: ADVIL Take 400 mg by mouth every 6 (six) hours as needed for moderate pain.               Durable Medical Equipment  (From admission, onward)           Start     Ordered   04/26/20 1134  For home use only DME Other see comment  Once    Comments: Michiel Sites lift  Question:  Length of Need  Answer:  6 Months   04/26/20 1134           Follow-up Information     [facility], [facility] Follow up on 05/16/2020.   Specialty: General Surgery Why: 11 am, arrive by 10:30am for paperwork and check in process Contact information: [address] [place] [address] [phone]             Time coordinating discharge: 40  minutes  Signed:  Gwenyth Bender NP  [facility] 04/27/2020, 12:11 PM

## 2020-04-27 NOTE — Progress Notes (Signed)
Patient ID: Stephen Wade, male   DOB: May 20, 1951, 69 y.o.   MRN: [id number]    9 Days Post-Op  Subjective: In bed with wife by his side and palliative team meeting with him.  Denies any abdominal pain.  No nausea, but has no appetite because nothing tastes good.  ROS: See above, otherwise other systems negative  Objective: Vital signs in last 24 hours: Temp:  [97.8 F (36.6 C)-97.9 F (36.6 C)] 97.9 F (36.6 C) (05/06 1006) Pulse Rate:  [80-97] 97 (05/06 1006) Resp:  [18] 18 (05/06 1006) BP: (130-134)/(70-82) 134/82 (05/06 1006) SpO2:  [95 %-98 %] 95 % (05/06 1006) Last BM Date: 04/25/20  Intake/Output from previous day: 05/05 0701 - 05/06 0700 In: 440 [P.O.:440] Out: 3125 [Urine:3125] Intake/Output this shift: Total I/O In: 0  Out: 600 [Urine:600]  PE: Abd: soft, NT, ND, +BS, incisions c/d/i, prior drain site is covered with old drainage present  Lab Results:  Recent Labs    04/26/20 0459 04/27/20 0600  WBC 9.3 9.6  HGB 9.8* 9.7*  HCT 30.2* 30.0*  PLT 243 249   BMET Recent Labs    04/25/20 0437 04/26/20 0459  NA 150* 141  K 3.7 4.2  CL 107 109  CO2 33* 24  GLUCOSE 91 87  BUN 17 17  CREATININE 0.84 0.65  CALCIUM 8.9 8.5*   PT/INR No results for input(s): LABPROT, INR in the last 72 hours. CMP     Component Value Date/Time   NA 141 04/26/2020 0459   NA 137 02/01/2020 0000   K 4.2 04/26/2020 0459   CL 109 04/26/2020 0459   CO2 24 04/26/2020 0459   GLUCOSE 87 04/26/2020 0459   BUN 17 04/26/2020 0459   BUN 20 02/01/2020 0000   CREATININE 0.65 04/26/2020 0459   CALCIUM 8.5 (L) 04/26/2020 0459   PROT 5.8 (L) 04/23/2020 0519   PROT 7.3 03/08/2020 1628   ALBUMIN 2.5 (L) 04/23/2020 0519   ALBUMIN 4.4 03/08/2020 1628   AST 25 04/23/2020 0519   ALT 25 04/23/2020 0519   ALKPHOS 113 04/23/2020 0519   BILITOT 1.1 04/23/2020 0519   BILITOT 0.4 03/08/2020 1628   GFRNONAA >60 04/26/2020 0459   GFRAA >60 04/26/2020 0459   Lipase     Component Value  Date/Time   LIPASE 20 04/21/2020 0625       Studies/Results: No results found.  Anti-infectives: Anti-infectives (From admission, onward)   Start     Dose/Rate Route Frequency Ordered Stop   04/20/20 0800  Ampicillin-Sulbactam (UNASYN) 3 g in sodium chloride 0.9 % 100 mL IVPB     3 g 200 mL/hr over 30 Minutes Intravenous Every 6 hours 04/19/20 1118 04/23/20 2137   04/19/20 1130  Ampicillin-Sulbactam (UNASYN) 3 g in sodium chloride 0.9 % 100 mL IVPB  Status:  Discontinued     3 g 200 mL/hr over 30 Minutes Intravenous Every 6 hours 04/19/20 1117 04/19/20 1118   04/18/20 1000  cefTRIAXone (ROCEPHIN) 2 g in sodium chloride 0.9 % 100 mL IVPB  Status:  Discontinued     2 g 200 mL/hr over 30 Minutes Intravenous Every 24 hours 04/17/20 1437 04/19/20 1117   04/18/20 1000  azithromycin (ZITHROMAX) 500 mg in sodium chloride 0.9 % 250 mL IVPB  Status:  Discontinued     500 mg 250 mL/hr over 60 Minutes Intravenous Every 24 hours 04/17/20 1437 04/19/20 1117   04/17/20 1145  cefTRIAXone (ROCEPHIN) 1 g in sodium chloride 0.9 % 100  mL IVPB     1 g 200 mL/hr over 30 Minutes Intravenous  Once 04/17/20 1141 04/17/20 1415   04/17/20 1145  azithromycin (ZITHROMAX) 500 mg in sodium chloride 0.9 % 250 mL IVPB     500 mg 250 mL/hr over 60 Minutes Intravenous  Once 04/17/20 1141 04/17/20 1902       Assessment/Plan MS Stenosis of right common iliac noted on CT UTI in setting of chronic indwelling Foley Aspiration PNA - Above per TRH -   Biliary Pancreatitis Acute on Chronic Cholecystitis  S/p Lap Chole, Dr. Ninfa Linden, 04/18/2020, POD #9 -hgb stable -on soft diet, protein shakes.  As long as he is drinking he is surgically stable for DC home.  We discussed that it may take a while after abx and medications etc to get his taste back.  I also think this is related to his overall medical situation as well -follow up has been arranged in 3 weeks. -no further surgical plans.  He is stable for DC  home.  We will sign off at this time.   FEN -regular diet VTE -SCDs, Lovenox ID -Rocephin/Azithromycin4/26-5/2. Unasyn 4/28 (for asp PNA - Per TRH) Foley-chronicindwelling  Follow-Up - [facility] clinic in 3 weeks   LOS: 10 days    Henreitta Cea , [facility] 04/27/2020, 11:24 AM Please see Amion for pager number during day hours 7:00am-4:30pm or 7:00am -11:30am on weekends

## 2020-04-27 NOTE — Progress Notes (Signed)
Nutrition Follow-up  DOCUMENTATION CODES:   Non-severe (moderate) malnutrition in context of chronic illness  INTERVENTION:  Ensure Enlive po BID, each supplement provides 350 kcal and 20 grams of protein  Boost Breeze po daily, each supplement provides 250 kcal and 9 grams of protein   NUTRITION DIAGNOSIS:   Moderate Malnutrition related to chronic illness(MS) as evidenced by mild fat depletion, mild muscle depletion.  Ongoing.  GOAL:   Patient will meet greater than or equal to 90% of their needs  Progressing.   MONITOR:   PO intake, Supplement acceptance, Weight trends, Skin, Diet advancement, Labs  REASON FOR ASSESSMENT:   NPO/Clear Liquid Diet    ASSESSMENT:   Pt with a PMH significant of MS, dysphagia, Afib, GERD, and cholelithiasis presented with acute biliary pancreatitis s/p laparoscopic cholecystectomy and postoperative ileus.  4/29 NG placed (to suction) 5/4 NG removed  Pt reports very poor appetite today. Pt would still like Ensure and is agreeable to drinking 2 per day. Pt would also like to receive one Boost Breeze daily.   Per Palliative Care, pt is stating that he is "done" and is refusing meds and nourishment. Pt is going to discharge home with hospice care.   I/O: -3,857.14ml since admit  Labs reviewed.  Medications reviewed and include: Dulcolax, Senokot-S  Diet Order:   Diet Order            Diet - low sodium heart healthy        DIET SOFT Room service appropriate? Yes with Assist; Fluid consistency: Thin  Diet effective now              EDUCATION NEEDS:   Education needs have been addressed  Skin:  Skin Assessment: Skin Integrity Issues: Skin Integrity Issues:: Incisions, Other (Comment) Incisions: abdomen Other: MASD perineum, buttocks  Last BM:  5/4 type 2  Height:   Ht Readings from Last 1 Encounters:  04/17/20 6' (1.829 m)    Weight:   Wt Readings from Last 1 Encounters:  04/17/20 88.5 kg    BMI:  Body mass  index is 26.46 kg/m.  Estimated Nutritional Needs:   Kcal:  2000-2200  Protein:  105-115 grams  Fluid:  >/= 2L/d   Eugene Gavia, MS, RD, LDN RD pager number and weekend/on-call pager number located in Amion.

## 2020-05-01 ENCOUNTER — Telehealth: Payer: Self-pay

## 2020-05-01 ENCOUNTER — Other Ambulatory Visit: Payer: Self-pay

## 2020-05-01 ENCOUNTER — Emergency Department (HOSPITAL_COMMUNITY)
Admission: EM | Admit: 2020-05-01 | Discharge: 2020-05-01 | Disposition: A | Discharge status: Home / Self Care | Payer: Medicare Other | Attending: Emergency Medicine | Admitting: Emergency Medicine

## 2020-05-01 DIAGNOSIS — R630 Anorexia: Secondary | ICD-10-CM | POA: Diagnosis not present

## 2020-05-01 DIAGNOSIS — R531 Weakness: Secondary | ICD-10-CM | POA: Diagnosis not present

## 2020-05-01 DIAGNOSIS — N390 Urinary tract infection, site not specified: Secondary | ICD-10-CM | POA: Diagnosis not present

## 2020-05-01 DIAGNOSIS — Z20822 Contact with and (suspected) exposure to covid-19: Secondary | ICD-10-CM | POA: Diagnosis not present

## 2020-05-01 DIAGNOSIS — Z79899 Other long term (current) drug therapy: Secondary | ICD-10-CM | POA: Insufficient documentation

## 2020-05-01 DIAGNOSIS — R627 Adult failure to thrive: Secondary | ICD-10-CM | POA: Insufficient documentation

## 2020-05-01 DIAGNOSIS — T83511A Infection and inflammatory reaction due to indwelling urethral catheter, initial encounter: Secondary | ICD-10-CM | POA: Diagnosis not present

## 2020-05-01 DIAGNOSIS — R432 Parageusia: Secondary | ICD-10-CM | POA: Insufficient documentation

## 2020-05-01 DIAGNOSIS — G35 Multiple sclerosis: Secondary | ICD-10-CM | POA: Insufficient documentation

## 2020-05-01 DIAGNOSIS — R9431 Abnormal electrocardiogram [ECG] [EKG]: Secondary | ICD-10-CM | POA: Diagnosis not present

## 2020-05-01 DIAGNOSIS — R4781 Slurred speech: Secondary | ICD-10-CM | POA: Insufficient documentation

## 2020-05-01 DIAGNOSIS — Z87891 Personal history of nicotine dependence: Secondary | ICD-10-CM | POA: Diagnosis not present

## 2020-05-01 DIAGNOSIS — R279 Unspecified lack of coordination: Secondary | ICD-10-CM | POA: Diagnosis not present

## 2020-05-01 DIAGNOSIS — R0902 Hypoxemia: Secondary | ICD-10-CM | POA: Diagnosis not present

## 2020-05-01 DIAGNOSIS — Z743 Need for continuous supervision: Secondary | ICD-10-CM | POA: Diagnosis not present

## 2020-05-01 DIAGNOSIS — R000 Tachycardia, unspecified: Secondary | ICD-10-CM | POA: Diagnosis not present

## 2020-05-01 DIAGNOSIS — R0689 Other abnormalities of breathing: Secondary | ICD-10-CM | POA: Diagnosis not present

## 2020-05-01 LAB — COMPREHENSIVE METABOLIC PANEL
ALT: 36 U/L (ref 0–44)
AST: 33 U/L (ref 15–41)
Albumin: 2.6 g/dL — ABNORMAL LOW (ref 3.5–5.0)
Alkaline Phosphatase: 129 U/L — ABNORMAL HIGH (ref 38–126)
Anion gap: 10 (ref 5–15)
BUN: 13 mg/dL (ref 8–23)
CO2: 23 mmol/L (ref 22–32)
Calcium: 8.8 mg/dL — ABNORMAL LOW (ref 8.9–10.3)
Chloride: 109 mmol/L (ref 98–111)
Creatinine, Ser: 0.78 mg/dL (ref 0.61–1.24)
GFR calc Af Amer: >60 mL/min (ref >60)
GFR calc non Af Amer: >60 mL/min (ref >60)
Glucose, Bld: 90 mg/dL (ref 70–99)
Potassium: 3.9 mmol/L (ref 3.5–5.1)
Sodium: 142 mmol/L (ref 135–145)
Total Bilirubin: 0.7 mg/dL (ref 0.3–1.2)
Total Protein: 5.5 g/dL — ABNORMAL LOW (ref 6.5–8.1)

## 2020-05-01 LAB — URINALYSIS, ROUTINE W REFLEX MICROSCOPIC
Bilirubin Urine: NEGATIVE
Glucose, UA: NEGATIVE mg/dL
Ketones, ur: 20 mg/dL — AB
Nitrite: NEGATIVE
Protein, ur: NEGATIVE mg/dL
Specific Gravity, Urine: 1.017 (ref 1.005–1.030)
WBC, UA: >50 WBC/hpf — ABNORMAL HIGH (ref 0–5)
pH: 5 (ref 5.0–8.0)

## 2020-05-01 LAB — CBC
HCT: 33.1 % — ABNORMAL LOW (ref 39.0–52.0)
Hemoglobin: 10.5 g/dL — ABNORMAL LOW (ref 13.0–17.0)
MCH: 29.4 pg (ref 26.0–34.0)
MCHC: 31.7 g/dL (ref 30.0–36.0)
MCV: 92.7 fL (ref 80.0–100.0)
Platelets: 434 10*3/uL — ABNORMAL HIGH (ref 150–400)
RBC: 3.57 MIL/uL — ABNORMAL LOW (ref 4.22–5.81)
RDW: 16.7 % — ABNORMAL HIGH (ref 11.5–15.5)
WBC: 9.1 10*3/uL (ref 4.0–10.5)
nRBC: 0 % (ref 0.0–0.2)

## 2020-05-01 LAB — LIPASE, BLOOD: Lipase: 48 U/L (ref 11–51)

## 2020-05-01 LAB — SARS CORONAVIRUS 2 BY RT PCR (HOSPITAL ORDER, PERFORMED IN ~~LOC~~ HOSPITAL LAB): SARS Coronavirus 2: NEGATIVE

## 2020-05-01 LAB — LACTIC ACID, PLASMA: Lactic Acid, Venous: 0.8 mmol/L (ref 0.5–1.9)

## 2020-05-01 MED ORDER — ONDANSETRON HCL 4 MG/2ML IJ SOLN
4.0000 mg | Freq: Once | INTRAMUSCULAR | Status: AC
  Administered 2020-05-01: 4 mg via INTRAVENOUS
  Filled 2020-05-01: qty 2

## 2020-05-01 MED ORDER — AMOXICILLIN-POT CLAVULANATE 500-125 MG PO TABS
500.0000 mg | ORAL_TABLET | Freq: Two times a day (BID) | ORAL | 0 refills | Status: AC
Start: 2020-05-01 — End: 2020-05-08

## 2020-05-01 MED ORDER — IBUPROFEN 800 MG PO TABS
800.0000 mg | ORAL_TABLET | Freq: Once | ORAL | Status: DC
  Filled 2020-05-01: qty 1

## 2020-05-01 MED ORDER — ONDANSETRON 4 MG PO TBDP
4.0000 mg | ORAL_TABLET | Freq: Once | ORAL | Status: AC
  Administered 2020-05-01: 4 mg via ORAL
  Filled 2020-05-01: qty 1

## 2020-05-01 MED ORDER — FENTANYL CITRATE (PF) 100 MCG/2ML IJ SOLN
50.0000 ug | Freq: Once | INTRAMUSCULAR | Status: AC
  Administered 2020-05-01: 50 ug via INTRAVENOUS
  Filled 2020-05-01: qty 2

## 2020-05-01 NOTE — Care Management (Signed)
ED CM met with patient and wife at bedside. Wife reports that patient care is growing increasingly difficult and they are now seeking hospice, orders were sent to [facility]. Wife inquiring about additional care, CM explained that this would be private duty, Wife states, that she would need more hours than she is already providing which are 2 hours per day. CM provided patient and wife with private duty resources . No further ED CM needs identified ,

## 2020-05-01 NOTE — ED Notes (Signed)
[facility] called to transport pt

## 2020-05-01 NOTE — Discharge Instructions (Signed)
You need to eat at least 2000 calories a day to maintain your weight.  You may need to contact your primary care physician for a swallow study. Please continue drinking fluids. Get help right away if: You have thoughts of ending your life. You cannot eat or drink. You do not get out of bed. Staying at home is no longer safe. You have a fever.

## 2020-05-01 NOTE — ED Provider Notes (Signed)
Discussed care with transitions team, patient has been set up for home hospice today, there is no indication for admission to the hospital, he and his spouse are aware of the resources with the hospice service.  Vital signs are reassuring, again hemoglobin is baseline, no leukocytosis, no elevation in the lactic acid, metabolic panel shows chronic low albumin and protein consistent with patient's prior labs, negative for Covid.  Please see vitals below,  Patient stable for discharge and can follow-up outpatient as needed  Vitals:   05/01/20 1300 05/01/20 1315 05/01/20 1330 05/01/20 1345  BP: 117/76 126/76 122/75 125/75  Pulse: 90 93 92 92  Resp:      Temp:      TempSrc:      SpO2: 94% 94% 94% 94%  Weight:      Height:          Eber Hong, MD 05/03/20 1448

## 2020-05-01 NOTE — ED Notes (Signed)
Pt waiting for transportation-

## 2020-05-01 NOTE — ED Provider Notes (Signed)
[facility] Provider Note   CSN: [id number] Arrival date & time: 05/01/20  1324     History Chief Complaint  Patient presents with  . Failure To [facility] is a 69 y.o. male here today for weakness and anorexia. The patient was recently hospitalized for biliary pancreatitishx gathered from patient, wife, and EMR. He underwent a Lap Chole and was discharged 4 days ago. During the course fo his admission he and his wife changed his DNR status to Full code. He has a pmh of bed bound MS, Neurogenic bowel and bladder, malnutrition, recurrent sepsis, and voluntary starvation. He states that he has been unable to eat for the past 4 days since discharge.He states that food and water taste terrible and he has no appetite]    HPI     Past Medical History:  Diagnosis Date  . GERD (gastroesophageal reflux disease)    had on- off chest pain, symptoms resolved with Prilosec 11/2010  . Lower urinary tract symptoms (LUTS)    Dr Gaynelle Arabian  . MS (multiple sclerosis) ([place]) 2005  . Testicular hypogonadism   . Urge incontinence of urine   . Urolithiasis 2011    Patient Active Problem List   Diagnosis Date Noted  . Palliative care encounter   . Voluntary starvation   . DNR (do not resuscitate)   . Malnutrition of moderate degree 04/26/2020  . Ileus ([place]) 04/26/2020  . Hypercalcemia 04/18/2020  . Pancreatitis 04/17/2020  . High risk medication use 03/10/2020  . Abnormality of gait 01/18/2020  . Noncompliance   . Depressive reaction   . Goals of care, counseling/discussion   . Encounter for hospice care discussion   . Palliative care patient   . Neurogenic bowel   . Slow transit constipation   . Acute lower UTI   . Neurogenic bladder   . Spasticity   . Depression   . Debility 12/08/2019  . Acute blood loss anemia   . Transaminitis   . Hypoalbuminemia due to protein-calorie malnutrition ([place])   . Hypernatremia   . Steroid-induced  hyperglycemia   . AKI (acute kidney injury) ([place])   . Dysphagia   . Acute respiratory failure ([place]) 12/07/2019  . Chronic indwelling Foley catheter 12/07/2019  . Acute respiratory failure with hypoxia ([place]) 11/28/2019  . Pneumonia 11/28/2019  . Atrial fibrillation with RVR ([place]) 11/28/2019  . UTI (urinary tract infection) 11/27/2019  . Acute metabolic encephalopathy 40/09/2724  . Urinary retention 06/22/2019  . PCP NOTES >>>>>>> 05/24/2018  . Depression, recurrent ([place]) 05/24/2018  . History of recurrent UTIs 05/22/2018  . Pressure injury of skin 11/06/2017  . Multiple sclerosis ([place]) 11/04/2017  . Skin ulcer of sacral region ([place]) 05/26/2017  . Vitamin D deficiency 01/16/2016  . Spastic gait 02/10/2015  . Spastic diplegia ([place]) 02/10/2015  . Other fatigue 02/10/2015  . Disturbed cognition 02/10/2015  . Edema 05/12/2014  . Hypogonadism male 03/11/2013  . BPH - self caths 12/26/2011  . GERD 11/30/2010  . URINARY CALCULUS 10/05/2010    Past Surgical History:  Procedure Laterality Date  . APPENDECTOMY      ruptured 03-2010  . CHOLECYSTECTOMY N/A 04/18/2020   Procedure: LAPAROSCOPIC CHOLECYSTECTOMY WITH attempted INTRAOPERATIVE CHOLANGIOGRAM;  Surgeon: Coralie Keens, MD;  Location: [facility];  Service: General;  Laterality: N/A;  . VASECTOMY         Family History  Problem Relation Age of Onset  . Heart attack Mother   . Brain  cancer Father   . Colon cancer Neg Hx   . Prostate cancer Neg Hx     Social History   Tobacco Use  . Smoking status: Former Games developer  . Smokeless tobacco: Never Used  . Tobacco comment: 3 ppd, quit 1999  Substance Use Topics  . Alcohol use: No    Alcohol/week: 0.0 standard drinks    Comment: rare  . Drug use: No    Home Medications Prior to Admission medications   Medication Sig Start Date End Date Taking? Authorizing Provider  baclofen (LIORESAL) 10 MG tablet Take 1 tablet (10 mg total) by mouth 3 (three) times daily. Patient taking  differently: Take 10 mg by mouth 3 (three) times daily as needed for muscle spasms.  12/30/19 12/29/20  Angiulli, Mcarthur Rossetti, PA-C  calcium carbonate (TUMS - DOSED IN MG ELEMENTAL CALCIUM) 500 MG chewable tablet Chew 2 tablets by mouth as needed for indigestion or heartburn.    [provider]  ibuprofen (ADVIL) 200 MG tablet Take 400 mg by mouth every 6 (six) hours as needed for moderate pain.    [provider]  Teriflunomide (AUBAGIO) 14 MG TABS Take by mouth.    [provider]    Allergies    Patient has no known allergies.  Review of Systems   Review of Systems Ten systems reviewed and are negative for acute change, except as noted in the HPI.   Physical Exam Updated Vital Signs SpO2 99%   Physical Exam Vitals and nursing note reviewed.  Constitutional:      General: He is not in acute distress.    Appearance: He is well-developed. He is not diaphoretic.  HENT:     Head: Normocephalic and atraumatic.  Eyes:     General: No scleral icterus.    Conjunctiva/sclera: Conjunctivae normal.  Neck:     Comments: Normal phonation.  Swallowing fluids and saliva without difficulty Cardiovascular:     Rate and Rhythm: Normal rate and regular rhythm.     Heart sounds: Normal heart sounds.  Pulmonary:     Effort: Pulmonary effort is normal. No respiratory distress.     Breath sounds: Normal breath sounds.  Abdominal:     Palpations: Abdomen is soft.     Tenderness: There is no abdominal tenderness.     Comments: Abdomen soft and nontender  Well-healing trocar sites on the abdomen without tenderness or discharge.  Musculoskeletal:     Cervical back: Normal range of motion and neck supple.  Skin:    General: Skin is warm and dry.  Neurological:     Mental Status: He is alert.     Comments: Slurred speech, weakness of the right arm  Psychiatric:        Behavior: Behavior normal.     ED Results / Procedures / Treatments   Labs (all labs ordered are  listed, but only abnormal results are displayed) Labs Reviewed - No data to display  EKG None  Radiology No results found.  Procedures Procedures (including critical care time)  Medications Ordered in ED Medications - No data to display  ED Course  I have reviewed the triage vital signs and the nursing notes.  Pertinent labs & imaging results that were available during my care of the patient were reviewed by me and considered in my medical decision making (see chart for details).    MDM Rules/Calculators/A&P  Patient here stating that everything tastes terrible and he cannot eat and has no appetite.  He also states that sometimes he cannot swallow however he does state that he is able to take Ensure and fluids at home.  Patient states that everything tastes "disgusting."  I have ordered interpreted and reviewed the patient's labs which show normal lactic acid, CMP with low calcium and total protein levels likely secondary to his anorexia.  Covid test negative.  Lipase within normal limits, CBC with baseline normocytic anemia likely secondary to his chronic disease. He has a benign abdominal exam and I doubt any intra-abdominal infection status post laparoscopic cholecystectomy.  Question if he is having some esophageal issues given his history of MS and I also question if potentially his dysgeusia secondary to his MS.  Do not think he has any medical reason to be readmitted at this time.  Currently the transition of care team is working to help with the patient and his wife with decision making about skilled nursing facility versus palliative care and help home health with PT OT versus hospice care.  I discussed the fact that the patient can leave hospice care at any time he does not have to have a 68-month perspective life term.  He and his wife are discussing options.  Plan to discharge home after transition of care team is done with their assistance.  Have given signout  to Dr. Gloris Manchester is aware of plan for discharge. Final Clinical Impression(s) / ED Diagnoses Final diagnoses:  None    Rx / DC Orders ED Discharge Orders    None       Arthor Captain, PA-C 05/01/20 1608    Pricilla Loveless, MD 05/02/20 [phone]

## 2020-05-01 NOTE — ED Notes (Signed)
[facility] called to transport pt 

## 2020-05-01 NOTE — Telephone Encounter (Signed)
Hospice orders signed and mailed back in envelope provided. Copy of forms sent for scanning.

## 2020-05-01 NOTE — ED Notes (Signed)
Discharge instructions discussed with pt and wife. Pt verbalized understanding. Pt stable and ambulatory.

## 2020-05-01 NOTE — ED Provider Notes (Signed)
Care of the patient was assumed from Arthor Captain PA-C at 1600; see this physician's note for complete history of present illness, review of systems, and physical exam.  Briefly, the patient is a 69 y.o. male who presented to the ED with FTT.  History significant for MS, recent lap chole.  Plan at time of handoff:  -Labs unremarkable. Medically cleared. Case manager working on disposition.  MDM/ED Course: On my initial exam, the patient was resting in bed, with wife at bedside.  Patient has an indwelling foley that is changed every 6 days. UA was ordered at the request of case manager. Following UA, he will be ready for discharge, per CM guidance. UA results suggestive of acute infection. Patient has had VRE and MSSA urine infections in the past. Current cultures are pending. Pharmacy recommended Augmentin, based on previous cultures. Augmentin was prescribed. Upon reassessment, he continued to rest in bed. He endorsed nausea. ODT zofran was ordered. Patient was discharged in stable condition.  Patient seen in conjunction with the attending physician, Dr. Hyacinth Meeker, MD, who participated in all aspects of the patient's care and was in agreement with the above plan.   Emergency Department Medication Summary: Medications  ibuprofen (ADVIL) tablet 800 mg (800 mg Oral Refused 05/01/20 1147)  fentaNYL (SUBLIMAZE) injection 50 mcg (50 mcg Intravenous Given 05/01/20 1215)  ondansetron (ZOFRAN) injection 4 mg (4 mg Intravenous Given 05/01/20 1215)   Clinical Impression: 1. Anorexia   2. Dysgeusia      Gloris Manchester, MD 05/02/20 1324    Eber Hong, MD 05/03/20 [phone]

## 2020-05-01 NOTE — ED Triage Notes (Signed)
Pt arrives via EMS with complaints of dehydration and failure to thrive. Per EMS report pt has not been eating or drinking since gallbladder surgery. Pt stated he doesn't want to eat because food does not taste the same. Pt alert and oriented X4 and is bedbound at base line.

## 2020-05-01 NOTE — ED Notes (Signed)
Wife updated that pt is on the way home

## 2020-05-02 ENCOUNTER — Telehealth: Payer: Self-pay | Admitting: *Deleted

## 2020-05-02 NOTE — Telephone Encounter (Signed)
Pt wife called regarding not having Rx.  Pt was brought home by [facility] and she did not receive his paperwork.  RNCM called in Rx to pharmacy of choice.

## 2020-05-04 DIAGNOSIS — N319 Neuromuscular dysfunction of bladder, unspecified: Secondary | ICD-10-CM | POA: Diagnosis not present

## 2020-05-04 DIAGNOSIS — G35 Multiple sclerosis: Secondary | ICD-10-CM | POA: Diagnosis not present

## 2020-05-04 DIAGNOSIS — I480 Paroxysmal atrial fibrillation: Secondary | ICD-10-CM | POA: Diagnosis not present

## 2020-05-04 DIAGNOSIS — Z87440 Personal history of urinary (tract) infections: Secondary | ICD-10-CM | POA: Diagnosis not present

## 2020-05-04 DIAGNOSIS — R1310 Dysphagia, unspecified: Secondary | ICD-10-CM | POA: Diagnosis not present

## 2020-05-04 DIAGNOSIS — Z436 Encounter for attention to other artificial openings of urinary tract: Secondary | ICD-10-CM | POA: Diagnosis not present

## 2020-05-04 DIAGNOSIS — D649 Anemia, unspecified: Secondary | ICD-10-CM | POA: Diagnosis not present

## 2020-05-04 DIAGNOSIS — L89152 Pressure ulcer of sacral region, stage 2: Secondary | ICD-10-CM | POA: Diagnosis not present

## 2020-05-04 DIAGNOSIS — E440 Moderate protein-calorie malnutrition: Secondary | ICD-10-CM | POA: Diagnosis not present

## 2020-05-04 DIAGNOSIS — K219 Gastro-esophageal reflux disease without esophagitis: Secondary | ICD-10-CM | POA: Diagnosis not present

## 2020-05-04 DIAGNOSIS — Z8701 Personal history of pneumonia (recurrent): Secondary | ICD-10-CM | POA: Diagnosis not present

## 2020-05-04 DIAGNOSIS — Z8619 Personal history of other infectious and parasitic diseases: Secondary | ICD-10-CM | POA: Diagnosis not present

## 2020-05-05 DIAGNOSIS — E440 Moderate protein-calorie malnutrition: Secondary | ICD-10-CM | POA: Diagnosis not present

## 2020-05-05 DIAGNOSIS — I480 Paroxysmal atrial fibrillation: Secondary | ICD-10-CM | POA: Diagnosis not present

## 2020-05-05 DIAGNOSIS — R1310 Dysphagia, unspecified: Secondary | ICD-10-CM | POA: Diagnosis not present

## 2020-05-05 DIAGNOSIS — L89152 Pressure ulcer of sacral region, stage 2: Secondary | ICD-10-CM | POA: Diagnosis not present

## 2020-05-05 DIAGNOSIS — N319 Neuromuscular dysfunction of bladder, unspecified: Secondary | ICD-10-CM | POA: Diagnosis not present

## 2020-05-05 DIAGNOSIS — G35 Multiple sclerosis: Secondary | ICD-10-CM | POA: Diagnosis not present

## 2020-05-05 LAB — URINE CULTURE: Culture: >=100000 — AB

## 2020-05-06 DIAGNOSIS — N319 Neuromuscular dysfunction of bladder, unspecified: Secondary | ICD-10-CM | POA: Diagnosis not present

## 2020-05-06 DIAGNOSIS — L89152 Pressure ulcer of sacral region, stage 2: Secondary | ICD-10-CM | POA: Diagnosis not present

## 2020-05-06 DIAGNOSIS — R1310 Dysphagia, unspecified: Secondary | ICD-10-CM | POA: Diagnosis not present

## 2020-05-06 DIAGNOSIS — I480 Paroxysmal atrial fibrillation: Secondary | ICD-10-CM | POA: Diagnosis not present

## 2020-05-06 DIAGNOSIS — E440 Moderate protein-calorie malnutrition: Secondary | ICD-10-CM | POA: Diagnosis not present

## 2020-05-06 DIAGNOSIS — G35 Multiple sclerosis: Secondary | ICD-10-CM | POA: Diagnosis not present

## 2020-05-07 ENCOUNTER — Telehealth: Payer: Self-pay | Admitting: Emergency Medicine

## 2020-05-07 NOTE — Telephone Encounter (Signed)
Post ED Visit - Positive Culture Follow-up  Culture report reviewed by antimicrobial stewardship pharmacist: [facility] []  , Pharm.D. []  Enzo Bi, Pharm.D., BCPS AQ-ID []  , Pharm.D., BCPS []  Celedonio Miyamoto, .D., BCPS []  [place], .D., BCPS, AAHIVP []  Georgina Pillion, Pharm.D., BCPS, AAHIVP []  [address], PharmD, BCPS []  , PharmD, BCPS []  [place], PharmD, BCPS []  [address], PharmD []  , PharmD, BCPS [x]  Estella Husk, PharmD  Pharmacy Team []  Lysle Pearl, PharmD []  , PharmD []  Phillips Climes, PharmD []  , Rph []  [employer]) , PharmD []  Verlan Friends, PharmD []  , PharmD []  Mervyn Gay, PharmD []  , PharmD []  Vinnie Level, PharmD []  [facility], PharmD []  , PharmD []  Len Childs, PharmD   Positive urine culture Treated with Augmentin, organism sensitive to the same and no further patient follow-up is required at this time.  Araya Roel 05/07/2020, 5:50 PM

## 2020-05-08 DIAGNOSIS — I480 Paroxysmal atrial fibrillation: Secondary | ICD-10-CM | POA: Diagnosis not present

## 2020-05-08 DIAGNOSIS — G35 Multiple sclerosis: Secondary | ICD-10-CM | POA: Diagnosis not present

## 2020-05-08 DIAGNOSIS — N319 Neuromuscular dysfunction of bladder, unspecified: Secondary | ICD-10-CM | POA: Diagnosis not present

## 2020-05-08 DIAGNOSIS — R1310 Dysphagia, unspecified: Secondary | ICD-10-CM | POA: Diagnosis not present

## 2020-05-08 DIAGNOSIS — E440 Moderate protein-calorie malnutrition: Secondary | ICD-10-CM | POA: Diagnosis not present

## 2020-05-08 DIAGNOSIS — L89152 Pressure ulcer of sacral region, stage 2: Secondary | ICD-10-CM | POA: Diagnosis not present

## 2020-05-09 DIAGNOSIS — R1310 Dysphagia, unspecified: Secondary | ICD-10-CM | POA: Diagnosis not present

## 2020-05-09 DIAGNOSIS — N319 Neuromuscular dysfunction of bladder, unspecified: Secondary | ICD-10-CM | POA: Diagnosis not present

## 2020-05-09 DIAGNOSIS — L89152 Pressure ulcer of sacral region, stage 2: Secondary | ICD-10-CM | POA: Diagnosis not present

## 2020-05-09 DIAGNOSIS — E440 Moderate protein-calorie malnutrition: Secondary | ICD-10-CM | POA: Diagnosis not present

## 2020-05-09 DIAGNOSIS — I480 Paroxysmal atrial fibrillation: Secondary | ICD-10-CM | POA: Diagnosis not present

## 2020-05-09 DIAGNOSIS — G35 Multiple sclerosis: Secondary | ICD-10-CM | POA: Diagnosis not present

## 2020-05-11 DIAGNOSIS — G35 Multiple sclerosis: Secondary | ICD-10-CM | POA: Diagnosis not present

## 2020-05-11 DIAGNOSIS — L89152 Pressure ulcer of sacral region, stage 2: Secondary | ICD-10-CM | POA: Diagnosis not present

## 2020-05-11 DIAGNOSIS — N319 Neuromuscular dysfunction of bladder, unspecified: Secondary | ICD-10-CM | POA: Diagnosis not present

## 2020-05-11 DIAGNOSIS — I480 Paroxysmal atrial fibrillation: Secondary | ICD-10-CM | POA: Diagnosis not present

## 2020-05-11 DIAGNOSIS — R1310 Dysphagia, unspecified: Secondary | ICD-10-CM | POA: Diagnosis not present

## 2020-05-11 DIAGNOSIS — E440 Moderate protein-calorie malnutrition: Secondary | ICD-10-CM | POA: Diagnosis not present

## 2020-05-12 DIAGNOSIS — R1310 Dysphagia, unspecified: Secondary | ICD-10-CM | POA: Diagnosis not present

## 2020-05-12 DIAGNOSIS — E440 Moderate protein-calorie malnutrition: Secondary | ICD-10-CM | POA: Diagnosis not present

## 2020-05-12 DIAGNOSIS — L89152 Pressure ulcer of sacral region, stage 2: Secondary | ICD-10-CM | POA: Diagnosis not present

## 2020-05-12 DIAGNOSIS — N319 Neuromuscular dysfunction of bladder, unspecified: Secondary | ICD-10-CM | POA: Diagnosis not present

## 2020-05-12 DIAGNOSIS — I480 Paroxysmal atrial fibrillation: Secondary | ICD-10-CM | POA: Diagnosis not present

## 2020-05-12 DIAGNOSIS — G35 Multiple sclerosis: Secondary | ICD-10-CM | POA: Diagnosis not present

## 2020-05-15 DIAGNOSIS — L89152 Pressure ulcer of sacral region, stage 2: Secondary | ICD-10-CM | POA: Diagnosis not present

## 2020-05-15 DIAGNOSIS — N319 Neuromuscular dysfunction of bladder, unspecified: Secondary | ICD-10-CM | POA: Diagnosis not present

## 2020-05-15 DIAGNOSIS — E440 Moderate protein-calorie malnutrition: Secondary | ICD-10-CM | POA: Diagnosis not present

## 2020-05-15 DIAGNOSIS — G35 Multiple sclerosis: Secondary | ICD-10-CM | POA: Diagnosis not present

## 2020-05-15 DIAGNOSIS — I480 Paroxysmal atrial fibrillation: Secondary | ICD-10-CM | POA: Diagnosis not present

## 2020-05-15 DIAGNOSIS — R1310 Dysphagia, unspecified: Secondary | ICD-10-CM | POA: Diagnosis not present

## 2020-05-16 DIAGNOSIS — E440 Moderate protein-calorie malnutrition: Secondary | ICD-10-CM | POA: Diagnosis not present

## 2020-05-16 DIAGNOSIS — R1310 Dysphagia, unspecified: Secondary | ICD-10-CM | POA: Diagnosis not present

## 2020-05-16 DIAGNOSIS — G35 Multiple sclerosis: Secondary | ICD-10-CM | POA: Diagnosis not present

## 2020-05-16 DIAGNOSIS — N319 Neuromuscular dysfunction of bladder, unspecified: Secondary | ICD-10-CM | POA: Diagnosis not present

## 2020-05-16 DIAGNOSIS — L89152 Pressure ulcer of sacral region, stage 2: Secondary | ICD-10-CM | POA: Diagnosis not present

## 2020-05-16 DIAGNOSIS — I480 Paroxysmal atrial fibrillation: Secondary | ICD-10-CM | POA: Diagnosis not present

## 2020-05-19 DIAGNOSIS — L89152 Pressure ulcer of sacral region, stage 2: Secondary | ICD-10-CM | POA: Diagnosis not present

## 2020-05-19 DIAGNOSIS — E440 Moderate protein-calorie malnutrition: Secondary | ICD-10-CM | POA: Diagnosis not present

## 2020-05-19 DIAGNOSIS — N319 Neuromuscular dysfunction of bladder, unspecified: Secondary | ICD-10-CM | POA: Diagnosis not present

## 2020-05-19 DIAGNOSIS — I480 Paroxysmal atrial fibrillation: Secondary | ICD-10-CM | POA: Diagnosis not present

## 2020-05-19 DIAGNOSIS — G35 Multiple sclerosis: Secondary | ICD-10-CM | POA: Diagnosis not present

## 2020-05-19 DIAGNOSIS — R1310 Dysphagia, unspecified: Secondary | ICD-10-CM | POA: Diagnosis not present

## 2020-05-22 DIAGNOSIS — R1310 Dysphagia, unspecified: Secondary | ICD-10-CM | POA: Diagnosis not present

## 2020-05-22 DIAGNOSIS — E440 Moderate protein-calorie malnutrition: Secondary | ICD-10-CM | POA: Diagnosis not present

## 2020-05-22 DIAGNOSIS — L89152 Pressure ulcer of sacral region, stage 2: Secondary | ICD-10-CM | POA: Diagnosis not present

## 2020-05-22 DIAGNOSIS — G35 Multiple sclerosis: Secondary | ICD-10-CM | POA: Diagnosis not present

## 2020-05-22 DIAGNOSIS — I480 Paroxysmal atrial fibrillation: Secondary | ICD-10-CM | POA: Diagnosis not present

## 2020-05-22 DIAGNOSIS — N319 Neuromuscular dysfunction of bladder, unspecified: Secondary | ICD-10-CM | POA: Diagnosis not present

## 2020-05-26 IMAGING — CT CT ANGIO CHEST
2 of 6 series · 18 of 46 positions shown · IV contrast (OMNIPAQUE)
Comparison: CT chest 04/08/2010 and CT abdomen 03/17/2013

CLINICAL DATA: Positive D-dimer.  Suspect pulmonary embolism.

EXAM:
CT ANGIOGRAPHY CHEST WITH CONTRAST
TECHNIQUE: Multidetector CT imaging of the chest was performed using the
standard protocol during bolus administration of intravenous
contrast. Multiplanar CT image reconstructions and MIPs were
obtained to evaluate the vascular anatomy.
CONTRAST:  100mL OMNIPAQUE IOHEXOL 350 MG/ML SOLN

[Series 5: thins · axial · 0.73mm/px · z∈[-257,-2]mm · 16 of 281 slices shown]
[im 13/281  lung]
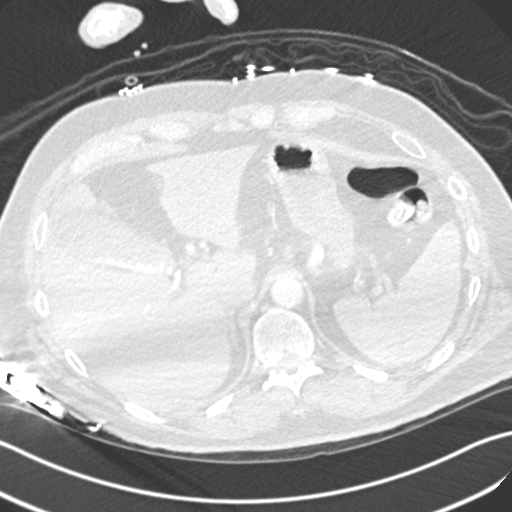
[im 37/281  soft-tissue]
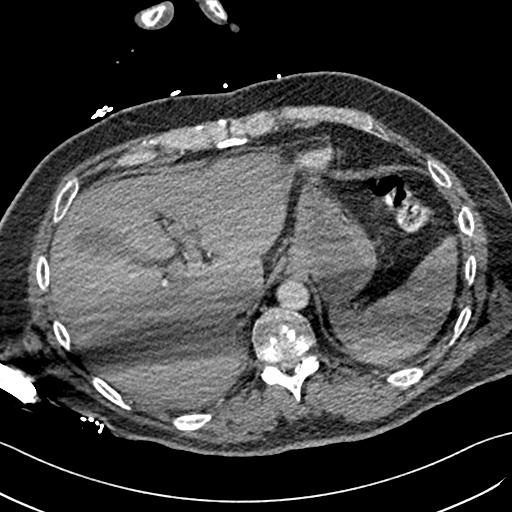
[im 49/281  lung]
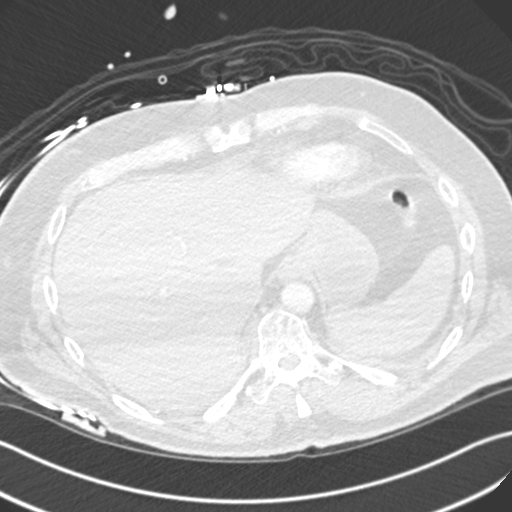
[im 61/281  soft-tissue]
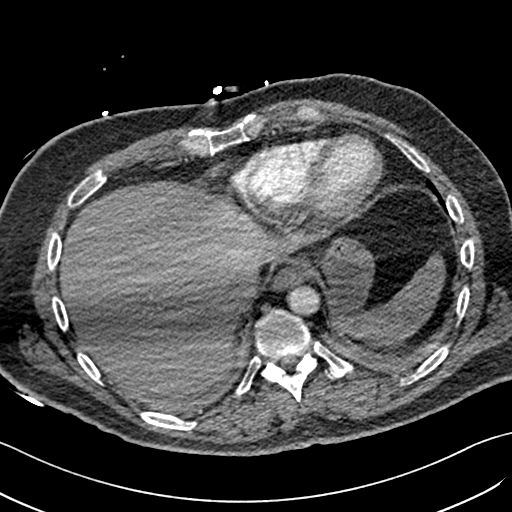
[im 86/281  lung]
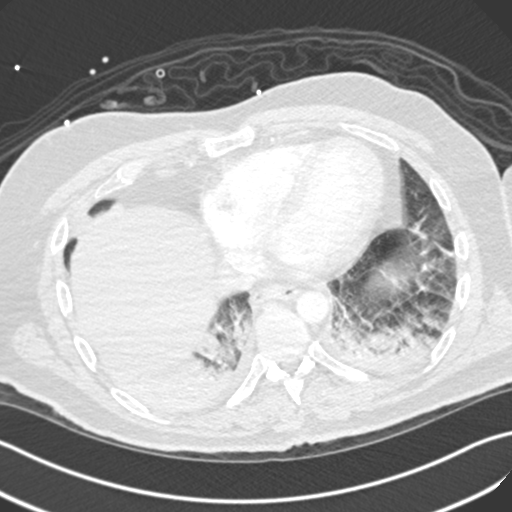
[im 98/281  soft-tissue]
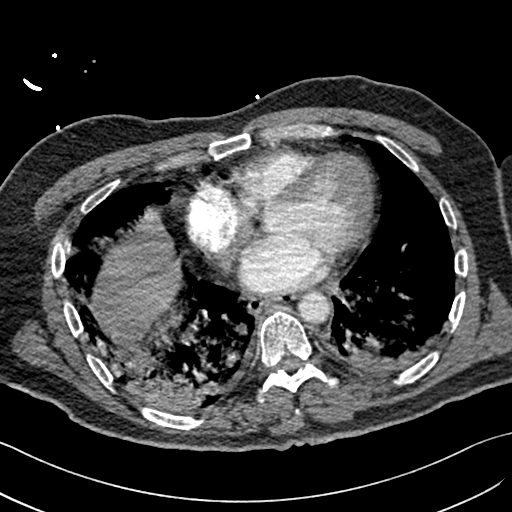
[im 110/281  lung]
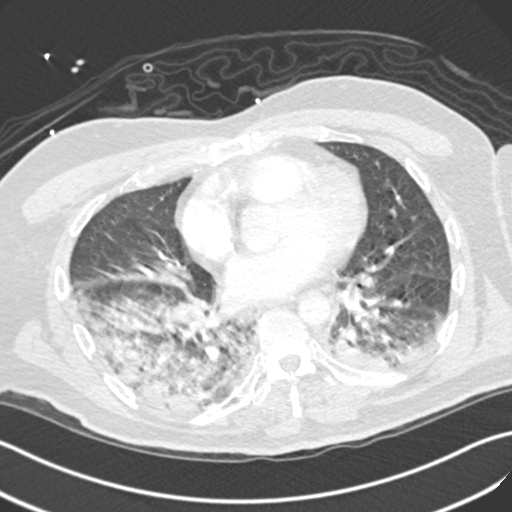
[im 134/281  soft-tissue]
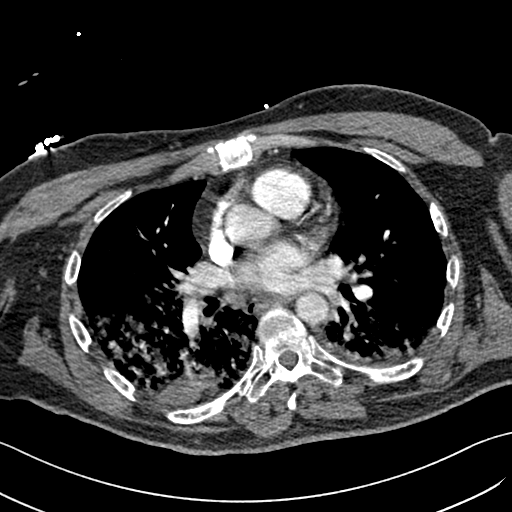
[im 147/281  lung]
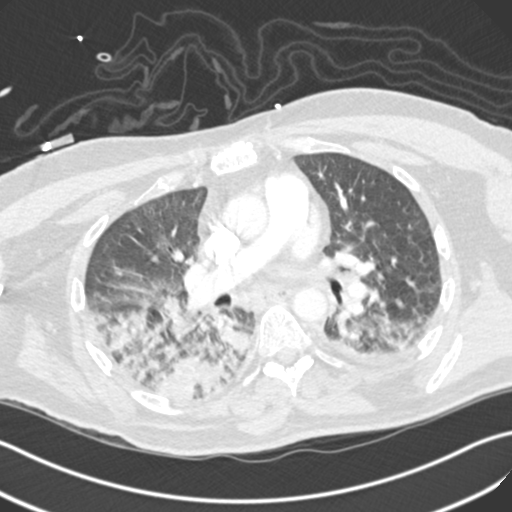
[im 171/281  soft-tissue]
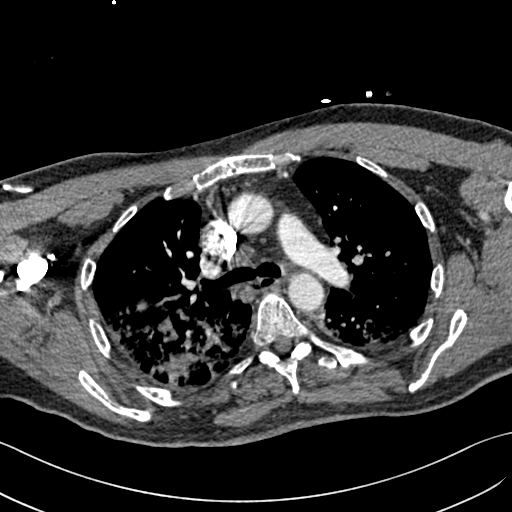
[im 183/281  lung]
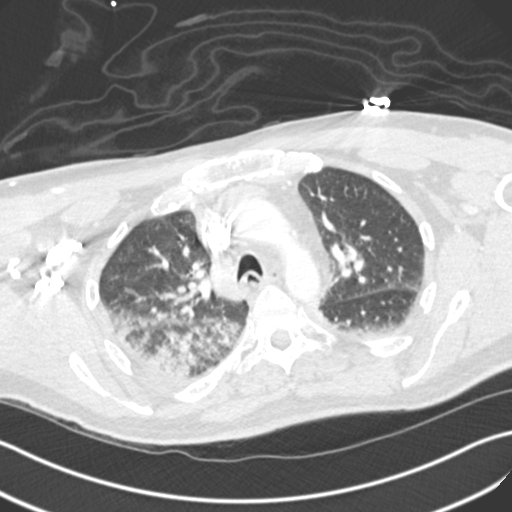
[im 195/281  soft-tissue]
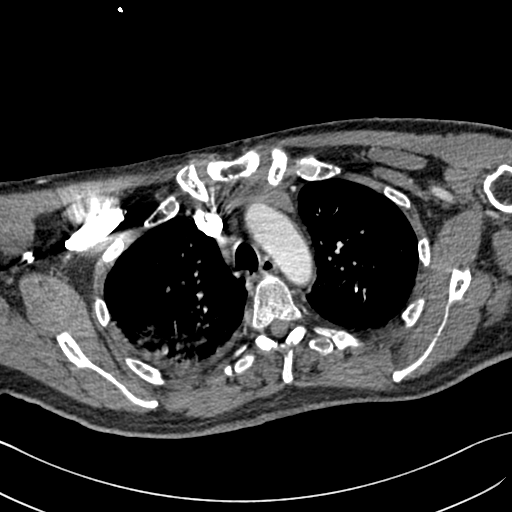
[im 220/281  lung]
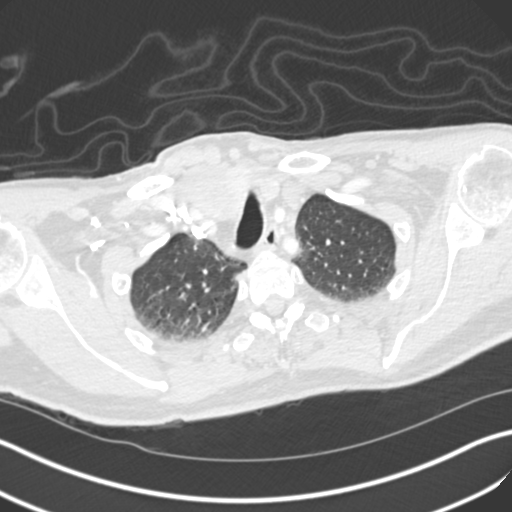
[im 232/281  soft-tissue]
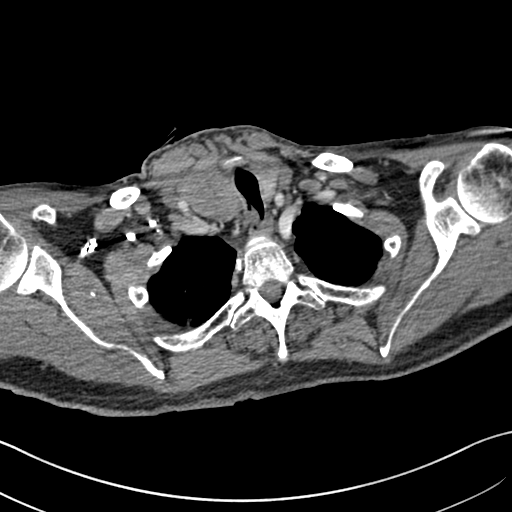
[im 244/281  lung]
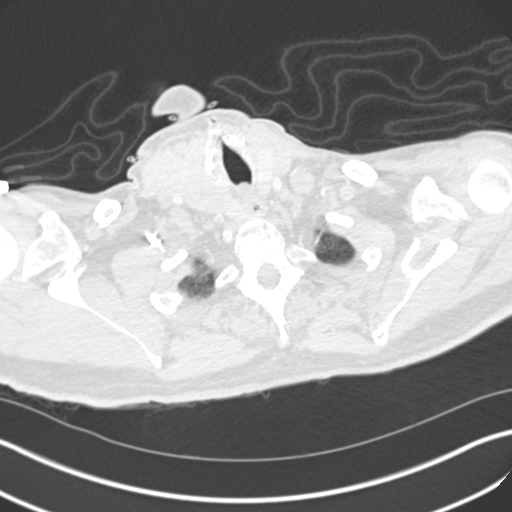
[im 268/281  soft-tissue]
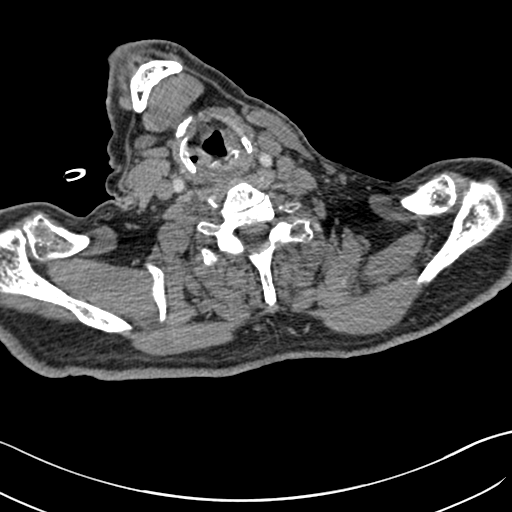

[Series 7: coronal mpr · coronal · 0.58mm/px · 2 of 88 slices shown]
[im 30/88  soft-tissue]
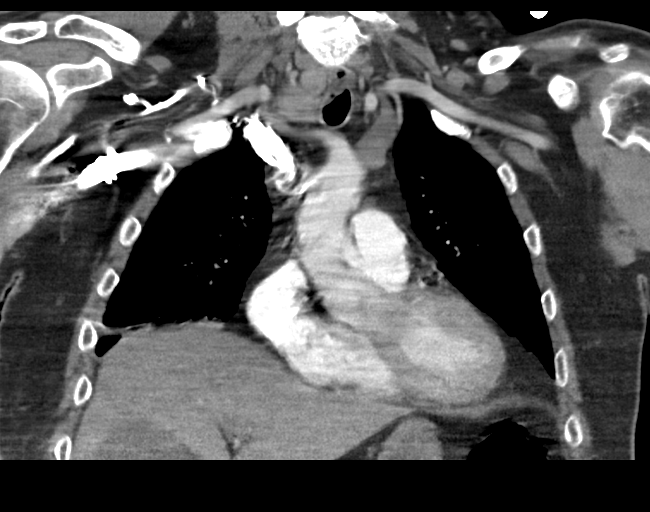
[im 59/88  soft-tissue]
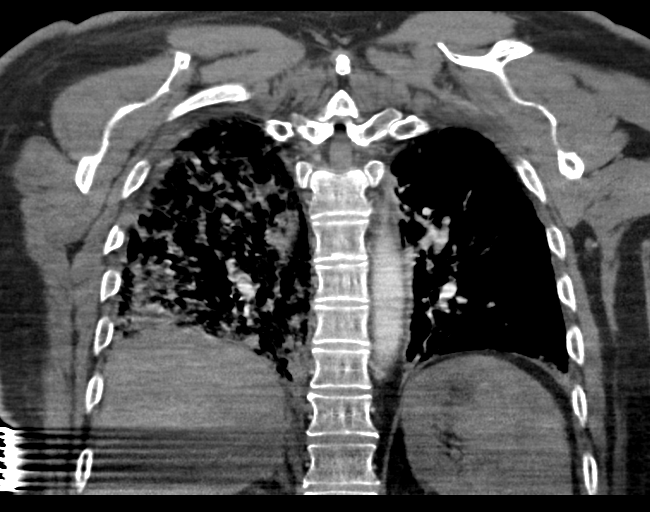

[18 of 46 positions shown; findings below may reference images not displayed]

FINDINGS: Cardiovascular: Mild stable cardiomegaly. Minimal calcified plaque
over the 3 vessel coronary arteries. Thoracic aorta is normal in
caliber without evidence of aneurysm or dissection. Pulmonary
arterial system is well opacified without evidence of emboli.
Remaining vascular structures are unremarkable.

Mediastinum/Nodes: Right infrahilar 1 cm lymph node. No other
significant mediastinal or hilar adenopathy. Remaining mediastinal
structures are unremarkable. There is prominence of mild
heterogeneity of the thyroid gland right worse than left likely
goiter. This causes mild deviation of the trachea to the left.

Lungs/Pleura: Lungs are adequately inflated demonstrate moderate
airspace process over the right lower lobe and minimally involving
the posterior right upper lobe as well as the right middle lobe.
There is also mild airspace opacification over the left lower lobe.
Findings likely due to multifocal pneumonia. No evidence of
effusion. Airways are unremarkable.

Upper Abdomen: Cholelithiasis. Minimal calcified plaque over the
abdominal aorta.

Musculoskeletal: No acute findings.

Review of the MIP images confirms the above findings.
IMPRESSION: 1.  No evidence of pulmonary embolism.

2. Moderate bilateral airspace process most prominent over the right
lower lobe likely multifocal pneumonia. 1 cm right infrahilar lymph
node likely reactive.

3. Aortic Atherosclerosis ([id number]). Atherosclerotic coronary
artery disease.

4. Enlarged heterogeneous thyroid gland right worse than left with
tracheal deviation slightly to the left. This has been evaluated by
previous ultrasound imaging with fine needle aspiration biopsy [id number].

5.  Cholelithiasis.

## 2020-06-22 DIAGNOSIS — G35 Multiple sclerosis: Secondary | ICD-10-CM | POA: Diagnosis not present

## 2020-06-22 DIAGNOSIS — L89152 Pressure ulcer of sacral region, stage 2: Secondary | ICD-10-CM | POA: Diagnosis not present

## 2020-06-22 DIAGNOSIS — Z8701 Personal history of pneumonia (recurrent): Secondary | ICD-10-CM | POA: Diagnosis not present

## 2020-06-22 DIAGNOSIS — K219 Gastro-esophageal reflux disease without esophagitis: Secondary | ICD-10-CM | POA: Diagnosis not present

## 2020-06-22 DIAGNOSIS — Z436 Encounter for attention to other artificial openings of urinary tract: Secondary | ICD-10-CM | POA: Diagnosis not present

## 2020-06-22 DIAGNOSIS — Z8619 Personal history of other infectious and parasitic diseases: Secondary | ICD-10-CM | POA: Diagnosis not present

## 2020-06-22 DIAGNOSIS — Z87440 Personal history of urinary (tract) infections: Secondary | ICD-10-CM | POA: Diagnosis not present

## 2020-06-22 DIAGNOSIS — D649 Anemia, unspecified: Secondary | ICD-10-CM | POA: Diagnosis not present

## 2020-06-22 DIAGNOSIS — I480 Paroxysmal atrial fibrillation: Secondary | ICD-10-CM | POA: Diagnosis not present

## 2020-06-22 DIAGNOSIS — R1310 Dysphagia, unspecified: Secondary | ICD-10-CM | POA: Diagnosis not present

## 2020-06-22 DIAGNOSIS — N319 Neuromuscular dysfunction of bladder, unspecified: Secondary | ICD-10-CM | POA: Diagnosis not present

## 2020-06-22 DIAGNOSIS — E440 Moderate protein-calorie malnutrition: Secondary | ICD-10-CM | POA: Diagnosis not present

## 2020-06-23 DIAGNOSIS — G35 Multiple sclerosis: Secondary | ICD-10-CM | POA: Diagnosis not present

## 2020-06-23 DIAGNOSIS — L89152 Pressure ulcer of sacral region, stage 2: Secondary | ICD-10-CM | POA: Diagnosis not present

## 2020-06-23 DIAGNOSIS — N319 Neuromuscular dysfunction of bladder, unspecified: Secondary | ICD-10-CM | POA: Diagnosis not present

## 2020-06-23 DIAGNOSIS — E440 Moderate protein-calorie malnutrition: Secondary | ICD-10-CM | POA: Diagnosis not present

## 2020-06-23 DIAGNOSIS — R1310 Dysphagia, unspecified: Secondary | ICD-10-CM | POA: Diagnosis not present

## 2020-06-23 DIAGNOSIS — I480 Paroxysmal atrial fibrillation: Secondary | ICD-10-CM | POA: Diagnosis not present

## 2020-06-26 DIAGNOSIS — G35 Multiple sclerosis: Secondary | ICD-10-CM | POA: Diagnosis not present

## 2020-06-26 DIAGNOSIS — R1310 Dysphagia, unspecified: Secondary | ICD-10-CM | POA: Diagnosis not present

## 2020-06-26 DIAGNOSIS — I480 Paroxysmal atrial fibrillation: Secondary | ICD-10-CM | POA: Diagnosis not present

## 2020-06-26 DIAGNOSIS — E440 Moderate protein-calorie malnutrition: Secondary | ICD-10-CM | POA: Diagnosis not present

## 2020-06-26 DIAGNOSIS — N319 Neuromuscular dysfunction of bladder, unspecified: Secondary | ICD-10-CM | POA: Diagnosis not present

## 2020-06-26 DIAGNOSIS — L89152 Pressure ulcer of sacral region, stage 2: Secondary | ICD-10-CM | POA: Diagnosis not present

## 2020-06-29 DIAGNOSIS — I480 Paroxysmal atrial fibrillation: Secondary | ICD-10-CM | POA: Diagnosis not present

## 2020-06-29 DIAGNOSIS — G35 Multiple sclerosis: Secondary | ICD-10-CM | POA: Diagnosis not present

## 2020-06-29 DIAGNOSIS — R1310 Dysphagia, unspecified: Secondary | ICD-10-CM | POA: Diagnosis not present

## 2020-06-29 DIAGNOSIS — N319 Neuromuscular dysfunction of bladder, unspecified: Secondary | ICD-10-CM | POA: Diagnosis not present

## 2020-06-29 DIAGNOSIS — E440 Moderate protein-calorie malnutrition: Secondary | ICD-10-CM | POA: Diagnosis not present

## 2020-06-29 DIAGNOSIS — L89152 Pressure ulcer of sacral region, stage 2: Secondary | ICD-10-CM | POA: Diagnosis not present

## 2020-06-30 DIAGNOSIS — N319 Neuromuscular dysfunction of bladder, unspecified: Secondary | ICD-10-CM | POA: Diagnosis not present

## 2020-06-30 DIAGNOSIS — G35 Multiple sclerosis: Secondary | ICD-10-CM | POA: Diagnosis not present

## 2020-06-30 DIAGNOSIS — L89152 Pressure ulcer of sacral region, stage 2: Secondary | ICD-10-CM | POA: Diagnosis not present

## 2020-06-30 DIAGNOSIS — R1310 Dysphagia, unspecified: Secondary | ICD-10-CM | POA: Diagnosis not present

## 2020-06-30 DIAGNOSIS — E440 Moderate protein-calorie malnutrition: Secondary | ICD-10-CM | POA: Diagnosis not present

## 2020-06-30 DIAGNOSIS — I480 Paroxysmal atrial fibrillation: Secondary | ICD-10-CM | POA: Diagnosis not present

## 2020-07-03 DIAGNOSIS — E440 Moderate protein-calorie malnutrition: Secondary | ICD-10-CM | POA: Diagnosis not present

## 2020-07-03 DIAGNOSIS — R1310 Dysphagia, unspecified: Secondary | ICD-10-CM | POA: Diagnosis not present

## 2020-07-03 DIAGNOSIS — G35 Multiple sclerosis: Secondary | ICD-10-CM | POA: Diagnosis not present

## 2020-07-03 DIAGNOSIS — I480 Paroxysmal atrial fibrillation: Secondary | ICD-10-CM | POA: Diagnosis not present

## 2020-07-03 DIAGNOSIS — N319 Neuromuscular dysfunction of bladder, unspecified: Secondary | ICD-10-CM | POA: Diagnosis not present

## 2020-07-03 DIAGNOSIS — L89152 Pressure ulcer of sacral region, stage 2: Secondary | ICD-10-CM | POA: Diagnosis not present

## 2020-07-04 DIAGNOSIS — L89152 Pressure ulcer of sacral region, stage 2: Secondary | ICD-10-CM | POA: Diagnosis not present

## 2020-07-04 DIAGNOSIS — I480 Paroxysmal atrial fibrillation: Secondary | ICD-10-CM | POA: Diagnosis not present

## 2020-07-04 DIAGNOSIS — G35 Multiple sclerosis: Secondary | ICD-10-CM | POA: Diagnosis not present

## 2020-07-04 DIAGNOSIS — N319 Neuromuscular dysfunction of bladder, unspecified: Secondary | ICD-10-CM | POA: Diagnosis not present

## 2020-07-04 DIAGNOSIS — E440 Moderate protein-calorie malnutrition: Secondary | ICD-10-CM | POA: Diagnosis not present

## 2020-07-04 DIAGNOSIS — R1310 Dysphagia, unspecified: Secondary | ICD-10-CM | POA: Diagnosis not present

## 2020-07-05 DIAGNOSIS — E440 Moderate protein-calorie malnutrition: Secondary | ICD-10-CM | POA: Diagnosis not present

## 2020-07-05 DIAGNOSIS — G35 Multiple sclerosis: Secondary | ICD-10-CM | POA: Diagnosis not present

## 2020-07-05 DIAGNOSIS — L89152 Pressure ulcer of sacral region, stage 2: Secondary | ICD-10-CM | POA: Diagnosis not present

## 2020-07-05 DIAGNOSIS — N319 Neuromuscular dysfunction of bladder, unspecified: Secondary | ICD-10-CM | POA: Diagnosis not present

## 2020-07-05 DIAGNOSIS — I480 Paroxysmal atrial fibrillation: Secondary | ICD-10-CM | POA: Diagnosis not present

## 2020-07-05 DIAGNOSIS — R1310 Dysphagia, unspecified: Secondary | ICD-10-CM | POA: Diagnosis not present

## 2020-07-06 DIAGNOSIS — G35 Multiple sclerosis: Secondary | ICD-10-CM | POA: Diagnosis not present

## 2020-07-06 DIAGNOSIS — L89152 Pressure ulcer of sacral region, stage 2: Secondary | ICD-10-CM | POA: Diagnosis not present

## 2020-07-06 DIAGNOSIS — I480 Paroxysmal atrial fibrillation: Secondary | ICD-10-CM | POA: Diagnosis not present

## 2020-07-06 DIAGNOSIS — R1310 Dysphagia, unspecified: Secondary | ICD-10-CM | POA: Diagnosis not present

## 2020-07-06 DIAGNOSIS — N319 Neuromuscular dysfunction of bladder, unspecified: Secondary | ICD-10-CM | POA: Diagnosis not present

## 2020-07-06 DIAGNOSIS — E440 Moderate protein-calorie malnutrition: Secondary | ICD-10-CM | POA: Diagnosis not present

## 2020-07-07 DIAGNOSIS — E440 Moderate protein-calorie malnutrition: Secondary | ICD-10-CM | POA: Diagnosis not present

## 2020-07-07 DIAGNOSIS — I480 Paroxysmal atrial fibrillation: Secondary | ICD-10-CM | POA: Diagnosis not present

## 2020-07-07 DIAGNOSIS — G35 Multiple sclerosis: Secondary | ICD-10-CM | POA: Diagnosis not present

## 2020-07-07 DIAGNOSIS — L89152 Pressure ulcer of sacral region, stage 2: Secondary | ICD-10-CM | POA: Diagnosis not present

## 2020-07-07 DIAGNOSIS — N319 Neuromuscular dysfunction of bladder, unspecified: Secondary | ICD-10-CM | POA: Diagnosis not present

## 2020-07-07 DIAGNOSIS — R1310 Dysphagia, unspecified: Secondary | ICD-10-CM | POA: Diagnosis not present

## 2020-07-10 DIAGNOSIS — I480 Paroxysmal atrial fibrillation: Secondary | ICD-10-CM | POA: Diagnosis not present

## 2020-07-10 DIAGNOSIS — L89152 Pressure ulcer of sacral region, stage 2: Secondary | ICD-10-CM | POA: Diagnosis not present

## 2020-07-10 DIAGNOSIS — E440 Moderate protein-calorie malnutrition: Secondary | ICD-10-CM | POA: Diagnosis not present

## 2020-07-10 DIAGNOSIS — G35 Multiple sclerosis: Secondary | ICD-10-CM | POA: Diagnosis not present

## 2020-07-10 DIAGNOSIS — R1310 Dysphagia, unspecified: Secondary | ICD-10-CM | POA: Diagnosis not present

## 2020-07-10 DIAGNOSIS — N319 Neuromuscular dysfunction of bladder, unspecified: Secondary | ICD-10-CM | POA: Diagnosis not present

## 2020-07-14 DIAGNOSIS — E440 Moderate protein-calorie malnutrition: Secondary | ICD-10-CM | POA: Diagnosis not present

## 2020-07-14 DIAGNOSIS — L89152 Pressure ulcer of sacral region, stage 2: Secondary | ICD-10-CM | POA: Diagnosis not present

## 2020-07-14 DIAGNOSIS — R1310 Dysphagia, unspecified: Secondary | ICD-10-CM | POA: Diagnosis not present

## 2020-07-14 DIAGNOSIS — N319 Neuromuscular dysfunction of bladder, unspecified: Secondary | ICD-10-CM | POA: Diagnosis not present

## 2020-07-14 DIAGNOSIS — G35 Multiple sclerosis: Secondary | ICD-10-CM | POA: Diagnosis not present

## 2020-07-14 DIAGNOSIS — I480 Paroxysmal atrial fibrillation: Secondary | ICD-10-CM | POA: Diagnosis not present

## 2020-07-15 DIAGNOSIS — E440 Moderate protein-calorie malnutrition: Secondary | ICD-10-CM | POA: Diagnosis not present

## 2020-07-15 DIAGNOSIS — I480 Paroxysmal atrial fibrillation: Secondary | ICD-10-CM | POA: Diagnosis not present

## 2020-07-15 DIAGNOSIS — R1310 Dysphagia, unspecified: Secondary | ICD-10-CM | POA: Diagnosis not present

## 2020-07-15 DIAGNOSIS — N319 Neuromuscular dysfunction of bladder, unspecified: Secondary | ICD-10-CM | POA: Diagnosis not present

## 2020-07-15 DIAGNOSIS — L89152 Pressure ulcer of sacral region, stage 2: Secondary | ICD-10-CM | POA: Diagnosis not present

## 2020-07-15 DIAGNOSIS — G35 Multiple sclerosis: Secondary | ICD-10-CM | POA: Diagnosis not present

## 2020-07-17 DIAGNOSIS — I480 Paroxysmal atrial fibrillation: Secondary | ICD-10-CM | POA: Diagnosis not present

## 2020-07-17 DIAGNOSIS — L89152 Pressure ulcer of sacral region, stage 2: Secondary | ICD-10-CM | POA: Diagnosis not present

## 2020-07-17 DIAGNOSIS — N319 Neuromuscular dysfunction of bladder, unspecified: Secondary | ICD-10-CM | POA: Diagnosis not present

## 2020-07-17 DIAGNOSIS — R1310 Dysphagia, unspecified: Secondary | ICD-10-CM | POA: Diagnosis not present

## 2020-07-17 DIAGNOSIS — E440 Moderate protein-calorie malnutrition: Secondary | ICD-10-CM | POA: Diagnosis not present

## 2020-07-17 DIAGNOSIS — G35 Multiple sclerosis: Secondary | ICD-10-CM | POA: Diagnosis not present

## 2020-07-18 DIAGNOSIS — L89152 Pressure ulcer of sacral region, stage 2: Secondary | ICD-10-CM | POA: Diagnosis not present

## 2020-07-18 DIAGNOSIS — E440 Moderate protein-calorie malnutrition: Secondary | ICD-10-CM | POA: Diagnosis not present

## 2020-07-18 DIAGNOSIS — I480 Paroxysmal atrial fibrillation: Secondary | ICD-10-CM | POA: Diagnosis not present

## 2020-07-18 DIAGNOSIS — G35 Multiple sclerosis: Secondary | ICD-10-CM | POA: Diagnosis not present

## 2020-07-18 DIAGNOSIS — N319 Neuromuscular dysfunction of bladder, unspecified: Secondary | ICD-10-CM | POA: Diagnosis not present

## 2020-07-18 DIAGNOSIS — R1310 Dysphagia, unspecified: Secondary | ICD-10-CM | POA: Diagnosis not present

## 2020-07-20 DIAGNOSIS — N319 Neuromuscular dysfunction of bladder, unspecified: Secondary | ICD-10-CM | POA: Diagnosis not present

## 2020-07-20 DIAGNOSIS — E440 Moderate protein-calorie malnutrition: Secondary | ICD-10-CM | POA: Diagnosis not present

## 2020-07-20 DIAGNOSIS — R1310 Dysphagia, unspecified: Secondary | ICD-10-CM | POA: Diagnosis not present

## 2020-07-20 DIAGNOSIS — I480 Paroxysmal atrial fibrillation: Secondary | ICD-10-CM | POA: Diagnosis not present

## 2020-07-20 DIAGNOSIS — G35 Multiple sclerosis: Secondary | ICD-10-CM | POA: Diagnosis not present

## 2020-07-20 DIAGNOSIS — L89152 Pressure ulcer of sacral region, stage 2: Secondary | ICD-10-CM | POA: Diagnosis not present

## 2020-07-21 DIAGNOSIS — L89152 Pressure ulcer of sacral region, stage 2: Secondary | ICD-10-CM | POA: Diagnosis not present

## 2020-07-21 DIAGNOSIS — E440 Moderate protein-calorie malnutrition: Secondary | ICD-10-CM | POA: Diagnosis not present

## 2020-07-21 DIAGNOSIS — R1310 Dysphagia, unspecified: Secondary | ICD-10-CM | POA: Diagnosis not present

## 2020-07-21 DIAGNOSIS — G35 Multiple sclerosis: Secondary | ICD-10-CM | POA: Diagnosis not present

## 2020-07-21 DIAGNOSIS — N319 Neuromuscular dysfunction of bladder, unspecified: Secondary | ICD-10-CM | POA: Diagnosis not present

## 2020-07-21 DIAGNOSIS — I480 Paroxysmal atrial fibrillation: Secondary | ICD-10-CM | POA: Diagnosis not present

## 2020-07-22 DIAGNOSIS — R1310 Dysphagia, unspecified: Secondary | ICD-10-CM | POA: Diagnosis not present

## 2020-07-22 DIAGNOSIS — L89152 Pressure ulcer of sacral region, stage 2: Secondary | ICD-10-CM | POA: Diagnosis not present

## 2020-07-22 DIAGNOSIS — G35 Multiple sclerosis: Secondary | ICD-10-CM | POA: Diagnosis not present

## 2020-07-22 DIAGNOSIS — E440 Moderate protein-calorie malnutrition: Secondary | ICD-10-CM | POA: Diagnosis not present

## 2020-07-22 DIAGNOSIS — N319 Neuromuscular dysfunction of bladder, unspecified: Secondary | ICD-10-CM | POA: Diagnosis not present

## 2020-07-22 DIAGNOSIS — I480 Paroxysmal atrial fibrillation: Secondary | ICD-10-CM | POA: Diagnosis not present

## 2020-07-23 DIAGNOSIS — G35 Multiple sclerosis: Secondary | ICD-10-CM | POA: Diagnosis not present

## 2020-07-23 DIAGNOSIS — Z436 Encounter for attention to other artificial openings of urinary tract: Secondary | ICD-10-CM | POA: Diagnosis not present

## 2020-07-23 DIAGNOSIS — Z87440 Personal history of urinary (tract) infections: Secondary | ICD-10-CM | POA: Diagnosis not present

## 2020-07-23 DIAGNOSIS — Z8701 Personal history of pneumonia (recurrent): Secondary | ICD-10-CM | POA: Diagnosis not present

## 2020-07-23 DIAGNOSIS — L89152 Pressure ulcer of sacral region, stage 2: Secondary | ICD-10-CM | POA: Diagnosis not present

## 2020-07-23 DIAGNOSIS — I480 Paroxysmal atrial fibrillation: Secondary | ICD-10-CM | POA: Diagnosis not present

## 2020-07-23 DIAGNOSIS — D649 Anemia, unspecified: Secondary | ICD-10-CM | POA: Diagnosis not present

## 2020-07-23 DIAGNOSIS — N319 Neuromuscular dysfunction of bladder, unspecified: Secondary | ICD-10-CM | POA: Diagnosis not present

## 2020-07-23 DIAGNOSIS — K219 Gastro-esophageal reflux disease without esophagitis: Secondary | ICD-10-CM | POA: Diagnosis not present

## 2020-07-23 DIAGNOSIS — Z8619 Personal history of other infectious and parasitic diseases: Secondary | ICD-10-CM | POA: Diagnosis not present

## 2020-07-23 DIAGNOSIS — R1310 Dysphagia, unspecified: Secondary | ICD-10-CM | POA: Diagnosis not present

## 2020-07-23 DIAGNOSIS — E440 Moderate protein-calorie malnutrition: Secondary | ICD-10-CM | POA: Diagnosis not present

## 2020-07-24 DIAGNOSIS — E440 Moderate protein-calorie malnutrition: Secondary | ICD-10-CM | POA: Diagnosis not present

## 2020-07-24 DIAGNOSIS — G35 Multiple sclerosis: Secondary | ICD-10-CM | POA: Diagnosis not present

## 2020-07-24 DIAGNOSIS — R1310 Dysphagia, unspecified: Secondary | ICD-10-CM | POA: Diagnosis not present

## 2020-07-24 DIAGNOSIS — N319 Neuromuscular dysfunction of bladder, unspecified: Secondary | ICD-10-CM | POA: Diagnosis not present

## 2020-07-24 DIAGNOSIS — I480 Paroxysmal atrial fibrillation: Secondary | ICD-10-CM | POA: Diagnosis not present

## 2020-07-24 DIAGNOSIS — L89152 Pressure ulcer of sacral region, stage 2: Secondary | ICD-10-CM | POA: Diagnosis not present

## 2020-07-27 DIAGNOSIS — R1310 Dysphagia, unspecified: Secondary | ICD-10-CM | POA: Diagnosis not present

## 2020-07-27 DIAGNOSIS — I480 Paroxysmal atrial fibrillation: Secondary | ICD-10-CM | POA: Diagnosis not present

## 2020-07-27 DIAGNOSIS — G35 Multiple sclerosis: Secondary | ICD-10-CM | POA: Diagnosis not present

## 2020-07-27 DIAGNOSIS — N319 Neuromuscular dysfunction of bladder, unspecified: Secondary | ICD-10-CM | POA: Diagnosis not present

## 2020-07-27 DIAGNOSIS — L89152 Pressure ulcer of sacral region, stage 2: Secondary | ICD-10-CM | POA: Diagnosis not present

## 2020-07-27 DIAGNOSIS — E440 Moderate protein-calorie malnutrition: Secondary | ICD-10-CM | POA: Diagnosis not present

## 2020-07-28 DIAGNOSIS — I480 Paroxysmal atrial fibrillation: Secondary | ICD-10-CM | POA: Diagnosis not present

## 2020-07-28 DIAGNOSIS — R1310 Dysphagia, unspecified: Secondary | ICD-10-CM | POA: Diagnosis not present

## 2020-07-28 DIAGNOSIS — E440 Moderate protein-calorie malnutrition: Secondary | ICD-10-CM | POA: Diagnosis not present

## 2020-07-28 DIAGNOSIS — N319 Neuromuscular dysfunction of bladder, unspecified: Secondary | ICD-10-CM | POA: Diagnosis not present

## 2020-07-28 DIAGNOSIS — G35 Multiple sclerosis: Secondary | ICD-10-CM | POA: Diagnosis not present

## 2020-07-28 DIAGNOSIS — L89152 Pressure ulcer of sacral region, stage 2: Secondary | ICD-10-CM | POA: Diagnosis not present

## 2020-07-29 DIAGNOSIS — G35 Multiple sclerosis: Secondary | ICD-10-CM | POA: Diagnosis not present

## 2020-07-29 DIAGNOSIS — L89152 Pressure ulcer of sacral region, stage 2: Secondary | ICD-10-CM | POA: Diagnosis not present

## 2020-07-29 DIAGNOSIS — I480 Paroxysmal atrial fibrillation: Secondary | ICD-10-CM | POA: Diagnosis not present

## 2020-07-29 DIAGNOSIS — R1310 Dysphagia, unspecified: Secondary | ICD-10-CM | POA: Diagnosis not present

## 2020-07-29 DIAGNOSIS — E440 Moderate protein-calorie malnutrition: Secondary | ICD-10-CM | POA: Diagnosis not present

## 2020-07-29 DIAGNOSIS — N319 Neuromuscular dysfunction of bladder, unspecified: Secondary | ICD-10-CM | POA: Diagnosis not present

## 2020-07-30 DIAGNOSIS — N319 Neuromuscular dysfunction of bladder, unspecified: Secondary | ICD-10-CM | POA: Diagnosis not present

## 2020-07-30 DIAGNOSIS — I480 Paroxysmal atrial fibrillation: Secondary | ICD-10-CM | POA: Diagnosis not present

## 2020-07-30 DIAGNOSIS — G35 Multiple sclerosis: Secondary | ICD-10-CM | POA: Diagnosis not present

## 2020-07-30 DIAGNOSIS — L89152 Pressure ulcer of sacral region, stage 2: Secondary | ICD-10-CM | POA: Diagnosis not present

## 2020-07-30 DIAGNOSIS — R1310 Dysphagia, unspecified: Secondary | ICD-10-CM | POA: Diagnosis not present

## 2020-07-30 DIAGNOSIS — E440 Moderate protein-calorie malnutrition: Secondary | ICD-10-CM | POA: Diagnosis not present

## 2020-07-31 DIAGNOSIS — R1310 Dysphagia, unspecified: Secondary | ICD-10-CM | POA: Diagnosis not present

## 2020-07-31 DIAGNOSIS — I480 Paroxysmal atrial fibrillation: Secondary | ICD-10-CM | POA: Diagnosis not present

## 2020-07-31 DIAGNOSIS — L89152 Pressure ulcer of sacral region, stage 2: Secondary | ICD-10-CM | POA: Diagnosis not present

## 2020-07-31 DIAGNOSIS — G35 Multiple sclerosis: Secondary | ICD-10-CM | POA: Diagnosis not present

## 2020-07-31 DIAGNOSIS — E440 Moderate protein-calorie malnutrition: Secondary | ICD-10-CM | POA: Diagnosis not present

## 2020-07-31 DIAGNOSIS — N319 Neuromuscular dysfunction of bladder, unspecified: Secondary | ICD-10-CM | POA: Diagnosis not present

## 2020-08-01 DIAGNOSIS — I480 Paroxysmal atrial fibrillation: Secondary | ICD-10-CM | POA: Diagnosis not present

## 2020-08-01 DIAGNOSIS — E440 Moderate protein-calorie malnutrition: Secondary | ICD-10-CM | POA: Diagnosis not present

## 2020-08-01 DIAGNOSIS — N319 Neuromuscular dysfunction of bladder, unspecified: Secondary | ICD-10-CM | POA: Diagnosis not present

## 2020-08-01 DIAGNOSIS — R1310 Dysphagia, unspecified: Secondary | ICD-10-CM | POA: Diagnosis not present

## 2020-08-01 DIAGNOSIS — L89152 Pressure ulcer of sacral region, stage 2: Secondary | ICD-10-CM | POA: Diagnosis not present

## 2020-08-01 DIAGNOSIS — G35 Multiple sclerosis: Secondary | ICD-10-CM | POA: Diagnosis not present

## 2020-08-03 DIAGNOSIS — E440 Moderate protein-calorie malnutrition: Secondary | ICD-10-CM | POA: Diagnosis not present

## 2020-08-03 DIAGNOSIS — L89152 Pressure ulcer of sacral region, stage 2: Secondary | ICD-10-CM | POA: Diagnosis not present

## 2020-08-03 DIAGNOSIS — G35 Multiple sclerosis: Secondary | ICD-10-CM | POA: Diagnosis not present

## 2020-08-03 DIAGNOSIS — N319 Neuromuscular dysfunction of bladder, unspecified: Secondary | ICD-10-CM | POA: Diagnosis not present

## 2020-08-03 DIAGNOSIS — I480 Paroxysmal atrial fibrillation: Secondary | ICD-10-CM | POA: Diagnosis not present

## 2020-08-03 DIAGNOSIS — R1310 Dysphagia, unspecified: Secondary | ICD-10-CM | POA: Diagnosis not present

## 2020-08-04 DIAGNOSIS — L89152 Pressure ulcer of sacral region, stage 2: Secondary | ICD-10-CM | POA: Diagnosis not present

## 2020-08-04 DIAGNOSIS — N319 Neuromuscular dysfunction of bladder, unspecified: Secondary | ICD-10-CM | POA: Diagnosis not present

## 2020-08-04 DIAGNOSIS — I480 Paroxysmal atrial fibrillation: Secondary | ICD-10-CM | POA: Diagnosis not present

## 2020-08-04 DIAGNOSIS — G35 Multiple sclerosis: Secondary | ICD-10-CM | POA: Diagnosis not present

## 2020-08-04 DIAGNOSIS — E440 Moderate protein-calorie malnutrition: Secondary | ICD-10-CM | POA: Diagnosis not present

## 2020-08-04 DIAGNOSIS — R1310 Dysphagia, unspecified: Secondary | ICD-10-CM | POA: Diagnosis not present

## 2020-08-07 DIAGNOSIS — L89152 Pressure ulcer of sacral region, stage 2: Secondary | ICD-10-CM | POA: Diagnosis not present

## 2020-08-07 DIAGNOSIS — G35 Multiple sclerosis: Secondary | ICD-10-CM | POA: Diagnosis not present

## 2020-08-07 DIAGNOSIS — N319 Neuromuscular dysfunction of bladder, unspecified: Secondary | ICD-10-CM | POA: Diagnosis not present

## 2020-08-07 DIAGNOSIS — R1310 Dysphagia, unspecified: Secondary | ICD-10-CM | POA: Diagnosis not present

## 2020-08-07 DIAGNOSIS — E440 Moderate protein-calorie malnutrition: Secondary | ICD-10-CM | POA: Diagnosis not present

## 2020-08-07 DIAGNOSIS — I480 Paroxysmal atrial fibrillation: Secondary | ICD-10-CM | POA: Diagnosis not present

## 2020-08-08 DIAGNOSIS — I480 Paroxysmal atrial fibrillation: Secondary | ICD-10-CM | POA: Diagnosis not present

## 2020-08-08 DIAGNOSIS — N319 Neuromuscular dysfunction of bladder, unspecified: Secondary | ICD-10-CM | POA: Diagnosis not present

## 2020-08-08 DIAGNOSIS — G35 Multiple sclerosis: Secondary | ICD-10-CM | POA: Diagnosis not present

## 2020-08-08 DIAGNOSIS — L89152 Pressure ulcer of sacral region, stage 2: Secondary | ICD-10-CM | POA: Diagnosis not present

## 2020-08-08 DIAGNOSIS — R1310 Dysphagia, unspecified: Secondary | ICD-10-CM | POA: Diagnosis not present

## 2020-08-08 DIAGNOSIS — E440 Moderate protein-calorie malnutrition: Secondary | ICD-10-CM | POA: Diagnosis not present

## 2020-08-11 DIAGNOSIS — N319 Neuromuscular dysfunction of bladder, unspecified: Secondary | ICD-10-CM | POA: Diagnosis not present

## 2020-08-11 DIAGNOSIS — R1310 Dysphagia, unspecified: Secondary | ICD-10-CM | POA: Diagnosis not present

## 2020-08-11 DIAGNOSIS — G35 Multiple sclerosis: Secondary | ICD-10-CM | POA: Diagnosis not present

## 2020-08-11 DIAGNOSIS — L89152 Pressure ulcer of sacral region, stage 2: Secondary | ICD-10-CM | POA: Diagnosis not present

## 2020-08-11 DIAGNOSIS — I480 Paroxysmal atrial fibrillation: Secondary | ICD-10-CM | POA: Diagnosis not present

## 2020-08-11 DIAGNOSIS — E440 Moderate protein-calorie malnutrition: Secondary | ICD-10-CM | POA: Diagnosis not present

## 2020-08-13 DIAGNOSIS — R1310 Dysphagia, unspecified: Secondary | ICD-10-CM | POA: Diagnosis not present

## 2020-08-13 DIAGNOSIS — L89152 Pressure ulcer of sacral region, stage 2: Secondary | ICD-10-CM | POA: Diagnosis not present

## 2020-08-13 DIAGNOSIS — N319 Neuromuscular dysfunction of bladder, unspecified: Secondary | ICD-10-CM | POA: Diagnosis not present

## 2020-08-13 DIAGNOSIS — I480 Paroxysmal atrial fibrillation: Secondary | ICD-10-CM | POA: Diagnosis not present

## 2020-08-13 DIAGNOSIS — E440 Moderate protein-calorie malnutrition: Secondary | ICD-10-CM | POA: Diagnosis not present

## 2020-08-13 DIAGNOSIS — G35 Multiple sclerosis: Secondary | ICD-10-CM | POA: Diagnosis not present

## 2020-08-14 DIAGNOSIS — N319 Neuromuscular dysfunction of bladder, unspecified: Secondary | ICD-10-CM | POA: Diagnosis not present

## 2020-08-14 DIAGNOSIS — G35 Multiple sclerosis: Secondary | ICD-10-CM | POA: Diagnosis not present

## 2020-08-14 DIAGNOSIS — I480 Paroxysmal atrial fibrillation: Secondary | ICD-10-CM | POA: Diagnosis not present

## 2020-08-14 DIAGNOSIS — R1310 Dysphagia, unspecified: Secondary | ICD-10-CM | POA: Diagnosis not present

## 2020-08-14 DIAGNOSIS — E440 Moderate protein-calorie malnutrition: Secondary | ICD-10-CM | POA: Diagnosis not present

## 2020-08-14 DIAGNOSIS — L89152 Pressure ulcer of sacral region, stage 2: Secondary | ICD-10-CM | POA: Diagnosis not present

## 2020-08-15 DIAGNOSIS — E440 Moderate protein-calorie malnutrition: Secondary | ICD-10-CM | POA: Diagnosis not present

## 2020-08-15 DIAGNOSIS — R1310 Dysphagia, unspecified: Secondary | ICD-10-CM | POA: Diagnosis not present

## 2020-08-15 DIAGNOSIS — I480 Paroxysmal atrial fibrillation: Secondary | ICD-10-CM | POA: Diagnosis not present

## 2020-08-15 DIAGNOSIS — G35 Multiple sclerosis: Secondary | ICD-10-CM | POA: Diagnosis not present

## 2020-08-15 DIAGNOSIS — N319 Neuromuscular dysfunction of bladder, unspecified: Secondary | ICD-10-CM | POA: Diagnosis not present

## 2020-08-15 DIAGNOSIS — L89152 Pressure ulcer of sacral region, stage 2: Secondary | ICD-10-CM | POA: Diagnosis not present

## 2020-08-16 DIAGNOSIS — I480 Paroxysmal atrial fibrillation: Secondary | ICD-10-CM | POA: Diagnosis not present

## 2020-08-16 DIAGNOSIS — R1310 Dysphagia, unspecified: Secondary | ICD-10-CM | POA: Diagnosis not present

## 2020-08-16 DIAGNOSIS — E440 Moderate protein-calorie malnutrition: Secondary | ICD-10-CM | POA: Diagnosis not present

## 2020-08-16 DIAGNOSIS — N319 Neuromuscular dysfunction of bladder, unspecified: Secondary | ICD-10-CM | POA: Diagnosis not present

## 2020-08-16 DIAGNOSIS — G35 Multiple sclerosis: Secondary | ICD-10-CM | POA: Diagnosis not present

## 2020-08-16 DIAGNOSIS — L89152 Pressure ulcer of sacral region, stage 2: Secondary | ICD-10-CM | POA: Diagnosis not present

## 2020-08-18 DIAGNOSIS — I480 Paroxysmal atrial fibrillation: Secondary | ICD-10-CM | POA: Diagnosis not present

## 2020-08-18 DIAGNOSIS — E440 Moderate protein-calorie malnutrition: Secondary | ICD-10-CM | POA: Diagnosis not present

## 2020-08-18 DIAGNOSIS — L89152 Pressure ulcer of sacral region, stage 2: Secondary | ICD-10-CM | POA: Diagnosis not present

## 2020-08-18 DIAGNOSIS — R1310 Dysphagia, unspecified: Secondary | ICD-10-CM | POA: Diagnosis not present

## 2020-08-18 DIAGNOSIS — N319 Neuromuscular dysfunction of bladder, unspecified: Secondary | ICD-10-CM | POA: Diagnosis not present

## 2020-08-18 DIAGNOSIS — G35 Multiple sclerosis: Secondary | ICD-10-CM | POA: Diagnosis not present

## 2020-08-21 DIAGNOSIS — E440 Moderate protein-calorie malnutrition: Secondary | ICD-10-CM | POA: Diagnosis not present

## 2020-08-21 DIAGNOSIS — R1310 Dysphagia, unspecified: Secondary | ICD-10-CM | POA: Diagnosis not present

## 2020-08-21 DIAGNOSIS — G35 Multiple sclerosis: Secondary | ICD-10-CM | POA: Diagnosis not present

## 2020-08-21 DIAGNOSIS — L89152 Pressure ulcer of sacral region, stage 2: Secondary | ICD-10-CM | POA: Diagnosis not present

## 2020-08-21 DIAGNOSIS — I480 Paroxysmal atrial fibrillation: Secondary | ICD-10-CM | POA: Diagnosis not present

## 2020-08-21 DIAGNOSIS — N319 Neuromuscular dysfunction of bladder, unspecified: Secondary | ICD-10-CM | POA: Diagnosis not present

## 2020-08-22 DIAGNOSIS — G35 Multiple sclerosis: Secondary | ICD-10-CM | POA: Diagnosis not present

## 2020-08-22 DIAGNOSIS — N319 Neuromuscular dysfunction of bladder, unspecified: Secondary | ICD-10-CM | POA: Diagnosis not present

## 2020-08-22 DIAGNOSIS — I480 Paroxysmal atrial fibrillation: Secondary | ICD-10-CM | POA: Diagnosis not present

## 2020-08-22 DIAGNOSIS — R1310 Dysphagia, unspecified: Secondary | ICD-10-CM | POA: Diagnosis not present

## 2020-08-22 DIAGNOSIS — E440 Moderate protein-calorie malnutrition: Secondary | ICD-10-CM | POA: Diagnosis not present

## 2020-08-22 DIAGNOSIS — L89152 Pressure ulcer of sacral region, stage 2: Secondary | ICD-10-CM | POA: Diagnosis not present

## 2020-08-23 DIAGNOSIS — R1310 Dysphagia, unspecified: Secondary | ICD-10-CM | POA: Diagnosis not present

## 2020-08-23 DIAGNOSIS — K219 Gastro-esophageal reflux disease without esophagitis: Secondary | ICD-10-CM | POA: Diagnosis not present

## 2020-08-23 DIAGNOSIS — Z436 Encounter for attention to other artificial openings of urinary tract: Secondary | ICD-10-CM | POA: Diagnosis not present

## 2020-08-23 DIAGNOSIS — N319 Neuromuscular dysfunction of bladder, unspecified: Secondary | ICD-10-CM | POA: Diagnosis not present

## 2020-08-23 DIAGNOSIS — Z8701 Personal history of pneumonia (recurrent): Secondary | ICD-10-CM | POA: Diagnosis not present

## 2020-08-23 DIAGNOSIS — E440 Moderate protein-calorie malnutrition: Secondary | ICD-10-CM | POA: Diagnosis not present

## 2020-08-23 DIAGNOSIS — I480 Paroxysmal atrial fibrillation: Secondary | ICD-10-CM | POA: Diagnosis not present

## 2020-08-23 DIAGNOSIS — G35 Multiple sclerosis: Secondary | ICD-10-CM | POA: Diagnosis not present

## 2020-08-23 DIAGNOSIS — Z8619 Personal history of other infectious and parasitic diseases: Secondary | ICD-10-CM | POA: Diagnosis not present

## 2020-08-23 DIAGNOSIS — L89152 Pressure ulcer of sacral region, stage 2: Secondary | ICD-10-CM | POA: Diagnosis not present

## 2020-08-23 DIAGNOSIS — D649 Anemia, unspecified: Secondary | ICD-10-CM | POA: Diagnosis not present

## 2020-08-23 DIAGNOSIS — Z87440 Personal history of urinary (tract) infections: Secondary | ICD-10-CM | POA: Diagnosis not present

## 2020-08-25 DIAGNOSIS — L89152 Pressure ulcer of sacral region, stage 2: Secondary | ICD-10-CM | POA: Diagnosis not present

## 2020-08-25 DIAGNOSIS — E440 Moderate protein-calorie malnutrition: Secondary | ICD-10-CM | POA: Diagnosis not present

## 2020-08-25 DIAGNOSIS — I480 Paroxysmal atrial fibrillation: Secondary | ICD-10-CM | POA: Diagnosis not present

## 2020-08-25 DIAGNOSIS — G35 Multiple sclerosis: Secondary | ICD-10-CM | POA: Diagnosis not present

## 2020-08-25 DIAGNOSIS — R1310 Dysphagia, unspecified: Secondary | ICD-10-CM | POA: Diagnosis not present

## 2020-08-25 DIAGNOSIS — N319 Neuromuscular dysfunction of bladder, unspecified: Secondary | ICD-10-CM | POA: Diagnosis not present

## 2020-08-30 DIAGNOSIS — E440 Moderate protein-calorie malnutrition: Secondary | ICD-10-CM | POA: Diagnosis not present

## 2020-08-30 DIAGNOSIS — N319 Neuromuscular dysfunction of bladder, unspecified: Secondary | ICD-10-CM | POA: Diagnosis not present

## 2020-08-30 DIAGNOSIS — R1310 Dysphagia, unspecified: Secondary | ICD-10-CM | POA: Diagnosis not present

## 2020-08-30 DIAGNOSIS — L89152 Pressure ulcer of sacral region, stage 2: Secondary | ICD-10-CM | POA: Diagnosis not present

## 2020-08-30 DIAGNOSIS — G35 Multiple sclerosis: Secondary | ICD-10-CM | POA: Diagnosis not present

## 2020-08-30 DIAGNOSIS — I480 Paroxysmal atrial fibrillation: Secondary | ICD-10-CM | POA: Diagnosis not present

## 2020-08-31 DIAGNOSIS — G35 Multiple sclerosis: Secondary | ICD-10-CM | POA: Diagnosis not present

## 2020-08-31 DIAGNOSIS — I480 Paroxysmal atrial fibrillation: Secondary | ICD-10-CM | POA: Diagnosis not present

## 2020-08-31 DIAGNOSIS — R1310 Dysphagia, unspecified: Secondary | ICD-10-CM | POA: Diagnosis not present

## 2020-08-31 DIAGNOSIS — L89152 Pressure ulcer of sacral region, stage 2: Secondary | ICD-10-CM | POA: Diagnosis not present

## 2020-08-31 DIAGNOSIS — E440 Moderate protein-calorie malnutrition: Secondary | ICD-10-CM | POA: Diagnosis not present

## 2020-08-31 DIAGNOSIS — N319 Neuromuscular dysfunction of bladder, unspecified: Secondary | ICD-10-CM | POA: Diagnosis not present

## 2020-09-01 DIAGNOSIS — E440 Moderate protein-calorie malnutrition: Secondary | ICD-10-CM | POA: Diagnosis not present

## 2020-09-01 DIAGNOSIS — I480 Paroxysmal atrial fibrillation: Secondary | ICD-10-CM | POA: Diagnosis not present

## 2020-09-01 DIAGNOSIS — R1310 Dysphagia, unspecified: Secondary | ICD-10-CM | POA: Diagnosis not present

## 2020-09-01 DIAGNOSIS — L89152 Pressure ulcer of sacral region, stage 2: Secondary | ICD-10-CM | POA: Diagnosis not present

## 2020-09-01 DIAGNOSIS — G35 Multiple sclerosis: Secondary | ICD-10-CM | POA: Diagnosis not present

## 2020-09-01 DIAGNOSIS — N319 Neuromuscular dysfunction of bladder, unspecified: Secondary | ICD-10-CM | POA: Diagnosis not present

## 2020-09-04 DIAGNOSIS — I480 Paroxysmal atrial fibrillation: Secondary | ICD-10-CM | POA: Diagnosis not present

## 2020-09-04 DIAGNOSIS — G35 Multiple sclerosis: Secondary | ICD-10-CM | POA: Diagnosis not present

## 2020-09-04 DIAGNOSIS — L89152 Pressure ulcer of sacral region, stage 2: Secondary | ICD-10-CM | POA: Diagnosis not present

## 2020-09-04 DIAGNOSIS — E440 Moderate protein-calorie malnutrition: Secondary | ICD-10-CM | POA: Diagnosis not present

## 2020-09-04 DIAGNOSIS — N319 Neuromuscular dysfunction of bladder, unspecified: Secondary | ICD-10-CM | POA: Diagnosis not present

## 2020-09-04 DIAGNOSIS — R1310 Dysphagia, unspecified: Secondary | ICD-10-CM | POA: Diagnosis not present

## 2020-09-06 DIAGNOSIS — I480 Paroxysmal atrial fibrillation: Secondary | ICD-10-CM | POA: Diagnosis not present

## 2020-09-06 DIAGNOSIS — R1310 Dysphagia, unspecified: Secondary | ICD-10-CM | POA: Diagnosis not present

## 2020-09-06 DIAGNOSIS — N319 Neuromuscular dysfunction of bladder, unspecified: Secondary | ICD-10-CM | POA: Diagnosis not present

## 2020-09-06 DIAGNOSIS — E440 Moderate protein-calorie malnutrition: Secondary | ICD-10-CM | POA: Diagnosis not present

## 2020-09-06 DIAGNOSIS — L89152 Pressure ulcer of sacral region, stage 2: Secondary | ICD-10-CM | POA: Diagnosis not present

## 2020-09-06 DIAGNOSIS — G35 Multiple sclerosis: Secondary | ICD-10-CM | POA: Diagnosis not present

## 2020-09-08 DIAGNOSIS — E440 Moderate protein-calorie malnutrition: Secondary | ICD-10-CM | POA: Diagnosis not present

## 2020-09-08 DIAGNOSIS — L89152 Pressure ulcer of sacral region, stage 2: Secondary | ICD-10-CM | POA: Diagnosis not present

## 2020-09-08 DIAGNOSIS — N319 Neuromuscular dysfunction of bladder, unspecified: Secondary | ICD-10-CM | POA: Diagnosis not present

## 2020-09-08 DIAGNOSIS — I480 Paroxysmal atrial fibrillation: Secondary | ICD-10-CM | POA: Diagnosis not present

## 2020-09-08 DIAGNOSIS — R1310 Dysphagia, unspecified: Secondary | ICD-10-CM | POA: Diagnosis not present

## 2020-09-08 DIAGNOSIS — G35 Multiple sclerosis: Secondary | ICD-10-CM | POA: Diagnosis not present

## 2020-09-12 DIAGNOSIS — R1310 Dysphagia, unspecified: Secondary | ICD-10-CM | POA: Diagnosis not present

## 2020-09-12 DIAGNOSIS — G35 Multiple sclerosis: Secondary | ICD-10-CM | POA: Diagnosis not present

## 2020-09-12 DIAGNOSIS — L89152 Pressure ulcer of sacral region, stage 2: Secondary | ICD-10-CM | POA: Diagnosis not present

## 2020-09-12 DIAGNOSIS — N319 Neuromuscular dysfunction of bladder, unspecified: Secondary | ICD-10-CM | POA: Diagnosis not present

## 2020-09-12 DIAGNOSIS — I480 Paroxysmal atrial fibrillation: Secondary | ICD-10-CM | POA: Diagnosis not present

## 2020-09-12 DIAGNOSIS — E440 Moderate protein-calorie malnutrition: Secondary | ICD-10-CM | POA: Diagnosis not present

## 2020-09-13 DIAGNOSIS — L89152 Pressure ulcer of sacral region, stage 2: Secondary | ICD-10-CM | POA: Diagnosis not present

## 2020-09-13 DIAGNOSIS — N319 Neuromuscular dysfunction of bladder, unspecified: Secondary | ICD-10-CM | POA: Diagnosis not present

## 2020-09-13 DIAGNOSIS — R1310 Dysphagia, unspecified: Secondary | ICD-10-CM | POA: Diagnosis not present

## 2020-09-13 DIAGNOSIS — G35 Multiple sclerosis: Secondary | ICD-10-CM | POA: Diagnosis not present

## 2020-09-13 DIAGNOSIS — I480 Paroxysmal atrial fibrillation: Secondary | ICD-10-CM | POA: Diagnosis not present

## 2020-09-13 DIAGNOSIS — E440 Moderate protein-calorie malnutrition: Secondary | ICD-10-CM | POA: Diagnosis not present

## 2020-09-15 DIAGNOSIS — R1310 Dysphagia, unspecified: Secondary | ICD-10-CM | POA: Diagnosis not present

## 2020-09-15 DIAGNOSIS — E440 Moderate protein-calorie malnutrition: Secondary | ICD-10-CM | POA: Diagnosis not present

## 2020-09-15 DIAGNOSIS — G35 Multiple sclerosis: Secondary | ICD-10-CM | POA: Diagnosis not present

## 2020-09-15 DIAGNOSIS — N319 Neuromuscular dysfunction of bladder, unspecified: Secondary | ICD-10-CM | POA: Diagnosis not present

## 2020-09-15 DIAGNOSIS — I480 Paroxysmal atrial fibrillation: Secondary | ICD-10-CM | POA: Diagnosis not present

## 2020-09-15 DIAGNOSIS — L89152 Pressure ulcer of sacral region, stage 2: Secondary | ICD-10-CM | POA: Diagnosis not present

## 2020-09-16 DIAGNOSIS — R1310 Dysphagia, unspecified: Secondary | ICD-10-CM | POA: Diagnosis not present

## 2020-09-16 DIAGNOSIS — E440 Moderate protein-calorie malnutrition: Secondary | ICD-10-CM | POA: Diagnosis not present

## 2020-09-16 DIAGNOSIS — L89152 Pressure ulcer of sacral region, stage 2: Secondary | ICD-10-CM | POA: Diagnosis not present

## 2020-09-16 DIAGNOSIS — I480 Paroxysmal atrial fibrillation: Secondary | ICD-10-CM | POA: Diagnosis not present

## 2020-09-16 DIAGNOSIS — N319 Neuromuscular dysfunction of bladder, unspecified: Secondary | ICD-10-CM | POA: Diagnosis not present

## 2020-09-16 DIAGNOSIS — G35 Multiple sclerosis: Secondary | ICD-10-CM | POA: Diagnosis not present

## 2020-09-18 DIAGNOSIS — E440 Moderate protein-calorie malnutrition: Secondary | ICD-10-CM | POA: Diagnosis not present

## 2020-09-18 DIAGNOSIS — I480 Paroxysmal atrial fibrillation: Secondary | ICD-10-CM | POA: Diagnosis not present

## 2020-09-18 DIAGNOSIS — G35 Multiple sclerosis: Secondary | ICD-10-CM | POA: Diagnosis not present

## 2020-09-18 DIAGNOSIS — R1310 Dysphagia, unspecified: Secondary | ICD-10-CM | POA: Diagnosis not present

## 2020-09-18 DIAGNOSIS — L89152 Pressure ulcer of sacral region, stage 2: Secondary | ICD-10-CM | POA: Diagnosis not present

## 2020-09-18 DIAGNOSIS — N319 Neuromuscular dysfunction of bladder, unspecified: Secondary | ICD-10-CM | POA: Diagnosis not present

## 2020-09-20 DIAGNOSIS — N319 Neuromuscular dysfunction of bladder, unspecified: Secondary | ICD-10-CM | POA: Diagnosis not present

## 2020-09-20 DIAGNOSIS — I480 Paroxysmal atrial fibrillation: Secondary | ICD-10-CM | POA: Diagnosis not present

## 2020-09-20 DIAGNOSIS — L89152 Pressure ulcer of sacral region, stage 2: Secondary | ICD-10-CM | POA: Diagnosis not present

## 2020-09-20 DIAGNOSIS — G35 Multiple sclerosis: Secondary | ICD-10-CM | POA: Diagnosis not present

## 2020-09-20 DIAGNOSIS — R1310 Dysphagia, unspecified: Secondary | ICD-10-CM | POA: Diagnosis not present

## 2020-09-20 DIAGNOSIS — E440 Moderate protein-calorie malnutrition: Secondary | ICD-10-CM | POA: Diagnosis not present

## 2020-09-21 DIAGNOSIS — G35 Multiple sclerosis: Secondary | ICD-10-CM | POA: Diagnosis not present

## 2020-09-21 DIAGNOSIS — E440 Moderate protein-calorie malnutrition: Secondary | ICD-10-CM | POA: Diagnosis not present

## 2020-09-21 DIAGNOSIS — I480 Paroxysmal atrial fibrillation: Secondary | ICD-10-CM | POA: Diagnosis not present

## 2020-09-21 DIAGNOSIS — L89152 Pressure ulcer of sacral region, stage 2: Secondary | ICD-10-CM | POA: Diagnosis not present

## 2020-09-21 DIAGNOSIS — R1310 Dysphagia, unspecified: Secondary | ICD-10-CM | POA: Diagnosis not present

## 2020-09-21 DIAGNOSIS — N319 Neuromuscular dysfunction of bladder, unspecified: Secondary | ICD-10-CM | POA: Diagnosis not present

## 2020-09-22 DIAGNOSIS — E440 Moderate protein-calorie malnutrition: Secondary | ICD-10-CM | POA: Diagnosis not present

## 2020-09-22 DIAGNOSIS — N319 Neuromuscular dysfunction of bladder, unspecified: Secondary | ICD-10-CM | POA: Diagnosis not present

## 2020-09-22 DIAGNOSIS — I480 Paroxysmal atrial fibrillation: Secondary | ICD-10-CM | POA: Diagnosis not present

## 2020-09-22 DIAGNOSIS — L89152 Pressure ulcer of sacral region, stage 2: Secondary | ICD-10-CM | POA: Diagnosis not present

## 2020-09-22 DIAGNOSIS — Z8619 Personal history of other infectious and parasitic diseases: Secondary | ICD-10-CM | POA: Diagnosis not present

## 2020-09-22 DIAGNOSIS — G35 Multiple sclerosis: Secondary | ICD-10-CM | POA: Diagnosis not present

## 2020-09-22 DIAGNOSIS — Z8701 Personal history of pneumonia (recurrent): Secondary | ICD-10-CM | POA: Diagnosis not present

## 2020-09-22 DIAGNOSIS — K219 Gastro-esophageal reflux disease without esophagitis: Secondary | ICD-10-CM | POA: Diagnosis not present

## 2020-09-22 DIAGNOSIS — Z436 Encounter for attention to other artificial openings of urinary tract: Secondary | ICD-10-CM | POA: Diagnosis not present

## 2020-09-22 DIAGNOSIS — D649 Anemia, unspecified: Secondary | ICD-10-CM | POA: Diagnosis not present

## 2020-09-22 DIAGNOSIS — R1310 Dysphagia, unspecified: Secondary | ICD-10-CM | POA: Diagnosis not present

## 2020-09-22 DIAGNOSIS — Z87440 Personal history of urinary (tract) infections: Secondary | ICD-10-CM | POA: Diagnosis not present

## 2020-09-25 DIAGNOSIS — N319 Neuromuscular dysfunction of bladder, unspecified: Secondary | ICD-10-CM | POA: Diagnosis not present

## 2020-09-25 DIAGNOSIS — E440 Moderate protein-calorie malnutrition: Secondary | ICD-10-CM | POA: Diagnosis not present

## 2020-09-25 DIAGNOSIS — L89152 Pressure ulcer of sacral region, stage 2: Secondary | ICD-10-CM | POA: Diagnosis not present

## 2020-09-25 DIAGNOSIS — G35 Multiple sclerosis: Secondary | ICD-10-CM | POA: Diagnosis not present

## 2020-09-25 DIAGNOSIS — R1310 Dysphagia, unspecified: Secondary | ICD-10-CM | POA: Diagnosis not present

## 2020-09-25 DIAGNOSIS — I480 Paroxysmal atrial fibrillation: Secondary | ICD-10-CM | POA: Diagnosis not present

## 2020-09-26 DIAGNOSIS — R1310 Dysphagia, unspecified: Secondary | ICD-10-CM | POA: Diagnosis not present

## 2020-09-26 DIAGNOSIS — N319 Neuromuscular dysfunction of bladder, unspecified: Secondary | ICD-10-CM | POA: Diagnosis not present

## 2020-09-26 DIAGNOSIS — E440 Moderate protein-calorie malnutrition: Secondary | ICD-10-CM | POA: Diagnosis not present

## 2020-09-26 DIAGNOSIS — G35 Multiple sclerosis: Secondary | ICD-10-CM | POA: Diagnosis not present

## 2020-09-26 DIAGNOSIS — L89152 Pressure ulcer of sacral region, stage 2: Secondary | ICD-10-CM | POA: Diagnosis not present

## 2020-09-26 DIAGNOSIS — I480 Paroxysmal atrial fibrillation: Secondary | ICD-10-CM | POA: Diagnosis not present

## 2020-09-29 DIAGNOSIS — G35 Multiple sclerosis: Secondary | ICD-10-CM | POA: Diagnosis not present

## 2020-09-29 DIAGNOSIS — E440 Moderate protein-calorie malnutrition: Secondary | ICD-10-CM | POA: Diagnosis not present

## 2020-09-29 DIAGNOSIS — I480 Paroxysmal atrial fibrillation: Secondary | ICD-10-CM | POA: Diagnosis not present

## 2020-09-29 DIAGNOSIS — R1310 Dysphagia, unspecified: Secondary | ICD-10-CM | POA: Diagnosis not present

## 2020-09-29 DIAGNOSIS — N319 Neuromuscular dysfunction of bladder, unspecified: Secondary | ICD-10-CM | POA: Diagnosis not present

## 2020-09-29 DIAGNOSIS — L89152 Pressure ulcer of sacral region, stage 2: Secondary | ICD-10-CM | POA: Diagnosis not present

## 2020-09-30 DIAGNOSIS — L89152 Pressure ulcer of sacral region, stage 2: Secondary | ICD-10-CM | POA: Diagnosis not present

## 2020-09-30 DIAGNOSIS — G35 Multiple sclerosis: Secondary | ICD-10-CM | POA: Diagnosis not present

## 2020-09-30 DIAGNOSIS — R1310 Dysphagia, unspecified: Secondary | ICD-10-CM | POA: Diagnosis not present

## 2020-09-30 DIAGNOSIS — E440 Moderate protein-calorie malnutrition: Secondary | ICD-10-CM | POA: Diagnosis not present

## 2020-09-30 DIAGNOSIS — N319 Neuromuscular dysfunction of bladder, unspecified: Secondary | ICD-10-CM | POA: Diagnosis not present

## 2020-09-30 DIAGNOSIS — I480 Paroxysmal atrial fibrillation: Secondary | ICD-10-CM | POA: Diagnosis not present

## 2020-10-01 DIAGNOSIS — N319 Neuromuscular dysfunction of bladder, unspecified: Secondary | ICD-10-CM | POA: Diagnosis not present

## 2020-10-01 DIAGNOSIS — E440 Moderate protein-calorie malnutrition: Secondary | ICD-10-CM | POA: Diagnosis not present

## 2020-10-01 DIAGNOSIS — G35 Multiple sclerosis: Secondary | ICD-10-CM | POA: Diagnosis not present

## 2020-10-01 DIAGNOSIS — I480 Paroxysmal atrial fibrillation: Secondary | ICD-10-CM | POA: Diagnosis not present

## 2020-10-01 DIAGNOSIS — R1310 Dysphagia, unspecified: Secondary | ICD-10-CM | POA: Diagnosis not present

## 2020-10-01 DIAGNOSIS — L89152 Pressure ulcer of sacral region, stage 2: Secondary | ICD-10-CM | POA: Diagnosis not present

## 2020-10-02 DIAGNOSIS — L89152 Pressure ulcer of sacral region, stage 2: Secondary | ICD-10-CM | POA: Diagnosis not present

## 2020-10-02 DIAGNOSIS — R1310 Dysphagia, unspecified: Secondary | ICD-10-CM | POA: Diagnosis not present

## 2020-10-02 DIAGNOSIS — N319 Neuromuscular dysfunction of bladder, unspecified: Secondary | ICD-10-CM | POA: Diagnosis not present

## 2020-10-02 DIAGNOSIS — E440 Moderate protein-calorie malnutrition: Secondary | ICD-10-CM | POA: Diagnosis not present

## 2020-10-02 DIAGNOSIS — G35 Multiple sclerosis: Secondary | ICD-10-CM | POA: Diagnosis not present

## 2020-10-02 DIAGNOSIS — I480 Paroxysmal atrial fibrillation: Secondary | ICD-10-CM | POA: Diagnosis not present

## 2020-10-03 DIAGNOSIS — N319 Neuromuscular dysfunction of bladder, unspecified: Secondary | ICD-10-CM | POA: Diagnosis not present

## 2020-10-03 DIAGNOSIS — G35 Multiple sclerosis: Secondary | ICD-10-CM | POA: Diagnosis not present

## 2020-10-03 DIAGNOSIS — E440 Moderate protein-calorie malnutrition: Secondary | ICD-10-CM | POA: Diagnosis not present

## 2020-10-03 DIAGNOSIS — I480 Paroxysmal atrial fibrillation: Secondary | ICD-10-CM | POA: Diagnosis not present

## 2020-10-03 DIAGNOSIS — R1310 Dysphagia, unspecified: Secondary | ICD-10-CM | POA: Diagnosis not present

## 2020-10-03 DIAGNOSIS — L89152 Pressure ulcer of sacral region, stage 2: Secondary | ICD-10-CM | POA: Diagnosis not present

## 2020-10-04 DIAGNOSIS — R1310 Dysphagia, unspecified: Secondary | ICD-10-CM | POA: Diagnosis not present

## 2020-10-04 DIAGNOSIS — G35 Multiple sclerosis: Secondary | ICD-10-CM | POA: Diagnosis not present

## 2020-10-04 DIAGNOSIS — E440 Moderate protein-calorie malnutrition: Secondary | ICD-10-CM | POA: Diagnosis not present

## 2020-10-04 DIAGNOSIS — N319 Neuromuscular dysfunction of bladder, unspecified: Secondary | ICD-10-CM | POA: Diagnosis not present

## 2020-10-04 DIAGNOSIS — L89152 Pressure ulcer of sacral region, stage 2: Secondary | ICD-10-CM | POA: Diagnosis not present

## 2020-10-04 DIAGNOSIS — I480 Paroxysmal atrial fibrillation: Secondary | ICD-10-CM | POA: Diagnosis not present

## 2020-10-05 DIAGNOSIS — N319 Neuromuscular dysfunction of bladder, unspecified: Secondary | ICD-10-CM | POA: Diagnosis not present

## 2020-10-05 DIAGNOSIS — L89152 Pressure ulcer of sacral region, stage 2: Secondary | ICD-10-CM | POA: Diagnosis not present

## 2020-10-05 DIAGNOSIS — I480 Paroxysmal atrial fibrillation: Secondary | ICD-10-CM | POA: Diagnosis not present

## 2020-10-05 DIAGNOSIS — E440 Moderate protein-calorie malnutrition: Secondary | ICD-10-CM | POA: Diagnosis not present

## 2020-10-05 DIAGNOSIS — G35 Multiple sclerosis: Secondary | ICD-10-CM | POA: Diagnosis not present

## 2020-10-05 DIAGNOSIS — R1310 Dysphagia, unspecified: Secondary | ICD-10-CM | POA: Diagnosis not present

## 2020-10-06 DIAGNOSIS — N319 Neuromuscular dysfunction of bladder, unspecified: Secondary | ICD-10-CM | POA: Diagnosis not present

## 2020-10-06 DIAGNOSIS — L89152 Pressure ulcer of sacral region, stage 2: Secondary | ICD-10-CM | POA: Diagnosis not present

## 2020-10-06 DIAGNOSIS — R1310 Dysphagia, unspecified: Secondary | ICD-10-CM | POA: Diagnosis not present

## 2020-10-06 DIAGNOSIS — I480 Paroxysmal atrial fibrillation: Secondary | ICD-10-CM | POA: Diagnosis not present

## 2020-10-06 DIAGNOSIS — G35 Multiple sclerosis: Secondary | ICD-10-CM | POA: Diagnosis not present

## 2020-10-06 DIAGNOSIS — E440 Moderate protein-calorie malnutrition: Secondary | ICD-10-CM | POA: Diagnosis not present

## 2020-10-09 DIAGNOSIS — N319 Neuromuscular dysfunction of bladder, unspecified: Secondary | ICD-10-CM | POA: Diagnosis not present

## 2020-10-09 DIAGNOSIS — G35 Multiple sclerosis: Secondary | ICD-10-CM | POA: Diagnosis not present

## 2020-10-09 DIAGNOSIS — R1310 Dysphagia, unspecified: Secondary | ICD-10-CM | POA: Diagnosis not present

## 2020-10-09 DIAGNOSIS — L89152 Pressure ulcer of sacral region, stage 2: Secondary | ICD-10-CM | POA: Diagnosis not present

## 2020-10-09 DIAGNOSIS — E440 Moderate protein-calorie malnutrition: Secondary | ICD-10-CM | POA: Diagnosis not present

## 2020-10-09 DIAGNOSIS — I480 Paroxysmal atrial fibrillation: Secondary | ICD-10-CM | POA: Diagnosis not present

## 2020-10-11 DIAGNOSIS — G35 Multiple sclerosis: Secondary | ICD-10-CM | POA: Diagnosis not present

## 2020-10-11 DIAGNOSIS — E440 Moderate protein-calorie malnutrition: Secondary | ICD-10-CM | POA: Diagnosis not present

## 2020-10-11 DIAGNOSIS — L89152 Pressure ulcer of sacral region, stage 2: Secondary | ICD-10-CM | POA: Diagnosis not present

## 2020-10-11 DIAGNOSIS — N319 Neuromuscular dysfunction of bladder, unspecified: Secondary | ICD-10-CM | POA: Diagnosis not present

## 2020-10-11 DIAGNOSIS — R1310 Dysphagia, unspecified: Secondary | ICD-10-CM | POA: Diagnosis not present

## 2020-10-11 DIAGNOSIS — I480 Paroxysmal atrial fibrillation: Secondary | ICD-10-CM | POA: Diagnosis not present

## 2020-10-13 DIAGNOSIS — G35 Multiple sclerosis: Secondary | ICD-10-CM | POA: Diagnosis not present

## 2020-10-13 DIAGNOSIS — E440 Moderate protein-calorie malnutrition: Secondary | ICD-10-CM | POA: Diagnosis not present

## 2020-10-13 DIAGNOSIS — N319 Neuromuscular dysfunction of bladder, unspecified: Secondary | ICD-10-CM | POA: Diagnosis not present

## 2020-10-13 DIAGNOSIS — L89152 Pressure ulcer of sacral region, stage 2: Secondary | ICD-10-CM | POA: Diagnosis not present

## 2020-10-13 DIAGNOSIS — R1310 Dysphagia, unspecified: Secondary | ICD-10-CM | POA: Diagnosis not present

## 2020-10-13 DIAGNOSIS — I480 Paroxysmal atrial fibrillation: Secondary | ICD-10-CM | POA: Diagnosis not present

## 2020-10-16 DIAGNOSIS — I480 Paroxysmal atrial fibrillation: Secondary | ICD-10-CM | POA: Diagnosis not present

## 2020-10-16 DIAGNOSIS — E440 Moderate protein-calorie malnutrition: Secondary | ICD-10-CM | POA: Diagnosis not present

## 2020-10-16 DIAGNOSIS — N319 Neuromuscular dysfunction of bladder, unspecified: Secondary | ICD-10-CM | POA: Diagnosis not present

## 2020-10-16 DIAGNOSIS — G35 Multiple sclerosis: Secondary | ICD-10-CM | POA: Diagnosis not present

## 2020-10-16 DIAGNOSIS — L89152 Pressure ulcer of sacral region, stage 2: Secondary | ICD-10-CM | POA: Diagnosis not present

## 2020-10-16 DIAGNOSIS — R1310 Dysphagia, unspecified: Secondary | ICD-10-CM | POA: Diagnosis not present

## 2020-10-18 DIAGNOSIS — I480 Paroxysmal atrial fibrillation: Secondary | ICD-10-CM | POA: Diagnosis not present

## 2020-10-18 DIAGNOSIS — R1310 Dysphagia, unspecified: Secondary | ICD-10-CM | POA: Diagnosis not present

## 2020-10-18 DIAGNOSIS — L89152 Pressure ulcer of sacral region, stage 2: Secondary | ICD-10-CM | POA: Diagnosis not present

## 2020-10-18 DIAGNOSIS — G35 Multiple sclerosis: Secondary | ICD-10-CM | POA: Diagnosis not present

## 2020-10-18 DIAGNOSIS — E440 Moderate protein-calorie malnutrition: Secondary | ICD-10-CM | POA: Diagnosis not present

## 2020-10-18 DIAGNOSIS — N319 Neuromuscular dysfunction of bladder, unspecified: Secondary | ICD-10-CM | POA: Diagnosis not present

## 2020-10-20 DIAGNOSIS — G35 Multiple sclerosis: Secondary | ICD-10-CM | POA: Diagnosis not present

## 2020-10-20 DIAGNOSIS — I480 Paroxysmal atrial fibrillation: Secondary | ICD-10-CM | POA: Diagnosis not present

## 2020-10-20 DIAGNOSIS — R1310 Dysphagia, unspecified: Secondary | ICD-10-CM | POA: Diagnosis not present

## 2020-10-20 DIAGNOSIS — L89152 Pressure ulcer of sacral region, stage 2: Secondary | ICD-10-CM | POA: Diagnosis not present

## 2020-10-20 DIAGNOSIS — E440 Moderate protein-calorie malnutrition: Secondary | ICD-10-CM | POA: Diagnosis not present

## 2020-10-20 DIAGNOSIS — N319 Neuromuscular dysfunction of bladder, unspecified: Secondary | ICD-10-CM | POA: Diagnosis not present

## 2020-10-23 DIAGNOSIS — K219 Gastro-esophageal reflux disease without esophagitis: Secondary | ICD-10-CM | POA: Diagnosis not present

## 2020-10-23 DIAGNOSIS — Z436 Encounter for attention to other artificial openings of urinary tract: Secondary | ICD-10-CM | POA: Diagnosis not present

## 2020-10-23 DIAGNOSIS — L89152 Pressure ulcer of sacral region, stage 2: Secondary | ICD-10-CM | POA: Diagnosis not present

## 2020-10-23 DIAGNOSIS — I480 Paroxysmal atrial fibrillation: Secondary | ICD-10-CM | POA: Diagnosis not present

## 2020-10-23 DIAGNOSIS — Z8619 Personal history of other infectious and parasitic diseases: Secondary | ICD-10-CM | POA: Diagnosis not present

## 2020-10-23 DIAGNOSIS — Z87440 Personal history of urinary (tract) infections: Secondary | ICD-10-CM | POA: Diagnosis not present

## 2020-10-23 DIAGNOSIS — E440 Moderate protein-calorie malnutrition: Secondary | ICD-10-CM | POA: Diagnosis not present

## 2020-10-23 DIAGNOSIS — G35 Multiple sclerosis: Secondary | ICD-10-CM | POA: Diagnosis not present

## 2020-10-23 DIAGNOSIS — R1310 Dysphagia, unspecified: Secondary | ICD-10-CM | POA: Diagnosis not present

## 2020-10-23 DIAGNOSIS — Z8701 Personal history of pneumonia (recurrent): Secondary | ICD-10-CM | POA: Diagnosis not present

## 2020-10-23 DIAGNOSIS — N319 Neuromuscular dysfunction of bladder, unspecified: Secondary | ICD-10-CM | POA: Diagnosis not present

## 2020-10-23 DIAGNOSIS — D649 Anemia, unspecified: Secondary | ICD-10-CM | POA: Diagnosis not present

## 2020-10-26 DIAGNOSIS — I480 Paroxysmal atrial fibrillation: Secondary | ICD-10-CM | POA: Diagnosis not present

## 2020-10-26 DIAGNOSIS — N319 Neuromuscular dysfunction of bladder, unspecified: Secondary | ICD-10-CM | POA: Diagnosis not present

## 2020-10-26 DIAGNOSIS — R1310 Dysphagia, unspecified: Secondary | ICD-10-CM | POA: Diagnosis not present

## 2020-10-26 DIAGNOSIS — L89152 Pressure ulcer of sacral region, stage 2: Secondary | ICD-10-CM | POA: Diagnosis not present

## 2020-10-26 DIAGNOSIS — G35 Multiple sclerosis: Secondary | ICD-10-CM | POA: Diagnosis not present

## 2020-10-26 DIAGNOSIS — E440 Moderate protein-calorie malnutrition: Secondary | ICD-10-CM | POA: Diagnosis not present

## 2020-10-27 DIAGNOSIS — I480 Paroxysmal atrial fibrillation: Secondary | ICD-10-CM | POA: Diagnosis not present

## 2020-10-27 DIAGNOSIS — N319 Neuromuscular dysfunction of bladder, unspecified: Secondary | ICD-10-CM | POA: Diagnosis not present

## 2020-10-27 DIAGNOSIS — G35 Multiple sclerosis: Secondary | ICD-10-CM | POA: Diagnosis not present

## 2020-10-27 DIAGNOSIS — R1310 Dysphagia, unspecified: Secondary | ICD-10-CM | POA: Diagnosis not present

## 2020-10-27 DIAGNOSIS — L89152 Pressure ulcer of sacral region, stage 2: Secondary | ICD-10-CM | POA: Diagnosis not present

## 2020-10-27 DIAGNOSIS — E440 Moderate protein-calorie malnutrition: Secondary | ICD-10-CM | POA: Diagnosis not present

## 2020-10-30 DIAGNOSIS — E440 Moderate protein-calorie malnutrition: Secondary | ICD-10-CM | POA: Diagnosis not present

## 2020-10-30 DIAGNOSIS — G35 Multiple sclerosis: Secondary | ICD-10-CM | POA: Diagnosis not present

## 2020-10-30 DIAGNOSIS — N319 Neuromuscular dysfunction of bladder, unspecified: Secondary | ICD-10-CM | POA: Diagnosis not present

## 2020-10-30 DIAGNOSIS — R1310 Dysphagia, unspecified: Secondary | ICD-10-CM | POA: Diagnosis not present

## 2020-10-30 DIAGNOSIS — I480 Paroxysmal atrial fibrillation: Secondary | ICD-10-CM | POA: Diagnosis not present

## 2020-10-30 DIAGNOSIS — L89152 Pressure ulcer of sacral region, stage 2: Secondary | ICD-10-CM | POA: Diagnosis not present

## 2020-11-03 DIAGNOSIS — I480 Paroxysmal atrial fibrillation: Secondary | ICD-10-CM | POA: Diagnosis not present

## 2020-11-03 DIAGNOSIS — R1310 Dysphagia, unspecified: Secondary | ICD-10-CM | POA: Diagnosis not present

## 2020-11-03 DIAGNOSIS — G35 Multiple sclerosis: Secondary | ICD-10-CM | POA: Diagnosis not present

## 2020-11-03 DIAGNOSIS — N319 Neuromuscular dysfunction of bladder, unspecified: Secondary | ICD-10-CM | POA: Diagnosis not present

## 2020-11-03 DIAGNOSIS — L89152 Pressure ulcer of sacral region, stage 2: Secondary | ICD-10-CM | POA: Diagnosis not present

## 2020-11-03 DIAGNOSIS — E440 Moderate protein-calorie malnutrition: Secondary | ICD-10-CM | POA: Diagnosis not present

## 2020-11-06 DIAGNOSIS — E440 Moderate protein-calorie malnutrition: Secondary | ICD-10-CM | POA: Diagnosis not present

## 2020-11-06 DIAGNOSIS — R1310 Dysphagia, unspecified: Secondary | ICD-10-CM | POA: Diagnosis not present

## 2020-11-06 DIAGNOSIS — I480 Paroxysmal atrial fibrillation: Secondary | ICD-10-CM | POA: Diagnosis not present

## 2020-11-06 DIAGNOSIS — G35 Multiple sclerosis: Secondary | ICD-10-CM | POA: Diagnosis not present

## 2020-11-06 DIAGNOSIS — N319 Neuromuscular dysfunction of bladder, unspecified: Secondary | ICD-10-CM | POA: Diagnosis not present

## 2020-11-06 DIAGNOSIS — L89152 Pressure ulcer of sacral region, stage 2: Secondary | ICD-10-CM | POA: Diagnosis not present

## 2020-11-10 DIAGNOSIS — R1310 Dysphagia, unspecified: Secondary | ICD-10-CM | POA: Diagnosis not present

## 2020-11-10 DIAGNOSIS — N319 Neuromuscular dysfunction of bladder, unspecified: Secondary | ICD-10-CM | POA: Diagnosis not present

## 2020-11-10 DIAGNOSIS — E440 Moderate protein-calorie malnutrition: Secondary | ICD-10-CM | POA: Diagnosis not present

## 2020-11-10 DIAGNOSIS — I480 Paroxysmal atrial fibrillation: Secondary | ICD-10-CM | POA: Diagnosis not present

## 2020-11-10 DIAGNOSIS — G35 Multiple sclerosis: Secondary | ICD-10-CM | POA: Diagnosis not present

## 2020-11-10 DIAGNOSIS — L89152 Pressure ulcer of sacral region, stage 2: Secondary | ICD-10-CM | POA: Diagnosis not present

## 2020-11-12 DIAGNOSIS — I480 Paroxysmal atrial fibrillation: Secondary | ICD-10-CM | POA: Diagnosis not present

## 2020-11-12 DIAGNOSIS — E440 Moderate protein-calorie malnutrition: Secondary | ICD-10-CM | POA: Diagnosis not present

## 2020-11-12 DIAGNOSIS — L89152 Pressure ulcer of sacral region, stage 2: Secondary | ICD-10-CM | POA: Diagnosis not present

## 2020-11-12 DIAGNOSIS — R1310 Dysphagia, unspecified: Secondary | ICD-10-CM | POA: Diagnosis not present

## 2020-11-12 DIAGNOSIS — N319 Neuromuscular dysfunction of bladder, unspecified: Secondary | ICD-10-CM | POA: Diagnosis not present

## 2020-11-12 DIAGNOSIS — G35 Multiple sclerosis: Secondary | ICD-10-CM | POA: Diagnosis not present

## 2020-11-13 DIAGNOSIS — G35 Multiple sclerosis: Secondary | ICD-10-CM | POA: Diagnosis not present

## 2020-11-13 DIAGNOSIS — E440 Moderate protein-calorie malnutrition: Secondary | ICD-10-CM | POA: Diagnosis not present

## 2020-11-13 DIAGNOSIS — N319 Neuromuscular dysfunction of bladder, unspecified: Secondary | ICD-10-CM | POA: Diagnosis not present

## 2020-11-13 DIAGNOSIS — L89152 Pressure ulcer of sacral region, stage 2: Secondary | ICD-10-CM | POA: Diagnosis not present

## 2020-11-13 DIAGNOSIS — I480 Paroxysmal atrial fibrillation: Secondary | ICD-10-CM | POA: Diagnosis not present

## 2020-11-13 DIAGNOSIS — R1310 Dysphagia, unspecified: Secondary | ICD-10-CM | POA: Diagnosis not present

## 2020-11-15 DIAGNOSIS — I480 Paroxysmal atrial fibrillation: Secondary | ICD-10-CM | POA: Diagnosis not present

## 2020-11-15 DIAGNOSIS — E440 Moderate protein-calorie malnutrition: Secondary | ICD-10-CM | POA: Diagnosis not present

## 2020-11-15 DIAGNOSIS — G35 Multiple sclerosis: Secondary | ICD-10-CM | POA: Diagnosis not present

## 2020-11-15 DIAGNOSIS — N319 Neuromuscular dysfunction of bladder, unspecified: Secondary | ICD-10-CM | POA: Diagnosis not present

## 2020-11-15 DIAGNOSIS — R1310 Dysphagia, unspecified: Secondary | ICD-10-CM | POA: Diagnosis not present

## 2020-11-15 DIAGNOSIS — L89152 Pressure ulcer of sacral region, stage 2: Secondary | ICD-10-CM | POA: Diagnosis not present

## 2020-11-16 DIAGNOSIS — R1310 Dysphagia, unspecified: Secondary | ICD-10-CM | POA: Diagnosis not present

## 2020-11-16 DIAGNOSIS — L89152 Pressure ulcer of sacral region, stage 2: Secondary | ICD-10-CM | POA: Diagnosis not present

## 2020-11-16 DIAGNOSIS — E440 Moderate protein-calorie malnutrition: Secondary | ICD-10-CM | POA: Diagnosis not present

## 2020-11-16 DIAGNOSIS — G35 Multiple sclerosis: Secondary | ICD-10-CM | POA: Diagnosis not present

## 2020-11-16 DIAGNOSIS — N319 Neuromuscular dysfunction of bladder, unspecified: Secondary | ICD-10-CM | POA: Diagnosis not present

## 2020-11-16 DIAGNOSIS — I480 Paroxysmal atrial fibrillation: Secondary | ICD-10-CM | POA: Diagnosis not present

## 2020-11-17 DIAGNOSIS — R1310 Dysphagia, unspecified: Secondary | ICD-10-CM | POA: Diagnosis not present

## 2020-11-17 DIAGNOSIS — L89152 Pressure ulcer of sacral region, stage 2: Secondary | ICD-10-CM | POA: Diagnosis not present

## 2020-11-17 DIAGNOSIS — E440 Moderate protein-calorie malnutrition: Secondary | ICD-10-CM | POA: Diagnosis not present

## 2020-11-17 DIAGNOSIS — N319 Neuromuscular dysfunction of bladder, unspecified: Secondary | ICD-10-CM | POA: Diagnosis not present

## 2020-11-17 DIAGNOSIS — G35 Multiple sclerosis: Secondary | ICD-10-CM | POA: Diagnosis not present

## 2020-11-17 DIAGNOSIS — I480 Paroxysmal atrial fibrillation: Secondary | ICD-10-CM | POA: Diagnosis not present

## 2020-11-18 DIAGNOSIS — L89152 Pressure ulcer of sacral region, stage 2: Secondary | ICD-10-CM | POA: Diagnosis not present

## 2020-11-18 DIAGNOSIS — G35 Multiple sclerosis: Secondary | ICD-10-CM | POA: Diagnosis not present

## 2020-11-18 DIAGNOSIS — N319 Neuromuscular dysfunction of bladder, unspecified: Secondary | ICD-10-CM | POA: Diagnosis not present

## 2020-11-18 DIAGNOSIS — R1310 Dysphagia, unspecified: Secondary | ICD-10-CM | POA: Diagnosis not present

## 2020-11-18 DIAGNOSIS — E440 Moderate protein-calorie malnutrition: Secondary | ICD-10-CM | POA: Diagnosis not present

## 2020-11-18 DIAGNOSIS — I480 Paroxysmal atrial fibrillation: Secondary | ICD-10-CM | POA: Diagnosis not present

## 2020-11-22 DEATH — deceased

## 2020-11-28 ENCOUNTER — Telehealth: Payer: Self-pay

## 2020-11-28 NOTE — Telephone Encounter (Signed)
Notification from Patient Ping.  Patient expired on 10-Dec-2020 at [facility] the [place].
